# Patient Record
Sex: Male | Born: 1935
Health system: Southern US, Community
[De-identification: ages and names within clinical notes are randomized; demographics above are authoritative.]

## PROBLEM LIST (undated history)

## (undated) DIAGNOSIS — N183 Chronic kidney disease, stage 3 unspecified: Secondary | ICD-10-CM

## (undated) DIAGNOSIS — M1990 Unspecified osteoarthritis, unspecified site: Secondary | ICD-10-CM

## (undated) DIAGNOSIS — I10 Essential (primary) hypertension: Secondary | ICD-10-CM

## (undated) DIAGNOSIS — Z8719 Personal history of other diseases of the digestive system: Secondary | ICD-10-CM

## (undated) DIAGNOSIS — D509 Iron deficiency anemia, unspecified: Secondary | ICD-10-CM

## (undated) DIAGNOSIS — G4730 Sleep apnea, unspecified: Secondary | ICD-10-CM

## (undated) DIAGNOSIS — I701 Atherosclerosis of renal artery: Secondary | ICD-10-CM

## (undated) DIAGNOSIS — E7800 Pure hypercholesterolemia, unspecified: Secondary | ICD-10-CM

## (undated) DIAGNOSIS — J45909 Unspecified asthma, uncomplicated: Secondary | ICD-10-CM

## (undated) DIAGNOSIS — K219 Gastro-esophageal reflux disease without esophagitis: Secondary | ICD-10-CM

## (undated) DIAGNOSIS — I350 Nonrheumatic aortic (valve) stenosis: Secondary | ICD-10-CM

## (undated) DIAGNOSIS — I480 Paroxysmal atrial fibrillation: Secondary | ICD-10-CM

## (undated) DIAGNOSIS — I2510 Atherosclerotic heart disease of native coronary artery without angina pectoris: Secondary | ICD-10-CM

## (undated) HISTORY — DX: Chronic kidney disease, stage 3 (moderate): N18.3

## (undated) HISTORY — PX: INGUINAL HERNIA REPAIR: SUR1180

## (undated) HISTORY — DX: Chronic kidney disease, stage 3 unspecified: N18.30

## (undated) HISTORY — DX: Atherosclerosis of renal artery: I70.1

## (undated) HISTORY — PX: CATARACT EXTRACTION W/ INTRAOCULAR LENS IMPLANT: SHX1309

## (undated) HISTORY — DX: Unspecified asthma, uncomplicated: J45.909

## (undated) HISTORY — PX: CORONARY ANGIOPLASTY: SHX604

## (undated) HISTORY — DX: Paroxysmal atrial fibrillation: I48.0

## (undated) HISTORY — PX: TONSILLECTOMY AND ADENOIDECTOMY: SUR1326

---

## 1992-05-11 HISTORY — PX: CORONARY ARTERY BYPASS GRAFT: SHX141

## 1997-11-21 ENCOUNTER — Ambulatory Visit (HOSPITAL_COMMUNITY): Admission: RE | Admit: 1997-11-21 | Discharge: 1997-11-21 | Payer: Self-pay | Admitting: Gastroenterology

## 2001-11-04 ENCOUNTER — Ambulatory Visit (HOSPITAL_COMMUNITY): Admission: RE | Admit: 2001-11-04 | Discharge: 2001-11-04 | Payer: Self-pay | Admitting: Gastroenterology

## 2004-02-27 HISTORY — PX: CARDIAC CATHETERIZATION: SHX172

## 2004-03-04 ENCOUNTER — Ambulatory Visit (HOSPITAL_COMMUNITY): Admission: RE | Admit: 2004-03-04 | Discharge: 2004-03-05 | Payer: Self-pay | Admitting: Cardiovascular Disease

## 2004-03-04 HISTORY — PX: CORONARY ANGIOPLASTY WITH STENT PLACEMENT: SHX49

## 2004-09-11 ENCOUNTER — Emergency Department (HOSPITAL_COMMUNITY): Admission: EM | Admit: 2004-09-11 | Discharge: 2004-09-12 | Payer: Self-pay | Admitting: Emergency Medicine

## 2004-09-11 IMAGING — CR DG ANKLE COMPLETE 3+V*L*
3 series · 3 of 3 positions shown · non-contrast
Comparison: none

CLINICAL DATA: Twisted ankle. Pain.
 LEFT [J4] VIEW:
 There is some mild soft tissue swelling about the ankle.  No fracture, dislocation or joint effusion.  Atherosclerotic calcifications are noted. Well formed plantar calcaneal spur.

[view not recorded (1 of 3)]
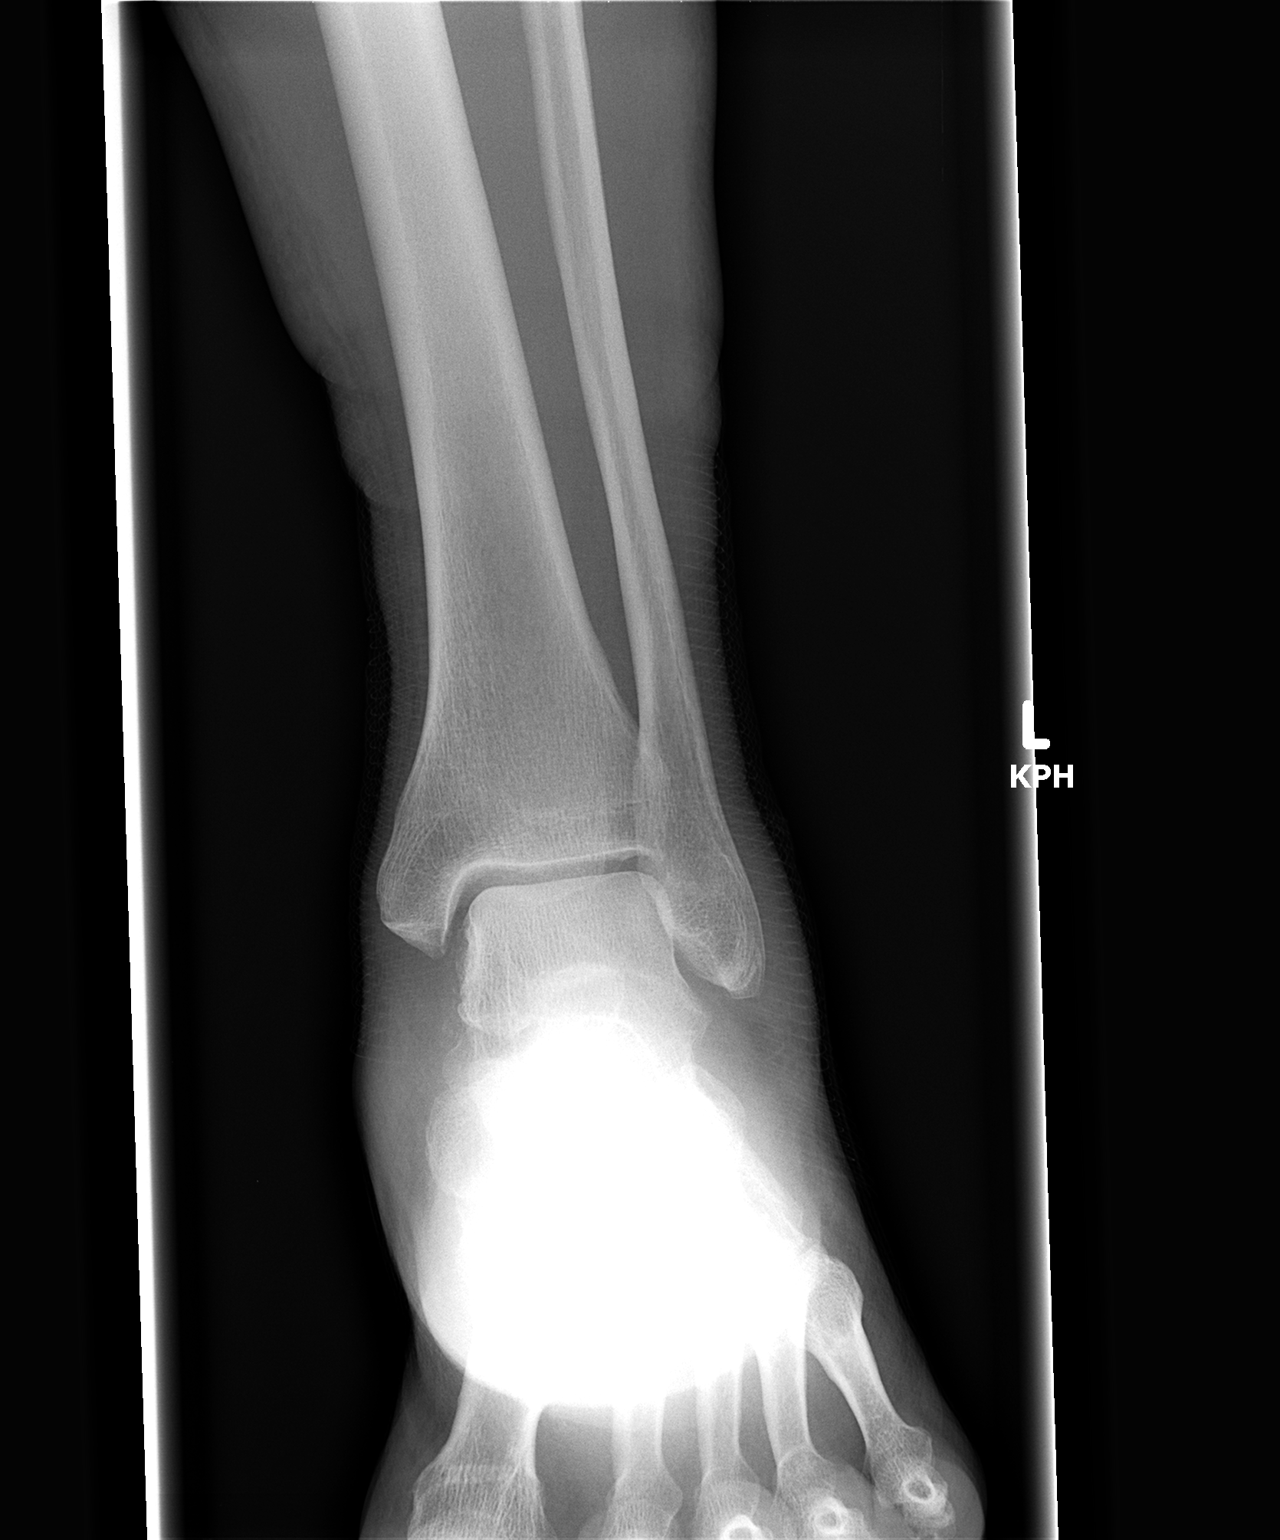

[view not recorded (2 of 3)]
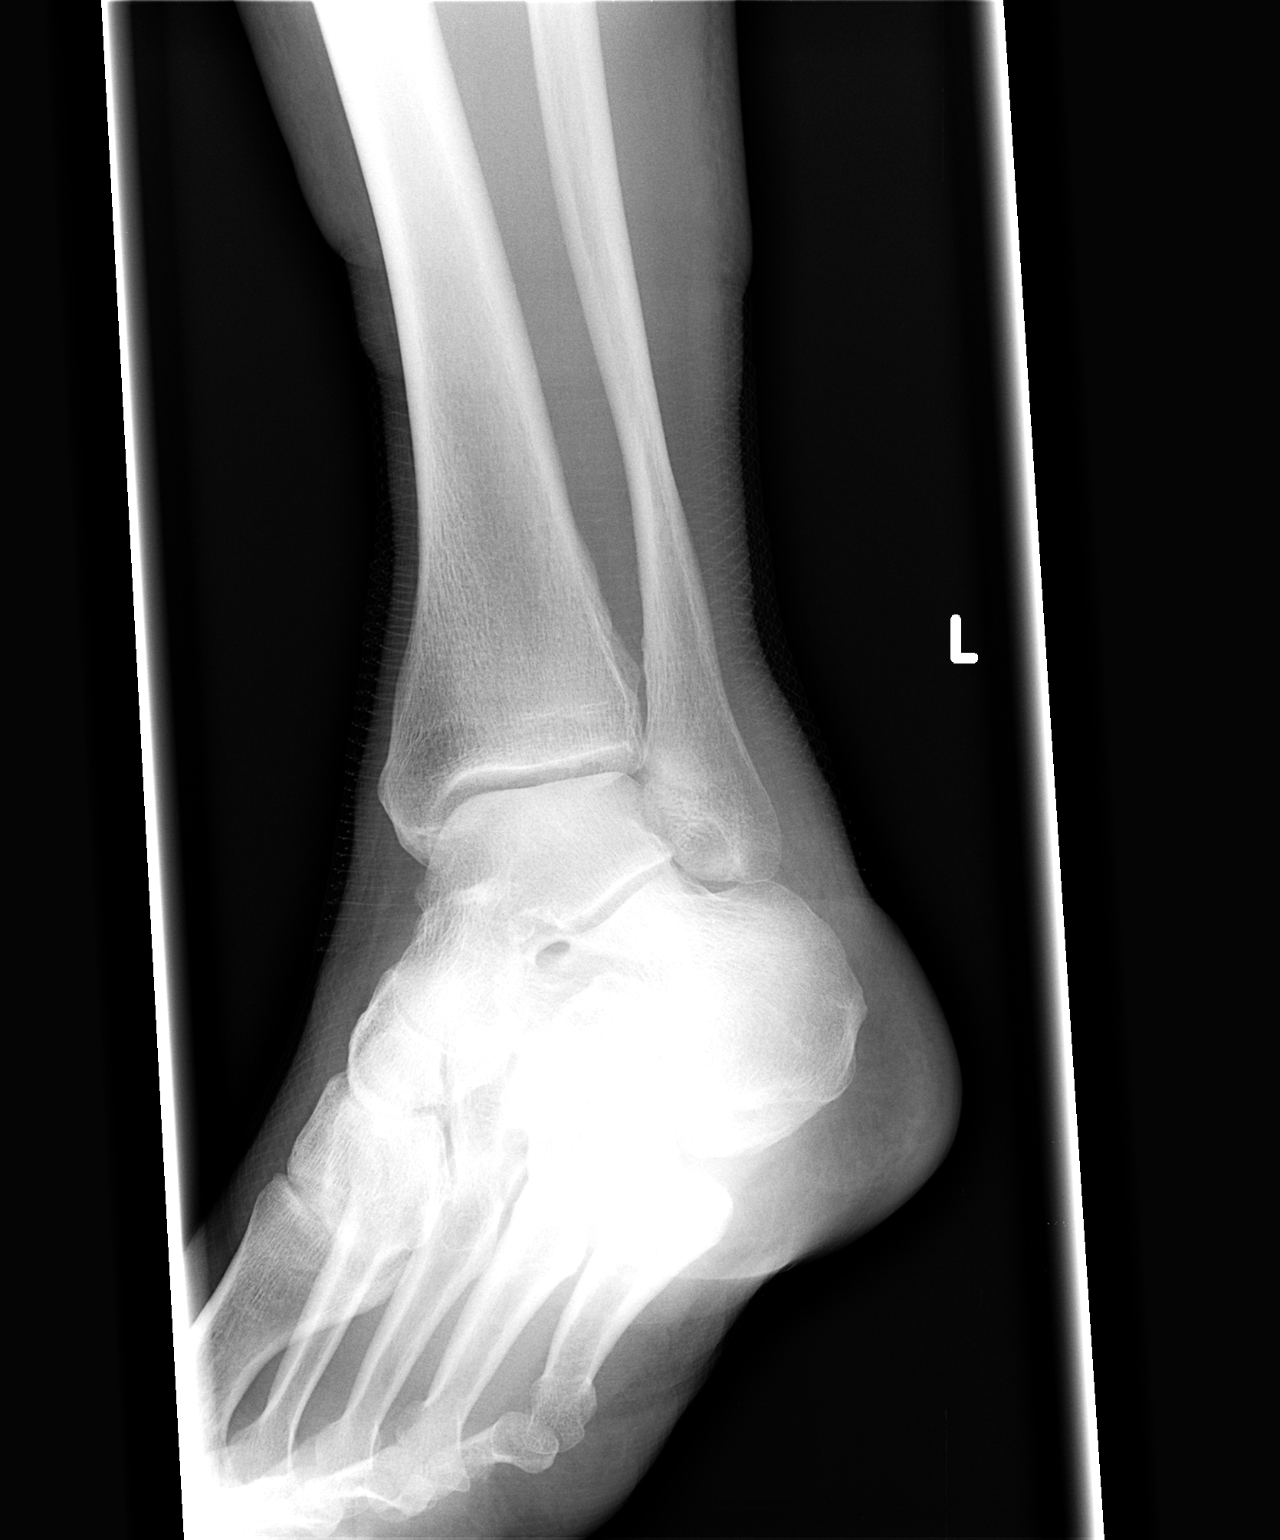

[view not recorded (3 of 3)]
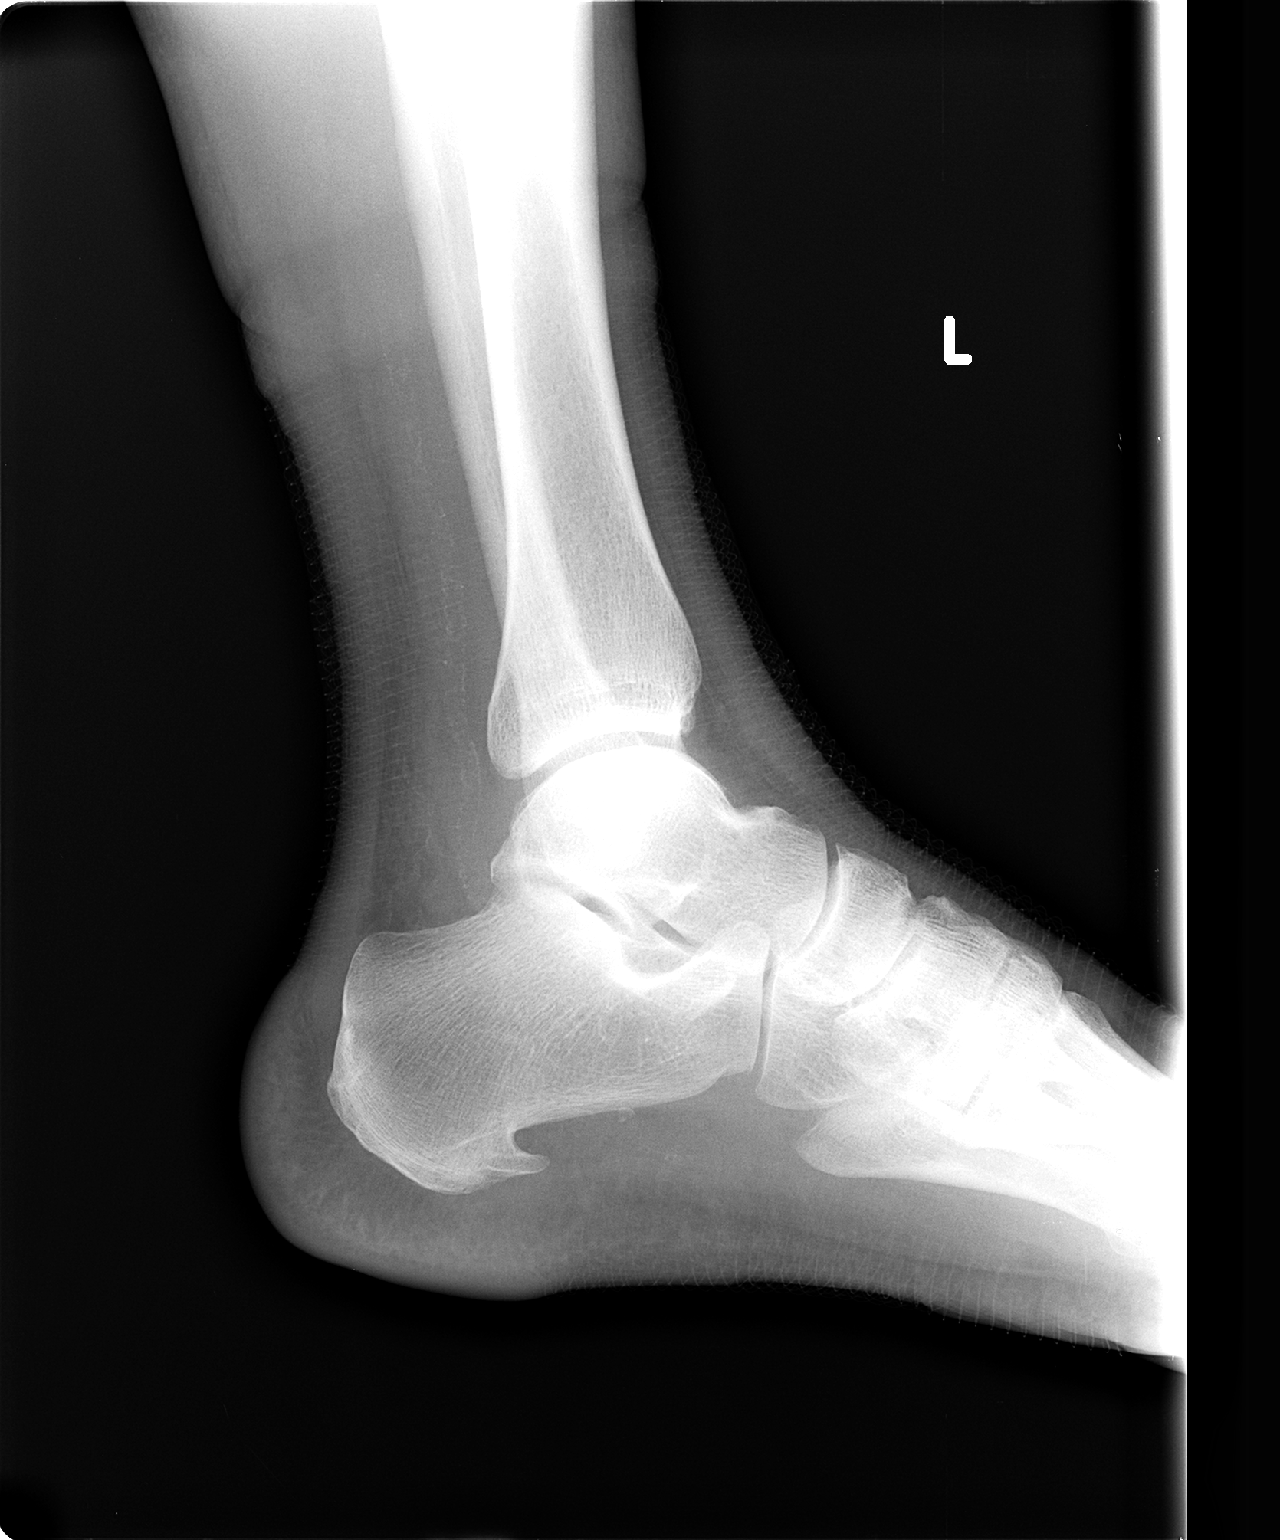

[3 of 3 positions shown; findings below may reference images not displayed]

IMPRESSION: Soft tissue swelling about the ankle without underlying fracture.

## 2005-02-07 IMAGING — CR DG CHEST 2V
2 series · 2 of 2 positions shown · non-contrast
Comparison: [DATE].

CLINICAL DATA: Preop respiratory exam for right inguinal hernia repair.  History of open heart surgery.  Hypertension.
 TWO VIEW CHEST:

[w chest pa]
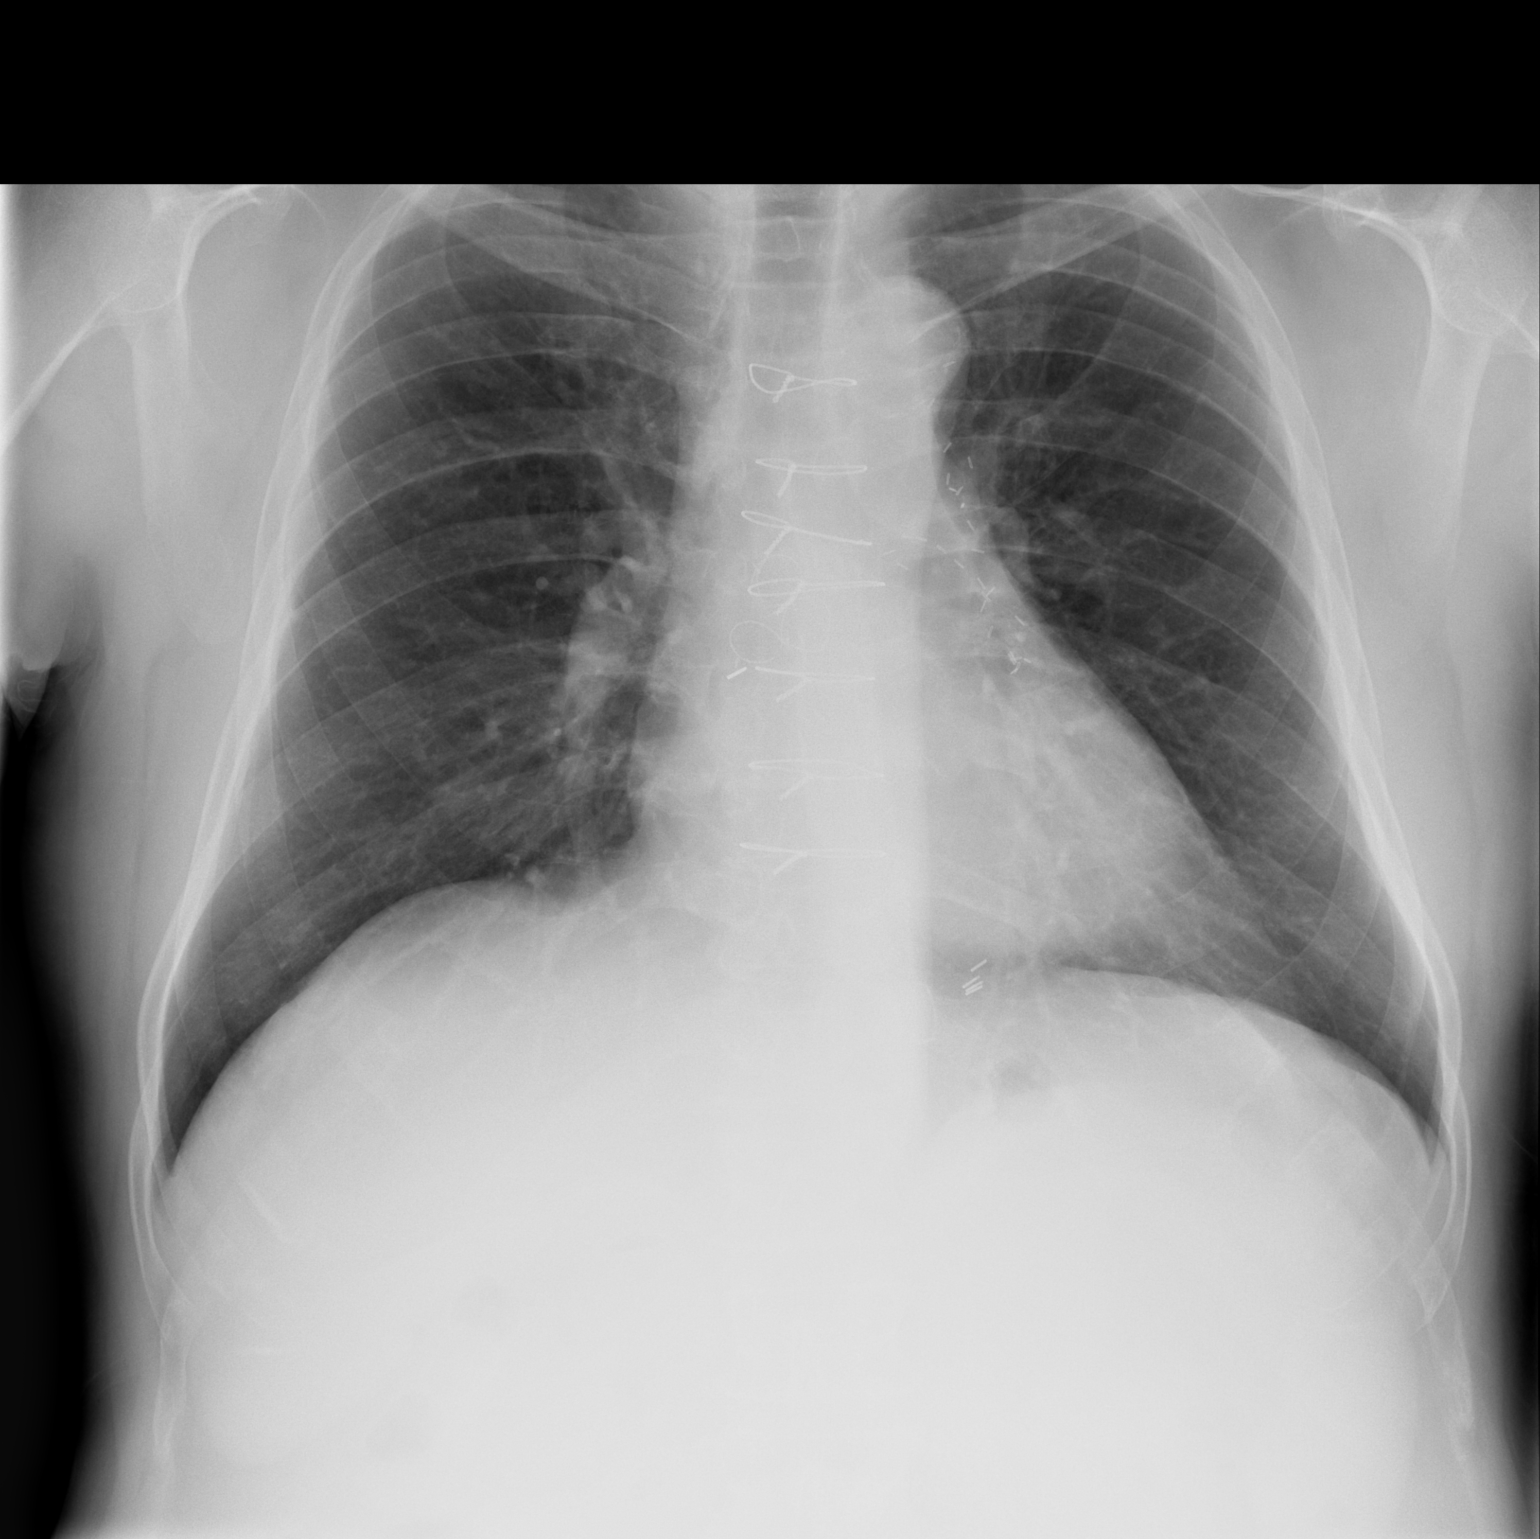

[w chest lat]
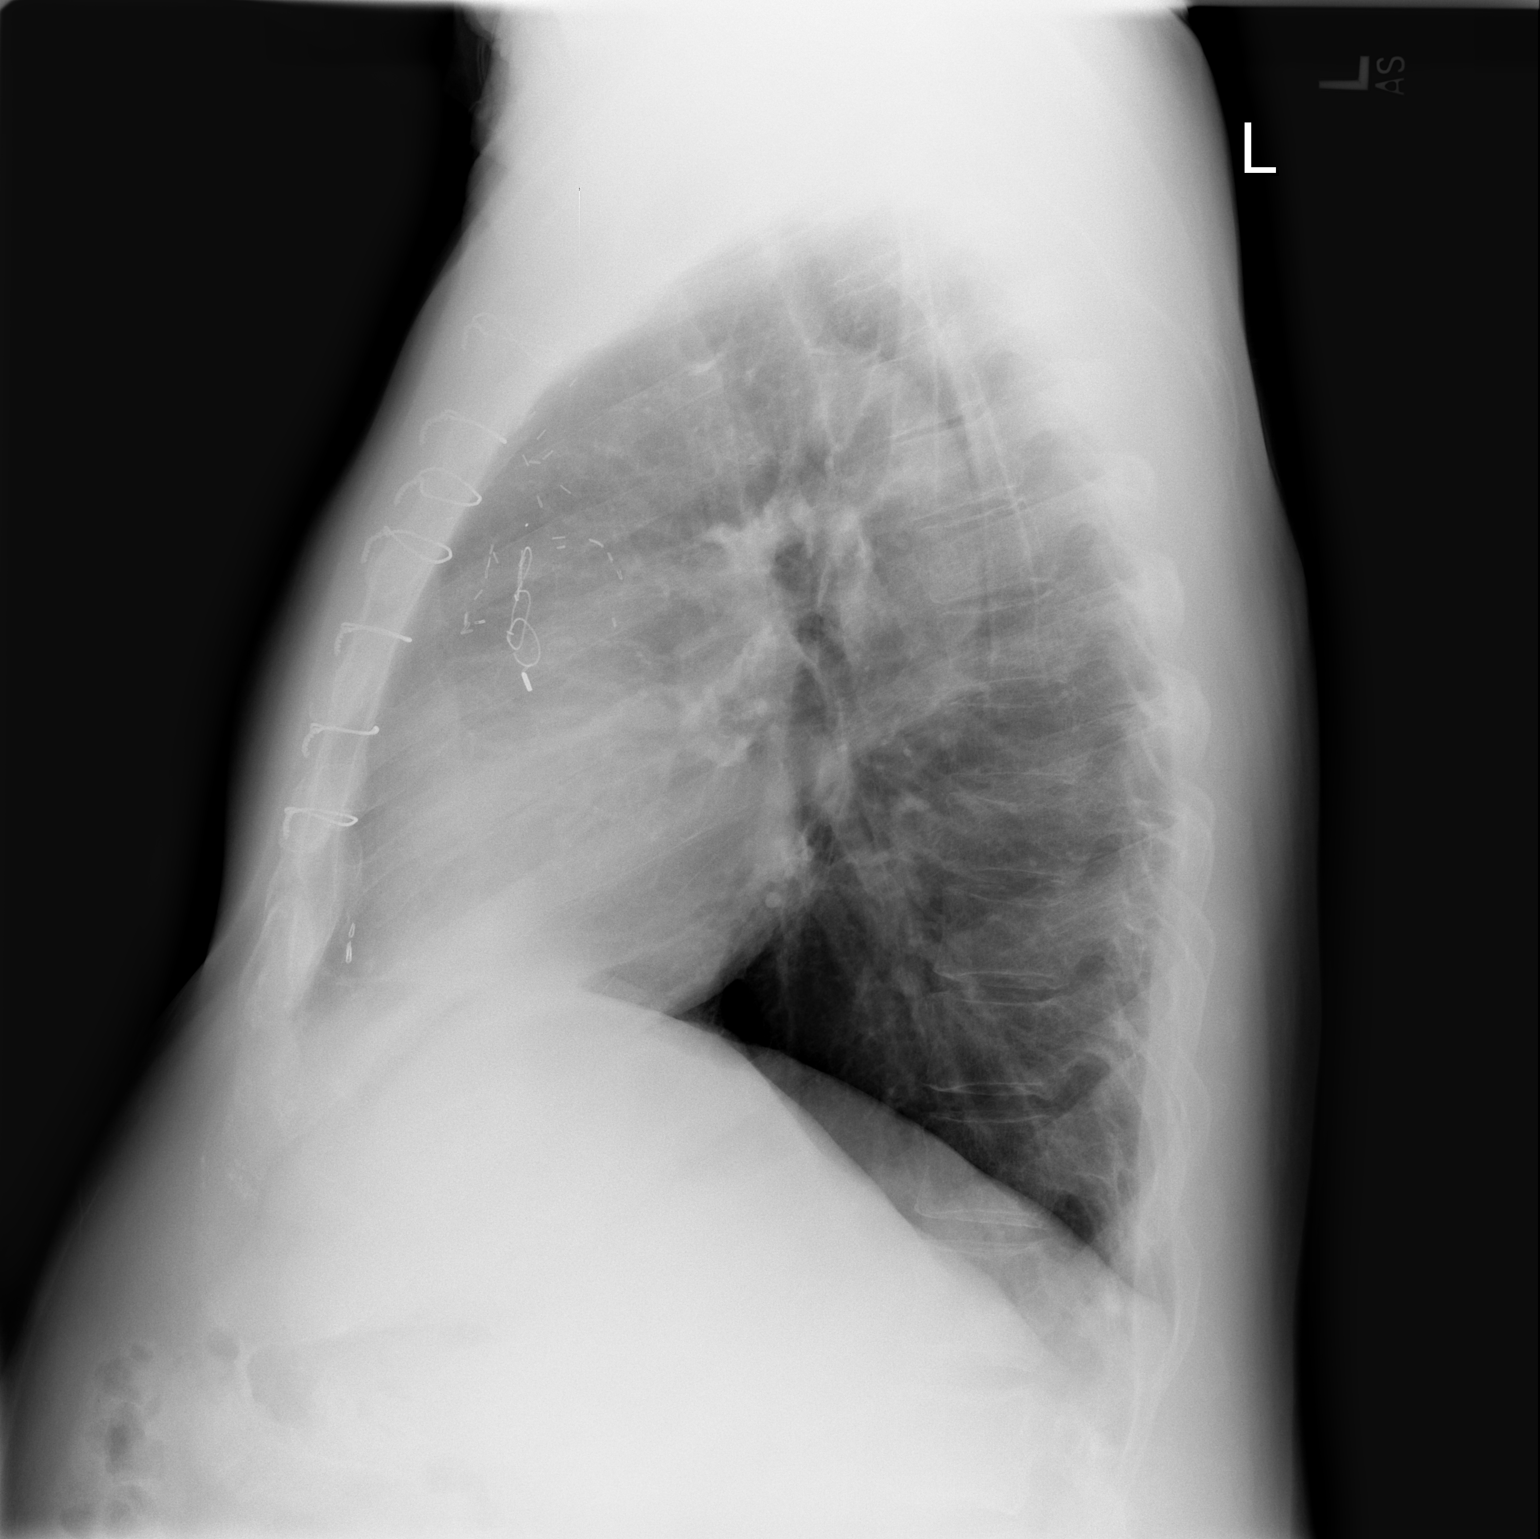

[2 of 2 positions shown; findings below may reference images not displayed]

FINDINGS: Midline trachea.  Heart upper limits of normal for size.  Status post median sternotomy and CABG.  Costophrenic angles are sharp.  The lungs are clear.
IMPRESSION: No acute cardiopulmonary disease.

## 2005-09-30 ENCOUNTER — Emergency Department (HOSPITAL_COMMUNITY): Admission: EM | Admit: 2005-09-30 | Discharge: 2005-10-01 | Payer: Self-pay | Admitting: *Deleted

## 2006-04-19 ENCOUNTER — Encounter: Admission: RE | Admit: 2006-04-19 | Discharge: 2006-04-19 | Payer: Self-pay | Admitting: General Surgery

## 2006-04-20 ENCOUNTER — Ambulatory Visit (HOSPITAL_BASED_OUTPATIENT_CLINIC_OR_DEPARTMENT_OTHER): Admission: RE | Admit: 2006-04-20 | Discharge: 2006-04-20 | Payer: Self-pay | Admitting: General Surgery

## 2006-05-24 ENCOUNTER — Encounter: Admission: RE | Admit: 2006-05-24 | Discharge: 2006-05-24 | Payer: Self-pay | Admitting: Internal Medicine

## 2006-05-24 IMAGING — CR DG PELVIS 1-2V
1 series · 1 of 1 positions shown · non-contrast
Comparison: none

CLINICAL DATA: Pain 
 LEFT HIP - 2 VIEW:

[t pelvis a.p.]
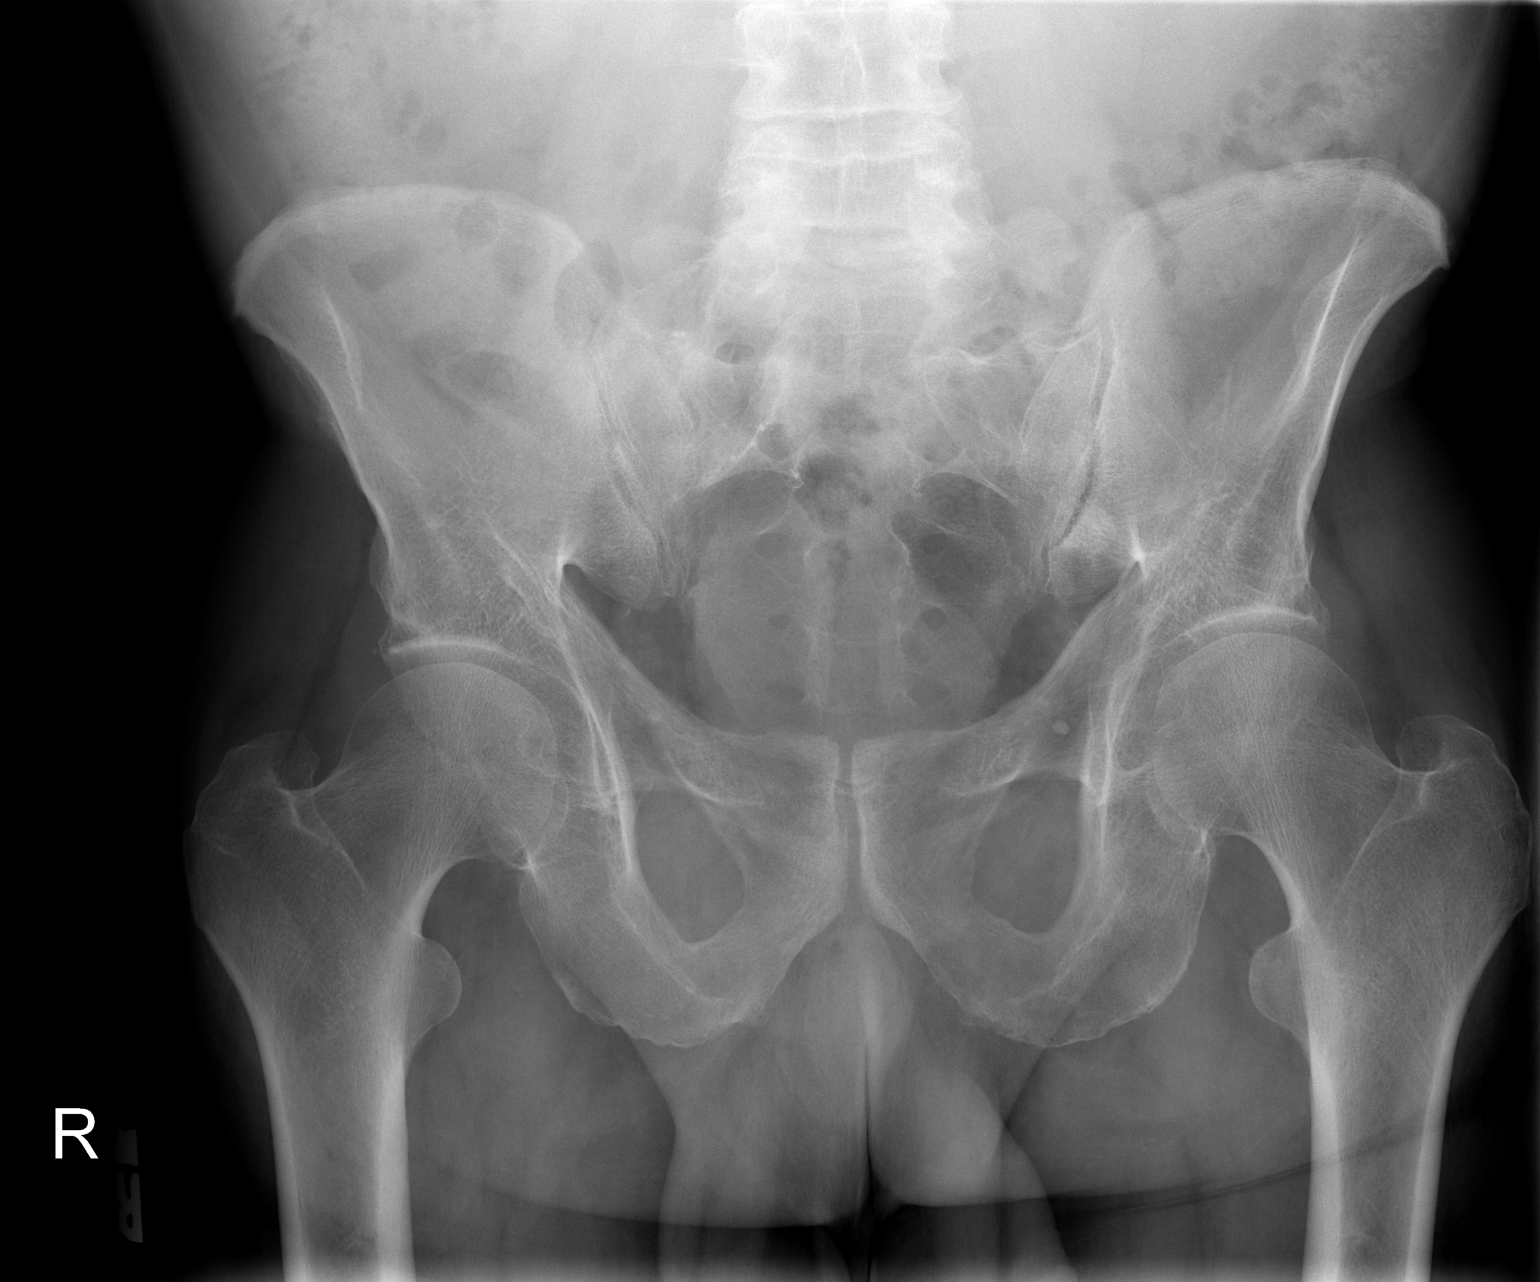

[1 of 1 positions shown; findings below may reference images not displayed]

FINDINGS: Two views of the left hip were obtained.  The left hip joint space is within normal limits for age and no acute bony abnormality is seen.  The bones are somewhat osteopenic.
IMPRESSION: No acute abnormality.

 PELVIS - 1 VIEW:
FINDINGS: A single view of the pelvis shows the bones to be osteopenic.  Both hip joint spaces are normal.  The pelvic rami are intact, and the SI joints appear normal.
IMPRESSION: No acute bony abnormality.  Degenerative change in the lower lumbar spine.

## 2006-05-24 IMAGING — CR DG HIP (WITH OR WITHOUT PELVIS) 2-3V*L*
2 series · 2 of 2 positions shown · non-contrast
Comparison: none

CLINICAL DATA: Pain 
 LEFT HIP - 2 VIEW:

[t hip ap left]
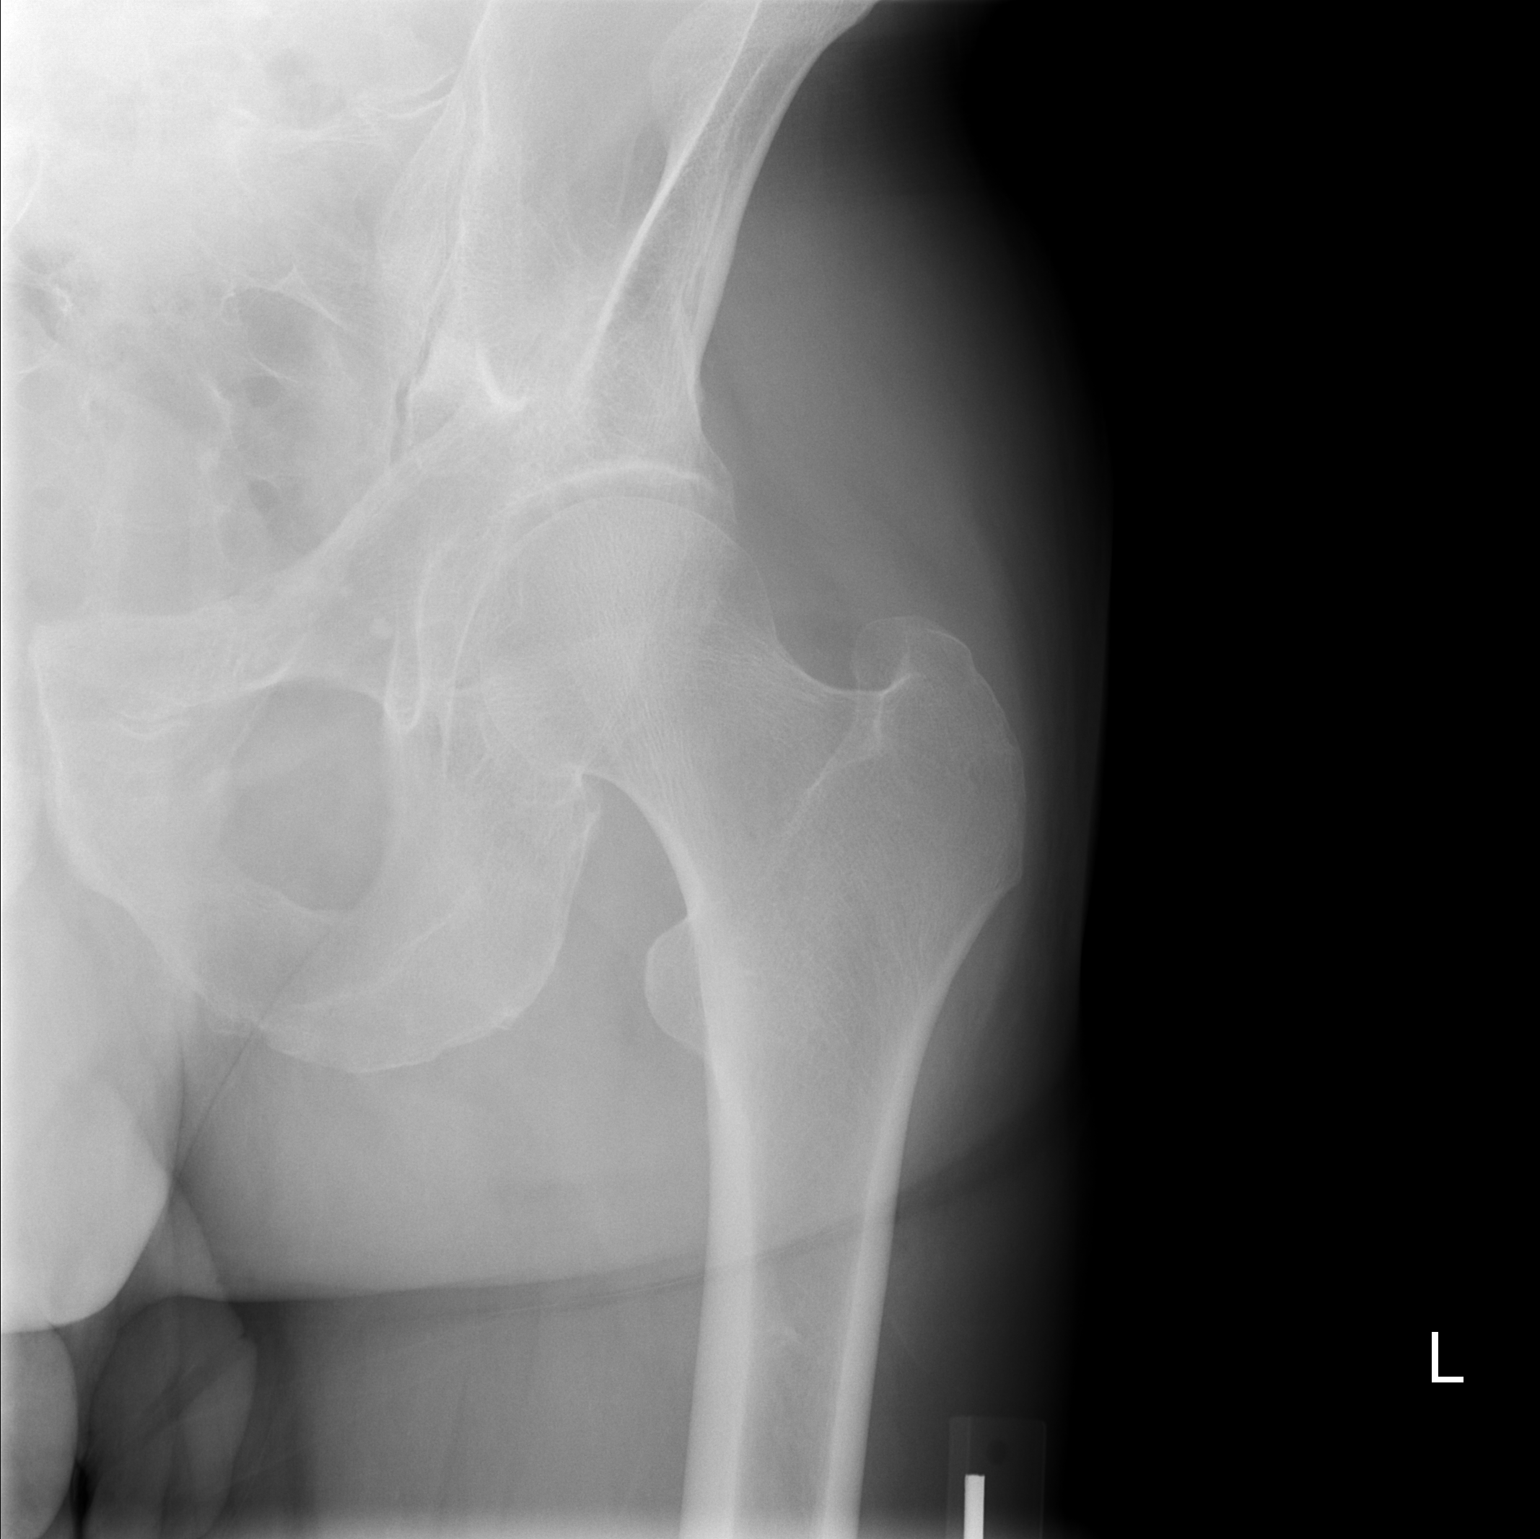

[t hip frog leg left]
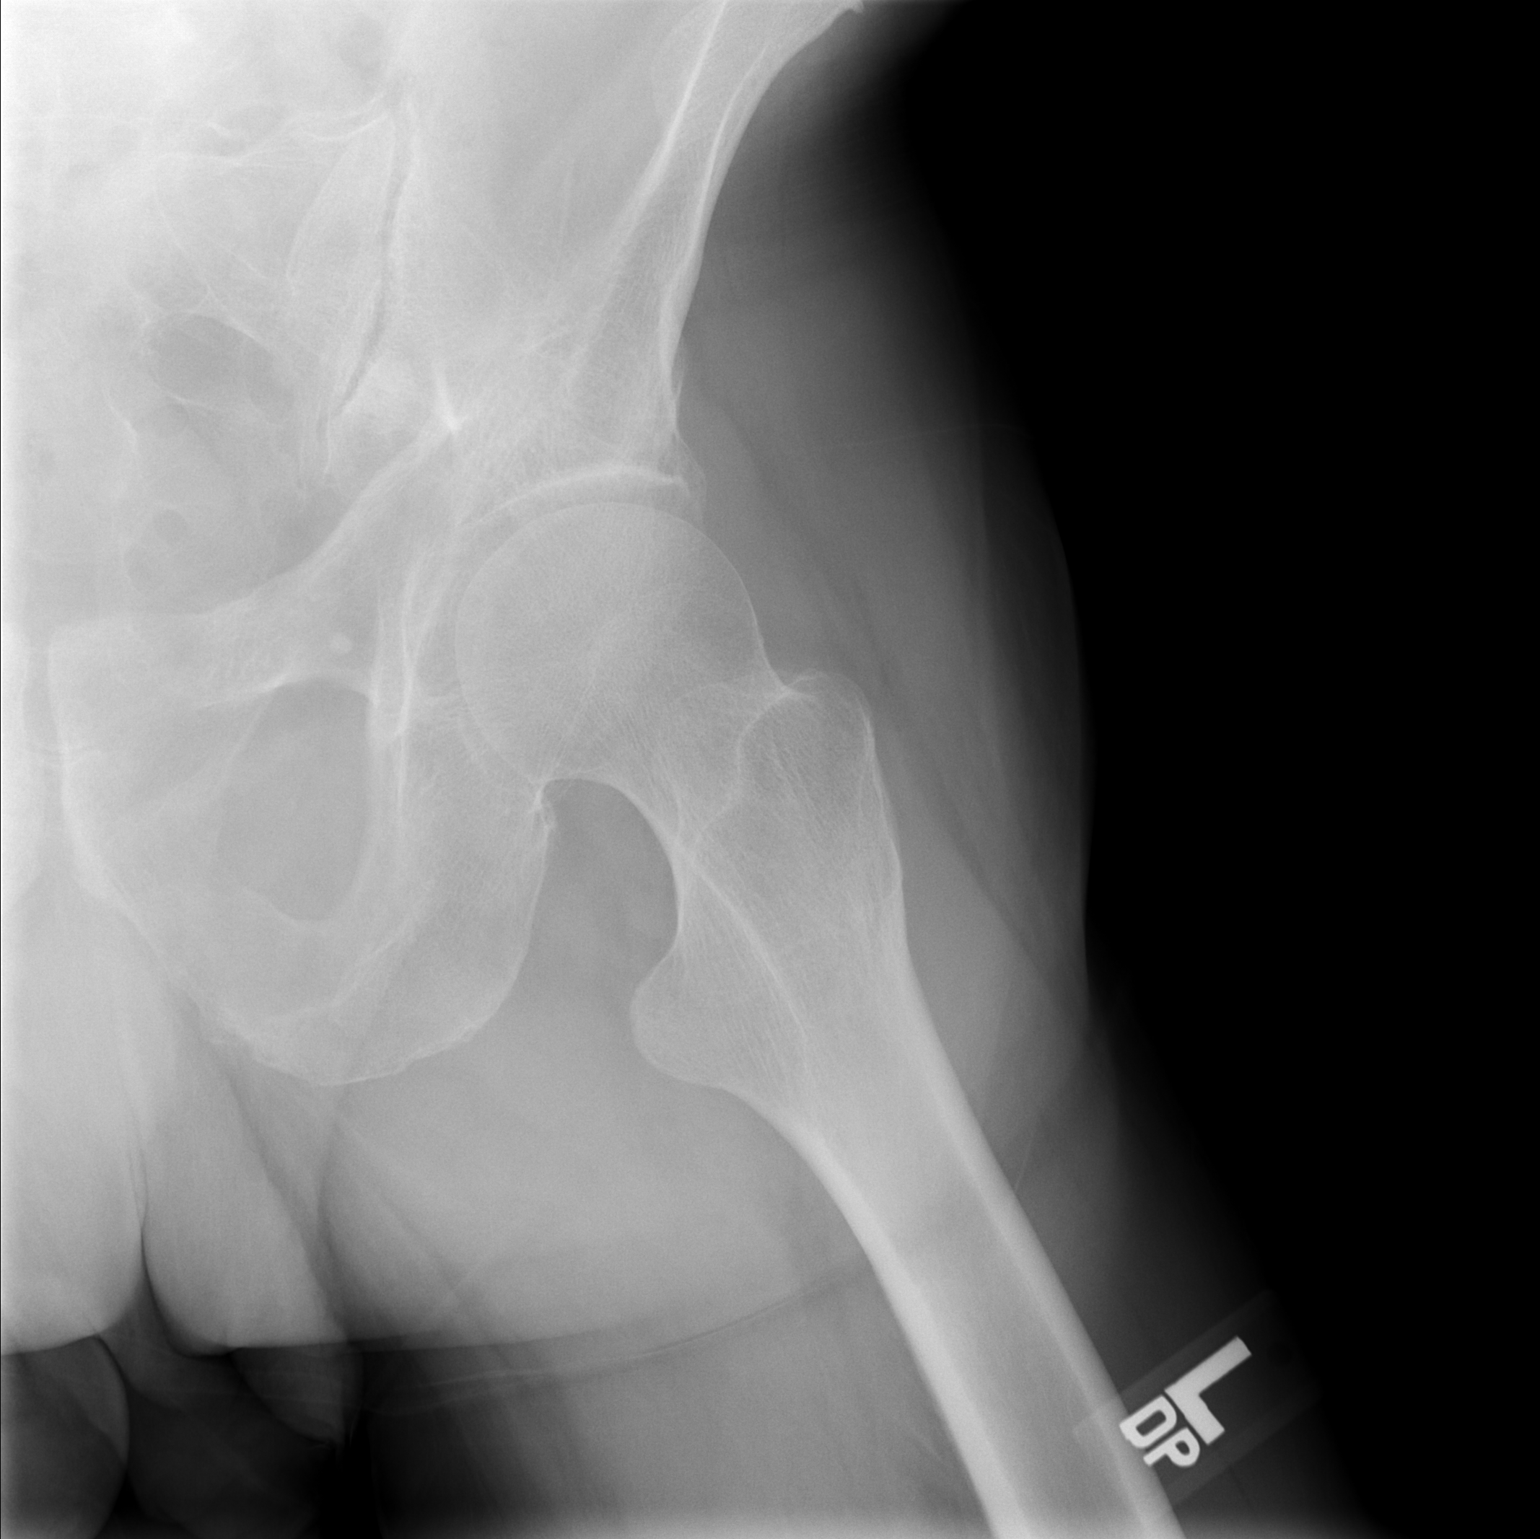

[2 of 2 positions shown; findings below may reference images not displayed]

FINDINGS: Two views of the left hip were obtained.  The left hip joint space is within normal limits for age and no acute bony abnormality is seen.  The bones are somewhat osteopenic.
IMPRESSION: No acute abnormality.

 PELVIS - 1 VIEW:
FINDINGS: A single view of the pelvis shows the bones to be osteopenic.  Both hip joint spaces are normal.  The pelvic rami are intact, and the SI joints appear normal.
IMPRESSION: No acute bony abnormality.  Degenerative change in the lower lumbar spine.

## 2009-05-23 ENCOUNTER — Observation Stay (HOSPITAL_COMMUNITY): Admission: EM | Admit: 2009-05-23 | Discharge: 2009-05-25 | Payer: Self-pay | Admitting: Emergency Medicine

## 2009-05-23 IMAGING — CT CT HEAD W/O CM
4 of 5 series · 16 of 47 positions shown, 18 images · non-contrast
Comparison: None.

CT HEAD

CLINICAL DATA: Fell and hit head.  Nausea.

CT HEAD WITHOUT CONTRAST
CT CERVICAL SPINE WITHOUT CONTRAST
TECHNIQUE: Multidetector CT imaging of the head and cervical spine
was performed following the standard protocol without intravenous
contrast.  Multiplanar CT image reconstructions of the cervical
spine were also generated.

[Series 3: head trauma 4.8 h37s · axial · 0.47mm/px · z∈[-114,-65]mm · 2 of 30 slices shown]
[im 10/30  brain]
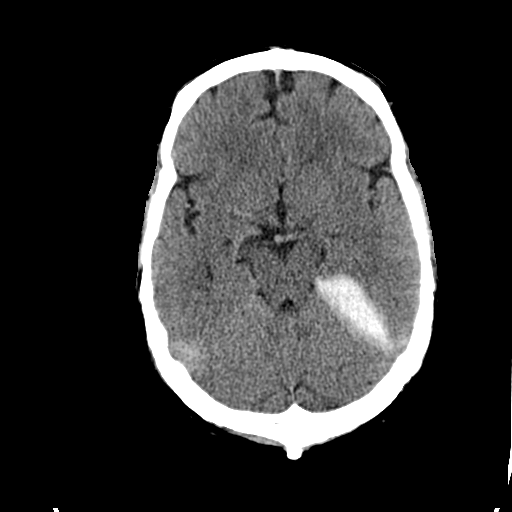
[im 20/30  brain]
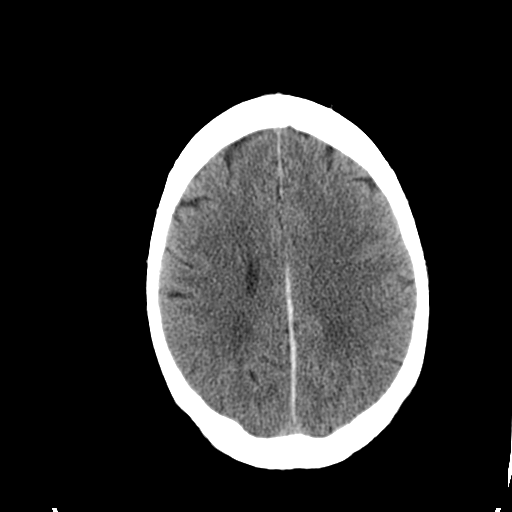

[Series 4: head trauma 2.4 h60s · axial · 0.47mm/px · z∈[-145,-32]mm · 8 of 60 slices shown, 10 images]
[im 7/60  brain]
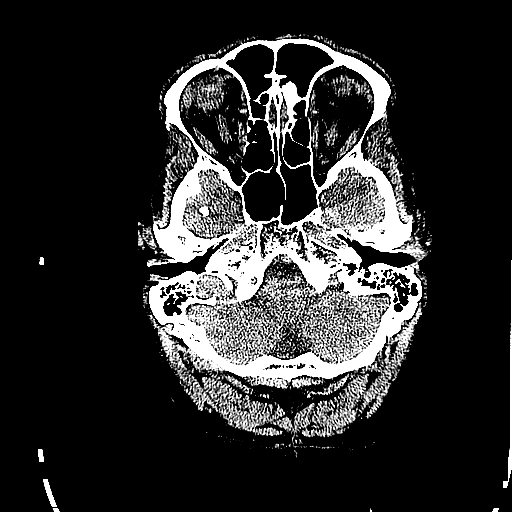
[im 7/60  bone]
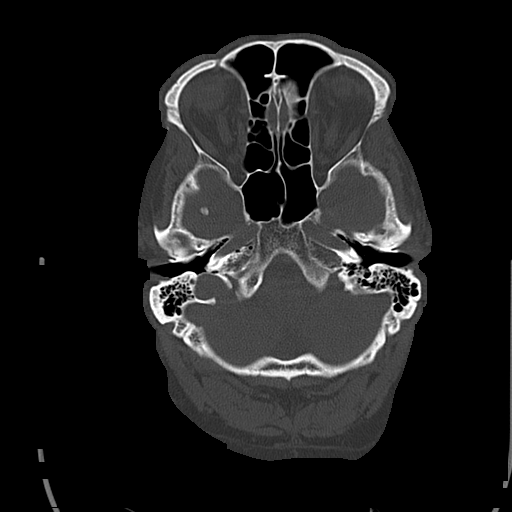
[im 14/60  brain]
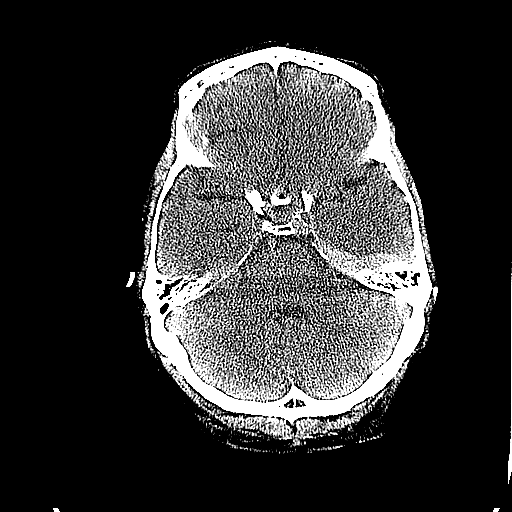
[im 20/60  brain]
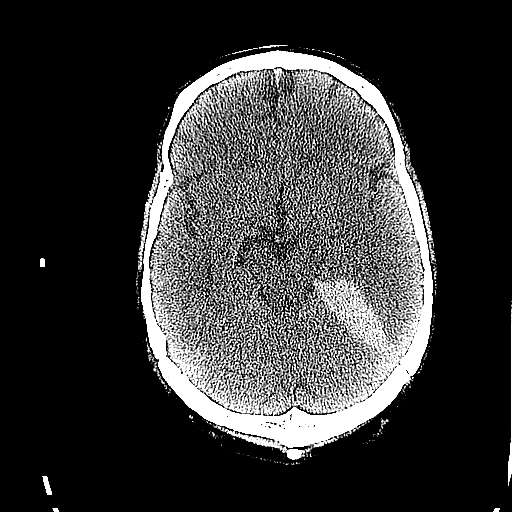
[im 27/60  brain]
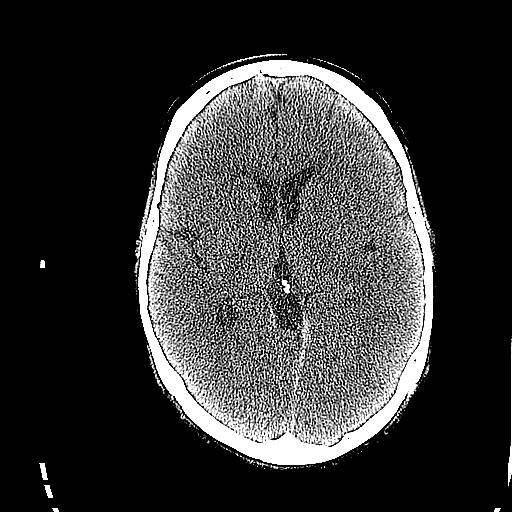
[im 33/60  brain]
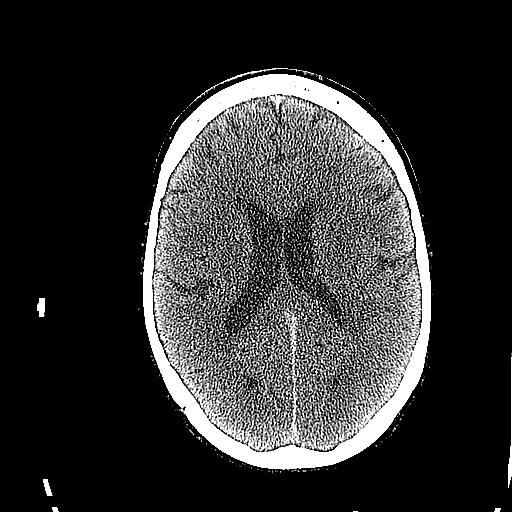
[im 33/60  bone]
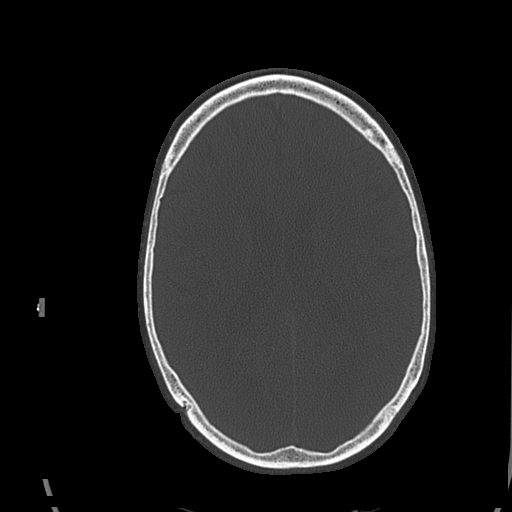
[im 40/60  brain]
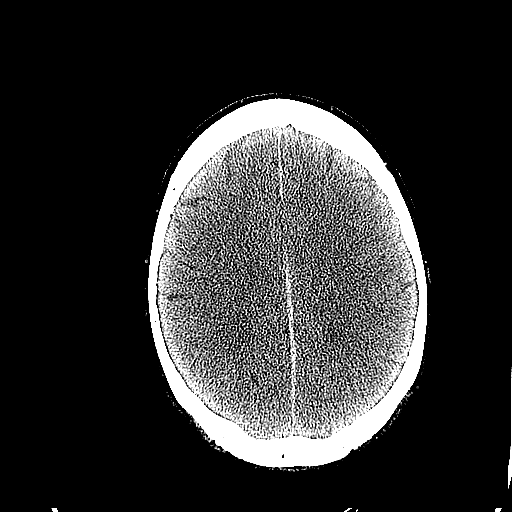
[im 46/60  brain]
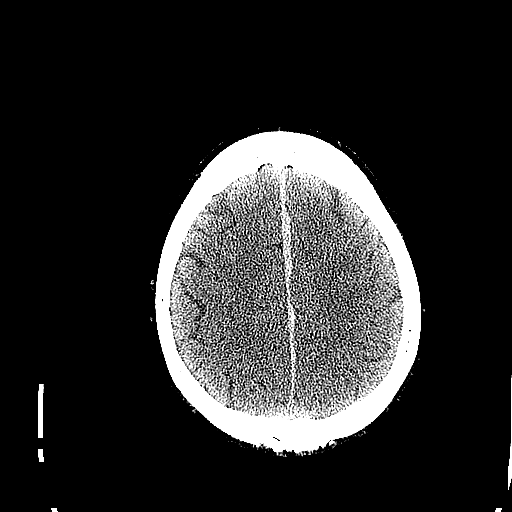
[im 53/60  brain]
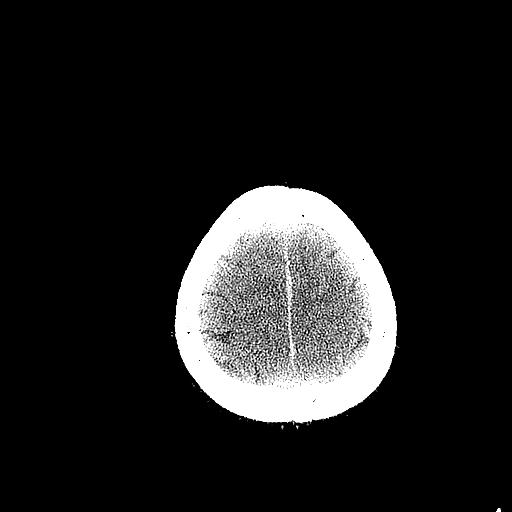

[Series 602: cor bone · coronal · 0.38mm/px · 3 of 55 slices shown]
[im 19/55  brain]
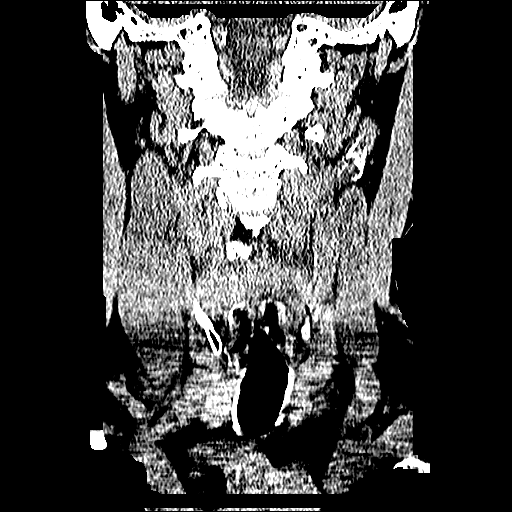
[im 25/55  brain]
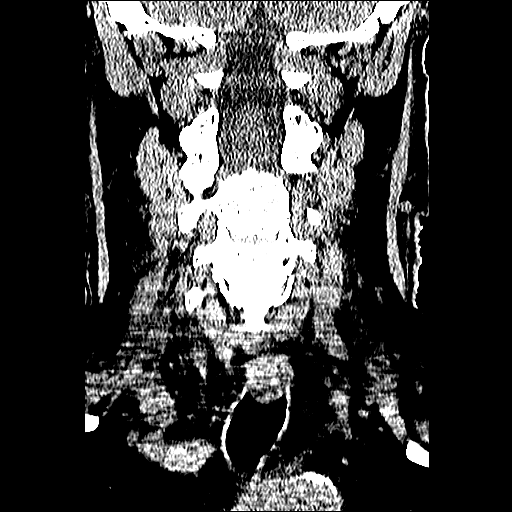
[im 31/55  brain]
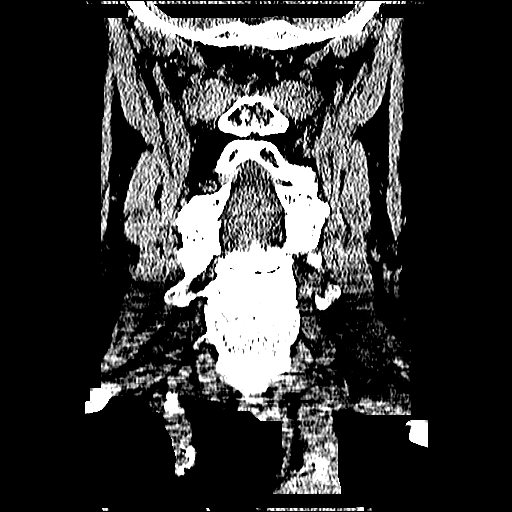

[Series 603: sag bone · sagittal · 0.38mm/px · 3 of 61 slices shown]
[im 21/61  brain]
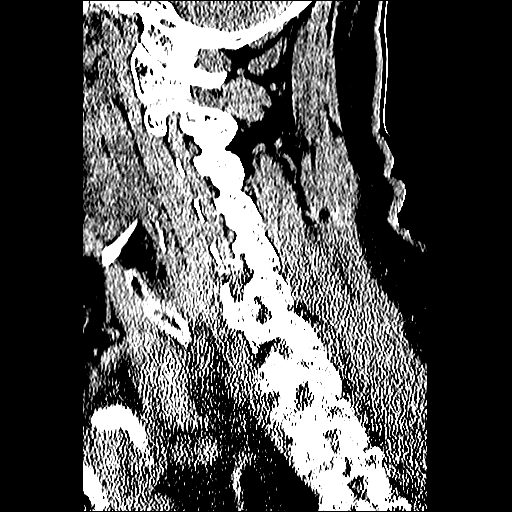
[im 31/61  brain]
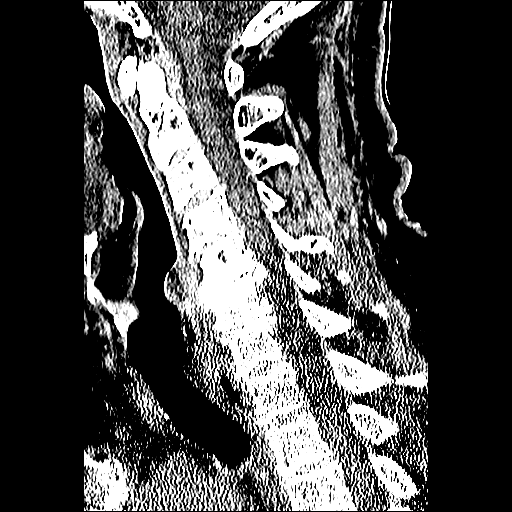
[im 41/61  brain]
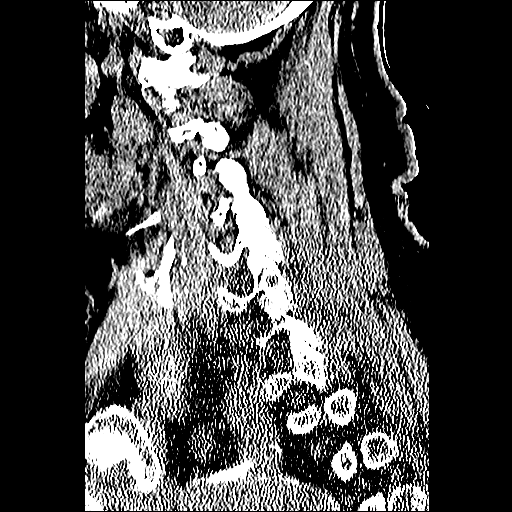

[16 of 47 positions shown; findings below may reference images not displayed]

FINDINGS: Left peritentorial extra-axial hematoma compatible with a
subdural hematoma.  Thin subdural hematoma in the interhemispheric
fissure superiorly.  No midline shift.  No parenchymal hemorrhage.
Chronic small vessel disease of the cerebral deep white matter.
Bony calvarium is intact.
IMPRESSION: Left peritentorial SDH.  SDH in the interhemispheric fissure.

CT CERVICAL SPINE
FINDINGS: No fracture or subluxation.  Advanced degenerative disc
disease at C5-6. Corticated bony density paracentrally on the left
at C5-6 related to spondylosis. Less severe DDD at C6-7.
IMPRESSION: No fracture/subluxation.  Spondylosis.

## 2009-05-24 IMAGING — CT CT HEAD W/O CM
1 of 2 series · 13 of 30 positions shown, 17 images · non-contrast
Comparison: [DATE]

CLINICAL DATA: Follow-up subdural hematoma

CT HEAD WITHOUT CONTRAST
TECHNIQUE: Contiguous axial images were obtained from the base of
the skull through the vertex without contrast.

[Series 2: brain · axial · 0.47mm/px · z∈[+142,+266]mm · 13 of 28 slices shown, 17 images]
[im 2/28  brain]
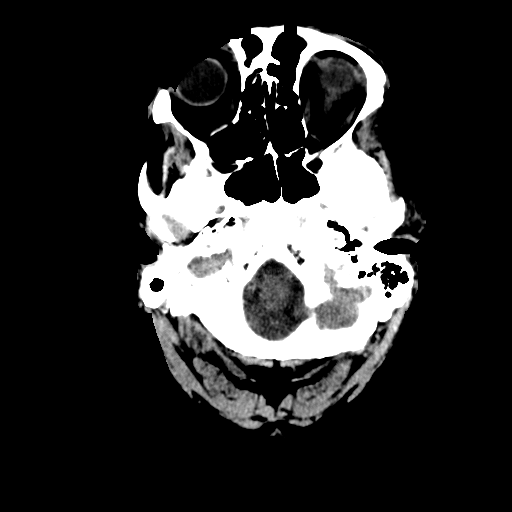
[im 2/28  bone]
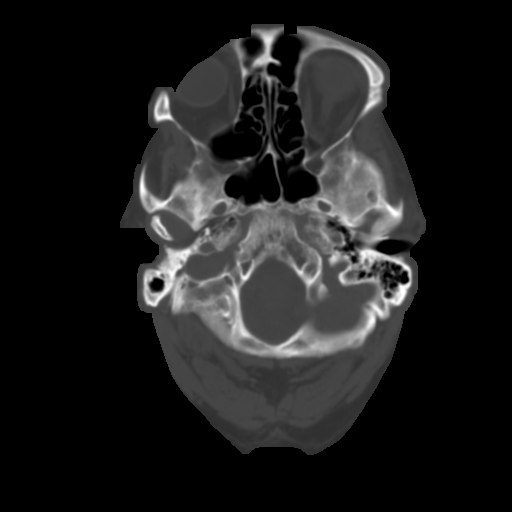
[im 4/28  brain]
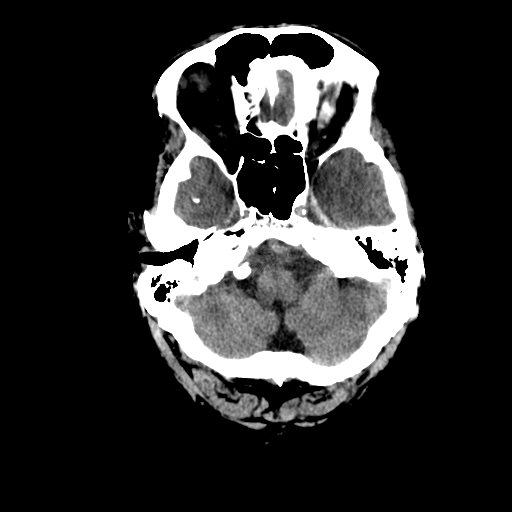
[im 6/28  brain]
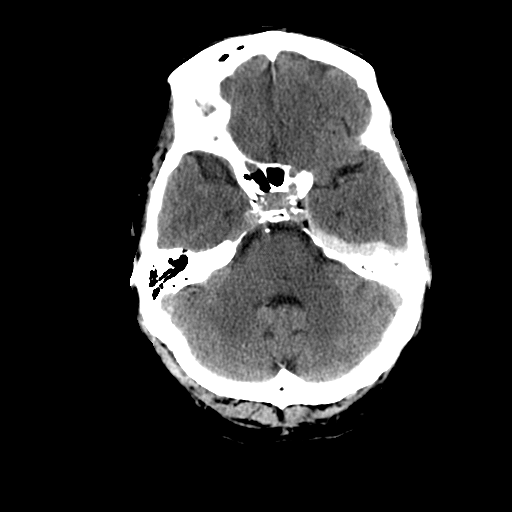
[im 8/28  brain]
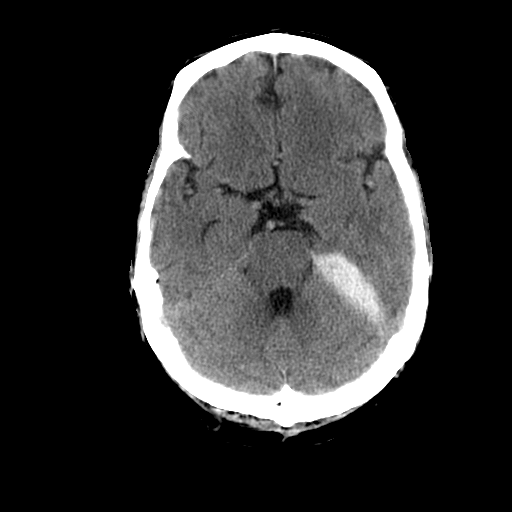
[im 10/28  brain]
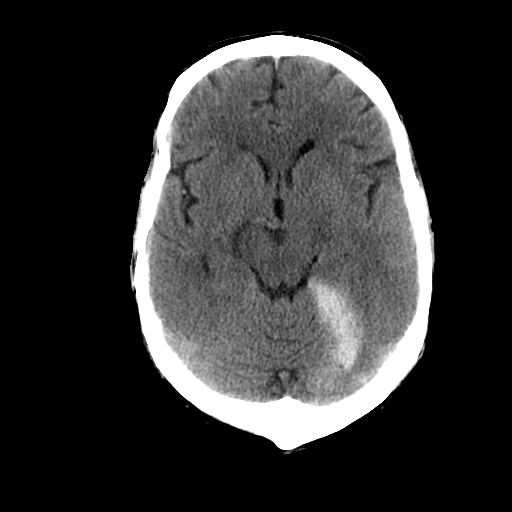
[im 10/28  bone]
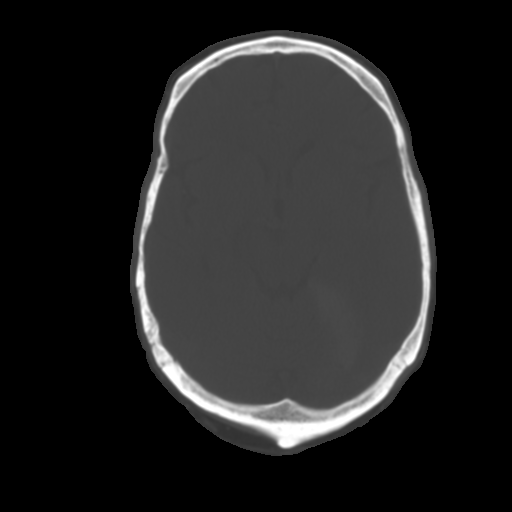
[im 12/28  brain]
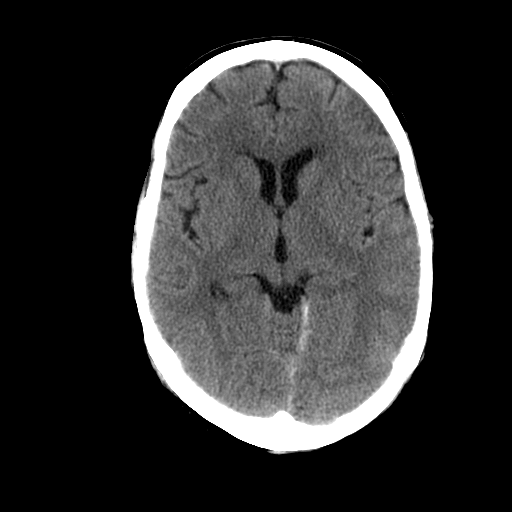
[im 14/28  brain]
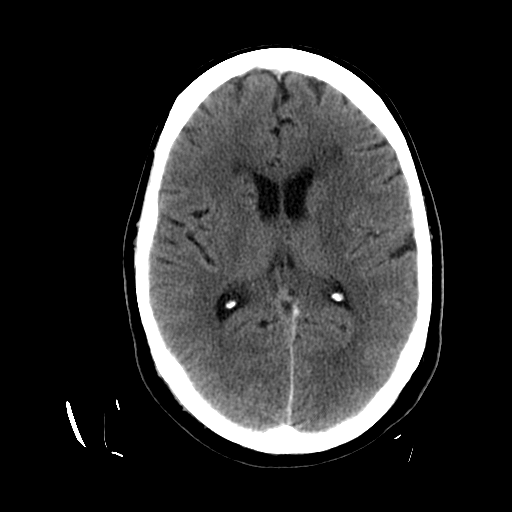
[im 16/28  brain]
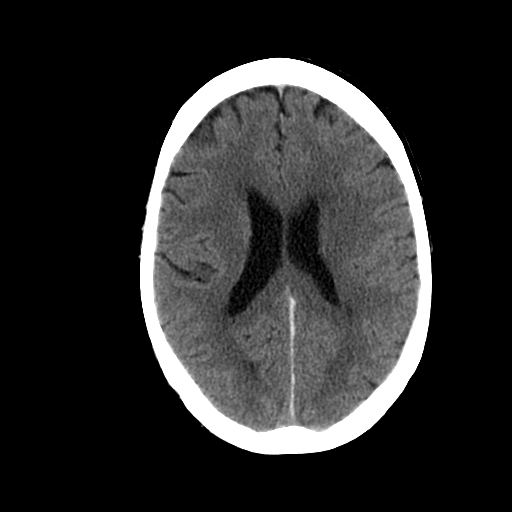
[im 18/28  brain]
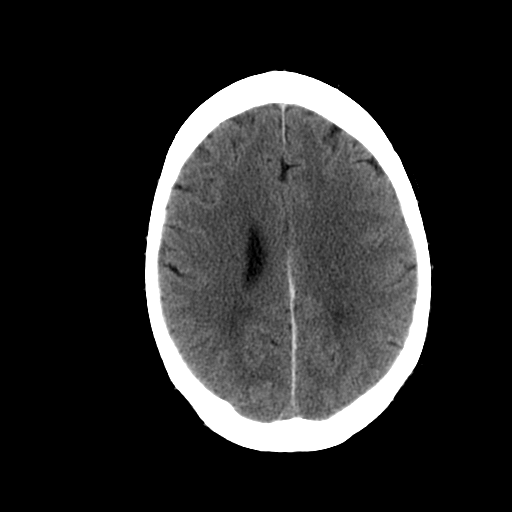
[im 18/28  bone]
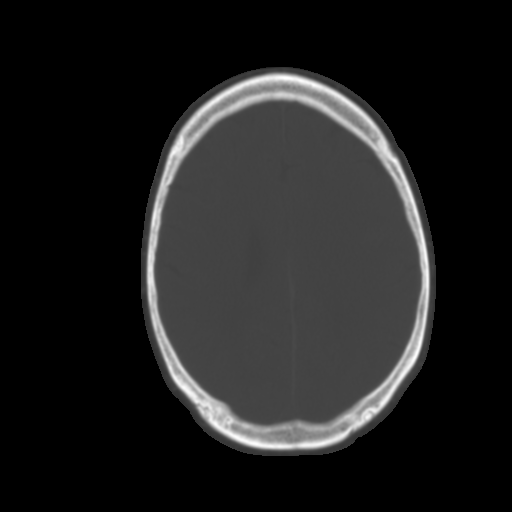
[im 20/28  brain]
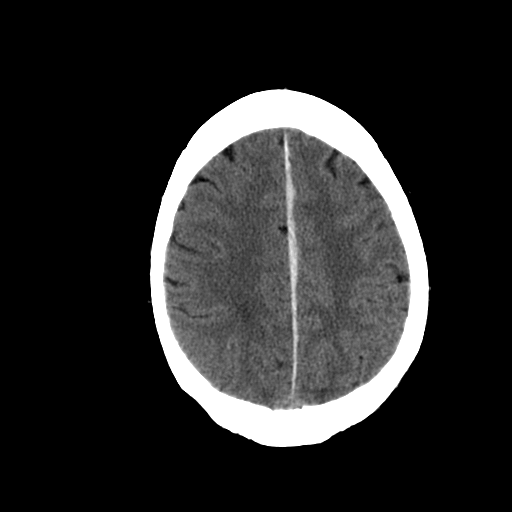
[im 22/28  brain]
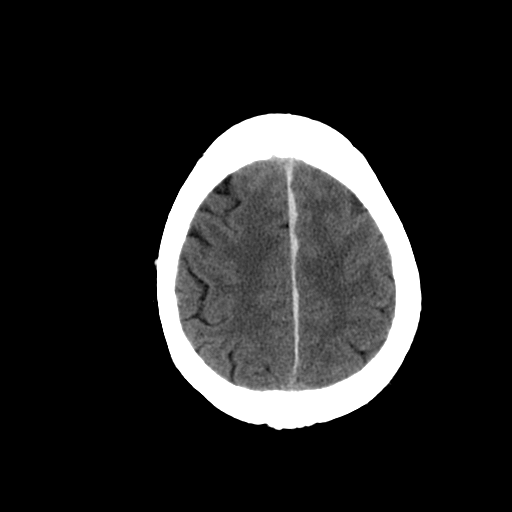
[im 24/28  brain]
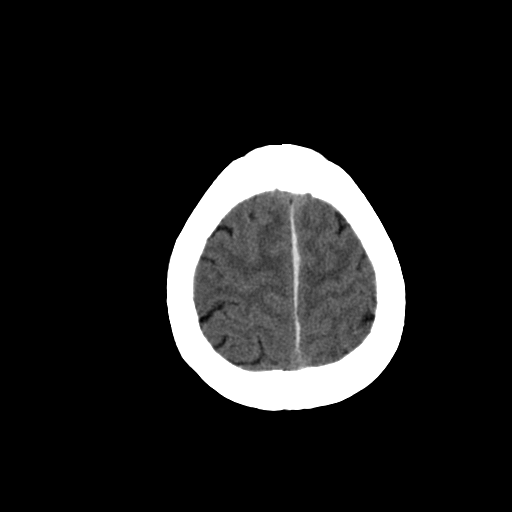
[im 26/28  brain]
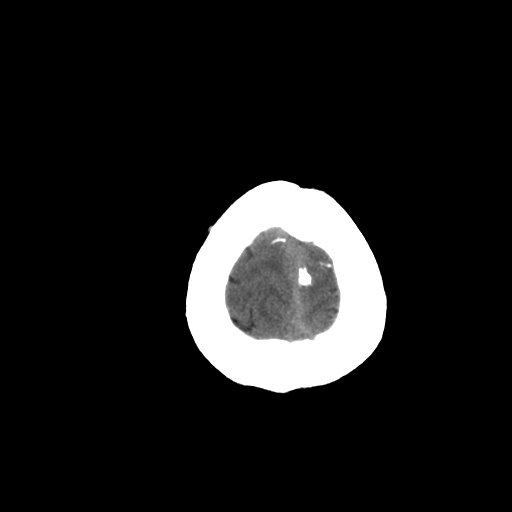
[im 26/28  bone]
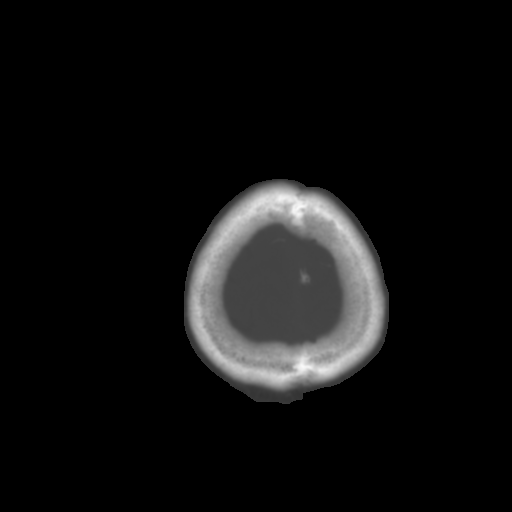

[13 of 30 positions shown; findings below may reference images not displayed]

FINDINGS: Left-sided supratentorial and parafalcine subdural
hematoma is the same.  No sign of additional bleeding.  Thickness
in the parafalcine region is only 3-4 mm.  Along the left side of
the tentorium, the thick this is probably a six or 7 mm.  No
subarachnoid or intraventricular blood is seen.  Chronic small
vessel changes within the hemispheric white matter.  The same.  No
skull fracture is seen.  No traumatic fluid in the sinuses, middle
ears or mastoids.
IMPRESSION: Stable appearance of the left sided subdural hematoma along the
superior surface of the tentorium and along the left side of the
falx.  No additional bleeding.  No significant mass effect.

## 2009-06-13 ENCOUNTER — Encounter: Admission: RE | Admit: 2009-06-13 | Discharge: 2009-06-13 | Payer: Self-pay | Admitting: Neurosurgery

## 2009-06-13 IMAGING — CT CT HEAD W/O CM
2 series · 15 of 30 positions shown, 19 images · non-contrast
Comparison: [DATE]

CLINICAL DATA: Follow-up subdural hematoma.  Confusion and memory
loss.

CT HEAD WITHOUT CONTRAST
TECHNIQUE: Contiguous axial images were obtained from the base of
the skull through the vertex without contrast.

[Series 3: head bone · axial · 0.45mm/px · z∈[+24,+44]mm · 2 of 28 slices shown]
[im 2/28  bone]
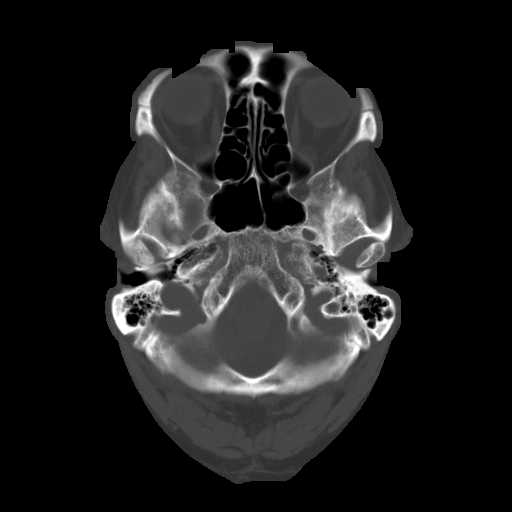
[im 6/28  bone]
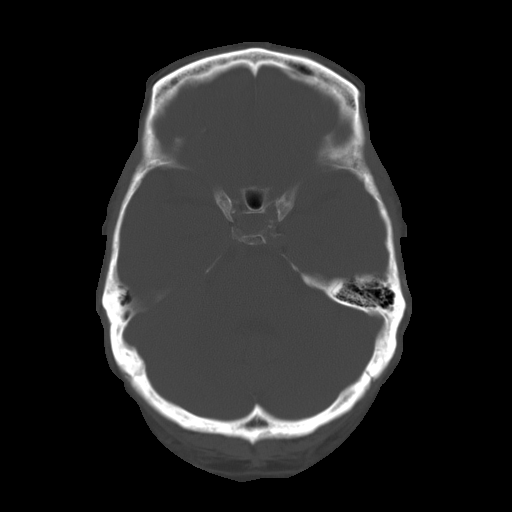

[Series 32: 3d filtered head w/o · axial · non-contrast · 0.45mm/px · z∈[+24,+145]mm · 13 of 28 slices shown, 17 images]
[im 2/28  brain]
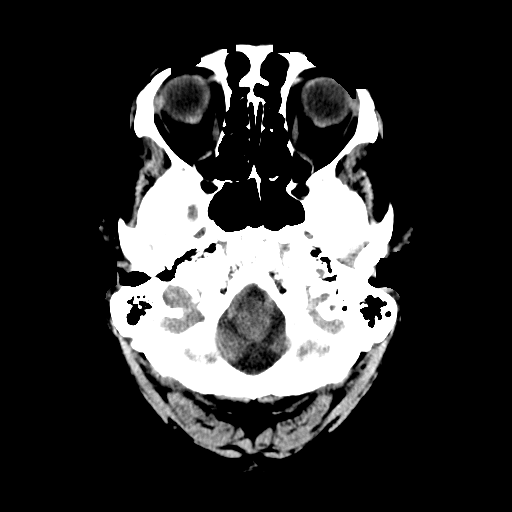
[im 2/28  bone]
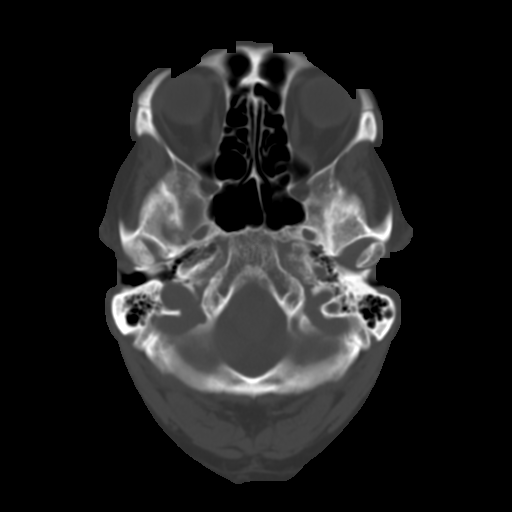
[im 4/28  brain]
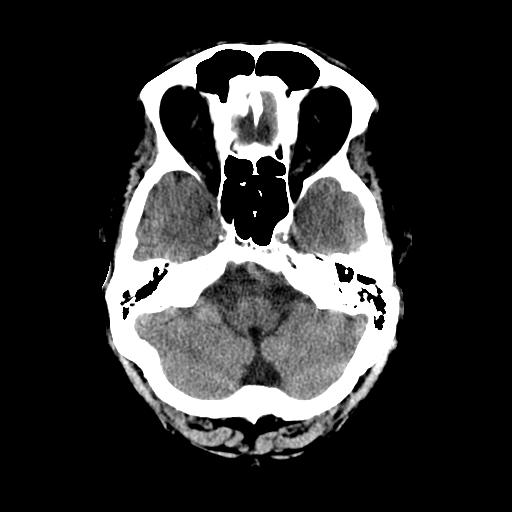
[im 6/28  brain]
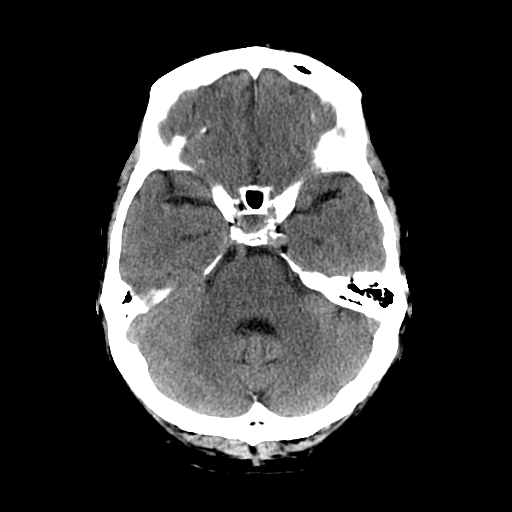
[im 8/28  brain]
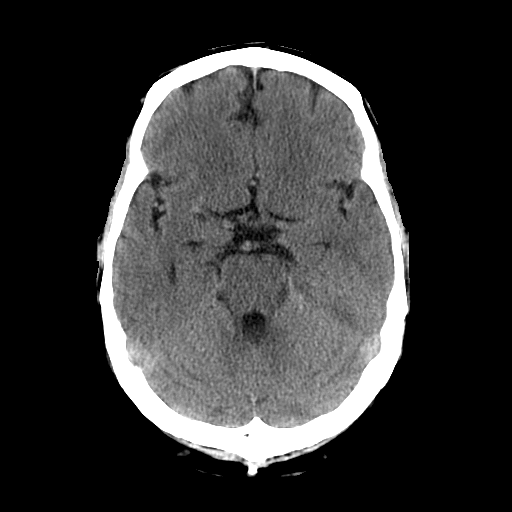
[im 10/28  brain]
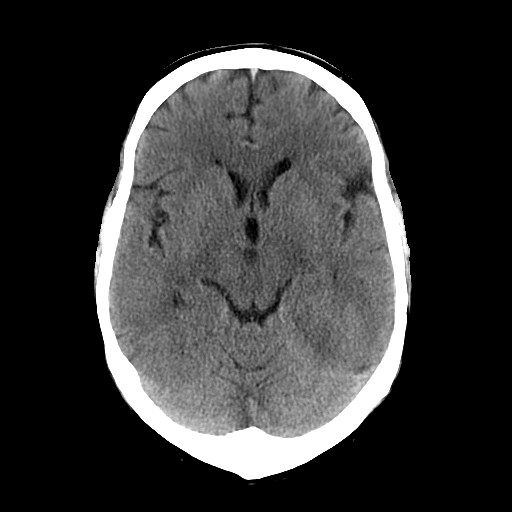
[im 10/28  bone]
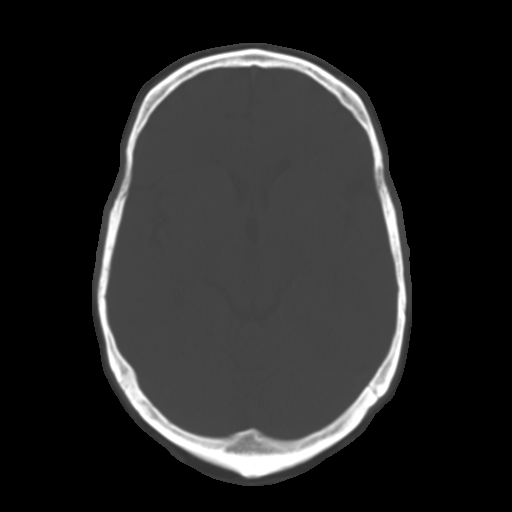
[im 12/28  brain]
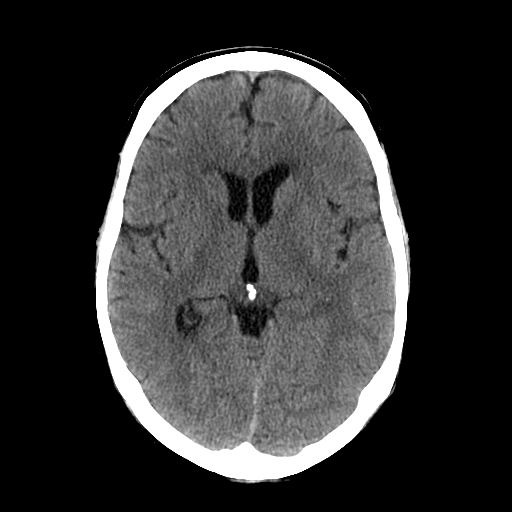
[im 14/28  brain]
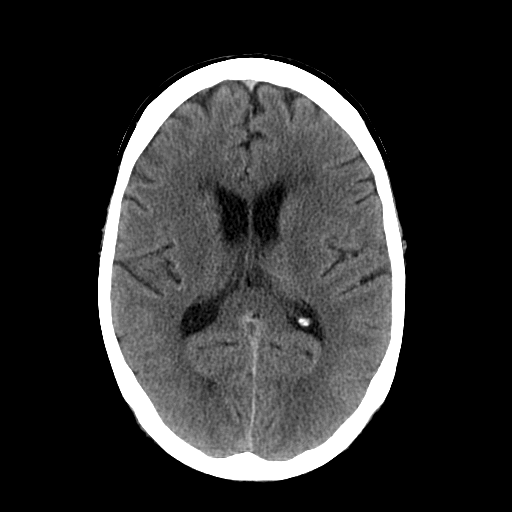
[im 16/28  brain]
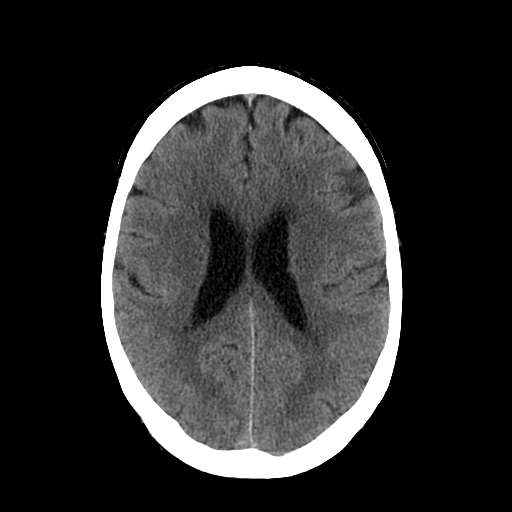
[im 18/28  brain]
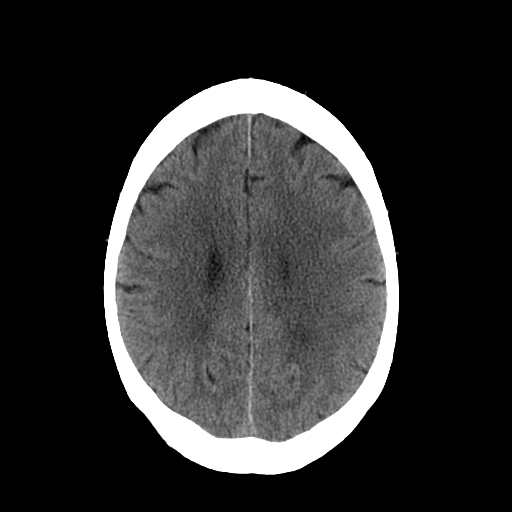
[im 18/28  bone]
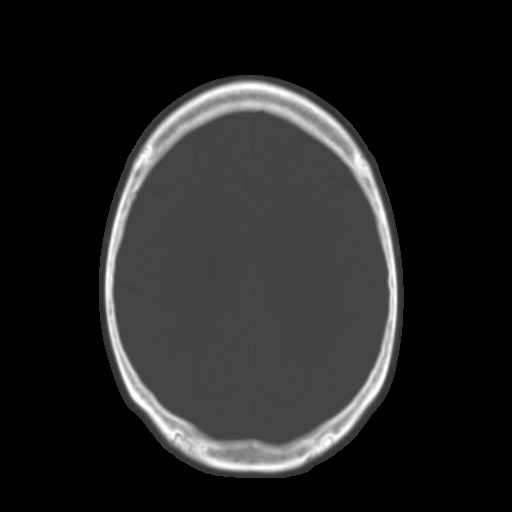
[im 20/28  brain]
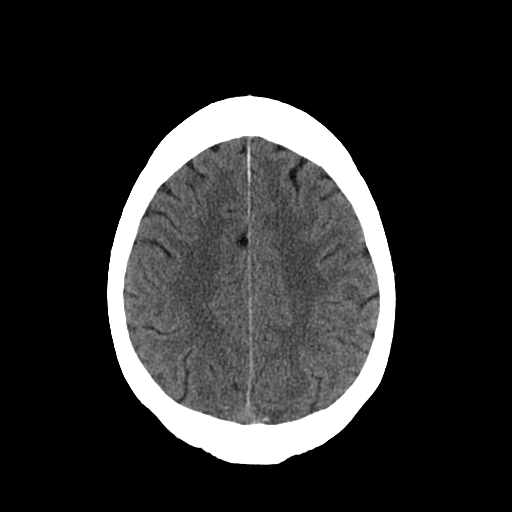
[im 22/28  brain]
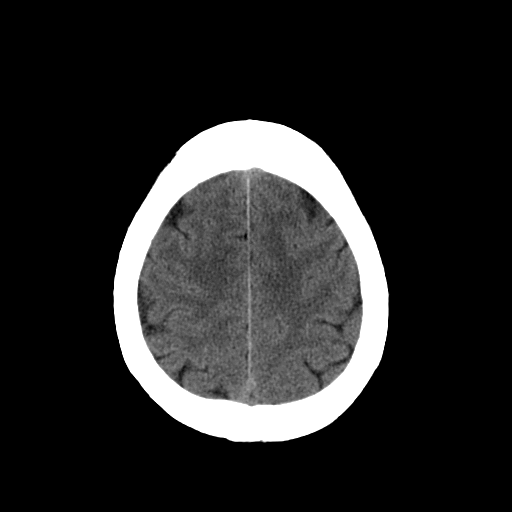
[im 24/28  brain]
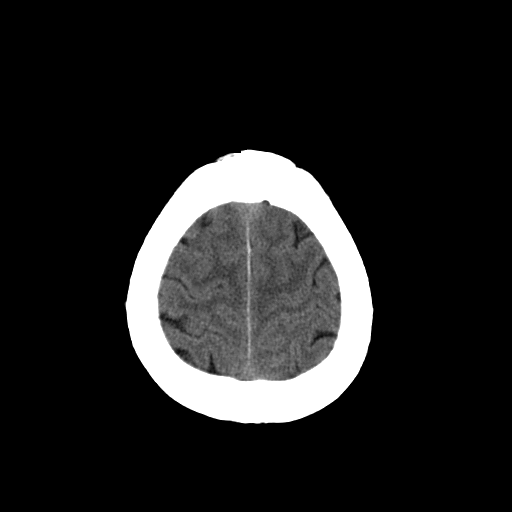
[im 26/28  brain]
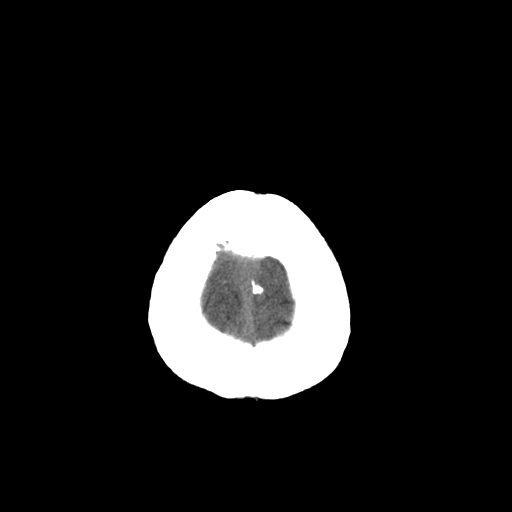
[im 26/28  bone]
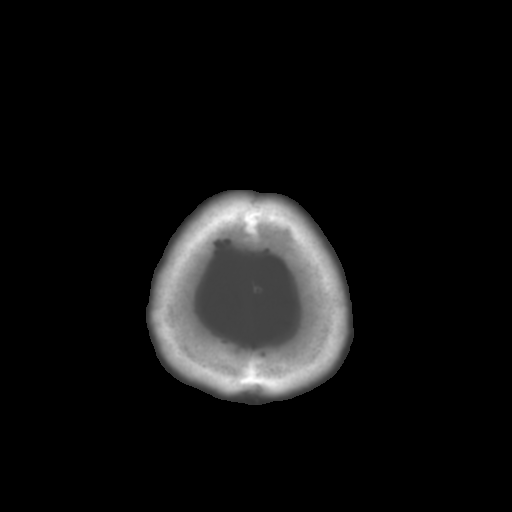

[15 of 30 positions shown; findings below may reference images not displayed]

FINDINGS: The brainstem and cerebellum are unremarkable.  Subdural
blood previously seen layering along the left side of the falx in
the left side of the tentorium has resolved.  No evidence of
residual hyperdense blood.  There may be a very small amount of low
density fluid remaining along the left side of the tentorium.  The
brain shows mild chronic small vessel disease within the deep white
matter.  There is atherosclerotic calcification of the major
vessels at the base of the brain.  The calvarium is unremarkable.
Sinuses, middle ears and mastoids are clear.
IMPRESSION: Near complete resolution of subdural blood on the left.  There may
be a small amount of residual low density fluid along the superior
surface of the tentorium on the left but this is quite minimal.
Certainly there is been no additional bleeding or increase and
subdural material.

Mild chronic small vessel change of the hemispheric white matter.

## 2010-09-26 NOTE — Discharge Summary (Signed)
Jorge Cherry, Jorge Cherry                  ACCOUNT NO.:  [id number]   MEDICAL RECORD NO.:  [id number]          PATIENT TYPE:  OIB   LOCATION:  6532                         FACILITY:  [facility]   PHYSICIAN:  Nicki Guadalajara, M.D.     DATE OF BIRTH:  May 07, 1936   DATE OF ADMISSION:  03/04/2004  DATE OF DISCHARGE:  03/05/2004                                 DISCHARGE SUMMARY   DISCHARGE DIAGNOSES:  1.  Unstable angina pectoris, status post percutaneous transluminal coronary      angioplasty and stenting of the saphenous vein graft to the diagonal      vessel with TAXUS stent.  The patient still has residual disease of the      left anterior descending distal territory beyond the graft for which he      needs medical therapy and a possible __________.  2.  Known coronary artery disease, status post coronary artery bypass graft      in 1994.  3.  Mitral regurgitation.  4.  Hypertension.  5.  Hyperlipidemia.   DISCHARGE MEDICATIONS:  1.  Aspirin 81 mg daily.  2.  Plavix 75 mg daily.  3.  Cardizem 300 mg daily.  4.  Lasix 40 mg daily.  5.  Kay Ciel 20 mEq daily.  6.  Lipitor 40 mg daily.  7.  Cardura 1 mg daily.  8.  Toprol-XL 50 mg daily.  9.  Diovan 80 mg daily.  10. Nexium 40 mg daily.  11. Imdur 60 mg daily.   DISCHARGE ACTIVITY:  No lifting greater than 5 pounds.  No strenuous  activities.  No driving for 3 days post cath.   DISCHARGE DIET:  Low-fat, low-cholesterol diet.   DISCHARGE FOLLOWUP:  Dr. Nicki Guadalajara will see the patient on March 18, 2004 at 9:30 a.m.   HOSPITAL COURSE:  This is a 75 year old Caucasian gentleman, a patient of  Dr. Tresa Endo, who was seen in our office on February 21, 2004 with complaints of  chest burning and tightness and patient underwent Cardiolite stress test  which showed new abnormalities -- lateral as well as apical ischemia and EF  of 65% -- and he had a diagnostic cath at [facility] on February 27, 2004 showing high-grade lesion of the  SVG to the diagonal vessel and  also 99% to 100% occlusion of the apical portion of the LAD.  The patient  was scheduled for elective PCI and brought to the hospital for the procedure  on March 04, 2004.   HOSPITAL PROCEDURE:  Cardiac catheterization performed by Dr. Tresa Endo.  It was  successful percutaneous transluminal coronary angioplasty and stenting with  TAXUS stent of the saphenous vein graft to the diagonal vessel.  That  attempt also was taken to intervene on a portion of the occluded left  anterior descending, but wire was never able to be advanced to that side and  it became apparent that due to the marked tortuosity of the left internal  mammary artery graft, that balloon dilatation would not be possible because  catheter shaft would not  be long enough to reach the apical left anterior  descending lesion and decision was made to proceed with medical therapy of  this distal left anterior descending lesion.  The patient tolerated the  procedure well.  He had a mild hematoma of the groin which was expressed and  pressure applied.  The next morning, patient did not have any complaints of  chest pain or shortness of breath; no complaints of groin discomfort and  problems with ambulation.  The groin still had ecchymosis, probably 10 cm in  diameter, which was slightly tender, and bilateral dorsalis pedis pulses  were intact, 1+.   The patient was assessed by Dr. Madaline Savage and deemed as stable for  discharge home.  He was discharged home on the above-mentioned medication  list and will be seen by Dr. Tresa Endo on March 18, 2004.  His labs on day of  discharge showed hemoglobin 13.4, hematocrit 38.2, platelet count 171,000,  white blood cell count 15.4; sodium 137, potassium 3.0, BUN 17, creatinine  1.3 and glucose 153, and potassium was repleted for him while at the  hospital.       MK/MEDQ  D:  03/05/2004  T:  03/05/2004  Job:  [id number]   cc:   [facility]

## 2010-09-26 NOTE — Procedures (Signed)
[facility]  Patient:    Jorge Cherry, Jorge Cherry Visit Number: [id number] MRN: [id number]          Service Type: END Location: ENDO Attending Physician:  Louie Bun Dictated by:   Everardo All Madilyn Fireman, M.D. Proc. Date: 11/04/01 Admit Date:  11/04/2001                             Procedure Report  PROCEDURE:  Colonoscopy.  INDICATION FOR PROCEDURE:  History of adenomatous colon polyps.  Previous colonoscopy four years ago.  DESCRIPTION OF PROCEDURE:  The patient was placed in the left lateral decubitus position and placed on the pulse monitor with continuous low-flow oxygen delivered by nasal cannula.  He was sedated with 60 mg of IV Demerol and 6 mg of IV Versed.  The Olympus video colonoscope was inserted into the rectum and advanced to the cecum, confirmed by transillumination at McBurneys point and visualization of the ileocecal valve and appendiceal orifice.  The prep was good.  The cecum, ascending and transverse colon appeared normal with no masses, polyps, diverticula, or other mucosal abnormalities.  Within the descending and sigmoid colon, there was extensive diverticulosis, and no other abnormalities noted.  The rectum appeared normal, and retroflexed view of the anus revealed no obvious internal hemorrhoids.  The colonoscope was then withdrawn, and the patient returned to the recovery room in stable condition. He tolerated the procedure well, and there were no immediate complications.  IMPRESSION:  Diverticulosis, otherwise normal colonoscopy.  PLAN:  Repeat colonoscopy in five years. Dictated by:   Everardo All Madilyn Fireman, M.D. Attending Physician:  Louie Bun DD:  11/04/01 TD:  11/05/01 Job: [id number]

## 2010-09-26 NOTE — Op Note (Signed)
NAMEREXTON, GREULICH                  ACCOUNT NO.:  [id number]   MEDICAL RECORD NO.:  [id number]          PATIENT TYPE:  AMB   LOCATION:  DSC                          FACILITY:  [facility]   PHYSICIAN:  Adolph Pollack, M.D.DATE OF BIRTH:  08-17-1935   DATE OF PROCEDURE:  04/19/2006  DATE OF DISCHARGE:                               OPERATIVE REPORT   PREOPERATIVE DIAGNOSIS:  Right inguinal hernia.   POSTOPERATIVE DIAGNOSIS:  Right inguinal hernia.   PROCEDURE:  Right inguinal hernia with mesh.   SURGEON:  Adolph Pollack, M.D.   ANESTHESIA:  General.   INDICATION:  Mr. Bee is a 75 year old male who developed some right  groin discomfort and was found to have a right inguinal hernia.  It is  reducible.  He now presents for repair.  We have discussed the  procedure, risks and aftercare preoperatively.   TECHNIQUE:  He was seen in the holding area and the right groin marked  with my initials.  He was then brought to the operating room, placed  supine on the operating table and given a general anesthetic by way of  an LMA.  The hair in the right groin area was clipped and the area  sterilely prepped and draped.  Local anesthetic consisting of a mixture  of lidocaine and Marcaine was infiltrated superficially and deep in the  right groin.  A right groin incision was then made dividing the skin,  subcutaneous tissue and Scarpa fascia down to level of the external  oblique aponeurosis.  Local anesthetic was infiltrated deep to the  external oblique aponeurosis.  An incision was made in the external  oblique aponeurosis through the external ring medially and up toward the  anterior superior iliac spine laterally.  Using blunt dissection I  identified the internal oblique muscle and aponeurosis superiorly and  the shelving edge of the inguinal ligament inferiorly.  The ilioinguinal  nerve was adherent to the spermatic cord and I left it there.   I noted a direct extraperitoneal fat  stuck to the cord from a direct  hernia.  I dissected this free from the cord and reduced it back into  the extraperitoneal space and I loosely approximated the redundant and  weakened transversalis fascia with some interrupted 2-0 Vicryl sutures.  There was a lot of lipomatous tissue around the cord, some of which I  dissected free from the cord.  I did not see any indirect hernia.   Following this a piece of 3 x 6 inch polypropylene mesh was brought into  the field and it was anchored 1 cm medial to the pubic tubercle with 2-0  Prolene suture.  The inferior aspect of the mesh was then anchored to  the shelving edge of the inguinal ligament with a running 2-0 Prolene  suture about 1-2 cm lateral to the internal ring and then tied.  A slit  was cut into the mesh and the tails wrapped around the spermatic cord.  The superior aspect of the mesh was anchored to the internal oblique  aponeurosis with interrupted 2-0 Vicryl sutures.  The two tails of the  mesh were crossed creating a new internal ring, and these were anchored  to the shelving edge of the inguinal ligament with a 2-0 Prolene suture.  I tightened the aperture a little bit with another 2-0 Prolene suture  but this still allowed for the introduction of the tip of the hemostat  and the cord was mobile.   The lateral aspect of the mesh was then tucked deep to the external  oblique aponeurosis.  More local anesthetic was infiltrated into the  internal oblique aponeurosis.  Hemostasis was adequate.  The external  oblique aponeurosis was closed over the mesh and cord with a running 3-0  Vicryl suture.  Scarpa fascia was closed with a running 2-0 Vicryl  suture.  The skin was closed with a 4-0 Monocryl subcuticular stitch  followed by Steri-Strips and sterile dressing.   He tolerated the procedure without any apparent complications and was  taken to recovery in satisfactory condition.  He will be given discharge  instructions and  Tylox for pain and follow up in the office in 2-3  weeks.      Adolph Pollack, M.D.  Electronically Signed     TJR/MEDQ  D:  04/20/2006  T:  04/20/2006  Job:  [id number]   cc:   Larina Earthly, M.D.  Nicki Guadalajara, M.D.

## 2010-09-26 NOTE — Cardiovascular Report (Signed)
Jorge Cherry                  ACCOUNT NO.:  [id number]   MEDICAL RECORD NO.:  [id number]          PATIENT TYPE:  OIB   LOCATION:  2899                         FACILITY:  [facility]   PHYSICIAN:  Nicki Guadalajara, M.D.     DATE OF BIRTH:  1935/09/11   DATE OF PROCEDURE:  03/04/2004  DATE OF DISCHARGE:                              CARDIAC CATHETERIZATION   INDICATIONS:  [name] is a 75 year old gentleman who is status post  remote PTCA of his circumflex and right coronary arteries.  In May 1994 he  underwent bypass surgery due to progressive disease with a LIMA to his LAD,  vein to his diagonal, vein to his marginal, and vein to his right coronary  artery.  Additional problems have included hypertension and hyperlipidemia.  He had recently developed increasing symptoms of chest pain and a Cardiolite  study suggested new abnormalities with lateral as well as apical ischemia  and an ejection fraction 55%.  He underwent cardiac catheterization at  [facility] on February 27, 2004 which showed preserved global  contractility with distal anterolateral apical hypokinesis.  He had severe  native coronary artery disease and was found to have a high grade 95% very  proximal stenosis in saphenous vein graft supplying the diagonal vessel, a  patent vein graft supplying the right coronary artery, a patent vein graft  supplying an obtuse marginal vessel, and a patent LIMA to the LAD with mild  kinking in the body of the graft, 60-70% smooth narrowing beyond the  anastomosis with 99-100% occlusion just proximal to the apical portion of  the LAD.  The patient was started on Plavix therapy.  He is now brought in  for intervention to the vein graft to the diagonal vessel and possible  relook at the distal LAD anastomotic lesion.   PROCEDURE:  After premedication with Valium 5 mg intravenously the patient  was prepped and draped in the usual fashion.  His right femoral artery was  punctured anteriorly and a 6-French sheath was inserted.  Double bolus  Integrilin and weight-adjusted heparinization was administered.  The patient  also has been on Plavix for one week prior to the procedure.  A 6-French  left bypass guide was used for the intervention.  Initially, a filter wire  was attempted to be advanced down the vein graft but due to poor catheter  backup, the filter wire was not able to be advanced far enough to allow for  optimal opening of the filter.  For this reason, this was removed and a Luge  wire was then advanced and fairly easily advanced throughout the graft to  the diagonal vessel.  Low level pre dilatation was done due to the subtotal  stenosis with a 2.0 x 15 mm Maverick balloon.  A 3.5 x 16 mm Taxus drug-  eluting stent was then successfully deployed in this proximal graft and  dilated to 16 atmospheres.  Post stent dilatation was done with a 4.0 x 15  Quantum balloon post dilated to 4.0 mm.  Scout angiography confirmed an  excellent angiographic result.  There was TIMI 3 flow.  There was no  evidence for dissection.  The patient did not experience any chest pain or  did not have any significant ST changes as a consequence of the procedure.   An attempt was then made to relook at the LIMA graft.  This, again, confirms  a mild kinking in the mid vessel which did not appear to be too significant.  However, the LIMA graft was very tortuous.  It anastomosed into the mid LAD  where the lesion appeared 50-60% and somewhat smooth.  Beyond this there was  a 95% stenosis followed by subtotal stenosis.  An ACT was documented to be  therapeutic.  It was an initial thought to perhaps consider attempting  opening up the 90% stenosis distally and then see if, perhaps, the 99%  stenosis could be open in light of the apical ischemia seen on the  Cardiolite study.  However, the Luge wire was never able to be advanced to  this site and it became apparent that due to  the marked tortuosity of the  LIMA graft that balloon dilatation would not be possible since the catheter  shaft would not be long enough to reach the apical LAD lesion.  Therefore,  there was not any attempt to dilate this segment.  The patient remained  stable.  Plans are for medical therapy of his distal LAD lesion.  He  tolerated the procedure well.   HEMODYNAMIC DATA:  Central aortic pressure was 150/87, mean 114.  During the  procedure the patient received several doses of 200 mcg intracoronary  nitroglycerin.   ANGIOGRAPHIC DATA:  The vein graft supplying the diagonal vessel was  moderate caliber graft.  There was at least 95-99% very proximal stenosis in  this graft.  There were some areas of ectasia throughout portions of the  graft and the graft supplied a diagonal vessel which ultimately bifurcated  into two branches.  Following successful PTCA and stenting, the 99% stenosis  was reduced to 0%.  There was TIMI 3 flow.  There was no evidence for  dissection.   A relook at the LIMA graft showed, again, this to be markedly tortuous with  an evidence for a 20-30% kink in the mid portion of the graft where there  was a sharp angle in this graft.  Beyond the anastomosis the LAD had smooth  50-60% narrowing.  Further down there was a 90% stenosis and then a 99%  diffuse apical stenosis with collateralization beyond this.   IMPRESSION:  1.  Successful PTCA/stenting of the saphenous vein graft supplying the      diagonal vessel utilizing a 3.5 x 16 mm Taxus stent post dilated to 4.0      mm done with double bolus Integrilin/weight-adjusted heparinization.  2.  Medical therapy for the distal left anterior descending disease beyond      left internal mammary artery graft.  The patient will also be considered      for possible ECP therapy in light of his additional concomitant coronary      artery disease.      TK/MEDQ  D:  03/04/2004  T:  03/04/2004  Job:  [id number]   cc:   Jorge Cherry, M.D.  [address]  [place]  [place] [address]  Fax: [phone]

## 2012-09-28 ENCOUNTER — Telehealth: Payer: Self-pay | Admitting: Cardiovascular Disease

## 2012-09-28 NOTE — Telephone Encounter (Signed)
Left Message for patient to call me  To schedule the appointment

## 2012-10-12 ENCOUNTER — Other Ambulatory Visit: Payer: Self-pay | Admitting: Cardiovascular Disease

## 2012-10-12 LAB — LIPID PANEL
Cholesterol: 172 mg/dL (ref 0–200)
HDL: 36 mg/dL — ABNORMAL LOW (ref >39)
LDL Cholesterol: 90 mg/dL (ref 0–99)
Total CHOL/HDL Ratio: 4.8 Ratio
Triglycerides: 231 mg/dL — ABNORMAL HIGH (ref <150)
VLDL: 46 mg/dL — ABNORMAL HIGH (ref 0–40)

## 2012-10-12 LAB — COMPREHENSIVE METABOLIC PANEL
ALT: 18 U/L (ref 0–53)
AST: 22 U/L (ref 0–37)
Albumin: 4.1 g/dL (ref 3.5–5.2)
Alkaline Phosphatase: 48 U/L (ref 39–117)
BUN: 21 mg/dL (ref 6–23)
CO2: 23 mEq/L (ref 19–32)
Calcium: 8.8 mg/dL (ref 8.4–10.5)
Chloride: 109 mEq/L (ref 96–112)
Creat: 1.71 mg/dL — ABNORMAL HIGH (ref 0.50–1.35)
Glucose, Bld: 86 mg/dL (ref 70–99)
Potassium: 4.5 mEq/L (ref 3.5–5.3)
Sodium: 142 mEq/L (ref 135–145)
Total Bilirubin: 0.4 mg/dL (ref 0.3–1.2)
Total Protein: 6.2 g/dL (ref 6.0–8.3)

## 2012-10-24 ENCOUNTER — Encounter: Payer: Self-pay | Admitting: Cardiovascular Disease

## 2012-10-25 ENCOUNTER — Encounter: Payer: Self-pay | Admitting: Cardiovascular Disease

## 2012-10-25 ENCOUNTER — Ambulatory Visit (INDEPENDENT_AMBULATORY_CARE_PROVIDER_SITE_OTHER): Payer: Medicare Other | Admitting: Cardiovascular Disease

## 2012-10-25 VITALS — BP 130/84 | HR 62 | Ht 70.0 in | Wt 203.7 lb

## 2012-10-25 DIAGNOSIS — E785 Hyperlipidemia, unspecified: Secondary | ICD-10-CM

## 2012-10-25 DIAGNOSIS — H9190 Unspecified hearing loss, unspecified ear: Secondary | ICD-10-CM

## 2012-10-25 DIAGNOSIS — I359 Nonrheumatic aortic valve disorder, unspecified: Secondary | ICD-10-CM

## 2012-10-25 DIAGNOSIS — I2510 Atherosclerotic heart disease of native coronary artery without angina pectoris: Secondary | ICD-10-CM

## 2012-10-25 DIAGNOSIS — H9193 Unspecified hearing loss, bilateral: Secondary | ICD-10-CM

## 2012-10-25 DIAGNOSIS — I350 Nonrheumatic aortic (valve) stenosis: Secondary | ICD-10-CM

## 2012-10-25 DIAGNOSIS — I119 Hypertensive heart disease without heart failure: Secondary | ICD-10-CM

## 2012-10-25 DIAGNOSIS — Z951 Presence of aortocoronary bypass graft: Secondary | ICD-10-CM

## 2012-10-25 DIAGNOSIS — R609 Edema, unspecified: Secondary | ICD-10-CM

## 2012-10-25 MED ORDER — OMEGA-3 FATTY ACIDS 1000 MG PO CAPS: 2.0000 g | ORAL_CAPSULE | Freq: Two times a day (BID) | ORAL | Status: DC

## 2012-10-25 NOTE — Patient Instructions (Signed)
Your physician recommends that you schedule a follow-up appointment and labwork in 3 months.  Your physician has recommended you make the following change in your medication: Increase your fish oil to 2 tablets twice daily. The new prescription has already been sent to [facility] pharmacy.

## 2012-10-27 ENCOUNTER — Encounter: Payer: Self-pay | Admitting: Cardiovascular Disease

## 2012-10-27 DIAGNOSIS — I119 Hypertensive heart disease without heart failure: Secondary | ICD-10-CM | POA: Insufficient documentation

## 2012-10-27 DIAGNOSIS — H9190 Unspecified hearing loss, unspecified ear: Secondary | ICD-10-CM | POA: Insufficient documentation

## 2012-10-27 DIAGNOSIS — Z951 Presence of aortocoronary bypass graft: Secondary | ICD-10-CM | POA: Insufficient documentation

## 2012-10-27 DIAGNOSIS — I2510 Atherosclerotic heart disease of native coronary artery without angina pectoris: Secondary | ICD-10-CM | POA: Insufficient documentation

## 2012-10-27 DIAGNOSIS — R609 Edema, unspecified: Secondary | ICD-10-CM | POA: Insufficient documentation

## 2012-10-27 DIAGNOSIS — E785 Hyperlipidemia, unspecified: Secondary | ICD-10-CM | POA: Insufficient documentation

## 2012-10-27 NOTE — Progress Notes (Signed)
Patient ID: Jorge Cherry, male   DOB: 11/29/1935, 77 y.o.   MRN: [id number]     HPI: Cutberto DAMACIO WEISGERBER, is a 77 y.o. male  who presents to the office today for three-month cardiology evaluation.  Mr. Moncus has established coronary artery disease dating back to 1994 at which time he underwent CABG revascularization surgery. In October 2003 he underwent stenting to the proximal portion of the vein graft supplying the diagonal vessel with a 3.5x16 mm Taxus DES stent post dilated to 4.0 mm. He has diffuse disease in the distal apical portion of the LAD beyond the LIMA insertion for which she's been treated medically. Also has documented mild aortic valve stenosis with grade 2 diastolic dysfunction with concentric left ventricular hypertrophy. He has documented renal cysts, history of hypertension, mixed hyperlipidemia. His last Myoview study was in June 2003 which showed a minimal apical defect. Post-stess ejection fraction was 56%.  Additional problems also include bilateral hearing loss. When I had seen him in March 2014 he was complaining that at times he felt like he was "zoning out."  At that time, I reduced his diltiazem from 300 mg to 40 mg. He's felt that this has significantly improved his symptoms and he denies any further sensation. Her last echo Doppler study, his peak instantaneous gradient across his aortic valve is 25 mm with a mean gradient of only 12 mm an aortic valve area 1.8 cm. He presents today for followup evaluation.  Past Medical History  Diagnosis Date  . Aortic valve disorder     2D ECHO, 10/19/2011 - EF >55%, LA mild-moderately dilated, mild-moderate tricuspid regurgitation, mild-moderate valvular aortic stenosis, moderate calcification of aortic valve leaflets  . DOE (dyspnea on exertion)     LEXISCAN, 11/05/2011 - Non-diagnositc for ischemia, post-stress EF 56%, small fixed apical inferior, apical lateral defect which may represent scar  . Atherosclerosis of renal artery     RENAL  DOPPLER, 12/10/2011 - Left renal artery demonstrated narrowing with elevated velocities consistent with a 1-59% diameter reduction    Past Surgical History  Procedure Laterality Date  . Cardiac catheterization  02/27/2004    Coronary intervention and medical management  . Cardiac catheterization  03/04/2004    SVG supplying the diagonal vessel stented with a 3.5x66mm Taxus stent post dilated to 4.0 mm    Allergies  Allergen Reactions  . Contrast Media (Iodinated Diagnostic Agents)     Current Outpatient Prescriptions  Medication Sig Dispense Refill  . aspirin 81 MG tablet Take 81 mg by mouth daily.      [name] atorvastatin (LIPITOR) 40 MG tablet Take 40 mg by mouth daily.      . Cholecalciferol (VITAMIN D-3 PO) Take 750 Units by mouth daily.      [name] diltiazem (CARDIZEM CD) 300 MG 24 hr capsule Take 240 mg by mouth daily.       . fenofibrate (TRICOR) 145 MG tablet Take 145 mg by mouth daily.      . fluticasone (FLONASE) 50 MCG/ACT nasal spray Place 1 spray into the nose 2 (two) times daily.      . metoprolol succinate (TOPROL-XL) 100 MG 24 hr tablet Take 100 mg by mouth daily. Take with or immediately following a meal.      . pantoprazole (PROTONIX) 40 MG tablet Take 40 mg by mouth 2 (two) times daily.      . prasugrel (EFFIENT) 10 MG TABS Take 10 mg by mouth daily.      [name] PROAIR  HFA 108 (90 BASE) MCG/ACT inhaler Inhale 1 puff into the lungs as needed.      [name] spironolactone (ALDACTONE) 25 MG tablet Take 12.5 mg by mouth daily.      [name] telmisartan (MICARDIS) 80 MG tablet Take 80 mg by mouth daily.      [name] torsemide (DEMADEX) 20 MG tablet Take 20 mg by mouth daily.      . vitamin C (ASCORBIC ACID) 500 MG tablet Take 500 mg by mouth daily.      . vitamin E 400 UNIT capsule Take 400 Units by mouth daily.      . fish oil-omega-3 fatty acids 1000 MG capsule Take 2 capsules (2 g total) by mouth 2 (two) times daily.  120 capsule  11   No current facility-administered medications for this visit.     Socially he is married and has 2 children and 4 grandchildren. There is no tobacco alcohol use. Recently he was has been very active.  ROS is negative for fever chills night sweats. He denies wheezing. He denies any further lightheadedness. He denies shortness of breath. He denies abdominal pain. He denies bleeding he does note some trace edema in his right lower extremity. He denies rash. He denies tremors. He is unaware of any palpitations. Other system review is negative.  PE BP 130/84  Pulse 62  Ht 5\' 10"  (1.778 m)  Wt 203 lb 11.2 oz (92.398 kg)  BMI 29.23 kg/m2  General: Alert, oriented, no distress.  Skin: normal turgor, no rashes HEENT: Normocephalic, atraumatic. Pupils round and reactive; sclera anicteric;no lid lag,  Nose without nasal septal hypertrophy Mouth/Parynx benign; Mallinpatti scale 2/3 Neck: No JVD, no carotid briuts Lungs: clear to ausculatation and percussion; no wheezing or rales Heart: RRR, s1 s2 normal 2/6 sem Chest wall has a mild pectus excavatum deformity the Abdomen: soft, nontender; no hepatosplenomehaly, BS+; abdominal aorta nontender and not dilated by palpation. He does have mild diastases recti Pulses 2+ Extremities: Trace edema in the right lower extremity; no clubbing cyanosis, Homan's sign negative  Neurologic: grossly nonfocal  ECG: Normal sinus rhythm. Mild RV conduction delay. LVH  LABS:  BMET    Component Value Date/Time   NA 142 10/12/2012 0950   K 4.5 10/12/2012 0950   CL 109 10/12/2012 0950   CO2 23 10/12/2012 0950   GLUCOSE 86 10/12/2012 0950   BUN 21 10/12/2012 0950   CREATININE 1.71* 10/12/2012 0950   CALCIUM 8.8 10/12/2012 0950     Hepatic Function Panel     Component Value Date/Time   PROT 6.2 10/12/2012 0950   ALBUMIN 4.1 10/12/2012 0950   AST 22 10/12/2012 0950   ALT 18 10/12/2012 0950   ALKPHOS 48 10/12/2012 0950   BILITOT 0.4 10/12/2012 0950     CBC No results found for this basename: wbc, rbc, hgb, hct, plt, mcv, mch, mchc, rdw,  neutrabs, lymphsabs, monoabs, eosabs, basosabs     BNP No results found for this basename: probnp    Lipid Panel     Component Value Date/Time   CHOL 172 10/12/2012 0950   TRIG 231* 10/12/2012 0950   HDL 36* 10/12/2012 0950   CHOLHDL 4.8 10/12/2012 0950   VLDL 46* 10/12/2012 0950   LDLCALC 90 10/12/2012 0950     RADIOLOGY: No results found.    ASSESSMENT AND PLAN: The cardiac standpoint, Mr. Rockwell feels improved with reference to his pre-zone out" sensation. His blood pressure is still relatively controlled on reduced dose of diltiazem.  He's not having ectopy. He only has very mild aortic stenosis and I do not feel this has been contributory to his previous symptoms. I did review his recent laboratory. I'm recommending he increase fish oil intake to 2 capsules twice a day. His triglycerides remain elevated and HDL low and he does have an increase VLDL. Triglycerides continued to stay elevated a trial of Niaspan or fenofibrate may be helpful. He is not having any anginal symptoms with reference to his coronary disease. His last nuclear perfusion study was one year ago. I will see him in 3 months for reevaluation.     Lennette Bihari, MD, [facility]  10/27/2012 2:34 PM

## 2012-11-08 ENCOUNTER — Other Ambulatory Visit: Payer: Self-pay | Admitting: *Deleted

## 2012-11-16 ENCOUNTER — Telehealth: Payer: Self-pay | Admitting: *Deleted

## 2012-11-16 NOTE — Telephone Encounter (Signed)
Left message to return a call to me so that i can find out what prescription that he needs to be refilled. The name was not left on the yellow walk-in sheet when it was filled out on 11/08/12.

## 2012-11-17 ENCOUNTER — Telehealth: Payer: Self-pay | Admitting: Cardiovascular Disease

## 2012-11-17 NOTE — Telephone Encounter (Signed)
Pt returning call to [facility]

## 2012-11-18 ENCOUNTER — Telehealth: Payer: Self-pay | Admitting: *Deleted

## 2012-11-18 NOTE — Telephone Encounter (Signed)
Left message call returned.  Please call back with prescription information.

## 2012-11-24 ENCOUNTER — Telehealth: Payer: Self-pay | Admitting: *Deleted

## 2012-11-24 ENCOUNTER — Other Ambulatory Visit: Payer: Self-pay | Admitting: *Deleted

## 2012-11-24 DIAGNOSIS — E785 Hyperlipidemia, unspecified: Secondary | ICD-10-CM

## 2012-11-24 DIAGNOSIS — Z79899 Other long term (current) drug therapy: Secondary | ICD-10-CM

## 2012-11-24 NOTE — Telephone Encounter (Signed)
Called patient for a 3rd time.Jorge KitchenMarland KitchenI spoke with the patient about his prescription request for the fenofibrate. He informed me that the prescription that was e-scribed by me to his pharmacy in April he never got. The pharmacy says that they never received it. Patient has been in and out of town with his wife in [place]. Decided to try to change his eating habits to see whether this will make a difference in his triglyceride level. He requested to continue with his diet for 1 more month and then have blood redrawn and go from there. I will mail lab order for patient to have his bloodwork done. In one month.

## 2012-12-06 ENCOUNTER — Other Ambulatory Visit: Payer: Self-pay | Admitting: *Deleted

## 2012-12-06 MED ORDER — METOPROLOL SUCCINATE ER 100 MG PO TB24: 100.0000 mg | ORAL_TABLET | Freq: Every day | ORAL | Status: DC

## 2012-12-06 NOTE — Telephone Encounter (Signed)
Rx was sent to pharmacy electronically. 

## 2012-12-12 ENCOUNTER — Telehealth: Payer: Self-pay | Admitting: Cardiovascular Disease

## 2012-12-12 NOTE — Telephone Encounter (Signed)
infomred patient that I did not call hi on Friday. Patient states it must have been an old call that had not been erased from his answering machine.

## 2012-12-12 NOTE — Telephone Encounter (Signed)
Returning your call from Friday. °

## 2012-12-15 ENCOUNTER — Other Ambulatory Visit: Payer: Self-pay

## 2012-12-15 MED ORDER — ATORVASTATIN CALCIUM 40 MG PO TABS: 40.0000 mg | ORAL_TABLET | Freq: Every day | ORAL | Status: DC

## 2012-12-15 NOTE — Telephone Encounter (Signed)
Rx was sent to pharmacy electronically. 

## 2012-12-16 ENCOUNTER — Other Ambulatory Visit: Payer: Self-pay | Admitting: *Deleted

## 2012-12-16 MED ORDER — SPIRONOLACTONE 25 MG PO TABS: 12.5000 mg | ORAL_TABLET | Freq: Every day | ORAL | Status: DC

## 2012-12-16 NOTE — Telephone Encounter (Signed)
Rx was sent to pharmacy electronically. 

## 2012-12-30 LAB — COMPREHENSIVE METABOLIC PANEL
ALT: 27 U/L (ref 0–53)
AST: 26 U/L (ref 0–37)
Albumin: 3.9 g/dL (ref 3.5–5.2)
Alkaline Phosphatase: 41 U/L (ref 39–117)
BUN: 26 mg/dL — ABNORMAL HIGH (ref 6–23)
CO2: 23 mEq/L (ref 19–32)
Calcium: 9.2 mg/dL (ref 8.4–10.5)
Chloride: 110 mEq/L (ref 96–112)
Creat: 1.67 mg/dL — ABNORMAL HIGH (ref 0.50–1.35)
Glucose, Bld: 87 mg/dL (ref 70–99)
Potassium: 4.2 mEq/L (ref 3.5–5.3)
Sodium: 141 mEq/L (ref 135–145)
Total Bilirubin: 0.5 mg/dL (ref 0.3–1.2)
Total Protein: 6.3 g/dL (ref 6.0–8.3)

## 2012-12-30 LAB — LIPID PANEL
Cholesterol: 150 mg/dL (ref 0–200)
Total CHOL/HDL Ratio: 4.4 Ratio
Triglycerides: 151 mg/dL — ABNORMAL HIGH (ref <150)

## 2013-01-05 ENCOUNTER — Ambulatory Visit (INDEPENDENT_AMBULATORY_CARE_PROVIDER_SITE_OTHER): Payer: Medicare Other | Admitting: Cardiovascular Disease

## 2013-01-05 ENCOUNTER — Encounter: Payer: Self-pay | Admitting: Cardiovascular Disease

## 2013-01-05 VITALS — BP 160/90 | Ht 70.0 in | Wt 196.0 lb

## 2013-01-05 DIAGNOSIS — R609 Edema, unspecified: Secondary | ICD-10-CM

## 2013-01-05 DIAGNOSIS — I2510 Atherosclerotic heart disease of native coronary artery without angina pectoris: Secondary | ICD-10-CM

## 2013-01-05 DIAGNOSIS — R5381 Other malaise: Secondary | ICD-10-CM

## 2013-01-05 DIAGNOSIS — I359 Nonrheumatic aortic valve disorder, unspecified: Secondary | ICD-10-CM

## 2013-01-05 DIAGNOSIS — Z951 Presence of aortocoronary bypass graft: Secondary | ICD-10-CM

## 2013-01-05 DIAGNOSIS — I350 Nonrheumatic aortic (valve) stenosis: Secondary | ICD-10-CM

## 2013-01-05 DIAGNOSIS — E785 Hyperlipidemia, unspecified: Secondary | ICD-10-CM

## 2013-01-05 NOTE — Patient Instructions (Signed)
Your physician has requested that you have an echocardiogram. Echocardiography is a painless test that uses sound waves to create images of your heart. It provides your doctor with information about the size and shape of your heart and how well your heart's chambers and valves are working. This procedure takes approximately one hour. There are no restrictions for this procedure.this will be done in February 2015.  Your physician recommends that you return for lab work in: February 2015.  Your physician recommends that you schedule a follow-up appointment in: February 2015.

## 2013-01-07 ENCOUNTER — Encounter: Payer: Self-pay | Admitting: Cardiovascular Disease

## 2013-01-07 NOTE — Progress Notes (Signed)
Patient ID: Jorge Cherry, male   DOB: 02/12/1936, 77 y.o.   MRN: [id number]    HPI: Jorge Cherry, is a 77 y.o. male  who presents to the office today for a followup cardiology evaluation. I last saw him in June 2014 per  Jorge Cherry has established coronary artery disease dating back to 1994 at which time he underwent CABG revascularization surgery. In October 2003 he underwent stenting to the proximal portion of the vein graft supplying the diagonal vessel with a 3.5x16 mm Taxus DES stent post dilated to 4.0 mm. He has diffuse disease in the distal apical portion of the LAD beyond the LIMA insertion for which she's been treated medically. Also has documented mild aortic valve stenosis with grade 2 diastolic dysfunction with concentric left ventricular hypertrophy. He has documented renal cysts, history of hypertension, mixed hyperlipidemia. His last Myoview study was in June 2003 which showed a minimal apical defect. Post-stess ejection fraction was 56%.  When I saw him in March 2014 he was complaining that at times he felt like he was "zoning out."  At that time, I reduced his diltiazem from 300 mg to 240 mg. He felt that this has significantly improved his symptoms with this change and he denies any further sensation. Her last echo Doppler study, his peak instantaneous gradient across his aortic valve is 25 mm with a mean gradient of only 12 mm an aortic valve area 1.8 cm. He presents today for followup evaluation.  When I last saw him in June, his triglycerides were 231 and I further titrated his fish oil to 2 capsules twice a day. He did have repeat blood work done last week which showed a BUN of 26 cranial 1.67 which actually is improving granular 1.71 in June. His lipid panel was improved with a total cholesterol from 172-150. Triglycerides improved from 231-151. HDL remains low at 34. LDL is 86.  He does have hearing loss. He denies chest pain. He denies PND or orthopnea. He does note some  occasional leg swelling.  Past Medical History  Diagnosis Date  . Aortic valve disorder     2D ECHO, 10/19/2011 - EF >55%, LA mild-moderately dilated, mild-moderate tricuspid regurgitation, mild-moderate valvular aortic stenosis, moderate calcification of aortic valve leaflets  . DOE (dyspnea on exertion)     LEXISCAN, 11/05/2011 - Non-diagnositc for ischemia, post-stress EF 56%, small fixed apical inferior, apical lateral defect which may represent scar  . Atherosclerosis of renal artery     RENAL DOPPLER, 12/10/2011 - Left renal artery demonstrated narrowing with elevated velocities consistent with a 1-59% diameter reduction    Past Surgical History  Procedure Laterality Date  . Cardiac catheterization  02/27/2004    Coronary intervention and medical management  . Cardiac catheterization  03/04/2004    SVG supplying the diagonal vessel stented with a 3.5x51mm Taxus stent post dilated to 4.0 mm    Allergies  Allergen Reactions  . Contrast Media [Iodinated Diagnostic Agents]     Current Outpatient Prescriptions  Medication Sig Dispense Refill  . aspirin 81 MG tablet Take 81 mg by mouth daily.      [name] atorvastatin (LIPITOR) 40 MG tablet Take 1 tablet (40 mg total) by mouth daily.  30 tablet  10  . Cholecalciferol (VITAMIN D-3 PO) Take 750 Units by mouth daily.      [name] diltiazem (DILACOR XR) 240 MG 24 hr capsule Take 240 mg by mouth daily.      . fenofibrate (TRICOR) 145  MG tablet Take 145 mg by mouth daily.      . fish oil-omega-3 fatty acids 1000 MG capsule Take 2 capsules (2 g total) by mouth 2 (two) times daily.  120 capsule  11  . fluticasone (FLONASE) 50 MCG/ACT nasal spray Place 1 spray into the nose 2 (two) times daily.      . metoprolol succinate (TOPROL-XL) 100 MG 24 hr tablet Take 1 tablet (100 mg total) by mouth daily. Take with or immediately following a meal.  30 tablet  11  . pantoprazole (PROTONIX) 40 MG tablet Take 40 mg by mouth 2 (two) times daily.      . prasugrel  (EFFIENT) 10 MG TABS Take 10 mg by mouth daily.      [name] PROAIR HFA 108 (90 BASE) MCG/ACT inhaler Inhale 1 puff into the lungs as needed.      [name] spironolactone (ALDACTONE) 25 MG tablet Take 0.5 tablets (12.5 mg total) by mouth daily.  15 tablet  11  . telmisartan (MICARDIS) 80 MG tablet Take 80 mg by mouth daily.      [name] torsemide (DEMADEX) 20 MG tablet Take 20 mg by mouth daily.      . vitamin C (ASCORBIC ACID) 500 MG tablet Take 500 mg by mouth daily.      . vitamin E 400 UNIT capsule Take 400 Units by mouth daily.       No current facility-administered medications for this visit.    Socially he is married and has 2 children and 4 grandchildren. There is no tobacco alcohol use. Recently he was has been very active.  ROS is negative for fever chills night sweats. He denies wheezing. He denies any further lightheadedness. He denies shortness of breath. There are no palpitations There is no chest pain He denies abdominal pain. He denies bleeding he does note some trace edema in his right lower extremity. He denies rash. He denies tremors. Other system review is negative.  PE BP 160/90  Ht 5\' 10"  (1.778 m)  Wt 196 lb (88.905 kg)  BMI 28.12 kg/m2  General: Alert, oriented, no distress.  Skin: normal turgor, no rashes HEENT: Normocephalic, atraumatic. Pupils round and reactive; sclera anicteric;no lid lag,  Nose without nasal septal hypertrophy Mouth/Parynx benign; Mallinpatti scale 2/3 Neck: No JVD, no carotid briuts Lungs: clear to ausculatation and percussion; no wheezing or rales Heart: RRR, s1 s2 normal 2/6 sem Chest wall has a mild pectus excavatum deformity the Abdomen: soft, nontender; no hepatosplenomehaly, BS+; abdominal aorta nontender and not dilated by palpation. He does have mild diastases recti Pulses 2+ Extremities: Trace edema in the  lower extremities; no clubbing cyanosis, Homan's sign negative  Neurologic: grossly nonfocal  ECG: Normal sinus rhythm at 62. Mild RV  conduction delay. PR interval 204 ms, QTc interval 452 ms.  LABS:  BMET    Component Value Date/Time   NA 141 12/30/2012 0946   K 4.2 12/30/2012 0946   CL 110 12/30/2012 0946   CO2 23 12/30/2012 0946   GLUCOSE 87 12/30/2012 0946   BUN 26* 12/30/2012 0946   CREATININE 1.67* 12/30/2012 0946   CALCIUM 9.2 12/30/2012 0946     Hepatic Function Panel     Component Value Date/Time   PROT 6.3 12/30/2012 0946   ALBUMIN 3.9 12/30/2012 0946   AST 26 12/30/2012 0946   ALT 27 12/30/2012 0946   ALKPHOS 41 12/30/2012 0946   BILITOT 0.5 12/30/2012 0946     CBC No results found for  this basename: wbc,  rbc,  hgb,  hct,  plt,  mcv,  mch,  mchc,  rdw,  neutrabs,  lymphsabs,  monoabs,  eosabs,  basosabs     BNP No results found for this basename: probnp    Lipid Panel     Component Value Date/Time   CHOL 150 12/30/2012 0944   TRIG 151* 12/30/2012 0944   HDL 34* 12/30/2012 0944   CHOLHDL 4.4 12/30/2012 0944   VLDL 30 12/30/2012 0944   LDLCALC 86 12/30/2012 0944     RADIOLOGY: No results found.    ASSESSMENT AND PLAN: The cardiac standpoint, Jorge Cherry feels improved. He denies any episodes of presyncope or syncope. His heart rate is well-controlled on the reduced dose of Cardizem. His blood pressure is stable. His lipid studies have improved with this combination therapy which includes atorvastatin 40 mg, fenofibrate 145 mg, Lovaza 2 capsules twice a day. He's not having any anginal symptoms. His last nuclear perfusion study was one year ago. As long as he remains stable I will see him in 6 months for followup neurologic evaluation to    Lennette Bihari, MD, [facility]  01/07/2013 12:06 PM

## 2013-01-11 ENCOUNTER — Encounter: Payer: Self-pay | Admitting: *Deleted

## 2013-01-11 NOTE — Progress Notes (Signed)
Quick Note:  Note sent to patient ______ 

## 2013-01-19 ENCOUNTER — Other Ambulatory Visit: Payer: Self-pay | Admitting: *Deleted

## 2013-01-19 MED ORDER — PRASUGREL HCL 10 MG PO TABS: 10.0000 mg | ORAL_TABLET | Freq: Every day | ORAL | Status: DC

## 2013-01-19 NOTE — Telephone Encounter (Signed)
effient refilled electronically #30 w/5 refills.

## 2013-02-08 ENCOUNTER — Other Ambulatory Visit: Payer: Self-pay | Admitting: *Deleted

## 2013-02-10 ENCOUNTER — Telehealth: Payer: Self-pay | Admitting: Cardiovascular Disease

## 2013-02-10 NOTE — Telephone Encounter (Signed)
Meed refill on Metoprololl 100 mg #30

## 2013-02-10 NOTE — Telephone Encounter (Signed)
Returned call to pharmacy. Refill confirmed with pharmacy.

## 2013-02-24 ENCOUNTER — Encounter (HOSPITAL_COMMUNITY): Payer: Self-pay | Admitting: *Deleted

## 2013-02-24 ENCOUNTER — Other Ambulatory Visit (HOSPITAL_COMMUNITY): Payer: Self-pay | Admitting: Cardiovascular Disease

## 2013-02-24 ENCOUNTER — Telehealth (HOSPITAL_COMMUNITY): Payer: Self-pay | Admitting: *Deleted

## 2013-02-24 DIAGNOSIS — I701 Atherosclerosis of renal artery: Secondary | ICD-10-CM

## 2013-03-10 ENCOUNTER — Ambulatory Visit (HOSPITAL_COMMUNITY)
Admission: RE | Admit: 2013-03-10 | Discharge: 2013-03-10 | Disposition: A | Discharge status: Home / Self Care | Payer: Medicare Other | Source: Ambulatory Visit | Attending: Cardiovascular Disease | Admitting: Cardiovascular Disease

## 2013-03-10 DIAGNOSIS — I701 Atherosclerosis of renal artery: Secondary | ICD-10-CM | POA: Insufficient documentation

## 2013-03-10 DIAGNOSIS — I10 Essential (primary) hypertension: Secondary | ICD-10-CM

## 2013-03-10 NOTE — Progress Notes (Signed)
Renal Duplex Completed. Ling Flesch, BS, RDMS, RVT  

## 2013-04-03 ENCOUNTER — Other Ambulatory Visit (HOSPITAL_COMMUNITY): Payer: Self-pay | Admitting: Cardiovascular Disease

## 2013-04-03 ENCOUNTER — Other Ambulatory Visit: Payer: Self-pay | Admitting: *Deleted

## 2013-04-03 MED ORDER — TORSEMIDE 20 MG PO TABS: 20.0000 mg | ORAL_TABLET | Freq: Two times a day (BID) | ORAL | Status: DC

## 2013-04-03 NOTE — Telephone Encounter (Signed)
Rx was sent to pharmacy electronically. 

## 2013-05-02 ENCOUNTER — Encounter: Payer: Self-pay | Admitting: *Deleted

## 2013-06-22 ENCOUNTER — Encounter: Payer: Self-pay | Admitting: *Deleted

## 2013-06-22 ENCOUNTER — Other Ambulatory Visit: Payer: Self-pay | Admitting: *Deleted

## 2013-06-22 DIAGNOSIS — I350 Nonrheumatic aortic (valve) stenosis: Secondary | ICD-10-CM

## 2013-06-22 DIAGNOSIS — I2510 Atherosclerotic heart disease of native coronary artery without angina pectoris: Secondary | ICD-10-CM

## 2013-06-22 DIAGNOSIS — R5383 Other fatigue: Secondary | ICD-10-CM

## 2013-06-22 DIAGNOSIS — R5381 Other malaise: Secondary | ICD-10-CM

## 2013-07-12 LAB — COMPREHENSIVE METABOLIC PANEL
ALK PHOS: 50 U/L (ref 39–117)
ALT: 15 U/L (ref 0–53)
AST: 17 U/L (ref 0–37)
Albumin: 4.1 g/dL (ref 3.5–5.2)
BUN: 25 mg/dL — AB (ref 6–23)
CO2: 25 mEq/L (ref 19–32)
CREATININE: 1.49 mg/dL — AB (ref 0.50–1.35)
Calcium: 9.2 mg/dL (ref 8.4–10.5)
Chloride: 107 mEq/L (ref 96–112)
Glucose, Bld: 83 mg/dL (ref 70–99)
POTASSIUM: 4.1 meq/L (ref 3.5–5.3)
Sodium: 141 mEq/L (ref 135–145)
Total Bilirubin: 0.6 mg/dL (ref 0.2–1.2)
Total Protein: 6.4 g/dL (ref 6.0–8.3)

## 2013-07-12 LAB — CBC
HEMATOCRIT: 39.2 % (ref 39.0–52.0)
Hemoglobin: 13.7 g/dL (ref 13.0–17.0)
MCH: 30.2 pg (ref 26.0–34.0)
MCHC: 34.9 g/dL (ref 30.0–36.0)
MCV: 86.3 fL (ref 78.0–100.0)
PLATELETS: 196 10*3/uL (ref 150–400)
RBC: 4.54 MIL/uL (ref 4.22–5.81)
RDW: 13.9 % (ref 11.5–15.5)
WBC: 7.1 10*3/uL (ref 4.0–10.5)

## 2013-07-12 LAB — TSH: TSH: 2.279 u[IU]/mL (ref 0.350–4.500)

## 2013-07-14 LAB — NMR LIPOPROFILE WITH LIPIDS
Cholesterol, Total: 149 mg/dL (ref <200)
HDL PARTICLE NUMBER: 26.3 umol/L — AB (ref >=30.5)
HDL Size: 8 nm — ABNORMAL LOW (ref >=9.2)
HDL-C: 35 mg/dL — AB (ref >=40)
LDL (calc): 80 mg/dL (ref <100)
LDL Particle Number: 1629 nmol/L — ABNORMAL HIGH (ref <1000)
LDL Size: 19.8 nm — ABNORMAL LOW (ref >20.5)
LP-IR Score: 64 — ABNORMAL HIGH (ref <=45)
Large VLDL-P: 2.3 nmol/L (ref <=2.7)
Small LDL Particle Number: 1290 nmol/L — ABNORMAL HIGH (ref <=527)
TRIGLYCERIDES: 169 mg/dL — AB (ref <150)
VLDL Size: 43.5 nm (ref <=46.6)

## 2013-07-18 ENCOUNTER — Encounter: Payer: Self-pay | Admitting: Cardiovascular Disease

## 2013-07-18 ENCOUNTER — Ambulatory Visit (INDEPENDENT_AMBULATORY_CARE_PROVIDER_SITE_OTHER): Payer: Medicare Other | Admitting: Cardiovascular Disease

## 2013-07-18 VITALS — BP 140/92 | HR 59 | Ht 70.0 in | Wt 204.9 lb

## 2013-07-18 DIAGNOSIS — H9190 Unspecified hearing loss, unspecified ear: Secondary | ICD-10-CM

## 2013-07-18 DIAGNOSIS — I2510 Atherosclerotic heart disease of native coronary artery without angina pectoris: Secondary | ICD-10-CM

## 2013-07-18 DIAGNOSIS — R609 Edema, unspecified: Secondary | ICD-10-CM

## 2013-07-18 DIAGNOSIS — E785 Hyperlipidemia, unspecified: Secondary | ICD-10-CM

## 2013-07-18 DIAGNOSIS — E782 Mixed hyperlipidemia: Secondary | ICD-10-CM

## 2013-07-18 DIAGNOSIS — Z79899 Other long term (current) drug therapy: Secondary | ICD-10-CM

## 2013-07-18 DIAGNOSIS — I350 Nonrheumatic aortic (valve) stenosis: Secondary | ICD-10-CM

## 2013-07-18 DIAGNOSIS — Z951 Presence of aortocoronary bypass graft: Secondary | ICD-10-CM

## 2013-07-18 DIAGNOSIS — I119 Hypertensive heart disease without heart failure: Secondary | ICD-10-CM

## 2013-07-18 DIAGNOSIS — I359 Nonrheumatic aortic valve disorder, unspecified: Secondary | ICD-10-CM

## 2013-07-18 NOTE — Patient Instructions (Signed)
Your physician recommends that you schedule a follow-up appointment in: 4 Months  Your physician recommends that you return for lab work in: NMR Lipids, CMP

## 2013-07-19 ENCOUNTER — Other Ambulatory Visit: Payer: Self-pay

## 2013-07-19 MED ORDER — PRASUGREL HCL 10 MG PO TABS: 10.0000 mg | ORAL_TABLET | Freq: Every day | ORAL | Status: DC

## 2013-07-19 NOTE — Telephone Encounter (Signed)
Rx was sent to pharmacy electronically. 

## 2013-07-22 ENCOUNTER — Encounter: Payer: Self-pay | Admitting: Cardiovascular Disease

## 2013-07-22 DIAGNOSIS — I350 Nonrheumatic aortic (valve) stenosis: Secondary | ICD-10-CM | POA: Insufficient documentation

## 2013-07-22 NOTE — Progress Notes (Signed)
Patient ID: Jorge Cherry, male   DOB: 06/14/1935, 78 y.o.   MRN: [id number]     HPI: Jorge Cherry is a 78 y.o. male  who presents to the office today for a 6 month followup cardiology evaluation.   Jorge Cherry has established coronary artery disease dating back to 1994 at which time he underwent CABG revascularization surgery. In October 2003 he underwent stenting to the proximal portion of the vein graft supplying the diagonal vessel with a 3.5x16 mm Taxus DES stent post dilated to 4.0 mm. He has diffuse disease in the distal apical portion of the LAD beyond the LIMA insertion  treated medically. He has documented mild aortic valve stenosis with grade 2 diastolic dysfunction with concentric left ventricular hypertrophy. He has documented renal cysts, history of hypertension, mixed hyperlipidemia. His last Myoview study was in June 2013 which showed a minimal apical defect. Post-stess ejection fraction was 56%.  When I saw him in March 2014 he was complaining that at times he felt like he was "zoning out."  At that time, I reduced his diltiazem from 300 mg to 240 mg. He felt that this has significantly improved his symptoms with this change and he denies any further sensation. Her last echo Doppler study, his peak instantaneous gradient across his aortic valve is 25 mm with a mean gradient of only 12 mm an aortic valve area 1.8 cm. He presents today for followup evaluation.  He has hyperlipidemia and in June 2014 his triglycerides were 231 and I further titrated his fish oil to 2 capsules twice a day. He did have repeat blood work in August 2014 week which showed a BUN of 26 Cr1.67 which improved from  1.71 in June. His lipid panel was improved with a total cholesterol from 172-150. Triglycerides improved from 231-151. HDL remained low at 34. LDL is 86.  He does have hearing loss. He denies chest pain. He denies PND or orthopnea. He does note some occasional leg swelling.  Over the past 6 months, he has  continued to do well. He has been taking Lipitor only 20 mg daily. With reference for blood pressure control he has been taking Aldactone 12.5 mg Micardis 80 mg torsemide 20 mg twice a day, Toprol-XL 100 mg. He also has been taking fenofibrate 145 mg, 2 capsules twice a day in addition to his atorvastatin. He is unaware of recurrent anginal symptoms. He denies significant shortness of breath. He denies palpitations.  Past Medical History  Diagnosis Date  . Aortic valve disorder     2D ECHO, 10/19/2011 - EF >55%, LA mild-moderately dilated, mild-moderate tricuspid regurgitation, mild-moderate valvular aortic stenosis, moderate calcification of aortic valve leaflets  . DOE (dyspnea on exertion)     LEXISCAN, 11/05/2011 - Non-diagnositc for ischemia, post-stress EF 56%, small fixed apical inferior, apical lateral defect which may represent scar  . Atherosclerosis of renal artery     RENAL DOPPLER, 12/10/2011 - Left renal artery demonstrated narrowing with elevated velocities consistent with a 1-59% diameter reduction    Past Surgical History  Procedure Laterality Date  . Cardiac catheterization  02/27/2004    Coronary intervention and medical management  . Cardiac catheterization  03/04/2004    SVG supplying the diagonal vessel stented with a 3.5x2916mm Taxus stent post dilated to 4.0 mm    Allergies  Allergen Reactions  . Contrast Media [Iodinated Diagnostic Agents]     Current Outpatient Prescriptions  Medication Sig Dispense Refill  . aspirin 81 MG tablet  Take 81 mg by mouth daily.      [name] atorvastatin (LIPITOR) 40 MG tablet Take 40 mg by mouth daily.       . Cholecalciferol (VITAMIN D-3 PO) Take 750 Units by mouth daily.      [name] diltiazem (DILACOR XR) 240 MG 24 hr capsule Take 240 mg by mouth daily.      . fenofibrate (TRICOR) 145 MG tablet TAKE 1 TABLET DAILY.  30 tablet  5  . fish oil-omega-3 fatty acids 1000 MG capsule Take 2 capsules (2 g total) by mouth 2 (two) times daily.  120 capsule   11  . fluticasone (FLONASE) 50 MCG/ACT nasal spray Place 1 spray into the nose 2 (two) times daily.      . metoprolol succinate (TOPROL-XL) 100 MG 24 hr tablet Take 1 tablet (100 mg total) by mouth daily. Take with or immediately following a meal.  30 tablet  11  . pantoprazole (PROTONIX) 40 MG tablet Take 40 mg by mouth 2 (two) times daily.      [name] PROAIR HFA 108 (90 BASE) MCG/ACT inhaler Inhale 1 puff into the lungs as needed.      [name] spironolactone (ALDACTONE) 25 MG tablet Take 0.5 tablets (12.5 mg total) by mouth daily.  15 tablet  11  . telmisartan (MICARDIS) 80 MG tablet Take 80 mg by mouth daily.      [name] torsemide (DEMADEX) 20 MG tablet Take 1 tablet (20 mg total) by mouth 2 (two) times daily.  60 tablet  9  . vitamin C (ASCORBIC ACID) 500 MG tablet Take 500 mg by mouth daily.      . vitamin E 400 UNIT capsule Take 400 Units by mouth daily.      . prasugrel (EFFIENT) 10 MG TABS tablet Take 1 tablet (10 mg total) by mouth daily.  30 tablet  11   No current facility-administered medications for this visit.    Socially he is married and has 2 children and 4 grandchildren. There is no tobacco alcohol use. Recently he was has been very active.  ROS is negative for fever chills night sweats. He is hard of hearing. He denies change in weight. He denies change in vision He denies wheezing. He denies any further lightheadedness. He denies shortness of breath. There are no palpitations There is no chest pain He denies abdominal pain. There is no nausea vomiting or diarrhea. He denies blood in stool or urine. He denies bleeding.  he does note some trace edema in his right lower extremity. He denies rash. He denies tremors. There is no history of diabetes. He denies cold or heat intolerance. Other comprehensive 14 point system review is negative.  PE BP 140/92  Pulse 59  Ht 5\' 10"  (1.778 m)  Wt 204 lb 14.4 oz (92.942 kg)  BMI 29.40 kg/m2  General: Alert, oriented, no distress.  Skin: normal  turgor, no rashes HEENT: Normocephalic, atraumatic. Pupils round and reactive; sclera anicteric;no lid lag,  Nose without nasal septal hypertrophy Mouth/Parynx benign; Mallinpatti scale 2/3 Neck: No JVD, no carotid bruits Lungs: clear to ausculatation and percussion; no wheezing or rales Heart: RRR, s1 s2 normal 2/6 sem in the aortic region. No S3 gallop. No diastolic murmur. No rubs thrills or heaves. Chest wall has a mild pectus excavatum deformity the Abdomen: soft, nontender; no hepatosplenomehaly, BS+; abdominal aorta nontender and not dilated by palpation. He does have mild diastases recti : No CVA Pulses 2+ Extremities: Trace edema in the  lower extremities; no clubbing cyanosis, Homan's sign negative  Neurologic: grossly nonfocal  ECG (independently read by me): Sinus rhythm at 59 beats per minute. Mild LVH by voltage criteria in aVL. Normal intervals.  Prior 01/05/2013 ECG: Normal sinus rhythm at 62. Mild RV conduction delay. PR interval 204 ms, QTc interval 452 ms.  LABS:  BMET    Component Value Date/Time   NA 141 07/12/2013 1038   K 4.1 07/12/2013 1038   CL 107 07/12/2013 1038   CO2 25 07/12/2013 1038   GLUCOSE 83 07/12/2013 1038   BUN 25* 07/12/2013 1038   CREATININE 1.49* 07/12/2013 1038   CALCIUM 9.2 07/12/2013 1038     Hepatic Function Panel     Component Value Date/Time   PROT 6.4 07/12/2013 1038   ALBUMIN 4.1 07/12/2013 1038   AST 17 07/12/2013 1038   ALT 15 07/12/2013 1038   ALKPHOS 50 07/12/2013 1038   BILITOT 0.6 07/12/2013 1038     CBC    Component Value Date/Time   WBC 7.1 07/12/2013 1040     BNP No results found for this basename: probnp    Lipid Panel     Component Value Date/Time   CHOL 150 12/30/2012 0944   TRIG 169* 07/12/2013 1035   TRIG 151* 12/30/2012 0944   HDL 34* 12/30/2012 0944   CHOLHDL 4.4 12/30/2012 0944   VLDL 30 12/30/2012 0944   LDLCALC 80 07/12/2013 1035   LDLCALC 86 12/30/2012 0944     RADIOLOGY: No results found.    ASSESSMENT AND  PLAN: Jorge Cherry continues to be fairly stable from a cardiovascular perspective. He is on combination therapy for hypertension including beta blocker, ARB, and diuretic. His blood pressure today is stable and on repeat by me was 140/80. I did review his recent NMR LipoProfile which showed still an increased LDL particle number of 1629, increased triglycerides at 169, LDL cholesterol 80, HDL cholesterol 35, and HDL particle number low at 26.3. His insulin resistance score is elevated at 64. I am further titrating his atorvastatin 40 mg daily. We discussed increased exercise. He does have a aortic stenosis murmur. In 4 months he will undergo a one-year followup evaluation of his cardiac murmur and aortic stenosis. I'll see him back in the office for followup evaluation. Repeat laboratory will be obtained in 3 months.  Jorge Bihari, MD, [facility]  07/22/2013 2:43 PM

## 2013-07-26 ENCOUNTER — Ambulatory Visit (HOSPITAL_COMMUNITY)
Admission: RE | Admit: 2013-07-26 | Discharge: 2013-07-26 | Disposition: A | Discharge status: Home / Self Care | Payer: Medicare Other | Source: Ambulatory Visit | Attending: Cardiovascular Disease | Admitting: Cardiovascular Disease

## 2013-07-26 DIAGNOSIS — I359 Nonrheumatic aortic valve disorder, unspecified: Secondary | ICD-10-CM | POA: Insufficient documentation

## 2013-07-26 DIAGNOSIS — I350 Nonrheumatic aortic (valve) stenosis: Secondary | ICD-10-CM

## 2013-07-26 NOTE — Progress Notes (Signed)
2D Echo Performed 07/26/2013    Tija Biss, RCS  

## 2013-08-10 ENCOUNTER — Encounter: Payer: Self-pay | Admitting: *Deleted

## 2013-10-09 ENCOUNTER — Other Ambulatory Visit: Payer: Self-pay | Admitting: *Deleted

## 2013-10-09 ENCOUNTER — Telehealth: Payer: Self-pay | Admitting: *Deleted

## 2013-10-09 MED ORDER — DILTIAZEM HCL ER 240 MG PO CP24: 240.0000 mg | ORAL_CAPSULE | Freq: Every day | ORAL | Status: DC

## 2013-10-09 NOTE — Telephone Encounter (Signed)
Rx was sent to pharmacy electronically. 

## 2013-10-09 NOTE — Telephone Encounter (Signed)
Notified patient. Medication was e-sent to pharmacy about 1 hour ago.

## 2013-12-25 ENCOUNTER — Other Ambulatory Visit: Payer: Self-pay | Admitting: *Deleted

## 2013-12-25 MED ORDER — METOPROLOL SUCCINATE ER 100 MG PO TB24: 100.0000 mg | ORAL_TABLET | Freq: Every day | ORAL | Status: DC

## 2014-01-06 ENCOUNTER — Other Ambulatory Visit: Payer: Self-pay | Admitting: Cardiovascular Disease

## 2014-01-08 NOTE — Telephone Encounter (Signed)
Rx was sent to pharmacy electronically. 

## 2014-02-01 ENCOUNTER — Other Ambulatory Visit: Payer: Self-pay | Admitting: Cardiovascular Disease

## 2014-02-02 NOTE — Telephone Encounter (Signed)
Rx was sent to pharmacy electronically. 

## 2014-03-08 ENCOUNTER — Other Ambulatory Visit: Payer: Self-pay

## 2014-03-08 MED ORDER — FENOFIBRATE 145 MG PO TABS: 145.0000 mg | ORAL_TABLET | Freq: Every day | ORAL | Status: DC

## 2014-03-08 NOTE — Telephone Encounter (Signed)
Rx sent to pharmacy   

## 2014-03-20 ENCOUNTER — Telehealth: Payer: Self-pay | Admitting: Cardiovascular Disease

## 2014-03-20 NOTE — Telephone Encounter (Signed)
Please advise 

## 2014-03-20 NOTE — Telephone Encounter (Signed)
Pt's wife called in stating that he usually has labs done before coming to see Dr. Tresa EndoKelly. Please put orders in for pt and notify him of when they are in the system  Thanks

## 2014-03-21 NOTE — Telephone Encounter (Signed)
Lipid, CMET, TSH, PSA done by PCP in June - [facility]  Labs ordered at last OV in March (per AVS) - NMR with lipids and CMET - only NMR order was placed - but patient did not get labs.   Please advise on what/if any labs are necessary Appointment is 04/12/2014

## 2014-03-23 ENCOUNTER — Other Ambulatory Visit: Payer: Self-pay | Admitting: *Deleted

## 2014-03-23 DIAGNOSIS — I2510 Atherosclerotic heart disease of native coronary artery without angina pectoris: Secondary | ICD-10-CM

## 2014-03-23 DIAGNOSIS — E782 Mixed hyperlipidemia: Secondary | ICD-10-CM

## 2014-03-23 DIAGNOSIS — I350 Nonrheumatic aortic (valve) stenosis: Secondary | ICD-10-CM

## 2014-03-23 DIAGNOSIS — I10 Essential (primary) hypertension: Secondary | ICD-10-CM

## 2014-03-23 NOTE — Telephone Encounter (Signed)
Cmet, NMR cbc, tsh

## 2014-03-23 NOTE — Telephone Encounter (Signed)
Labs ordered and slips mailed to patient. 

## 2014-04-10 LAB — CBC
HEMATOCRIT: 39.7 % (ref 39.0–52.0)
HEMOGLOBIN: 13.4 g/dL (ref 13.0–17.0)
MCH: 30.5 pg (ref 26.0–34.0)
MCHC: 33.8 g/dL (ref 30.0–36.0)
MCV: 90.2 fL (ref 78.0–100.0)
Platelets: 223 10*3/uL (ref 150–400)
RBC: 4.4 MIL/uL (ref 4.22–5.81)
RDW: 13.5 % (ref 11.5–15.5)
WBC: 6.5 10*3/uL (ref 4.0–10.5)

## 2014-04-10 LAB — COMPREHENSIVE METABOLIC PANEL
ALT: 15 U/L (ref 0–53)
AST: 18 U/L (ref 0–37)
Albumin: 4 g/dL (ref 3.5–5.2)
Alkaline Phosphatase: 46 U/L (ref 39–117)
BUN: 28 mg/dL — ABNORMAL HIGH (ref 6–23)
CALCIUM: 9.1 mg/dL (ref 8.4–10.5)
CO2: 25 mEq/L (ref 19–32)
CREATININE: 1.67 mg/dL — AB (ref 0.50–1.35)
Chloride: 106 mEq/L (ref 96–112)
Glucose, Bld: 85 mg/dL (ref 70–99)
Potassium: 4.2 mEq/L (ref 3.5–5.3)
Sodium: 140 mEq/L (ref 135–145)
TOTAL PROTEIN: 6.5 g/dL (ref 6.0–8.3)
Total Bilirubin: 0.4 mg/dL (ref 0.2–1.2)

## 2014-04-10 LAB — TSH: TSH: 3.007 u[IU]/mL (ref 0.350–4.500)

## 2014-04-11 LAB — NMR LIPOPROFILE WITH LIPIDS
Cholesterol, Total: 137 mg/dL (ref 100–199)
HDL Particle Number: 32.3 umol/L (ref >=30.5)
HDL SIZE: 8.4 nm — AB (ref >=9.2)
HDL-C: 32 mg/dL — ABNORMAL LOW (ref >39)
LARGE VLDL-P: 4.4 nmol/L — AB (ref <=2.7)
LDL (calc): 69 mg/dL (ref 0–99)
LDL Particle Number: 1388 nmol/L — ABNORMAL HIGH (ref <1000)
LDL Size: 19.8 nm (ref >=20.8)
LP-IR Score: 77 — ABNORMAL HIGH (ref <=45)
SMALL LDL PARTICLE NUMBER: 1038 nmol/L — AB (ref <=527)
TRIGLYCERIDES: 182 mg/dL — AB (ref 0–149)
VLDL SIZE: 49.5 nm — AB (ref <=46.6)

## 2014-04-12 ENCOUNTER — Encounter: Payer: Self-pay | Admitting: Cardiovascular Disease

## 2014-04-12 ENCOUNTER — Ambulatory Visit (INDEPENDENT_AMBULATORY_CARE_PROVIDER_SITE_OTHER): Payer: Medicare Other | Admitting: Cardiovascular Disease

## 2014-04-12 VITALS — BP 142/74 | HR 57 | Ht 70.0 in | Wt 207.2 lb

## 2014-04-12 DIAGNOSIS — I119 Hypertensive heart disease without heart failure: Secondary | ICD-10-CM

## 2014-04-12 DIAGNOSIS — I2510 Atherosclerotic heart disease of native coronary artery without angina pectoris: Secondary | ICD-10-CM

## 2014-04-12 DIAGNOSIS — E785 Hyperlipidemia, unspecified: Secondary | ICD-10-CM

## 2014-04-12 DIAGNOSIS — Z951 Presence of aortocoronary bypass graft: Secondary | ICD-10-CM

## 2014-04-12 DIAGNOSIS — Z79899 Other long term (current) drug therapy: Secondary | ICD-10-CM

## 2014-04-12 DIAGNOSIS — I350 Nonrheumatic aortic (valve) stenosis: Secondary | ICD-10-CM

## 2014-04-12 MED ORDER — TORSEMIDE 20 MG PO TABS: 20.0000 mg | ORAL_TABLET | Freq: Two times a day (BID) | ORAL | Status: DC

## 2014-04-12 MED ORDER — DILTIAZEM HCL ER 240 MG PO CP24: 240.0000 mg | ORAL_CAPSULE | Freq: Every day | ORAL | Status: DC

## 2014-04-12 MED ORDER — EZETIMIBE 10 MG PO TABS: 10.0000 mg | ORAL_TABLET | Freq: Every day | ORAL | Status: DC

## 2014-04-12 MED ORDER — METOPROLOL SUCCINATE ER 100 MG PO TB24: 100.0000 mg | ORAL_TABLET | Freq: Every day | ORAL | Status: DC

## 2014-04-12 NOTE — Patient Instructions (Signed)
Dr.Kelly wants you to follow-up in: SIX months You will receive a reminder letter in the mail two months in advance. If you don't receive a letter, please call our office to schedule the follow-up appointment.  CHANGE your Diltizem dose to before bedtime  START Zetia 10mg  Daily  Your physician recommends that you return for lab work before your next appointment

## 2014-04-12 NOTE — Progress Notes (Signed)
Patient ID: [name], male   DOB: [birth year], 78 y.o.   MRN: [id number]     HPI: Farhan TRUNG WENZL is a 78 y.o. male  who presents to the office today for a 9 month followup cardiology evaluation.   Mr. Cruise has established coronary artery disease dating back to 1994 at which time he underwent CABG revascularization surgery. In October 2003 he underwent stenting to the proximal portion of the vein graft supplying the diagonal vessel with a 3.5x16 mm Taxus DES stent post dilated to 4.0 mm. He has diffuse disease in the distal apical portion of the LAD beyond the LIMA insertion  treated medically. He has documented mild aortic valve stenosis with grade 2 diastolic dysfunction with concentric left ventricular hypertrophy. He has documented renal cysts, history of hypertension, mixed hyperlipidemia. His last Myoview study was in June 2013 which showed a minimal apical defect. Post-stess ejection fraction was 56%.  When I saw him in March 2014 he was complaining that at times he felt like he was "zoning out."  At that time, I reduced his diltiazem from 300 mg to 240 mg. He felt that this has significantly improved his symptoms with this change and he denies any further sensation. Her last echo Doppler study, his peak instantaneous gradient across his aortic valve is 25 mm with a mean gradient of only 12 mm an aortic valve area 1.8 cm. He presents today for followup evaluation.  He has hyperlipidemia and in June 2014 his triglycerides were 231 and I further titrated his fish oil to 2 capsules twice a day. He did have repeat blood work in August 2014 week which showed a BUN of 26 Cr1.67 which improved from  1.71 in June. His lipid panel was improved with a total cholesterol from 172-150. Triglycerides improved from 231-151. HDL remained low at 34. LDL is 86.  He does have hearing loss. He denies chest pain. He denies PND or orthopnea. He does note some occasional leg swelling.  Since I last saw him in March  2015, he underwent a follow-up echo Doppler study on 07/26/2013.  This showed an ejection fraction of 55-60%.  He had normal diastolic function.  There was evidence for mild aortic valve stenosis with a mean gradient of 11 and a peak gradient of 21 with an estimated aortic valve area of 1.54 cm.  He had mild left atrial dilatation.  Over the past 9 months, he has continued to do well. With reference for blood pressure control he has been taking Aldactone 12.5 mg, Micardis 80 mg torsemide 20 mg twice a day, Toprol-XL 100 mg. with reference to his hyperlipidemia, he has been taking atorvastatin 20 mg in addition to fenofibrate 145 mg, 2 capsules twice a day. He is unaware of recurrent anginal symptoms. He denies significant shortness of breath. He denies palpitations.  He underwent recent blood work 3 days ago.  Hemoglobin and hematocrit were stable.  He has renal insufficiency and his creatinine was 1.67, which was unchanged from one year ago, but slightly increased from 9 months ago.  Liver function studies were normal.  An NMR profile continued to show increased LDL particle #1388 despite a calculated LDL of 69.  Triglycerides were still elevated at 182 and HDL cholesterol was low at 32.  Insulin resistance score was increased at 77.  TSH was normal.  Past Medical History  Diagnosis Date  . Aortic valve disorder     2D ECHO, 10/19/2011 - EF >55%, LA mild-moderately  dilated, mild-moderate tricuspid regurgitation, mild-moderate valvular aortic stenosis, moderate calcification of aortic valve leaflets  . DOE (dyspnea on exertion)     LEXISCAN, 11/05/2011 - Non-diagnositc for ischemia, post-stress EF 56%, small fixed apical inferior, apical lateral defect which may represent scar  . Atherosclerosis of renal artery     RENAL DOPPLER, 12/10/2011 - Left renal artery demonstrated narrowing with elevated velocities consistent with a 1-59% diameter reduction    Past Surgical History  Procedure Laterality Date    . Cardiac catheterization  02/27/2004    Coronary intervention and medical management  . Cardiac catheterization  03/04/2004    SVG supplying the diagonal vessel stented with a 3.5x3516mm Taxus stent post dilated to 4.0 mm    Allergies  Allergen Reactions  . Contrast Media [Iodinated Diagnostic Agents]     Current Outpatient Prescriptions  Medication Sig Dispense Refill  . aspirin 81 MG tablet Take 81 mg by mouth daily.    [name]. atorvastatin (LIPITOR) 40 MG tablet Take 20 mg by mouth daily.    . Cholecalciferol (VITAMIN D-3 PO) Take 750 Units by mouth daily.    [name]. diltiazem (DILACOR XR) 240 MG 24 hr capsule Take 1 capsule (240 mg total) by mouth daily. 30 capsule 8  . DYMISTA 137-50 MCG/ACT SUSP Place 1 spray into both nostrils daily.    . fenofibrate (TRICOR) 145 MG tablet Take 145 mg by mouth. 1/2 tablet daily    . fish oil-omega-3 fatty acids 1000 MG capsule Take 2 capsules (2 g total) by mouth 2 (two) times daily. 120 capsule 11  . FLUZONE HIGH-DOSE 0.5 ML SUSY     . loratadine (CLARITIN) 10 MG tablet Take 10 mg by mouth daily.    . metoprolol succinate (TOPROL-XL) 100 MG 24 hr tablet Take 1 tablet (100 mg total) by mouth daily. Take with or immediately following a meal. 30 tablet 3  . pantoprazole (PROTONIX) 40 MG tablet Take 40 mg by mouth 2 (two) times daily.    . prasugrel (EFFIENT) 10 MG TABS tablet Take 1 tablet (10 mg total) by mouth daily. 30 tablet 11  . PROAIR HFA 108 (90 BASE) MCG/ACT inhaler Inhale 1 puff into the lungs as needed.    [name]. QVAR 80 MCG/ACT inhaler Inhale 2 puffs into the lungs daily.  1  . spironolactone (ALDACTONE) 25 MG tablet TAKE 0.5 TABLETS (12.5 MG TOTAL) BY MOUTH DAILY. 15 tablet 7  . telmisartan (MICARDIS) 80 MG tablet Take 80 mg by mouth daily.    [name]. torsemide (DEMADEX) 20 MG tablet Take 1 tablet (20 mg total) by mouth 2 (two) times daily. 60 tablet 9  . vitamin C (ASCORBIC ACID) 500 MG tablet Take 500 mg by mouth daily.    . vitamin E 400 UNIT capsule  Take 400 Units by mouth daily.    . Zinc 50 MG CAPS Take 1 capsule by mouth 2 (two) times a week.     No current facility-administered medications for this visit.    Socially he is married and has 2 children and 4 grandchildren. There is no tobacco alcohol use. Recently he was has been very active.  ROS is negative for fever chills night sweats. He is hard of hearing. He denies change in weight. He denies change in vision He denies wheezing. He denies any further lightheadedness. He denies shortness of breath. There are no palpitations There is no chest pain He denies abdominal pain. There is no nausea vomiting or diarrhea. He denies blood in  stool or urine. He denies bleeding.  he does note some trace edema in his right lower extremity. He denies rash. He denies tremors. There is no history of diabetes. He denies cold or heat intolerance. Other comprehensive 14 point system review is negative.  PE BP 142/74 mmHg  Pulse 57  Ht 5\' 10"  (1.778 m)  Wt 207 lb 3.2 oz (93.985 kg)  BMI 29.73 kg/m2  General: Alert, oriented, no distress.  Skin: normal turgor, no rashes HEENT: Normocephalic, atraumatic. Pupils round and reactive; sclera anicteric;no lid lag,  Nose without nasal septal hypertrophy Mouth/Parynx benign; Mallinpatti scale 2/3 Neck: No JVD, no carotid bruits Lungs: clear to ausculatation and percussion; no wheezing or rales Heart: RRR, s1 s2 normal 2/6 sem in the aortic region. No S3 gallop. No diastolic murmur. No rubs thrills or heaves. Chest wall has a mild pectus excavatum deformity  Abdomen: soft, nontender; no hepatosplenomehaly, BS+; abdominal aorta nontender and not dilated by palpation. He does have mild diastases recti Back: No CVA tenderness Pulses 2+ Extremities: Trace edema in the  lower extremities; no clubbing cyanosis, Homan's sign negative  Neurologic: grossly nonfocal; cranial nerves normal Psychological: Normal affect and mood  ECG (independently read by me).  For  these: Sinus bradycardia 57 bpm.  First-degree AV block with a PR interval at 224 ms.  No significant ST-T changes.   March 2015 ECG (independently read by me): Sinus rhythm at 59 beats per minute. Mild LVH by voltage criteria in aVL. Normal intervals.  Prior 01/05/2013 ECG: Normal sinus rhythm at 62. Mild RV conduction delay. PR interval 204 ms, QTc interval 452 ms.  LABS:  BMET    Component Value Date/Time   NA 140 04/09/2014 1115   K 4.2 04/09/2014 1115   CL 106 04/09/2014 1115   CO2 25 04/09/2014 1115   GLUCOSE 85 04/09/2014 1115   BUN 28* 04/09/2014 1115   CREATININE 1.67* 04/09/2014 1115   CALCIUM 9.1 04/09/2014 1115     Hepatic Function Panel     Component Value Date/Time   PROT 6.5 04/09/2014 1115   ALBUMIN 4.0 04/09/2014 1115   AST 18 04/09/2014 1115   ALT 15 04/09/2014 1115   ALKPHOS 46 04/09/2014 1115   BILITOT 0.4 04/09/2014 1115     CBC    Component Value Date/Time   WBC 6.5 04/09/2014 1115     BNP No results found for: PROBNP  Lipid Panel     Component Value Date/Time   CHOL 150 12/30/2012 0944   TRIG 182* 04/09/2014 1115   TRIG 151* 12/30/2012 0944   HDL 34* 12/30/2012 0944   CHOLHDL 4.4 12/30/2012 0944   VLDL 30 12/30/2012 0944   LDLCALC 69 04/09/2014 1115   LDLCALC 86 12/30/2012 0944     RADIOLOGY: No results found.    ASSESSMENT AND PLAN: Mr. Peckham is a 78 year old white male who is now 21 years status post CABG revascularization surgery.  He is not having any recurrent anginal symptoms and is status post DES stenting to the graft supplying the diagonal vessel 12 years ago.  I reviewed his echo Doppler study with him in detail, which confirms mild aortic valve stenosis with normal systolic function.  He is on combination therapy for hypertension including beta blocker, ARB, and diuretic. His blood pressure today is stable and on repeat by me was 140/74. I did review his recent NMR LipoProfile which showed an increased LDL particle  number despite his current therapy.  I'm electing to add  Zetia 10 mg to his current dose of atorvastatin as well as fenofibrate and fish oil.  This should provide an additional 20% lowering of his LDL particles.  He continues to be on Effient for antiplatelet therapy and is tolerating this well.  I discussed with him the natural history of aortic valve stenosis and specifically potential symptom development.  His rhythm is stable.  He's not having any presyncope or syncope.  He is well compensated without CHF.  I will see him in 6 months for cardiology reevaluation.  Lennette Biharihomas A. Kelly, MD, [facility]  04/12/2014 4:39 PM

## 2014-07-03 ENCOUNTER — Telehealth: Payer: Self-pay | Admitting: Cardiovascular Disease

## 2014-07-03 NOTE — Telephone Encounter (Signed)
Spoke with pt wife, aware labs need to be completed prior to 6 months appointment.

## 2014-07-03 NOTE — Telephone Encounter (Signed)
Calling about the lab form that was sent to them a, wants to be sure that its right and when he needs to get the labs done . Please call  Thanks

## 2014-07-29 ENCOUNTER — Other Ambulatory Visit: Payer: Self-pay | Admitting: Cardiovascular Disease

## 2014-07-30 MED ORDER — PRASUGREL HCL 10 MG PO TABS: 10.0000 mg | ORAL_TABLET | Freq: Every day | ORAL | Status: DC

## 2014-07-30 NOTE — Telephone Encounter (Signed)
Spoke with ann, aware scripts sent to pharmacy electronically.

## 2014-07-30 NOTE — Telephone Encounter (Signed)
°  1. Which medications need to be refilled? Effient and Diltiazem  2. Which pharmacy is medication to be [place] [facility] [facility] [place]  3. Do they need a 30 day or 90 day supply? 30  4. Would they like a call back once the medication has been sent to the pharmacy? Yes,

## 2014-09-24 ENCOUNTER — Encounter (HOSPITAL_COMMUNITY): Payer: Self-pay | Admitting: Nurse Practitioner

## 2014-09-24 ENCOUNTER — Inpatient Hospital Stay (HOSPITAL_COMMUNITY)
Admission: EM | Admit: 2014-09-24 | Discharge: 2014-09-26 | DRG: 378 | Disposition: A | Discharge status: Home / Self Care | Payer: Medicare Other | Attending: Internal Medicine | Admitting: Internal Medicine

## 2014-09-24 DIAGNOSIS — I1310 Hypertensive heart and chronic kidney disease without heart failure, with stage 1 through stage 4 chronic kidney disease, or unspecified chronic kidney disease: Secondary | ICD-10-CM | POA: Diagnosis present

## 2014-09-24 DIAGNOSIS — D649 Anemia, unspecified: Secondary | ICD-10-CM | POA: Diagnosis not present

## 2014-09-24 DIAGNOSIS — E785 Hyperlipidemia, unspecified: Secondary | ICD-10-CM | POA: Diagnosis present

## 2014-09-24 DIAGNOSIS — N183 Chronic kidney disease, stage 3 (moderate): Secondary | ICD-10-CM | POA: Diagnosis present

## 2014-09-24 DIAGNOSIS — I350 Nonrheumatic aortic (valve) stenosis: Secondary | ICD-10-CM

## 2014-09-24 DIAGNOSIS — Z91041 Radiographic dye allergy status: Secondary | ICD-10-CM

## 2014-09-24 DIAGNOSIS — I119 Hypertensive heart disease without heart failure: Secondary | ICD-10-CM | POA: Diagnosis present

## 2014-09-24 DIAGNOSIS — Z951 Presence of aortocoronary bypass graft: Secondary | ICD-10-CM

## 2014-09-24 DIAGNOSIS — K922 Gastrointestinal hemorrhage, unspecified: Secondary | ICD-10-CM | POA: Diagnosis not present

## 2014-09-24 DIAGNOSIS — N179 Acute kidney failure, unspecified: Secondary | ICD-10-CM | POA: Diagnosis present

## 2014-09-24 DIAGNOSIS — Z7982 Long term (current) use of aspirin: Secondary | ICD-10-CM | POA: Diagnosis not present

## 2014-09-24 DIAGNOSIS — K625 Hemorrhage of anus and rectum: Secondary | ICD-10-CM

## 2014-09-24 DIAGNOSIS — I2510 Atherosclerotic heart disease of native coronary artery without angina pectoris: Secondary | ICD-10-CM | POA: Diagnosis present

## 2014-09-24 DIAGNOSIS — K5731 Diverticulosis of large intestine without perforation or abscess with bleeding: Principal | ICD-10-CM | POA: Diagnosis present

## 2014-09-24 DIAGNOSIS — I4510 Unspecified right bundle-branch block: Secondary | ICD-10-CM | POA: Diagnosis present

## 2014-09-24 LAB — CBC
HCT: 35.8 % — ABNORMAL LOW (ref 39.0–52.0)
HCT: 34.8 % — ABNORMAL LOW (ref 39.0–52.0)
HEMOGLOBIN: 11.2 g/dL — AB (ref 13.0–17.0)
Hemoglobin: 11.7 g/dL — ABNORMAL LOW (ref 13.0–17.0)
MCH: 30.3 pg (ref 26.0–34.0)
MCH: 29.9 pg (ref 26.0–34.0)
MCHC: 32.7 g/dL (ref 30.0–36.0)
MCHC: 32.2 g/dL (ref 30.0–36.0)
MCV: 92.7 fL (ref 78.0–100.0)
MCV: 93 fL (ref 78.0–100.0)
PLATELETS: 201 10*3/uL (ref 150–400)
Platelets: 171 10*3/uL (ref 150–400)
RBC: 3.86 MIL/uL — ABNORMAL LOW (ref 4.22–5.81)
RBC: 3.74 MIL/uL — ABNORMAL LOW (ref 4.22–5.81)
RDW: 13 % (ref 11.5–15.5)
RDW: 13 % (ref 11.5–15.5)
WBC: 6.7 10*3/uL (ref 4.0–10.5)
WBC: 8.1 10*3/uL (ref 4.0–10.5)

## 2014-09-24 LAB — COMPREHENSIVE METABOLIC PANEL
ALK PHOS: 43 U/L (ref 38–126)
ALT: 21 U/L (ref 17–63)
AST: 20 U/L (ref 15–41)
Albumin: 3.3 g/dL — ABNORMAL LOW (ref 3.5–5.0)
Anion gap: 7 (ref 5–15)
BILIRUBIN TOTAL: 0.5 mg/dL (ref 0.3–1.2)
BUN: 37 mg/dL — AB (ref 6–20)
CHLORIDE: 112 mmol/L — AB (ref 101–111)
CO2: 22 mmol/L (ref 22–32)
CREATININE: 2.04 mg/dL — AB (ref 0.61–1.24)
Calcium: 9.1 mg/dL (ref 8.9–10.3)
GFR calc Af Amer: 35 mL/min — ABNORMAL LOW (ref >60)
GFR, EST NON AFRICAN AMERICAN: 30 mL/min — AB (ref >60)
GLUCOSE: 112 mg/dL — AB (ref 65–99)
POTASSIUM: 4.4 mmol/L (ref 3.5–5.1)
Sodium: 141 mmol/L (ref 135–145)
Total Protein: 5.8 g/dL — ABNORMAL LOW (ref 6.5–8.1)

## 2014-09-24 LAB — PROTIME-INR
INR: 1.04 (ref 0.00–1.49)
Prothrombin Time: 13.7 seconds (ref 11.6–15.2)

## 2014-09-24 LAB — TYPE AND SCREEN
ABO/RH(D): A POS
Antibody Screen: NEGATIVE

## 2014-09-24 LAB — I-STAT TROPONIN, ED: Troponin i, poc: 0.01 ng/mL (ref 0.00–0.08)

## 2014-09-24 LAB — ABO/RH: ABO/RH(D): A POS

## 2014-09-24 LAB — MRSA PCR SCREENING: MRSA BY PCR: NEGATIVE

## 2014-09-24 MED ORDER — FLUTICASONE PROPIONATE 50 MCG/ACT NA SUSP
1.0000 | Freq: Every day | NASAL | Status: DC
  Administered 2014-09-25 – 2014-09-26 (×2): 1 via NASAL
  Filled 2014-09-24: qty 16

## 2014-09-24 MED ORDER — FOLIC ACID 5 MG/ML IJ SOLN
1.0000 mg | Freq: Every day | INTRAMUSCULAR | Status: DC
  Administered 2014-09-24 – 2014-09-25 (×2): 1 mg via INTRAVENOUS
  Filled 2014-09-24 (×3): qty 0.2

## 2014-09-24 MED ORDER — SODIUM CHLORIDE 0.9 % IV BOLUS (SEPSIS)
500.0000 mL | Freq: Once | INTRAVENOUS | Status: AC
Start: 2014-09-24 — End: 2014-09-24
  Administered 2014-09-24: 500 mL via INTRAVENOUS

## 2014-09-24 MED ORDER — SODIUM CHLORIDE 0.9 % IV SOLN
INTRAVENOUS | Status: DC
  Administered 2014-09-24 – 2014-09-26 (×3): via INTRAVENOUS

## 2014-09-24 MED ORDER — MORPHINE SULFATE 2 MG/ML IJ SOLN: 1.0000 mg | INTRAMUSCULAR | Status: DC | PRN

## 2014-09-24 MED ORDER — SODIUM CHLORIDE 0.9 % IV SOLN
Freq: Once | INTRAVENOUS | Status: AC
  Administered 2014-09-24: 17:00:00 via INTRAVENOUS

## 2014-09-24 MED ORDER — ONDANSETRON HCL 4 MG/2ML IJ SOLN: 4.0000 mg | Freq: Four times a day (QID) | INTRAMUSCULAR | Status: DC | PRN

## 2014-09-24 MED ORDER — IRBESARTAN 150 MG PO TABS
150.0000 mg | ORAL_TABLET | Freq: Every day | ORAL | Status: DC
  Administered 2014-09-25 – 2014-09-26 (×2): 150 mg via ORAL
  Filled 2014-09-24 (×2): qty 1

## 2014-09-24 MED ORDER — DILTIAZEM HCL 60 MG PO TABS
60.0000 mg | ORAL_TABLET | Freq: Four times a day (QID) | ORAL | Status: DC
  Administered 2014-09-25 (×3): 60 mg via ORAL
  Filled 2014-09-24 (×9): qty 1

## 2014-09-24 MED ORDER — METOPROLOL TARTRATE 50 MG PO TABS
50.0000 mg | ORAL_TABLET | Freq: Two times a day (BID) | ORAL | Status: DC
  Administered 2014-09-25 – 2014-09-26 (×2): 50 mg via ORAL
  Filled 2014-09-24 (×4): qty 1

## 2014-09-24 MED ORDER — SODIUM CHLORIDE 0.9 % IJ SOLN
3.0000 mL | Freq: Two times a day (BID) | INTRAMUSCULAR | Status: DC
  Administered 2014-09-25: 3 mL via INTRAVENOUS

## 2014-09-24 MED ORDER — LORATADINE 10 MG PO TABS
10.0000 mg | ORAL_TABLET | Freq: Every day | ORAL | Status: DC
  Administered 2014-09-25 – 2014-09-26 (×2): 10 mg via ORAL
  Filled 2014-09-24 (×2): qty 1

## 2014-09-24 MED ORDER — ALBUTEROL SULFATE (2.5 MG/3ML) 0.083% IN NEBU: 2.5000 mg | INHALATION_SOLUTION | RESPIRATORY_TRACT | Status: DC | PRN

## 2014-09-24 MED ORDER — AZELASTINE HCL 0.1 % NA SOLN
1.0000 | Freq: Every day | NASAL | Status: DC
  Administered 2014-09-25 – 2014-09-26 (×2): 1 via NASAL
  Filled 2014-09-24: qty 30

## 2014-09-24 MED ORDER — AZELASTINE-FLUTICASONE 137-50 MCG/ACT NA SUSP: 1.0000 | Freq: Every day | NASAL | Status: DC

## 2014-09-24 MED ORDER — ONDANSETRON HCL 4 MG PO TABS: 4.0000 mg | ORAL_TABLET | Freq: Four times a day (QID) | ORAL | Status: DC | PRN

## 2014-09-24 MED ORDER — PANTOPRAZOLE SODIUM 40 MG PO TBEC
40.0000 mg | DELAYED_RELEASE_TABLET | Freq: Every day | ORAL | Status: DC
  Administered 2014-09-24 – 2014-09-26 (×3): 40 mg via ORAL
  Filled 2014-09-24 (×3): qty 1

## 2014-09-24 MED ORDER — FLUTICASONE PROPIONATE HFA 44 MCG/ACT IN AERO
1.0000 | INHALATION_SPRAY | Freq: Two times a day (BID) | RESPIRATORY_TRACT | Status: DC
  Administered 2014-09-25 – 2014-09-26 (×3): 1 via RESPIRATORY_TRACT
  Filled 2014-09-24 (×2): qty 10.6

## 2014-09-24 NOTE — ED Provider Notes (Signed)
  Face-to-face evaluation   History: Onset rectal bleeding yesterday. No syncope, chest pain or ongoing weakness.   Physical exam: Alert, elderly male, no apparent distress. Abdomen normal bowel sounds, soft, nontender. Palpation. Mucous membranes are moist. There is no pallor.  Medical screening examination/treatment/procedure(s) were conducted as a shared visit with non-physician practitioner(s) and myself.  I personally evaluated the patient during the encounter  Mancel BaleElliott Averie Meiner, MD 09/26/14 [phone]

## 2014-09-24 NOTE — H&P (Signed)
[facility] History and Physical                                                                                    Jorge Cherry, is a 79 y.o. male  MRN: [id number]   DOB - 1936/03/09  Admit Date - 09/24/2014  Outpatient Primary MD for the patient is Hoyle Sauer, MD  Referring MD: Effie Shy / ER  Consulting MD: Madilyn Fireman / GI  With History of -  Past Medical History  Diagnosis Date  . Aortic valve disorder     2D ECHO, 10/19/2011 - EF >55%, LA mild-moderately dilated, mild-moderate tricuspid regurgitation, mild-moderate valvular aortic stenosis, moderate calcification of aortic valve leaflets  . DOE (dyspnea on exertion)     LEXISCAN, 11/05/2011 - Non-diagnositc for ischemia, post-stress EF 56%, small fixed apical inferior, apical lateral defect which may represent scar  . Atherosclerosis of renal artery     RENAL DOPPLER, 12/10/2011 - Left renal artery demonstrated narrowing with elevated velocities consistent with a 1-59% diameter reduction      Past Surgical History  Procedure Laterality Date  . Cardiac catheterization  02/27/2004    Coronary intervention and medical management  . Cardiac catheterization  03/04/2004    SVG supplying the diagonal vessel stented with a 3.5x35mm Taxus stent post dilated to 4.0 mm    in for   Chief Complaint  Patient presents with  . GI Bleeding     HPI This is a 79 year old male patient with past medical history of known coronary artery disease status post CABG revascularization 21 years ago and drug-eluting stent to graft supplying diagonal 12 years ago with preserved LV function and mild aortic valve stenosis. He is followed by Dr. Tresa Endo. He also has a history of dyslipidemia and allergy symptoms. He also has a remote history of diverticular bleed previously evaluated by Dr. Madilyn Fireman. Patient was sent over from [facility] today after presenting to the office with acute onset of bloody diarrhea yesterday evening on 5/15.  Patient reports that while at home he had 4 bloody stools (since last night) and his last bloody stool was around 12:30 PM while at the physician's office. He also endorsed a sensation of feeling weak and dizzy but was without any reports of chest pain. While in the ER waiting room he developed "cold sweats" but did not have any nausea or vomiting. He denied abdominal pain. He reports because of the bleeding he did not take his Effient or aspirin last night.  In the ER he was afebrile, his blood pressure was somewhat soft at 110/68 sitting upright with a pulse of 55, respirations 14 and room air saturations 100 percent. Patient was orthostatic with supine blood pressure 147/64 with a standing blood pressure of 128/76. Please note that patient is on chronic beta blockers as well as calcium channel blockers and pulse rate was unable to compensate for the orthostasis: supine pulse rate 53 and standing pulse rate 60. Patient's laboratory data was unremarkable except for mild acute renal failure with a BUN of 37 and creatinine of 2.04 noting baseline BUN/creatinine 28 and 1.62. Hemoglobin had also decreased slightly to 11.7 from a  baseline of 13.4. Stool for blood has not been checked but patient did have witnessed hematochezia at primary care physician's office. Troponin was 0.01. Coags are normal. Type and screen has been obtained. EKG revealed sinus bradycardia with a right bundle branch block and normal QTC and no ischemic changes.   Review of Systems   In addition to the HPI above,  No Fever-chills, myalgias or other constitutional symptoms No Headache, changes with Vision or hearing, new weakness, tingling, numbness in any extremity, No problems swallowing food or Liquids, indigestion/reflux No Chest pain, Cough or Shortness of Breath, palpitations, orthopnea or DOE No Abdominal pain, N/V No dysuria, hematuria or flank pain No new skin rashes, lesions, masses or bruises, No new joints pains-aches No  recent weight gain or loss No polyuria, polydypsia or polyphagia,  *A full 10 point Review of Systems was done, except as stated above, all other Review of Systems were negative.  Social History History  Substance Use Topics  . Smoking status: Never Smoker   . Smokeless tobacco: Not on file  . Alcohol Use: No    Resides at: Private residence  Lives with: Wife  Ambulatory status: Without assistive devices prior to admission   Family History Family History  Problem Relation Age of Onset  . Cancer Mother   . Heart disease Father   . Stroke Maternal Grandmother   . Cancer Maternal Grandfather     Prior to Admission medications   Medication Sig Start Date End Date Taking? Authorizing Provider  aspirin 81 MG tablet Take 81 mg by mouth daily.   Yes Historical Provider, MD  atorvastatin (LIPITOR) 40 MG tablet Take 20 mg by mouth daily.   Yes Historical Provider, MD  Cholecalciferol (VITAMIN D-3 PO) Take 750 Units by mouth daily.   Yes Historical Provider, MD  diltiazem (CARDIZEM CD) 240 MG 24 hr capsule TAKE 1 CAPSULE (240 MG TOTAL) BY MOUTH DAILY. 07/30/14  Yes Lennette Biharihomas A Kelly, MD  DYMISTA 137-50 MCG/ACT SUSP Place 1 spray into both nostrils daily. 03/20/14  Yes Historical Provider, MD  ezetimibe (ZETIA) 10 MG tablet Take 1 tablet (10 mg total) by mouth daily. 04/12/14  Yes Lennette Biharihomas A Kelly, MD  fenofibrate (TRICOR) 145 MG tablet Take 72.5 mg by mouth daily.    Yes Historical Provider, MD  fish oil-omega-3 fatty acids 1000 MG capsule Take 2 capsules (2 g total) by mouth 2 (two) times daily. 10/25/12  Yes Lennette Biharihomas A Kelly, MD  Garlic OIL Take 1 capsule by mouth daily.   Yes Historical Provider, MD  loratadine (CLARITIN) 10 MG tablet Take 10 mg by mouth daily.   Yes Historical Provider, MD  metoprolol succinate (TOPROL-XL) 100 MG 24 hr tablet Take 1 tablet (100 mg total) by mouth daily. Take with or immediately following a meal. 04/12/14  Yes Lennette Biharihomas A Kelly, MD  pantoprazole (PROTONIX) 40 MG  tablet Take 40 mg by mouth daily.    Yes Historical Provider, MD  prasugrel (EFFIENT) 10 MG TABS tablet Take 1 tablet (10 mg total) by mouth daily. 07/30/14  Yes Lennette Biharihomas A Kelly, MD  PROAIR HFA 108 [id number](90 BASE) MCG/ACT inhaler Inhale 1 puff into the lungs as needed for wheezing or shortness of breath.  09/04/12  Yes Historical Provider, MD  QVAR 80 MCG/ACT inhaler Inhale 2 puffs into the lungs daily. 03/05/14  Yes Historical Provider, MD  spironolactone (ALDACTONE) 25 MG tablet TAKE 0.5 TABLETS (12.5 MG TOTAL) BY MOUTH DAILY. 01/08/14  Yes Lennette Biharihomas A Kelly, MD  telmisartan (MICARDIS) 80 MG tablet Take 80 mg by mouth daily.   Yes Historical Provider, MD  torsemide (DEMADEX) 20 MG tablet Take 1 tablet (20 mg total) by mouth 2 (two) times daily. 04/12/14  Yes Lennette Biharihomas A Kelly, MD  vitamin C (ASCORBIC ACID) 500 MG tablet Take 500 mg by mouth daily.   Yes Historical Provider, MD  vitamin E 400 UNIT capsule Take 400 Units by mouth daily.   Yes Historical Provider, MD  Zinc 50 MG CAPS Take 1 capsule by mouth 2 (two) times a week.   Yes Historical Provider, MD  diltiazem (DILACOR XR) 240 MG 24 hr capsule Take 1 capsule (240 mg total) by mouth at bedtime. Patient not taking: Reported on 09/24/2014 04/12/14   Lennette Biharihomas A Kelly, MD    Allergies  Allergen Reactions  . Contrast Media [Iodinated Diagnostic Agents]     Physical Exam  Vitals  Blood pressure 132/71, pulse 53, temperature 97.6 F (36.4 C), temperature source Oral, resp. rate 19, SpO2 97 %.   General:  In no acute distress, appears healthy and well nourished  Psych:  Normal affect, Denies Suicidal or Homicidal ideations, Awake Alert, Oriented X 3. Speech and thought patterns are clear and appropriate, no apparent short term memory deficits  Neuro:   No focal neurological deficits, CN II through XII intact, Strength 5/5 all 4 extremities, Sensation intact all 4 extremities.  ENT:  Ears and Eyes appear Normal, Conjunctivae clear, PER. Moist oral mucosa  without erythema or exudates.  Neck:  Supple, No lymphadenopathy appreciated  Respiratory:  Symmetrical chest wall movement, Good air movement bilaterally, CTAB. Room Air  Cardiac:  RRR, pansystolic murmur left sternal border 3/6 second intercostal space, no LE edema noted, no JVD, No carotid bruits, peripheral pulses palpable at 2+  Abdomen:  Positive bowel sounds, Soft, Non tender, Non distended,  No masses appreciated, no obvious hepatosplenomegaly  Skin:  No Cyanosis, Normal Skin Turgor, No Skin Rash or Bruise.  Extremities: Symmetrical without obvious trauma or injury,  no effusions.  Data Review  CBC  Recent Labs Lab 09/24/14 1414  WBC 6.7  HGB 11.7*  HCT 35.8*  PLT 201  MCV 92.7  MCH 30.3  MCHC 32.7  RDW 13.0    Chemistries   Recent Labs Lab 09/24/14 1414  NA 141  K 4.4  CL 112*  CO2 22  GLUCOSE 112*  BUN 37*  CREATININE 2.04*  CALCIUM 9.1  AST 20  ALT 21  ALKPHOS 43  BILITOT 0.5    CrCl cannot be calculated (Unknown ideal weight.).  No results for input(s): TSH, T4TOTAL, T3FREE, THYROIDAB in the last 72 hours.  Invalid input(s): FREET3  Coagulation profile  Recent Labs Lab 09/24/14 1414  INR 1.04    No results for input(s): DDIMER in the last 72 hours.  Cardiac Enzymes No results for input(s): CKMB, TROPONINI, MYOGLOBIN in the last 168 hours.  Invalid input(s): CK  Invalid input(s): POCBNP  Urinalysis No results found for: COLORURINE, APPEARANCEUR, LABSPEC, PHURINE, GLUCOSEU, HGBUR, BILIRUBINUR, KETONESUR, PROTEINUR, UROBILINOGEN, NITRITE, LEUKOCYTESUR  Imaging results:   No results found.   EKG: (Independently reviewed) sinus rhythm, right bundle branch block, no T-segment/T-wave changes concerning for ischemia   Assessment & Plan  Principal Problem:   Lower GI bleed/Diverticulosis of colon with hemorrhage -Admit to stepdown -Appreciate gastroenterology assistance -Suspect underlying diverticular bleed which hopefully  will be self-limiting -Allow clear liquid diet but nothing by mouth after midnight -Dr. Madilyn FiremanHayes to see patient in a.m.-please  call sooner if develops more brisk or more symptomatic bleeding -Holding antiplatelet agents (see below)  Active Problems:   Coronary atherosclerosis of native coronary artery -CABG revascularization 21 years ago and drug-eluting stent to graft 12 years ago -Last outpatient cardiac evaluation 04/12/14 and has been stable on Effient -Recommend call cardiology in a.m. to inform them that at least for the short-term Effient and aspirin on hold -Currently no chest pain or shortness of breath in setting of symptomatic anemia and ongoing GI bleed    Symptomatic anemia -Hemoglobin has drifted downward and patient did develop some orthostasis -We will hold diuretics and decreased calcium channel blocker and beta blocker by 50% and utilize shorter acting formulations with next dose is due tomorrow a.m. at 8 -Check CBC now since last known leading episode at 12:30 PM; this was just 2 hours prior to ER CBC-we'll cycle CBC every 12 hours -Recommend only transfuse if hemoglobin less than 8 or if patient develops further symptoms such as hypertension, chest pain or shortness of breath -Mobilize with assistance since reported dizziness with standing    Acute renal failure -Mild and likely related to relative hypotension for this patient and recent use of diuretics -See above    Hyperlipidemia with target LDL less than 70 -Hold home medications while on clear liquids    Benign hypertensive heart disease without heart failure -Continue usual home medications except with adjustments as noted above    Aortic valve stenosis -Patient became dizzy with standing but only has mild aortic valve stenosis so doubt this is contributing to patient's dizziness -Follow closely pressure if continues to bleed or if hemoglobin continues to drop    DVT Prophylaxis: SCDs  Family Communication:    Wife at bedside  Code Status:  Full code  Condition:  Stable  Discharge disposition: Anticipate will discharge back to home with wife pending GI evaluation and resolution of lower GI bleeding symptoms  Time spent in minutes : 60      ELLIS,ALLISON L. ANP on 09/24/2014 at 5:06 PM  Between 7am to 7pm - Pager - [phone]  After 7pm go to [email or web address] - password [email or web address]  And look for the night coverage person covering me after hours  [facility]

## 2014-09-24 NOTE — ED Notes (Signed)
Pt sent from [facility] for further evaluation of GI bleeding. He reports 4-5 episodes bloody bowel movements since last night. [facility] and his hemoccult was positive. Denies n/v. States he has felt some dizziness.  He is a&Ox4, resp e/.

## 2014-09-24 NOTE — ED Provider Notes (Signed)
CSN: [id number]     Arrival date & time 09/24/14  1324 History   First MD Initiated Contact with Patient 09/24/14 1510     Chief Complaint  Patient presents with  . GI Bleeding     (Consider location/radiation/quality/duration/timing/severity/associated sxs/prior Treatment) HPI Jorge Cherry is a 79 y.o. male with history of coronary disease, presents to emergency department complaining of rectal bleeding. Patient states since last night he has had 4 bowel movements that he states were next with bright red blood. He states the first one was solid with blood mixed in it, the last 3 were loose, with some clots and some darker blood. Patient denies any nausea or vomiting. He denies any abdominal pain. He states this afternoon he also got lightheaded, diaphoretic, dizzy, states he thought he was going to pass out. His wife took him to his primary care doctor Dr. Felipa EthAvva, who sent him here. In the office his hemoglobin was 11. Patient denies any other complaints. He denies chest pain or shortness of breath. No fever or chills. States he had gastrointestinal bleeding multiple years ago and had a colonoscopy which ended up being normal. Patient states he thinks he might have some diverticulosis.   Past Medical History  Diagnosis Date  . Aortic valve disorder     2D ECHO, 10/19/2011 - EF >55%, LA mild-moderately dilated, mild-moderate tricuspid regurgitation, mild-moderate valvular aortic stenosis, moderate calcification of aortic valve leaflets  . DOE (dyspnea on exertion)     LEXISCAN, 11/05/2011 - Non-diagnositc for ischemia, post-stress EF 56%, small fixed apical inferior, apical lateral defect which may represent scar  . Atherosclerosis of renal artery     RENAL DOPPLER, 12/10/2011 - Left renal artery demonstrated narrowing with elevated velocities consistent with a 1-59% diameter reduction   Past Surgical History  Procedure Laterality Date  . Cardiac catheterization  02/27/2004    Coronary  intervention and medical management  . Cardiac catheterization  03/04/2004    SVG supplying the diagonal vessel stented with a [id number] Taxus stent post dilated to 4.0 mm   Family History  Problem Relation Age of Onset  . Cancer Mother   . Heart disease Father   . Stroke Maternal Grandmother   . Cancer Maternal Grandfather    History  Substance Use Topics  . Smoking status: Never Smoker   . Smokeless tobacco: Not on file  . Alcohol Use: No    Review of Systems  Constitutional: Negative for fever and chills.  Respiratory: Negative for cough, chest tightness and shortness of breath.   Cardiovascular: Negative for chest pain, palpitations and leg swelling.  Gastrointestinal: Positive for blood in stool. Negative for nausea, vomiting, abdominal pain, diarrhea and abdominal distention.  Musculoskeletal: Negative for myalgias, arthralgias, neck pain and neck stiffness.  Skin: Negative for rash.  Allergic/Immunologic: Negative for immunocompromised state.  Neurological: Negative for dizziness, weakness, light-headedness, numbness and headaches.  All other systems reviewed and are negative.     Allergies  Contrast media  Home Medications   Prior to Admission medications   Medication Sig Start Date End Date Taking? Authorizing Provider  aspirin 81 MG tablet Take 81 mg by mouth daily.    Historical Provider, MD  atorvastatin (LIPITOR) 40 MG tablet Take 20 mg by mouth daily.    Historical Provider, MD  Cholecalciferol (VITAMIN D-3 PO) Take 750 Units by mouth daily.    Historical Provider, MD  diltiazem (CARDIZEM CD) 240 MG 24 hr capsule TAKE 1 CAPSULE (240 MG TOTAL) BY  MOUTH DAILY. 07/30/14   Lennette Biharihomas A Kelly, MD  diltiazem (DILACOR XR) 240 MG 24 hr capsule Take 1 capsule (240 mg total) by mouth at bedtime. 04/12/14   Lennette Biharihomas A Kelly, MD  DYMISTA 137-50 MCG/ACT SUSP Place 1 spray into both nostrils daily. 03/20/14   Historical Provider, MD  ezetimibe (ZETIA) 10 MG tablet Take 1 tablet  (10 mg total) by mouth daily. 04/12/14   Lennette Biharihomas A Kelly, MD  fenofibrate (TRICOR) 145 MG tablet Take 145 mg by mouth. 1/2 tablet daily    Historical Provider, MD  fish oil-omega-3 fatty acids 1000 MG capsule Take 2 capsules (2 g total) by mouth 2 (two) times daily. 10/25/12   Lennette Biharihomas A Kelly, MD  FLUZONE HIGH-DOSE 0.5 ML SUSY  04/11/14   Historical Provider, MD  loratadine (CLARITIN) 10 MG tablet Take 10 mg by mouth daily.    Historical Provider, MD  metoprolol succinate (TOPROL-XL) 100 MG 24 hr tablet Take 1 tablet (100 mg total) by mouth daily. Take with or immediately following a meal. 04/12/14   Lennette Biharihomas A Kelly, MD  pantoprazole (PROTONIX) 40 MG tablet Take 40 mg by mouth 2 (two) times daily.    Historical Provider, MD  prasugrel (EFFIENT) 10 MG TABS tablet Take 1 tablet (10 mg total) by mouth daily. 07/30/14   Lennette Biharihomas A Kelly, MD  PROAIR HFA 108 (90 BASE) MCG/ACT inhaler Inhale 1 puff into the lungs as needed. 09/04/12   Historical Provider, MD  QVAR 80 MCG/ACT inhaler Inhale 2 puffs into the lungs daily. 03/05/14   Historical Provider, MD  spironolactone (ALDACTONE) 25 MG tablet TAKE 0.5 TABLETS (12.5 MG TOTAL) BY MOUTH DAILY. 01/08/14   Lennette Biharihomas A Kelly, MD  telmisartan (MICARDIS) 80 MG tablet Take 80 mg by mouth daily.    Historical Provider, MD  torsemide (DEMADEX) 20 MG tablet Take 1 tablet (20 mg total) by mouth 2 (two) times daily. 04/12/14   Lennette Biharihomas A Kelly, MD  vitamin C (ASCORBIC ACID) 500 MG tablet Take 500 mg by mouth daily.    Historical Provider, MD  vitamin E 400 UNIT capsule Take 400 Units by mouth daily.    Historical Provider, MD  Zinc 50 MG CAPS Take 1 capsule by mouth 2 (two) times a week.    Historical Provider, MD   BP 107/64 mmHg  Pulse 55  Temp(Src) 97.6 F (36.4 C) (Oral)  Resp 13  SpO2 96% Physical Exam  Constitutional: He is oriented to person, place, and time. He appears well-developed and well-nourished. No distress.  HENT:  Head: Normocephalic and atraumatic.  Eyes:  Conjunctivae are normal.  Neck: Neck supple.  Cardiovascular: Normal rate, regular rhythm and normal heart sounds.   Pulmonary/Chest: Effort normal. No respiratory distress. He has no wheezes. He has no rales.  Abdominal: Soft. Bowel sounds are normal. He exhibits no distension. There is tenderness. There is no rebound and no guarding.  Mild LLQ tenderness.   Musculoskeletal: He exhibits no edema.  Neurological: He is alert and oriented to person, place, and time.  Skin: Skin is warm and dry.  Nursing note and vitals reviewed.   ED Course  Procedures (including critical care time) Labs Review Labs Reviewed  CBC - Abnormal; Notable for the following:    RBC 3.86 (*)    Hemoglobin 11.7 (*)    HCT 35.8 (*)    All other components within normal limits  PROTIME-INR  COMPREHENSIVE METABOLIC PANEL  POC OCCULT BLOOD, ED  TYPE AND SCREEN  Imaging Review No results found.   EKG Interpretation   Date/Time:  Monday Sep 24 2014 14:57:13 EDT Ventricular Rate:  47 PR Interval:  212 QRS Duration: 128 QT Interval:  487 QTC Calculation: 431 R Axis:   -20 Text Interpretation:  Sinus bradycardia Right bundle branch block Since  last tracing Right bundle branch block is new Confirmed by Effie Shy  MD,  ELLIOTT ([id number]) on 09/24/2014 3:13:35 PM      MDM   Final diagnoses:  None    Patient with bright red blood per rectum. Abdomen is benign, no pain. Only mild tenderness. Discussed with Dr. Effie Shy who has seen patient as well, agrees most likely diverticular bleed. Patient is currently hemodynamically stable. His hemoglobin is 11.7. No imaging advised.   4:13 PM Discussed with Dr. Madilyn Fireman with gastroenterology, he will consult in the morning. Asked to be called if any changes in his condition. Will call hospitalist for admission.  Filed Vitals:   09/24/14 1831 09/24/14 1832 09/24/14 1925 09/25/14 0035  BP:  169/67 136/72 142/76  Pulse:  53 55 55  Temp:  97.5 F (36.4 C) 97.6 F (36.4  C) 97.9 F (36.6 C)  TempSrc:  Oral Oral Oral  Resp:  Height:  (1.778 m)     Weight: 201 lb 15.1 oz (91.6 kg)     SpO2:  97% 97% 97%         Jaynie Crumble, PA-C 09/25/14 0140  Mancel Bale, MD 09/26/14 [phone]

## 2014-09-24 NOTE — ED Notes (Signed)
Report attempted 3S

## 2014-09-25 DIAGNOSIS — I2510 Atherosclerotic heart disease of native coronary artery without angina pectoris: Secondary | ICD-10-CM

## 2014-09-25 DIAGNOSIS — I350 Nonrheumatic aortic (valve) stenosis: Secondary | ICD-10-CM

## 2014-09-25 DIAGNOSIS — N179 Acute kidney failure, unspecified: Secondary | ICD-10-CM

## 2014-09-25 DIAGNOSIS — E785 Hyperlipidemia, unspecified: Secondary | ICD-10-CM

## 2014-09-25 LAB — BASIC METABOLIC PANEL
Anion gap: 7 (ref 5–15)
BUN: 32 mg/dL — AB (ref 6–20)
CO2: 21 mmol/L — AB (ref 22–32)
Calcium: 8.4 mg/dL — ABNORMAL LOW (ref 8.9–10.3)
Chloride: 112 mmol/L — ABNORMAL HIGH (ref 101–111)
Creatinine, Ser: 1.59 mg/dL — ABNORMAL HIGH (ref 0.61–1.24)
GFR calc Af Amer: 47 mL/min — ABNORMAL LOW (ref >60)
GFR calc non Af Amer: 41 mL/min — ABNORMAL LOW (ref >60)
Glucose, Bld: 92 mg/dL (ref 65–99)
POTASSIUM: 4.3 mmol/L (ref 3.5–5.1)
Sodium: 140 mmol/L (ref 135–145)

## 2014-09-25 LAB — CBC
HCT: 31.1 % — ABNORMAL LOW (ref 39.0–52.0)
HCT: 32.2 % — ABNORMAL LOW (ref 39.0–52.0)
Hemoglobin: 10.1 g/dL — ABNORMAL LOW (ref 13.0–17.0)
Hemoglobin: 10.4 g/dL — ABNORMAL LOW (ref 13.0–17.0)
MCH: 30.2 pg (ref 26.0–34.0)
MCH: 30 pg (ref 26.0–34.0)
MCHC: 32.5 g/dL (ref 30.0–36.0)
MCHC: 32.3 g/dL (ref 30.0–36.0)
MCV: 93.1 fL (ref 78.0–100.0)
MCV: 92.8 fL (ref 78.0–100.0)
PLATELETS: 157 10*3/uL (ref 150–400)
Platelets: 152 10*3/uL (ref 150–400)
RBC: 3.34 MIL/uL — ABNORMAL LOW (ref 4.22–5.81)
RBC: 3.47 MIL/uL — AB (ref 4.22–5.81)
RDW: 13.1 % (ref 11.5–15.5)
RDW: 13.1 % (ref 11.5–15.5)
WBC: 6.2 10*3/uL (ref 4.0–10.5)
WBC: 5.3 10*3/uL (ref 4.0–10.5)

## 2014-09-25 NOTE — Progress Notes (Signed)
Nutrition Brief Note  Received call from patient's RN that patient had some questions about the diet he should follow for his diverticular bleed. Visited with patient and provided low and high fiber handouts from the [employer]. Encouraged patient to limit fiber intake while having signs and symptoms of GI distress, bleeding, etc. Reviewed how to gradually increase fiber intake in ~1-2 weeks. Patient appreciative of visit and handouts. No further nutrition intervention indicated at this time.  Joaquin CourtsKimberly Harris, RD, LDN, CNSC Pager [phone] After Hours Pager [phone]

## 2014-09-25 NOTE — Progress Notes (Signed)
[facility] TEAM 1 - Stepdown/ICU TEAM Progress Note  Jorge BodeGene S Pablo Cherry:[id number] DOB: 01/29/1936 DOA: 09/24/2014 PCP: Hoyle SauerAVVA,RAVISANKAR R, MD  Admit HPI / Brief Narrative: 79 year old WM PMHx CAD S/P CABG revascularization 21 years ago and drug-eluting stent to graft supplying diagonal 12 years ago with preserved LV function and mild aortic valve stenosis. Followed by (Dr. Tresa EndoKelly), Dyslipidemia, Diverticular bleed previously evaluated by Dr. Madilyn FiremanHayes.  [facility] after presenting to the office with acute onset of bloody diarrhea yesterday evening on 5/15. Patient reports that while at home he had 4 bloody stools (since last night) and his last bloody stool was around 12:30 PM while at the physician's office. He also endorsed a sensation of feeling weak and dizzy but was without any reports of chest pain. While in the ER waiting room he developed "cold sweats" but did not have any nausea or vomiting. He denied abdominal pain. He reports because of the bleeding he did not take his Effient or aspirin last night.  In the ER he was afebrile, his blood pressure was somewhat soft at 110/68 sitting upright with a pulse of 55, respirations 14 and room air saturations 100 percent. Patient was orthostatic. Hemoglobin had also decreased slightly to 11.7 from a baseline of 13.4. Stool for blood has not been checked but patient did have witnessed hematochezia at primary care physician's office.    HPI/Subjective: 5/17 A/O 4, NAD, negative CP, negative SOB, negative N/V, negative abdominal pain. States had one melanic stool today.  Assessment/Plan: Lower GI bleed/Diverticulosis of colon with hemorrhage -GI Suspects underlying diverticular bleed which which has stopped. No colonoscopy indicated at this time. -Diet advanced and if patient tolerates overnight without further bleeding DC in a.m.  -Follow-up with Dr. Madilyn FiremanHayes 3 weeks  Coronary atherosclerosis of native  coronary artery -CABG revascularization 21 years ago and drug-eluting stent to graft 12 years ago -Last outpatient cardiac evaluation 04/12/14 and has been stable on Effient -Would hold all anticoagulants/antiplatelets for minimum of 2 weeks. Should be no problem as all cardiac procedures performed decades ago. -Will have patient follow-up with cardiologist at discharge, to discuss optimal time for restarting medication  Symptomatic anemia -Stable, will recheck H&H in a.m. -Orthostatic vitals in the a.m. -If all studies normal discharge in a.m.  Acute renal failure -Improving, evaluate in a.m.  -See above  Hyperlipidemia with target LDL less than 70 -Hold home medications while on clear liquids -Restart home medications in the a.m. if negative acute bleed overnight.  Benign hypertensive heart disease without heart failure -Continue usual home medications except with adjustments as noted above  Aortic valve stenosis -Per patient and wife cardiologist will aware of condition of aortic valve. Patient states has been informed valve will need to be replaced in the near future.     Code Status: FULL Family Communication: Wife present at time of exam Disposition Plan: DC in a.m.    Consultants: Dr.Vincent Bosie ClosSchooler GI  Procedure/Significant Events: NA   Culture NA  Antibiotics: NA  DVT prophylaxis: SCD   Devices    LINES / TUBES:      Continuous Infusions: . sodium chloride 100 mL/hr at 09/25/14 1817    Objective: VITAL SIGNS: Temp: 98.2 F (36.8 C) (05/17 2300) Temp Source: Oral (05/17 2300) BP: 149/65 mmHg (05/17 2304) Pulse Rate: 46 (05/17 2304) SPO2; FIO2:   Intake/Output Summary (Last 24 hours) at 09/25/14 2344 Last data filed at 09/25/14 2305  Gross per 24 hour  Intake   2020 ml  Output   1150 ml  Net    870 ml     Exam: General: A/O 4, NAD, No acute respiratory distress Lungs: Clear to auscultation bilaterally without wheezes or  crackles Cardiovascular: Regular rate and rhythm, plus holosystolic murmur (chronic), negative gallop or rub normal S1 and S2 Abdomen: Nontender, nondistended, soft, bowel sounds positive, no rebound, no ascites, no appreciable mass Extremities: No significant cyanosis, clubbing, or edema bilateral lower extremities  Data Reviewed: Basic Metabolic Panel:  Recent Labs Lab 09/24/14 1414 09/25/14 0515  NA 141 140  K 4.4 4.3  CL 112* 112*  CO2 22 21*  GLUCOSE 112* 92  BUN 37* 32*  CREATININE 2.04* 1.59*  CALCIUM 9.1 8.4*   Liver Function Tests:  Recent Labs Lab 09/24/14 1414  AST 20  ALT 21  ALKPHOS 43  BILITOT 0.5  PROT 5.8*  ALBUMIN 3.3*   No results for input(s): LIPASE, AMYLASE in the last 168 hours. No results for input(s): AMMONIA in the last 168 hours. CBC:  Recent Labs Lab 09/24/14 1414 09/24/14 1913 09/25/14 0515 09/25/14 1712  WBC 6.7 8.1 6.2 5.3  HGB 11.7* 11.2* 10.1* 10.4*  HCT 35.8* 34.8* 31.1* 32.2*  MCV 92.7 93.0 93.1 92.8  PLT 201 171 152 157   Cardiac Enzymes: No results for input(s): CKTOTAL, CKMB, CKMBINDEX, TROPONINI in the last 168 hours. BNP (last 3 results) No results for input(s): BNP in the last 8760 hours.  ProBNP (last 3 results) No results for input(s): PROBNP in the last 8760 hours.  CBG: No results for input(s): GLUCAP in the last 168 hours.  Recent Results (from the past 240 hour(s))  MRSA PCR Screening     Status: None   Collection Time: 09/24/14  6:30 PM  Result Value Ref Range Status   MRSA by PCR NEGATIVE NEGATIVE Final    Comment:        The GeneXpert MRSA Assay (FDA approved for NASAL specimens only), is one component of a comprehensive MRSA colonization surveillance program. It is not intended to diagnose MRSA infection nor to guide or monitor treatment for MRSA infections.      Studies:  Recent x-ray studies have been reviewed in detail by the Attending Physician  Scheduled Meds:  Scheduled Meds: .  azelastine  1 spray Each Nare Daily  . diltiazem  60 mg Oral 4 times per day  . fluticasone  1 spray Each Nare Daily  . fluticasone  1 puff Inhalation BID  . folic acid  1 mg Intravenous Daily  . irbesartan  150 mg Oral Daily  . loratadine  10 mg Oral Daily  . metoprolol tartrate  50 mg Oral BID  . pantoprazole  40 mg Oral Daily  . sodium chloride  3 mL Intravenous Q12H    Time spent on care of this patient: 40 mins   Jorge Cherry, Roselind MessierURTIS J , MD  [facility] Office  [phone] Pager [phone]  On-Call/Text Page:      [email or web address]      password [email or web address]  If 7PM-7AM, please contact night-coverage [email or web address] Password [email or web address] 09/25/2014, 11:44 PM   LOS: 1 day   Care during the described time interval was provided by me .  I have reviewed this patient's available data, including medical history, events of note, physical examination, radiology studies and test results as part of my evaluation  Carolyne Littlesurtis Megumi Treaster, MD [phone] Pager

## 2014-09-25 NOTE — Consult Note (Signed)
Referring Provider: Dr. Mancel BaleElliott Wentz Primary Care Physician:  Hoyle SauerAVVA,RAVISANKAR R, MD Primary Gastroenterologist:  Dr. Madilyn FiremanHayes  Reason for Consultation:  GI bleed  HPI: Jorge Cherry is a 79 y.o. male with a history of diverticular bleeding who last had a colonoscopy in 12/2011 and had a large adenomatous polyp removed in 07/2011 and also found to have diffuse left-sided diverticulosis was admitted for the acute onset of rectal bleeding that started this past Sunday night. He had several episodes of red blood per rectum since Sunday night and one episode of dark red and black colored stool yesterday. His last bloody episode was yesterday afternoon at his doctor's office. He felt clammy and dizzy yesterday as well. Reports about 5 total episodes of bloody stools and denies any rectal bleeding today or last night. Denies associated abdominal pain. Denies N/V/fevers/chills/shortness of breath/chest pain. Reports briefly feeling like he was going to pass out in the ER. He was on Effient and Aspirin at admit on 09/24/14 (did not take it the night before admit). Hgb 11.7 yesterday and it was 13.4 in 03/2014. Jorge Cherry at bedside.     Past Medical History  Diagnosis Date  . Aortic valve disorder     2D ECHO, 10/19/2011 - EF >55%, LA mild-moderately dilated, mild-moderate tricuspid regurgitation, mild-moderate valvular aortic stenosis, moderate calcification of aortic valve leaflets  . DOE (dyspnea on exertion)     LEXISCAN, 11/05/2011 - Non-diagnositc for ischemia, post-stress EF 56%, small fixed apical inferior, apical lateral defect which may represent scar  . Atherosclerosis of renal artery     RENAL DOPPLER, 12/10/2011 - Left renal artery demonstrated narrowing with elevated velocities consistent with a 1-59% diameter reduction    Past Surgical History  Procedure Laterality Date  . Cardiac catheterization  02/27/2004    Coronary intervention and medical management  . Cardiac catheterization  03/04/2004   SVG supplying the diagonal vessel stented with a [id number] Taxus stent post dilated to 4.0 mm    Prior to Admission medications   Medication Sig Start Date End Date Taking? Authorizing Provider  aspirin 81 MG tablet Take 81 mg by mouth daily.   Yes Historical Provider, MD  atorvastatin (LIPITOR) 40 MG tablet Take 20 mg by mouth daily.   Yes Historical Provider, MD  Cholecalciferol (VITAMIN D-3 PO) Take 750 Units by mouth daily.   Yes Historical Provider, MD  diltiazem (CARDIZEM CD) 240 MG 24 hr capsule TAKE 1 CAPSULE (240 MG TOTAL) BY MOUTH DAILY. 07/30/14  Yes Lennette Biharihomas A Kelly, MD  DYMISTA 137-50 MCG/ACT SUSP Place 1 spray into both nostrils daily. 03/20/14  Yes Historical Provider, MD  ezetimibe (ZETIA) 10 MG tablet Take 1 tablet (10 mg total) by mouth daily. 04/12/14  Yes Lennette Biharihomas A Kelly, MD  fenofibrate (TRICOR) 145 MG tablet Take 72.5 mg by mouth daily.    Yes Historical Provider, MD  fish oil-omega-3 fatty acids 1000 MG capsule Take 2 capsules (2 g total) by mouth 2 (two) times daily. 10/25/12  Yes Lennette Biharihomas A Kelly, MD  Garlic OIL Take 1 capsule by mouth daily.   Yes Historical Provider, MD  loratadine (CLARITIN) 10 MG tablet Take 10 mg by mouth daily.   Yes Historical Provider, MD  metoprolol succinate (TOPROL-XL) 100 MG 24 hr tablet Take 1 tablet (100 mg total) by mouth daily. Take with or immediately following a meal. 04/12/14  Yes Lennette Biharihomas A Kelly, MD  pantoprazole (PROTONIX) 40 MG tablet Take 40 mg by mouth daily.    Yes Historical  Provider, MD  prasugrel (EFFIENT) 10 MG TABS tablet Take 1 tablet (10 mg total) by mouth daily. 07/30/14  Yes Lennette Biharihomas A Kelly, MD  PROAIR HFA 108 [phone](90 BASE) MCG/ACT inhaler Inhale 1 puff into the lungs as needed for wheezing or shortness of breath.  09/04/12  Yes Historical Provider, MD  QVAR 80 MCG/ACT inhaler Inhale 2 puffs into the lungs daily. 03/05/14  Yes Historical Provider, MD  spironolactone (ALDACTONE) 25 MG tablet TAKE 0.5 TABLETS (12.5 MG TOTAL) BY MOUTH DAILY.  01/08/14  Yes Lennette Biharihomas A Kelly, MD  telmisartan (MICARDIS) 80 MG tablet Take 80 mg by mouth daily.   Yes Historical Provider, MD  torsemide (DEMADEX) 20 MG tablet Take 1 tablet (20 mg total) by mouth 2 (two) times daily. 04/12/14  Yes Lennette Biharihomas A Kelly, MD  vitamin C (ASCORBIC ACID) 500 MG tablet Take 500 mg by mouth daily.   Yes Historical Provider, MD  vitamin E 400 UNIT capsule Take 400 Units by mouth daily.   Yes Historical Provider, MD  Zinc 50 MG CAPS Take 1 capsule by mouth 2 (two) times a week.   Yes Historical Provider, MD  diltiazem (DILACOR XR) 240 MG 24 hr capsule Take 1 capsule (240 mg total) by mouth at bedtime. Patient not taking: Reported on 09/24/2014 04/12/14   Lennette Biharihomas A Kelly, MD    Scheduled Meds: . azelastine  1 spray Each Nare Daily  . diltiazem  60 mg Oral 4 times per day  . fluticasone  1 spray Each Nare Daily  . fluticasone  1 puff Inhalation BID  . folic acid  1 mg Intravenous Daily  . irbesartan  150 mg Oral Daily  . loratadine  10 mg Oral Daily  . metoprolol tartrate  50 mg Oral BID  . pantoprazole  40 mg Oral Daily  . sodium chloride  3 mL Intravenous Q12H   Continuous Infusions: . sodium chloride 100 mL/hr at 09/24/14 1900   PRN Meds:.albuterol, morphine injection, ondansetron **OR** ondansetron (ZOFRAN) IV  Allergies as of 09/24/2014 - Review Complete 09/24/2014  Allergen Reaction Noted  . Contrast media [iodinated diagnostic agents]  10/24/2012    Family History  Problem Relation Age of Onset  . Cancer Mother   . Heart disease Father   . Stroke Maternal Grandmother   . Cancer Maternal Grandfather     History   Social History  . Marital Status: Married    Spouse Name: N/A  . Number of Children: N/A  . Years of Education: N/A   Occupational History  . Not on file.   Social History Main Topics  . Smoking status: Never Smoker   . Smokeless tobacco: Not on file  . Alcohol Use: No  . Drug Use: No  . Sexual Activity: Not on file   Other Topics  Concern  . Not on file   Social History Narrative    Review of Systems: 11 systems reviewed and negative except as stated in HPI; Admit H/P ROS also reviewed and agree  Physical Exam: Vital signs in last 24 hours: Temp:  [97.3 F (36.3 C)-98.1 F (36.7 C)] 98.1 F (36.7 C) (05/17 0744) Pulse Rate:  [50-62] 62 (05/17 0743) Resp:  [13-22] 15 (05/17 0743) BP: (107-169)/(56-82) 123/67 mmHg (05/17 0743) SpO2:  [95 %-100 %] 95 % (05/17 0743) Weight:  [91.6 kg (201 lb 15.1 oz)] 91.6 kg (201 lb 15.1 oz) (05/16 1831) Last BM Date: 09/24/14 General:   Alert,  Well-developed, well-nourished, pleasant and cooperative in NAD,  elderly Head:  Normocephalic and atraumatic. Eyes:  Sclera clear, no icterus.   Conjunctiva pink. Ears:  Normal auditory acuity. Nose:  No deformity Mouth:  No deformity or lesions.  Oropharynx pink & moist. Neck:  Supple; no masses or thyromegaly. Lungs:  Clear throughout to auscultation.   No wheezes, crackles, or rhonchi. No acute distress. Heart:  Regular rate and rhythm; no murmurs, clicks, rubs,  or gallops. Abdomen:  Soft, nontender and nondistended. No masses, hepatosplenomegaly or hernias noted. Normal bowel sounds, without guarding, and without rebound.   Rectal:  Deferred Msk:  Symmetrical without gross deformities. Normal posture. Pulses:  Normal pulses noted. Extremities:  Without clubbing or edema. Neurologic:  Alert and  oriented x4; Skin:  Intact without significant lesions or rashes. Psych:  Alert and cooperative. Normal mood and affect.   Lab Results:  Recent Labs  09/24/14 1414 09/24/14 1913 09/25/14 0515  WBC 6.7 8.1 6.2  HGB 11.7* 11.2* 10.1*  HCT 35.8* 34.8* 31.1*  PLT 201 171 152   BMET  Recent Labs  09/24/14 1414 09/25/14 0515  NA 141 140  K 4.4 4.3  CL 112* 112*  CO2 22 21*  GLUCOSE 112* 92  BUN 37* 32*  CREATININE 2.04* 1.59*  CALCIUM 9.1 8.4*   LFT Lab Results  Component Value Date   ALT 21 09/24/2014   AST 20  09/24/2014   ALKPHOS 43 09/24/2014   BILITOT 0.5 09/24/2014    Recent Labs  09/24/14 1414  PROT 5.8*  ALBUMIN 3.3*  AST 20  ALT 21  ALKPHOS 43  BILITOT 0.5   PT/INR  Recent Labs  09/24/14 1414  LABPROT 13.7  INR 1.04     Studies/Results: No results found.  Impression/Plan: GI bleed likely diverticular in origin and has resolved. Hemodynamically stable. Would not recommend a colonoscopy at this time and would recommend he f/u with Dr. Madilyn Fireman as an outpt and arrange a surveillance colonoscopy for this year as previously planned. Start full liquid diet and advance. If bleeding does not return and tolerates diet, then ok to go home tomorrow from a GI standpoint with f/u with Dr. Madilyn Fireman in 2-3 weeks.    LOS: 1 day   Vrinda Heckstall C.  09/25/2014, 9:33 AM  Pager [phone]  If no answer or after 5 PM call [phone]

## 2014-09-26 LAB — BASIC METABOLIC PANEL
Anion gap: 5 (ref 5–15)
BUN: 22 mg/dL — ABNORMAL HIGH (ref 6–20)
CO2: 23 mmol/L (ref 22–32)
CREATININE: 1.48 mg/dL — AB (ref 0.61–1.24)
Calcium: 9 mg/dL (ref 8.9–10.3)
Chloride: 112 mmol/L — ABNORMAL HIGH (ref 101–111)
GFR calc Af Amer: 51 mL/min — ABNORMAL LOW (ref >60)
GFR, EST NON AFRICAN AMERICAN: 44 mL/min — AB (ref >60)
GLUCOSE: 97 mg/dL (ref 65–99)
POTASSIUM: 4 mmol/L (ref 3.5–5.1)
Sodium: 140 mmol/L (ref 135–145)

## 2014-09-26 LAB — MAGNESIUM: MAGNESIUM: 2.1 mg/dL (ref 1.7–2.4)

## 2014-09-26 LAB — CBC
HCT: 30.4 % — ABNORMAL LOW (ref 39.0–52.0)
HEMOGLOBIN: 10.1 g/dL — AB (ref 13.0–17.0)
MCH: 30.8 pg (ref 26.0–34.0)
MCHC: 33.2 g/dL (ref 30.0–36.0)
MCV: 92.7 fL (ref 78.0–100.0)
Platelets: 154 10*3/uL (ref 150–400)
RBC: 3.28 MIL/uL — AB (ref 4.22–5.81)
RDW: 12.9 % (ref 11.5–15.5)
WBC: 5.3 10*3/uL (ref 4.0–10.5)

## 2014-09-26 MED ORDER — PRASUGREL HCL 10 MG PO TABS: 10.0000 mg | ORAL_TABLET | Freq: Every day | ORAL | Status: DC

## 2014-09-26 MED ORDER — ASPIRIN 81 MG PO TABS: 81.0000 mg | ORAL_TABLET | Freq: Every day | ORAL | Status: DC

## 2014-09-26 MED ORDER — FOLIC ACID 1 MG PO TABS
1.0000 mg | ORAL_TABLET | Freq: Every day | ORAL | Status: DC
  Administered 2014-09-26: 1 mg via ORAL
  Filled 2014-09-26: qty 1

## 2014-09-26 NOTE — Discharge Instructions (Signed)
Gastrointestinal Bleeding °Gastrointestinal bleeding is bleeding somewhere along the path that food travels through the body (digestive tract). This path is anywhere between the mouth and the opening of the butt (anus). You may have blood in your throw up (vomit) or in your poop (stools). If there is a lot of bleeding, you may need to stay in the hospital. °HOME CARE °· Only take medicine as told by your doctor. °· Eat foods with fiber such as whole grains, fruits, and vegetables. You can also try eating 1 to 3 prunes a day. °· Drink enough fluids to keep your pee (urine) clear or pale yellow. °GET HELP RIGHT AWAY IF:  °· Your bleeding gets worse. °· You feel dizzy, weak, or you pass out (faint). °· You have bad cramps in your back or belly (abdomen). °· You have large blood clumps (clots) in your poop. °· Your problems are getting worse. °MAKE SURE YOU:  °· Understand these instructions. °· Will watch your condition. °· Will get help right away if you are not doing well or get worse. °Document Released: 02/04/2008 Document Revised: 04/13/2012 Document Reviewed: 04/06/2011 °[employer]® Patient Information ©2015 [employer]. This information is not intended to replace advice given to you by your health care provider. Make sure you discuss any questions you have with your health care provider. ° ° °

## 2014-09-26 NOTE — Progress Notes (Signed)
Utilization review completed. Marielouise Amey, RN, BSN. 

## 2014-09-26 NOTE — Discharge Summary (Addendum)
DISCHARGE SUMMARY  Jorge Cherry  MR#: [id number]  DOB:02/18/1936  Date of Admission: 09/24/2014 Date of Discharge: 09/26/2014  Attending Physician:MCCLUNG,JEFFREY T  Patient's NWG:NFAO,ZHYQMVHQIO R, MD  Consults: [facility]   Disposition: D/C home   Follow-up Appts:     Follow-up Information    Schedule an appointment as soon as possible for a visit with Hoyle Sauer, MD.   Specialty:  Internal Medicine   Why:  You need to have a CBC checked in 3-5 days to assure your Hgb (blood count) is stable    Contact information:   [address] [place] [address] [phone]       Follow up with Nicki Guadalajara A, MD. Schedule an appointment as soon as possible for a visit in 2 weeks.   Specialty:  Cardiology   Why:  Make an appointment within 2 weeks to discuss ongoing use of Effient and aspirin    Contact information:   [address] [place] [address] [phone]       Follow up with HAYES,JOHN C, MD In 3 weeks.   Specialty:  Gastroenterology   Contact information:   [address] [place] [address] [phone]      Tests Needing Follow-up: -recheck of CBC is suggested in 3-5 days to assure Hgb is stable    Discharge Diagnoses: Lower GI bleed/Diverticulosis of colon with hemorrhage Coronary atherosclerosis of native coronary artery Symptomatic anemia Acute renal failure Hyperlipidemia with target LDL less than 70 Benign hypertensive heart disease without heart failure Aortic valve stenosis  Initial presentation: 79 year old M Hx CAD S/P CABG 21 years ago and drug-eluting stent to graft supplying diagonal 12 years ago with preserved LV function and mild aortic valve stenosis, Dyslipidemia, and a Diverticular bleed previously evaluated by Dr. Madilyn Fireman.  Patient was sent over from [facility] after presenting to the office with acute onset of bloody diarrhea on 5/15. Patient reported he had 4 bloody stools. He  also endorsed a sensation of feeling weak and dizzy but was without chest pain. While in the ER waiting room he developed "cold sweats" but did not have any nausea or vomiting. He denied abdominal pain.   In the ER he was afebrile, his blood pressure was soft at 110/68 sitting upright with a pulse of 55, respirations 14 and room air saturations 100 percent. Hemoglobin had also decreased slightly to 11.7 from a baseline of 13.4.   Hospital Course:  Lower GI bleed/Diverticulosis of colon with hemorrhage -GI Suspects underlying diverticular bleed which which has stopped. No colonoscopy indicated at this time. -Diet advanced and tolerated  -Follow-up with Dr. Madilyn Fireman 3 weeks  Coronary atherosclerosis of native coronary artery -CABG revascularization 21 years ago and drug-eluting stent to graft 12 years ago -Last outpatient cardiac evaluation 04/12/14 and has been stable on Effient -Would hold all anticoagulants/antiplatelets for 2 weeks. Should be no problem as all cardiac procedures performed decades ago. -Will have patient follow-up with cardiologist at discharge  Symptomatic anemia -Hgb holding steady at ~10 -did not require transfusion   Acute on chronic renal failure -crt continues to improve, w/ crt at d/c 1.48 - this appears to be his baseline   Hyperlipidemia with target LDL less than 70 -Restart home medications  Benign hypertensive heart disease without heart failure -Continue usual home medications   Aortic valve stenosis -Per patient and wife cardiologist aware of condition of aortic valve. Patient states has been informed valve will need to be replaced in  the near future.      Medication List    STOP taking these medications        diltiazem 240 MG 24 hr capsule  Commonly known as:  DILACOR XR      TAKE these medications        aspirin 81 MG tablet  Take 1 tablet (81 mg total) by mouth daily.  Start taking on:  10/08/2014     atorvastatin 40 MG tablet    Commonly known as:  LIPITOR  Take 20 mg by mouth daily.     diltiazem 240 MG 24 hr capsule  Commonly known as:  CARDIZEM CD  TAKE 1 CAPSULE (240 MG TOTAL) BY MOUTH DAILY.     DYMISTA 137-50 MCG/ACT Susp  Generic drug:  Azelastine-Fluticasone  Place 1 spray into both nostrils daily.     ezetimibe 10 MG tablet  Commonly known as:  ZETIA  Take 1 tablet (10 mg total) by mouth daily.     fenofibrate 145 MG tablet  Commonly known as:  TRICOR  Take 72.5 mg by mouth daily.     fish oil-omega-3 fatty acids 1000 MG capsule  Take 2 capsules (2 g total) by mouth 2 (two) times daily.     Garlic Oil  Take 1 capsule by mouth daily.     loratadine 10 MG tablet  Commonly known as:  CLARITIN  Take 10 mg by mouth daily.     metoprolol succinate 100 MG 24 hr tablet  Commonly known as:  TOPROL-XL  Take 1 tablet (100 mg total) by mouth daily. Take with or immediately following a meal.     pantoprazole 40 MG tablet  Commonly known as:  PROTONIX  Take 40 mg by mouth daily.     prasugrel 10 MG Tabs tablet  Commonly known as:  EFFIENT  Take 1 tablet (10 mg total) by mouth daily.  Start taking on:  10/08/2014     PROAIR HFA 108 (90 BASE) MCG/ACT inhaler  Generic drug:  albuterol  Inhale 1 puff into the lungs as needed for wheezing or shortness of breath.     QVAR 80 MCG/ACT inhaler  Generic drug:  beclomethasone  Inhale 2 puffs into the lungs daily.     spironolactone 25 MG tablet  Commonly known as:  ALDACTONE  TAKE 0.5 TABLETS (12.5 MG TOTAL) BY MOUTH DAILY.     telmisartan 80 MG tablet  Commonly known as:  MICARDIS  Take 80 mg by mouth daily.     torsemide 20 MG tablet  Commonly known as:  DEMADEX  Take 1 tablet (20 mg total) by mouth 2 (two) times daily.     vitamin C 500 MG tablet  Commonly known as:  ASCORBIC ACID  Take 500 mg by mouth daily.     VITAMIN D-3 PO  Take 750 Units by mouth daily.     vitamin E 400 UNIT capsule  Take 400 Units by mouth daily.     Zinc  50 MG Caps  Take 1 capsule by mouth 2 (two) times a week.        Day of Discharge BP 162/82 mmHg  Pulse 62  Temp(Src) 97.9 F (36.6 C) (Oral)  Resp 15  Ht 5\' 10"  (1.778 m)  Wt 91.6 kg (201 lb 15.1 oz)  BMI 28.98 kg/m2  SpO2 96%  Physical Exam: General: No acute respiratory distress Lungs: Clear to auscultation bilaterally without wheezes or crackles Cardiovascular: Regular rate and rhythm with 3/6  holosystolic M  Abdomen: Nontender, nondistended, soft, bowel sounds positive, no rebound, no ascites, no appreciable mass Extremities: No significant cyanosis, clubbing, or edema bilateral lower extremities  Basic Metabolic Panel:  Recent Labs Lab 09/24/14 1414 09/25/14 0515 09/26/14 0457  NA 141 140 140  K 4.4 4.3 4.0  CL 112* 112* 112*  CO2 22 21* 23  GLUCOSE 112* 92 97  BUN 37* 32* 22*  CREATININE 2.04* 1.59* 1.48*  CALCIUM 9.1 8.4* 9.0  MG  --   --  2.1    Liver Function Tests:  Recent Labs Lab 09/24/14 1414  AST 20  ALT 21  ALKPHOS 43  BILITOT 0.5  PROT 5.8*  ALBUMIN 3.3*   Coags:  Recent Labs Lab 09/24/14 1414  INR 1.04   CBC:  Recent Labs Lab 09/24/14 1414 09/24/14 1913 09/25/14 0515 09/25/14 1712 09/26/14 0457  WBC 6.7 8.1 6.2 5.3 5.3  HGB 11.7* 11.2* 10.1* 10.4* 10.1*  HCT 35.8* 34.8* 31.1* 32.2* 30.4*  MCV 92.7 93.0 93.1 92.8 92.7  PLT 201 171 152 157 154    Recent Results (from the past 240 hour(s))  MRSA PCR Screening     Status: None   Collection Time: 09/24/14  6:30 PM  Result Value Ref Range Status   MRSA by PCR NEGATIVE NEGATIVE Final    Comment:        The GeneXpert MRSA Assay (FDA approved for NASAL specimens only), is one component of a comprehensive MRSA colonization surveillance program. It is not intended to diagnose MRSA infection nor to guide or monitor treatment for MRSA infections.       Time spent in discharge (includes decision making & examination of pt): >30 minutes  09/26/2014, 10:09 AM    Lonia BloodJeffrey T. McClung, MD [facility] Office  [phone] Pager [phone]  On-Call/Text Page:      [email or web address]      password [email or web address]

## 2014-09-26 NOTE — Progress Notes (Signed)
Pt given discharge instructions with wife and they verbalized understanding. IV removed and Pt discharge home with wife.

## 2014-10-02 ENCOUNTER — Encounter: Payer: Self-pay | Admitting: Cardiovascular Disease

## 2014-10-02 ENCOUNTER — Ambulatory Visit (INDEPENDENT_AMBULATORY_CARE_PROVIDER_SITE_OTHER): Payer: Medicare Other | Admitting: Cardiovascular Disease

## 2014-10-02 VITALS — BP 140/78 | HR 49 | Ht 70.0 in | Wt 208.4 lb

## 2014-10-02 DIAGNOSIS — K5731 Diverticulosis of large intestine without perforation or abscess with bleeding: Secondary | ICD-10-CM

## 2014-10-02 DIAGNOSIS — Z951 Presence of aortocoronary bypass graft: Secondary | ICD-10-CM | POA: Diagnosis not present

## 2014-10-02 DIAGNOSIS — I2510 Atherosclerotic heart disease of native coronary artery without angina pectoris: Secondary | ICD-10-CM | POA: Diagnosis not present

## 2014-10-02 DIAGNOSIS — I350 Nonrheumatic aortic (valve) stenosis: Secondary | ICD-10-CM | POA: Diagnosis not present

## 2014-10-02 DIAGNOSIS — E785 Hyperlipidemia, unspecified: Secondary | ICD-10-CM

## 2014-10-02 DIAGNOSIS — D649 Anemia, unspecified: Secondary | ICD-10-CM

## 2014-10-02 MED ORDER — CLOPIDOGREL BISULFATE 75 MG PO TABS: 75.0000 mg | ORAL_TABLET | Freq: Every day | ORAL | Status: DC

## 2014-10-02 NOTE — Patient Instructions (Addendum)
Your physician has recommended you make the following change in your medication: DO NOT RESTART EFFIENT. This has been replaced with generic plavix. To be started on 5/30. Then 1 week later restart the asiprin every other day for 1 week then go to daily.if no problems noted.  Your physician recommends that you schedule a follow-up appointment  in 4 months.  Your physician has requested that you have an echocardiogram. Echocardiography is a painless test that uses sound waves to create images of your heart. It provides your doctor with information about the size and shape of your heart and how well your heart's chambers and valves are working. This procedure takes approximately one hour. There are no restrictions for this procedure. This will be done in 3 months.

## 2014-10-03 ENCOUNTER — Other Ambulatory Visit: Payer: Self-pay | Admitting: Cardiovascular Disease

## 2014-10-03 MED ORDER — NITROGLYCERIN 0.4 MG/SPRAY TL SOLN: 1.0000 | Status: DC | PRN

## 2014-10-03 NOTE — Telephone Encounter (Signed)
°  1. Which medications need to be refilled?Nitroglycerin Spray-Please call this in today-had chest pains last week  2. Which pharmacy is medication to be sent to?[phone] 3. Do they need a 30 day or 90 day supply?  4. Would they like a call back once the medication has been sent to the pharmacy? yes

## 2014-10-04 ENCOUNTER — Encounter: Payer: Self-pay | Admitting: Cardiovascular Disease

## 2014-10-04 NOTE — Progress Notes (Signed)
Patient ID: [name], male   DOB: 05-31-1935, 79 y.o.   MRN: [id number]     HPI: Leopoldo FARMER MCCAHILL is a 79 y.o. male  who presents to the office today for a 5 month followup cardiology evaluation.   Mr. Unangst has established CAD dating back to 1994 at which time he underwent CABG revascularization surgery. In October 2003 he underwent stenting to the proximal portion of the vein graft supplying the diagonal vessel with a 3.5x16 mm Taxus DES stent post dilated to 4.0 mm. He has diffuse disease in the distal apical portion of the LAD beyond the LIMA insertion  treated medically. He has documented mild aortic valve stenosis with grade 2 diastolic dysfunction with concentric left ventricular hypertrophy. He has documented renal cysts, history of hypertension, mixed hyperlipidemia. His last Myoview study was in June 2013 which showed a minimal apical defect. Post-stess ejection fraction was 56%.  When I saw him in March 2014 he was complaining that at times he felt like he was "zoning out."  At that time, I reduced his diltiazem from 300 mg to 240 mg. He felt that this has significantly improved his symptoms with this change and he denies any further sensation. On echo Doppler study, his peak instantaneous gradient across his aortic valve is 25 mm with a mean gradient of only 12 mm an aortic valve area 1.8 cm. He presents today for followup evaluation.  He has hyperlipidemia and in June 2014 his triglycerides were 231 and I further titrated his fish oil to 2 capsules twice a day. He did have repeat blood work in August 2014 week which showed a BUN of 26 Cr1.67 which improved from  1.71 in June. His lipid panel was improved with a total cholesterol from 172-150. Triglycerides improved from 231-151. HDL remained low at 34. LDL is 86.  He does have hearing loss. He denies chest pain. He denies PND or orthopnea. He does note some occasional leg swelling.  He underwent a follow-up echo Doppler study on  07/26/2013.  This showed an ejection fraction of 55-60%.  He had normal diastolic function.  There was evidence for mild aortic valve stenosis with a mean gradient of 11 and a peak gradient of 21 with an estimated aortic valve area of 1.54 cm.  He had mild left atrial dilatation.   An NMR profile  showed increased LDL particle #[id number] despite a calculated LDL of 69.  Triglycerides were still elevated at 182 and HDL cholesterol was low at 32.  Insulin resistance score was increased at 77.  TSH was normal.  Since I last saw him, he was hospitalized from May 16 through 09/26/2014 with a lower GI bleed due to diverticulosis of the colon with hemorrhage.  He did not undergo colonoscopy.  At that time, he was told to hold his eloquence and aspirin and to resume this on May 30.  He is presents for cardiology evaluation.  Past Medical History  Diagnosis Date  . Aortic valve disorder     2D ECHO, 10/19/2011 - EF >55%, LA mild-moderately dilated, mild-moderate tricuspid regurgitation, mild-moderate valvular aortic stenosis, moderate calcification of aortic valve leaflets  . DOE (dyspnea on exertion)     LEXISCAN, 11/05/2011 - Non-diagnositc for ischemia, post-stress EF 56%, small fixed apical inferior, apical lateral defect which may represent scar  . Atherosclerosis of renal artery     RENAL DOPPLER, 12/10/2011 - Left renal artery demonstrated narrowing with elevated velocities consistent with a 1-59% diameter reduction  Past Surgical History  Procedure Laterality Date  . Cardiac catheterization  02/27/2004    Coronary intervention and medical management  . Cardiac catheterization  03/04/2004    SVG supplying the diagonal vessel stented with a 3.5x60m Taxus stent post dilated to 4.0 mm    Allergies  Allergen Reactions  . Contrast Media [Iodinated Diagnostic Agents]     Current Outpatient Prescriptions  Medication Sig Dispense Refill  . [START ON 10/08/2014] aspirin 81 MG tablet Take 1 tablet (81  mg total) by mouth daily. 30 tablet   . atorvastatin (LIPITOR) 40 MG tablet Take 20 mg by mouth daily.    . Cholecalciferol (VITAMIN D-3 PO) Take 750 Units by mouth daily.    . clopidogrel (PLAVIX) 75 MG tablet Take 1 tablet (75 mg total) by mouth daily. 30 tablet 6  . diltiazem (CARDIZEM CD) 240 MG 24 hr capsule TAKE 1 CAPSULE (240 MG TOTAL) BY MOUTH DAILY. 30 capsule 6  . DYMISTA 137-50 MCG/ACT SUSP Place 1 spray into both nostrils daily.    .Marland Kitchenezetimibe (ZETIA) 10 MG tablet Take 1 tablet (10 mg total) by mouth daily. 90 tablet 3  . fenofibrate (TRICOR) 145 MG tablet Take 72.5 mg by mouth daily.     . fish oil-omega-3 fatty acids 1000 MG capsule Take 2 capsules (2 g total) by mouth 2 (two) times daily. 120 capsule 11  . Garlic OIL Take 1 capsule by mouth daily.    .Marland Kitchenloratadine (CLARITIN) 10 MG tablet Take 10 mg by mouth daily.    . metoprolol succinate (TOPROL-XL) 100 MG 24 hr tablet Take 1 tablet (100 mg total) by mouth daily. Take with or immediately following a meal. 90 tablet 3  . nitroGLYCERIN (NITROLINGUAL) 0.4 MG/SPRAY spray Place 1 spray under the tongue every 5 (five) minutes x 3 doses as needed for chest pain. 12 g 2  . pantoprazole (PROTONIX) 40 MG tablet Take 40 mg by mouth daily.     .Marland KitchenPROAIR HFA 108 (90 BASE) MCG/ACT inhaler Inhale 1 puff into the lungs as needed for wheezing or shortness of breath.     .Marland KitchenQVAR 80 MCG/ACT inhaler Inhale 2 puffs into the lungs daily.  1  . spironolactone (ALDACTONE) 25 MG tablet TAKE 0.5 TABLETS (12.5 MG TOTAL) BY MOUTH DAILY. 15 tablet 7  . telmisartan (MICARDIS) 80 MG tablet Take 80 mg by mouth daily.    .Marland Kitchentorsemide (DEMADEX) 20 MG tablet Take 1 tablet (20 mg total) by mouth 2 (two) times daily. 180 tablet 3  . vitamin C (ASCORBIC ACID) 500 MG tablet Take 500 mg by mouth daily.    . vitamin E 400 UNIT capsule Take 400 Units by mouth daily.    . Zinc 50 MG CAPS Take 1 capsule by mouth 2 (two) times a week.     No current facility-administered  medications for this visit.    Socially he is married and has 2 children and 4 grandchildren. There is no tobacco alcohol use. Recently he was has been very active.  ROS General: Negative; No fevers, chills, or night sweats;  HEENT: Negative; No changes in vision or hearing, sinus congestion, difficulty swallowing Pulmonary: Negative; No cough, wheezing, shortness of breath, hemoptysis Cardiovascular: Positive for aortic stenosis  No chest pain, presyncope, syncope, palpitations GI: Positive for recent GI bleed secondary to diverticular disease GU: Negative; No dysuria, hematuria, or difficulty voiding Musculoskeletal: Negative; no myalgias, joint pain, or weakness Hematologic/Oncology: Negative; no easy bruising, bleeding Endocrine: Negative;  no heat/cold intolerance; no diabetes Neuro: Negative; no changes in balance, headaches Skin: Negative; No rashes or skin lesions Psychiatric: Negative; No behavioral problems, depression Sleep: Negative; No snoring, daytime sleepiness, hypersomnolence, bruxism, restless legs, hypnogognic hallucinations, no cataplexy Other comprehensive 14 point system review is negative.  PE BP 140/78 mmHg  Pulse 49  Ht _0  (1.778 m)  Wt 208 lb 6.4 oz (94.53 kg)  BMI 29.90 kg/m2  General: Alert, oriented, no distress.  Skin: normal turgor, no rashes HEENT: Normocephalic, atraumatic. Pupils round and reactive; sclera anicteric;no lid lag,  Nose without nasal septal hypertrophy Mouth/Parynx benign; Mallinpatti scale 2/3 Neck: No JVD, no carotid bruits Lungs: clear to ausculatation and percussion; no wheezing or rales Heart: RRR, s1 s2 normal 2/6 sem in the aortic region. No S3 gallop. No diastolic murmur. No rubs thrills or heaves. Chest wall has a mild pectus excavatum deformity  Abdomen: soft, nontender; no hepatosplenomehaly, BS+; abdominal aorta nontender and not dilated by palpation. He does have mild diastases recti Back: No CVA tenderness Pulses  2+ Extremities: Trace edema in the  lower extremities; no clubbing cyanosis, Homan's sign negative  Neurologic: grossly nonfocal; cranial nerves normal Psychological: Normal affect and mood  ECG (independently read by me): Sinus bradycardia at 49 bpm with first-degree AV block with a PR interval 218 ms.  Right reticular conduction delay.  December 2015 ECG (independently read by me).  For these: Sinus bradycardia 57 bpm.  First-degree AV block with a PR interval at 224 ms.  No significant ST-T changes.  March 2015 ECG (independently read by me): Sinus rhythm at 59 beats per minute. Mild LVH by voltage criteria in aVL. Normal intervals.  Prior 01/05/2013 ECG: Normal sinus rhythm at 62. Mild RV conduction delay. PR interval 204 ms, QTc interval 452 ms.  LABS: BMP Latest Ref Rng 09/26/2014 09/25/2014 09/24/2014  Glucose 65 - 99 mg/dL 97 92 112(H)  BUN 6 - 20 mg/dL 22(H) 32(H) 37(H)  Creatinine 0.61 - 1.24 mg/dL 1.48(H) 1.59(H) 2.04(H)  Sodium 135 - 145 mmol/L 140 140 141  Potassium 3.5 - 5.1 mmol/L 4.0 4.3 4.4  Chloride 101 - 111 mmol/L 112(H) 112(H) 112(H)  CO2 22 - 32 mmol/L 23 21(L) 22  Calcium 8.9 - 10.3 mg/dL 9.0 8.4(L) 9.1   Hepatic Function Latest Ref Rng 09/24/2014 04/09/2014 07/12/2013  Total Protein 6.5 - 8.1 g/dL 5.8(L) 6.5 6.4  Albumin 3.5 - 5.0 g/dL 3.3(L) 4.0 4.1  AST 15 - 41 U/L _1 ALT 17 - 63 U/L _2 Alk Phosphatase 38 - 126 U/L 43 46 50  Total Bilirubin 0.3 - 1.2 mg/dL 0.5 0.4 0.6   CBC Latest Ref Rng 09/26/2014 09/25/2014 09/25/2014  WBC 4.0 - 10.5 K/uL 5.3 5.3 6.2  Hemoglobin 13.0 - 17.0 g/dL 10.1(L) 10.4(L) 10.1(L)  Hematocrit 39.0 - 52.0 % 30.4(L) 32.2(L) 31.1(L)  Platelets 150 - 400 K/uL 154 157 152   Lab Results  Component Value Date   MCV 92.7 09/26/2014   MCV 92.8 09/25/2014   MCV 93.1 09/25/2014   Lab Results  Component Value Date   TSH 3.007 04/09/2014  No results found for: HGBA1C  Lipid Panel     Component Value Date/Time   CHOL 137  04/09/2014 1115   CHOL 150 12/30/2012 0944   TRIG 182* 04/09/2014 1115   TRIG 151* 12/30/2012 0944   HDL 32* 04/09/2014 1115   HDL 34* 12/30/2012 0944   CHOLHDL 4.4 12/30/2012 0944   VLDL  30 12/30/2012 0944   LDLCALC 69 04/09/2014 1115   LDLCALC 86 12/30/2012 0944     RADIOLOGY: No results found.    ASSESSMENT AND PLAN: Mr. Mikles is a 79 year old white male who is now 22 years status post CABG revascularization surgery.  He is not having any recurrent anginal symptoms and is status post DES stenting to the graft supplying the diagonal vessel 12 years ago.  His last echo Doppler study demonstrated mild aortic valve stenosis with normal systolic function.  He is on combination therapy for hypertension including beta blocker, ARB, and diuretic. His blood pressure today is stable and on repeat by me was 142/70. A prior  NMR LipoProfile  showed an increased LDL particle number despite his current therapy for this reason, Zetia was added to his medical regimen at his last office visit to take in combination with  atorvastatin as well as fenofibrate and fish oil.  I reviewed his recent hospitalization where he developed symptomatic anemia due to diverticular bleed.  He has been off aspirin and Effient.  He is supposed to restart this on May 30.  I have recommended that he not restart Effient but that he start a less potent anti-platelet drug such as Plavix 75 mg daily.  I told him not to start the aspirin at that time but one week later, he can add baby aspirin 81 mg every other day and if tolerated after 1-2 weeks.  Can increase this to daily.  In 3 months, I am scheduling him for an 18 month follow-up echo Doppler study.  I will see him in 4 months for reevaluation.  Time spent: 25 minutes  Troy Sine, MD, [facility]  10/04/2014 9:17 PM

## 2014-10-11 NOTE — Addendum Note (Signed)
Addended byGaynelle Cage: Jorge Cherry M. on: 10/11/2014 08:16 AM   Modules accepted: Orders

## 2014-10-12 ENCOUNTER — Encounter: Payer: Self-pay | Admitting: *Deleted

## 2014-10-12 ENCOUNTER — Other Ambulatory Visit: Payer: Self-pay | Admitting: Cardiovascular Disease

## 2014-10-12 NOTE — Telephone Encounter (Signed)
Rx has been sent to the pharmacy electronically. ° °

## 2014-10-25 ENCOUNTER — Telehealth: Payer: Self-pay | Admitting: Cardiovascular Disease

## 2014-10-25 NOTE — Telephone Encounter (Signed)
Returned call to [place] at [facility] office.Dr.Kelly advised ok to hold plavix 5 days prior to colonoscopy.Note faxed to fax # [phone].

## 2014-10-25 NOTE — Telephone Encounter (Signed)
Ok to hold for 5 days

## 2014-10-25 NOTE — Telephone Encounter (Signed)
She sent over fax on 6-13 to clear pt for surgery,have not received it back.Pt is scheduled for surgery on 11-05-14. Would you please fax this back to [phone] [id number].

## 2014-10-25 NOTE — Telephone Encounter (Signed)
Message sent to [facility] for advice if ok for patient to hold plavix 5 to 7 days prior to colonoscopy.

## 2014-10-25 NOTE — Telephone Encounter (Signed)
Unable to locate clearance letter - called back to request caller to resend. Spoke to office assistant, left message w/ my phone number and extension.

## 2014-10-25 NOTE — Telephone Encounter (Signed)
Colonoscopy clearance from [facility]requesting to hold coumadin 5 to 7 days prior to colonoscopy faxed to [facility] at [facility] office.

## 2014-10-25 NOTE — Telephone Encounter (Signed)
Pt is not on Coumadin.  They are requesting clearance to hold Plavix.  This will need to be addressed by Dr. Tresa Endo.

## 2014-11-05 ENCOUNTER — Other Ambulatory Visit: Payer: Self-pay | Admitting: Cardiovascular Disease

## 2014-11-06 NOTE — Telephone Encounter (Signed)
Rx(s) sent to pharmacy electronically.  

## 2014-11-13 ENCOUNTER — Other Ambulatory Visit: Payer: Self-pay | Admitting: Cardiovascular Disease

## 2014-12-17 ENCOUNTER — Other Ambulatory Visit: Payer: Self-pay | Admitting: Cardiovascular Disease

## 2014-12-17 NOTE — Telephone Encounter (Signed)
Rx(s) sent to pharmacy electronically.  

## 2014-12-24 ENCOUNTER — Telehealth: Payer: Self-pay | Admitting: Cardiovascular Disease

## 2014-12-24 NOTE — Telephone Encounter (Signed)
Genevie Cheshire able to put pt on Dr. Landry Dyke calendar for Wednesday. Pt instructed to call if problems prior to then. Will close encounter.

## 2014-12-24 NOTE — Telephone Encounter (Signed)
Mrs. Vangieson states Ichiro has been dizzy and his blood pressure has been running low---106/102.   Please call and advise.

## 2014-12-24 NOTE — Telephone Encounter (Signed)
Called and spoke to patient.  He states "dizziness" for about 4 days, low BP (104/59 at last check), HR running "a little low" (45 when checked today).  Describes lightheadedness on standing, primarily. He is not really having dizziness akin to "room spinning" when asked.  Pt notes he has tried several meds to lower BP w/ longstanding HTN. Currently has been taking garlic pills, 4 pills per day. This has been effective at getting BP down. States lowest it used to run was 140/80. Now worried about having problem of BP being too low.  Pt also states "low energy". He is compliant w/ other meds including metoprolol.  Advised Dr. Tresa Endo may be able to recommend changes, or pt may need appt w/ extender for med adjustment. Will defer to Dr. Tresa Endo for decision. Pt agreeable to plan.

## 2014-12-25 NOTE — Telephone Encounter (Signed)
ok 

## 2014-12-26 ENCOUNTER — Ambulatory Visit (INDEPENDENT_AMBULATORY_CARE_PROVIDER_SITE_OTHER): Payer: Medicare Other | Admitting: Cardiovascular Disease

## 2014-12-26 ENCOUNTER — Encounter: Payer: Self-pay | Admitting: Cardiovascular Disease

## 2014-12-26 VITALS — BP 142/84 | HR 57 | Ht 70.0 in | Wt 205.2 lb

## 2014-12-26 DIAGNOSIS — N189 Chronic kidney disease, unspecified: Secondary | ICD-10-CM | POA: Diagnosis not present

## 2014-12-26 DIAGNOSIS — I25810 Atherosclerosis of coronary artery bypass graft(s) without angina pectoris: Secondary | ICD-10-CM

## 2014-12-26 DIAGNOSIS — Z79899 Other long term (current) drug therapy: Secondary | ICD-10-CM

## 2014-12-26 DIAGNOSIS — E785 Hyperlipidemia, unspecified: Secondary | ICD-10-CM

## 2014-12-26 DIAGNOSIS — N183 Chronic kidney disease, stage 3 unspecified: Secondary | ICD-10-CM

## 2014-12-26 DIAGNOSIS — I2510 Atherosclerotic heart disease of native coronary artery without angina pectoris: Secondary | ICD-10-CM | POA: Diagnosis not present

## 2014-12-26 DIAGNOSIS — Z951 Presence of aortocoronary bypass graft: Secondary | ICD-10-CM

## 2014-12-26 DIAGNOSIS — I350 Nonrheumatic aortic (valve) stenosis: Secondary | ICD-10-CM

## 2014-12-26 DIAGNOSIS — I119 Hypertensive heart disease without heart failure: Secondary | ICD-10-CM

## 2014-12-26 LAB — BASIC METABOLIC PANEL
BUN: 27 mg/dL — AB (ref 7–25)
CHLORIDE: 108 mmol/L (ref 98–110)
CO2: 23 mmol/L (ref 20–31)
Calcium: 9.4 mg/dL (ref 8.6–10.3)
Creat: 1.67 mg/dL — ABNORMAL HIGH (ref 0.70–1.18)
Glucose, Bld: 91 mg/dL (ref 65–99)
POTASSIUM: 4.8 mmol/L (ref 3.5–5.3)
Sodium: 142 mmol/L (ref 135–146)

## 2014-12-26 LAB — CBC
HEMATOCRIT: 39.9 % (ref 39.0–52.0)
Hemoglobin: 13 g/dL (ref 13.0–17.0)
MCH: 29.3 pg (ref 26.0–34.0)
MCHC: 32.6 g/dL (ref 30.0–36.0)
MCV: 90.1 fL (ref 78.0–100.0)
MPV: 11.2 fL (ref 8.6–12.4)
Platelets: 232 10*3/uL (ref 150–400)
RBC: 4.43 MIL/uL (ref 4.22–5.81)
RDW: 13.3 % (ref 11.5–15.5)
WBC: 7.7 10*3/uL (ref 4.0–10.5)

## 2014-12-26 NOTE — Patient Instructions (Signed)
Your physician recommends that you return for lab work today.  Your physician recommends that you schedule a follow-up appointment  KEEP APPOINTMENTS ALREADY SCHEDULED.

## 2014-12-28 ENCOUNTER — Encounter: Payer: Self-pay | Admitting: Cardiovascular Disease

## 2014-12-28 DIAGNOSIS — N183 Chronic kidney disease, stage 3 unspecified: Secondary | ICD-10-CM | POA: Insufficient documentation

## 2014-12-28 NOTE — Progress Notes (Signed)
Patient ID: Jorge Cherry, male   DOB: 05/02/1936, 79 y.o.   MRN: [id number]     HPI: Jorge Cherry is a 79 y.o. male  who presents to the office today for a 3 month followup cardiology evaluation.   Jorge Cherry has established CAD dating back to 1994 at which time he underwent CABG revascularization surgery. In October 2003 he underwent stenting to the proximal portion of the vein graft supplying the diagonal vessel with a 3.5x16 mm Taxus DES stent post dilated to 4.0 mm. He has diffuse disease in the distal apical portion of the LAD beyond the LIMA insertion  treated medically. He has documented mild aortic valve stenosis with grade 2 diastolic dysfunction with concentric left ventricular hypertrophy. He has documented renal cysts, history of hypertension, mixed hyperlipidemia. His last Myoview study was in June 2013 which showed a minimal apical defect. Post-stess ejection fraction was 56%.  In March 2014 he was complaining that at times he felt like he was "zoning out."  At that time, I reduced his diltiazem from 300 mg to 240 mg. He felt that this has significantly improved his symptoms with this change and he denies any further sensation. On echo Doppler study, his peak instantaneous gradient across his aortic valve is 25 mm with a mean gradient of only 12 mm an aortic valve area 1.8 cm.   He has hyperlipidemia and in June 2014 his triglycerides were 231 and I further titrated his fish oil to 2 capsules twice a day. Repeat blood work in August 2014 week  showed a BUN of 26 Cr1.67 which improved from  1.71 in June. His lipid panel was improved with a total cholesterol from 172-150. Triglycerides improved from 231-151. HDL remained low at 34. LDL was 86.  A follow-up echo Doppler study on 07/26/2013 showed an ejection fraction of 55-60%.  He had normal diastolic function.  There was evidence for mild aortic valve stenosis with a mean gradient of 11 and a peak gradient of 21 with an estimated aortic valve  area of 1.54 cm.  He had mild left atrial dilatation.   An NMR profile  showed increased LDL particle #[id number] despite a calculated LDL of 69.  Triglycerides were still elevated at 182 and HDL cholesterol was low at 32.  Insulin resistance score was increased at 77.  TSH was normal.  He was hospitalized from May 16 through 09/26/2014 with a lower GI bleed due to diverticulosis of the colon with hemorrhage.  He did not undergo colonoscopy.  At that time, he was told to hold his eliquis and aspirin and to resume this on May 30.    I saw him in follow-up of his hospitalization.  He was not having any chest pain or shortness of breath.  I recommended that he not restart Effient but instead start Plavix initially and if he tolerated this from a GI standpoint to then resume 81 mg aspirin.  He states he has had some blood pressure lability recently with at times recorded blood pressures close to 200 and as low as 100.  When his blood pressures have been significantly elevated.  He is taking garlic tablets and he states this has resulted in a 20 mm drop.  At present he is on my Cardis 80 mg, torsemide 20 mg twice a day, Spiriva lactone 12.5 mg daily, Toprol-XL 100 mg daily in addition to Cardizem CD 240 mg.  He is unaware of any recurrent arrhythmia.  He continues to   be on atorvastatin 2.  Zetia 10 mg and fenofibrate.  He presents for evaluation  Past Medical History  Diagnosis Date  . Aortic valve disorder     2D ECHO, 10/19/2011 - EF >55%, LA mild-moderately dilated, mild-moderate tricuspid regurgitation, mild-moderate valvular aortic stenosis, moderate calcification of aortic valve leaflets  . DOE (dyspnea on exertion)     LEXISCAN, 11/05/2011 - Non-diagnositc for ischemia, post-stress EF 56%, small fixed apical inferior, apical lateral defect which may represent scar  . Atherosclerosis of renal artery     RENAL DOPPLER, 12/10/2011 - Left renal artery demonstrated narrowing with elevated velocities consistent  with a 1-59% diameter reduction    Past Surgical History  Procedure Laterality Date  . Cardiac catheterization  02/27/2004    Coronary intervention and medical management  . Cardiac catheterization  03/04/2004    SVG supplying the diagonal vessel stented with a 3.5x37m Taxus stent post dilated to 4.0 mm    Allergies  Allergen Reactions  . Contrast Media [Iodinated Diagnostic Agents]     Current Outpatient Prescriptions  Medication Sig Dispense Refill  . aspirin 81 MG tablet Take 1 tablet (81 mg total) by mouth daily. 30 tablet   . atorvastatin (LIPITOR) 40 MG tablet Take 20 mg by mouth daily.    . Cholecalciferol (VITAMIN D-3 PO) Take 750 Units by mouth daily.    . clopidogrel (PLAVIX) 75 MG tablet Take 1 tablet (75 mg total) by mouth daily. 30 tablet 6  . diltiazem (CARDIZEM CD) 240 MG 24 hr capsule TAKE 1 CAPSULE (240 MG TOTAL) BY MOUTH DAILY. 30 capsule 6  . DYMISTA 137-50 MCG/ACT SUSP Place 1 spray into both nostrils daily.    .Marland Kitchenezetimibe (ZETIA) 10 MG tablet Take 1 tablet (10 mg total) by mouth daily. 90 tablet 3  . fenofibrate (TRICOR) 145 MG tablet Take 72.5 mg by mouth daily.     . fish oil-omega-3 fatty acids 1000 MG capsule Take 2 capsules (2 g total) by mouth 2 (two) times daily. 120 capsule 11  . Garlic OIL Take 1 capsule by mouth daily.    .Marland Kitchenloratadine (CLARITIN) 10 MG tablet Take 10 mg by mouth daily.    . metoprolol succinate (TOPROL-XL) 100 MG 24 hr tablet Take 1 tablet (100 mg total) by mouth daily. Take with or immediately following a meal. 90 tablet 3  . nitroGLYCERIN (NITROLINGUAL) 0.4 MG/SPRAY spray Place 1 spray under the tongue every 5 (five) minutes x 3 doses as needed for chest pain. 12 g 2  . pantoprazole (PROTONIX) 40 MG tablet Take 40 mg by mouth daily.     .Marland KitchenPROAIR HFA 108 (90 BASE) MCG/ACT inhaler Inhale 1 puff into the lungs as needed for wheezing or shortness of breath.     .Marland KitchenQVAR 80 MCG/ACT inhaler Inhale 2 puffs into the lungs daily.  1  .  spironolactone (ALDACTONE) 25 MG tablet TAKE 0.5 TABLETS (12.5 MG TOTAL) BY MOUTH DAILY. 15 tablet 7  . telmisartan (MICARDIS) 80 MG tablet Take 80 mg by mouth daily.    .Marland Kitchentorsemide (DEMADEX) 20 MG tablet Take 1 tablet (20 mg total) by mouth 2 (two) times daily. 180 tablet 3  . vitamin C (ASCORBIC ACID) 500 MG tablet Take 500 mg by mouth daily.    . vitamin E 400 UNIT capsule Take 400 Units by mouth daily.    . Zinc 50 MG CAPS Take 1 capsule by mouth 2 (two) times a week.     No  current facility-administered medications for this visit.    Socially he is married and has 2 children and 4 grandchildren. There is no tobacco alcohol use. Recently he was has been very active.  ROS General: Negative; No fevers, chills, or night sweats;  HEENT: He is hard of hearing;no visual changes, sinus congestion, difficulty swallowing Pulmonary: Negative; No cough, wheezing, shortness of breath, hemoptysis Cardiovascular: Positive for aortic stenosis  No chest pain, presyncope, syncope, palpitations GI: Positive for recent GI bleed secondary to diverticular disease GU: Negative; No dysuria, hematuria, or difficulty voiding Musculoskeletal: Negative; no myalgias, joint pain, or weakness Hematologic/Oncology: Negative; no easy bruising, bleeding Endocrine: Negative; no heat/cold intolerance; no diabetes Neuro: Negative; no changes in balance, headaches Skin: Negative; No rashes or skin lesions Psychiatric: Negative; No behavioral problems, depression Sleep: Negative; No snoring, daytime sleepiness, hypersomnolence, bruxism, restless legs, hypnogognic hallucinations, no cataplexy Other comprehensive 14 point system review is negative.  PE BP 142/84 mmHg  Pulse 57  Ht 5' 10" (1.778 m)  Wt 205 lb 4 oz (93.101 kg)  BMI 29.45 kg/m2   Wt Readings from Last 3 Encounters:  12/26/14 205 lb 4 oz (93.101 kg)  10/02/14 208 lb 6.4 oz (94.53 kg)  09/24/14 201 lb 15.1 oz (91.6 kg)   General: Alert, oriented, no  distress.  Skin: normal turgor, no rashes HEENT: Normocephalic, atraumatic. Pupils round and reactive; sclera anicteric;no lid lag,  Nose without nasal septal hypertrophy Mouth/Parynx benign; Mallinpatti scale 2/3 Neck: No JVD, no carotid bruits Lungs: clear to ausculatation and percussion; no wheezing or rales Heart: RRR, s1 s2 normal 2/6 sem in the aortic region. No S3 gallop. No diastolic murmur. No rubs thrills or heaves. Chest wall has a mild pectus excavatum deformity  Abdomen: soft, nontender; no hepatosplenomehaly, BS+; abdominal aorta nontender and not dilated by palpation. He does have mild diastases recti Back: No CVA tenderness Pulses 2+ Extremities: Trace edema in the  lower extremities; no clubbing cyanosis, Homan's sign negative  Neurologic: grossly nonfocal; cranial nerves normal Psychological: Normal affect and mood  ECG (independently read by me): Sinus bradycardia 57 bpm.  Mild RV conduction delay.  May 2016 ECG (independently read by me): Sinus bradycardia at 49 bpm with first-degree AV block with a PR interval 218 ms.  Right reticular conduction delay.  December 2015 ECG (independently read by me).  For these: Sinus bradycardia 57 bpm.  First-degree AV block with a PR interval at 224 ms.  No significant ST-T changes.  March 2015 ECG (independently read by me): Sinus rhythm at 59 beats per minute. Mild LVH by voltage criteria in aVL. Normal intervals.  Prior 01/05/2013 ECG: Normal sinus rhythm at 62. Mild RV conduction delay. PR interval 204 ms, QTc interval 452 ms.  LABS: BMP Latest Ref Rng 12/26/2014 09/26/2014 09/25/2014  Glucose 65 - 99 mg/dL 91 97 92  BUN 7 - 25 mg/dL 27(H) 22(H) 32(H)  Creatinine 0.70 - 1.18 mg/dL 1.67(H) 1.48(H) 1.59(H)  Sodium 135 - 146 mmol/L 142 140 140  Potassium 3.5 - 5.3 mmol/L 4.8 4.0 4.3  Chloride 98 - 110 mmol/L 108 112(H) 112(H)  CO2 20 - 31 mmol/L 23 23 21(L)  Calcium 8.6 - 10.3 mg/dL 9.4 9.0 8.4(L)   Hepatic Function Latest Ref  Rng 09/24/2014 04/09/2014 07/12/2013  Total Protein 6.5 - 8.1 g/dL 5.8(L) 6.5 6.4  Albumin 3.5 - 5.0 g/dL 3.3(L) 4.0 4.1  AST 15 - 41 U/L 20 18 17  ALT 17 - 63 U/L 21 15 15    Alk Phosphatase 38 - 126 U/L 43 46 50  Total Bilirubin 0.3 - 1.2 mg/dL 0.5 0.4 0.6   CBC Latest Ref Rng 12/26/2014 09/26/2014 09/25/2014  WBC 4.0 - 10.5 K/uL 7.7 5.3 5.3  Hemoglobin 13.0 - 17.0 g/dL 13.0 10.1(L) 10.4(L)  Hematocrit 39.0 - 52.0 % 39.9 30.4(L) 32.2(L)  Platelets 150 - 400 K/uL 232 154 157   Lab Results  Component Value Date   MCV 90.1 12/26/2014   MCV 92.7 09/26/2014   MCV 92.8 09/25/2014   Lab Results  Component Value Date   TSH 3.007 04/09/2014  No results found for: HGBA1C  Lipid Panel     Component Value Date/Time   CHOL 137 04/09/2014 1115   CHOL 150 12/30/2012 0944   TRIG 182* 04/09/2014 1115   TRIG 151* 12/30/2012 0944   HDL 32* 04/09/2014 1115   HDL 34* 12/30/2012 0944   CHOLHDL 4.4 12/30/2012 0944   VLDL 30 12/30/2012 0944   LDLCALC 69 04/09/2014 1115   LDLCALC 86 12/30/2012 0944     RADIOLOGY: No results found.    ASSESSMENT AND PLAN: Jorge Cherry is a 78-year-old white male who is now 22 years status post CABG revascularization surgery.  He is not having any recurrent anginal symptoms and is status post DES stenting to the graft supplying the diagonal vessel 12 years ago.  His last echo Doppler study demonstrated mild aortic valve stenosis with normal systolic function.  His blood pressure today is stable on his current regimen consisting of diltiazem, metoprolol, micardis, torsemide, and low-dose spironolactone.  He does have mild renal insufficiency and on 10/12/2014.  His creatinine was 1.8 when checked by his primary physician, Dr. Avva, at [facility].  I will recheck his laboratory today.  If there is further increase in his level, I will discontinue his spironolactone.  (Lab came back after his office visit, and creatinine has slightly improved to 1.67)  He is  tolerating resumption of antiplatelets therapy with aspirin and Plavix and denies any further bleeding issues.  He is maintaining sinus rhythm without any further lower GI bleed due to his previous diverticular disease.  He is not having any recurrent anginal symptoms and is CAD appears to be fairly stable.  Recent lipid studies done on June 8 at [facility] showed excellent lipid panel with a total cholesterol of 104, triglycerides 113, LDL 49, although HDL was low at 32.  He will continue with his lipid lowering therapy.  I will see him in several months for follow-up evaluation.  Time spent: 25 minutes  Thomas A. Kelly, MD, FACC  12/28/2014 8:01 AM    

## 2015-01-01 ENCOUNTER — Encounter: Payer: Self-pay | Admitting: *Deleted

## 2015-01-16 ENCOUNTER — Encounter: Payer: Self-pay | Admitting: Cardiovascular Disease

## 2015-01-21 ENCOUNTER — Encounter (HOSPITAL_COMMUNITY): Payer: Self-pay | Admitting: Emergency Medicine

## 2015-01-21 DIAGNOSIS — Z7982 Long term (current) use of aspirin: Secondary | ICD-10-CM | POA: Diagnosis not present

## 2015-01-21 DIAGNOSIS — R0789 Other chest pain: Principal | ICD-10-CM | POA: Insufficient documentation

## 2015-01-21 DIAGNOSIS — Z79899 Other long term (current) drug therapy: Secondary | ICD-10-CM | POA: Insufficient documentation

## 2015-01-21 DIAGNOSIS — R079 Chest pain, unspecified: Secondary | ICD-10-CM | POA: Insufficient documentation

## 2015-01-21 DIAGNOSIS — E782 Mixed hyperlipidemia: Secondary | ICD-10-CM | POA: Diagnosis not present

## 2015-01-21 DIAGNOSIS — N183 Chronic kidney disease, stage 3 (moderate): Secondary | ICD-10-CM | POA: Diagnosis not present

## 2015-01-21 DIAGNOSIS — I129 Hypertensive chronic kidney disease with stage 1 through stage 4 chronic kidney disease, or unspecified chronic kidney disease: Secondary | ICD-10-CM | POA: Diagnosis not present

## 2015-01-21 DIAGNOSIS — I2510 Atherosclerotic heart disease of native coronary artery without angina pectoris: Secondary | ICD-10-CM | POA: Insufficient documentation

## 2015-01-21 DIAGNOSIS — Z951 Presence of aortocoronary bypass graft: Secondary | ICD-10-CM | POA: Diagnosis not present

## 2015-01-21 NOTE — ED Notes (Addendum)
Pt. reports central chest pain with mild SOB and diaphoresis onset this evening , no nausea , pt. took 3 NTG spray prior to arrival with no relief , rates pain 3/10 / non radiating . Hypertensive at arrival . His cardiologist is Dr. Tresa Endo , history of CAD /CABG and coronary stent.

## 2015-01-22 ENCOUNTER — Observation Stay (HOSPITAL_COMMUNITY): Payer: Medicare Other

## 2015-01-22 ENCOUNTER — Emergency Department (HOSPITAL_COMMUNITY): Payer: Medicare Other

## 2015-01-22 ENCOUNTER — Other Ambulatory Visit (HOSPITAL_COMMUNITY): Payer: Medicare Other

## 2015-01-22 ENCOUNTER — Telehealth: Payer: Self-pay | Admitting: Cardiovascular Disease

## 2015-01-22 ENCOUNTER — Observation Stay (HOSPITAL_COMMUNITY)
Admission: EM | Admit: 2015-01-22 | Discharge: 2015-01-22 | Disposition: A | Discharge status: Home / Self Care | Payer: Medicare Other | Attending: Internal Medicine | Admitting: Internal Medicine

## 2015-01-22 ENCOUNTER — Encounter (HOSPITAL_COMMUNITY): Payer: Self-pay | Admitting: Cardiology

## 2015-01-22 DIAGNOSIS — N189 Chronic kidney disease, unspecified: Secondary | ICD-10-CM | POA: Diagnosis not present

## 2015-01-22 DIAGNOSIS — I2510 Atherosclerotic heart disease of native coronary artery without angina pectoris: Secondary | ICD-10-CM | POA: Diagnosis not present

## 2015-01-22 DIAGNOSIS — R072 Precordial pain: Secondary | ICD-10-CM | POA: Diagnosis not present

## 2015-01-22 DIAGNOSIS — R079 Chest pain, unspecified: Secondary | ICD-10-CM | POA: Diagnosis not present

## 2015-01-22 DIAGNOSIS — Z951 Presence of aortocoronary bypass graft: Secondary | ICD-10-CM

## 2015-01-22 DIAGNOSIS — N183 Chronic kidney disease, stage 3 unspecified: Secondary | ICD-10-CM | POA: Diagnosis present

## 2015-01-22 DIAGNOSIS — E785 Hyperlipidemia, unspecified: Secondary | ICD-10-CM | POA: Diagnosis present

## 2015-01-22 HISTORY — DX: Atherosclerotic heart disease of native coronary artery without angina pectoris: I25.10

## 2015-01-22 HISTORY — DX: Essential (primary) hypertension: I10

## 2015-01-22 LAB — CBC
HEMATOCRIT: 41.5 % (ref 39.0–52.0)
Hemoglobin: 13.8 g/dL (ref 13.0–17.0)
MCH: 30.3 pg (ref 26.0–34.0)
MCHC: 33.3 g/dL (ref 30.0–36.0)
MCV: 91 fL (ref 78.0–100.0)
Platelets: 194 10*3/uL (ref 150–400)
RBC: 4.56 MIL/uL (ref 4.22–5.81)
RDW: 13.4 % (ref 11.5–15.5)
WBC: 6.8 10*3/uL (ref 4.0–10.5)

## 2015-01-22 LAB — BASIC METABOLIC PANEL
Anion gap: 6 (ref 5–15)
BUN: 20 mg/dL (ref 6–20)
CHLORIDE: 108 mmol/L (ref 101–111)
CO2: 23 mmol/L (ref 22–32)
Calcium: 9.3 mg/dL (ref 8.9–10.3)
Creatinine, Ser: 1.62 mg/dL — ABNORMAL HIGH (ref 0.61–1.24)
GFR calc Af Amer: 46 mL/min — ABNORMAL LOW (ref >60)
GFR calc non Af Amer: 40 mL/min — ABNORMAL LOW (ref >60)
Glucose, Bld: 112 mg/dL — ABNORMAL HIGH (ref 65–99)
POTASSIUM: 4.2 mmol/L (ref 3.5–5.1)
Sodium: 137 mmol/L (ref 135–145)

## 2015-01-22 LAB — I-STAT TROPONIN, ED: Troponin i, poc: 0.01 ng/mL (ref 0.00–0.08)

## 2015-01-22 LAB — NM MYOCAR MULTI W/SPECT W/WALL MOTION / EF
CHL CUP NUCLEAR SSS: 12
LV dias vol: 113 mL
LVSYSVOL: 51 mL
NUC STRESS TID: 1.33
RATE: 0.12
SDS: 5
SRS: 9

## 2015-01-22 LAB — PROTIME-INR
INR: 0.99 (ref 0.00–1.49)
Prothrombin Time: 13.3 seconds (ref 11.6–15.2)

## 2015-01-22 LAB — TROPONIN I: TROPONIN I: 0.52 ng/mL — AB (ref <0.031)

## 2015-01-22 IMAGING — DX DG CHEST 2V
2 series · 2 of 2 positions shown · non-contrast
Comparison: Radiograph dated [DATE]

CLINICAL DATA: 78-year-old male with chest pain and shortness of
breath

EXAM:
CHEST  2 VIEW

[chest pa]
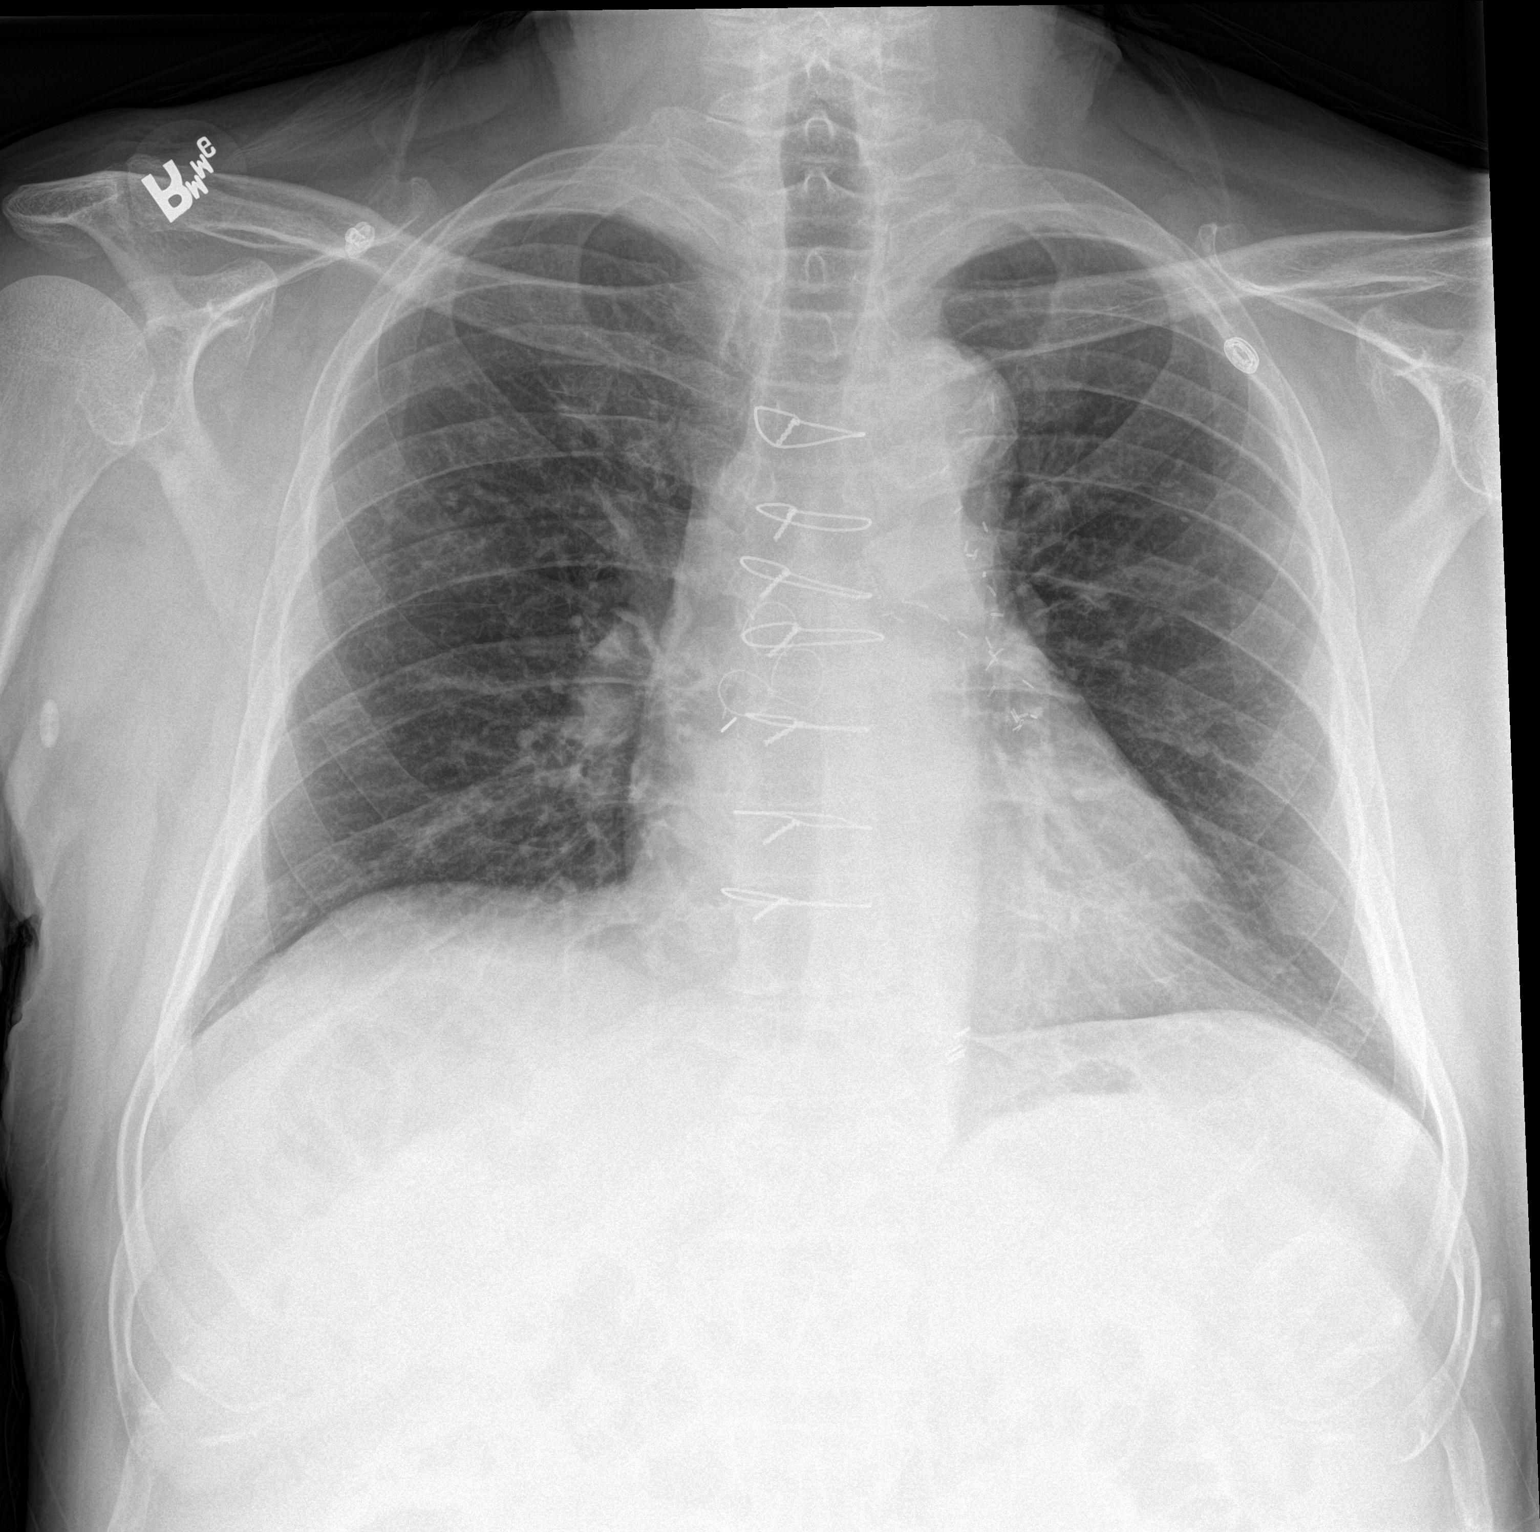

[chest lat]
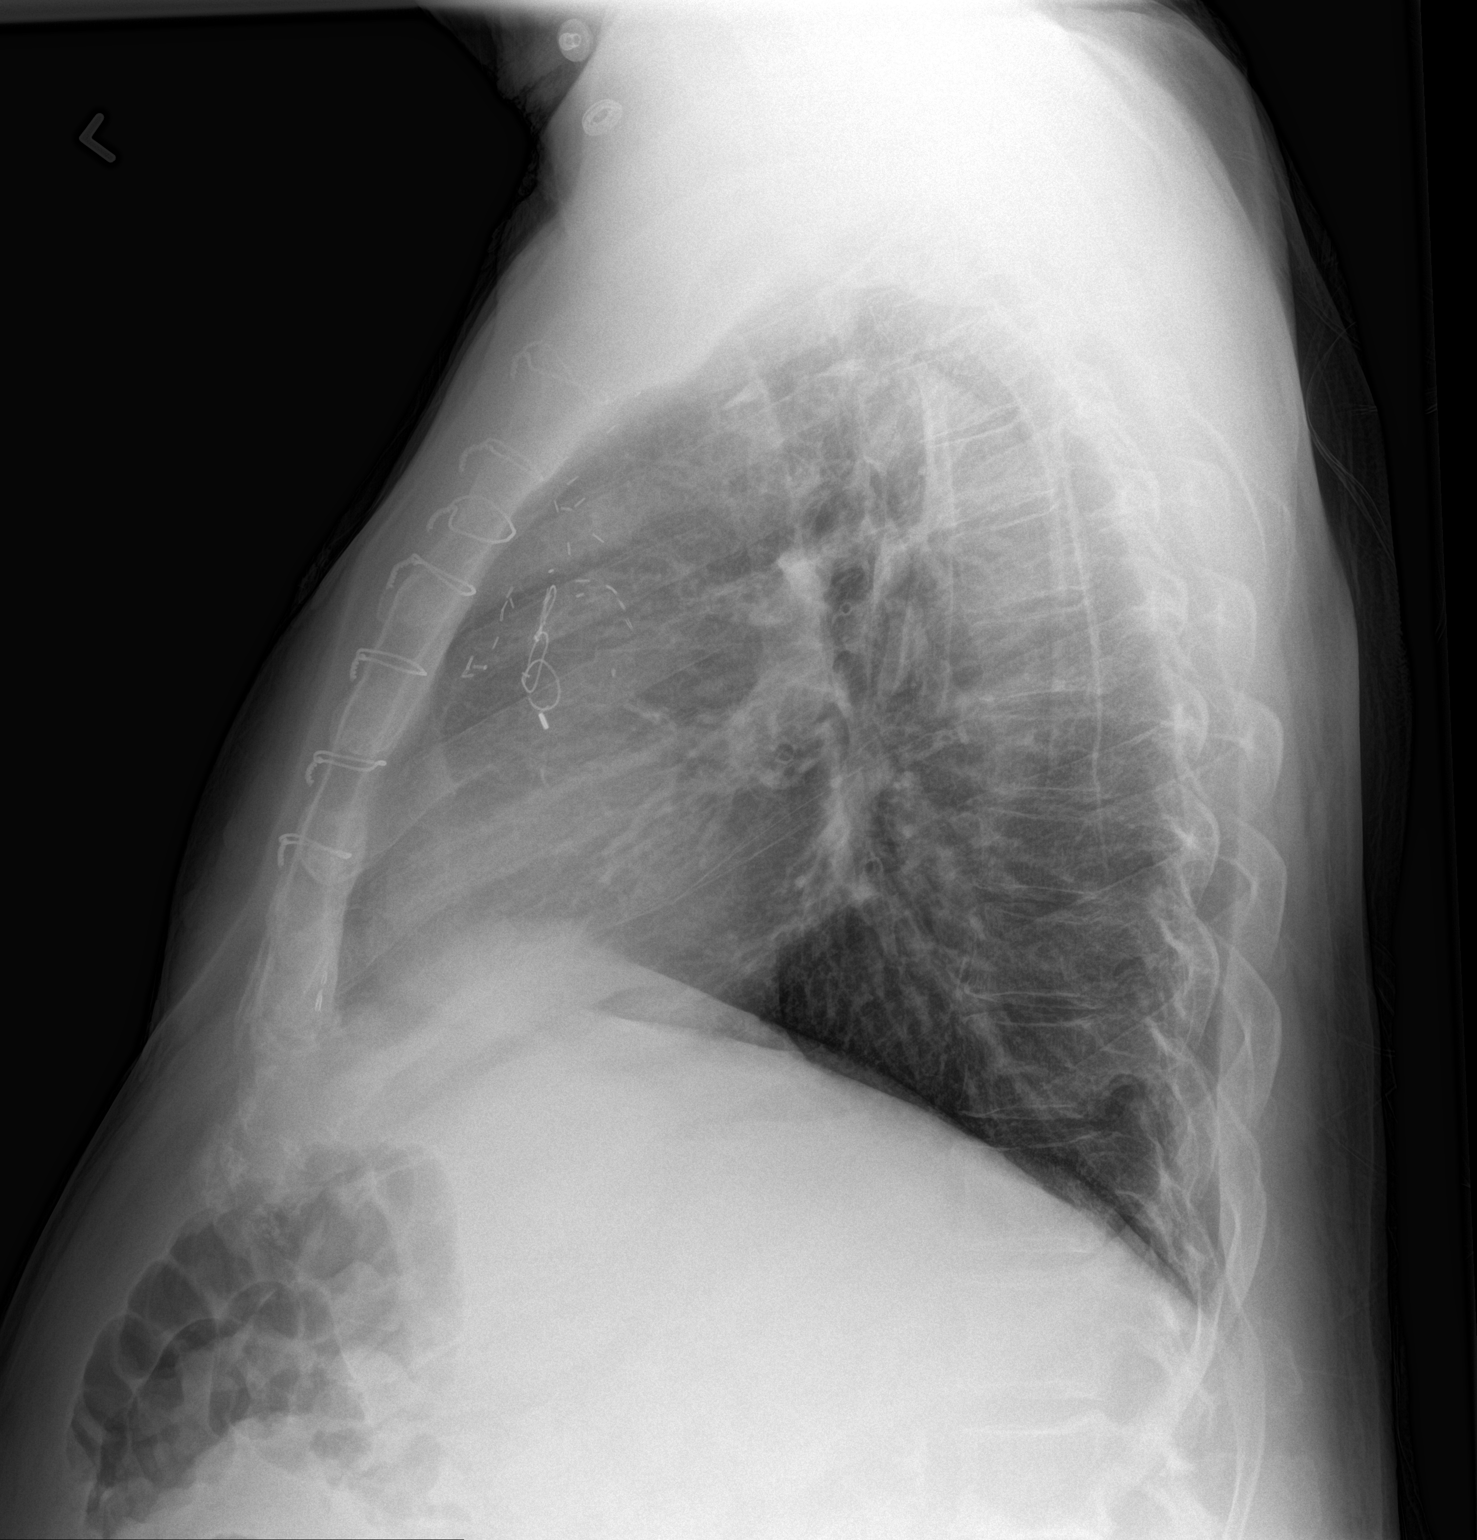

[2 of 2 positions shown; findings below may reference images not displayed]

FINDINGS: Median sternotomy wires and CABG clips noted. The heart size and
mediastinal contours are within normal limits. Both lungs are clear.
The visualized skeletal structures are unremarkable.
IMPRESSION: No active cardiopulmonary disease.

## 2015-01-22 MED ORDER — ASPIRIN 81 MG PO TABS: 81.0000 mg | ORAL_TABLET | Freq: Every day | ORAL | Status: DC

## 2015-01-22 MED ORDER — FENOFIBRATE 160 MG PO TABS
160.0000 mg | ORAL_TABLET | Freq: Every day | ORAL | Status: DC
  Administered 2015-01-22: 160 mg via ORAL
  Filled 2015-01-22: qty 1

## 2015-01-22 MED ORDER — AZELASTINE-FLUTICASONE 137-50 MCG/ACT NA SUSP: 1.0000 | Freq: Every day | NASAL | Status: DC

## 2015-01-22 MED ORDER — ACETAMINOPHEN 325 MG PO TABS: 650.0000 mg | ORAL_TABLET | ORAL | Status: DC | PRN

## 2015-01-22 MED ORDER — REGADENOSON 0.4 MG/5ML IV SOLN
INTRAVENOUS | Status: AC
  Administered 2015-01-22: 0.4 mg via INTRAVENOUS
  Filled 2015-01-22: qty 5

## 2015-01-22 MED ORDER — CLOPIDOGREL BISULFATE 75 MG PO TABS
75.0000 mg | ORAL_TABLET | Freq: Every day | ORAL | Status: DC
  Administered 2015-01-22: 75 mg via ORAL
  Filled 2015-01-22: qty 1

## 2015-01-22 MED ORDER — DILTIAZEM HCL ER COATED BEADS 240 MG PO CP24
240.0000 mg | ORAL_CAPSULE | Freq: Every day | ORAL | Status: DC
Start: 2015-01-22 — End: 2015-01-22
  Administered 2015-01-22: 240 mg via ORAL
  Filled 2015-01-22: qty 1

## 2015-01-22 MED ORDER — ASPIRIN 81 MG PO CHEW
324.0000 mg | CHEWABLE_TABLET | Freq: Once | ORAL | Status: AC
  Administered 2015-01-22: 324 mg via ORAL
  Filled 2015-01-22: qty 4

## 2015-01-22 MED ORDER — METOPROLOL SUCCINATE ER 100 MG PO TB24: 100.0000 mg | ORAL_TABLET | Freq: Every day | ORAL | Status: DC

## 2015-01-22 MED ORDER — ASPIRIN 81 MG PO CHEW
81.0000 mg | CHEWABLE_TABLET | Freq: Every day | ORAL | Status: DC
  Administered 2015-01-22: 81 mg via ORAL
  Filled 2015-01-22: qty 1

## 2015-01-22 MED ORDER — IRBESARTAN 300 MG PO TABS
300.0000 mg | ORAL_TABLET | Freq: Every day | ORAL | Status: DC
  Administered 2015-01-22: 300 mg via ORAL
  Filled 2015-01-22: qty 1

## 2015-01-22 MED ORDER — TECHNETIUM TC 99M SESTAMIBI GENERIC - CARDIOLITE
10.0000 | Freq: Once | INTRAVENOUS | Status: AC | PRN
  Administered 2015-01-22: 10 via INTRAVENOUS

## 2015-01-22 MED ORDER — SPIRONOLACTONE 25 MG PO TABS
12.5000 mg | ORAL_TABLET | Freq: Every day | ORAL | Status: DC
  Administered 2015-01-22: 12.5 mg via ORAL
  Filled 2015-01-22: qty 1

## 2015-01-22 MED ORDER — EZETIMIBE 10 MG PO TABS
10.0000 mg | ORAL_TABLET | Freq: Every day | ORAL | Status: DC
  Administered 2015-01-22: 10 mg via ORAL
  Filled 2015-01-22: qty 1

## 2015-01-22 MED ORDER — PANTOPRAZOLE SODIUM 40 MG PO TBEC
40.0000 mg | DELAYED_RELEASE_TABLET | Freq: Two times a day (BID) | ORAL | Status: DC
  Administered 2015-01-22: 40 mg via ORAL
  Filled 2015-01-22: qty 1

## 2015-01-22 MED ORDER — TORSEMIDE 20 MG PO TABS: 20.0000 mg | ORAL_TABLET | Freq: Two times a day (BID) | ORAL | Status: DC

## 2015-01-22 MED ORDER — TECHNETIUM TC 99M SESTAMIBI GENERIC - CARDIOLITE
30.0000 | Freq: Once | INTRAVENOUS | Status: AC | PRN
  Administered 2015-01-22: 30 via INTRAVENOUS

## 2015-01-22 MED ORDER — REGADENOSON 0.4 MG/5ML IV SOLN
0.4000 mg | Freq: Once | INTRAVENOUS | Status: AC
  Administered 2015-01-22: 0.4 mg via INTRAVENOUS
  Filled 2015-01-22: qty 5

## 2015-01-22 MED ORDER — ONDANSETRON HCL 4 MG/2ML IJ SOLN: 4.0000 mg | Freq: Four times a day (QID) | INTRAMUSCULAR | Status: DC | PRN

## 2015-01-22 MED ORDER — FLUTICASONE PROPIONATE 50 MCG/ACT NA SUSP
1.0000 | Freq: Every day | NASAL | Status: DC
  Filled 2015-01-22: qty 16

## 2015-01-22 MED ORDER — ATORVASTATIN CALCIUM 20 MG PO TABS: 20.0000 mg | ORAL_TABLET | Freq: Every day | ORAL | Status: DC

## 2015-01-22 NOTE — Progress Notes (Signed)
Lexiscan MV performed, 1 day study, [facility] to read.  Jorge Cherry 01/22/2015 12:54 PM Beeper [phone]

## 2015-01-22 NOTE — ED Provider Notes (Signed)
This chart was scribed for  Jorge Maw Hung Rhinesmith, DO by Bethel Born, ED Scribe. This patient was seen in room D31C/D31C and the patient's care was started at 3:33 AM.  TIME SEEN:3:33 AM   CHIEF COMPLAINT: Chest pain  HPI: Jorge Cherry is a 79 y.o. male with PMHx of CAD s/p CABG on Plavix, aortic stenosis, and HTN who presents to the Emergency Department complaining of constant and non-radiating central chest pain with onset around 8 PM yesterday while watching TV. He describes the pain as pressure. NTG spray provided no relief at home. The episode lasted until 30 minutes after arrival in the ED before resolving spontaneously. This pain was different than the burning pain that he has had with previous cardiac events. Associated symptoms include feeling clammy, short of breath. Pt denies fever, productive cough, SOB and nausea. He did not use aspirin at home. No history of DVT/PE. PCP: Dr. Virgel Manifold at [facility]. Last stress test was 2013 and showed a fixed defects.  ROS: See HPI Constitutional: no fever  Eyes: no drainage  ENT: no runny nose   Cardiovascular: chest pain  Resp: no SOB  GI: no vomiting GU: no dysuria Integumentary: no rash  Allergy: no hives  Musculoskeletal: no leg swelling  Neurological: no slurred speech ROS otherwise negative  PAST MEDICAL HISTORY/PAST SURGICAL HISTORY:  Past Medical History  Diagnosis Date  . Aortic valve disorder     2D ECHO, 10/19/2011 - EF >55%, LA mild-moderately dilated, mild-moderate tricuspid regurgitation, mild-moderate valvular aortic stenosis, moderate calcification of aortic valve leaflets  . DOE (dyspnea on exertion)     LEXISCAN, 11/05/2011 - Non-diagnositc for ischemia, post-stress EF 56%, small fixed apical inferior, apical lateral defect which may represent scar  . Atherosclerosis of renal artery     RENAL DOPPLER, 12/10/2011 - Left renal artery demonstrated narrowing with elevated velocities consistent with a 1-59% diameter reduction  . Coronary  artery disease   . Hypertension     MEDICATIONS:  Prior to Admission medications   Medication Sig Start Date End Date Taking? Authorizing Provider  aspirin 81 MG tablet Take 1 tablet (81 mg total) by mouth daily. 10/08/14  Yes Lonia Blood, MD  atorvastatin (LIPITOR) 40 MG tablet Take 20 mg by mouth daily.   Yes Historical Provider, MD  Cholecalciferol (VITAMIN D-3 PO) Take 750 Units by mouth daily.   Yes Historical Provider, MD  clopidogrel (PLAVIX) 75 MG tablet Take 1 tablet (75 mg total) by mouth daily. 10/02/14  Yes Lennette Bihari, MD  diltiazem (CARDIZEM CD) 240 MG 24 hr capsule TAKE 1 CAPSULE (240 MG TOTAL) BY MOUTH DAILY. 07/30/14  Yes Lennette Bihari, MD  DYMISTA 137-50 MCG/ACT SUSP Place 1 spray into both nostrils daily. 03/20/14  Yes Historical Provider, MD  ezetimibe (ZETIA) 10 MG tablet Take 1 tablet (10 mg total) by mouth daily. 04/12/14  Yes Lennette Bihari, MD  fenofibrate (TRICOR) 145 MG tablet Take 72.5 mg by mouth daily.    Yes Historical Provider, MD  fish oil-omega-3 fatty acids 1000 MG capsule Take 2 capsules (2 g total) by mouth 2 (two) times daily. 10/25/12  Yes Lennette Bihari, MD  loratadine (CLARITIN) 10 MG tablet Take 10 mg by mouth daily.   Yes Historical Provider, MD  metoprolol succinate (TOPROL-XL) 100 MG 24 hr tablet Take 1 tablet (100 mg total) by mouth daily. Take with or immediately following a meal. 04/12/14  Yes Lennette Bihari, MD  nitroGLYCERIN (NITROLINGUAL) 0.4 MG/SPRAY  spray Place 1 spray under the tongue every 5 (five) minutes x 3 doses as needed for chest pain. 10/03/14  Yes Lennette Bihari, MD  pantoprazole (PROTONIX) 40 MG tablet Take 40 mg by mouth 2 (two) times daily.    Yes Historical Provider, MD  PROAIR HFA 108 (90 BASE) MCG/ACT inhaler Inhale 1 puff into the lungs as needed for wheezing or shortness of breath.  09/04/12  Yes Historical Provider, MD  QVAR 80 MCG/ACT inhaler Inhale 2 puffs into the lungs daily. 03/05/14  Yes Historical Provider, MD   spironolactone (ALDACTONE) 25 MG tablet TAKE 0.5 TABLETS (12.5 MG TOTAL) BY MOUTH DAILY. 10/12/14  Yes Lennette Bihari, MD  telmisartan (MICARDIS) 80 MG tablet Take 80 mg by mouth daily.   Yes Historical Provider, MD  torsemide (DEMADEX) 20 MG tablet Take 1 tablet (20 mg total) by mouth 2 (two) times daily. 04/12/14  Yes Lennette Bihari, MD  vitamin C (ASCORBIC ACID) 500 MG tablet Take 500 mg by mouth daily.   Yes Historical Provider, MD  vitamin E 400 UNIT capsule Take 400 Units by mouth daily.   Yes Historical Provider, MD  Zinc 50 MG CAPS Take 1 capsule by mouth 2 (two) times a week.   Yes Historical Provider, MD    ALLERGIES:  Allergies  Allergen Reactions  . Contrast Media [Iodinated Diagnostic Agents]     SOCIAL HISTORY:  Social History  Substance Use Topics  . Smoking status: Never Smoker   . Smokeless tobacco: Not on file  . Alcohol Use: No    FAMILY HISTORY: Family History  Problem Relation Age of Onset  . Cancer Mother   . Heart disease Father   . Stroke Maternal Grandmother   . Cancer Maternal Grandfather     EXAM: BP 192/87 mmHg  Pulse 62  Temp(Src) 97.8 F (36.6 C) (Oral)  Resp 16  SpO2 96% CONSTITUTIONAL: Alert and oriented and responds appropriately to questions. Well-appearing; well-nourished HEAD: Normocephalic EYES: Conjunctivae clear, PERRL ENT: normal nose; no rhinorrhea; moist mucous membranes; pharynx without lesions noted NECK: Supple, no meningismus, no LAD  CARD: RRR; S1 and S2 appreciated; systolic murmur, no clicks, no rubs, no gallops RESP: Normal chest excursion without splinting or tachypnea; breath sounds clear and equal bilaterally; no wheezes, no rhonchi, no rales, no hypoxia or respiratory distress, speaking full sentences ABD/GI: Normal bowel sounds; non-distended; soft, non-tender, no rebound, no guarding, no peritoneal signs BACK:  The back appears normal and is non-tender to palpation, there is no CVA tenderness EXT: Normal ROM in all  joints; non-tender to palpation; no edema; normal capillary refill; no cyanosis, no calf tenderness or swelling    SKIN: Normal color for age and race; warm NEURO: Moves all extremities equally, sensation to light touch intact diffusely, cranial nerves II through XII intact PSYCH: The patient's mood and manner are appropriate. Grooming and personal hygiene are appropriate.  MEDICAL DECISION MAKING: Patient here with multiple risk factors for ACS and a prior history of cardiac disease who had an episode of chest pain, shortness of breath and diaphoresis that has now resolved. Currently hemodynamically stable. EKG shows possible new ST depression in lead 1 and aVL compared to prior. First troponin is negative. A chest x-ray clear. We'll give aspirin and admit for chest pain rule out. Doubt pulmonary unless our dissection given pain is now gone.  ED PROGRESS: 4:50 AM  D/w Dr. Maryfrances Bunnell with hospitalist service for admission to telemetry, observation. Patient still chest pain-free.  EKG Interpretation  Date/Time:  Monday January 21 2015 23:46:51 EDT Ventricular Rate:  62 PR Interval:  178 QRS Duration: 122 QT Interval:  470 QTC Calculation: 477 R Axis:   -30 Text Interpretation:  Normal sinus rhythm Left axis deviation RSR' or QR pattern in V1 suggests right ventricular conduction delay Left ventricular hypertrophy with QRS widening and repolarization abnormality Abnormal ECG ST depression in lateral leads that is new compared to previous Confirmed by Toryn Mcclinton,  DO, Omaree Fuqua ([id number]) on 01/22/2015 3:21:10 AM         I personally performed the services described in this documentation, which was scribed in my presence. The recorded information has been reviewed and is accurate.      Jorge Maw Narissa Beaufort, DO 01/22/15 [phone]

## 2015-01-22 NOTE — Telephone Encounter (Signed)
Please give Dr Tresa Endo this message.Pt's wife called and said the doctor at the hospital told her to notify Dr Collie Siad have been admitted to [facility].

## 2015-01-22 NOTE — Consult Note (Signed)
CARDIOLOGY CONSULT NOTE  Patient ID: Jorge Cherry MRN: [id number] DOB/AGE: 1935/12/05 79 y.o.  Admit date: 01/22/2015 Primary Physician Hoyle Sauer, MD  Primary Cardiologist Dr. Daphene Jaeger Chief Complaint  Chest pain  HPI:  The patient has a history CAD dating back to 1994 at which time he underwent CABG revascularization surgery. In October 2003 he underwent stenting to the proximal portion of the vein graft supplying the diagonal vessel with a 3.5x16 mm Taxus DES stent post dilated to 4.0 mm. He has diffuse disease in the distal apical portion of the LAD beyond the LIMA insertion treated medically. He has documented mild aortic valve stenosis with grade 2 diastolic dysfunction with concentric left ventricular hypertrophy. He has documented renal cysts, history of hypertension, mixed hyperlipidemia. His last Myoview study was in June 2013 which showed a minimal apical defect. Post-stess ejection fraction was 56%.  The patient had chest pain last night.  This was 5/10 and sharp and felt like previous reflux. He felt a burning that radiated from his midepigastric area up to his throat. He did take 3 nitroglycerin sprays without improvement. However, by the time he presented to the emergency room his discomfort was gone. He's had none since then. He had no radiation to his jaw or to his arms. He had some mild diaphoresis but no nausea or vomiting. This was not like the discomfort that he had prior to his bypass or stents. He is otherwise somewhat active in his yard on limited by back pain. He's had no recent symptoms. Denies any shortness of breath, PND or orthopnea. He's had no palpitations, presyncope or syncope.  In the emergency room enzymes 1 negative and his EKG demonstrates no acute abnormalities.   Past Medical History  Diagnosis Date  . Aortic valve disorder     2D ECHO, 10/19/2011 - EF >55%, LA mild-moderately dilated, mild-moderate tricuspid regurgitation, mild-moderate valvular  aortic stenosis, moderate calcification of aortic valve leaflets  . DOE (dyspnea on exertion)     LEXISCAN, 11/05/2011 - Non-diagnositc for ischemia, post-stress EF 56%, small fixed apical inferior, apical lateral defect which may represent scar  . Atherosclerosis of renal artery     RENAL DOPPLER, 12/10/2011 - Left renal artery demonstrated narrowing with elevated velocities consistent with a 1-59% diameter reduction  . Coronary artery disease   . Hypertension     Past Surgical History  Procedure Laterality Date  . Cardiac catheterization  02/27/2004    Coronary intervention and medical management  . Cardiac catheterization  03/04/2004    SVG supplying the diagonal vessel stented with a 3.5x38mm Taxus stent post dilated to 4.0 mm  . Coronary artery bypass graft    . Coronary stent placement      Allergies  Allergen Reactions  . Contrast Media [Iodinated Diagnostic Agents]    Prescriptions prior to admission  Medication Sig Dispense Refill Last Dose  . aspirin 81 MG tablet Take 1 tablet (81 mg total) by mouth daily. 30 tablet  01/20/2015 at Unknown time  . atorvastatin (LIPITOR) 40 MG tablet Take 20 mg by mouth daily.   01/20/2015 at Unknown time  . Cholecalciferol (VITAMIN D-3 PO) Take 750 Units by mouth daily.   01/20/2015 at Unknown time  . clopidogrel (PLAVIX) 75 MG tablet Take 1 tablet (75 mg total) by mouth daily. 30 tablet 6 01/20/2015 at Unknown time  . diltiazem (CARDIZEM CD) 240 MG 24 hr capsule TAKE 1 CAPSULE (240 MG TOTAL) BY MOUTH DAILY. 30 capsule 6 01/21/2015  at Unknown time  . DYMISTA 137-50 MCG/ACT SUSP Place 1 spray into both nostrils daily.   01/21/2015 at Unknown time  . ezetimibe (ZETIA) 10 MG tablet Take 1 tablet (10 mg total) by mouth daily. 90 tablet 3 01/21/2015 at Unknown time  . fenofibrate (TRICOR) 145 MG tablet Take 72.5 mg by mouth daily.    01/21/2015 at Unknown time  . fish oil-omega-3 fatty acids 1000 MG capsule Take 2 capsules (2 g total) by mouth 2 (two) times  daily. 120 capsule 11 01/20/2015 at Unknown time  . loratadine (CLARITIN) 10 MG tablet Take 10 mg by mouth daily.   01/21/2015 at Unknown time  . metoprolol succinate (TOPROL-XL) 100 MG 24 hr tablet Take 1 tablet (100 mg total) by mouth daily. Take with or immediately following a meal. 90 tablet 3 01/21/2015 at 0800  . nitroGLYCERIN (NITROLINGUAL) 0.4 MG/SPRAY spray Place 1 spray under the tongue every 5 (five) minutes x 3 doses as needed for chest pain. 12 g 2 01/21/2015 at Unknown time  . pantoprazole (PROTONIX) 40 MG tablet Take 40 mg by mouth 2 (two) times daily.    01/20/2015 at Unknown time  . PROAIR HFA 108 (90 BASE) MCG/ACT inhaler Inhale 1 puff into the lungs as needed for wheezing or shortness of breath.    01/20/2015 at Unknown time  . QVAR 80 MCG/ACT inhaler Inhale 2 puffs into the lungs daily.  1 01/20/2015 at Unknown time  . spironolactone (ALDACTONE) 25 MG tablet TAKE 0.5 TABLETS (12.5 MG TOTAL) BY MOUTH DAILY. 15 tablet 7 01/20/2015 at Unknown time  . telmisartan (MICARDIS) 80 MG tablet Take 80 mg by mouth daily.   01/21/2015 at Unknown time  . torsemide (DEMADEX) 20 MG tablet Take 1 tablet (20 mg total) by mouth 2 (two) times daily. 180 tablet 3 01/21/2015 at Unknown time  . vitamin C (ASCORBIC ACID) 500 MG tablet Take 500 mg by mouth daily.   01/20/2015 at Unknown time  . vitamin E 400 UNIT capsule Take 400 Units by mouth daily.   01/20/2015 at Unknown time  . Zinc 50 MG CAPS Take 1 capsule by mouth 2 (two) times a week.   01/20/2015 at Unknown time   Family History  Problem Relation Age of Onset  . Cancer Mother   . Heart disease Father   . Stroke Maternal Grandmother   . Cancer Maternal Grandfather     Social History   Social History  . Marital Status: Married    Spouse Name: N/A  . Number of Children: N/A  . Years of Education: N/A   Occupational History  . Not on file.   Social History Main Topics  . Smoking status: Never Smoker   . Smokeless tobacco: Not on file  .  Alcohol Use: No  . Drug Use: No  . Sexual Activity: Not on file   Other Topics Concern  . Not on file   Social History Narrative     ROS:  Back pain.  Otherwise, as stated in the HPI and negative for all other systems.  Physical Exam: Blood pressure 140/74, pulse 50, temperature 98 F (36.7 C), temperature source Oral, resp. rate 18, height 5\' 10"  (1.778 m), weight 203 lb 11.2 oz (92.398 kg), SpO2 94 %.  GENERAL:  Well appearing HEENT:  Pupils equal round and reactive, fundi not visualized, oral mucosa unremarkable NECK:  No jugular venous distention, waveform within normal limits, carotid upstroke brisk and symmetric, no bruits, no thyromegaly LYMPHATICS:  No cervical, inguinal adenopathy LUNGS:  Clear to auscultation bilaterally BACK:  No CVA tenderness CHEST:  Unremarkable HEART:  PMI not displaced or sustained,S1 and S2 within normal limits, no S3, no S4, no clicks, no rubs, 3 out of 6 apical early peaking systolic murmur radiating out the aortic outflow tract and into the right carotid, no change with Valsalva, no diastolic murmurs ABD:  Flat, positive bowel sounds normal in frequency in pitch, no bruits, no rebound, no guarding, no midline pulsatile mass, no hepatomegaly, no splenomegaly EXT:  2 plus pulses throughout, no edema, no cyanosis no clubbing SKIN:  No rashes no nodules NEURO:  Cranial nerves II through XII grossly intact, motor grossly intact throughout PSYCH:  Cognitively intact, oriented to person place and time   Labs: Lab Results  Component Value Date   BUN 20 01/22/2015   Lab Results  Component Value Date   CREATININE 1.62* 01/22/2015   Lab Results  Component Value Date   NA 137 01/22/2015   K 4.2 01/22/2015   CL 108 01/22/2015   CO2 23 01/22/2015   No results found for: TROPONINI Lab Results  Component Value Date   WBC 6.8 01/22/2015   HGB 13.8 01/22/2015   HCT 41.5 01/22/2015   MCV 91.0 01/22/2015   PLT 194 01/22/2015    Lab Results    Component Value Date   ALT 21 09/24/2014   AST 20 09/24/2014   ALKPHOS 43 09/24/2014   BILITOT 0.5 09/24/2014      Radiology:   CXR: FINDINGS: Median sternotomy wires and CABG clips noted. The heart size and mediastinal contours are within normal limits. Both lungs are clear. The visualized skeletal structures are unremarkable.  EKG:NSR, rate 58, leftward axis, QTc prolonged.  No acute ST T wave changes.  01/22/2015   ASSESSMENT AND PLAN:   CHEST PAIN:  The pain is somewhat atypical. However, he has known vascular disease. He needs stress testing. He would not be a walk on a treadmill. Therefore, he will have a  [employer].  HTN:  His blood pressure is elevated.  However, he has had extensive work up and management of this.  His BP is usually controlled in the AM and higher in the evening.  For now he can continue on the meds as listed and follow with Dr. Felipa Eth and Dr. Tresa Endo.  CKD:  This is his baseline.    AS:  This was mild. Echo was scheduled the office today. This was canceled. This can be rescheduled at a later date.  SignedRollene Rotunda 01/22/2015, 10:00 AM

## 2015-01-22 NOTE — H&P (Signed)
History and Physical  Jorge Cherry:[id number] DOB: [birth year] DOA: 01/22/2015  Referring physician: Rochele Raring, MD PCP: Jorge Sauer, MD   Chief Complaint: "I started to have chest pain tonight, now I think it was gas."  HPI: Jorge Cherry is a 79 y.o. male with a past medical history significant for coronary artery disease s/p CABG in 1994 and PCI in 2003, hyperlipidemia, hypertension, and CKD stage III who presents with new chest pain.  The patient was in his usual state of health until last evening at 8 pm when he noted a chest pressure while sitting.  The pain was a pressure, that radiated up from the center chest up the throat.  It was not worsened with exertion, nor relieved with nitroglycerin at home.  He noted some "cold sweats" at one point, and then asked his wife to drive him to the ER.  In the ED, he had normal troponin, EKG that showed down-sloping slight ST depression in aVL, V5 and V6.  His discomfort resolved spontaneously in the ED.   Review of Systems:  Patient seen 0400. Pt complains of now resolved chest discomfort, cold sweats.  Also endorses heartburn and bloating.  Pt denies any emesis, palpitations, leg swelling, radiation to jaw or arms.  Otherwise twelve systems were reviewed and were negative except as noted above in the history of present illness.  Past Medical History  Diagnosis Date  . Aortic valve disorder     2D ECHO, 10/19/2011 - EF >55%, LA mild-moderately dilated, mild-moderate tricuspid regurgitation, mild-moderate valvular aortic stenosis, moderate calcification of aortic valve leaflets  . DOE (dyspnea on exertion)     LEXISCAN, 11/05/2011 - Non-diagnositc for ischemia, post-stress EF 56%, small fixed apical inferior, apical lateral defect which may represent scar  . Atherosclerosis of renal artery     RENAL DOPPLER, 12/10/2011 - Left renal artery demonstrated narrowing with elevated velocities consistent with a 1-59% diameter reduction  .  Coronary artery disease   . Hypertension    Past Surgical History  Procedure Laterality Date  . Cardiac catheterization  02/27/2004    Coronary intervention and medical management  . Cardiac catheterization  03/04/2004    SVG supplying the diagonal vessel stented with a 3.5x59mm Taxus stent post dilated to 4.0 mm  . Coronary artery bypass graft    . Coronary stent placement     Social History:  reports that he has never smoked. He does not have any smokeless tobacco history on file. He reports that he does not drink alcohol or use illicit drugs. Patient lives at home with wife in [place].  He is retired from [employer], dispensing work.  He is independent with all IADLs and ambulates independently.  He is a non-smoker.  Allergies  Allergen Reactions  . Contrast Media [Iodinated Diagnostic Agents]     Family History  Problem Relation Age of Onset  . Cancer Mother   . Heart disease Father   . Stroke Maternal Grandmother   . Cancer Maternal Grandfather      Prior to Admission medications   Medication Sig Start Date End Date Taking? Authorizing Provider  aspirin 81 MG tablet Take 1 tablet (81 mg total) by mouth daily. 10/08/14  Yes Lonia Blood, MD  atorvastatin (LIPITOR) 40 MG tablet Take 20 mg by mouth daily.   Yes Historical Provider, MD  Cholecalciferol (VITAMIN D-3 PO) Take 750 Units by mouth daily.   Yes Historical Provider, MD  clopidogrel (PLAVIX) 75 MG tablet Take 1  tablet (75 mg total) by mouth daily. 10/02/14  Yes Lennette Bihari, MD  diltiazem (CARDIZEM CD) 240 MG 24 hr capsule TAKE 1 CAPSULE (240 MG TOTAL) BY MOUTH DAILY. 07/30/14  Yes Lennette Bihari, MD  DYMISTA 137-50 MCG/ACT SUSP Place 1 spray into both nostrils daily. 03/20/14  Yes Historical Provider, MD  ezetimibe (ZETIA) 10 MG tablet Take 1 tablet (10 mg total) by mouth daily. 04/12/14  Yes Lennette Bihari, MD  fenofibrate (TRICOR) 145 MG tablet Take 72.5 mg by mouth daily.    Yes Historical Provider, MD  fish oil-omega-3  fatty acids 1000 MG capsule Take 2 capsules (2 g total) by mouth 2 (two) times daily. 10/25/12  Yes Lennette Bihari, MD  loratadine (CLARITIN) 10 MG tablet Take 10 mg by mouth daily.   Yes Historical Provider, MD  metoprolol succinate (TOPROL-XL) 100 MG 24 hr tablet Take 1 tablet (100 mg total) by mouth daily. Take with or immediately following a meal. 04/12/14  Yes Lennette Bihari, MD  nitroGLYCERIN (NITROLINGUAL) 0.4 MG/SPRAY spray Place 1 spray under the tongue every 5 (five) minutes x 3 doses as needed for chest pain. 10/03/14  Yes Lennette Bihari, MD  pantoprazole (PROTONIX) 40 MG tablet Take 40 mg by mouth 2 (two) times daily.    Yes Historical Provider, MD  PROAIR HFA 108 (90 BASE) MCG/ACT inhaler Inhale 1 puff into the lungs as needed for wheezing or shortness of breath.  09/04/12  Yes Historical Provider, MD  QVAR 80 MCG/ACT inhaler Inhale 2 puffs into the lungs daily. 03/05/14  Yes Historical Provider, MD  spironolactone (ALDACTONE) 25 MG tablet TAKE 0.5 TABLETS (12.5 MG TOTAL) BY MOUTH DAILY. 10/12/14  Yes Lennette Bihari, MD  telmisartan (MICARDIS) 80 MG tablet Take 80 mg by mouth daily.   Yes Historical Provider, MD  torsemide (DEMADEX) 20 MG tablet Take 1 tablet (20 mg total) by mouth 2 (two) times daily. 04/12/14  Yes Lennette Bihari, MD  vitamin C (ASCORBIC ACID) 500 MG tablet Take 500 mg by mouth daily.   Yes Historical Provider, MD  vitamin E 400 UNIT capsule Take 400 Units by mouth daily.   Yes Historical Provider, MD  Zinc 50 MG CAPS Take 1 capsule by mouth 2 (two) times a week.   Yes Historical Provider, MD    Physical Exam: BP 166/85 mmHg  Pulse 55  Temp(Src) 97.8 F (36.6 C) (Oral)  Resp 17  SpO2 97% General: Elderly male, alert and in no distress.  Responds appropriately to questions.  Eye contact, dress and hygiene appropriate. HEENT: Corneas clear, conjunctivae and sclerae normal without injection or icterus, lids and lashes normal.  Visual tracking smooth.  OP moist without  erythema, exudates, cobblestoning, or ulcers.  No airway deformities.  Neck supple.   Lymph: No cervical or axillary lymphadenopathy. Chest: No pain with precordial pressure or chest wall tenderness. Cardiac: RRR, nl S1-S2, loud systolic crescendo-decrescendo murmur, best appreciated at LUSB.  Capillary refill is less than 2 seconds.   Respiratory: Normal respiratory rate and rhythm.  CTAB without rales or wheezes. Abdomen: BS present.  Round abdomen, soft, without TTP or rebound or guarding.  Extremities: No deformities/injuries.  5/5 grip strength and upper extremity flexion/extension, symmetrically.  Extremities are warm and well-perfused. Neuro: Sensorium intact.  Cranial nerves intact.  Speech is fluent.  Naming is grossly intact, and the patient's recall, recent and remote, as well as general fund of knowledge seem within normal limits.  Muscle  tone normal, without fasciculations.  Moves all extremities equally and with normal coordination.  Attention span and concentration are within normal limits.           Labs on Admission:  Basic Metabolic Panel:  Recent Labs Lab 01/22/15 0005  NA 137  K 4.2  CL 108  CO2 23  GLUCOSE 112*  BUN 20  CREATININE 1.62*  CALCIUM 9.3   Liver Function Tests: No results for input(s): AST, ALT, ALKPHOS, BILITOT, PROT, ALBUMIN in the last 168 hours. No results for input(s): LIPASE, AMYLASE in the last 168 hours. No results for input(s): AMMONIA in the last 168 hours. CBC:  Recent Labs Lab 01/22/15 0005  WBC 6.8  HGB 13.8  HCT 41.5  MCV 91.0  PLT 194   Cardiac Enzymes: No results for input(s): CKTOTAL, CKMB, CKMBINDEX, TROPONINI in the last 168 hours.  BNP (last 3 results) No results for input(s): BNP in the last 8760 hours.  ProBNP (last 3 results) No results for input(s): PROBNP in the last 8760 hours.  CBG: No results for input(s): GLUCAP in the last 168 hours.  Radiological Exams on Admission: Dg Chest 2 View  01/22/2015    CLINICAL DATA:  79 year old male with chest pain and shortness of breath  EXAM: CHEST  2 VIEW  COMPARISON:  Radiograph dated 04/19/2006  FINDINGS: Median sternotomy wires and CABG clips noted. The heart size and mediastinal contours are within normal limits. Both lungs are clear. The visualized skeletal structures are unremarkable.  IMPRESSION: No active cardiopulmonary disease.   Electronically Signed   By: Elgie Collard M.D.   On: 01/22/2015 01:02    EKG: Independently reviewed. Minimal down-sloping ST depression in I, aVL, V5 and maybe V6.  New from May 2016  Assessment/Plan Present on Admission:  . Chest pain . Hyperlipidemia with target LDL less than 70 . Coronary atherosclerosis of native coronary artery . Chronic renal insufficiency, stage III (moderate)   1. Chest pain: Atypical chest pain in patient with high risk for progression of ASCVD.   Serial troponin times 3 NPO at 4am  NM SPECT with chemical stress in AM  2. History of coronary disease: Rule out ACS as above  3. Chronic medical issues: Continue home Dymista and pantoprazole. Continue home atorvastatin, aspirin 81 mg, clopidogrel, ezetimibe, fenofibrate, irbesartan, metoprolol, and diltiazem.   Continue home spironolactone and torsemide.   Code Status: Full  Family Communication: Wife present at bedside   Disposition Plan:  The patient will be admitted for observation, serial troponins and expedited cardiac stress testing.  If stress test normal, anticipate discharge tonight.   Alberteen Sam [facility] Pager [phone]

## 2015-01-24 NOTE — Telephone Encounter (Signed)
Dr. Tresa Endo please review. FYI

## 2015-01-27 NOTE — Discharge Summary (Addendum)
Physician Discharge Summary  Jorge Cherry:[id number] DOB: 28-Aug-1935 DOA: 01/22/2015  PCP: Hoyle Sauer, MD  Admit date: 01/22/2015 Discharge date: 01/22/2015  Time spent: 45 minutes  Recommendations for Outpatient Follow-up:  1. PCP in 1 week 2. Dr.Kelly in 2 weeks, needs ECHO rescheduled  Discharge Diagnoses:  Principal Problem:   Chest pain Active Problems:   Hyperlipidemia with target LDL less than 70   Hx of CABG   Coronary atherosclerosis of native coronary artery   Chronic renal insufficiency, stage III (moderate)   Discharge Condition: stable  Diet recommendation: heart healthy  Filed Weights   01/22/15 0601  Weight: 92.398 kg (203 lb 11.2 oz)    History of present illness:  The patient has a history CAD dating back to 1994 at which time he underwent CABG revascularization surgery. In October 2003 he underwent stenting to the proximal portion of the vein graft supplying the diagonal vessel with a 3.5x16 mm Taxus DES stent. He has diffuse disease in the distal apical portion of the LAD beyond the LIMA insertion treated medically. He has documented mild aortic valve stenosis with grade 2 diastolic dysfunction with concentric left ventricular hypertrophy. He has documented renal cysts, history of hypertension, mixed hyperlipidemia. His last Myoview study was in June 2013 which showed a minimal apical defect. Post-stess ejection fraction was 56%. The patient had chest pain last night. This was 5/10 and sharp and felt like previous reflux. He felt a burning that radiated from his midepigastric area up to his throat. He did take 3 nitroglycerin sprays without improvement. However, by the time he presented to the emergency room his discomfort was gone.  Hospital Course:  CHEST PAIN: -Atypical but with known CAD, troponins were negative, seen by Cardiology, underwent a Lexiscan Myoview. This was read as low risk and no further workup was recommended per Cardiology at  this time. -He is advised to continue his ASA/Toprol/statin and FU with Dr.Kelly in 2 weeks  HTN:  - continue on the meds as listed and follow with Dr. Felipa Eth and Dr. Tresa Endo.  CKD:  -stable   AS: - Mild, Echo was scheduled the office today, got canceled. Per Cardiology this can be rescheduled at a later date.   Procedures:  Myoview 9/13: Low risk  Consultations:  Cardiology  Discharge Exam: Filed Vitals:   01/22/15 1410  BP: 169/90  Pulse: 58  Temp: 98 F (36.7 C)  Resp: 20    General: AAOx3 Cardiovascular: S1S2/RRR Respiratory: CTAB  Discharge Instructions   Discharge Instructions    Diet - low sodium heart healthy    Complete by:  As directed      Increase activity slowly    Complete by:  As directed           Discharge Medication List as of 01/22/2015  5:20 PM    CONTINUE these medications which have NOT CHANGED   Details  aspirin 81 MG tablet Take 1 tablet (81 mg total) by mouth daily., Starting 10/08/2014, Until Discontinued, No Print    atorvastatin (LIPITOR) 40 MG tablet Take 20 mg by mouth daily., Until Discontinued, Historical Med    Cholecalciferol (VITAMIN D-3 PO) Take 750 Units by mouth daily., Until Discontinued, Historical Med    clopidogrel (PLAVIX) 75 MG tablet Take 1 tablet (75 mg total) by mouth daily., Starting 10/02/2014, Until Discontinued, Normal    diltiazem (CARDIZEM CD) 240 MG 24 hr capsule TAKE 1 CAPSULE (240 MG TOTAL) BY MOUTH DAILY., Normal    DYMISTA  137-50 MCG/ACT SUSP Place 1 spray into both nostrils daily., Starting 03/20/2014, Until Discontinued, Historical Med    ezetimibe (ZETIA) 10 MG tablet Take 1 tablet (10 mg total) by mouth daily., Starting 04/12/2014, Until Discontinued, Normal    fenofibrate (TRICOR) 145 MG tablet Take 72.5 mg by mouth daily. , Until Discontinued, Historical Med    fish oil-omega-3 fatty acids 1000 MG capsule Take 2 capsules (2 g total) by mouth 2 (two) times daily., Starting 10/25/2012, Until  Discontinued, Print    loratadine (CLARITIN) 10 MG tablet Take 10 mg by mouth daily., Until Discontinued, Historical Med    metoprolol succinate (TOPROL-XL) 100 MG 24 hr tablet Take 1 tablet (100 mg total) by mouth daily. Take with or immediately following a meal., Starting 04/12/2014, Until Discontinued, Normal    nitroGLYCERIN (NITROLINGUAL) 0.4 MG/SPRAY spray Place 1 spray under the tongue every 5 (five) minutes x 3 doses as needed for chest pain., Starting 10/03/2014, Until Discontinued, Normal    pantoprazole (PROTONIX) 40 MG tablet Take 40 mg by mouth 2 (two) times daily. , Until Discontinued, Historical Med    PROAIR HFA 108 (90 BASE) MCG/ACT inhaler Inhale 1 puff into the lungs as needed for wheezing or shortness of breath. , Starting 09/04/2012, Until Discontinued, Historical Med    QVAR 80 MCG/ACT inhaler Inhale 2 puffs into the lungs daily., Starting 03/05/2014, Until Discontinued, Historical Med    spironolactone (ALDACTONE) 25 MG tablet TAKE 0.5 TABLETS (12.5 MG TOTAL) BY MOUTH DAILY., Normal    telmisartan (MICARDIS) 80 MG tablet Take 80 mg by mouth daily., Until Discontinued, Historical Med    torsemide (DEMADEX) 20 MG tablet Take 1 tablet (20 mg total) by mouth 2 (two) times daily., Starting 04/12/2014, Until Discontinued, Normal    vitamin C (ASCORBIC ACID) 500 MG tablet Take 500 mg by mouth daily., Until Discontinued, Historical Med    vitamin E 400 UNIT capsule Take 400 Units by mouth daily., Until Discontinued, Historical Med    Zinc 50 MG CAPS Take 1 capsule by mouth 2 (two) times a week., Until Discontinued, Historical Med       Allergies  Allergen Reactions  . Contrast Media [Iodinated Diagnostic Agents]    Follow-up Information    Follow up with Hoyle Sauer, MD. Schedule an appointment as soon as possible for a visit in 1 week.   Specialty:  Internal Medicine   Contact information:   [address] [place] [address] [phone]       Follow up  with Lennette Bihari, MD. Schedule an appointment as soon as possible for a visit in 2 weeks.   Specialty:  Cardiology   Contact information:   [address] [place] [address] [phone]        The results of significant diagnostics from this hospitalization (including imaging, microbiology, ancillary and laboratory) are listed below for reference.    Significant Diagnostic Studies: Dg Chest 2 View  01/22/2015   CLINICAL DATA:  79 year old male with chest pain and shortness of breath  EXAM: CHEST  2 VIEW  COMPARISON:  Radiograph dated 04/19/2006  FINDINGS: Median sternotomy wires and CABG clips noted. The heart size and mediastinal contours are within normal limits. Both lungs are clear. The visualized skeletal structures are unremarkable.  IMPRESSION: No active cardiopulmonary disease.   Electronically Signed   By: Elgie Collard M.D.   On: 01/22/2015 01:02   Nm Myocar Multi W/spect W/wall Motion / Ef  01/22/2015    There was  no ST segment deviation noted during stress.  This is a low risk study.  Findings consistent with ischemia.  The left ventricular ejection fraction is mildly decreased (45-54%).  Nuclear stress EF: 54%.   Low risk stress nuclear study with a small, medium intensity, reversible  defect in the distal inferior lateral wall and apex; findings consistent  with mild ischemia; EF 54; mild LVE.    Microbiology: No results found for this or any previous visit (from the past 240 hour(s)).   Labs: Basic Metabolic Panel:  Recent Labs Lab 01/22/15 0005  NA 137  K 4.2  CL 108  CO2 23  GLUCOSE 112*  BUN 20  CREATININE 1.62*  CALCIUM 9.3   Liver Function Tests: No results for input(s): AST, ALT, ALKPHOS, BILITOT, PROT, ALBUMIN in the last 168 hours. No results for input(s): LIPASE, AMYLASE in the last 168 hours. No results for input(s): AMMONIA in the last 168 hours. CBC:  Recent Labs Lab 01/22/15 0005  WBC 6.8  HGB 13.8  HCT 41.5   MCV 91.0  PLT 194   Cardiac Enzymes:  Recent Labs Lab 01/22/15 1415  TROPONINI 0.52*   BNP: BNP (last 3 results) No results for input(s): BNP in the last 8760 hours.  ProBNP (last 3 results) No results for input(s): PROBNP in the last 8760 hours.  CBG: No results for input(s): GLUCAP in the last 168 hours.     SignedZannie Cove  [facility] 01/27/2015, 10:37 PM

## 2015-01-31 ENCOUNTER — Other Ambulatory Visit: Payer: Self-pay

## 2015-01-31 ENCOUNTER — Ambulatory Visit (HOSPITAL_COMMUNITY): Payer: Medicare Other | Attending: Cardiology

## 2015-01-31 DIAGNOSIS — I358 Other nonrheumatic aortic valve disorders: Secondary | ICD-10-CM | POA: Insufficient documentation

## 2015-01-31 DIAGNOSIS — Z8249 Family history of ischemic heart disease and other diseases of the circulatory system: Secondary | ICD-10-CM | POA: Diagnosis not present

## 2015-01-31 DIAGNOSIS — Z951 Presence of aortocoronary bypass graft: Secondary | ICD-10-CM | POA: Insufficient documentation

## 2015-01-31 DIAGNOSIS — I77810 Thoracic aortic ectasia: Secondary | ICD-10-CM | POA: Insufficient documentation

## 2015-01-31 DIAGNOSIS — I5189 Other ill-defined heart diseases: Secondary | ICD-10-CM | POA: Diagnosis not present

## 2015-01-31 DIAGNOSIS — E785 Hyperlipidemia, unspecified: Secondary | ICD-10-CM | POA: Insufficient documentation

## 2015-01-31 DIAGNOSIS — I059 Rheumatic mitral valve disease, unspecified: Secondary | ICD-10-CM | POA: Insufficient documentation

## 2015-01-31 DIAGNOSIS — I10 Essential (primary) hypertension: Secondary | ICD-10-CM | POA: Diagnosis not present

## 2015-01-31 DIAGNOSIS — I517 Cardiomegaly: Secondary | ICD-10-CM | POA: Insufficient documentation

## 2015-01-31 DIAGNOSIS — I350 Nonrheumatic aortic (valve) stenosis: Secondary | ICD-10-CM | POA: Diagnosis not present

## 2015-02-05 ENCOUNTER — Ambulatory Visit (INDEPENDENT_AMBULATORY_CARE_PROVIDER_SITE_OTHER): Payer: Medicare Other | Admitting: Cardiovascular Disease

## 2015-02-05 VITALS — BP 142/92 | HR 51 | Ht 69.0 in | Wt 209.7 lb

## 2015-02-05 DIAGNOSIS — Z79899 Other long term (current) drug therapy: Secondary | ICD-10-CM

## 2015-02-05 DIAGNOSIS — I2510 Atherosclerotic heart disease of native coronary artery without angina pectoris: Secondary | ICD-10-CM

## 2015-02-05 DIAGNOSIS — I25810 Atherosclerosis of coronary artery bypass graft(s) without angina pectoris: Secondary | ICD-10-CM | POA: Diagnosis not present

## 2015-02-05 DIAGNOSIS — I350 Nonrheumatic aortic (valve) stenosis: Secondary | ICD-10-CM

## 2015-02-05 DIAGNOSIS — Z951 Presence of aortocoronary bypass graft: Secondary | ICD-10-CM

## 2015-02-05 DIAGNOSIS — I10 Essential (primary) hypertension: Secondary | ICD-10-CM

## 2015-02-05 MED ORDER — HYDRALAZINE HCL 25 MG PO TABS: 25.0000 mg | ORAL_TABLET | Freq: Three times a day (TID) | ORAL | Status: DC

## 2015-02-05 MED ORDER — HYDRALAZINE HCL 25 MG PO TABS: 25.0000 mg | ORAL_TABLET | Freq: Two times a day (BID) | ORAL | Status: DC

## 2015-02-05 MED ORDER — TORSEMIDE 20 MG PO TABS: 20.0000 mg | ORAL_TABLET | Freq: Every day | ORAL | Status: DC

## 2015-02-05 NOTE — Patient Instructions (Signed)
Your physician has recommended you make the following change in your medication: start new prescription for hydralazine 25 mg. This has already been sent to your  Pharmacy.  Your physician recommends that you return for lab work in: 6 weeks.  Your physician recommends that you schedule a follow-up appointment in: 3 months with Dr. Tresa Endo.

## 2015-02-07 ENCOUNTER — Encounter: Payer: Self-pay | Admitting: Cardiovascular Disease

## 2015-02-07 DIAGNOSIS — I10 Essential (primary) hypertension: Secondary | ICD-10-CM | POA: Insufficient documentation

## 2015-02-07 NOTE — Progress Notes (Signed)
Patient ID: Jorge Cherry, male   DOB: 05/20/1935, 79 y.o.   MRN: [id number]     HPI: Jorge Cherry is a 79 y.o. male  who presents to the office today for a followup cardiology evaluation  Following his recent hospitalization.  Jorge Cherry has established CAD dating back to 1994 at which time he underwent CABG revascularization surgery. In October 2003 he underwent stenting to the proximal portion of the vein graft supplying the diagonal vessel with a 3.5x16 mm Taxus DES stent post dilated to 4.0 mm. He has diffuse disease in the distal apical portion of the LAD beyond the LIMA insertion  treated medically. He has documented mild aortic valve stenosis with grade 2 diastolic dysfunction with concentric left ventricular hypertrophy. He has documented renal cysts, history of hypertension, mixed hyperlipidemia. His last Myoview study was in June 2013 which showed a minimal apical defect. Post-stess ejection fraction was 56%.  In March 2014 he was complaining that at times he felt like he was "zoning out."  At that time, I reduced his diltiazem from 300 mg to 240 mg. He felt that this has significantly improved his symptoms with this change and he denies any further sensation. On echo Doppler study, his peak instantaneous gradient across his aortic valve is 25 mm with a mean gradient of only 12 mm an aortic valve area 1.8 cm.   He has hyperlipidemia and in June 2014 his triglycerides were 231 and I further titrated his fish oil to 2 capsules twice a day. Repeat blood work in August 2014 week  showed a BUN of 26 Cr1.67 which improved from  1.71 in June. His lipid panel was improved with a total cholesterol from 172-150. Triglycerides improved from 231-151. HDL remained low at 34. LDL was 86.  A follow-up echo Doppler study on 07/26/2013 showed an ejection fraction of 55-60%.  He had normal diastolic function.  There was evidence for mild aortic valve stenosis with a mean gradient of 11 and a peak gradient of 21  with an estimated aortic valve area of 1.54 cm.  He had mild left atrial dilatation.   An NMR profile  showed increased LDL particle #[id number] despite a calculated LDL of 69.  Triglycerides were still elevated at 182 and HDL cholesterol was low at 32.  Insulin resistance score was increased at 77.  TSH was normal.  He was hospitalized from May 16 through 09/26/2014 with a lower GI bleed due to diverticulosis of the colon with hemorrhage.  He did not undergo colonoscopy.  At that time, he was told to hold his eliquis and aspirin and to resume this on May 30.    When I saw him in follow-up of that hospitalization he was not having any chest pain or shortness of breath.  I recommended that he not restart Effient but instead start Plavix initially and if he tolerated this from a GI standpoint to then resume 81 mg aspirin.   I last saw him a month ago, he complained of blood pressure lability recently with at times recorded blood pressures close to 200 and as low as 100.  When his blood pressures have been significantly elevated.  He is taking garlic tablets and he states this has resulted in a 20 mm drop.  He was on my Cardis 80 mg, torsemide 20 mg twice a day, Spironolactone 12.5 mg daily, Toprol-XL 100 mg daily in addition to Cardizem CD 240 mg.  He is unaware of any recurrent arrhythmia.  Since I saw him, he was evaluated in the hospital on 01/22/2015.  He had somewhat atypical chest pain that was different from his ischemic chest pain and felt like his previous reflux.  His pain was not responsive to nitroglycerin.  He was evaluated in the hospital.  Troponins were negative.  He underwent a Lexiscan Myoview study which was low risk and there was no change in the previously noted small, medium intensity defect in the distal inferolateral wall and apex.  Ejection fraction was 54%.  He subsequently underwent an echo Doppler study on 01/31/2015 which showed an EF of 55-60%.  There was mild LVH.  There was aortic  stenosis which visually appeared moderate but was mild by mean gradient at 15 mm with a peak gradient of 27 mm.  PA pressure was 29 mm.    Still admits that his blood pressures have been elevated up to 160-170 at night. He presents for evaluation.  Past Medical History  Diagnosis Date  . Aortic valve disorder     2D ECHO, 10/19/2011 - EF >55%, LA mild-moderately dilated, mild-moderate tricuspid regurgitation, mild-moderate valvular aortic stenosis, moderate calcification of aortic valve leaflets  . Atherosclerosis of renal artery     RENAL DOPPLER, 12/10/2011 - Left renal artery demonstrated narrowing with elevated velocities consistent with a 1-59% diameter reduction  . Coronary artery disease   . Hypertension     Past Surgical History  Procedure Laterality Date  . Cardiac catheterization  02/27/2004    Coronary intervention and medical management  . Cardiac catheterization  03/04/2004    SVG supplying the diagonal vessel stented with a 3.5x49m Taxus stent post dilated to 4.0 mm  . Coronary artery bypass graft    . Coronary stent placement    . Inguinal hernia repair Right   . Tonsillectomy and adenoidectomy    . Cataract extraction      Allergies  Allergen Reactions  . Contrast Media [Iodinated Diagnostic Agents]     Current Outpatient Prescriptions  Medication Sig Dispense Refill  . aspirin 81 MG tablet Take 1 tablet (81 mg total) by mouth daily. 30 tablet   . atorvastatin (LIPITOR) 40 MG tablet Take 20 mg by mouth daily.    . Cholecalciferol (VITAMIN D-3 PO) Take 750 Units by mouth daily.    . clopidogrel (PLAVIX) 75 MG tablet Take 1 tablet (75 mg total) by mouth daily. 30 tablet 6  . diltiazem (CARDIZEM CD) 240 MG 24 hr capsule TAKE 1 CAPSULE (240 MG TOTAL) BY MOUTH DAILY. 30 capsule 6  . DYMISTA 137-50 MCG/ACT SUSP Place 1 spray into both nostrils daily.    .Marland Kitchenezetimibe (ZETIA) 10 MG tablet Take 1 tablet (10 mg total) by mouth daily. 90 tablet 3  . fenofibrate (TRICOR) 145  MG tablet Take 72.5 mg by mouth daily.     . fish oil-omega-3 fatty acids 1000 MG capsule Take 2 capsules (2 g total) by mouth 2 (two) times daily. 120 capsule 11  . loratadine (CLARITIN) 10 MG tablet Take 10 mg by mouth daily.    . metoprolol succinate (TOPROL-XL) 100 MG 24 hr tablet Take 1 tablet (100 mg total) by mouth daily. Take with or immediately following a meal. 90 tablet 3  . nitroGLYCERIN (NITROLINGUAL) 0.4 MG/SPRAY spray Place 1 spray under the tongue every 5 (five) minutes x 3 doses as needed for chest pain. 12 g 2  . pantoprazole (PROTONIX) 40 MG tablet Take 40 mg by mouth 2 (two) times daily.     .[name]  PROAIR HFA 108 (90 BASE) MCG/ACT inhaler Inhale 1 puff into the lungs as needed for wheezing or shortness of breath.     [name] QVAR 80 MCG/ACT inhaler Inhale 2 puffs into the lungs daily.  1  . spironolactone (ALDACTONE) 25 MG tablet TAKE 0.5 TABLETS (12.5 MG TOTAL) BY MOUTH DAILY. 15 tablet 7  . telmisartan (MICARDIS) 80 MG tablet Take 80 mg by mouth daily.    [name] torsemide (DEMADEX) 20 MG tablet Take 1 tablet (20 mg total) by mouth daily. 90 tablet 3  . vitamin C (ASCORBIC ACID) 500 MG tablet Take 500 mg by mouth daily.    . vitamin E 400 UNIT capsule Take 400 Units by mouth daily.    . Zinc 50 MG CAPS Take 1 capsule by mouth 2 (two) times a week.    . hydrALAZINE (APRESOLINE) 25 MG tablet Take 1 tablet (25 mg total) by mouth 2 (two) times daily. 60 tablet 6   No current facility-administered medications for this visit.    Socially he is married and has 2 children and 4 grandchildren. There is no tobacco alcohol use. Recently he was has been very active.  ROS General: Negative; No fevers, chills, or night sweats;  HEENT: He is hard of hearing; no visual changes, sinus congestion, difficulty swallowing Pulmonary: Negative; No cough, wheezing, shortness of breath, hemoptysis Cardiovascular: Positive for aortic stenosis  No chest pain, presyncope, syncope, palpitations GI: Positive for  recent GI bleed secondary to diverticular disease GU: Negative; No dysuria, hematuria, or difficulty voiding Musculoskeletal: Negative; no myalgias, joint pain, or weakness Hematologic/Oncology: Negative; no easy bruising, bleeding Endocrine: Negative; no heat/cold intolerance; no diabetes Neuro: Negative; no changes in balance, headaches Skin: Negative; No rashes or skin lesions Psychiatric: Negative; No behavioral problems, depression Sleep: Negative; No snoring, daytime sleepiness, hypersomnolence, bruxism, restless legs, hypnogognic hallucinations, no cataplexy Other comprehensive 14 point system review is negative.  PE BP 142/92 mmHg  Pulse 51  Ht _0  (1.753 m)  Wt 209 lb 11.2 oz (95.119 kg)  BMI 30.95 kg/m2   Wt Readings from Last 3 Encounters:  02/05/15 209 lb 11.2 oz (95.119 kg)  01/22/15 203 lb 11.2 oz (92.398 kg)  12/26/14 205 lb 4 oz (93.101 kg)   General: Alert, oriented, no distress.  Skin: normal turgor, no rashes HEENT: Normocephalic, atraumatic. Pupils round and reactive; sclera anicteric;no lid lag,  Nose without nasal septal hypertrophy Mouth/Parynx benign; Mallinpatti scale 2/3 Neck: No JVD, no carotid bruits Lungs: clear to ausculatation and percussion; no wheezing or rales Heart: RRR, s1 s2 normal 2/6 sem in the aortic region. No S3 gallop. No diastolic murmur. No rubs thrills or heaves. Chest wall has a mild pectus excavatum deformity  Abdomen: soft, nontender; no hepatosplenomehaly, BS+; abdominal aorta nontender and not dilated by palpation. He does have mild diastases recti Back: No CVA tenderness Pulses 2+ Extremities: Trace edema in the lower extremities; no clubbing cyanosis, Homan's sign negative  Neurologic: grossly nonfocal; cranial nerves normal Psychological: Normal affect and mood  ECG (independently read by me):  Sinus bradycardia with first-degree AV block. RV conduction delay. No significant ST-T changes.   August 2016ECG (independently  read by me): Sinus bradycardia 57 bpm.  Mild RV conduction delay.  May 2016 ECG (independently read by me): Sinus bradycardia at 49 bpm with first-degree AV block with a PR interval 218 ms.  Right reticular conduction delay.  December 2015 ECG (independently read by me).  For these: Sinus bradycardia 57 bpm.  First-degree AV block with a PR interval at 224 ms.  No significant ST-T changes.  March 2015 ECG (independently read by me): Sinus rhythm at 59 beats per minute. Mild LVH by voltage criteria in aVL. Normal intervals.  Prior 01/05/2013 ECG: Normal sinus rhythm at 62. Mild RV conduction delay. PR interval 204 ms, QTc interval 452 ms.  LABS: BMP Latest Ref Rng 01/22/2015 12/26/2014 09/26/2014  Glucose 65 - 99 mg/dL 112(H) 91 97  BUN 6 - 20 mg/dL 20 27(H) 22(H)  Creatinine 0.61 - 1.24 mg/dL 1.62(H) 1.67(H) 1.48(H)  Sodium 135 - 145 mmol/L 137 142 140  Potassium 3.5 - 5.1 mmol/L 4.2 4.8 4.0  Chloride 101 - 111 mmol/L 108 108 112(H)  CO2 22 - 32 mmol/L _0 Calcium 8.9 - 10.3 mg/dL 9.3 9.4 9.0   Hepatic Function Latest Ref Rng 09/24/2014 04/09/2014 07/12/2013  Total Protein 6.5 - 8.1 g/dL 5.8(L) 6.5 6.4  Albumin 3.5 - 5.0 g/dL 3.3(L) 4.0 4.1  AST 15 - 41 U/L _1 ALT 17 - 63 U/L _2 Alk Phosphatase 38 - 126 U/L 43 46 50  Total Bilirubin 0.3 - 1.2 mg/dL 0.5 0.4 0.6   CBC Latest Ref Rng 01/22/2015 12/26/2014 09/26/2014  WBC 4.0 - 10.5 K/uL 6.8 7.7 5.3  Hemoglobin 13.0 - 17.0 g/dL 13.8 13.0 10.1(L)  Hematocrit 39.0 - 52.0 % 41.5 39.9 30.4(L)  Platelets 150 - 400 K/uL 194 232 154   Lab Results  Component Value Date   MCV 91.0 01/22/2015   MCV 90.1 12/26/2014   MCV 92.7 09/26/2014   Lab Results  Component Value Date   TSH 3.007 04/09/2014  No results found for: HGBA1C  Lipid Panel     Component Value Date/Time   CHOL 137 04/09/2014 1115   CHOL 150 12/30/2012 0944   TRIG 182* 04/09/2014 1115   TRIG 151* 12/30/2012 0944   HDL 32* 04/09/2014 1115   HDL 34*  12/30/2012 0944   CHOLHDL 4.4 12/30/2012 0944   VLDL 30 12/30/2012 0944   LDLCALC 69 04/09/2014 1115   LDLCALC 86 12/30/2012 0944     RADIOLOGY: No results found.    ASSESSMENT AND PLAN: Jorge Cherry is a 79 year old white male who is now 22 years status post CABG revascularization surgery.  He is status post DES stenting to the graft supplying the diagonal vessel 12 years ago.  His last echo Doppler study demonstrated mild aortic valve stenosis with normal systolic function.   Reviewed his recent hospital records from his 01/22/1999 16 presentation. He continues to have blood pressure lability despite taking torsemide 20 mg daily, Micardis 80 mg, spironolactone 12.5 mg daily, Toprol-XL 100 mg and Cardizem CD 240 mg. He states his blood pressures at night typically are 160-170.  I'm electing to add hydralazine at 25 mg twice a day to his medical regimen. I will obtain a be met in 6 weeks.  He is on Zetia, fenofibrate, and atorvastatin for hyperlipidemia with target LDL less than 70. He  Has trivial pedal edema.  I reviewed his nuclear study.  An echo Doppler studies this month with him in detail. He will continue to monitor his blood pressure at home.  I will see him in 3 months for reevaluation.  Time spent: 25 minutes  Troy Sine, MD, [facility]  02/07/2015 7:31 PM

## 2015-02-22 ENCOUNTER — Telehealth: Payer: Self-pay | Admitting: Cardiovascular Disease

## 2015-02-22 NOTE — Telephone Encounter (Signed)
[facility]15 with Dr Tresa EndoKelly.  [facility] Dr Landry DykeKelly's schedule on 05/03/15.

## 2015-03-05 ENCOUNTER — Other Ambulatory Visit: Payer: Self-pay | Admitting: Cardiovascular Disease

## 2015-03-19 ENCOUNTER — Ambulatory Visit (INDEPENDENT_AMBULATORY_CARE_PROVIDER_SITE_OTHER): Payer: Medicare Other | Admitting: Allergy and Immunology

## 2015-03-19 VITALS — BP 152/82 | HR 54 | Temp 97.8°F | Resp 16

## 2015-03-19 DIAGNOSIS — K219 Gastro-esophageal reflux disease without esophagitis: Secondary | ICD-10-CM

## 2015-03-19 DIAGNOSIS — J4530 Mild persistent asthma, uncomplicated: Secondary | ICD-10-CM | POA: Diagnosis not present

## 2015-03-19 DIAGNOSIS — J3089 Other allergic rhinitis: Secondary | ICD-10-CM | POA: Diagnosis not present

## 2015-03-19 DIAGNOSIS — I10 Essential (primary) hypertension: Secondary | ICD-10-CM | POA: Diagnosis not present

## 2015-03-19 MED ORDER — BECLOMETHASONE DIPROPIONATE 80 MCG/ACT IN AERS: INHALATION_SPRAY | RESPIRATORY_TRACT | Status: DC

## 2015-03-19 MED ORDER — ALBUTEROL SULFATE HFA 108 (90 BASE) MCG/ACT IN AERS: INHALATION_SPRAY | RESPIRATORY_TRACT | Status: DC

## 2015-03-19 NOTE — Assessment & Plan Note (Signed)
Well-controlled.  We will stepdown therapy at this time.  Decrease Qvar 80 g to one inhalation via spacer device daily.  During upper respiratory tract infections or asthma flares, increase to one inhalation via spacer device twice a day.  Continue albuterol HFA, 1-2 inhalations every 4-6 hours as needed.  Subjective and objective measures of pulmonary function will be followed and the treatment plan will be adjusted accordingly.

## 2015-03-19 NOTE — Progress Notes (Signed)
History of present illness: HPI Comments: Jorge Cherry is a 80 y.o. male with asthma, allergic rhinoconjunctivitis, and gastroesophageal reflux disease who presents today for follow up.  He reports that his asthma and rhinitis symptoms have been well-controlled in the interval since his previous visit.  On average, he requires albuterol rescue one time per month and denies nocturnal awakenings due to lower respiratory symptoms or limitations in daily activities due to asthma symptoms.  He currently takes Qvar 80 g, 2 inhalations via spacer device daily.  He has no nasal symptom complaints other than occasional nasal congestion due to rapid weather changes.  He went to the emergency department on September 13 for chest pain and was hospitalized overnight for observation.  Cardiac etiology was ruled out and he was discharged with the diagnosis of gastroesophageal reflux disease.  He states that his reflux is currently well controlled with pantoprazole 40 mg daily.     Assessment and plan: Mild persistent asthma Well-controlled.  We will stepdown therapy at this time.  Decrease Qvar 80 g to one inhalation via spacer device daily.  During upper respiratory tract infections or asthma flares, increase to one inhalation via spacer device twice a day.  Continue albuterol HFA, 1-2 inhalations every 4-6 hours as needed.  Subjective and objective measures of pulmonary function will be followed and the treatment plan will be adjusted accordingly.  Allergic rhinitis  Continue time Dymista nasal spray and nasal saline irrigation as needed.  Gastroesophageal reflux disease  Continue pantoprazole as prescribed.  Essential hypertension  Continue current medications and obtain appropriate follow up with primary care physician and cardiologist.    Medications ordered this encounter: Meds ordered this encounter  Medications  . beclomethasone (QVAR) 80 MCG/ACT inhaler    Sig: INHALE ONE PUFF ONCE DAILY  TO PREVENT COUGH OR WHEEZE. RINSE, GARGLE, AND SPIT AFTER USE.    Dispense:  1 Inhaler    Refill:  5  . albuterol (PROAIR HFA) 108 (90 BASE) MCG/ACT inhaler    Sig: INHALE TWO PUFFS EVERY 4-6 HOURS AS NEEDED FOR COUGH OR WHEEZE    Dispense:  1 Inhaler    Refill:  1    Diagnositics: Spirometry: FVC is 3.19 L and FEV1 is 2.91 L (102% predicted).  Please see scanned spirometry results for details.     Physical examination: Blood pressure 152/82, pulse 54, temperature 97.8 F (36.6 C), resp. rate 16.  General: Alert, interactive, in no acute distress. HEENT: TMs pearly gray, turbinates minimally edematous without discharge, post-pharynx moderately erythematous. Neck: Supple without lymphadenopathy. Lungs: Clear to auscultation without wheezing, rhonchi or rales. CV: RRR with normal s1 s2 and 2/6 systolic ejection murmur. No S3 gallop, diastolic murmur, rubs, thrills, or heaves. Skin: Warm and dry, without lesions or rashes.  The following portions of the patient's history were reviewed and updated as appropriate: allergies, current medications, past family history, past medical history, past social history, past surgical history and problem list.  Outpatient medications:   Medication List       This list is accurate as of: 03/19/15 12:56 PM.  Always use your most recent med list.               albuterol 108 (90 BASE) MCG/ACT inhaler  Commonly known as:  PROAIR HFA  INHALE TWO PUFFS EVERY 4-6 HOURS AS NEEDED FOR COUGH OR WHEEZE     aspirin 81 MG tablet  Take 1 tablet (81 mg total) by mouth daily.     atorvastatin  40 MG tablet  Commonly known as:  LIPITOR  Take 20 mg by mouth daily.     beclomethasone 80 MCG/ACT inhaler  Commonly known as:  QVAR  INHALE ONE PUFF ONCE DAILY TO PREVENT COUGH OR WHEEZE. RINSE, GARGLE, AND SPIT AFTER USE.     clopidogrel 75 MG tablet  Commonly known as:  PLAVIX  Take 1 tablet (75 mg total) by mouth daily.     diltiazem 240 MG 24 hr  capsule  Commonly known as:  CARDIZEM CD  TAKE 1 CAPSULE BY MOUTH DAILY.     DYMISTA 137-50 MCG/ACT Susp  Generic drug:  Azelastine-Fluticasone  Place 1 spray into both nostrils daily.     ezetimibe 10 MG tablet  Commonly known as:  ZETIA  Take 1 tablet (10 mg total) by mouth daily.     fenofibrate 145 MG tablet  Commonly known as:  TRICOR  Take 72.5 mg by mouth daily.     fish oil-omega-3 fatty acids 1000 MG capsule  Take 2 capsules (2 g total) by mouth 2 (two) times daily.     hydrALAZINE 25 MG tablet  Commonly known as:  APRESOLINE  Take 1 tablet (25 mg total) by mouth 2 (two) times daily.     loratadine 10 MG tablet  Commonly known as:  CLARITIN  Take 10 mg by mouth daily.     metoprolol succinate 100 MG 24 hr tablet  Commonly known as:  TOPROL-XL  Take 1 tablet (100 mg total) by mouth daily. Take with or immediately following a meal.     nitroGLYCERIN 0.4 MG/SPRAY spray  Commonly known as:  NITROLINGUAL  Place 1 spray under the tongue every 5 (five) minutes x 3 doses as needed for chest pain.     pantoprazole 40 MG tablet  Commonly known as:  PROTONIX  Take 40 mg by mouth 2 (two) times daily.     spironolactone 25 MG tablet  Commonly known as:  ALDACTONE  TAKE 0.5 TABLETS (12.5 MG TOTAL) BY MOUTH DAILY.     telmisartan 80 MG tablet  Commonly known as:  MICARDIS  Take 80 mg by mouth daily.     torsemide 20 MG tablet  Commonly known as:  DEMADEX  Take 1 tablet (20 mg total) by mouth daily.     vitamin C 500 MG tablet  Commonly known as:  ASCORBIC ACID  Take 500 mg by mouth daily.     VITAMIN D-3 PO  Take 750 Units by mouth daily.     vitamin E 400 UNIT capsule  Take 400 Units by mouth daily.     Zinc 50 MG Caps  Take 1 capsule by mouth 2 (two) times a week.        Known medication allergies: Allergies  Allergen Reactions  . Altace [Ramipril] Other (See Comments)    Mouth swelling  . Contrast Media [Iodinated Diagnostic Agents]   . Mucinex  [Guaifenesin Er] Hives    I appreciate the opportunity to take part in this Jorge Cherry's care. Please do not hesitate to contact me with questions.  Sincerely,   R. Jorene Guestarter [name], MD

## 2015-03-19 NOTE — Assessment & Plan Note (Signed)
   Continue time Dymista nasal spray and nasal saline irrigation as needed.

## 2015-03-19 NOTE — Assessment & Plan Note (Signed)
   Continue pantoprazole as prescribed. 

## 2015-03-19 NOTE — Assessment & Plan Note (Signed)
   Continue current medications and obtain appropriate follow up with primary care physician and cardiologist.

## 2015-03-19 NOTE — Patient Instructions (Signed)
Mild persistent asthma Well-controlled.  We will stepdown therapy at this time.  Decrease Qvar 80 g to one inhalation via spacer device daily.  During upper respiratory tract infections or asthma flares, increase to one inhalation via spacer device twice a day.  Continue albuterol HFA, 1-2 inhalations every 4-6 hours as needed.  Subjective and objective measures of pulmonary function will be followed and the treatment plan will be adjusted accordingly.  Allergic rhinitis  Continue time Dymista nasal spray and nasal saline irrigation as needed.  Gastroesophageal reflux disease  Continue pantoprazole as prescribed.   Return in about 6 months (around 09/16/2015), or if symptoms worsen or fail to improve.

## 2015-03-20 LAB — BASIC METABOLIC PANEL
BUN: 32 mg/dL — ABNORMAL HIGH (ref 7–25)
CHLORIDE: 107 mmol/L (ref 98–110)
CO2: 22 mmol/L (ref 20–31)
CREATININE: 1.74 mg/dL — AB (ref 0.70–1.18)
Calcium: 9.3 mg/dL (ref 8.6–10.3)
Glucose, Bld: 99 mg/dL (ref 65–99)
Potassium: 4.6 mmol/L (ref 3.5–5.3)
Sodium: 140 mmol/L (ref 135–146)

## 2015-03-21 ENCOUNTER — Other Ambulatory Visit: Payer: Self-pay

## 2015-03-21 MED ORDER — PANTOPRAZOLE SODIUM 40 MG PO TBEC: 40.0000 mg | DELAYED_RELEASE_TABLET | Freq: Two times a day (BID) | ORAL | Status: DC

## 2015-03-29 ENCOUNTER — Other Ambulatory Visit: Payer: Self-pay | Admitting: *Deleted

## 2015-03-29 ENCOUNTER — Telehealth: Payer: Self-pay | Admitting: *Deleted

## 2015-03-29 DIAGNOSIS — R899 Unspecified abnormal finding in specimens from other organs, systems and tissues: Secondary | ICD-10-CM

## 2015-03-29 DIAGNOSIS — Z79899 Other long term (current) drug therapy: Secondary | ICD-10-CM

## 2015-03-29 NOTE — Telephone Encounter (Signed)
Patient notified of lab results and recommendations. 

## 2015-03-29 NOTE — Telephone Encounter (Signed)
-----   Message from Lennette Biharihomas A Kelly, MD sent at 03/25/2015  7:45 AM EST ----- Decrease torsemide by half; check bmet in 1 week

## 2015-04-09 LAB — BASIC METABOLIC PANEL
BUN: 29 mg/dL — AB (ref 7–25)
CHLORIDE: 109 mmol/L (ref 98–110)
CO2: 21 mmol/L (ref 20–31)
Calcium: 9.4 mg/dL (ref 8.6–10.3)
Creat: 1.59 mg/dL — ABNORMAL HIGH (ref 0.70–1.18)
Glucose, Bld: 82 mg/dL (ref 65–99)
POTASSIUM: 4.3 mmol/L (ref 3.5–5.3)
Sodium: 140 mmol/L (ref 135–146)

## 2015-04-14 ENCOUNTER — Other Ambulatory Visit: Payer: Self-pay | Admitting: Cardiovascular Disease

## 2015-04-15 NOTE — Telephone Encounter (Signed)
REFILL 

## 2015-04-16 ENCOUNTER — Encounter: Payer: Self-pay | Admitting: *Deleted

## 2015-04-26 ENCOUNTER — Other Ambulatory Visit: Payer: Self-pay | Admitting: Cardiovascular Disease

## 2015-05-03 ENCOUNTER — Ambulatory Visit (INDEPENDENT_AMBULATORY_CARE_PROVIDER_SITE_OTHER): Payer: Medicare Other | Admitting: Cardiovascular Disease

## 2015-05-03 ENCOUNTER — Encounter: Payer: Self-pay | Admitting: Cardiovascular Disease

## 2015-05-03 ENCOUNTER — Other Ambulatory Visit: Payer: Self-pay | Admitting: Allergy and Immunology

## 2015-05-03 VITALS — BP 150/90 | HR 51 | Ht 69.0 in | Wt 214.7 lb

## 2015-05-03 DIAGNOSIS — I2510 Atherosclerotic heart disease of native coronary artery without angina pectoris: Secondary | ICD-10-CM

## 2015-05-03 DIAGNOSIS — E785 Hyperlipidemia, unspecified: Secondary | ICD-10-CM

## 2015-05-03 DIAGNOSIS — I350 Nonrheumatic aortic (valve) stenosis: Secondary | ICD-10-CM

## 2015-05-03 DIAGNOSIS — Z951 Presence of aortocoronary bypass graft: Secondary | ICD-10-CM

## 2015-05-03 DIAGNOSIS — N183 Chronic kidney disease, stage 3 unspecified: Secondary | ICD-10-CM

## 2015-05-03 DIAGNOSIS — N189 Chronic kidney disease, unspecified: Secondary | ICD-10-CM | POA: Diagnosis not present

## 2015-05-03 DIAGNOSIS — I10 Essential (primary) hypertension: Secondary | ICD-10-CM | POA: Diagnosis not present

## 2015-05-03 MED ORDER — HYDRALAZINE HCL 25 MG PO TABS: 25.0000 mg | ORAL_TABLET | Freq: Three times a day (TID) | ORAL | Status: DC

## 2015-05-03 NOTE — Patient Instructions (Addendum)
Your physician wants you to follow-up in: 6 months or sooner if needed. You will receive a reminder letter in the mail two months in advance. If you don't receive a letter, please call our office to schedule the follow-up appointment.  If you need a refill on your cardiac medications before your next appointment, please call your pharmacy.  Your physician has recommended you make the following change in your medication: the hydralazine has been increased to 1 three times a day.

## 2015-05-03 NOTE — Progress Notes (Signed)
Patient ID: Jorge Cherry, male   DOB: 09/02/1935, 79 y.o.   MRN: [id number]    Primary MD: Dr. Dagmar Hait  HPI: Jorge Cherry is a 79 y.o. male  who presents to the office today for a 3 month followup cardiology evaluation.  [name] has established CAD dating back to 1994 at which time he underwent CABG revascularization surgery. In October 2003 he underwent stenting to the proximal portion of the vein graft supplying the diagonal vessel with a 3.5x16 mm Taxus DES stent post dilated to 4.0 mm. He has diffuse disease in the distal apical portion of the LAD beyond the LIMA insertion  treated medically. He has documented mild aortic valve stenosis with grade 2 diastolic dysfunction with concentric left ventricular hypertrophy. He has documented renal cysts, history of hypertension, mixed hyperlipidemia. His last Myoview study was in June 2013 which showed a minimal apical defect. Post-stess ejection fraction was 56%.  In March 2014 he was complaining that at times he felt like he was "zoning out."  At that time, I reduced his diltiazem from 300 mg to 240 mg. He felt that this has significantly improved his symptoms with this change and he denies any further sensation. On echo Doppler study, his peak instantaneous gradient across his aortic valve is 25 mm with a mean gradient of only 12 mm an aortic valve area 1.8 cm.   He has hyperlipidemia and in June 2014 his triglycerides were 231 and I further titrated his fish oil to 2 capsules twice a day. Repeat blood work in August 2014 week  showed a BUN of 26 Cr1.67 which improved from  1.71 in June. His lipid panel was improved with a total cholesterol from 172-150. Triglycerides improved from 231-151. HDL remained low at 34. LDL was 86.  A follow-up echo Doppler study on 07/26/2013 showed an ejection fraction of 55-60%.  He had normal diastolic function.  There was evidence for mild aortic valve stenosis with a mean gradient of 11 and a peak gradient of 21 with an  estimated aortic valve area of 1.54 cm.  He had mild left atrial dilatation.   An NMR profile  showed increased LDL particle #[id number] despite a calculated LDL of 69.  Triglycerides were still elevated at 182 and HDL cholesterol was low at 32.  Insulin resistance score was increased at 77.  TSH was normal.  He was hospitalized from May 16 through 09/26/2014 with a lower GI bleed due to diverticulosis of the colon with hemorrhage.  He did not undergo colonoscopy.  At that time, he was told to hold his eliquis and aspirin and to resume this on May 30.    When I saw him in follow-up of that hospitalization he was not having any chest pain or shortness of breath.  I recommended that he not restart Effient but instead start Plavix initially and if he tolerated this from a GI standpoint to then resume 81 mg aspirin.   I last saw him a month ago, he complained of blood pressure lability recently with at times recorded blood pressures close to 200 and as low as 100.  When his blood pressures have been significantly elevated.  He is taking garlic tablets and he states this has resulted in a 20 mm drop.  He was on my Cardis 80 mg, torsemide 20 mg twice a day, Spironolactone 12.5 mg daily, Toprol-XL 100 mg daily in addition to Cardizem CD 240 mg.  He is unaware of any recurrent  arrhythmia.  He was evaluated in the hospital on 01/22/2015.  He had somewhat atypical chest pain that was different from his ischemic chest pain and felt like his previous reflux.  His pain was not responsive to nitroglycerin.  He was evaluated in the hospital.  Troponins were negative.  He underwent a Lexiscan Myoview study which was low risk and there was no change in the previously noted small, medium intensity defect in the distal inferolateral wall and apex.  Ejection fraction was 54%.  He subsequently underwent an echo Doppler study on 01/31/2015 which showed an EF of 55-60%.  There was mild LVH.  There was aortic stenosis which visually  appeared moderate but was mild by mean gradient at 15 mm with a peak gradient of 27 mm.  PA pressure was 29 mm.  Still admits that his blood pressures have been elevated up to 160-170 at night.   When  I saw him in follow-up of his hospitalization, I added Habitrol is seen 20 50 g twice a day for improved blood pressure control. He now has been taking diltiazem 240 mg daily, hydralazine 25 g twice a day, Toprol-XL 100 mg daily, my Cardis 80 mg daily and torsemide 20 mg. He states his blood pressure is better but at times his blood pressure is still elevated at night. He also is on Zetia 10 mg and atorvastatin 40 mg for hyperlipidemia. He continues to be on dual antiplatelet therapy.  He presents for evaluation.  Past Medical History  Diagnosis Date  . Aortic valve disorder     2D ECHO, 10/19/2011 - EF >55%, LA mild-moderately dilated, mild-moderate tricuspid regurgitation, mild-moderate valvular aortic stenosis, moderate calcification of aortic valve leaflets  . Atherosclerosis of renal artery ([place])     RENAL DOPPLER, 12/10/2011 - Left renal artery demonstrated narrowing with elevated velocities consistent with a 1-59% diameter reduction  . Coronary artery disease   . Hypertension     Past Surgical History  Procedure Laterality Date  . Cardiac catheterization  02/27/2004    Coronary intervention and medical management  . Cardiac catheterization  03/04/2004    SVG supplying the diagonal vessel stented with a 3.5x52m Taxus stent post dilated to 4.0 mm  . Coronary artery bypass graft    . Coronary stent placement    . Inguinal hernia repair Right   . Tonsillectomy and adenoidectomy    . Cataract extraction      Allergies  Allergen Reactions  . Altace [Ramipril] Other (See Comments)    Mouth swelling  . Contrast Media [Iodinated Diagnostic Agents]   . Mucinex [Guaifenesin Er] Hives    Current Outpatient Prescriptions  Medication Sig Dispense Refill  . albuterol (PROAIR HFA) 108 (90  BASE) MCG/ACT inhaler INHALE TWO PUFFS EVERY 4-6 HOURS AS NEEDED FOR COUGH OR WHEEZE 1 Inhaler 1  . aspirin 81 MG tablet Take 1 tablet (81 mg total) by mouth daily. 30 tablet   . atorvastatin (LIPITOR) 40 MG tablet Take 20 mg by mouth daily.    . beclomethasone (QVAR) 80 MCG/ACT inhaler INHALE ONE PUFF ONCE DAILY TO PREVENT COUGH OR WHEEZE. RINSE, GARGLE, AND SPIT AFTER USE. 1 Inhaler 5  . Cholecalciferol (VITAMIN D-3 PO) Take 750 Units by mouth daily.    . clopidogrel (PLAVIX) 75 MG tablet Take 1 tablet (75 mg total) by mouth daily. 30 tablet 6  . diltiazem (CARDIZEM CD) 240 MG 24 hr capsule TAKE 1 CAPSULE BY MOUTH DAILY. 30 capsule 3  . DYMISTA 137-50  MCG/ACT SUSP Place 1 spray into both nostrils daily.    . fenofibrate (TRICOR) 145 MG tablet Take 72.5 mg by mouth daily.     . fish oil-omega-3 fatty acids 1000 MG capsule Take 2 capsules (2 g total) by mouth 2 (two) times daily. 120 capsule 11  . hydrALAZINE (APRESOLINE) 25 MG tablet Take 1 tablet (25 mg total) by mouth 3 (three) times daily. 90 tablet 6  . loratadine (CLARITIN) 10 MG tablet Take 10 mg by mouth daily.    . metoprolol succinate (TOPROL-XL) 100 MG 24 hr tablet TAKE 1 TABLET (100 MG TOTAL) BY MOUTH DAILY. TAKE WITH OR IMMEDIATELY FOLLOWING A MEAL. 30 tablet 0  . nitroGLYCERIN (NITROLINGUAL) 0.4 MG/SPRAY spray Place 1 spray under the tongue every 5 (five) minutes x 3 doses as needed for chest pain. 12 g 2  . pantoprazole (PROTONIX) 40 MG tablet Take 1 tablet (40 mg total) by mouth 2 (two) times daily. 60 tablet 5  . spironolactone (ALDACTONE) 25 MG tablet TAKE 0.5 TABLETS (12.5 MG TOTAL) BY MOUTH DAILY. 15 tablet 7  . telmisartan (MICARDIS) 80 MG tablet Take 80 mg by mouth daily.    [name] torsemide (DEMADEX) 20 MG tablet Take 1 tablet (20 mg total) by mouth daily. 90 tablet 3  . vitamin C (ASCORBIC ACID) 500 MG tablet Take 500 mg by mouth daily.    . vitamin E 400 UNIT capsule Take 400 Units by mouth daily.    [name] ZETIA 10 MG tablet TAKE  1 TABLET (10 MG TOTAL) BY MOUTH DAILY. 90 tablet 0  . Zinc 50 MG CAPS Take 1 capsule by mouth 2 (two) times a week.     No current facility-administered medications for this visit.    Socially he is married and has 2 children and 4 grandchildren. There is no tobacco alcohol use. Recently he was has been very active.  ROS General: Negative; No fevers, chills, or night sweats;  HEENT: He is hard of hearing; no visual changes, sinus congestion, difficulty swallowing Pulmonary: Negative; No cough, wheezing, shortness of breath, hemoptysis Cardiovascular: Positive for aortic stenosis  No chest pain, presyncope, syncope, palpitations GI: Positive for recent GI bleed secondary to diverticular disease GU: Negative; No dysuria, hematuria, or difficulty voiding Musculoskeletal: Negative; no myalgias, joint pain, or weakness Hematologic/Oncology: Negative; no easy bruising, bleeding Endocrine: Negative; no heat/cold intolerance; no diabetes Neuro: Negative; no changes in balance, headaches Skin: Negative; No rashes or skin lesions Psychiatric: Negative; No behavioral problems, depression Sleep: Negative; No snoring, daytime sleepiness, hypersomnolence, bruxism, restless legs, hypnogognic hallucinations, no cataplexy Other comprehensive 14 point system review is negative.  PE BP 150/90 mmHg  Pulse 51  Ht 5' 9"  (1.753 m)  Wt 214 lb 11.2 oz (97.387 kg)  BMI 31.69 kg/m2   Repeat blood pressure by me was 148/86  Wt Readings from Last 3 Encounters:  05/03/15 214 lb 11.2 oz (97.387 kg)  02/05/15 209 lb 11.2 oz (95.119 kg)  01/22/15 203 lb 11.2 oz (92.398 kg)   General: Alert, oriented, no distress.  Skin: normal turgor, no rashes HEENT: Normocephalic, atraumatic. Pupils round and reactive; sclera anicteric;no lid lag,  Nose without nasal septal hypertrophy Mouth/Parynx benign; Mallinpatti scale 2/3 Neck: No JVD, no carotid bruits Lungs: clear to ausculatation and percussion; no wheezing or  rales Heart: RRR, s1 s2 normal 2/6 sem in the aortic region. No S3 gallop. No diastolic murmur. No rubs thrills or heaves. Chest wall has a mild pectus excavatum deformity  Abdomen: soft, nontender; no hepatosplenomehaly, BS+; abdominal aorta nontender and not dilated by palpation. He does have mild diastases recti Back: No CVA tenderness Pulses 2+ Extremities: Trace edema in the lower extremities; no clubbing cyanosis, Homan's sign negative  Neurologic: grossly nonfocal; cranial nerves normal Psychological: Normal affect and mood  ECG (independently read by me):  Sinus bradycardia 51 bpm.  No ectopy. QTc interval 429 ms.  LVH by voltage criteria in aVL.  September 2016 ECG (independently read by me):  Sinus bradycardia with first-degree AV block. RV conduction delay. No significant ST-T changes.   August 2016ECG (independently read by me): Sinus bradycardia 57 bpm.  Mild RV conduction delay.  May 2016 ECG (independently read by me): Sinus bradycardia at 49 bpm with first-degree AV block with a PR interval 218 ms.  Right reticular conduction delay.  December 2015 ECG (independently read by me).  For these: Sinus bradycardia 57 bpm.  First-degree AV block with a PR interval at 224 ms.  No significant ST-T changes.  March 2015 ECG (independently read by me): Sinus rhythm at 59 beats per minute. Mild LVH by voltage criteria in aVL. Normal intervals.  Prior 01/05/2013 ECG: Normal sinus rhythm at 62. Mild RV conduction delay. PR interval 204 ms, QTc interval 452 ms.  LABS: BMP Latest Ref Rng 04/08/2015 03/19/2015 01/22/2015  Glucose 65 - 99 mg/dL 82 99 112(H)  BUN 7 - 25 mg/dL 29(H) 32(H) 20  Creatinine 0.70 - 1.18 mg/dL 1.59(H) 1.74(H) 1.62(H)  Sodium 135 - 146 mmol/L 140 140 137  Potassium 3.5 - 5.3 mmol/L 4.3 4.6 4.2  Chloride 98 - 110 mmol/L 109 107 108  CO2 20 - 31 mmol/L 21 22 23   Calcium 8.6 - 10.3 mg/dL 9.4 9.3 9.3   Hepatic Function Latest Ref Rng 09/24/2014 04/09/2014 07/12/2013    Total Protein 6.5 - 8.1 g/dL 5.8(L) 6.5 6.4  Albumin 3.5 - 5.0 g/dL 3.3(L) 4.0 4.1  AST 15 - 41 U/L 20 18 17   ALT 17 - 63 U/L 21 15 15   Alk Phosphatase 38 - 126 U/L 43 46 50  Total Bilirubin 0.3 - 1.2 mg/dL 0.5 0.4 0.6   CBC Latest Ref Rng 01/22/2015 12/26/2014 09/26/2014  WBC 4.0 - 10.5 K/uL 6.8 7.7 5.3  Hemoglobin 13.0 - 17.0 g/dL 13.8 13.0 10.1(L)  Hematocrit 39.0 - 52.0 % 41.5 39.9 30.4(L)  Platelets 150 - 400 K/uL 194 232 154   Lab Results  Component Value Date   MCV 91.0 01/22/2015   MCV 90.1 12/26/2014   MCV 92.7 09/26/2014   Lab Results  Component Value Date   TSH 3.007 04/09/2014  No results found for: HGBA1C   Lipid Panel     Component Value Date/Time   CHOL 137 04/09/2014 1115   CHOL 150 12/30/2012 0944   TRIG 182* 04/09/2014 1115   TRIG 151* 12/30/2012 0944   HDL 32* 04/09/2014 1115   HDL 34* 12/30/2012 0944   CHOLHDL 4.4 12/30/2012 0944   VLDL 30 12/30/2012 0944   LDLCALC 69 04/09/2014 1115   LDLCALC 86 12/30/2012 0944     RADIOLOGY: No results found.    ASSESSMENT AND PLAN: Mr. Couser is a 79 year old white male who is  23 years status post CABG revascularization surgery.  He is status post DES stenting to the graft supplying the diagonal vessel 13 years ago.  His last echo Doppler study demonstrated mild aortic valve stenosis with normal systolic function.    He had recent development of significant blood  pressure lability requiring medication addition. His blood pressure today is improved.  His torsemide dose had been reduced to 10 mg due to renal insufficiency , stage III, and most recent serum creatinine is improved at 1.59. Presently, I have recommended further titration of his hydralazine to 25 mg 3 times a day or 37.5 mg twice a day.  He would prefer to take this in a 3 dose regimen. He will continue his present dose of diltiazem at 240 mg, spironolactone at 12.5 mg, my Cardis and 80 mg and reduced dose of torsemide. He sees Dr. Daleen Bo, for primary care  and will be seeing him next month.  Repeat blood work will be obtained.  I did review blood work from Dr. Rikki Spearing in June 2016 which showed his total cholesterol 104, triglycerides 113, HDL 32, and LDL 49.  On his current treatment. I will see him in 6 months for cardiology reevaluation.   Time spent: 25 minutes  Troy Sine, MD, [facility]  05/03/2015 3:54 PM

## 2015-05-12 ENCOUNTER — Other Ambulatory Visit: Payer: Self-pay | Admitting: Cardiovascular Disease

## 2015-05-14 NOTE — Telephone Encounter (Signed)
Rx request sent to pharmacy.  

## 2015-05-27 ENCOUNTER — Other Ambulatory Visit: Payer: Self-pay | Admitting: Cardiovascular Disease

## 2015-05-27 NOTE — Telephone Encounter (Signed)
Rx(s) sent to pharmacy electronically.  

## 2015-06-26 ENCOUNTER — Other Ambulatory Visit: Payer: Self-pay | Admitting: Cardiovascular Disease

## 2015-06-27 NOTE — Telephone Encounter (Signed)
Rx request sent to pharmacy.  

## 2015-07-07 ENCOUNTER — Other Ambulatory Visit: Payer: Self-pay | Admitting: Cardiovascular Disease

## 2015-07-08 NOTE — Telephone Encounter (Signed)
Rx(s) sent to pharmacy electronically.4

## 2015-07-18 ENCOUNTER — Other Ambulatory Visit: Payer: Self-pay | Admitting: Cardiovascular Disease

## 2015-07-18 NOTE — Telephone Encounter (Signed)
Rx refill sent to pharmacy. 

## 2015-08-14 ENCOUNTER — Other Ambulatory Visit: Payer: Self-pay | Admitting: Cardiovascular Disease

## 2015-08-15 ENCOUNTER — Other Ambulatory Visit: Payer: Self-pay | Admitting: *Deleted

## 2015-08-15 MED ORDER — FENOFIBRATE 145 MG PO TABS: 72.5000 mg | ORAL_TABLET | Freq: Every day | ORAL | Status: DC

## 2015-09-10 ENCOUNTER — Telehealth: Payer: Self-pay | Admitting: Cardiovascular Disease

## 2015-09-10 NOTE — Telephone Encounter (Signed)
New message     Pt is scheduled to see Dr Tresa EndoKelly in sept.  Pt thinks he is supposed to see him in may.  Please check with Dr Tresa EndoKelly and let him know when he is to see him again

## 2015-09-10 NOTE — Telephone Encounter (Signed)
SPOKE TO WIFE PATIENT LAST OFFICE VISIT INDICATE AN APPOINTMENT IN 6 MONTHS,WHICH WOULD BE June 2017.  THE SCHEDULE IS COMPLETEly FULL FOR June,July and August 2017 Informed wife we can place on a waitlist for June 2017. Offered if any issus please call back - schedule with an extender if needed, wife states patient will prefer to see Dr Tresa EndoKELLY  SENT MESSAGE TO SCHEDULER FOR WAITLIST

## 2015-09-17 ENCOUNTER — Encounter: Payer: Self-pay | Admitting: Allergy and Immunology

## 2015-09-17 ENCOUNTER — Ambulatory Visit (INDEPENDENT_AMBULATORY_CARE_PROVIDER_SITE_OTHER): Payer: Medicare Other | Admitting: Allergy and Immunology

## 2015-09-17 VITALS — BP 128/70 | HR 56 | Resp 20

## 2015-09-17 DIAGNOSIS — J3089 Other allergic rhinitis: Secondary | ICD-10-CM

## 2015-09-17 DIAGNOSIS — J387 Other diseases of larynx: Secondary | ICD-10-CM

## 2015-09-17 DIAGNOSIS — K219 Gastro-esophageal reflux disease without esophagitis: Secondary | ICD-10-CM

## 2015-09-17 DIAGNOSIS — J4530 Mild persistent asthma, uncomplicated: Secondary | ICD-10-CM | POA: Diagnosis not present

## 2015-09-17 MED ORDER — BECLOMETHASONE DIPROPIONATE 80 MCG/ACT IN AERS: INHALATION_SPRAY | RESPIRATORY_TRACT | Status: DC

## 2015-09-17 MED ORDER — ALBUTEROL SULFATE HFA 108 (90 BASE) MCG/ACT IN AERS: INHALATION_SPRAY | RESPIRATORY_TRACT | Status: DC

## 2015-09-17 MED ORDER — AZELASTINE-FLUTICASONE 137-50 MCG/ACT NA SUSP: NASAL | Status: DC

## 2015-09-17 NOTE — Patient Instructions (Addendum)
  1. Continue action plan for asthma flare including Qvar 80 3 inhalations 3 times a day  2. Continue Dymista one spray each nostril one-2 times per day  3. Continue pantoprazole 40 mg daily  4. Continue ProAir HFA and antihistamine if needed  5. Return to clinic in 6 months or earlier if problem

## 2015-09-17 NOTE — Progress Notes (Signed)
Follow-up Note  Referring Provider: Chilton Greathouse, MD Primary Provider: Hoyle Sauer, MD Date of Office Visit: 09/17/2015  Subjective:   Jorge Cherry (DOB: 07/08/1935) is a 80 y.o. male who [facility] in re-evaluation of the following:  HPI: Jorge Cherry presents to this clinic in reevaluation of his asthma and allergic rhinoconjunctivitis and LPR. I have not seen him in this clinic since May 2016.  He is done quite well regarding his asthma during the interval. He has not had a flareup of his asthma requiring a systemic steroid and he rarely uses any short acting bronchodilator and can exert himself without much difficulty. He has no nocturnal bronchospastic symptoms. He is slowly taper himself off all Qvar use and now relies on the use of Qvar as an action plan when he does develop some congestion in his chest. It does sound as though he's had only one flareup requiring him to activate this plan in February that lasted about 2-3 days and was associated with some rhinitis.  His nose is been doing quite well as long as he continues to use Dymista. He will definitely get congested if he stops this medication.  His reflux is been under excellent control while using his proton pump inhibitor.    Medication List           albuterol 108 (90 Base) MCG/ACT inhaler  Commonly known as:  PROAIR HFA  INHALE TWO PUFFS EVERY 4-6 HOURS AS NEEDED FOR COUGH OR WHEEZE     aspirin 81 MG tablet  Take 1 tablet (81 mg total) by mouth daily.     atorvastatin 40 MG tablet  Commonly known as:  LIPITOR  Take 20 mg by mouth daily.     beclomethasone 80 MCG/ACT inhaler  Commonly known as:  QVAR  INHALE ONE PUFF ONCE DAILY TO PREVENT COUGH OR WHEEZE. RINSE, GARGLE, AND SPIT AFTER USE.     clopidogrel 75 MG tablet  Commonly known as:  PLAVIX  TAKE 1 TABLET (75 MG TOTAL) BY MOUTH DAILY.     diltiazem 240 MG 24 hr capsule  Commonly known as:  CARDIZEM CD   TAKE 1 CAPSULE BY MOUTH DAILY.     DYMISTA 137-50 MCG/ACT Susp  Generic drug:  Azelastine-Fluticasone  USE ONE SPRAY IN EACH NOSTRIL TWICE DAILY FOR STUFFY NOSE OR DRAINAGE.     fenofibrate 145 MG tablet  Commonly known as:  TRICOR  Take 0.5 tablets (72.5 mg total) by mouth daily.     fish oil-omega-3 fatty acids 1000 MG capsule  Take 2 capsules (2 g total) by mouth 2 (two) times daily.     hydrALAZINE 25 MG tablet  Commonly known as:  APRESOLINE  Take 1 tablet (25 mg total) by mouth 3 (three) times daily.     loratadine 10 MG tablet  Commonly known as:  CLARITIN  Take 10 mg by mouth daily.     metoprolol succinate 100 MG 24 hr tablet  Commonly known as:  TOPROL-XL  Take 1 tablet (100 mg total) by mouth daily. Take with or immediately following a meal.     nitroGLYCERIN 0.4 MG/SPRAY spray  Commonly known as:  NITROLINGUAL  Place 1 spray under the tongue every 5 (five) minutes x 3 doses as needed for chest pain.     pantoprazole 40 MG tablet  Commonly known as:  PROTONIX  Take 1 tablet (40 mg total) by mouth 2 (two) times daily.  spironolactone 25 MG tablet  Commonly known as:  ALDACTONE  TAKE 1/2 TABLET (12.5 MG TOTAL) BY MOUTH DAILY.     telmisartan 80 MG tablet  Commonly known as:  MICARDIS  Take 80 mg by mouth daily.     torsemide 20 MG tablet  Commonly known as:  DEMADEX  Take 1 tablet (20 mg total) by mouth daily.     vitamin C 500 MG tablet  Commonly known as:  ASCORBIC ACID  Take 500 mg by mouth daily.     VITAMIN D-3 PO  Take 750 Units by mouth daily.     vitamin E 400 UNIT capsule  Take 400 Units by mouth daily.     ZETIA 10 MG tablet  Generic drug:  ezetimibe  TAKE 1 TABLET (10 MG TOTAL) BY MOUTH DAILY.     Zinc 50 MG Caps  Take 1 capsule by mouth 2 (two) times a week.        Past Medical History  Diagnosis Date  . Aortic valve disorder     2D ECHO, 10/19/2011 - EF >55%, LA mild-moderately dilated, mild-moderate tricuspid  regurgitation, mild-moderate valvular aortic stenosis, moderate calcification of aortic valve leaflets  . Atherosclerosis of renal artery (HCC)     RENAL DOPPLER, 12/10/2011 - Left renal artery demonstrated narrowing with elevated velocities consistent with a 1-59% diameter reduction  . Coronary artery disease   . Hypertension     Past Surgical History  Procedure Laterality Date  . Cardiac catheterization  02/27/2004    Coronary intervention and medical management  . Cardiac catheterization  03/04/2004    SVG supplying the diagonal vessel stented with a [id number] Taxus stent post dilated to 4.0 mm  . Coronary artery bypass graft    . Coronary stent placement    . Inguinal hernia repair Right   . Tonsillectomy and adenoidectomy    . Cataract extraction      Allergies  Allergen Reactions  . Altace [Ramipril] Other (See Comments)    Mouth swelling  . Contrast Media [Iodinated Diagnostic Agents]   . Mucinex [Guaifenesin Er] Hives    Review of systems negative except as noted in HPI / PMHx or noted below:  Review of Systems  Constitutional: Negative.   HENT: Negative.   Eyes: Negative.   Respiratory: Negative.   Cardiovascular: Negative.   Gastrointestinal: Negative.   Genitourinary: Negative.   Musculoskeletal: Negative.   Skin: Negative.   Neurological: Negative.   Endo/Heme/Allergies: Negative.   Psychiatric/Behavioral: Negative.      Objective:   Filed Vitals:   09/17/15 1112  BP: 128/70  Pulse: 56  Resp: 20          Physical Exam  Constitutional: He is well-developed, well-nourished, and in no distress.  HENT:  Head: Normocephalic.  Right Ear: Tympanic membrane, external ear and ear canal normal.  Left Ear: Tympanic membrane, external ear and ear canal normal.  Nose: Nose normal. No mucosal edema or rhinorrhea.  Mouth/Throat: Uvula is midline, oropharynx is clear and moist and mucous membranes are normal. No oropharyngeal exudate.  Right hearing aid    Eyes: Conjunctivae are normal.  Neck: Trachea normal. No tracheal tenderness present. No tracheal deviation present. No thyromegaly present.  Cardiovascular: Normal rate, regular rhythm, S1 normal and S2 normal.   Murmur (Systolic murmur) heard. Pulmonary/Chest: Breath sounds normal. No stridor. No respiratory distress. He has no wheezes. He has no rales.  Musculoskeletal: He exhibits no edema.  Lymphadenopathy:  Head (right side): No tonsillar adenopathy present.       Head (left side): No tonsillar adenopathy present.    He has no cervical adenopathy.  Neurological: He is alert. Gait normal.  Skin: No rash noted. He is not diaphoretic. No erythema. Nails show no clubbing.  Psychiatric: Mood and affect normal.    Diagnostics:    Spirometry was performed and demonstrated an FEV1 of 2.46 at 86 % of predicted.  The patient had an Asthma Control Test with the following results: ACT Total Score: 20.    Assessment and Plan:   1. Mild persistent asthma, uncomplicated   2. Allergic rhinitis   3. LPRD (laryngopharyngeal reflux disease)     1. Continue action plan for asthma flare including Qvar 80 3 inhalations 3 times a day  2. Continue Dymista one spray each nostril one-2 times per day  3. Continue pantoprazole 40 mg daily  4. Continue ProAir HFA and antihistamine if needed  5. Return to clinic in 6 months or earlier if problem  Darral has done quite well on his current medical therapy and I see no need for changing his treatment at this point. It does not appear as though he will require Qvar on a regular basis and he will rely on the use of an action plan including high-dose Qvar if he develops exacerbations of his asthma in the future. He will continue with his nasal steroid and proton pump inhibitor to address his upper airway inflammation and reflux with LPR respectively. I'll see him back in this clinic in 6 months or earlier if there is a problem.  Laurette Schimke, MD [facility]

## 2015-09-18 ENCOUNTER — Other Ambulatory Visit: Payer: Self-pay | Admitting: *Deleted

## 2015-09-18 MED ORDER — FLUTICASONE PROPIONATE HFA 110 MCG/ACT IN AERO: INHALATION_SPRAY | RESPIRATORY_TRACT | Status: DC

## 2015-09-18 NOTE — Telephone Encounter (Signed)
QVAR NO LONGER ON FORMULARY CHANGED RX TO FLOVENT PER DR Lucie LeatherKOZLOW

## 2015-10-14 ENCOUNTER — Other Ambulatory Visit: Payer: Self-pay | Admitting: Cardiovascular Disease

## 2015-10-14 NOTE — Telephone Encounter (Signed)
Rx(s) sent to pharmacy electronically.  

## 2015-10-22 ENCOUNTER — Other Ambulatory Visit: Payer: Self-pay | Admitting: Cardiovascular Disease

## 2015-11-08 ENCOUNTER — Telehealth: Payer: Self-pay | Admitting: Cardiovascular Disease

## 2015-11-08 NOTE — Telephone Encounter (Signed)
11/08/2015 [facility] with Dr. Tresa EndoKelly on 01/10/2016.  [facility]

## 2015-11-20 ENCOUNTER — Observation Stay (HOSPITAL_COMMUNITY)
Admission: EM | Admit: 2015-11-20 | Discharge: 2015-11-23 | Disposition: A | Discharge status: Home / Self Care | Payer: Medicare Other | Attending: Interventional Cardiology | Admitting: Interventional Cardiology

## 2015-11-20 ENCOUNTER — Telehealth: Payer: Self-pay | Admitting: Cardiovascular Disease

## 2015-11-20 ENCOUNTER — Encounter (HOSPITAL_COMMUNITY): Payer: Self-pay

## 2015-11-20 ENCOUNTER — Emergency Department (HOSPITAL_COMMUNITY): Payer: Medicare Other

## 2015-11-20 ENCOUNTER — Other Ambulatory Visit (HOSPITAL_COMMUNITY): Payer: Medicare Other

## 2015-11-20 DIAGNOSIS — I119 Hypertensive heart disease without heart failure: Secondary | ICD-10-CM | POA: Diagnosis present

## 2015-11-20 DIAGNOSIS — N184 Chronic kidney disease, stage 4 (severe): Secondary | ICD-10-CM | POA: Diagnosis not present

## 2015-11-20 DIAGNOSIS — I2510 Atherosclerotic heart disease of native coronary artery without angina pectoris: Secondary | ICD-10-CM | POA: Diagnosis not present

## 2015-11-20 DIAGNOSIS — Z79899 Other long term (current) drug therapy: Secondary | ICD-10-CM | POA: Diagnosis not present

## 2015-11-20 DIAGNOSIS — Z6831 Body mass index (BMI) 31.0-31.9, adult: Secondary | ICD-10-CM | POA: Diagnosis not present

## 2015-11-20 DIAGNOSIS — E668 Other obesity: Secondary | ICD-10-CM | POA: Diagnosis not present

## 2015-11-20 DIAGNOSIS — E782 Mixed hyperlipidemia: Secondary | ICD-10-CM | POA: Diagnosis not present

## 2015-11-20 DIAGNOSIS — I350 Nonrheumatic aortic (valve) stenosis: Secondary | ICD-10-CM | POA: Diagnosis not present

## 2015-11-20 DIAGNOSIS — R0789 Other chest pain: Secondary | ICD-10-CM | POA: Diagnosis not present

## 2015-11-20 DIAGNOSIS — Z7951 Long term (current) use of inhaled steroids: Secondary | ICD-10-CM | POA: Diagnosis not present

## 2015-11-20 DIAGNOSIS — I5033 Acute on chronic diastolic (congestive) heart failure: Secondary | ICD-10-CM | POA: Insufficient documentation

## 2015-11-20 DIAGNOSIS — Z7902 Long term (current) use of antithrombotics/antiplatelets: Secondary | ICD-10-CM | POA: Insufficient documentation

## 2015-11-20 DIAGNOSIS — I130 Hypertensive heart and chronic kidney disease with heart failure and stage 1 through stage 4 chronic kidney disease, or unspecified chronic kidney disease: Secondary | ICD-10-CM | POA: Insufficient documentation

## 2015-11-20 DIAGNOSIS — E785 Hyperlipidemia, unspecified: Secondary | ICD-10-CM | POA: Diagnosis present

## 2015-11-20 DIAGNOSIS — Z951 Presence of aortocoronary bypass graft: Secondary | ICD-10-CM | POA: Diagnosis not present

## 2015-11-20 DIAGNOSIS — I480 Paroxysmal atrial fibrillation: Secondary | ICD-10-CM | POA: Diagnosis not present

## 2015-11-20 DIAGNOSIS — Z955 Presence of coronary angioplasty implant and graft: Secondary | ICD-10-CM | POA: Diagnosis not present

## 2015-11-20 DIAGNOSIS — R079 Chest pain, unspecified: Secondary | ICD-10-CM | POA: Diagnosis present

## 2015-11-20 DIAGNOSIS — Z7982 Long term (current) use of aspirin: Secondary | ICD-10-CM | POA: Insufficient documentation

## 2015-11-20 DIAGNOSIS — J4530 Mild persistent asthma, uncomplicated: Secondary | ICD-10-CM | POA: Insufficient documentation

## 2015-11-20 DIAGNOSIS — I959 Hypotension, unspecified: Secondary | ICD-10-CM | POA: Insufficient documentation

## 2015-11-20 DIAGNOSIS — R001 Bradycardia, unspecified: Principal | ICD-10-CM

## 2015-11-20 DIAGNOSIS — N183 Chronic kidney disease, stage 3 unspecified: Secondary | ICD-10-CM | POA: Diagnosis present

## 2015-11-20 DIAGNOSIS — N189 Chronic kidney disease, unspecified: Secondary | ICD-10-CM

## 2015-11-20 LAB — TSH: TSH: 2.649 u[IU]/mL (ref 0.350–4.500)

## 2015-11-20 LAB — BASIC METABOLIC PANEL
Anion gap: 7 (ref 5–15)
BUN: 29 mg/dL — AB (ref 6–20)
CALCIUM: 9.1 mg/dL (ref 8.9–10.3)
CHLORIDE: 114 mmol/L — AB (ref 101–111)
CO2: 20 mmol/L — ABNORMAL LOW (ref 22–32)
CREATININE: 2.23 mg/dL — AB (ref 0.61–1.24)
GFR, EST AFRICAN AMERICAN: 31 mL/min — AB (ref >60)
GFR, EST NON AFRICAN AMERICAN: 27 mL/min — AB (ref >60)
Glucose, Bld: 139 mg/dL — ABNORMAL HIGH (ref 65–99)
Potassium: 4.5 mmol/L (ref 3.5–5.1)
SODIUM: 141 mmol/L (ref 135–145)

## 2015-11-20 LAB — COMPREHENSIVE METABOLIC PANEL
ALBUMIN: 3.4 g/dL — AB (ref 3.5–5.0)
ALK PHOS: 39 U/L (ref 38–126)
ALT: 19 U/L (ref 17–63)
AST: 20 U/L (ref 15–41)
Anion gap: 6 (ref 5–15)
BUN: 28 mg/dL — ABNORMAL HIGH (ref 6–20)
CHLORIDE: 113 mmol/L — AB (ref 101–111)
CO2: 20 mmol/L — AB (ref 22–32)
Calcium: 9 mg/dL (ref 8.9–10.3)
Creatinine, Ser: 2.35 mg/dL — ABNORMAL HIGH (ref 0.61–1.24)
GFR calc non Af Amer: 25 mL/min — ABNORMAL LOW (ref >60)
GFR, EST AFRICAN AMERICAN: 29 mL/min — AB (ref >60)
GLUCOSE: 133 mg/dL — AB (ref 65–99)
POTASSIUM: 4.5 mmol/L (ref 3.5–5.1)
SODIUM: 139 mmol/L (ref 135–145)
Total Bilirubin: 0.6 mg/dL (ref 0.3–1.2)
Total Protein: 5.9 g/dL — ABNORMAL LOW (ref 6.5–8.1)

## 2015-11-20 LAB — TROPONIN I

## 2015-11-20 LAB — CBC WITH DIFFERENTIAL/PLATELET
BASOS PCT: 0 %
Basophils Absolute: 0 10*3/uL (ref 0.0–0.1)
EOS ABS: 0.3 10*3/uL (ref 0.0–0.7)
EOS PCT: 3 %
HCT: 40.6 % (ref 39.0–52.0)
HEMOGLOBIN: 13 g/dL (ref 13.0–17.0)
Lymphocytes Relative: 20 %
Lymphs Abs: 2 10*3/uL (ref 0.7–4.0)
MCH: 30.2 pg (ref 26.0–34.0)
MCHC: 32 g/dL (ref 30.0–36.0)
MCV: 94.4 fL (ref 78.0–100.0)
MONOS PCT: 15 %
Monocytes Absolute: 1.4 10*3/uL — ABNORMAL HIGH (ref 0.1–1.0)
NEUTROS PCT: 62 %
Neutro Abs: 6.2 10*3/uL (ref 1.7–7.7)
PLATELETS: 204 10*3/uL (ref 150–400)
RBC: 4.3 MIL/uL (ref 4.22–5.81)
RDW: 13.2 % (ref 11.5–15.5)
WBC: 9.9 10*3/uL (ref 4.0–10.5)

## 2015-11-20 LAB — I-STAT CHEM 8, ED
BUN: 31 mg/dL — ABNORMAL HIGH (ref 6–20)
CALCIUM ION: 1.21 mmol/L (ref 1.12–1.23)
CREATININE: 2.2 mg/dL — AB (ref 0.61–1.24)
Chloride: 110 mmol/L (ref 101–111)
GLUCOSE: 134 mg/dL — AB (ref 65–99)
HCT: 39 % (ref 39.0–52.0)
HEMOGLOBIN: 13.3 g/dL (ref 13.0–17.0)
POTASSIUM: 4.5 mmol/L (ref 3.5–5.1)
Sodium: 142 mmol/L (ref 135–145)
TCO2: 20 mmol/L (ref 0–100)

## 2015-11-20 LAB — I-STAT TROPONIN, ED: TROPONIN I, POC: 0.01 ng/mL (ref 0.00–0.08)

## 2015-11-20 LAB — MRSA PCR SCREENING: MRSA BY PCR: NEGATIVE

## 2015-11-20 LAB — BRAIN NATRIURETIC PEPTIDE: B NATRIURETIC PEPTIDE 5: 605.3 pg/mL — AB (ref 0.0–100.0)

## 2015-11-20 IMAGING — CR DG CHEST 1V PORT
1 series · 1 of 1 positions shown · non-contrast
Comparison: [DATE]

CLINICAL DATA: Chest pain and dizziness

EXAM:
PORTABLE CHEST 1 VIEW

[AP]
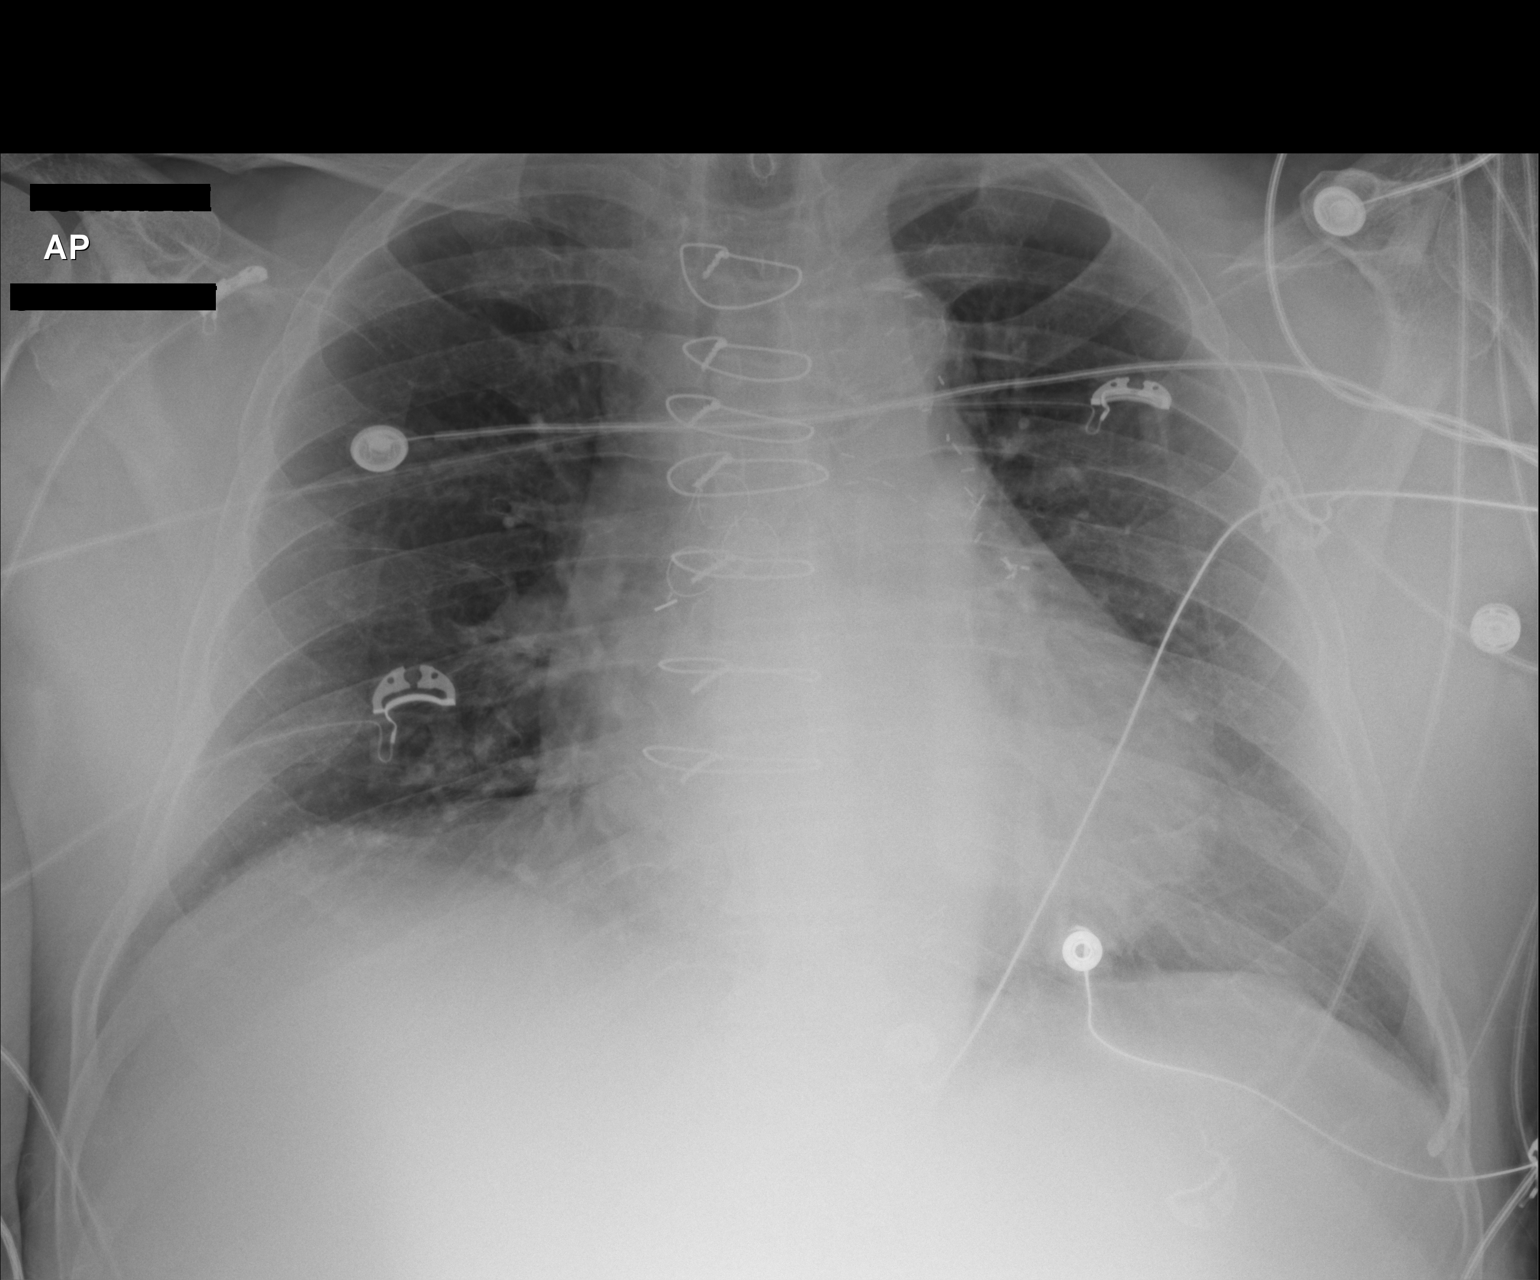

[1 of 1 positions shown; findings below may reference images not displayed]

FINDINGS: There is no edema or consolidation. Heart is borderline enlarged
with pulmonary vascularity within normal limits. There is
atherosclerotic calcification in the aortic arch. Patient is status
post coronary artery bypass grafting. No adenopathy. No bone
lesions.
IMPRESSION: No edema or consolidation. Aortic atherosclerosis. Patient is status
post coronary artery bypass grafting. Heart borderline enlarged.

## 2015-11-20 MED ORDER — DOPAMINE-DEXTROSE 1.6-5 MG/ML-% IV SOLN: 2.0000 ug/kg/min | Freq: Once | INTRAVENOUS | Status: DC

## 2015-11-20 MED ORDER — ONDANSETRON HCL 4 MG/2ML IJ SOLN
4.0000 mg | Freq: Four times a day (QID) | INTRAMUSCULAR | Status: DC | PRN
  Administered 2015-11-20: 4 mg via INTRAVENOUS
  Filled 2015-11-20: qty 2

## 2015-11-20 MED ORDER — LORATADINE 10 MG PO TABS
10.0000 mg | ORAL_TABLET | Freq: Every day | ORAL | Status: DC
  Administered 2015-11-21 – 2015-11-23 (×3): 10 mg via ORAL
  Filled 2015-11-20 (×3): qty 1

## 2015-11-20 MED ORDER — VITAMIN E 180 MG (400 UNIT) PO CAPS
400.0000 [IU] | ORAL_CAPSULE | Freq: Every day | ORAL | Status: DC
  Administered 2015-11-21 – 2015-11-23 (×3): 400 [IU] via ORAL
  Filled 2015-11-20 (×3): qty 1

## 2015-11-20 MED ORDER — FENOFIBRATE 54 MG PO TABS: 54.0000 mg | ORAL_TABLET | Freq: Every day | ORAL | Status: DC

## 2015-11-20 MED ORDER — CLOPIDOGREL BISULFATE 75 MG PO TABS
75.0000 mg | ORAL_TABLET | Freq: Every day | ORAL | Status: DC
  Administered 2015-11-20: 75 mg via ORAL
  Filled 2015-11-20: qty 1

## 2015-11-20 MED ORDER — EZETIMIBE 10 MG PO TABS
10.0000 mg | ORAL_TABLET | Freq: Every day | ORAL | Status: DC
  Administered 2015-11-21 – 2015-11-23 (×3): 10 mg via ORAL
  Filled 2015-11-20 (×3): qty 1

## 2015-11-20 MED ORDER — VITAMIN C 500 MG PO TABS
500.0000 mg | ORAL_TABLET | Freq: Every day | ORAL | Status: DC
  Administered 2015-11-21 – 2015-11-23 (×3): 500 mg via ORAL
  Filled 2015-11-20 (×3): qty 1

## 2015-11-20 MED ORDER — ATORVASTATIN CALCIUM 20 MG PO TABS
20.0000 mg | ORAL_TABLET | Freq: Every day | ORAL | Status: DC
  Administered 2015-11-20 – 2015-11-23 (×4): 20 mg via ORAL
  Filled 2015-11-20: qty 2
  Filled 2015-11-20 (×2): qty 1
  Filled 2015-11-20 (×2): qty 2

## 2015-11-20 MED ORDER — ASPIRIN EC 81 MG PO TBEC: 81.0000 mg | DELAYED_RELEASE_TABLET | Freq: Every day | ORAL | Status: DC

## 2015-11-20 MED ORDER — HYDRALAZINE HCL 25 MG PO TABS: 25.0000 mg | ORAL_TABLET | Freq: Three times a day (TID) | ORAL | Status: DC

## 2015-11-20 MED ORDER — HEPARIN (PORCINE) IN NACL 100-0.45 UNIT/ML-% IJ SOLN
1350.0000 [IU]/h | INTRAMUSCULAR | Status: DC
  Administered 2015-11-20 – 2015-11-21 (×2): 1350 [IU]/h via INTRAVENOUS
  Filled 2015-11-20 (×3): qty 250

## 2015-11-20 MED ORDER — HEPARIN BOLUS VIA INFUSION
5000.0000 [IU] | Freq: Once | INTRAVENOUS | Status: AC
  Administered 2015-11-20: 5000 [IU] via INTRAVENOUS
  Filled 2015-11-20: qty 5000

## 2015-11-20 MED ORDER — NITROGLYCERIN 0.4 MG/SPRAY TL SOLN
1.0000 | Status: DC | PRN
  Filled 2015-11-20: qty 4.9

## 2015-11-20 MED ORDER — OFF THE BEAT BOOK
Freq: Once | Status: AC
  Administered 2015-11-21: 08:00:00
  Filled 2015-11-20 (×2): qty 1

## 2015-11-20 MED ORDER — DOPAMINE-DEXTROSE 3.2-5 MG/ML-% IV SOLN
INTRAVENOUS | Status: AC
  Filled 2015-11-20: qty 250

## 2015-11-20 MED ORDER — DOPAMINE-DEXTROSE 3.2-5 MG/ML-% IV SOLN
0.0000 ug/kg/min | Freq: Once | INTRAVENOUS | Status: AC
  Administered 2015-11-20: 3 ug/kg/min via INTRAVENOUS

## 2015-11-20 MED ORDER — FENTANYL CITRATE (PF) 100 MCG/2ML IJ SOLN
INTRAMUSCULAR | Status: AC
  Filled 2015-11-20: qty 2

## 2015-11-20 MED ORDER — ATROPINE SULFATE 1 MG/ML IJ SOLN
0.5000 mg | Freq: Once | INTRAMUSCULAR | Status: AC
  Administered 2015-11-20: 0.5 mg via INTRAVENOUS

## 2015-11-20 MED ORDER — SODIUM CHLORIDE 0.9 % IV BOLUS (SEPSIS)
1000.0000 mL | Freq: Once | INTRAVENOUS | Status: AC
  Administered 2015-11-20: 1000 mL via INTRAVENOUS

## 2015-11-20 MED ORDER — ACETAMINOPHEN 325 MG PO TABS
650.0000 mg | ORAL_TABLET | ORAL | Status: DC | PRN
Start: 2015-11-20 — End: 2015-11-23
  Filled 2015-11-20: qty 2

## 2015-11-20 MED ORDER — PANTOPRAZOLE SODIUM 40 MG PO TBEC
40.0000 mg | DELAYED_RELEASE_TABLET | Freq: Two times a day (BID) | ORAL | Status: DC
Start: 2015-11-20 — End: 2015-11-23
  Administered 2015-11-20 – 2015-11-23 (×6): 40 mg via ORAL
  Filled 2015-11-20 (×6): qty 1

## 2015-11-20 MED ORDER — CETYLPYRIDINIUM CHLORIDE 0.05 % MT LIQD
7.0000 mL | Freq: Two times a day (BID) | OROMUCOSAL | Status: DC
  Administered 2015-11-21 (×2): 7 mL via OROMUCOSAL

## 2015-11-20 MED FILL — Medication: Qty: 1 | Status: AC

## 2015-11-20 NOTE — Progress Notes (Signed)
ANTICOAGULATION CONSULT NOTE - Initial Consult  Pharmacy Consult for heparin Indication: atrial fibrillation  Allergies  Allergen Reactions  . Altace [Ramipril] Other (See Comments)    Mouth swelling  . Contrast Media [Iodinated Diagnostic Agents]   . Mucinex [Guaifenesin Er] Hives    Patient Measurements: Height: 5\' 9"  (175.3 cm) Weight: 215 lb (97.523 kg) IBW/kg (Calculated) : 70.7 Heparin Dosing Weight: 91 kg  Vital Signs: Temp: 97.5 F (36.4 C) (07/12 1202) Temp Source: Oral (07/12 1202) BP: 95/51 mmHg (07/12 1315) Pulse Rate: 32 (07/12 1315)  Labs:  Recent Labs  11/20/15 1220 11/20/15 1222  HGB 13.0 13.3  HCT 40.6 39.0  PLT 204  --   CREATININE 2.23* 2.20*    Estimated Creatinine Clearance: 32.4 mL/min (by C-G formula based on Cr of 2.2).  Assessment: 80 yo m presenting with junctional bradycardia on BB CCB at home  PMH: CABG 1994, CAD   AC: none pta on iv hep now for new onset afib  Renal: SCr 2.20  Heme: H&H 13.3/39, Plt 204  Goal of Therapy:  Heparin level 0.3-0.7 units/ml Monitor platelets by anticoagulation protocol: Yes   Plan:  Heparin bolus 5000 units Heparin gtt 1350 units/hr Initial level 2200 Daily HL, CBC  Isaac BlissMichael Maegan Buller, PharmD, BCPS, [facility] Clinical Pharmacist Pager [phone] 11/20/2015 2:30 PM

## 2015-11-20 NOTE — Progress Notes (Signed)
eLink Physician-Brief Progress Note Patient Name: Doylene BodeGene S Ord DOB: 11/19/1935 MRN: [id number]   Date of Service  11/20/2015  HPI/Events of Note  New patient evaluation.  eICU Interventions  Nothing to add.     Intervention Category Major Interventions: Other:  YACOUB,WESAM 11/20/2015, 4:26 PM

## 2015-11-20 NOTE — H&P (Addendum)
Jorge Cherry is a 80 y.o. male  Admit Date: 11/20/2015 Referring Physician: Emergency department Primary Cardiologist:: Nile Dearhomas A. Kelley, M.D. Chief complaint / reason for admission: Profound junctional bradycardia  HPI: 80 year old gentleman followed by Dr. Daphene Jaegerom Kelly with a history of CAD dating back to 1994 at which time he underwent CABG revascularization surgery. In October 2003 he underwent stenting to the proximal portion of the vein graft supplying the diagonal vessel with a 3.5x16 mm Taxus DES stent post dilated to 4.0 mm. He has diffuse disease in the distal apical portion of the LAD beyond the LIMA insertion treated medically. He has documented mild aortic valve stenosis with grade 2 diastolic dysfunction with concentric left ventricular hypertrophy. He has documented renal cysts, history of hypertension, mixed hyperlipidemia. His last Myoview study was in June 2013 which showed a minimal apical defect. Post-stess ejection fraction was 56%.  The patient gives a several month history of exertional chest discomfort in the midsternal area. He has felt \\it  daily when he goes on his usual walk with his wife. This morning after awakening he felt fatigue, dizzy, and weak. He measured his blood pressure on several occasions and the systolic pressures were less than 90 mmHg. Because of the weakness and the low blood pressures he was brought to the emergency room by EMS. Lying flat in the emergency room his heart rate is 30 with a narrow complex escape rhythm and possible underlying atrial fibrillation. He denies chest discomfort, dyspnea, and other complaints.  He has recently noted lower extremity swelling and mild dyspnea on exertion. He states the lower extremity swelling began several months ago when his diuretic regimen was decreased.  1990 for coronary artery bypass grafting with LIMA to his LAD, SVG to his diagonal, saphenous vein to his marginal, and SVG to his right coronary  artery.  As noted above the saphenous vein graft to the diagonal was stented with a Taxus DES in 2005.  PMH:    Past Medical History  Diagnosis Date  . Aortic valve disorder     2D ECHO, 10/19/2011 - EF >55%, LA mild-moderately dilated, mild-moderate tricuspid regurgitation, mild-moderate valvular aortic stenosis, moderate calcification of aortic valve leaflets  . Atherosclerosis of renal artery (HCC)     RENAL DOPPLER, 12/10/2011 - Left renal artery demonstrated narrowing with elevated velocities consistent with a 1-59% diameter reduction  . Coronary artery disease   . Hypertension     PSH:    Past Surgical History  Procedure Laterality Date  . Cardiac catheterization  02/27/2004    Coronary intervention and medical management  . Cardiac catheterization  03/04/2004    SVG supplying the diagonal vessel stented with a [id number] Taxus stent post dilated to 4.0 mm  . Coronary artery bypass graft    . Coronary stent placement    . Inguinal hernia repair Right   . Tonsillectomy and adenoidectomy    . Cataract extraction     ALLERGIES:   Altace; Contrast media; and Mucinex Prior to Admit Meds:   (Not in a hospital admission) Family HX:    Family History  Problem Relation Age of Onset  . Cancer Mother   . Heart disease Father     died age 78  . Stroke Maternal Grandmother   . Cancer Maternal Grandfather    Social HX:    Social History   Social History  . Marital Status: Married    Spouse Name: N/A  . Number of Children: 2  .  Years of Education: N/A   Occupational History  . Not on file.   Social History Main Topics  . Smoking status: Never Smoker   . Smokeless tobacco: Not on file  . Alcohol Use: No  . Drug Use: No  . Sexual Activity: Not on file   Other Topics Concern  . Not on file   Social History Narrative   Lives at home with wife.  Retired from [employer].       ROS Lower extremity swelling and dyspnea on exertion but without orthopnea. He  denies syncope. No palpitations. All other systems are negative.  Physical Exam: Blood pressure 90/60, pulse 34, temperature 97.5 F (36.4 C), temperature source Oral, resp. rate 14, height  (1.753 m), weight 215 lb (97.523 kg), SpO2 100 %.    Moderately obese relatively well appearing gentleman with normal skin color, warm and dry. HEENT exam is unremarkable. No jaundice is noted. Neck exam reveals no JVD or carotid bruits. Patient is lying relatively flat. Chest reveals clear lung fields posteriorly. Cardiac exam reveals a very slow slightly irregular rhythm. Systolic murmur is heard at the right upper sternal border. No diastolic murmurs heard. Abdomen is soft. Bowel sounds are normal. Extremities reveal 2+ bilateral edema from ankles to the knees. Left radial pulses intact right radial pulse is diminished in intensity. I'm unable to feel pedal pulses. Neurological exam reveals patient is alert, oriented, and without focal neurological abnormality.  Labs: Lab Results  Component Value Date   WBC 9.9 11/20/2015   HGB 13.3 11/20/2015   HCT 39.0 11/20/2015   MCV 94.4 11/20/2015   PLT 204 11/20/2015    Recent Labs Lab 11/20/15 1220 11/20/15 1222  NA 141 142  K 4.5 4.5  CL 114* 110  CO2 20*  --   BUN 29* 31*  CREATININE 2.23* 2.20*  CALCIUM 9.1  --   GLUCOSE 139* 134*   Lab Results  Component Value Date   TROPONINI 0.52* 01/22/2015     Radiology:  Dg Chest Port 1 View  11/20/2015  CLINICAL DATA:  Chest pain and dizziness EXAM: PORTABLE CHEST 1 VIEW COMPARISON:  January 22, 2015 FINDINGS: There is no edema or consolidation. Heart is borderline enlarged with pulmonary vascularity within normal limits. There is atherosclerotic calcification in the aortic arch. Patient is status post coronary artery bypass grafting. No adenopathy. No bone lesions. IMPRESSION: No edema or consolidation. Aortic atherosclerosis. Patient is status post coronary artery bypass grafting. Heart  borderline enlarged. Electronically Signed   By: Bretta Bang III M.D.   On: 11/20/2015 12:51    EKG:  Atrial fibrillation with marked slowing of ventricular response with average heart rate of 30 bpm. No acute ST-T wave changes noted.  ASSESSMENT:  1. Atrial fibrillation with profoundly slow ventricular response at rate of 30 bpm. Bradycardia is aggravated by concomitant combined diltiazem and beta blocker therapy in the setting of CKD. 2. Coronary artery disease with prior coronary bypass grafting and additional graft stenting in 1995 and 2003 respectively. The patient has been having daily exertional angina for the past several months. 3. Stage IV chronic kidney disease with creatinine of 2.5 4. Acute on chronic diastolic heart failure with volume overload 5. Mild to moderate aortic stenosis 6. Hypotension 7.  New onset atrial fibrillation, suspect acute. CHADS VASC is 5.  Plan:  1. IV dopamine was started to improve blood pressure and hopefully increase heart rate. 2. Hold beta blocker therapy and diltiazem therapy. 3.  Consider temporary transvenous pacemaker. For the time being transcutaneous external pacer leads have been placed. Hospitalized in CCU. 4. Serial cardiac markers to exclude acute myocardial infarction 5. May require permanent pacemaker heart rate does not improve off beta blocker therapy 6. Will likely require cardiac catheterization once bradycardia has been resolved. 7. EP consultation if persistent bradycardia 8. IV heparin  Lyn Records III 11/20/2015 1:16 PM

## 2015-11-20 NOTE — ED Notes (Signed)
Dr. Smith at bedside.

## 2015-11-20 NOTE — ED Notes (Signed)
Report given 2 Heart RN

## 2015-11-20 NOTE — Consult Note (Signed)
ELECTROPHYSIOLOGY CONSULT NOTE    Patient ID: Jorge BodeGene S Cherry MRN: [id number], DOB/AGE: 78/10/1935 78 y.o.  Admit date: 11/20/2015 Date of Consult: 11/20/2015  Primary Physician: Hoyle SauerAVVA,RAVISANKAR R, MD Primary Cardiologist: Dr. Tresa EndoKelly Requesting MD: Dr. Katrinka BlazingSmith  Reason for Consultation: bradycardia  HPI: Jorge Saundra ShellingS Cherry is a 80 y.o. male with PMHx of CAD (CABG 1994, PCI to SVG 2003), HTN, HLD, obesity, VHF with mild-mod AS, CRI stage III was brought to the ER by his wife with unusual dizzy spells/ weakness.  He reports a long history of what sounds like orthostatic dizziness but today was different since he was laying down, became very weak, lightheaded and very dizzy.  He did not have particular CP today though states in the last couple months he has been getting an aching/burning in the centr of his chest only with exertion that reminds him of his symptom prior to his CABG.  He denies syncope, no palpitations, no overt SOB.  He was observed to be in AFib with HR 30's and started on dopamine with hypotension, noting BP charted as low as 65/54.   Since the dopamine was started the patient states he is feeling better without active symptoms at this time at all.  BP improved to 109/62, HR remains 30-35bpm.  He was last seen by Dr. Tresa EndoKelly Dec 2016, at that time reports of labile HTN and adjustments in his BP meds/diuretics due to his kidney function as well were made. He had a hospital stay with CP in September 2016 with low risk stress test with an inferolateral wall defect Dr. Tresa EndoKelly reports is unchanged from previous.  LABS: K+ 4.5 BUN/Creat 29/2.23 >> 28/2.35  (baseline creat appears to be 1.5-1.7 range) poc Trop 0.01 Trop I: <0.03 H/H 13/40 WBC 9.9 TSH 2.649  HOME MEDS include : Toprol XL 100mg  daily and diltiazem CD 240mg  daily -- last dose of both about 23:45 on 11/19/15 ("right before midnight")   Past Medical History  Diagnosis Date  . Aortic valve disorder     2D ECHO, 10/19/2011 - EF  >55%, LA mild-moderately dilated, mild-moderate tricuspid regurgitation, mild-moderate valvular aortic stenosis, moderate calcification of aortic valve leaflets  . Atherosclerosis of renal artery (HCC)     RENAL DOPPLER, 12/10/2011 - Left renal artery demonstrated narrowing with elevated velocities consistent with a 1-59% diameter reduction  . Coronary artery disease   . Hypertension      Surgical History:  Past Surgical History  Procedure Laterality Date  . Cardiac catheterization  02/27/2004    Coronary intervention and medical management  . Cardiac catheterization  03/04/2004    SVG supplying the diagonal vessel stented with a [id number] Taxus stent post dilated to 4.0 mm  . Coronary artery bypass graft    . Coronary stent placement    . Inguinal hernia repair Right   . Tonsillectomy and adenoidectomy    . Cataract extraction        (Not in a hospital admission)  Inpatient Medications:  . aspirin  81 mg Oral Daily  . atorvastatin  20 mg Oral Daily  . clopidogrel  75 mg Oral Daily  . ezetimibe  10 mg Oral Daily  . fenofibrate  54 mg Oral Daily  . heparin  5,000 Units Intravenous Once  . hydrALAZINE  25 mg Oral TID  . loratadine  10 mg Oral Daily  . pantoprazole  40 mg Oral BID  . vitamin C  500 mg Oral Daily  . vitamin E  400 Units  Oral Daily    Allergies:  Allergies  Allergen Reactions  . Altace [Ramipril] Other (See Comments)    Mouth swelling  . Contrast Media [Iodinated Diagnostic Agents]   . Mucinex [Guaifenesin Er] Hives    Social History   Social History  . Marital Status: Married    Spouse Name: N/A  . Number of Children: 2  . Years of Education: N/A   Occupational History  . Not on file.   Social History Main Topics  . Smoking status: Never Smoker   . Smokeless tobacco: Not on file  . Alcohol Use: No  . Drug Use: No  . Sexual Activity: Not on file   Other Topics Concern  . Not on file   Social History Narrative   Lives at home with wife.   Retired from [employer].       Family History  Problem Relation Age of Onset  . Cancer Mother   . Heart disease Father     died age 31  . Stroke Maternal Grandmother   . Cancer Maternal Grandfather      Review of Systems: All other systems reviewed and are otherwise negative except as noted above.  Physical Exam: Filed Vitals:   11/20/15 1400 11/20/15 1415 11/20/15 1430 11/20/15 1445  BP: 109/62 105/77 117/96 102/55  Pulse: 34 31 32 32  Temp:      TempSrc:      Resp: Height:      Weight:      SpO2: 90% 89% 91% 93%    GEN- The patient appears in NAD, alert and oriented x 3 today.   HEENT: normocephalic, atraumatic; sclera clear, conjunctiva pink; hearing intact; oropharynx clear; neck supple Lymph- no cervical lymphadenopathy Lungs- Clear to ausculation bilaterally, normal work of breathing.  No wheezes, rales, rhonchi Heart- Irregular rate and rhythm, bradycardic, 2/6 SM, no rubs or gallops, PMI not laterally displaced GI- soft, non-tender, non-distended Extremities- no clubbing, cyanosis, 1++ edema L>R (pt reports chronically RLE swells more then the left) MS- no significant deformity or atrophy Skin- warm and dry, no rash or lesion Psych- euthymic mood, full affect Neuro- no gross deficits observed  Labs:   Lab Results  Component Value Date   WBC 9.9 11/20/2015   HGB 13.3 11/20/2015   HCT 39.0 11/20/2015   MCV 94.4 11/20/2015   PLT 204 11/20/2015    Recent Labs Lab 11/20/15 1420  NA 139  K 4.5  CL 113*  CO2 20*  BUN 28*  CREATININE 2.35*  CALCIUM 9.0  PROT 5.9*  BILITOT 0.6  ALKPHOS 39  ALT 19  AST 20  GLUCOSE 133*      Radiology/Studies:  Dg Chest Port 1 View 11/20/2015  CLINICAL DATA:  Chest pain and dizziness EXAM: PORTABLE CHEST 1 VIEW COMPARISON:  January 22, 2015 FINDINGS: There is no edema or consolidation. Heart is borderline enlarged with pulmonary vascularity within normal limits. There is atherosclerotic  calcification in the aortic arch. Patient is status post coronary artery bypass grafting. No adenopathy. No bone lesions. IMPRESSION: No edema or consolidation. Aortic atherosclerosis. Patient is status post coronary artery bypass grafting. Heart borderline enlarged. Electronically Signed   By: Bretta Bang III M.D.   On: 11/20/2015 12:51    EKG: AFib, 33bpm, ICRBBB TELEMETRY: Afib 30's  01/31/15: Echocardiogram Study Conclusions - Left ventricle: The cavity size was normal. Wall thickness was  increased in a pattern of mild LVH. Systolic function was normal.  The estimated ejection fraction was in the range of 55% to 60%.  Wall motion was normal; there were no regional wall motion  abnormalities. Features are consistent with a pseudonormal left  ventricular filling pattern, with concomitant abnormal relaxation  and increased filling pressure (grade 2 diastolic dysfunction). - Aortic valve: Trileaflet; severely calcified leaflets. Partial  fusion of non- and right coronary cusps. Visually, aortic  stenosis looked moderate but mild by mean gradient. Mean gradient  (S): 15 mm Hg. Peak gradient (S): 27 mm Hg. - Aorta: Mildly dilated aortic root. Aortic root dimension: 39 mm  (ED). - Mitral valve: Mildly calcified annulus. Mildly calcified leaflets  . There was no significant regurgitation. - Left atrium: The atrium was moderately dilated. 44mm - Right ventricle: The cavity size was mildly dilated. Systolic  function was normal. - Pulmonary arteries: PA peak pressure: 29 mm Hg (S). - Inferior vena cava: The vessel was normal in size. The  respirophasic diameter changes were in the normal range (= 50%),  consistent with normal central venous pressure. Impressions: - Normal LV size with mild LV hypertrophy. EF 55-60%. Moderate  diastolic dysfunction. Calcified aortic valve with mild to  moderate stenosis (mild by mean gradient, moderate visually).  Mildly dilated  RV with normal systolic function. Moderate LAE.  01/22/15: Lexiscan s tress test  There was no ST segment deviation noted during stress.  This is a low risk study.  Findings consistent with ischemia.  The left ventricular ejection fraction is mildly decreased (45-54%).  Nuclear stress EF: 54%. Low risk stress nuclear study with a small, medium intensity, reversible defect in the distal inferior lateral wall and apex; findings consistent with mild ischemia; EF 54; mild LVE.   Assessment and Plan:   1. Symptomatic bradycardia      Last dose of his Metoprolol and diltiazem about 23:45 last evening      Will need to wash out      Currently on dopamine with resolution of symptoms and improved BP, HR remains in 30's      Pacer pads on the patient/external pacer at bedside  2. AFib, new for the patient     CHA2DS2Vasc is at least 4, started on a heparin gtt  3. CP (not active)     Cardiology on case, so far negative Trop     On ASA, plavix, statin  4. CRI     Worse today likely secondary to hypotension, bradycardia     Follow closely     BP better on dopamine  5. Edema, fluid OL     Pt reports some at baseline, more with last reduction in his diuretic out patient     C/w primary cardiology        Signed, Jorge Dowse, PA-C 11/20/2015 3:30 PM  EP Attending  Patient seen and examined. Agree with above. Exam reveals a well appearing man, with an IRIR bradycardia and clear lungsThe patient has high grade AV block and hypotension, likely both due to excess AV nodal blocking drugs. He has been placed on dopamine and improved. His atrial fib appears to be new. He will be observed on Tele with back up dopamine. If HR does not improve after washout of his AV blocking drugs, then he may need PPM. I suspect it improves with cessation of his AV nodal blocking drugs.  Lewayne Bunting, M.D.

## 2015-11-20 NOTE — ED Provider Notes (Signed)
CSN: [id number]     Arrival date & time 11/20/15  1155 History   First MD Initiated Contact with Patient 11/20/15 1206     Chief Complaint  Patient presents with  . Dizziness  . Chest Pain     The history is provided by the patient. No language interpreter was used.   Jorge Cherry is a 80 y.o. male who presents to the Emergency Department complaining of dizziness.  He reports intermittent dizziness over the last week, severe today. Yesterday he had some light chest burning, now resolved. He denies any fevers, shortness of breath, abdominal pain, vomiting, diarrhea, leg swelling. Sxs are severe, constant, worsening.  Past Medical History  Diagnosis Date  . Aortic valve disorder     2D ECHO, 10/19/2011 - EF >55%, LA mild-moderately dilated, mild-moderate tricuspid regurgitation, mild-moderate valvular aortic stenosis, moderate calcification of aortic valve leaflets  . Atherosclerosis of renal artery (HCC)     RENAL DOPPLER, 12/10/2011 - Left renal artery demonstrated narrowing with elevated velocities consistent with a 1-59% diameter reduction  . Coronary artery disease   . Hypertension    Past Surgical History  Procedure Laterality Date  . Cardiac catheterization  02/27/2004    Coronary intervention and medical management  . Cardiac catheterization  03/04/2004    SVG supplying the diagonal vessel stented with a 3.5x41mm Taxus stent post dilated to 4.0 mm  . Coronary artery bypass graft    . Coronary stent placement    . Inguinal hernia repair Right   . Tonsillectomy and adenoidectomy    . Cataract extraction     Family History  Problem Relation Age of Onset  . Cancer Mother   . Heart disease Father     died age 26  . Stroke Maternal Grandmother   . Cancer Maternal Grandfather    Social History  Substance Use Topics  . Smoking status: Never Smoker   . Smokeless tobacco: None  . Alcohol Use: No    Review of Systems  All other systems reviewed and are  negative.     Allergies  Altace; Mucinex; and Contrast media  Home Medications   Prior to Admission medications   Medication Sig Start Date End Date Taking? Authorizing Provider  albuterol (PROAIR HFA) 108 (90 Base) MCG/ACT inhaler INHALE TWO PUFFS EVERY 4-6 HOURS AS NEEDED FOR COUGH OR WHEEZE 09/17/15  Yes Jessica Priest, MD  aspirin 81 MG tablet Take 1 tablet (81 mg total) by mouth daily. 10/08/14  Yes Lonia Blood, MD  atorvastatin (LIPITOR) 40 MG tablet Take 20 mg by mouth daily.   Yes Historical Provider, MD  Azelastine-Fluticasone (DYMISTA) 137-50 MCG/ACT SUSP USE ONE SPRAY IN EACH NOSTRIL TWICE DAILY FOR STUFFY NOSE OR DRAINAGE. 09/17/15  Yes Jessica Priest, MD  beclomethasone (QVAR) 40 MCG/ACT inhaler Inhale 1 puff into the lungs daily.   Yes Historical Provider, MD  Cholecalciferol (VITAMIN D-3 PO) Take 1,000 Units by mouth daily.    Yes Historical Provider, MD  clopidogrel (PLAVIX) 75 MG tablet TAKE 1 TABLET (75 MG TOTAL) BY MOUTH DAILY. 05/14/15  Yes Lennette Bihari, MD  diltiazem (CARDIZEM CD) 240 MG 24 hr capsule TAKE 1 CAPSULE BY MOUTH DAILY. 07/08/15  Yes Lennette Bihari, MD  ezetimibe (ZETIA) 10 MG tablet TAKE 1 TABLET (10 MG TOTAL) BY MOUTH DAILY. 10/23/15  Yes Lennette Bihari, MD  fenofibrate (TRICOR) 145 MG tablet TAKE 1 TABLET (145 MG TOTAL) BY MOUTH DAILY. 10/14/15  Yes Maisie Fus A  Tresa Endo, MD  fish oil-omega-3 fatty acids 1000 MG capsule Take 2 capsules (2 g total) by mouth 2 (two) times daily. 10/25/12  Yes Lennette Bihari, MD  hydrALAZINE (APRESOLINE) 25 MG tablet Take 1 tablet (25 mg total) by mouth 3 (three) times daily. 05/03/15  Yes Lennette Bihari, MD  loratadine (CLARITIN) 10 MG tablet Take 10 mg by mouth daily.   Yes Historical Provider, MD  metoprolol succinate (TOPROL-XL) 100 MG 24 hr tablet Take 1 tablet (100 mg total) by mouth daily. Take with or immediately following a meal. 05/27/15  Yes Lennette Bihari, MD  nitroGLYCERIN (NITROLINGUAL) 0.4 MG/SPRAY spray Place 1 spray under  the tongue every 5 (five) minutes x 3 doses as needed for chest pain. 10/03/14  Yes Lennette Bihari, MD  pantoprazole (PROTONIX) 40 MG tablet Take 1 tablet (40 mg total) by mouth 2 (two) times daily. Patient taking differently: Take 80 mg by mouth every evening.  03/21/15  Yes Jessica Priest, MD  spironolactone (ALDACTONE) 25 MG tablet TAKE 1/2 TABLET (12.5 MG TOTAL) BY MOUTH DAILY. 06/27/15  Yes Lennette Bihari, MD  telmisartan (MICARDIS) 80 MG tablet Take 80 mg by mouth daily.   Yes Historical Provider, MD  torsemide (DEMADEX) 20 MG tablet Take 1 tablet (20 mg total) by mouth daily. Patient taking differently: Take 10 mg by mouth daily.  02/05/15  Yes Lennette Bihari, MD  vitamin C (ASCORBIC ACID) 500 MG tablet Take 500 mg by mouth daily.   Yes Historical Provider, MD  vitamin E 400 UNIT capsule Take 400 Units by mouth daily.   Yes Historical Provider, MD  zinc gluconate 50 MG tablet Take 50 mg by mouth 2 (two) times a week.   Yes Historical Provider, MD  fluticasone (FLOVENT HFA) 110 MCG/ACT inhaler Use 3 puffs 3 times daily as directed for asthma flareup 09/18/15   Jessica Priest, MD   BP 102/55 mmHg  Pulse 32  Temp(Src) 97.5 F (36.4 C) (Axillary)  Resp 11  Ht 5\' 9"  (1.753 m)  Wt 215 lb (97.523 kg)  BMI 31.74 kg/m2  SpO2 93% Physical Exam  Constitutional: He is oriented to person, place, and time. He appears well-developed and well-nourished.  HENT:  Head: Normocephalic and atraumatic.  Cardiovascular: Regular rhythm.   No murmur heard. Bradycardic  Pulmonary/Chest: Effort normal and breath sounds normal. No respiratory distress.  Abdominal: Soft. There is no tenderness. There is no rebound and no guarding.  Musculoskeletal: He exhibits no edema or tenderness.  Neurological: He is alert and oriented to person, place, and time.  Skin: Skin is warm and dry.  Psychiatric: He has a normal mood and affect. His behavior is normal.  Nursing note and vitals reviewed.   ED Course  Procedures  CRITICAL CARE Performed by: Tilden Fossa   Total critical care time: 35 minutes  Critical care time was exclusive of separately billable procedures and treating other patients.  Critical care was necessary to treat or prevent imminent or life-threatening deterioration.  Critical care was time spent personally by me on the following activities: development of treatment plan with patient and/or surrogate as well as nursing, discussions with consultants, evaluation of patient's response to treatment, examination of patient, obtaining history from patient or surrogate, ordering and performing treatments and interventions, ordering and review of laboratory studies, ordering and review of radiographic studies, pulse oximetry and re-evaluation of patient's condition.  Labs Review Labs Reviewed  CBC WITH DIFFERENTIAL/PLATELET - Abnormal; Notable for  the following:    Monocytes Absolute 1.4 (*)    All other components within normal limits  BASIC METABOLIC PANEL - Abnormal; Notable for the following:    Chloride 114 (*)    CO2 20 (*)    Glucose, Bld 139 (*)    BUN 29 (*)    Creatinine, Ser 2.23 (*)    GFR calc non Af Amer 27 (*)    GFR calc Af Amer 31 (*)    All other components within normal limits  COMPREHENSIVE METABOLIC PANEL - Abnormal; Notable for the following:    Chloride 113 (*)    CO2 20 (*)    Glucose, Bld 133 (*)    BUN 28 (*)    Creatinine, Ser 2.35 (*)    Total Protein 5.9 (*)    Albumin 3.4 (*)    GFR calc non Af Amer 25 (*)    GFR calc Af Amer 29 (*)    All other components within normal limits  BRAIN NATRIURETIC PEPTIDE - Abnormal; Notable for the following:    B Natriuretic Peptide 605.3 (*)    All other components within normal limits  I-STAT CHEM 8, ED - Abnormal; Notable for the following:    BUN 31 (*)    Creatinine, Ser 2.20 (*)    Glucose, Bld 134 (*)    All other components within normal limits  MRSA PCR SCREENING  TSH  TROPONIN I  TROPONIN I   TROPONIN I  HEPARIN LEVEL (UNFRACTIONATED)  I-STAT TROPOININ, ED    Imaging Review Dg Chest Port 1 View  11/20/2015  CLINICAL DATA:  Chest pain and dizziness EXAM: PORTABLE CHEST 1 VIEW COMPARISON:  January 22, 2015 FINDINGS: There is no edema or consolidation. Heart is borderline enlarged with pulmonary vascularity within normal limits. There is atherosclerotic calcification in the aortic arch. Patient is status post coronary artery bypass grafting. No adenopathy. No bone lesions. IMPRESSION: No edema or consolidation. Aortic atherosclerosis. Patient is status post coronary artery bypass grafting. Heart borderline enlarged. Electronically Signed   By: Bretta BangWilliam  Woodruff III M.D.   On: 11/20/2015 12:51   I have personally reviewed and evaluated these images and lab results as part of my medical decision-making.   EKG Interpretation   Date/Time:  Wednesday November 20 2015 12:10:09 EDT Ventricular Rate:  33 PR Interval:    QRS Duration: 144 QT Interval:  580 QTC Calculation: 430 R Axis:   -13 Text Interpretation:  Undetermined rhythm bradycardia Right bundle branch  block Confirmed by Lincoln Brighamees, Liz [phone]([id number]) on 11/20/2015 12:20:34 PM      MDM   Final diagnoses:  Junctional (nodal) bradycardia    Patient here for evaluation of dizziness. He is markedly bradycardic in the emergency department. He is perfused on examination but complaining of dizziness. EKG with junctional escape rhythm versus slow atrial fibrillation. He did not take his blood pressure medications this morning but did take Toprol-XL yesterday. Atropine was given with no response at all and his heart rate or symptoms. Cardiology paged for further management.  He was seen by cardiology in the emergency department with plan to admit for further treatment.      Tilden FossaElizabeth Brittny Spangle, MD 11/20/15 [phone]

## 2015-11-20 NOTE — Telephone Encounter (Signed)
Patient has gone to ED for evaluation.

## 2015-11-20 NOTE — Telephone Encounter (Signed)
Returned call to patient - he is dizzy right now - BP is low  Patient checked BP while on phone - systolic in 80s Patient was due for appointment in July Patient has appointment Sept 1 Patient was out walking yesterday 7/11 - had "burning" and pain in his chest. H/O CABG + stenting -- Patient did not use NTG -- Patient's wife states he had "burning" prior to needing his CABG Patient has low energy Patient states he feels like his valve is deteriorating  Patient has had chest pain on exertion in the past while exercising & walking  -- has occasionally taken NTG, "sometimes it helps, sometimes it doesn't"  Patient's AM BP meds - hydralazine - advised to hold AM dose  Patient states his normal BP is 140s systolic Patient does not feel comfortable taking BP medications if systolic is under 140  Offered sooner PA/NP appointment but patient/wife declined - would prefer to get Dr. Landry DykeKelly's opinion.

## 2015-11-20 NOTE — ED Notes (Signed)
Dr. Madilyn Hookees reports potassium is WNL, waiting for Cards to evaluate patient.

## 2015-11-20 NOTE — ED Notes (Signed)
Dr. Madilyn Hookees at bedside talking with pt and wife. Is a x 4. HR 28

## 2015-11-20 NOTE — ED Notes (Addendum)
Patient here with chest tightness that has occurred twice the past 24 hours. Had episode yesterday that occurred while walking and this am was dizzy when he awoke, arrived with chest tightness that started midmorning. Slightly pale on arrival. Alert and oriented. Bradycardia noted, HR 29-33 on arrival

## 2015-11-20 NOTE — Telephone Encounter (Signed)
Wife called back in. She wants Dr. Landry DykeKelly's recommendations. Explained that he is in clinic seeing patients and that I will speak with him as soon as I can. She [place]. She states that they don't want to go to the ED and just sit around. Explained that if Dr. Tresa EndoKelly recommends he go to the ED, someone will be notified of his arrival so that cardiology can come see him. Verified call back number w/wife. Reiterated I will speak with Dr. Tresa EndoKelly as soon as I am able

## 2015-11-20 NOTE — Telephone Encounter (Signed)
New message  Pt daughter called requesting to speak with RN about getting a sooner appt for pt. Pt daughter stated pt experienced burning and exertion last night 7/11. Please call back to discuss

## 2015-11-21 ENCOUNTER — Inpatient Hospital Stay (HOSPITAL_BASED_OUTPATIENT_CLINIC_OR_DEPARTMENT_OTHER): Payer: Medicare Other

## 2015-11-21 DIAGNOSIS — I480 Paroxysmal atrial fibrillation: Secondary | ICD-10-CM

## 2015-11-21 DIAGNOSIS — R001 Bradycardia, unspecified: Secondary | ICD-10-CM | POA: Diagnosis not present

## 2015-11-21 DIAGNOSIS — I5033 Acute on chronic diastolic (congestive) heart failure: Secondary | ICD-10-CM | POA: Diagnosis not present

## 2015-11-21 DIAGNOSIS — I2510 Atherosclerotic heart disease of native coronary artery without angina pectoris: Secondary | ICD-10-CM

## 2015-11-21 DIAGNOSIS — I350 Nonrheumatic aortic (valve) stenosis: Secondary | ICD-10-CM | POA: Diagnosis not present

## 2015-11-21 DIAGNOSIS — I130 Hypertensive heart and chronic kidney disease with heart failure and stage 1 through stage 4 chronic kidney disease, or unspecified chronic kidney disease: Secondary | ICD-10-CM | POA: Diagnosis not present

## 2015-11-21 DIAGNOSIS — I4891 Unspecified atrial fibrillation: Secondary | ICD-10-CM | POA: Diagnosis not present

## 2015-11-21 DIAGNOSIS — R0789 Other chest pain: Secondary | ICD-10-CM | POA: Diagnosis not present

## 2015-11-21 DIAGNOSIS — N184 Chronic kidney disease, stage 4 (severe): Secondary | ICD-10-CM | POA: Diagnosis not present

## 2015-11-21 LAB — ECHOCARDIOGRAM COMPLETE
Height: 69 in
WEIGHTICAEL: 3452.8 [oz_av]

## 2015-11-21 LAB — BASIC METABOLIC PANEL
Anion gap: 5 (ref 5–15)
BUN: 27 mg/dL — AB (ref 6–20)
CO2: 24 mmol/L (ref 22–32)
CREATININE: 1.78 mg/dL — AB (ref 0.61–1.24)
Calcium: 9.2 mg/dL (ref 8.9–10.3)
Chloride: 110 mmol/L (ref 101–111)
GFR calc non Af Amer: 35 mL/min — ABNORMAL LOW (ref >60)
GFR, EST AFRICAN AMERICAN: 41 mL/min — AB (ref >60)
GLUCOSE: 135 mg/dL — AB (ref 65–99)
Potassium: 4.1 mmol/L (ref 3.5–5.1)
Sodium: 139 mmol/L (ref 135–145)

## 2015-11-21 LAB — CBC
HEMATOCRIT: 36.8 % — AB (ref 39.0–52.0)
Hemoglobin: 11.9 g/dL — ABNORMAL LOW (ref 13.0–17.0)
MCH: 30.4 pg (ref 26.0–34.0)
MCHC: 32.3 g/dL (ref 30.0–36.0)
MCV: 93.9 fL (ref 78.0–100.0)
PLATELETS: 184 10*3/uL (ref 150–400)
RBC: 3.92 MIL/uL — ABNORMAL LOW (ref 4.22–5.81)
RDW: 13.1 % (ref 11.5–15.5)
WBC: 10.3 10*3/uL (ref 4.0–10.5)

## 2015-11-21 LAB — HEPARIN LEVEL (UNFRACTIONATED)
Heparin Unfractionated: 0.47 IU/mL (ref 0.30–0.70)
Heparin Unfractionated: 0.59 IU/mL (ref 0.30–0.70)

## 2015-11-21 LAB — TROPONIN I
Troponin I: <0.03 ng/mL (ref <0.03)
Troponin I: <0.03 ng/mL (ref <0.03)

## 2015-11-21 LAB — LIPID PANEL
CHOL/HDL RATIO: 3.3 ratio
CHOLESTEROL: 92 mg/dL (ref 0–200)
HDL: 28 mg/dL — AB (ref >40)
LDL Cholesterol: 26 mg/dL (ref 0–99)
TRIGLYCERIDES: 189 mg/dL — AB (ref <150)
VLDL: 38 mg/dL (ref 0–40)

## 2015-11-21 MED ORDER — APIXABAN 5 MG PO TABS
5.0000 mg | ORAL_TABLET | Freq: Two times a day (BID) | ORAL | Status: DC
  Administered 2015-11-21 – 2015-11-23 (×5): 5 mg via ORAL
  Filled 2015-11-21 (×5): qty 1

## 2015-11-21 NOTE — Care Management Important Message (Signed)
Important Message  Patient Details  Name: Jorge BodeGene S Cherry MRN: [id number] Date of Birth: 10/27/1935   Medicare Important Message Given:  Yes    Bernadette HoitShoffner, [name] 11/21/2015, 8:15 AM

## 2015-11-21 NOTE — Progress Notes (Addendum)
Gave pt 30day free eliquis card. Pt has [employer] medic[facility] ins for meds.Pt copay will be $33.60- prior Berkley Harveyauth is required [phone]

## 2015-11-21 NOTE — Progress Notes (Signed)
       Patient Name: Jorge Cherry Date of Encounter: 11/21/2015    SUBJECTIVE: Feels better. Denies dyspnea and weakness.  TELEMETRY:  Now in sinus rhythm Filed Vitals:   11/21/15 0600 11/21/15 0700 11/21/15 0730 11/21/15 0800  BP: 122/70 132/63 133/80 143/76  Pulse: 58 55 57 55  Temp:   98.3 F (36.8 C)   TempSrc:   Oral   Resp: 15 15 18 18   Height:      Weight:      SpO2: 95% 97% 95% 95%    Intake/Output Summary (Last 24 hours) at 11/21/15 0902 Last data filed at 11/21/15 0845  Gross per 24 hour  Intake 1093.62 ml  Output   1075 ml  Net  18.62 ml   LABS: Basic Metabolic Panel:  Recent Labs  78/29/5606/04/26 1420 11/21/15 0145  NA 139 139  K 4.5 4.1  CL 113* 110  CO2 20* 24  GLUCOSE 133* 135*  BUN 28* 27*  CREATININE 2.35* 1.78*  CALCIUM 9.0 9.2   CBC:  Recent Labs  11/20/15 1220 11/20/15 1222 11/21/15 0145  WBC 9.9  --  10.3  NEUTROABS 6.2  --   --   HGB 13.0 13.3 11.9*  HCT 40.6 39.0 36.8*  MCV 94.4  --  93.9  PLT 204  --  184   Cardiac Enzymes:  Recent Labs  11/20/15 1420 11/21/15 0145 11/21/15 0740  TROPONINI <0.03 <0.03 <0.03    Fasting Lipid Panel:  Recent Labs  11/21/15 0145  CHOL 92  HDL 28*  LDLCALC 26  TRIG 213189*  CHOLHDL 3.3    Radiology/Studies:  No new data  Physical Exam: Blood pressure 143/76, pulse 55, temperature 98.3 F (36.8 C), temperature source Oral, resp. rate 18, height 5\' 9"  (1.753 m), weight 222 lb 3.6 oz (100.8 kg), SpO2 95 %. Weight change:   Wt Readings from Last 3 Encounters:  11/21/15 222 lb 3.6 oz (100.8 kg)  05/03/15 214 lb 11.2 oz (97.387 kg)  02/05/15 209 lb 11.2 oz (95.119 kg)   Chest is clear. Cardiac reveals 2/6 systolic murmur. Abdomin is soft.   ASSESSMENT:  1. PAF, now resolved and in NSR.  2. CKD, improved. 3. CAD with no evidence of MI. 4. AS without symptoms 5. Hypotension, resolved. 6. AV conduction system disease  Plan:  1. Stop dopamine 2. No beta blocker or  Dilt./Verapamil 3. Start Eliquis 4. Stop Heparin 5. Ambulate 7. No requirement for permanent pacer at this time.  Signed, Lyn RecordsHenry W Maanav Kassabian III 11/21/2015, 9:02 AM

## 2015-11-21 NOTE — Progress Notes (Addendum)
ANTICOAGULATION CONSULT NOTE - Follow Up Consult  Pharmacy Consult for Heparin --> Apixaban Indication: atrial fibrillation  Allergies  Allergen Reactions  . Altace [Ramipril] Other (See Comments)    Mouth swelling  . Mucinex [Guaifenesin Er] Hives  . Contrast Media [Iodinated Diagnostic Agents] Other (See Comments)    Patient Measurements: Height: 5\' 9"  (175.3 cm) Weight: 222 lb 3.6 oz (100.8 kg) IBW/kg (Calculated) : 70.7 Heparin Dosing Weight: 91kg  Vital Signs: Temp: 98.3 F (36.8 C) (07/13 0730) Temp Source: Oral (07/13 0730) BP: 143/76 mmHg (07/13 0800) Pulse Rate: 55 (07/13 0800)  Labs:  Recent Labs  11/20/15 1220 11/20/15 1222 11/20/15 1420 11/20/15 2338 11/21/15 0145 11/21/15 0740  HGB 13.0 13.3  --   --  11.9*  --   HCT 40.6 39.0  --   --  36.8*  --   PLT 204  --   --   --  184  --   HEPARINUNFRC  --   --   --  0.47 0.59  --   CREATININE 2.23* 2.20* 2.35*  --  1.78*  --   TROPONINI  --   --  <0.03  --  <0.03 <0.03    Estimated Creatinine Clearance: 40.7 mL/min (by C-G formula based on Cr of 1.78).   Medications:  Heparin @ 1350 units/hr  Assessment: 79yom started on heparin yesterday for new onset afib. Heparin level is therapeutic at 0.59. CBC stable. No bleeding.  Goal of Therapy:  Heparin level 0.3-0.7 units/ml Monitor platelets by anticoagulation protocol: Yes   Plan:  1) Continue heparin at 1350 units/hr 2) Daily heparin level and CBC  Fredrik RiggerMarkle, Ryken Paschal Sue 11/21/2015,9:02 AM   Addendum: Pharmacy asked to transition patient to apixaban. He does not meet criteria for dose reduction so will start apixaban 5mg  bid. Case management consult for cost. Will educate.  Fredrik RiggerMarkle, Dionte Blaustein Sue 11/21/2015, 9:20 AM

## 2015-11-21 NOTE — Progress Notes (Signed)
ANTICOAGULATION CONSULT NOTE - Follow-up Consult  Pharmacy Consult for heparin Indication: atrial fibrillation  Allergies  Allergen Reactions  . Altace [Ramipril] Other (See Comments)    Mouth swelling  . Mucinex [Guaifenesin Er] Hives  . Contrast Media [Iodinated Diagnostic Agents] Other (See Comments)    Patient Measurements: Height: 5\' 9"  (175.3 cm) Weight: 215 lb (97.523 kg) IBW/kg (Calculated) : 70.7 Heparin Dosing Weight: 91 kg  Vital Signs: Temp: 98.1 F (36.7 C) (07/13 0000) Temp Source: Oral (07/13 0000) BP: 145/78 mmHg (07/13 0000) Pulse Rate: 54 (07/13 0000)  Labs:  Recent Labs  11/20/15 1220 11/20/15 1222 11/20/15 1420 11/20/15 2338  HGB 13.0 13.3  --   --   HCT 40.6 39.0  --   --   PLT 204  --   --   --   HEPARINUNFRC  --   --   --  0.47  CREATININE 2.23* 2.20* 2.35*  --   TROPONINI  --   --  <0.03  --     Estimated Creatinine Clearance: 30.3 mL/min (by C-G formula based on Cr of 2.35).  Assessment: 80 yo m on heparin for new onset afib. Heparin level therapeutic on 1350 units/hr. No bleeding noted.  Goal of Therapy:  Heparin level 0.3-0.7 units/ml Monitor platelets by anticoagulation protocol: Yes   Plan:  Continue heparin gtt at 1350 units/hr Will f/u confirmatory heparin level in a.m.  Christoper Fabianaron Seif Teichert, PharmD, BCPS Clinical pharmacist, pager [phone] 11/21/2015 1:18 AM

## 2015-11-21 NOTE — Progress Notes (Signed)
  Echocardiogram 2D Echocardiogram has been performed.  Jorge SavoyCasey N Dashawn Cherry 11/21/2015, 3:46 PM

## 2015-11-21 NOTE — Progress Notes (Signed)
Patient has regained SR, rates 60's-70's, off dopamine this morning.BP stable. Agree with general cardiology, no need for PPM implant, do not use rate limiting/nodal blocking agents for BP control going forward. And anticoagulation for PAFib.  EP Aidyn Kellis remain available, please recall if needed, further care as per primary cardiology service.  Francis Dowseenee Ursuy, PA-C   Loman BrooklynWill Keyarra Rendall, MD

## 2015-11-21 NOTE — Progress Notes (Signed)
Patient's blood pressure elevated 174/84 in right arm and 182/82 in left arm manually.  Patient has a "slight headache" but no other complaints of pain.  Ronie Spiesayna Dunn, PA made aware.  Will recheck blood pressure and continue to monitor.

## 2015-11-21 NOTE — Care Management Obs Status (Deleted)
MEDICARE OBSERVATION STATUS NOTIFICATION   Patient Details  Name: Jorge Cherry MRN: [id number] Date of Birth: 11/10/1935   Medicare Observation Status Notification Given:       [name], Ji Fairburn L, RN 11/21/2015, 4:14 PM

## 2015-11-21 NOTE — Care Management CC44 (Signed)
Condition Code 44 Documentation Completed  Patient Details  Name: Jorge Cherry MRN: [id number] Date of Birth: 07/29/1935   Condition Code 44 given:   Yes Patient signature on Condition Code 44 notice:   Yes Documentation of 2 MD's agreement:   yes Code 44 added to claim:   yes    [name], [name], RN 11/21/2015, 4:15 PM

## 2015-11-21 NOTE — Progress Notes (Signed)
Report given at this time to 3E

## 2015-11-21 NOTE — Discharge Instructions (Signed)

## 2015-11-21 NOTE — Care Management Obs Status (Signed)
MEDICARE OBSERVATION STATUS NOTIFICATION   Patient Details  Name: Jorge Cherry MRN: [id number] Date of Birth: 01/14/1936   Medicare Observation Status Notification Given:  Yes    [name], Azyria Osmon L, RN 11/21/2015, 4:14 PM

## 2015-11-22 DIAGNOSIS — R001 Bradycardia, unspecified: Secondary | ICD-10-CM | POA: Diagnosis not present

## 2015-11-22 DIAGNOSIS — I5033 Acute on chronic diastolic (congestive) heart failure: Secondary | ICD-10-CM | POA: Diagnosis not present

## 2015-11-22 DIAGNOSIS — N184 Chronic kidney disease, stage 4 (severe): Secondary | ICD-10-CM | POA: Diagnosis not present

## 2015-11-22 DIAGNOSIS — I119 Hypertensive heart disease without heart failure: Secondary | ICD-10-CM | POA: Diagnosis not present

## 2015-11-22 DIAGNOSIS — I130 Hypertensive heart and chronic kidney disease with heart failure and stage 1 through stage 4 chronic kidney disease, or unspecified chronic kidney disease: Secondary | ICD-10-CM | POA: Diagnosis not present

## 2015-11-22 DIAGNOSIS — I480 Paroxysmal atrial fibrillation: Secondary | ICD-10-CM | POA: Diagnosis not present

## 2015-11-22 DIAGNOSIS — I350 Nonrheumatic aortic (valve) stenosis: Secondary | ICD-10-CM | POA: Diagnosis not present

## 2015-11-22 LAB — BASIC METABOLIC PANEL
ANION GAP: 8 (ref 5–15)
BUN: 28 mg/dL — ABNORMAL HIGH (ref 6–20)
CO2: 23 mmol/L (ref 22–32)
Calcium: 9.1 mg/dL (ref 8.9–10.3)
Chloride: 107 mmol/L (ref 101–111)
Creatinine, Ser: 1.58 mg/dL — ABNORMAL HIGH (ref 0.61–1.24)
GFR calc Af Amer: 47 mL/min — ABNORMAL LOW (ref >60)
GFR calc non Af Amer: 41 mL/min — ABNORMAL LOW (ref >60)
GLUCOSE: 90 mg/dL (ref 65–99)
POTASSIUM: 4 mmol/L (ref 3.5–5.1)
Sodium: 138 mmol/L (ref 135–145)

## 2015-11-22 LAB — CBC
HEMATOCRIT: 34.9 % — AB (ref 39.0–52.0)
HEMOGLOBIN: 11.1 g/dL — AB (ref 13.0–17.0)
MCH: 30 pg (ref 26.0–34.0)
MCHC: 31.8 g/dL (ref 30.0–36.0)
MCV: 94.3 fL (ref 78.0–100.0)
Platelets: 168 10*3/uL (ref 150–400)
RBC: 3.7 MIL/uL — AB (ref 4.22–5.81)
RDW: 13 % (ref 11.5–15.5)
WBC: 7.2 10*3/uL (ref 4.0–10.5)

## 2015-11-22 MED ORDER — SPIRONOLACTONE 25 MG PO TABS
12.5000 mg | ORAL_TABLET | Freq: Every day | ORAL | Status: DC
  Administered 2015-11-22 – 2015-11-23 (×2): 12.5 mg via ORAL
  Filled 2015-11-22 (×2): qty 1

## 2015-11-22 MED ORDER — ISOSORBIDE MONONITRATE ER 60 MG PO TB24
60.0000 mg | ORAL_TABLET | Freq: Every day | ORAL | Status: DC
  Administered 2015-11-22 – 2015-11-23 (×2): 60 mg via ORAL
  Filled 2015-11-22 (×2): qty 1

## 2015-11-22 MED ORDER — HYDRALAZINE HCL 25 MG PO TABS
25.0000 mg | ORAL_TABLET | Freq: Three times a day (TID) | ORAL | Status: DC
  Administered 2015-11-22 – 2015-11-23 (×4): 25 mg via ORAL
  Filled 2015-11-22 (×4): qty 1

## 2015-11-22 MED ORDER — IRBESARTAN 300 MG PO TABS
300.0000 mg | ORAL_TABLET | Freq: Every day | ORAL | Status: DC
  Administered 2015-11-22 – 2015-11-23 (×2): 300 mg via ORAL
  Filled 2015-11-22 (×2): qty 1

## 2015-11-22 MED ORDER — METOPROLOL SUCCINATE ER 50 MG PO TB24
50.0000 mg | ORAL_TABLET | Freq: Every day | ORAL | Status: DC
  Administered 2015-11-22 – 2015-11-23 (×2): 50 mg via ORAL
  Filled 2015-11-22 (×2): qty 1

## 2015-11-22 MED ORDER — TORSEMIDE 20 MG PO TABS
10.0000 mg | ORAL_TABLET | Freq: Every day | ORAL | Status: DC
  Administered 2015-11-22 – 2015-11-23 (×2): 10 mg via ORAL
  Filled 2015-11-22 (×2): qty 1

## 2015-11-22 NOTE — Progress Notes (Signed)
RN called about pt having chest burning with ambulation.  He rested and resolved, similar to previous angina.  His BP is very elevated as well.  Discussed with Dr. Katrinka BlazingSmith and will keep tonight I added imdur first dose now and daily - will re-evaluate in AM.

## 2015-11-22 NOTE — Progress Notes (Addendum)
   We planned earlier to discharge the patient later today if he was able to ambulate without difficulty.  He had burning chest discomfort with activity that resolved with rest. The clinical interpretation is that of angina.  I have cancelled the patient's discharge. We added isosorbide mononitrate. We need better blood pressure control prior to discharge. May be able to further uptitrate beta blocker in a.m. if he tolerates the 50 mg dose that he received today. I would not resume diltiazem as I think this significantly contributed to the bradycardia he had on admission.  Kidney function should be reevaluated in a.m.  If angina continues to be a problem after blood pressure controlled, he will need to have ischemic evaluation with coronary angiography.

## 2015-11-22 NOTE — Progress Notes (Signed)
Patient BP manually 184/82 and HR 64.  Patient ambulated with wife in the hallway and had some "burning in his chest" that resolved after sitting for a few minutes.  Nada BoozerLaura Ingold, PA made aware.  Will continue to monitor.

## 2015-11-22 NOTE — Progress Notes (Signed)
Patient Name: Jorge Cherry Date of Encounter: 11/22/2015    SUBJECTIVE: Feels much better. Wife is at bedside. He is ambulated without dyspnea or angina. Heart rates have all been above 60 bpm. He is not receiving any of his antihypertensive therapy.  TELEMETRY:  Normal sinus rhythm without recurrent A. fib Filed Vitals:   11/21/15 2124 11/22/15 0119 11/22/15 0630 11/22/15 0941  BP: 149/95 155/60 154/63 174/82  Pulse:  60 61 62  Temp:  98.7 F (37.1 C) 98.5 F (36.9 C)   TempSrc:  Oral Oral   Resp:  19 18   Height:      Weight:  218 lb (98.884 kg)    SpO2:  97% 97%     Intake/Output Summary (Last 24 hours) at 11/22/15 1141 Last data filed at 11/22/15 0600  Gross per 24 hour  Intake    480 ml  Output    950 ml  Net   -470 ml   LABS: Basic Metabolic Panel:  Recent Labs  16/10/96 0145 11/22/15 0318  NA 139 138  K 4.1 4.0  CL 110 107  CO2 24 23  GLUCOSE 135* 90  BUN 27* 28*  CREATININE 1.78* 1.58*  CALCIUM 9.2 9.1   CBC:  Recent Labs  11/20/15 1220  11/21/15 0145 11/22/15 0318  WBC 9.9  --  10.3 7.2  NEUTROABS 6.2  --   --   --   HGB 13.0  < > 11.9* 11.1*  HCT 40.6  < > 36.8* 34.9*  MCV 94.4  --  93.9 94.3  PLT 204  --  184 168  < > = values in this interval not displayed. Cardiac Enzymes:  Recent Labs  11/20/15 1420 11/21/15 0145 11/21/15 0740  TROPONINI <0.03 <0.03 <0.03   BNP: Invalid input(s): POCBNP Hemoglobin A1C: No results for input(s): HGBA1C in the last 72 hours. Fasting Lipid Panel:  Recent Labs  11/21/15 0145  CHOL 92  HDL 28*  LDLCALC 26  TRIG 045*  CHOLHDL 3.3   Echocardiogram: No significant change in LV function or progression of aortic stenosis. Radiology/Studies:  No new data  Physical Exam: Blood pressure 174/82, pulse 62, temperature 98.5 F (36.9 C), temperature source Oral, resp. rate 18, height  (1.753 m), weight 218 lb (98.884 kg), SpO2 97 %. Weight change: 12.8 oz (0.363 kg)  Wt Readings  from Last 3 Encounters:  11/22/15 218 lb (98.884 kg)  05/03/15 214 lb 11.2 oz (97.387 kg)  02/05/15 209 lb 11.2 oz (95.119 kg)   Lower extremities reveal edema, trace to 1+ right greater than left. S4 gallop Clear lung fields Neurological exam and mentation are normal  ASSESSMENT:  1. New-onset atrial fibrillation, reverting to normal sinus rhythm spontaneously 2. Profound bradycardia while in atrial fibrillation and under the influence of the combined therapy of diltiazem (120 mg per day) and metoprolol (succinate 100 mg) 3. Poor blood pressure control off all medications. 4. No angina pectoris in this patient with chronic coronary disease and prior bypass grafting 5. Dramatic improvement in kidney function off ARB, and diuretic therapy.  Plan: 1. Resume antihypertensive regimen including hydralazine, spironolactone, Micardis and torsemide. All of these would begin this morning. 2. Resume metoprolol at 50% of prior dose, metoprolol succinate 50 mg daily. This will be given this morning. 3. Discontinue diltiazem permanently 4. Eliquis 5 mg twice a day. May need dose adjustment based on kidney function 5. Basic metabolic panel in 5 days and  clinical follow-up in 5-7 days with EKG 6. Ambulate and discharge prior to 5 PM if he remains stable. Needs to remain in hospital for at least 4 hours after all medications have been administered this morning.  Signed, Lyn RecordsHenry W Callen Zuba III 11/22/2015, 11:41 AM

## 2015-11-23 DIAGNOSIS — N184 Chronic kidney disease, stage 4 (severe): Secondary | ICD-10-CM | POA: Diagnosis not present

## 2015-11-23 DIAGNOSIS — I119 Hypertensive heart disease without heart failure: Secondary | ICD-10-CM | POA: Diagnosis not present

## 2015-11-23 DIAGNOSIS — I350 Nonrheumatic aortic (valve) stenosis: Secondary | ICD-10-CM | POA: Diagnosis not present

## 2015-11-23 DIAGNOSIS — I480 Paroxysmal atrial fibrillation: Secondary | ICD-10-CM | POA: Diagnosis not present

## 2015-11-23 DIAGNOSIS — I130 Hypertensive heart and chronic kidney disease with heart failure and stage 1 through stage 4 chronic kidney disease, or unspecified chronic kidney disease: Secondary | ICD-10-CM | POA: Diagnosis not present

## 2015-11-23 DIAGNOSIS — I5033 Acute on chronic diastolic (congestive) heart failure: Secondary | ICD-10-CM | POA: Diagnosis not present

## 2015-11-23 DIAGNOSIS — R001 Bradycardia, unspecified: Secondary | ICD-10-CM | POA: Diagnosis not present

## 2015-11-23 LAB — BASIC METABOLIC PANEL
Anion gap: 10 (ref 5–15)
BUN: 25 mg/dL — ABNORMAL HIGH (ref 6–20)
CALCIUM: 9.2 mg/dL (ref 8.9–10.3)
CHLORIDE: 109 mmol/L (ref 101–111)
CO2: 21 mmol/L — ABNORMAL LOW (ref 22–32)
CREATININE: 1.53 mg/dL — AB (ref 0.61–1.24)
GFR calc non Af Amer: 42 mL/min — ABNORMAL LOW (ref >60)
GFR, EST AFRICAN AMERICAN: 49 mL/min — AB (ref >60)
Glucose, Bld: 76 mg/dL (ref 65–99)
Potassium: 4.7 mmol/L (ref 3.5–5.1)
SODIUM: 140 mmol/L (ref 135–145)

## 2015-11-23 LAB — CBC
HCT: 36.6 % — ABNORMAL LOW (ref 39.0–52.0)
HEMOGLOBIN: 12 g/dL — AB (ref 13.0–17.0)
MCH: 30.6 pg (ref 26.0–34.0)
MCHC: 32.8 g/dL (ref 30.0–36.0)
MCV: 93.4 fL (ref 78.0–100.0)
Platelets: 169 10*3/uL (ref 150–400)
RBC: 3.92 MIL/uL — ABNORMAL LOW (ref 4.22–5.81)
RDW: 13 % (ref 11.5–15.5)
WBC: 8.1 10*3/uL (ref 4.0–10.5)

## 2015-11-23 LAB — MAGNESIUM: MAGNESIUM: 2 mg/dL (ref 1.7–2.4)

## 2015-11-23 MED ORDER — TORSEMIDE 10 MG PO TABS: 10.0000 mg | ORAL_TABLET | Freq: Every day | ORAL | Status: DC

## 2015-11-23 MED ORDER — APIXABAN 5 MG PO TABS: 5.0000 mg | ORAL_TABLET | Freq: Two times a day (BID) | ORAL | Status: DC

## 2015-11-23 MED ORDER — METOPROLOL SUCCINATE ER 50 MG PO TB24: 50.0000 mg | ORAL_TABLET | Freq: Every day | ORAL | Status: DC

## 2015-11-23 MED ORDER — ISOSORBIDE MONONITRATE ER 60 MG PO TB24: 60.0000 mg | ORAL_TABLET | Freq: Every day | ORAL | Status: DC

## 2015-11-23 NOTE — Discharge Summary (Signed)
Discharge Summary    Patient ID: Jorge Cherry,  MRN: [id number], DOB/AGE: 78/10/1935 78 y.o.  Admit date: 11/20/2015 Discharge date: 11/23/2015  Primary Care Provider: Chilton GreathouseAVVA,RAVISANKAR R Primary Cardiologist: Dr. Tresa EndoKelly EP: Dr. Ladona Ridgelaylor  Discharge Diagnoses    Principal Problem:   Junctional (nodal) bradycardia Active Problems:   Hx of CABG   Coronary atherosclerosis of native coronary artery   Benign hypertensive heart disease without heart failure   Aortic valve stenosis   Chronic renal insufficiency, stage III (moderate)   Chest pain   Hyperlipidemia LDL goal <70   Atrial fibrillation (HCC)   Allergies Allergies  Allergen Reactions  . Altace [Ramipril] Other (See Comments)    Mouth swelling  . Mucinex [Guaifenesin Er] Hives  . Contrast Media [Iodinated Diagnostic Agents] Other (See Comments)    Diagnostic Studies/Procedures     Echo 11/21/2015 LV EF: 55% - 60%  ------------------------------------------------------------------- Indications: Atrial fibrillation - 427.31.  ------------------------------------------------------------------- History: PMH: Coronary artery disease. Aortic valve disease. Risk factors: Hypertension. Dyslipidemia.  ------------------------------------------------------------------- Study Conclusions  - Left ventricle: The cavity size was normal. There was mild focal  basal hypertrophy of the septum. Systolic function was normal.  The estimated ejection fraction was in the range of 55% to 60%.  Wall motion was normal; there were no regional wall motion  abnormalities. Left ventricular diastolic function parameters  were normal. - Aortic valve: Trileaflet; moderately thickened, moderately  calcified leaflets. Valve mobility was restricted. There was mild  stenosis. Peak velocity (S): 287 cm/s. Valve area (VTI): 1.77  cm^2. Valve area (Vmax): 1.75 cm^2. Valve area (Vmean): 1.5 cm^2. - Mitral valve: There was  mild regurgitation. - Left atrium: The atrium was moderately dilated. - Pulmonary arteries: Systolic pressure was mildly increased. PA  peak pressure: 43 mm Hg (S).  Impressions:  - Compared to the prior study, there has been no significant  interval change.   History of Present Illness     80 year old gentleman  with a history of CAD dating back to 1994 at which time he underwent CABG revascularization surgery. In October 2003 he underwent stenting to the proximal portion of the vein graft supplying the diagonal vessel with a 3.5x16 mm Taxus DES stent post dilated to 4.0 mm. He has diffuse disease in the distal apical portion of the LAD beyond the LIMA insertion treated medically. He has documented mild aortic valve stenosis with grade 2 diastolic dysfunction with concentric left ventricular hypertrophy. He has documented renal cysts, history of hypertension, mixed hyperlipidemia. His last Myoview study was in June 2013 which showed a minimal apical defect. Post-stess ejection fraction was 56%.  The patient gives a several month history of exertional chest discomfort in the midsternal area. He has felt it daily when he goes on his usual walk with his wife. At morning of 11/20/15 after awakening he felt fatigue, dizzy, and weak. He measured his blood pressure on several occasions and the systolic pressures were less than 90 mmHg. Because of the weakness and the low blood pressures he was brought to the emergency room by EMS. Lying flat in the emergency room his heart rate is 30 with a narrow complex escape rhythm and possible underlying atrial fibrillation. He denies chest discomfort, dyspnea, and other complaints.  He has recently noted lower extremity swelling and mild dyspnea on exertion. He states the lower extremity swelling began several months ago when his diuretic regimen was decreased.  1990 for coronary artery bypass grafting with LIMA to his LAD,  SVG to his diagonal, saphenous vein  to his marginal, and SVG to his right coronary artery.  As noted above the saphenous vein graft to the diagonal was stented with a Taxus DES in 2005.  Hospital Course     Consultants: EP  He was observed to be in AFib with HR 30's and started on dopamine with hypotension, noting BP charted as low as 65/54. Held BB and CCB. Symptoms improved on dopamine however HR remained low. Seen by EP and plan made to observe the patient until washout of his AV blocking drugs. His HR sustained in 60-70s after taking off dopamine. He dose not required PPM or external pacer. He spontaneously converted to sinus rhythm and heparin switched to eliquis. He ambulated without dyspnea or angina. Echo showed LV EF of 55-60%, no WM abnormality, mild MR, moderate dilated LA, PA pressure of 43 mm Hg. BP and HR remained stable on antihypertensive regimen including hydralazine, spironolactone, Micardis,  Torsemide and Toprol XL  qd. Discontinued Cardizm. His kidney function improved off diuretics and ARB. Will need BMET during TCM. Not on ASA due to need of anticoagulation.   The patient has been seen by Dr. Delton See today and deemed ready for discharge home. All follow-up appointments have been scheduled. Discharge medications are listed below.    Discharge Vitals Blood pressure 137/87, pulse 60, temperature 98.3 F (36.8 C), temperature source Oral, resp. rate 20, height  (1.753 m), weight 212 lb 12.8 oz (96.525 kg), SpO2 94 %.  Filed Weights   11/21/15 1149 11/22/15 0119 11/23/15 0630  Weight: 215 lb 12.8 oz (97.886 kg) 218 lb (98.884 kg) 212 lb 12.8 oz (96.525 kg)    Labs & Radiologic Studies     CBC  Recent Labs  11/22/15 0318 11/23/15 0353  WBC 7.2 8.1  HGB 11.1* 12.0*  HCT 34.9* 36.6*  MCV 94.3 93.4  PLT 168 169   Basic Metabolic Panel  Recent Labs  11/22/15 0318 11/23/15 0353 11/23/15 0854  NA 138 140  --   K 4.0 4.7  --   CL 107 109  --   CO2 23 21*  --   GLUCOSE 90 76  --   BUN  28* 25*  --   CREATININE 1.58* 1.53*  --   CALCIUM 9.1 9.2  --   MG  --   --  2.0   Liver Function Tests No results for input(s): AST, ALT, ALKPHOS, BILITOT, PROT, ALBUMIN in the last 72 hours. No results for input(s): LIPASE, AMYLASE in the last 72 hours. Cardiac Enzymes  Recent Labs  11/21/15 0145 11/21/15 0740  TROPONINI <0.03 <0.03   BNP Invalid input(s): POCBNP D-Dimer No results for input(s): DDIMER in the last 72 hours. Hemoglobin A1C No results for input(s): HGBA1C in the last 72 hours. Fasting Lipid Panel  Recent Labs  11/21/15 0145  CHOL 92  HDL 28*  LDLCALC 26  TRIG 098*  CHOLHDL 3.3   Thyroid Function Tests No results for input(s): TSH, T4TOTAL, T3FREE, THYROIDAB in the last 72 hours.  Invalid input(s): FREET3  Dg Chest Port 1 View  11/20/2015  CLINICAL DATA:  Chest pain and dizziness EXAM: PORTABLE CHEST 1 VIEW COMPARISON:  January 22, 2015 FINDINGS: There is no edema or consolidation. Heart is borderline enlarged with pulmonary vascularity within normal limits. There is atherosclerotic calcification in the aortic arch. Patient is status post coronary artery bypass grafting. No adenopathy. No bone lesions. IMPRESSION: No edema or consolidation. Aortic atherosclerosis. Patient  is status post coronary artery bypass grafting. Heart borderline enlarged. Electronically Signed   By: Bretta Bang III M.D.   On: 11/20/2015 12:51    Disposition   Pt is being discharged home today in good condition.  Follow-up Plans & Appointments    Follow-up Information    Follow up with Nicki Guadalajara, MD. Schedule an appointment as soon as possible for a visit in 1 week.   Specialty:  Cardiology   Why:  office will call with time and date    Contact information:   [address] [place] [address] [phone]      Discharge Instructions    Diet - low sodium heart healthy    Complete by:  As directed      Increase activity slowly    Complete  by:  As directed            Discharge Medications   Current Discharge Medication List    START taking these medications   Details  apixaban (ELIQUIS) 5 MG TABS tablet Take 1 tablet (5 mg total) by mouth 2 (two) times daily. Qty: 60 tablet, Refills: 11    isosorbide mononitrate (IMDUR) 60 MG 24 hr tablet Take 1 tablet (60 mg total) by mouth daily. Qty: 30 tablet, Refills: 6      CONTINUE these medications which have CHANGED   Details  metoprolol succinate (TOPROL-XL) 50 MG 24 hr tablet Take 1 tablet (50 mg total) by mouth daily. Take with or immediately following a meal. Qty: 30 tablet, Refills: 6    torsemide (DEMADEX) 10 MG tablet Take 1 tablet (10 mg total) by mouth daily. Qty: 30 tablet, Refills: 6      CONTINUE these medications which have NOT CHANGED   Details  albuterol (PROAIR HFA) 108 (90 Base) MCG/ACT inhaler INHALE TWO PUFFS EVERY 4-6 HOURS AS NEEDED FOR COUGH OR WHEEZE Qty: 1 Inhaler, Refills: 1   Associated Diagnoses: Mild persistent asthma, uncomplicated    atorvastatin (LIPITOR) 40 MG tablet Take 20 mg by mouth daily.    Azelastine-Fluticasone (DYMISTA) 137-50 MCG/ACT SUSP USE ONE SPRAY IN EACH NOSTRIL TWICE DAILY FOR STUFFY NOSE OR DRAINAGE. Qty: 23 g, Refills: 4    beclomethasone (QVAR) 40 MCG/ACT inhaler Inhale 1 puff into the lungs daily.    Cholecalciferol (VITAMIN D-3 PO) Take 1,000 Units by mouth daily.     ezetimibe (ZETIA) 10 MG tablet TAKE 1 TABLET (10 MG TOTAL) BY MOUTH DAILY. Qty: 90 tablet, Refills: 1    hydrALAZINE (APRESOLINE) 25 MG tablet Take 1 tablet (25 mg total) by mouth 3 (three) times daily. Qty: 90 tablet, Refills: 6    loratadine (CLARITIN) 10 MG tablet Take 10 mg by mouth daily.    nitroGLYCERIN (NITROLINGUAL) 0.4 MG/SPRAY spray Place 1 spray under the tongue every 5 (five) minutes x 3 doses as needed for chest pain. Qty: 12 g, Refills: 2    pantoprazole (PROTONIX) 40 MG tablet Take 1 tablet (40 mg total) by mouth 2 (two)  times daily. Qty: 60 tablet, Refills: 5    spironolactone (ALDACTONE) 25 MG tablet TAKE 1/2 TABLET (12.5 MG TOTAL) BY MOUTH DAILY. Qty: 15 tablet, Refills: 3    telmisartan (MICARDIS) 80 MG tablet Take 80 mg by mouth daily.    vitamin C (ASCORBIC ACID) 500 MG tablet Take 500 mg by mouth daily.    vitamin E 400 UNIT capsule Take 400 Units by mouth daily.    zinc gluconate 50 MG tablet Take  50 mg by mouth 2 (two) times a week.    fluticasone (FLOVENT HFA) 110 MCG/ACT inhaler Use 3 puffs 3 times daily as directed for asthma flareup Qty: 12 g, Refills: 5      STOP taking these medications     aspirin 81 MG tablet      clopidogrel (PLAVIX) 75 MG tablet      diltiazem (CARDIZEM CD) 240 MG 24 hr capsule      fenofibrate (TRICOR) 145 MG tablet      fish oil-omega-3 fatty acids 1000 MG capsule           Outstanding Labs/Studies   BMET  Duration of Discharge Encounter   Greater than 30 minutes including physician time.  Signed, Edi Gorniak PA-C 11/23/2015, 2:45 PM

## 2015-11-23 NOTE — Progress Notes (Signed)
Pt ambulated in the hallway with wife [phone] x3 without any pain.

## 2015-11-23 NOTE — Progress Notes (Signed)
Patient Name: Jorge BodeGene S Toomey Date of Encounter: 11/23/2015  SUBJECTIVE: Feels much better. Wife is at bedside. No chest pain since yesterday, however has headaches.  [name]. antiseptic oral rinse  7 mL Mouth Rinse BID  . apixaban  5 mg Oral BID  . atorvastatin  20 mg Oral Daily  . ezetimibe  10 mg Oral Daily  . hydrALAZINE  25 mg Oral Q8H  . irbesartan  300 mg Oral Daily  . isosorbide mononitrate  60 mg Oral Daily  . loratadine  10 mg Oral Daily  . metoprolol succinate  50 mg Oral Daily  . pantoprazole  40 mg Oral BID  . spironolactone  12.5 mg Oral Daily  . torsemide  10 mg Oral Daily  . vitamin C  500 mg Oral Daily  . vitamin E  400 Units Oral Daily   TELEMETRY:  Episodes of a-fib with spontaneous CV into SR this am.   Filed Vitals:   11/22/15 2100 11/23/15 0630 11/23/15 1046 11/23/15 1201  BP: 146/90 132/86 133/99 137/87  Pulse: 69 70  60  Temp: 97.5 F (36.4 C) 98.4 F (36.9 C)  98.3 F (36.8 C)  TempSrc: Oral Oral  Oral  Resp: 18 18  20   Height:      Weight:  212 lb 12.8 oz (96.525 kg)    SpO2: 94% 94%  94%    Intake/Output Summary (Last 24 hours) at 11/23/15 1309 Last data filed at 11/23/15 0900  Gross per 24 hour  Intake    480 ml  Output   1150 ml  Net   -670 ml   LABS: Basic Metabolic Panel:  Recent Labs  82/95/6207/14/17 0318 11/23/15 0353 11/23/15 0854  NA 138 140  --   K 4.0 4.7  --   CL 107 109  --   CO2 23 21*  --   GLUCOSE 90 76  --   BUN 28* 25*  --   CREATININE 1.58* 1.53*  --   CALCIUM 9.1 9.2  --   MG  --   --  2.0   CBC:  Recent Labs  11/22/15 0318 11/23/15 0353  WBC 7.2 8.1  HGB 11.1* 12.0*  HCT 34.9* 36.6*  MCV 94.3 93.4  PLT 168 169   Cardiac Enzymes:  Recent Labs  11/20/15 1420 11/21/15 0145 11/21/15 0740  TROPONINI <0.03 <0.03 <0.03    Recent Labs  11/21/15 0145  CHOL 92  HDL 28*  LDLCALC 26  TRIG [id number]*  CHOLHDL 3.3   Echocardiogram: No significant change in LV function or progression of aortic  stenosis. Radiology/Studies:  No new data  Physical Exam: Blood pressure 137/87, pulse 60, temperature 98.3 F (36.8 C), temperature source Oral, resp. rate 20, height 5\' 9"  (1.753 m), weight 212 lb 12.8 oz (96.525 kg), SpO2 94 %. Weight change: -3 lb (-1.361 kg)  Wt Readings from Last 3 Encounters:  11/23/15 212 lb 12.8 oz (96.525 kg)  05/03/15 214 lb 11.2 oz (97.387 kg)  02/05/15 209 lb 11.2 oz (95.119 kg)   Lower extremities reveal edema, trace to 1+ right greater than left. S4 gallop Clear lung fields Neurological exam and mentation are normal   ASSESSMENT:  1. New-onset atrial fibrillation, reverting to normal sinus rhythm spontaneously 2. Profound bradycardia while in atrial fibrillation and under the influence of the combined therapy of diltiazem (120 mg per day) and metoprolol (succinate 100 mg) 3. Poor blood pressure control off all medications. 4. No angina pectoris in  this patient with chronic coronary disease and prior bypass grafting 5. Dramatic improvement in kidney function off ARB, and diuretic therapy.  Plan:  1. Resume antihypertensive regimen including hydralazine, spironolactone, Micardis and torsemide. All of these would begin this morning. E has headaches sec to imdur, we will give tylenol and reevaluate at the next outpatient visit/ Hopefully it resolves after few days. 2. Resumed metoprolol at 50% of prior dose, metoprolol succinate 50 mg daily. This will be given this morning. 3. Discontinued diltiazem permanently 4. Eliquis 5 mg twice a day. May need dose adjustment based on kidney function 5. Basic metabolic panel in 5 days and clinical follow-up in 5-7 days with EKG 6.nsVTs on telemetry, asymptomatic, up to 6 beats, if he has recurrent chest pain an ishemic work up needs to be done, possibly outpatient.  Discharge home today.   SignedTobias Alexander 11/23/2015, 1:09 PM

## 2015-11-25 ENCOUNTER — Other Ambulatory Visit: Payer: Self-pay

## 2015-11-25 MED ORDER — SPIRONOLACTONE 25 MG PO TABS: ORAL_TABLET | ORAL | Status: DC

## 2015-12-02 ENCOUNTER — Ambulatory Visit (INDEPENDENT_AMBULATORY_CARE_PROVIDER_SITE_OTHER): Payer: Medicare Other | Admitting: Nurse Practitioner

## 2015-12-02 ENCOUNTER — Encounter: Payer: Self-pay | Admitting: Nurse Practitioner

## 2015-12-02 VITALS — BP 150/100 | HR 60 | Ht 69.0 in | Wt 210.0 lb

## 2015-12-02 DIAGNOSIS — Z951 Presence of aortocoronary bypass graft: Secondary | ICD-10-CM

## 2015-12-02 DIAGNOSIS — I10 Essential (primary) hypertension: Secondary | ICD-10-CM | POA: Diagnosis not present

## 2015-12-02 DIAGNOSIS — I25810 Atherosclerosis of coronary artery bypass graft(s) without angina pectoris: Secondary | ICD-10-CM | POA: Diagnosis not present

## 2015-12-02 DIAGNOSIS — I480 Paroxysmal atrial fibrillation: Secondary | ICD-10-CM | POA: Diagnosis not present

## 2015-12-02 LAB — CBC
HCT: 37.9 % — ABNORMAL LOW (ref 38.5–50.0)
Hemoglobin: 12.8 g/dL — ABNORMAL LOW (ref 13.2–17.1)
MCH: 30.5 pg (ref 27.0–33.0)
MCHC: 33.8 g/dL (ref 32.0–36.0)
MCV: 90.5 fL (ref 80.0–100.0)
MPV: 10.6 fL (ref 7.5–12.5)
Platelets: 270 10*3/uL (ref 140–400)
RBC: 4.19 MIL/uL — ABNORMAL LOW (ref 4.20–5.80)
RDW: 13 % (ref 11.0–15.0)
WBC: 7.6 10*3/uL (ref 3.8–10.8)

## 2015-12-02 LAB — BASIC METABOLIC PANEL
BUN: 27 mg/dL — ABNORMAL HIGH (ref 7–25)
CO2: 22 mmol/L (ref 20–31)
Calcium: 9.3 mg/dL (ref 8.6–10.3)
Chloride: 110 mmol/L (ref 98–110)
Creat: 1.46 mg/dL — ABNORMAL HIGH (ref 0.70–1.18)
Glucose, Bld: 95 mg/dL (ref 65–99)
Potassium: 4.4 mmol/L (ref 3.5–5.3)
Sodium: 143 mmol/L (ref 135–146)

## 2015-12-02 NOTE — Progress Notes (Signed)
CARDIOLOGY OFFICE NOTE  Date:  12/02/2015    Jorge Cherry Date of Birth: 07/04/1935 Medical Record #[id number]  PCP:  Hoyle Sauer, MD  Cardiologist:  Tresa Endo  Chief Complaint  Patient presents with  . Coronary Artery Disease  . Atrial Fibrillation    Post hospital visit - seen for Dr. Tresa Endo    History of Present Illness: Jorge Cherry is a 80 y.o. male who presents today for a post hospital visit. Seen for Dr. Tresa Endo.   He has a history of CAD dating back to 1994 at which time he had CABG in 1990 with LIMA to his LAD, SVG to his diagonal, saphenous vein to his marginal, and SVG to his right coronary arteryIn October of 2003 he had PCI with stenting of the proximal portion of the SVG to the DX. Diffuse disease in the distal apicla portion of the LAD beyond the LIMA insertion which was to be treated medically. Other issues include AS, diastolic dysfunction, LVH, renal cysts, HTN and HLD. He had a hospital stay with CP in September 2016 with low risk stress test with an inferolateral wall defect Dr. Tresa Endo reports is unchanged from previous.  Last seen in the office back in December.   Presented earlier this month with a several month history of exertional chest pain, asssociated with weakness. Brought to the ER by EMS - HR in the 30's with narrow complex escapte rhythm and possible AF.   He was started on dpamine. BB and CCB therapy held due to marked hypotension. Seen by EP and plan was to observe until washout of his AV nodal agents. HR 60 to 70's, did not require PPM. Did convert spontaneously to NSR. Placed on Eliquis. Echo showed Ef of 55 to 60%. Looks like his beta blocker was restarted at half dose and his Diltiazem continued to be held.   Comes back today. Here with his wife. Little stress this morning. Their dog is down at the vet school in [place] - not doing well. They have just gotten back from driving and he has just taken his morning medicines. Says BP is better at  home - but not really able to tell me any numbers. No chest pain. Not really back to all of his usual activities - mostly due to the dog's illness. Not dizzy. No passing out. Not short of breath.  .  Past Medical History:  Diagnosis Date  . Aortic valve disorder    2D ECHO, 10/19/2011 - EF >55%, LA mild-moderately dilated, mild-moderate tricuspid regurgitation, mild-moderate valvular aortic stenosis, moderate calcification of aortic valve leaflets  . Atherosclerosis of renal artery (HCC)    RENAL DOPPLER, 12/10/2011 - Left renal artery demonstrated narrowing with elevated velocities consistent with a 1-59% diameter reduction  . Coronary artery disease   . Hypertension     Past Surgical History:  Procedure Laterality Date  . CARDIAC CATHETERIZATION  02/27/2004   Coronary intervention and medical management  . CARDIAC CATHETERIZATION  03/04/2004   SVG supplying the diagonal vessel stented with a 3.5x82mm Taxus stent post dilated to 4.0 mm  . CATARACT EXTRACTION    . CORONARY ARTERY BYPASS GRAFT    . CORONARY STENT PLACEMENT    . INGUINAL HERNIA REPAIR Right   . TONSILLECTOMY AND ADENOIDECTOMY       Medications: Current Outpatient Prescriptions  Medication Sig Dispense Refill  . albuterol (PROAIR HFA) 108 (90 Base) MCG/ACT inhaler INHALE TWO PUFFS EVERY 4-6 HOURS AS NEEDED FOR  COUGH OR WHEEZE 1 Inhaler 1  . apixaban (ELIQUIS) 5 MG TABS tablet Take 1 tablet (5 mg total) by mouth 2 (two) times daily. 60 tablet 11  . atorvastatin (LIPITOR) 40 MG tablet Take 20 mg by mouth daily.    . Azelastine-Fluticasone (DYMISTA) 137-50 MCG/ACT SUSP USE ONE SPRAY IN EACH NOSTRIL TWICE DAILY FOR STUFFY NOSE OR DRAINAGE. 23 g 4  . beclomethasone (QVAR) 40 MCG/ACT inhaler Inhale 1 puff into the lungs daily.    . Cholecalciferol (VITAMIN D-3 PO) Take 1,000 Units by mouth daily.     [name] ezetimibe (ZETIA) 10 MG tablet TAKE 1 TABLET (10 MG TOTAL) BY MOUTH DAILY. 90 tablet 1  . fluticasone (FLOVENT HFA) 110  MCG/ACT inhaler Use 3 puffs 3 times daily as directed for asthma flareup 12 g 5  . hydrALAZINE (APRESOLINE) 25 MG tablet Take 1 tablet (25 mg total) by mouth 3 (three) times daily. 90 tablet 6  . isosorbide mononitrate (IMDUR) 60 MG 24 hr tablet Take 1 tablet (60 mg total) by mouth daily. 30 tablet 6  . loratadine (CLARITIN) 10 MG tablet Take 10 mg by mouth daily.    . metoprolol succinate (TOPROL-XL) 50 MG 24 hr tablet Take 1 tablet (50 mg total) by mouth daily. Take with or immediately following a meal. 30 tablet 6  . nitroGLYCERIN (NITROLINGUAL) 0.4 MG/SPRAY spray Place 1 spray under the tongue every 5 (five) minutes x 3 doses as needed for chest pain. 12 g 2  . pantoprazole (PROTONIX) 40 MG tablet Take 40 mg by mouth daily.    [name] spironolactone (ALDACTONE) 25 MG tablet TAKE 1/2 TABLET (12.5 MG TOTAL) BY MOUTH DAILY. 15 tablet 2  . telmisartan (MICARDIS) 80 MG tablet Take 80 mg by mouth daily.    [name] torsemide (DEMADEX) 10 MG tablet Take 1 tablet (10 mg total) by mouth daily. 30 tablet 6  . vitamin C (ASCORBIC ACID) 500 MG tablet Take 500 mg by mouth daily.    . vitamin E 400 UNIT capsule Take 400 Units by mouth daily.    [name] zinc gluconate 50 MG tablet Take 50 mg by mouth 2 (two) times a week.     No current facility-administered medications for this visit.     Allergies: Allergies  Allergen Reactions  . Altace [Ramipril] Other (See Comments)    Mouth swelling  . Mucinex [Guaifenesin Er] Hives  . Contrast Media [Iodinated Diagnostic Agents] Other (See Comments)    Social History: The patient  reports that he has never smoked. He has never used smokeless tobacco. He reports that he does not drink alcohol or use drugs.   Family History: The patient's family history includes Cancer in his maternal grandfather and mother; Heart disease in his father; Stroke in his maternal grandmother.   Review of Systems: Please see the history of present illness.   Otherwise, the review of systems is  positive for none.   All other systems are reviewed and negative.   Physical Exam: VS:  BP (!) 150/100   Pulse 60   Ht 5\' 9"  (1.753 m)   Wt 210 lb (95.3 kg)   BMI 31.01 kg/m  .  BMI Body mass index is 31.01 kg/m.   BP was 180/90 by me.   Wt Readings from Last 3 Encounters:  12/02/15 210 lb (95.3 kg)  11/23/15 212 lb 12.8 oz (96.5 kg)  05/03/15 214 lb 11.2 oz (97.4 kg)    General: Pleasant. Well developed, well nourished and  in no acute distress.   HEENT: Normal.  Neck: Supple, no JVD, carotid bruits, or masses noted.  Cardiac: Regular rate and rhythm. Outflow murmur noted. No edema.  Respiratory:  Lungs are clear to auscultation bilaterally with normal work of breathing.  GI: Soft and nontender.  MS: No deformity or atrophy. Gait and ROM intact.  Skin: Warm and dry. Color is normal.  Neuro:  Strength and sensation are intact and no gross focal deficits noted.  Psych: Alert, appropriate and with normal affect.   LABORATORY DATA:  EKG:  EKG is ordered today. This demonstrates NSR - rate is 60.  Lab Results  Component Value Date   WBC 8.1 11/23/2015   HGB 12.0 (L) 11/23/2015   HCT 36.6 (L) 11/23/2015   PLT 169 11/23/2015   GLUCOSE 76 11/23/2015   CHOL 92 11/21/2015   TRIG 189 (H) 11/21/2015   HDL 28 (L) 11/21/2015   LDLCALC 26 11/21/2015   ALT 19 11/20/2015   AST 20 11/20/2015   NA 140 11/23/2015   K 4.7 11/23/2015   CL 109 11/23/2015   CREATININE 1.53 (H) 11/23/2015   BUN 25 (H) 11/23/2015   CO2 21 (L) 11/23/2015   TSH 2.649 11/20/2015   INR 0.99 01/22/2015    BNP (last 3 results)  Recent Labs  11/20/15 1420  BNP 605.3*    ProBNP (last 3 results) No results for input(s): PROBNP in the last 8760 hours.   Other Studies Reviewed Today: Echo 11/21/2015 LV EF: 55% - 60%  Study Conclusions  - Left ventricle: The cavity size was normal. There was mild focal  basal hypertrophy of the septum. Systolic function was normal.  The estimated ejection  fraction was in the range of 55% to 60%.  Wall motion was normal; there were no regional wall motion  abnormalities. Left ventricular diastolic function parameters  were normal. - Aortic valve: Trileaflet; moderately thickened, moderately  calcified leaflets. Valve mobility was restricted. There was mild  stenosis. Peak velocity (S): 287 cm/s. Valve area (VTI): 1.77  cm^2. Valve area (Vmax): 1.75 cm^2. Valve area (Vmean): 1.5 cm^2. - Mitral valve: There was mild regurgitation. - Left atrium: The atrium was moderately dilated. - Pulmonary arteries: Systolic pressure was mildly increased. PA  peak pressure: 43 mm Hg (S).  Impressions:  - Compared to the prior study, there has been no significant  interval change.   Assessment/Plan: 1. PAF - remains in NSR. He is on anticoagulation.  2. Profound bradycardia while in atrial fibrillation and under the influence of the combined therapy of diltiazem (120 mg per day) and metoprolol (succinate 100 mg) - back in NSR and only on 1/2 dose of beta blocker - would be very hesitant going forward to increasing this dose.   3. HTN - he is to monitor his BP for me over the next 2 weeks.   4. CAD with remote CABG and prior PCI - no active symptoms.   5. CKD - recheck BMET today.  6. Chronic anticoagulation - no problems noted. Recheck lab today  7. Situational stress with dog's illness  Current medicines are reviewed with the patient today.  The patient does not have concerns regarding medicines other than what has been noted above.  The following changes have been made:  See above.  Labs/ tests ordered today include:    Orders Placed This Encounter  Procedures  . Basic metabolic panel  . CBC  . EKG 12-Lead     Disposition:  FU with me in 2 weeks with BP diary. He is to call me later today if BP is high at home and not better than when I checked.    Patient is agreeable to this plan and will call if any problems develop  in the interim.   Signed: Rosalio Macadamia, RN, ANP-C 12/02/2015 11:47 AM  [facility] [address] [place], [place]  [address] Phone: [phone] Fax: [phone]

## 2015-12-02 NOTE — Patient Instructions (Addendum)
We will be checking the following labs today - BMET and CBC   Medication Instructions:    Continue with your current medicines.     Testing/Procedures To Be Arranged:  N/A  Follow-Up:   See me in 2 weeks     Other Special Instructions:   BP diary over the next 2 weeks  Bring your BP cuff with you on next visit    If you need a refill on your cardiac medications before your next appointment, please call your pharmacy.   Call the [facility] office at [phone] if you have any questions, problems or concerns.

## 2015-12-06 ENCOUNTER — Telehealth: Payer: Self-pay | Admitting: Nurse Practitioner

## 2015-12-06 NOTE — Telephone Encounter (Signed)
Pt is aware of lab results.  Will route to Dory Horn, Charity fundraiser

## 2015-12-06 NOTE — Telephone Encounter (Signed)
New Message:    Pt had lab work on 12-02-15,would like the results please.

## 2015-12-10 ENCOUNTER — Telehealth: Payer: Self-pay | Admitting: Nurse Practitioner

## 2015-12-10 ENCOUNTER — Other Ambulatory Visit: Payer: Self-pay | Admitting: *Deleted

## 2015-12-10 MED ORDER — AMLODIPINE BESYLATE 5 MG PO TABS: 5.0000 mg | ORAL_TABLET | Freq: Every day | ORAL | 3 refills | Status: DC

## 2015-12-10 NOTE — Telephone Encounter (Signed)
Walk In pt form-BP Readings dropped off-Gave to Danielle/Lori G.

## 2015-12-16 ENCOUNTER — Encounter (INDEPENDENT_AMBULATORY_CARE_PROVIDER_SITE_OTHER): Payer: Self-pay

## 2015-12-16 ENCOUNTER — Ambulatory Visit (INDEPENDENT_AMBULATORY_CARE_PROVIDER_SITE_OTHER): Payer: Medicare Other | Admitting: Nurse Practitioner

## 2015-12-16 ENCOUNTER — Encounter: Payer: Self-pay | Admitting: Nurse Practitioner

## 2015-12-16 VITALS — BP 150/90 | HR 61 | Ht 68.0 in | Wt 208.1 lb

## 2015-12-16 DIAGNOSIS — Z951 Presence of aortocoronary bypass graft: Secondary | ICD-10-CM

## 2015-12-16 DIAGNOSIS — I10 Essential (primary) hypertension: Secondary | ICD-10-CM

## 2015-12-16 DIAGNOSIS — I480 Paroxysmal atrial fibrillation: Secondary | ICD-10-CM

## 2015-12-16 DIAGNOSIS — I25810 Atherosclerosis of coronary artery bypass graft(s) without angina pectoris: Secondary | ICD-10-CM | POA: Diagnosis not present

## 2015-12-16 LAB — CBC
HCT: 38.5 % (ref 38.5–50.0)
Hemoglobin: 12.9 g/dL — ABNORMAL LOW (ref 13.2–17.1)
MCH: 30 pg (ref 27.0–33.0)
MCHC: 33.5 g/dL (ref 32.0–36.0)
MCV: 89.5 fL (ref 80.0–100.0)
MPV: 11.2 fL (ref 7.5–12.5)
Platelets: 223 10*3/uL (ref 140–400)
RBC: 4.3 MIL/uL (ref 4.20–5.80)
RDW: 13.2 % (ref 11.0–15.0)
WBC: 6.7 10*3/uL (ref 3.8–10.8)

## 2015-12-16 LAB — BASIC METABOLIC PANEL
BUN: 35 mg/dL — ABNORMAL HIGH (ref 7–25)
CO2: 21 mmol/L (ref 20–31)
Calcium: 9 mg/dL (ref 8.6–10.3)
Chloride: 111 mmol/L — ABNORMAL HIGH (ref 98–110)
Creat: 2 mg/dL — ABNORMAL HIGH (ref 0.70–1.18)
Glucose, Bld: 93 mg/dL (ref 65–99)
Potassium: 4.2 mmol/L (ref 3.5–5.3)
Sodium: 142 mmol/L (ref 135–146)

## 2015-12-16 NOTE — Patient Instructions (Addendum)
We will be checking the following labs today - BMET and CBC   Medication Instructions:    Continue with your current medicines.     Testing/Procedures To Be Arranged:  N/A  Follow-Up:   See Dr. Tresa EndoKelly back as planned next month.     Other Special Instructions:   Continue your BP diary  Try to monitor your pulse and BP if you have a dizzy spell    If you need a refill on your cardiac medications before your next appointment, please call your pharmacy.   Call the [facility] office at [phone] if you have any questions, problems or concerns.

## 2015-12-16 NOTE — Progress Notes (Signed)
CARDIOLOGY OFFICE NOTE  Date:  12/16/2015    Doylene BodeGene S Beber Date of Birth: 12/02/1935 Medical Record #[id number]  PCP:  Hoyle SauerAVVA,RAVISANKAR R, MD  Cardiologist:  Tresa EndoKelly    Chief Complaint  Patient presents with  . Atrial Fibrillation  . Hypertension  . Coronary Artery Disease    2 week check - seen for Dr. Tresa EndoKelly    History of Present Illness: Densel Saundra ShellingS Voorhees is a 80 y.o. male who presents today for a 2 week check. Seen for Dr. Tresa EndoKelly.   He has a history of CAD dating back to 1994 at which time he had CABG in 1990 with LIMA to his LAD, SVG to his diagonal, saphenous vein to his marginal, and SVG to his right coronary artery. In October of 2003 he had PCI with stenting of the proximal portion of the SVG to the DX. Diffuse disease in the distal apicla portion of the LAD beyond the LIMA insertion which was to be treated medically. Other issues include AS, diastolic dysfunction, LVH, renal cysts, HTN and HLD. He had a hospital stay with CP in September 2016 with low risk stress test with an inferolateral wall defect Dr. Tresa EndoKelly reported as unchanged from previous study.  Last seen in the office back in December.   Presented in July with a several month history of exertional chest pain, asssociated with weakness. Brought to the ER by EMS - HR in the 30's with narrow complex escape rhythm and possible AF.   He was started on dpamine. BB and CCB therapy held due to marked hypotension. Seen by EP and plan was to observe until washout of his AV nodal agents. HR 60 to 70's, did not require PPM. Did convert spontaneously to NSR. Placed on Eliquis. Echo showed Ef of 55 to 60%. Looks like his beta blocker was restarted at half dose and his Diltiazem continued to be held at discharge.   I saw him back about 2 weeks ago in [place] - little stress [facility] BP was high - told me that at home it was better but when he started actually tracking it, he called back with an  updated and we added 5 mg of Norvasc.    Comes back today. Here with his wife. He history is a little hard to follow. He says he has been dizzy and it is related to a low BP - his BP diary does not really show any low readings or even soft ones. He has four readings that are normal - otherwise the remainder are elevated. He then tells me he has only been dizzy twice since his original discharge. HR seems to be just in the low 60's. While he is not able to tell me his medicines - his system is a 3 shelf where he has the AM meds on top, his TID med is on the middle and his nighttime meds are on the last shelf. He does not miss any doses. BP seems highest in the am - he says that is typically unusual for him.  No chest pain. Not short of breath. No syncope noted.   Past Medical History:  Diagnosis Date  . Aortic valve disorder    2D ECHO, 10/19/2011 - EF >55%, LA mild-moderately dilated, mild-moderate tricuspid regurgitation, mild-moderate valvular aortic stenosis, moderate calcification of aortic valve leaflets  . Atherosclerosis of renal artery (HCC)    RENAL DOPPLER, 12/10/2011 - Left renal artery demonstrated narrowing with  elevated velocities consistent with a 1-59% diameter reduction  . Coronary artery disease   . Hypertension     Past Surgical History:  Procedure Laterality Date  . CARDIAC CATHETERIZATION  02/27/2004   Coronary intervention and medical management  . CARDIAC CATHETERIZATION  03/04/2004   SVG supplying the diagonal vessel stented with a 3.5x80mm Taxus stent post dilated to 4.0 mm  . CATARACT EXTRACTION    . CORONARY ARTERY BYPASS GRAFT    . CORONARY STENT PLACEMENT    . INGUINAL HERNIA REPAIR Right   . TONSILLECTOMY AND ADENOIDECTOMY       Medications: Current Outpatient Prescriptions  Medication Sig Dispense Refill  . albuterol (PROAIR HFA) 108 (90 Base) MCG/ACT inhaler INHALE TWO PUFFS EVERY 4-6 HOURS AS NEEDED FOR COUGH OR WHEEZE 1 Inhaler 1  . amLODipine (NORVASC)  5 MG tablet Take 1 tablet (5 mg total) by mouth daily. 30 tablet 3  . apixaban (ELIQUIS) 5 MG TABS tablet Take 1 tablet (5 mg total) by mouth 2 (two) times daily. 60 tablet 11  . atorvastatin (LIPITOR) 40 MG tablet Take 20 mg by mouth daily.    . Azelastine-Fluticasone (DYMISTA) 137-50 MCG/ACT SUSP USE ONE SPRAY IN EACH NOSTRIL TWICE DAILY FOR STUFFY NOSE OR DRAINAGE. 23 g 4  . beclomethasone (QVAR) 40 MCG/ACT inhaler Inhale 1 puff into the lungs daily.    . Cholecalciferol (VITAMIN D-3 PO) Take 1,000 Units by mouth daily.     [name] ezetimibe (ZETIA) 10 MG tablet TAKE 1 TABLET (10 MG TOTAL) BY MOUTH DAILY. 90 tablet 1  . fluticasone (FLOVENT HFA) 110 MCG/ACT inhaler Use 3 puffs 3 times daily as directed for asthma flareup 12 g 5  . hydrALAZINE (APRESOLINE) 25 MG tablet Take 1 tablet (25 mg total) by mouth 3 (three) times daily. 90 tablet 6  . isosorbide mononitrate (IMDUR) 60 MG 24 hr tablet Take 1 tablet (60 mg total) by mouth daily. 30 tablet 6  . loratadine (CLARITIN) 10 MG tablet Take 10 mg by mouth daily.    . metoprolol succinate (TOPROL-XL) 50 MG 24 hr tablet Take 1 tablet (50 mg total) by mouth daily. Take with or immediately following a meal. 30 tablet 6  . nitroGLYCERIN (NITROLINGUAL) 0.4 MG/SPRAY spray Place 1 spray under the tongue every 5 (five) minutes x 3 doses as needed for chest pain. 12 g 2  . pantoprazole (PROTONIX) 40 MG tablet Take 40 mg by mouth daily.    [name] spironolactone (ALDACTONE) 25 MG tablet TAKE 1/2 TABLET (12.5 MG TOTAL) BY MOUTH DAILY. 15 tablet 2  . telmisartan (MICARDIS) 80 MG tablet Take 80 mg by mouth daily.    [name] torsemide (DEMADEX) 10 MG tablet Take 1 tablet (10 mg total) by mouth daily. 30 tablet 6  . vitamin C (ASCORBIC ACID) 500 MG tablet Take 500 mg by mouth daily.    . vitamin E 400 UNIT capsule Take 400 Units by mouth daily.    [name] zinc gluconate 50 MG tablet Take 50 mg by mouth 2 (two) times a week.    . fenofibrate (TRICOR) 145 MG tablet      No current  facility-administered medications for this visit.     Allergies: Allergies  Allergen Reactions  . Altace [Ramipril] Other (See Comments)    Mouth swelling  . Mucinex [Guaifenesin Er] Hives  . Contrast Media [Iodinated Diagnostic Agents] Other (See Comments)    Social History: The patient  reports that he has never smoked. He has never  used smokeless tobacco. He reports that he does not drink alcohol or use drugs.   Family History: The patient's family history includes Cancer in his maternal grandfather and mother; Heart disease in his father; Stroke in his maternal grandmother.   Review of Systems: Please see the history of present illness.   Otherwise, the review of systems is positive for none.   All other systems are reviewed and negative.   Physical Exam: VS:  BP (!) 150/90   Pulse 61   Ht  (1.727 m)   Wt 208 lb 1.9 oz (94.4 kg)   SpO2 96% Comment: at rest  BMI 31.64 kg/m  .  BMI Body mass index is 31.64 kg/m.  Wt Readings from Last 3 Encounters:  12/16/15 208 lb 1.9 oz (94.4 kg)  12/02/15 210 lb (95.3 kg)  11/23/15 212 lb 12.8 oz (96.5 kg)   BP by me is 140/80. Should be noted that his cuff is about 10 points higher than ours.   General: Pleasant. Well developed, well nourished and in no acute distress.   HEENT: Normal.  Neck: Supple, no JVD, carotid bruits, or masses noted.  Cardiac: Regular rate and rhythm. Outflow murmur. No edema.  Respiratory:  Lungs are clear to auscultation bilaterally with normal work of breathing.  GI: Soft and nontender.  MS: No deformity or atrophy. Gait and ROM intact.  Skin: Warm and dry. Color is normal.  Neuro:  Strength and sensation are intact and no gross focal deficits noted.  Psych: Alert, appropriate and with normal affect.   LABORATORY DATA:  EKG:  EKG is not ordered today.  Lab Results  Component Value Date   WBC 7.6 12/02/2015   HGB 12.8 (L) 12/02/2015   HCT 37.9 (L) 12/02/2015   PLT 270 12/02/2015    GLUCOSE 95 12/02/2015   CHOL 92 11/21/2015   TRIG 189 (H) 11/21/2015   HDL 28 (L) 11/21/2015   LDLCALC 26 11/21/2015   ALT 19 11/20/2015   AST 20 11/20/2015   NA 143 12/02/2015   K 4.4 12/02/2015   CL 110 12/02/2015   CREATININE 1.46 (H) 12/02/2015   BUN 27 (H) 12/02/2015   CO2 22 12/02/2015   TSH 2.649 11/20/2015   INR 0.99 01/22/2015    BNP (last 3 results)  Recent Labs  11/20/15 1420  BNP 605.3*    ProBNP (last 3 results) No results for input(s): PROBNP in the last 8760 hours.   Other Studies Reviewed Today:  Echo 11/21/2015 LV EF: 55% - 60%  Study Conclusions  - Left ventricle: The cavity size was normal. There was mild focal  basal hypertrophy of the septum. Systolic function was normal.  The estimated ejection fraction was in the range of 55% to 60%.  Wall motion was normal; there were no regional wall motion  abnormalities. Left ventricular diastolic function parameters  were normal. - Aortic valve: Trileaflet; moderately thickened, moderately  calcified leaflets. Valve mobility was restricted. There was mild  stenosis. Peak velocity (S): 287 cm/s. Valve area (VTI): 1.77  cm^2. Valve area (Vmax): 1.75 cm^2. Valve area (Vmean): 1.5 cm^2. - Mitral valve: There was mild regurgitation. - Left atrium: The atrium was moderately dilated. - Pulmonary arteries: Systolic pressure was mildly increased. PA  peak pressure: 43 mm Hg (S).  Impressions:  - Compared to the prior study, there has been no significant  interval change.   Assessment/Plan: 1. PAF - remains in NSR by exam today. He is on anticoagulation. Recheck  lab today.   2. Profound bradycardia while in atrial fibrillation and under the influence of the combined therapy of diltiazem (120 mg per day) and metoprolol (succinate 100 mg) - back in NSR and only on 1/2 dose of beta blocker - would be very hesitant going forward to increasing this dose. His HR seems to be ok at this time.    3. HTN - now on low dose Norvasc - he has just started this in the past week - I think we should just leave him on his current regimen for now and let him monitor for the next few weeks. He has an upcoming appointment.   4. CAD with remote CABG and prior PCI - no active symptoms.   5. CKD - recheck BMET today.  6. Chronic anticoagulation - no problems noted.   7. Aortic stenosis - mild by recent echo  Current medicines are reviewed with the patient today.  The patient does not have concerns regarding medicines other than what has been noted above.  The following changes have been made:  See above.  Labs/ tests ordered today include:    Orders Placed This Encounter  Procedures  . Basic metabolic panel  . CBC     Disposition:   FU with Dr. Tresa Endo next month as planned.   Patient is agreeable to this plan and will call if any problems develop in the interim.   Signed: Rosalio Macadamia, RN, ANP-C 12/16/2015 11:05 AM  [facility] [address] [place], [place]  [address] Phone: [phone] Fax: [phone]

## 2015-12-28 ENCOUNTER — Other Ambulatory Visit: Payer: Self-pay | Admitting: Cardiovascular Disease

## 2016-01-10 ENCOUNTER — Ambulatory Visit (INDEPENDENT_AMBULATORY_CARE_PROVIDER_SITE_OTHER): Payer: Medicare Other | Admitting: Cardiovascular Disease

## 2016-01-10 ENCOUNTER — Encounter: Payer: Self-pay | Admitting: Cardiovascular Disease

## 2016-01-10 VITALS — BP 130/76 | HR 64 | Ht 68.0 in | Wt 205.0 lb

## 2016-01-10 DIAGNOSIS — I480 Paroxysmal atrial fibrillation: Secondary | ICD-10-CM | POA: Diagnosis not present

## 2016-01-10 DIAGNOSIS — I10 Essential (primary) hypertension: Secondary | ICD-10-CM

## 2016-01-10 LAB — BASIC METABOLIC PANEL
BUN: 26 mg/dL — AB (ref 7–25)
CALCIUM: 9 mg/dL (ref 8.6–10.3)
CHLORIDE: 113 mmol/L — AB (ref 98–110)
CO2: 24 mmol/L (ref 20–31)
CREATININE: 1.74 mg/dL — AB (ref 0.70–1.18)
GLUCOSE: 102 mg/dL — AB (ref 65–99)
POTASSIUM: 3.9 mmol/L (ref 3.5–5.3)
SODIUM: 143 mmol/L (ref 135–146)

## 2016-01-10 MED ORDER — AMLODIPINE BESYLATE 5 MG PO TABS: ORAL_TABLET | ORAL | 3 refills | Status: DC

## 2016-01-10 NOTE — Patient Instructions (Signed)
Your physician recommends that you return for lab work in today.  Your physician has recommended you make the following change in your medication: decrease the amlodipine to 1/2 tablet daily.  Monitor your blood pressure. If needed you can go back to 1 tablet.  Your physician recommends that you schedule a follow-up appointment in: 3 months with Dr Tresa EndoKelly.

## 2016-01-11 NOTE — Progress Notes (Signed)
Patient ID: Jorge Cherry, male   DOB: August 10, 1935, 80 y.o.   MRN: [id number]    Primary MD: Dr. Dagmar Hait  HPI: Jorge Cherry is a 80 y.o. male  who presents to the office today for a 9 month followup cardiology evaluation.  Jorge Cherry has established CAD dating back to 1994 at which time he underwent CABG revascularization surgery. In October 2003 he underwent stenting to the proximal portion of the vein graft supplying the diagonal vessel with a 3.5x16 mm Taxus DES stent post dilated to 4.0 mm. He has diffuse disease in the distal apical portion of the LAD beyond the LIMA insertion  treated medically. He has documented mild aortic valve stenosis with grade 2 diastolic dysfunction with concentric left ventricular hypertrophy. He has documented renal cysts, history of hypertension, mixed hyperlipidemia. His last Myoview study was in June 2013 which showed a minimal apical defect. Post-stess ejection fraction was 56%.  In March 2014 he was complaining that at times he felt like he was "zoning out."  At that time, I reduced his diltiazem from 300 mg to 240 mg. He felt that this has significantly improved his symptoms with this change and he denies any further sensation. On echo Doppler study, his peak instantaneous gradient across his aortic valve is 25 mm with a mean gradient of only 12 mm an aortic valve area 1.8 cm.   He has hyperlipidemia and in June 2014 his triglycerides were 231 and I further titrated his fish oil to 2 capsules twice a day. Repeat blood work in August 2014 week  showed a BUN of 26 Cr1.67 which improved from  1.71 in June. His lipid panel was improved with a total cholesterol from 172-150. Triglycerides improved from 231-151. HDL remained low at 34. LDL was 86.  A follow-up echo Doppler study on 07/26/2013 showed an ejection fraction of 55-60%.  He had normal diastolic function.  There was evidence for mild aortic valve stenosis with a mean gradient of 11 and a peak gradient of 21 with an  estimated aortic valve area of 1.54 cm.  He had mild left atrial dilatation.   An NMR profile  showed increased LDL particle #[id number] despite a calculated LDL of 69.  Triglycerides were still elevated at 182 and HDL cholesterol was low at 32.  Insulin resistance score was increased at 77.  TSH was normal.  He was hospitalized from May 16 through 09/26/2014 with a lower GI bleed due to diverticulosis of the colon with hemorrhage.  He did not undergo colonoscopy.  At that time, he was told to hold his eliquis and aspirin and to resume this on May 30.    When I saw him in follow-up of that hospitalization he was not having any chest pain or shortness of breath.  I recommended that he not restart Effient but instead start Plavix initially and if he tolerated this from a GI standpoint to then resume 81 mg aspirin.   I last saw him a month ago, he complained of blood pressure lability recently with at times recorded blood pressures close to 200 and as low as 100.  When his blood pressures have been significantly elevated.  He is taking garlic tablets and he states this has resulted in a 20 mm drop.  He was on my Cardis 80 mg, torsemide 20 mg twice a day, Spironolactone 12.5 mg daily, Toprol-XL 100 mg daily in addition to Cardizem CD 240 mg.  He is unaware of any recurrent  arrhythmia.  He was evaluated in the hospital on 01/22/2015.  He had somewhat atypical chest pain that was different from his ischemic chest pain and felt like his previous reflux.  His pain was not responsive to nitroglycerin.  He was evaluated in the hospital.  Troponins were negative.  He underwent a Lexiscan Myoview study which was low risk and there was no change in the previously noted small, medium intensity defect in the distal inferolateral wall and apex.  Ejection fraction was 54%.  He subsequently underwent an echo Doppler study on 01/31/2015 which showed an EF of 55-60%.  There was mild LVH.  There was aortic stenosis which visually  appeared moderate but was mild by mean gradient at 15 mm with a peak gradient of 27 mm.  PA pressure was 29 mm.    Since I last saw him in December 2016, he was hospitalized in July 2017 and was in atrial fibrillation with a slow ventricular rate for which she was started on dopamine and hypotension.  He spontaneously cardioverted to sinus rhythm and heparin therapy was switched to eloquence.  A follow-up echo Doppler study showed an EF of 55-60% without wall motion abnormality, mild MR, mild directly dilated LA, and PA pressure 43 mm.  His blood pressure and heart rate remained stable on antihypertensive regimen consisting of hydralazine, spironolactone,  micardis, torsemide and Toprol.  His Cardizem had been discontinued.  He was seen in the office for follow-up evaluation by Kathrynn Humble and was last seen on 12/16/2015.  Presently, he denies any awareness of recurrent atrial fibrillation.  He denies any chest pain.  His hospitalization.  He was taken off aspirin and Plavix when he was started on eloquence.  He does admit to some very mild right leg/ankle swelling since the addition of amlodipine to his medical regimen was added because of his blood pressure.  He continues to be on atorvastatin 40 mg, Zetia 10 mg, and fenofibrate for his hyperlipidemia.  He presents for reevaluation.  Past Medical History:  Diagnosis Date  . Aortic valve disorder    2D ECHO, 10/19/2011 - EF >55%, LA mild-moderately dilated, mild-moderate tricuspid regurgitation, mild-moderate valvular aortic stenosis, moderate calcification of aortic valve leaflets  . Atherosclerosis of renal artery ([place])    RENAL DOPPLER, 12/10/2011 - Left renal artery demonstrated narrowing with elevated velocities consistent with a 1-59% diameter reduction  . Coronary artery disease   . Hypertension     Past Surgical History:  Procedure Laterality Date  . CARDIAC CATHETERIZATION  02/27/2004   Coronary intervention and medical management  .  CARDIAC CATHETERIZATION  03/04/2004   SVG supplying the diagonal vessel stented with a 3.5x13m Taxus stent post dilated to 4.0 mm  . CATARACT EXTRACTION    . CORONARY ARTERY BYPASS GRAFT    . CORONARY STENT PLACEMENT    . INGUINAL HERNIA REPAIR Right   . TONSILLECTOMY AND ADENOIDECTOMY      Allergies  Allergen Reactions  . Altace [Ramipril] Other (See Comments)    Mouth swelling  . Mucinex [Guaifenesin Er] Hives  . Contrast Media [Iodinated Diagnostic Agents] Other (See Comments)    Current Outpatient Prescriptions  Medication Sig Dispense Refill  . albuterol (PROAIR HFA) 108 (90 Base) MCG/ACT inhaler INHALE TWO PUFFS EVERY 4-6 HOURS AS NEEDED FOR COUGH OR WHEEZE 1 Inhaler 1  . amLODipine (NORVASC) 5 MG tablet Take 1/2-1 tablet daily as directed 30 tablet 3  . apixaban (ELIQUIS) 5 MG TABS tablet Take 1 tablet (5  mg total) by mouth 2 (two) times daily. 60 tablet 11  . atorvastatin (LIPITOR) 20 MG tablet Take 1 tablet by mouth daily.    . Azelastine-Fluticasone (DYMISTA) 137-50 MCG/ACT SUSP USE ONE SPRAY IN EACH NOSTRIL TWICE DAILY FOR STUFFY NOSE OR DRAINAGE. 23 g 4  . beclomethasone (QVAR) 40 MCG/ACT inhaler Inhale 1 puff into the lungs daily.    . Cholecalciferol (VITAMIN D-3 PO) Take 1,000 Units by mouth daily.     [name] ezetimibe (ZETIA) 10 MG tablet TAKE 1 TABLET (10 MG TOTAL) BY MOUTH DAILY. 90 tablet 1  . fenofibrate (TRICOR) 145 MG tablet     . fluticasone (FLOVENT HFA) 110 MCG/ACT inhaler Use 3 puffs 3 times daily as directed for asthma flareup 12 g 5  . hydrALAZINE (APRESOLINE) 25 MG tablet Take 1 tablet (25 mg total) by mouth 3 (three) times daily. 90 tablet 6  . isosorbide mononitrate (IMDUR) 60 MG 24 hr tablet Take 1 tablet (60 mg total) by mouth daily. 30 tablet 6  . loratadine (CLARITIN) 10 MG tablet Take 10 mg by mouth daily.    . metoprolol succinate (TOPROL-XL) 50 MG 24 hr tablet Take 1 tablet (50 mg total) by mouth daily. Take with or immediately following a meal. 30  tablet 6  . nitroGLYCERIN (NITROLINGUAL) 0.4 MG/SPRAY spray Place 1 spray under the tongue every 5 (five) minutes x 3 doses as needed for chest pain. 12 g 2  . pantoprazole (PROTONIX) 40 MG tablet Take 40 mg by mouth daily.    [name] spironolactone (ALDACTONE) 25 MG tablet TAKE 1/2 TABLET (12.5 MG TOTAL) BY MOUTH DAILY. 15 tablet 2  . telmisartan (MICARDIS) 80 MG tablet Take 80 mg by mouth daily.    [name] torsemide (DEMADEX) 10 MG tablet Take 1 tablet (10 mg total) by mouth daily. 30 tablet 6  . vitamin C (ASCORBIC ACID) 500 MG tablet Take 500 mg by mouth daily.    . vitamin E 400 UNIT capsule Take 400 Units by mouth daily.    [name] zinc gluconate 50 MG tablet Take 50 mg by mouth 2 (two) times a week.     No current facility-administered medications for this visit.     Socially he is married and has 2 children and 4 grandchildren. There is no tobacco alcohol use. Recently he was has been very active.  ROS General: Negative; No fevers, chills, or night sweats;  HEENT: He is hard of hearing; no visual changes, sinus congestion, difficulty swallowing Pulmonary: Negative; No cough, wheezing, shortness of breath, hemoptysis Cardiovascular: Positive for aortic stenosis  No chest pain, presyncope, syncope, palpitations GI: Positive for recent GI bleed secondary to diverticular disease GU: Negative; No dysuria, hematuria, or difficulty voiding Musculoskeletal: Negative; no myalgias, joint pain, or weakness Hematologic/Oncology: Negative; no easy bruising, bleeding Endocrine: Negative; no heat/cold intolerance; no diabetes Neuro: Negative; no changes in balance, headaches Skin: Negative; No rashes or skin lesions Psychiatric: Negative; No behavioral problems, depression Sleep: Negative; No snoring, daytime sleepiness, hypersomnolence, bruxism, restless legs, hypnogognic hallucinations, no cataplexy Other comprehensive 14 point system review is negative.  PE BP 130/76 (BP Location: Left Arm, Patient  Position: Sitting, Cuff Size: Normal)   Pulse 64   Ht 5' 8"  (1.727 m)   Wt 205 lb (93 kg)   BMI 31.17 kg/m     Wt Readings from Last 3 Encounters:  01/10/16 205 lb (93 kg)  12/16/15 208 lb 1.9 oz (94.4 kg)  12/02/15 210 lb (95.3 kg)  General: Alert, oriented, no distress.  Skin: normal turgor, no rashes HEENT: Normocephalic, atraumatic. Pupils round and reactive; sclera anicteric;no lid lag,  Nose without nasal septal hypertrophy Mouth/Parynx benign; Mallinpatti scale 2/3 Neck: No JVD, no carotid bruits Lungs: clear to ausculatation and percussion; no wheezing or rales Heart: RRR, s1 s2 normal 2/6 sem in the aortic region. No S3 gallop. No diastolic murmur. No rubs thrills or heaves. Chest wall has a mild pectus excavatum deformity  Abdomen: soft, nontender; no hepatosplenomehaly, BS+; abdominal aorta nontender and not dilated by palpation. He does have mild diastases recti Back: No CVA tenderness Pulses 2+ Extremities: Trace edema in the R LE> LLE; no clubbing cyanosis, Homan's sign negative  Neurologic: grossly nonfocal; cranial nerves normal Psychological: Normal affect and mood  ECG (independently read by me): Normal sinus rhythm at 64 bpm.  LVH.  QTc interval 468 ms.  December 2016 ECG (independently read by me):  Sinus bradycardia 51 bpm.  No ectopy. QTc interval 429 ms.  LVH by voltage criteria in aVL.  September 2016 ECG (independently read by me):  Sinus bradycardia with first-degree AV block. RV conduction delay. No significant ST-T changes.   August 2016ECG (independently read by me): Sinus bradycardia 57 bpm.  Mild RV conduction delay.  May 2016 ECG (independently read by me): Sinus bradycardia at 49 bpm with first-degree AV block with a PR interval 218 ms.  Right reticular conduction delay.  December 2015 ECG (independently read by me).  For these: Sinus bradycardia 57 bpm.  First-degree AV block with a PR interval at 224 ms.  No significant ST-T changes.  March  2015 ECG (independently read by me): Sinus rhythm at 59 beats per minute. Mild LVH by voltage criteria in aVL. Normal intervals.  Prior 01/05/2013 ECG: Normal sinus rhythm at 62. Mild RV conduction delay. PR interval 204 ms, QTc interval 452 ms.  LABS: BMP Latest Ref Rng & Units 01/10/2016 12/16/2015 12/02/2015  Glucose 65 - 99 mg/dL 102(H) 93 95  BUN 7 - 25 mg/dL 26(H) 35(H) 27(H)  Creatinine 0.70 - 1.18 mg/dL 1.74(H) 2.00(H) 1.46(H)  Sodium 135 - 146 mmol/L 143 142 143  Potassium 3.5 - 5.3 mmol/L 3.9 4.2 4.4  Chloride 98 - 110 mmol/L 113(H) 111(H) 110  CO2 20 - 31 mmol/L 24 21 22   Calcium 8.6 - 10.3 mg/dL 9.0 9.0 9.3   Hepatic Function Latest Ref Rng & Units 11/20/2015 09/24/2014 04/09/2014  Total Protein 6.5 - 8.1 g/dL 5.9(L) 5.8(L) 6.5  Albumin 3.5 - 5.0 g/dL 3.4(L) 3.3(L) 4.0  AST 15 - 41 U/L 20 20 18   ALT 17 - 63 U/L 19 21 15   Alk Phosphatase 38 - 126 U/L 39 43 46  Total Bilirubin 0.3 - 1.2 mg/dL 0.6 0.5 0.4   CBC Latest Ref Rng & Units 12/16/2015 12/02/2015 11/23/2015  WBC 3.8 - 10.8 K/uL 6.7 7.6 8.1  Hemoglobin 13.2 - 17.1 g/dL 12.9(L) 12.8(L) 12.0(L)  Hematocrit 38.5 - 50.0 % 38.5 37.9(L) 36.6(L)  Platelets 140 - 400 K/uL 223 270 169   Lab Results  Component Value Date   MCV 89.5 12/16/2015   MCV 90.5 12/02/2015   MCV 93.4 11/23/2015   Lab Results  Component Value Date   TSH 2.649 11/20/2015  No results found for: HGBA1C   Lipid Panel     Component Value Date/Time   CHOL 92 11/21/2015 0145   CHOL 137 04/09/2014 1115   TRIG 189 (H) 11/21/2015 0145   TRIG 182 (H) 04/09/2014 1115  HDL 28 (L) 11/21/2015 0145   HDL 32 (L) 04/09/2014 1115   CHOLHDL 3.3 11/21/2015 0145   VLDL 38 11/21/2015 0145   LDLCALC 26 11/21/2015 0145   LDLCALC 69 04/09/2014 1115     RADIOLOGY: No results found.    ASSESSMENT AND PLAN: Jorge Cherry is a 80 year old white male who is  23 years status post CABG revascularization surgery.  He is status post DES stenting to the graft supplying the  diagonal vessel 13 years ago.    He had  development of significant blood pressure lability requiring medication addition. He was recently admitted to the hospital in July 2017 with atrial fibrillation in the setting of bradycardia and hypotension.  His medications have been adjusted.  Resume has been on amlodipine 5 mg, drowsing 25 mg 3 times a day, isosorbide 60 mg daily, Toprol-XL 50 mg, spironolactone 12.5 mg, my Cardis 80 mg, and has also been taking low-dose torsemide 10 mg.  His blood pressure today is stable.  He admits to right lower extremity swelling.  He has had issues with increasing creatinine.  I reviewed recent blood work from August 7 1 units seen Alan Mulder in the office.  At that time his creatinine was 2.0.  We will stop torsemide and I will try to reduce amlodipine to 2.5 mg daily.  Follow-up blood work will be obtained today.  If his renal function worsens is micardis may need to be discontinued.  He is maintaining sinus rhythm without recurrent atrial fibrillation.  I reviewed his recent hospitalization and his most recent echo Doppler study on 11/20/2005.  Ejection fraction was 55-60%.  There was very mild aortic stenosis with a valve area of 1.75 mm per there was mild pulmonary hypertension.  I will contact him regarding his blood work and further adjustments to his medications if needed.  I will see him in follow-up in 2-3 months for reevaluation.  Time spent: 25 minutes  Troy Sine, MD, [facility]  01/11/2016 1:12 PM

## 2016-01-17 ENCOUNTER — Other Ambulatory Visit: Payer: Self-pay | Admitting: Cardiovascular Disease

## 2016-01-17 ENCOUNTER — Other Ambulatory Visit: Payer: Self-pay

## 2016-01-17 MED ORDER — HYDRALAZINE HCL 25 MG PO TABS: 25.0000 mg | ORAL_TABLET | Freq: Three times a day (TID) | ORAL | 6 refills | Status: DC

## 2016-01-17 NOTE — Telephone Encounter (Signed)
error 

## 2016-02-18 ENCOUNTER — Other Ambulatory Visit: Payer: Self-pay | Admitting: Allergy and Immunology

## 2016-02-19 ENCOUNTER — Other Ambulatory Visit: Payer: Self-pay

## 2016-02-19 ENCOUNTER — Other Ambulatory Visit: Payer: Self-pay | Admitting: *Deleted

## 2016-02-19 MED ORDER — SPIRONOLACTONE 25 MG PO TABS: ORAL_TABLET | ORAL | 2 refills | Status: DC

## 2016-02-19 MED ORDER — HYDRALAZINE HCL 25 MG PO TABS: 25.0000 mg | ORAL_TABLET | Freq: Three times a day (TID) | ORAL | 6 refills | Status: DC

## 2016-02-20 ENCOUNTER — Other Ambulatory Visit: Payer: Self-pay

## 2016-03-19 ENCOUNTER — Other Ambulatory Visit: Payer: Self-pay | Admitting: Allergy and Immunology

## 2016-03-24 ENCOUNTER — Encounter (INDEPENDENT_AMBULATORY_CARE_PROVIDER_SITE_OTHER): Payer: Self-pay

## 2016-03-24 ENCOUNTER — Encounter: Payer: Self-pay | Admitting: Allergy and Immunology

## 2016-03-24 ENCOUNTER — Ambulatory Visit (INDEPENDENT_AMBULATORY_CARE_PROVIDER_SITE_OTHER): Payer: Medicare Other | Admitting: Allergy and Immunology

## 2016-03-24 VITALS — BP 144/94 | HR 60 | Resp 18

## 2016-03-24 DIAGNOSIS — I480 Paroxysmal atrial fibrillation: Secondary | ICD-10-CM | POA: Diagnosis not present

## 2016-03-24 DIAGNOSIS — J4520 Mild intermittent asthma, uncomplicated: Secondary | ICD-10-CM | POA: Diagnosis not present

## 2016-03-24 DIAGNOSIS — J4530 Mild persistent asthma, uncomplicated: Secondary | ICD-10-CM | POA: Diagnosis not present

## 2016-03-24 DIAGNOSIS — K219 Gastro-esophageal reflux disease without esophagitis: Secondary | ICD-10-CM | POA: Diagnosis not present

## 2016-03-24 DIAGNOSIS — J3089 Other allergic rhinitis: Secondary | ICD-10-CM | POA: Diagnosis not present

## 2016-03-24 MED ORDER — PANTOPRAZOLE SODIUM 40 MG PO TBEC: 40.0000 mg | DELAYED_RELEASE_TABLET | Freq: Two times a day (BID) | ORAL | 5 refills | Status: DC

## 2016-03-24 MED ORDER — AZELASTINE-FLUTICASONE 137-50 MCG/ACT NA SUSP: NASAL | 5 refills | Status: DC

## 2016-03-24 MED ORDER — ALBUTEROL SULFATE HFA 108 (90 BASE) MCG/ACT IN AERS: INHALATION_SPRAY | RESPIRATORY_TRACT | 1 refills | Status: DC

## 2016-03-24 MED ORDER — IPRATROPIUM BROMIDE 0.06 % NA SOLN: NASAL | 5 refills | Status: DC

## 2016-03-24 NOTE — Progress Notes (Signed)
Follow-up Note  Referring Provider: Chilton GreathouseAvva, Ravisankar, MD Primary Provider: Hoyle SauerAVVA,RAVISANKAR R, MD Date of Office Visit: 03/24/2016  Subjective:   Jorge Cherry (DOB: 11/28/1935) is a 80 y.o. male who [facility] in re-evaluation of the following:  HPI: Jorge Cherry returns to this clinic in reevaluation of his asthma and allergic rhinoconjunctivitis and LPR. I've not seen him in this clinic since May 2017.  His asthma has been under excellent control. He has not had activate an action plan for an asthma flare nor receive a systemic steroids for an exacerbation. He can exert himself without much difficulty and he does not use a short acting bronchodilator greater than 1 time per week.  His nose has been doing quite well. He has not required an antibiotic for an episode of sinusitis. He does get problems with runny nose around the time that he eats while using his Dymista.  [facility] onset [facility] in July. Apparently he spontaneously converted. Jorge Cherry drinks 1 or 2 large teas per day.  Jorge Cherry did recently receive the flu vaccine this year.    Medication List      albuterol 108 (90 Base) MCG/ACT inhaler Commonly known as:  PROAIR HFA INHALE TWO PUFFS EVERY 4-6 HOURS AS NEEDED FOR COUGH OR WHEEZE   amLODipine 5 MG tablet Commonly known as:  NORVASC Take 1/2-1 tablet daily as directed   apixaban 5 MG Tabs tablet Commonly known as:  ELIQUIS Take 1 tablet (5 mg total) by mouth 2 (two) times daily.   atorvastatin 20 MG tablet Commonly known as:  LIPITOR Take 1 tablet by mouth daily.   Azelastine-Fluticasone 137-50 MCG/ACT Susp Commonly known as:  DYMISTA USE ONE SPRAY IN EACH NOSTRIL TWICE DAILY FOR STUFFY NOSE OR DRAINAGE.   beclomethasone 40 MCG/ACT inhaler Commonly known as:  QVAR Inhale 1 puff into the lungs daily.   ezetimibe 10 MG tablet Commonly known as:  ZETIA TAKE 1 TABLET (10 MG TOTAL)  BY MOUTH DAILY.   fenofibrate 145 MG tablet Commonly known as:  TRICOR   fluticasone 110 MCG/ACT inhaler Commonly known as:  FLOVENT HFA Use 3 puffs 3 times daily as directed for asthma flareup   hydrALAZINE 25 MG tablet Commonly known as:  APRESOLINE Take 1 tablet (25 mg total) by mouth 3 (three) times daily.   isosorbide mononitrate 60 MG 24 hr tablet Commonly known as:  IMDUR Take 1 tablet (60 mg total) by mouth daily.   loratadine 10 MG tablet Commonly known as:  CLARITIN Take 10 mg by mouth daily.   metoprolol succinate 50 MG 24 hr tablet Commonly known as:  TOPROL-XL Take 1 tablet (50 mg total) by mouth daily. Take with or immediately following a meal.   nitroGLYCERIN 0.4 MG/SPRAY spray Commonly known as:  NITROLINGUAL Place 1 spray under the tongue every 5 (five) minutes x 3 doses as needed for chest pain.   pantoprazole 40 MG tablet Commonly known as:  PROTONIX Take 40 mg by mouth daily.   pantoprazole 40 MG tablet Commonly known as:  PROTONIX TAKE 1 TABLET (40 MG TOTAL) BY MOUTH 2 (TWO) TIMES DAILY.   spironolactone 25 MG tablet Commonly known as:  ALDACTONE TAKE 1/2 TABLET (12.5 MG TOTAL) BY MOUTH DAILY.   telmisartan 80 MG tablet Commonly known as:  MICARDIS Take 80 mg by mouth daily.   torsemide 10 MG tablet Commonly known as:  DEMADEX Take  1 tablet (10 mg total) by mouth daily.   vitamin C 500 MG tablet Commonly known as:  ASCORBIC ACID Take 500 mg by mouth daily.   VITAMIN D-3 PO Take 1,000 Units by mouth daily.   vitamin E 400 UNIT capsule Take 400 Units by mouth daily.   zinc gluconate 50 MG tablet Take 50 mg by mouth 2 (two) times a week.       Past Medical History:  Diagnosis Date  . Aortic valve disorder    2D ECHO, 10/19/2011 - EF >55%, LA mild-moderately dilated, mild-moderate tricuspid regurgitation, mild-moderate valvular aortic stenosis, moderate calcification of aortic valve leaflets  . Atherosclerosis of renal artery ([facility])     RENAL DOPPLER, 12/10/2011 - Left renal artery demonstrated narrowing with elevated velocities consistent with a 1-59% diameter reduction  . Atrial fibrillation (H[facility])   . Coronary artery disease   . Hypertension     Past Surgical History:  Procedure Laterality Date  . CARDIAC CATHETERIZATION  02/27/2004   Coronary intervention and medical management  . CARDIAC CATHETERIZATION  03/04/2004   SVG supplying the diagonal vessel stented with a 3.5x77mm Taxus stent post dilated to 4.0 mm  . CATARACT EXTRACTION    . CORONARY ARTERY BYPASS GRAFT    . CORONARY STENT PLACEMENT    . INGUINAL HERNIA REPAIR Right   . TONSILLECTOMY AND ADENOIDECTOMY      Allergies  Allergen Reactions  . Altace [Ramipril] Other (See Comments)    Mouth swelling  . Mucinex [Guaifenesin Er] Hives  . Contrast Media [Iodinated Diagnostic Agents] Other (See Comments)    Review of systems negative except as noted in HPI / PMHx or noted below:  Review of Systems  Constitutional: Negative.   HENT: Negative.   Eyes: Negative.   Respiratory: Negative.   Cardiovascular: Negative.   Gastrointestinal: Negative.   Genitourinary: Negative.   Musculoskeletal: Negative.   Skin: Negative.   Neurological: Negative.   Endo/Heme/Allergies: Negative.   Psychiatric/Behavioral: Negative.      Objective:   Vitals:   03/24/16 1126  BP: (!) 144/94  Pulse: 60  Resp: 18          Physical Exam  Constitutional: He is well-developed, well-nourished, and in no distress.  HENT:  Head: Normocephalic.  Right Ear: Tympanic membrane, external ear and ear canal normal.  Left Ear: Tympanic membrane, external ear and ear canal normal.  Nose: Nose normal. No mucosal edema or rhinorrhea.  Mouth/Throat: Uvula is midline, oropharynx is clear and moist and mucous membranes are normal. No oropharyngeal exudate.  Right hearing aid  Eyes: Conjunctivae are normal.  Neck: Trachea normal. No tracheal tenderness present. No tracheal  deviation present. No thyromegaly present.  Cardiovascular: Normal rate, regular rhythm, S1 normal and S2 normal.   Murmur (systolic) heard. Pulmonary/Chest: Breath sounds normal. No stridor. No respiratory distress. He has no wheezes. He has no rales.  Musculoskeletal: He exhibits no edema.  Lymphadenopathy:       Head (right side): No tonsillar adenopathy present.       Head (left side): No tonsillar adenopathy present.    He has no cervical adenopathy.  Neurological: He is alert. Gait normal.  Skin: No rash noted. He is not diaphoretic. No erythema. Nails show no clubbing.  Psychiatric: Mood and affect normal.    Diagnostics:    Spirometry was performed and demonstrated an FEV1 of 2.95 at 109 % of predicted.  Assessment and Plan:   1. Asthma, mild intermittent, well-controlled  2. Other allergic rhinitis   3. LPRD (laryngopharyngeal reflux disease)   4. Paroxysmal atrial fibrillation (HCC)   5. Mild persistent asthma, uncomplicated     1. Continue action plan for asthma flare including Qvar 80 3 inhalations 3 times a day  2. Continue Dymista one spray each nostril one-2 times per day  3. Continue pantoprazole 40 mg daily  4. Continue ProAir HFA and antihistamine if needed  5. Can add ipratropium 0.06% nasal spray 2 sprays each nostril every 6 hours if needed to dry up nose. May use prior to eating.  6. Eliminate all caffeine consumption  7. Return to clinic in 6 months or earlier if problem  Jorge Cherry has had excellent control of his asthma and for the most part his upper airway disease with his current medical therapy. He does appear to have some gustatory rhinitis and he can add in nasal ipratropium to address this issue. His reflux also appears to be under very good control. I did make a recommendation to Jorge Cherry that he consider discontinuing all caffeine consumption especially with his new onset atrial fibrillation. If he does well I will see him back in this clinic in 6  months or earlier if there is a problem.  Laurette SchimkeEric Aulani Shipton, MD [facility]

## 2016-03-24 NOTE — Patient Instructions (Signed)
  1. Continue action plan for asthma flare including Qvar 80 3 inhalations 3 times a day  2. Continue Dymista one spray each nostril one-2 times per day  3. Continue pantoprazole 40 mg daily  4. Continue ProAir HFA and antihistamine if needed  5. Can add ipratropium 0.06% nasal spray 2 sprays each nostril every 6 hours if needed to dry up nose. May use prior to eating.  6. Eliminate all caffeine consumption  7. Return to clinic in 6 months or earlier if problem

## 2016-03-25 ENCOUNTER — Other Ambulatory Visit: Payer: Self-pay | Admitting: Cardiovascular Disease

## 2016-04-14 ENCOUNTER — Encounter: Payer: Self-pay | Admitting: Cardiovascular Disease

## 2016-04-14 ENCOUNTER — Ambulatory Visit (INDEPENDENT_AMBULATORY_CARE_PROVIDER_SITE_OTHER): Payer: Medicare Other | Admitting: Cardiovascular Disease

## 2016-04-14 VITALS — BP 161/75 | HR 57 | Ht 68.0 in | Wt 206.8 lb

## 2016-04-14 DIAGNOSIS — E785 Hyperlipidemia, unspecified: Secondary | ICD-10-CM | POA: Diagnosis not present

## 2016-04-14 DIAGNOSIS — I2510 Atherosclerotic heart disease of native coronary artery without angina pectoris: Secondary | ICD-10-CM | POA: Diagnosis not present

## 2016-04-14 DIAGNOSIS — I480 Paroxysmal atrial fibrillation: Secondary | ICD-10-CM | POA: Diagnosis not present

## 2016-04-14 DIAGNOSIS — I10 Essential (primary) hypertension: Secondary | ICD-10-CM | POA: Diagnosis not present

## 2016-04-14 DIAGNOSIS — Z79899 Other long term (current) drug therapy: Secondary | ICD-10-CM

## 2016-04-14 MED ORDER — HYDRALAZINE HCL 25 MG PO TABS: 37.5000 mg | ORAL_TABLET | Freq: Three times a day (TID) | ORAL | 6 refills | Status: DC

## 2016-04-14 NOTE — Patient Instructions (Addendum)
Your physician has recommended you make the following change in your medication:   1.) the hydralazine has been changed to 37.5 mg ( 1.5 tablets)  Every 8 hours.   Your physician recommends that you return for lab work in: TODAY.   Your physician recommends that you schedule a follow-up appointment in: 2 months.

## 2016-04-15 LAB — COMPREHENSIVE METABOLIC PANEL
ALK PHOS: 38 U/L — AB (ref 40–115)
ALT: 16 U/L (ref 9–46)
AST: 19 U/L (ref 10–35)
Albumin: 3.7 g/dL (ref 3.6–5.1)
BILIRUBIN TOTAL: 0.3 mg/dL (ref 0.2–1.2)
BUN: 22 mg/dL (ref 7–25)
CALCIUM: 8.8 mg/dL (ref 8.6–10.3)
CO2: 23 mmol/L (ref 20–31)
Chloride: 110 mmol/L (ref 98–110)
Creat: 1.59 mg/dL — ABNORMAL HIGH (ref 0.70–1.18)
GLUCOSE: 87 mg/dL (ref 65–99)
POTASSIUM: 4.5 mmol/L (ref 3.5–5.3)
Sodium: 141 mmol/L (ref 135–146)
TOTAL PROTEIN: 5.9 g/dL — AB (ref 6.1–8.1)
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
## 2016-04-16 ENCOUNTER — Other Ambulatory Visit: Payer: Self-pay | Admitting: Physician Assistant

## 2016-04-16 ENCOUNTER — Other Ambulatory Visit: Payer: Self-pay | Admitting: Allergy and Immunology

## 2016-04-16 NOTE — Telephone Encounter (Signed)
Rx(s) sent to pharmacy electronically.  

## 2016-04-23 ENCOUNTER — Other Ambulatory Visit: Payer: Self-pay | Admitting: Cardiovascular Disease

## 2016-04-29 ENCOUNTER — Telehealth: Payer: Self-pay | Admitting: *Deleted

## 2016-04-29 NOTE — Telephone Encounter (Signed)
-----   Message from Lennette Biharihomas A Kelly, MD sent at 04/28/2016 12:16 PM EST ----- Renal function is better

## 2016-04-29 NOTE — Telephone Encounter (Signed)
Left lab results on patient's home answering machine. 

## 2016-05-25 ENCOUNTER — Telehealth: Payer: Self-pay | Admitting: Cardiovascular Disease

## 2016-05-25 NOTE — Telephone Encounter (Signed)
[facility] 07/01/16 with Dr Tresa EndoKelly.  Records put with Dr Landry DykeKelly's schedule for 07/01/16.  Lp

## 2016-05-25 NOTE — Telephone Encounter (Signed)
Call goes directly to VM. Left msg to call.

## 2016-05-25 NOTE — Telephone Encounter (Signed)
Pt of Dr. Tresa EndoKelly  Patient put through to me by operator. Was able to speak w him regarding symptoms.  He notes a recurrence of chest pain episodes recently, which he attributes to occurring in cold weather. Notes these are isolated, has not required nitrate or other intervention for them.   He notes today he woke up around 4am and was having chest discomfort. States he took 1x NTG and this relieved discomfort completely. States pain was sharp, felt in top middle of chest. Did not have SOB, radiating arm, jaw, or neck pain. Denies dizziness.  He became concerned because this was slightly different than the chest discomfort he'd had in cold weather. Also notes different in character to his prior MI symptoms (which he described as more of a burning sensation).  Pt calling to seek guidance/appt w provider. Agreeable to being seen tomorrow by APP. He is a former EMT and expressed comfort in knowing when he should go to the ED. He did voice agreement to seek emergency evaluation should he have onset of pain unrelieved w NTG.  FYI

## 2016-05-25 NOTE — Progress Notes (Signed)
Cardiology Office Note    Date:  05/26/2016   ID:  Jorge Cherry, DOB 08/06/1935, MRN [id number]  PCP:  Hoyle Sauer, MD  Cardiologist: Dr. Tresa Endo  Chief Complaint  Patient presents with  . Follow-up    Kelly eval for CP    History of Present Illness:    Jorge Cherry is a 81 y.o. male with past medical history of CAD (s/p CABG in 1994, s/p PCI to SVG-D1 in 2003, low-risk NST in 01/2015), chronic diastolic CHF, PAF (on Eliquis), HTN, HLD, and mild AS (by echo in 11/2015) who presents to the office today for evaluation of chest pain.   He was last seen by Dr. Tresa Endo in 04/2016 and reported doing well from a cardiac perspective. At the time of his appointment, BP was noted to be elevated to 161/75. Hydralazine was increased from 25mg  TID to 37.5mg  TID and he was continued on Toprol-XL 50mg  daily, Imdur 60mg  daily, Spironolactone 12.5mg  daily, Telmisartan 80mg  daily, and Torsemide 10mg  daily.   In talking with the patient today, he reports having episodes of chest discomfort with the colder temperatures over the past month. He reports when it is cold outside, he develops a stinging sensation along his sternum. This can occur at rest or with exertion. He walks his dog multiple times per day and denies any associated chest discomfort or dyspnea with exertion with this activity.  He did awaken 2 nights ago with chest discomfort, which was relieved with NTG spray. Denies any associated symptoms of dyspnea with exertion, nausea, vomiting, or radiating pain. Did report feeling flushed at that time. No repeat episodes since.  His wife who is present today reports he has been under increased stress as her daughter is currently living with them due to an abusive relationship.   He has been checking his BP regularly at home and says readings are usually in the 140's/ 80-90's. He did not take his BP medications this morning due to having not yet consumed breakfast.   Past Medical History:    Diagnosis Date  . Aortic valve disorder    2D ECHO, 10/19/2011 - EF >55%, LA mild-moderately dilated, mild-moderate tricuspid regurgitation, mild-moderate valvular aortic stenosis, moderate calcification of aortic valve leaflets  . Atherosclerosis of renal artery (HCC)    RENAL DOPPLER, 12/10/2011 - Left renal artery demonstrated narrowing with elevated velocities consistent with a 1-59% diameter reduction  . Atrial fibrillation (HCC)   . CKD (chronic kidney disease) stage 3, GFR 30-59 ml/min   . Coronary artery disease    a. s/p CABG in 1994 b. s/p PCI with DES to SVG-D1 in 2003 c. low-risk NST in 01/2015  . Hypertension   . PAF (paroxysmal atrial fibrillation) (HCC)    a. on Eliquis    Past Surgical History:  Procedure Laterality Date  . CARDIAC CATHETERIZATION  02/27/2004   Coronary intervention and medical management  . CARDIAC CATHETERIZATION  03/04/2004   SVG supplying the diagonal vessel stented with a 3.5x48mm Taxus stent post dilated to 4.0 mm  . CATARACT EXTRACTION    . CORONARY ARTERY BYPASS GRAFT    . CORONARY STENT PLACEMENT    . INGUINAL HERNIA REPAIR Right   . TONSILLECTOMY AND ADENOIDECTOMY      Current Medications: Outpatient Medications Prior to Visit  Medication Sig Dispense Refill  . albuterol (PROAIR HFA) 108 (90 Base) MCG/ACT inhaler INHALE TWO PUFFS EVERY 4-6 HOURS AS NEEDED FOR COUGH OR WHEEZE 1 Inhaler 1  . apixaban (  ELIQUIS) 5 MG TABS tablet Take 1 tablet (5 mg total) by mouth 2 (two) times daily. 60 tablet 11  . atorvastatin (LIPITOR) 20 MG tablet Take 1 tablet by mouth daily.    . Azelastine-Fluticasone (DYMISTA) 137-50 MCG/ACT SUSP Use one to two sprays in each nostril once a day. 23 g 5  . beclomethasone (QVAR) 40 MCG/ACT inhaler Inhale 1 puff into the lungs daily.    . Cholecalciferol (VITAMIN D-3 PO) Take 1,000 Units by mouth daily.     [name] ezetimibe (ZETIA) 10 MG tablet TAKE 1 TABLET (10 MG TOTAL) BY MOUTH DAILY. 90 tablet 3  . fenofibrate (TRICOR)  145 MG tablet Take 72.5 mg by mouth daily.     . fluticasone (FLOVENT HFA) 110 MCG/ACT inhaler Use 3 puffs 3 times daily as directed for asthma flareup 12 g 5  . hydrALAZINE (APRESOLINE) 25 MG tablet Take 1.5 tablets (37.5 mg total) by mouth every 8 (eight) hours. 270 tablet 6  . ipratropium (ATROVENT) 0.06 % nasal spray Can use two sprays in each nostril every six hours as needed to dry up nose. 15 mL 5  . loratadine (CLARITIN) 10 MG tablet Take 10 mg by mouth daily.    . metoprolol succinate (TOPROL-XL) 50 MG 24 hr tablet TAKE 1 TABLET (50 MG TOTAL) BY MOUTH DAILY. TAKE WITH OR IMMEDIATELY FOLLOWING A MEAL. 30 tablet 10  . nitroGLYCERIN (NITROLINGUAL) 0.4 MG/SPRAY spray Place 1 spray under the tongue every 5 (five) minutes x 3 doses as needed for chest pain. 12 g 0  . pantoprazole (PROTONIX) 40 MG tablet Take 1 tablet (40 mg total) by mouth 2 (two) times daily. 60 tablet 5  . pantoprazole (PROTONIX) 40 MG tablet TAKE 1 TABLET (40 MG TOTAL) BY MOUTH 2 (TWO) TIMES DAILY. 60 tablet 4  . spironolactone (ALDACTONE) 25 MG tablet TAKE 1/2 TABLET (12.5 MG TOTAL) BY MOUTH DAILY. 45 tablet 2  . telmisartan (MICARDIS) 80 MG tablet Take 80 mg by mouth daily.    [name] torsemide (DEMADEX) 10 MG tablet Take 1 tablet (10 mg total) by mouth daily. (Patient taking differently: Take 5 mg by mouth daily. ) 30 tablet 6  . vitamin C (ASCORBIC ACID) 500 MG tablet Take 500 mg by mouth daily.    . vitamin E 400 UNIT capsule Take 400 Units by mouth daily.    [name] zinc gluconate 50 MG tablet Take 50 mg by mouth 2 (two) times a week.    . isosorbide mononitrate (IMDUR) 60 MG 24 hr tablet TAKE 1 TABLET (60 MG TOTAL) BY MOUTH DAILY. 30 tablet 10   No facility-administered medications prior to visit.      Allergies:   Altace [ramipril]; Mucinex [guaifenesin er]; and Contrast media [iodinated diagnostic agents]   Social History   Social History  . Marital status: Married    Spouse name: N/A  . Number of children: 2  . Years  of education: N/A   Social History Main Topics  . Smoking status: Never Smoker  . Smokeless tobacco: Never Used  . Alcohol use No  . Drug use: No  . Sexual activity: Not Asked   Other Topics Concern  . None   Social History Narrative   Lives at home with wife.  Retired from [employer].       Family History:  The patient's family history includes Cancer in his maternal grandfather and mother; Heart disease in his father; Stroke in his maternal grandmother.   Review of  Systems:   Please see the history of present illness.     General:  No chills, fever, night sweats or weight changes.  Cardiovascular:  No dyspnea on exertion, edema, orthopnea, palpitations, paroxysmal nocturnal dyspnea. Positive for chest pain.  Dermatological: No rash, lesions/masses Respiratory: No cough, dyspnea Urologic: No hematuria, dysuria Abdominal:   No nausea, vomiting, diarrhea, bright red blood per rectum, melena, or hematemesis Neurologic:  No visual changes, wkns, changes in mental status. All other systems reviewed and are otherwise negative except as noted above.   Physical Exam:    VS:  BP (!) 176/88   Pulse 61   Ht 5\' 8"  (1.727 m)   Wt 210 lb (95.3 kg)   BMI 31.93 kg/m    General: Well developed, well nourished Caucasian male appearing in no acute distress. Head: Normocephalic, atraumatic, sclera non-icteric, no xanthomas, nares are without discharge.  Neck: No carotid bruits. JVD not elevated.  Lungs: Respirations regular and unlabored, without wheezes or rales.  Heart: Regular rate and rhythm. No S3 or S4.  No rubs, or gallops appreciated. 2/6 SEM best appreciated at RUSB. Abdomen: Soft, non-tender, non-distended with normoactive bowel sounds. No hepatomegaly. No rebound/guarding. No obvious abdominal masses. Msk:  Strength and tone appear normal for age. No joint deformities or effusions. Extremities: No clubbing or cyanosis. No edema.  Distal pedal pulses are 2+  bilaterally. Neuro: Alert and oriented X 3. Moves all extremities spontaneously. No focal deficits noted. Psych:  Responds to questions appropriately with a normal affect. Skin: No rashes or lesions noted  Wt Readings from Last 3 Encounters:  05/26/16 210 lb (95.3 kg)  04/14/16 206 lb 12.8 oz (93.8 kg)  01/10/16 205 lb (93 kg)     Studies/Labs Reviewed:   EKG:  EKG is ordered today.  The ekg ordered today demonstrates NSR, HR 61, with occasional PAC's. No acute ST or T-wave changes.   Recent Labs: 11/20/2015: B Natriuretic Peptide 605.3; TSH 2.649 11/23/2015: Magnesium 2.0 12/16/2015: Hemoglobin 12.9; Platelets 223 04/14/2016: ALT 16; BUN 22; Creat 1.59; Potassium 4.5; Sodium 141   Lipid Panel    Component Value Date/Time   CHOL 92 11/21/2015 0145   CHOL 137 04/09/2014 1115   TRIG 189 (H) 11/21/2015 0145   TRIG 182 (H) 04/09/2014 1115   HDL 28 (L) 11/21/2015 0145   HDL 32 (L) 04/09/2014 1115   CHOLHDL 3.3 11/21/2015 0145   VLDL 38 11/21/2015 0145   LDLCALC 26 11/21/2015 0145   LDLCALC 69 04/09/2014 1115    Additional studies/ records that were reviewed today include:   NST: 01/2015  There was no ST segment deviation noted during stress.  This is a low risk study.  Findings consistent with ischemia.  The left ventricular ejection fraction is mildly decreased (45-54%).  Nuclear stress EF: 54%.   Low risk stress nuclear study with a small, medium intensity, reversible defect in the distal inferior lateral wall and apex; findings consistent with mild ischemia; EF 54; mild LVE.   Echocardiogram: 11/2015 Study Conclusions  - Left ventricle: The cavity size was normal. There was mild focal   basal hypertrophy of the septum. Systolic function was normal.   The estimated ejection fraction was in the range of 55% to 60%.   Wall motion was normal; there were no regional wall motion   abnormalities. Left ventricular diastolic function parameters   were normal. - Aortic  valve: Trileaflet; moderately thickened, moderately   calcified leaflets. Valve mobility was restricted. There was mild  stenosis. Peak velocity (S): 287 cm/s. Valve area (VTI): 1.77   cm^2. Valve area (Vmax): 1.75 cm^2. Valve area (Vmean): 1.5 cm^2. - Mitral valve: There was mild regurgitation. - Left atrium: The atrium was moderately dilated. - Pulmonary arteries: Systolic pressure was mildly increased. PA   peak pressure: 43 mm Hg (S).  Impressions:  - Compared to the prior study, there has been no significant   interval change.  Assessment:    1. Atherosclerosis of native coronary artery of native heart with stable angina pectoris (HCC)   2. Essential hypertension   3. Aortic valve stenosis, etiology of cardiac valve disease unspecified   4. Chronic diastolic heart failure (HCC)   5. Paroxysmal atrial fibrillation (HCC)      Plan:   In order of problems listed above:  1. CAD with stable angina - s/p CABG in 1994 with PCI to SVG-D1 in 2003. - his last ischemic evaluation was a low-risk NST in 01/2015. Echocardiogram in 11/2015 showed a preserved EF of 55-60% with no WMA.  - he presents today reporting episodes of stinging chest discomfort along his sternal scar which presents with cold temperatures. No exertional component noted and no dyspnea with exertion. EKG today is without acute ischemic changes. - with his NST just 1.5 years ago and atypical symptoms, would not pursue further ischemic evaluation at this time. Will titrate medical therapy with increasing Imdur from 60mg  daily to 90mg  daily.  - continue statin and BB. No ASA with concurrent use of anticoagulation. I informed the patient to notify us if his symptoms continue as we could consider a repeat NST. Would hold off on a cardiac catheterization unless high-risk with his Stage 3 CKD.  2. Essential HTN - BP elevated to 176/88 on initial check, at 158/86 when rechecked. - he reports not taking any of his BP  medications this morning. Will continue current medication regimen with changes to Imdur as above. Was encouraged to check his BP regularly at home and if it remains elevated, to let us know, as we could further titrate his Hydralazine to 50mg  TID.   3. Aortic Stenosis - mild by echo in 11/2015. Peak velocity (S): 287 cm/s. Valve area (VTI): 1.77 cm^2.   4. Chronic Diastolic CHF - preserved EF of 55-60% by recent echo. - does not appear volume overloaded on physical exam.  5. PAF - in NSR on examination today. - This patients CHA2DS2-VASc Score and unadjusted Ischemic Stroke Rate (% per year) is equal to 4.8 % stroke rate/year from a score of 4 (HTN, Vascular, Age (2)). Denies any evidence of active bleeding. Continue Eliquis 5mg  BID.  - continue BB for rate-control.   Medication Adjustments/Labs and Tests Ordered: Current medicines are reviewed at length with the patient today.  Concerns regarding medicines are outlined above.  Medication changes, Labs and Tests ordered today are listed in the Patient Instructions below.  Patient Instructions  Randall An, [place] has recommended making the following medication changes: 1. INCREASE Isosorbide to 90 mg daily  Your physician recommends that you schedule a follow-up appointment in 6 months with Dr Tresa Endo. You will receive a reminder letter in the mail two months in advance. If you don't receive a letter, please call our office to schedule the follow-up appointment.  If you need a refill on your cardiac medications before your next appointment, please call your pharmacy.    Signed, Ellsworth Lennox, PA  05/26/2016 12:41 PM     [facility]  [address] [place], [place]  [address] Phone: [phone]; Fax: [phone]  [address] [place], [place] [address] Phone: [phone]

## 2016-05-25 NOTE — Telephone Encounter (Signed)
New message      Pt c/o of Chest Pain: STAT if CP now or developed within 24 hours  1. Are you having CP right now?  no  2. Are you experiencing any other symptoms (ex. SOB, nausea, vomiting, sweating)?  no 3. How long have you been experiencing CP?  Started at 4am this morning  4. Is your CP continuous or coming and going?  continuous 5. Have you taken Nitroglycerin?  yes?

## 2016-05-26 ENCOUNTER — Encounter: Payer: Self-pay | Admitting: Student

## 2016-05-26 ENCOUNTER — Ambulatory Visit (INDEPENDENT_AMBULATORY_CARE_PROVIDER_SITE_OTHER): Payer: Medicare Other | Admitting: Student

## 2016-05-26 VITALS — BP 176/88 | HR 61 | Ht 68.0 in | Wt 210.0 lb

## 2016-05-26 DIAGNOSIS — I10 Essential (primary) hypertension: Secondary | ICD-10-CM

## 2016-05-26 DIAGNOSIS — I350 Nonrheumatic aortic (valve) stenosis: Secondary | ICD-10-CM | POA: Diagnosis not present

## 2016-05-26 DIAGNOSIS — I5032 Chronic diastolic (congestive) heart failure: Secondary | ICD-10-CM | POA: Insufficient documentation

## 2016-05-26 DIAGNOSIS — I25118 Atherosclerotic heart disease of native coronary artery with other forms of angina pectoris: Secondary | ICD-10-CM | POA: Diagnosis not present

## 2016-05-26 DIAGNOSIS — I480 Paroxysmal atrial fibrillation: Secondary | ICD-10-CM | POA: Diagnosis not present

## 2016-05-26 MED ORDER — ISOSORBIDE MONONITRATE ER 60 MG PO TB24: 90.0000 mg | ORAL_TABLET | Freq: Every day | ORAL | 11 refills | Status: DC

## 2016-05-26 NOTE — Patient Instructions (Signed)
Randall AnBrittany Strader, [place] has recommended making the following medication changes: 1. INCREASE Isosorbide to 90 mg daily  Your physician recommends that you schedule a follow-up appointment in 6 months with Dr Tresa EndoKelly. You will receive a reminder letter in the mail two months in advance. If you don't receive a letter, please call our office to schedule the follow-up appointment.  If you need a refill on your cardiac medications before your next appointment, please call your pharmacy.

## 2016-07-01 ENCOUNTER — Encounter: Payer: Self-pay | Admitting: Cardiovascular Disease

## 2016-07-01 ENCOUNTER — Ambulatory Visit (INDEPENDENT_AMBULATORY_CARE_PROVIDER_SITE_OTHER): Payer: Medicare Other | Admitting: Cardiovascular Disease

## 2016-07-01 VITALS — BP 168/83 | HR 60 | Ht 68.0 in | Wt 206.8 lb

## 2016-07-01 DIAGNOSIS — E785 Hyperlipidemia, unspecified: Secondary | ICD-10-CM | POA: Diagnosis not present

## 2016-07-01 DIAGNOSIS — Z951 Presence of aortocoronary bypass graft: Secondary | ICD-10-CM

## 2016-07-01 DIAGNOSIS — I350 Nonrheumatic aortic (valve) stenosis: Secondary | ICD-10-CM

## 2016-07-01 DIAGNOSIS — Z7901 Long term (current) use of anticoagulants: Secondary | ICD-10-CM

## 2016-07-01 DIAGNOSIS — I480 Paroxysmal atrial fibrillation: Secondary | ICD-10-CM | POA: Diagnosis not present

## 2016-07-01 DIAGNOSIS — I10 Essential (primary) hypertension: Secondary | ICD-10-CM | POA: Diagnosis not present

## 2016-07-01 DIAGNOSIS — Z79899 Other long term (current) drug therapy: Secondary | ICD-10-CM

## 2016-07-01 DIAGNOSIS — N183 Chronic kidney disease, stage 3 unspecified: Secondary | ICD-10-CM

## 2016-07-01 DIAGNOSIS — I25118 Atherosclerotic heart disease of native coronary artery with other forms of angina pectoris: Secondary | ICD-10-CM

## 2016-07-01 MED ORDER — ISOSORBIDE MONONITRATE ER 60 MG PO TB24: ORAL_TABLET | ORAL | 11 refills | Status: DC

## 2016-07-01 NOTE — Patient Instructions (Signed)
Medication Instructions:  THE ISOSORBIDE HAS BEEN INCREASED. CONTINUE THE MORNING DOSE AS NORMAL TAKE ADDITIONAL 1/2 TABLET IN THE PM   Labwork:  Tomorrow fasting.  Testing/Procedures: Your physician has requested that you have an echocardiogram. Echocardiography is a painless test that uses sound waves to create images of your heart. It provides your doctor with information about the size and shape of your heart and how well your heart's chambers and valves are working. This procedure takes approximately one hour. There are no restrictions for this procedure.  This will be scheduled for nest week.   Follow-Up:  2-3 weeks with Dr Jorge Cherry  Any Other Special Instructions Will Be Listed Below (If Applicable).
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
## 2016-07-02 LAB — CBC
HCT: 40.4 % (ref 38.5–50.0)
Hemoglobin: 13.1 g/dL — ABNORMAL LOW (ref 13.2–17.1)
MCH: 29.6 pg (ref 27.0–33.0)
MCHC: 32.4 g/dL (ref 32.0–36.0)
MCV: 91.4 fL (ref 80.0–100.0)
MPV: 11.4 fL (ref 7.5–12.5)
PLATELETS: 192 10*3/uL (ref 140–400)
RBC: 4.42 MIL/uL (ref 4.20–5.80)
RDW: 13.8 % (ref 11.0–15.0)
WBC: 6.3 10*3/uL (ref 3.8–10.8)

## 2016-07-02 LAB — TSH: TSH: 2.32 m[IU]/L (ref 0.40–4.50)

## 2016-07-03 LAB — LIPID PANEL
CHOL/HDL RATIO: 3 ratio (ref <5.0)
Cholesterol: 106 mg/dL (ref <200)
HDL: 35 mg/dL — ABNORMAL LOW (ref >40)
LDL CALC: 47 mg/dL (ref <100)
Triglycerides: 120 mg/dL (ref <150)
VLDL: 24 mg/dL (ref <30)

## 2016-07-03 LAB — COMPREHENSIVE METABOLIC PANEL
ALT: 17 U/L (ref 9–46)
AST: 20 U/L (ref 10–35)
Albumin: 4 g/dL (ref 3.6–5.1)
Alkaline Phosphatase: 38 U/L — ABNORMAL LOW (ref 40–115)
BUN: 27 mg/dL — AB (ref 7–25)
CHLORIDE: 112 mmol/L — AB (ref 98–110)
CO2: 21 mmol/L (ref 20–31)
Calcium: 9.4 mg/dL (ref 8.6–10.3)
Creat: 1.65 mg/dL — ABNORMAL HIGH (ref 0.70–1.18)
GLUCOSE: 86 mg/dL (ref 65–99)
POTASSIUM: 4.4 mmol/L (ref 3.5–5.3)
SODIUM: 143 mmol/L (ref 135–146)
Total Bilirubin: 0.3 mg/dL (ref 0.2–1.2)
Total Protein: 6.4 g/dL (ref 6.1–8.1)

## 2016-07-17 ENCOUNTER — Other Ambulatory Visit: Payer: Self-pay

## 2016-07-17 ENCOUNTER — Ambulatory Visit (HOSPITAL_COMMUNITY): Payer: Medicare Other | Attending: Internal Medicine

## 2016-07-17 DIAGNOSIS — I350 Nonrheumatic aortic (valve) stenosis: Secondary | ICD-10-CM | POA: Diagnosis not present

## 2016-07-17 DIAGNOSIS — I10 Essential (primary) hypertension: Secondary | ICD-10-CM | POA: Diagnosis not present

## 2016-07-17 DIAGNOSIS — Z951 Presence of aortocoronary bypass graft: Secondary | ICD-10-CM | POA: Diagnosis not present

## 2016-07-17 DIAGNOSIS — I25118 Atherosclerotic heart disease of native coronary artery with other forms of angina pectoris: Secondary | ICD-10-CM | POA: Diagnosis present

## 2016-07-17 DIAGNOSIS — I080 Rheumatic disorders of both mitral and aortic valves: Secondary | ICD-10-CM | POA: Diagnosis not present

## 2016-07-17 DIAGNOSIS — I4891 Unspecified atrial fibrillation: Secondary | ICD-10-CM | POA: Insufficient documentation

## 2016-07-17 DIAGNOSIS — E785 Hyperlipidemia, unspecified: Secondary | ICD-10-CM | POA: Diagnosis not present

## 2016-07-21 ENCOUNTER — Ambulatory Visit (INDEPENDENT_AMBULATORY_CARE_PROVIDER_SITE_OTHER): Payer: Medicare Other | Admitting: Cardiovascular Disease

## 2016-07-21 ENCOUNTER — Encounter: Payer: Self-pay | Admitting: Cardiovascular Disease

## 2016-07-21 VITALS — BP 130/74 | HR 71 | Ht 69.0 in | Wt 205.2 lb

## 2016-07-21 DIAGNOSIS — N183 Chronic kidney disease, stage 3 unspecified: Secondary | ICD-10-CM

## 2016-07-21 DIAGNOSIS — I480 Paroxysmal atrial fibrillation: Secondary | ICD-10-CM | POA: Diagnosis not present

## 2016-07-21 DIAGNOSIS — I5032 Chronic diastolic (congestive) heart failure: Secondary | ICD-10-CM | POA: Diagnosis not present

## 2016-07-21 DIAGNOSIS — Z7901 Long term (current) use of anticoagulants: Secondary | ICD-10-CM

## 2016-07-21 DIAGNOSIS — I2510 Atherosclerotic heart disease of native coronary artery without angina pectoris: Secondary | ICD-10-CM | POA: Diagnosis not present

## 2016-07-21 DIAGNOSIS — I4891 Unspecified atrial fibrillation: Secondary | ICD-10-CM | POA: Diagnosis not present

## 2016-07-21 DIAGNOSIS — I350 Nonrheumatic aortic (valve) stenosis: Secondary | ICD-10-CM | POA: Diagnosis not present

## 2016-07-21 DIAGNOSIS — Z79899 Other long term (current) drug therapy: Secondary | ICD-10-CM | POA: Diagnosis not present

## 2016-07-21 DIAGNOSIS — Z951 Presence of aortocoronary bypass graft: Secondary | ICD-10-CM | POA: Diagnosis not present

## 2016-07-21 DIAGNOSIS — E785 Hyperlipidemia, unspecified: Secondary | ICD-10-CM

## 2016-07-21 MED ORDER — METOPROLOL SUCCINATE ER 25 MG PO TB24: ORAL_TABLET | ORAL | 3 refills | Status: DC

## 2016-07-21 NOTE — Patient Instructions (Signed)
Medication Instructions:   START METOPROLOL SUCC ER 25 MG ONE TABLET DAILY AT BEDTIME  Follow-Up:  Your physician recommends that you schedule a follow-up appointment in: ONE MONTH WITH DR Tresa EndoKELLY   IF BLOOD PRESSURE IS BELOW 100- DO NOT TAKE THE EVENING DOSE OF ISOSORBIDE
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
## 2016-07-22 ENCOUNTER — Telehealth: Payer: Self-pay | Admitting: Cardiovascular Disease

## 2016-07-22 NOTE — Telephone Encounter (Signed)
New Message     Tell Burna MortimerWanda know that 07/21/16 [facility] tested [place] for the Flu, he tested Positive , she just wanted to let you know that you all were exposed to the flu virus

## 2016-07-22 NOTE — Telephone Encounter (Signed)
Acknowledged-routed to staff to make aware.

## 2016-07-23 ENCOUNTER — Other Ambulatory Visit: Payer: Self-pay | Admitting: Allergy and Immunology

## 2016-07-23 ENCOUNTER — Telehealth: Payer: Self-pay | Admitting: Cardiovascular Disease

## 2016-07-23 MED ORDER — EZETIMIBE 10 MG PO TABS: ORAL_TABLET | ORAL | 3 refills | Status: DC

## 2016-07-23 NOTE — Telephone Encounter (Signed)
Spoke with pt wife, aware refill sent to the pharmacy 

## 2016-07-23 NOTE — Telephone Encounter (Signed)
Mrs. Jorge Cherry is calling to find out if [name] has ever been on Zeita and If so he will need a prescription sent to his pharmacy . Please  Call if you have any questions

## 2016-07-28 ENCOUNTER — Telehealth: Payer: Self-pay | Admitting: Allergy and Immunology

## 2016-07-28 NOTE — Telephone Encounter (Signed)
Pt wife called and said that the [facility] in [place] has been telling them that the Qvar in on back order and they need something for him now. [phone]

## 2016-07-28 NOTE — Telephone Encounter (Signed)
I spoke with wife and advised on change to Flovent.

## 2016-08-05 ENCOUNTER — Encounter (HOSPITAL_COMMUNITY): Payer: Self-pay | Admitting: Emergency Medicine

## 2016-08-05 ENCOUNTER — Other Ambulatory Visit: Payer: Self-pay

## 2016-08-05 DIAGNOSIS — Z7901 Long term (current) use of anticoagulants: Secondary | ICD-10-CM | POA: Insufficient documentation

## 2016-08-05 DIAGNOSIS — I481 Persistent atrial fibrillation: Secondary | ICD-10-CM | POA: Insufficient documentation

## 2016-08-05 DIAGNOSIS — I129 Hypertensive chronic kidney disease with stage 1 through stage 4 chronic kidney disease, or unspecified chronic kidney disease: Secondary | ICD-10-CM | POA: Diagnosis not present

## 2016-08-05 DIAGNOSIS — Z79899 Other long term (current) drug therapy: Secondary | ICD-10-CM | POA: Diagnosis not present

## 2016-08-05 DIAGNOSIS — I25810 Atherosclerosis of coronary artery bypass graft(s) without angina pectoris: Secondary | ICD-10-CM | POA: Insufficient documentation

## 2016-08-05 DIAGNOSIS — R42 Dizziness and giddiness: Secondary | ICD-10-CM | POA: Diagnosis present

## 2016-08-05 DIAGNOSIS — Z951 Presence of aortocoronary bypass graft: Secondary | ICD-10-CM | POA: Diagnosis not present

## 2016-08-05 DIAGNOSIS — N183 Chronic kidney disease, stage 3 (moderate): Secondary | ICD-10-CM | POA: Diagnosis not present

## 2016-08-05 LAB — BASIC METABOLIC PANEL
ANION GAP: 11 (ref 5–15)
BUN: 32 mg/dL — ABNORMAL HIGH (ref 6–20)
CALCIUM: 8.9 mg/dL (ref 8.9–10.3)
CO2: 19 mmol/L — ABNORMAL LOW (ref 22–32)
Chloride: 110 mmol/L (ref 101–111)
Creatinine, Ser: 1.76 mg/dL — ABNORMAL HIGH (ref 0.61–1.24)
GFR calc Af Amer: 41 mL/min — ABNORMAL LOW (ref >60)
GFR, EST NON AFRICAN AMERICAN: 35 mL/min — AB (ref >60)
GLUCOSE: 106 mg/dL — AB (ref 65–99)
POTASSIUM: 4 mmol/L (ref 3.5–5.1)
SODIUM: 140 mmol/L (ref 135–145)

## 2016-08-05 LAB — CBC WITH DIFFERENTIAL/PLATELET
BASOS ABS: 0 10*3/uL (ref 0.0–0.1)
Basophils Relative: 0 %
EOS ABS: 0 10*3/uL (ref 0.0–0.7)
Eosinophils Relative: 0 %
HCT: 37.1 % — ABNORMAL LOW (ref 39.0–52.0)
Hemoglobin: 12 g/dL — ABNORMAL LOW (ref 13.0–17.0)
Lymphocytes Relative: 6 %
Lymphs Abs: 0.9 10*3/uL (ref 0.7–4.0)
MCH: 29.6 pg (ref 26.0–34.0)
MCHC: 32.3 g/dL (ref 30.0–36.0)
MCV: 91.6 fL (ref 78.0–100.0)
MONO ABS: 1.2 10*3/uL — AB (ref 0.1–1.0)
Monocytes Relative: 8 %
NEUTROS PCT: 86 %
Neutro Abs: 13.3 10*3/uL — ABNORMAL HIGH (ref 1.7–7.7)
PLATELETS: 237 10*3/uL (ref 150–400)
RBC: 4.05 MIL/uL — AB (ref 4.22–5.81)
RDW: 13.4 % (ref 11.5–15.5)
WBC: 15.4 10*3/uL — AB (ref 4.0–10.5)

## 2016-08-05 LAB — APTT: APTT: 35 s (ref 24–36)

## 2016-08-05 NOTE — ED Triage Notes (Signed)
Patient with new dx of a-fib, 07/21/16. Became dizzy around noon today. States he is "just a little dizzy" and drove self to ED tonight

## 2016-08-06 ENCOUNTER — Emergency Department (HOSPITAL_COMMUNITY): Payer: Medicare Other

## 2016-08-06 ENCOUNTER — Emergency Department (HOSPITAL_COMMUNITY)
Admission: EM | Admit: 2016-08-06 | Discharge: 2016-08-06 | Disposition: A | Discharge status: Home / Self Care | Payer: Medicare Other | Attending: Emergency Medicine | Admitting: Emergency Medicine

## 2016-08-06 DIAGNOSIS — N289 Disorder of kidney and ureter, unspecified: Secondary | ICD-10-CM

## 2016-08-06 DIAGNOSIS — I4819 Other persistent atrial fibrillation: Secondary | ICD-10-CM

## 2016-08-06 DIAGNOSIS — R42 Dizziness and giddiness: Secondary | ICD-10-CM

## 2016-08-06 LAB — HEPATIC FUNCTION PANEL
ALBUMIN: 3.2 g/dL — AB (ref 3.5–5.0)
ALK PHOS: 29 U/L — AB (ref 38–126)
ALT: 18 U/L (ref 17–63)
AST: 20 U/L (ref 15–41)
BILIRUBIN TOTAL: 0.7 mg/dL (ref 0.3–1.2)
Bilirubin, Direct: 0.2 mg/dL (ref 0.1–0.5)
Indirect Bilirubin: 0.5 mg/dL (ref 0.3–0.9)
TOTAL PROTEIN: 6 g/dL — AB (ref 6.5–8.1)

## 2016-08-06 LAB — URINALYSIS, ROUTINE W REFLEX MICROSCOPIC
Bilirubin Urine: NEGATIVE
Glucose, UA: NEGATIVE mg/dL
Hgb urine dipstick: NEGATIVE
Ketones, ur: NEGATIVE mg/dL
Leukocytes, UA: NEGATIVE
NITRITE: NEGATIVE
PH: 5 (ref 5.0–8.0)
Protein, ur: NEGATIVE mg/dL
SPECIFIC GRAVITY, URINE: 1.023 (ref 1.005–1.030)

## 2016-08-06 LAB — LIPASE, BLOOD: Lipase: 21 U/L (ref 11–51)

## 2016-08-06 LAB — TROPONIN I: Troponin I: <0.03 ng/mL (ref <0.03)

## 2016-08-06 IMAGING — CR DG CHEST 2V
2 series · 2 of 2 positions shown · non-contrast
Comparison: Chest radiograph performed [DATE]

CLINICAL DATA: Acute onset of cough and congestion. Generalized
weakness. Initial encounter.

EXAM:
CHEST  2 VIEW

[chest pa]
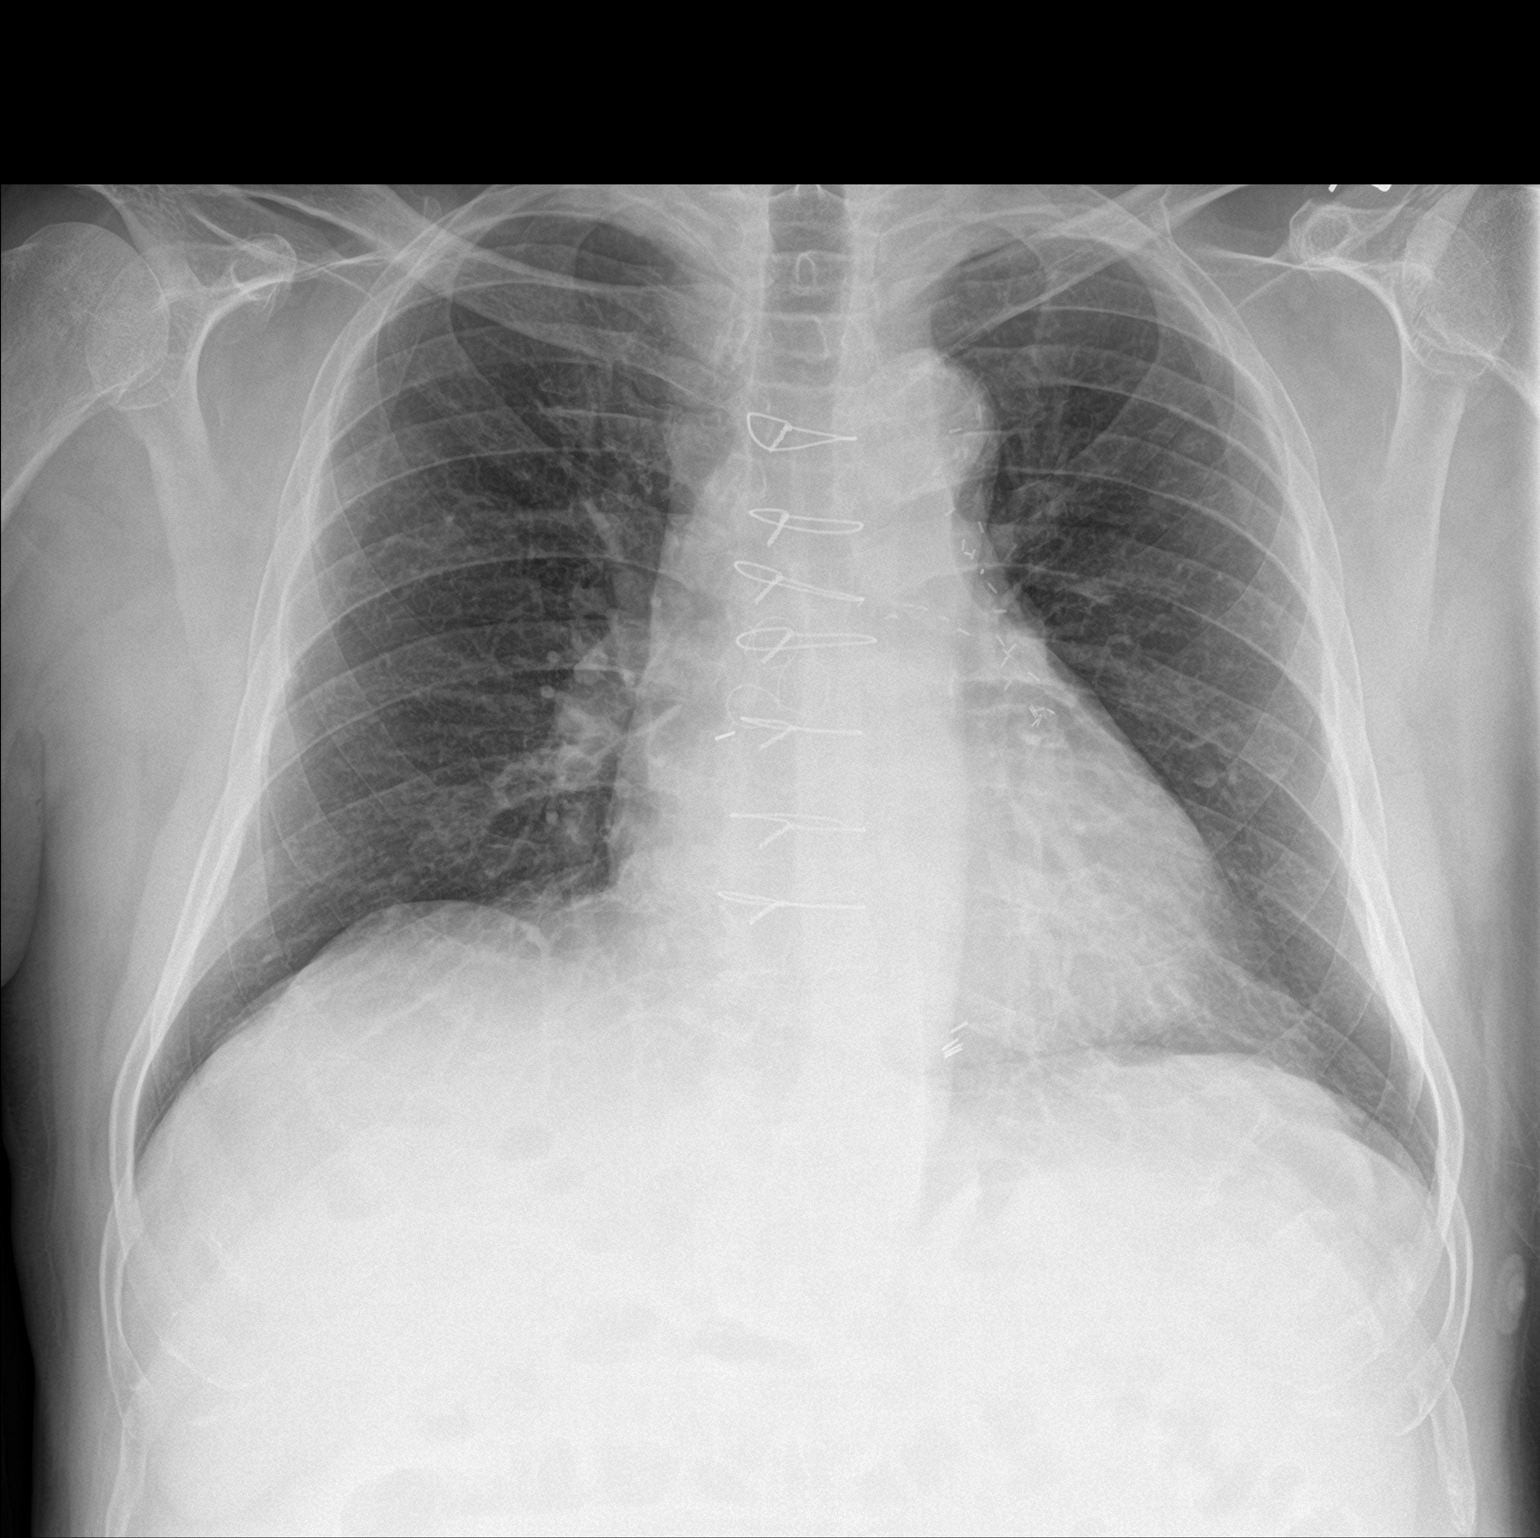

[chest lat]
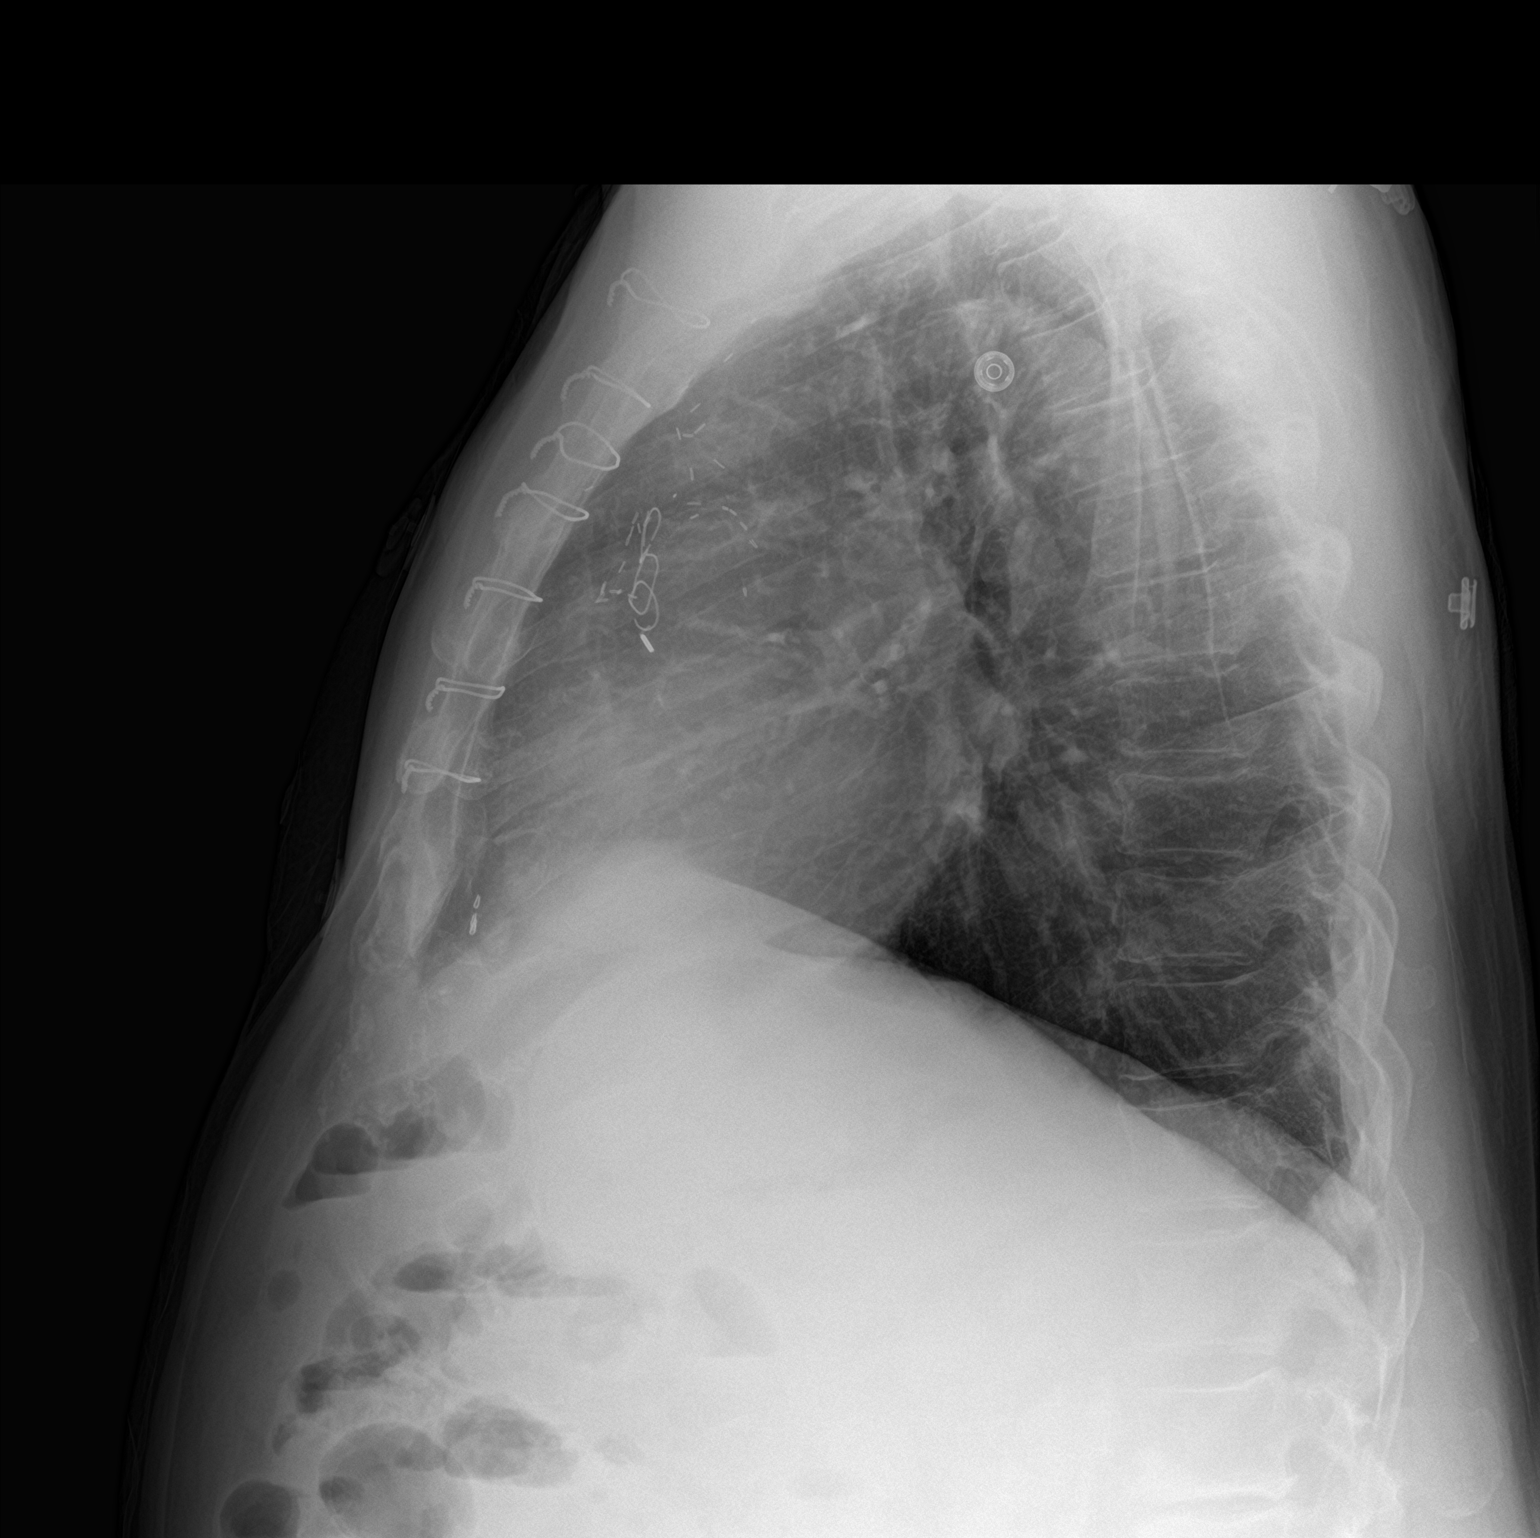

[2 of 2 positions shown; findings below may reference images not displayed]

FINDINGS: The lungs are well-aerated and clear. There is no evidence of focal
opacification, pleural effusion or pneumothorax.

The heart is borderline normal in size. The patient is status post
median sternotomy. No acute osseous abnormalities are seen.
IMPRESSION: No acute cardiopulmonary process seen.

## 2016-08-06 NOTE — ED Notes (Signed)
Patient transported to X-ray 

## 2016-08-06 NOTE — ED Provider Notes (Signed)
[facility]-EMERGENCY DEPT Provider Note   CSN: [id number] Arrival date & time: 08/05/16  2054  By signing my name below, I, Jorge Cherry, attest that this documentation has been prepared under the direction and in the presence of Jorge Rhine, MD. Electronically Signed: Elder Cherry, Scribe. 08/06/16. 1:53 AM.   History   Chief Complaint Chief Complaint  Patient presents with  . Dizziness    HPI Jorge Cherry is a 81 y.o. male with history of CAD with CABG on Eliquis, HTN, and recent diagnosis of A-fib on 3/13 who presents to the ED for evaluation of lightheadedness. This patient states that in the last 12-15 hours he has experienced lightheadedness which is primarily noticable when standing and exerting himself. At interview while at rest, he reports only "slight" symptoms. No chest pain. Mild dyspnea. No syncope or focal weaknesses. When asked, he is also reporting some subjective fevers today as well as abdominal tenderness; no pain currently. Of note, the patient is scheduled to followup with cardiology in 3 weeks, at which time he will be considered for cardioversion if he remains in atrial fibrillation according to records. He is taking Eliquis as prescribed.   The history is provided by the patient. No language interpreter was used.    Past Medical History:  Diagnosis Date  . Aortic valve disorder    2D ECHO, 10/19/2011 - EF >55%, LA mild-moderately dilated, mild-moderate tricuspid regurgitation, mild-moderate valvular aortic stenosis, moderate calcification of aortic valve leaflets  . Atherosclerosis of renal artery (HCC)    RENAL DOPPLER, 12/10/2011 - Left renal artery demonstrated narrowing with elevated velocities consistent with a 1-59% diameter reduction  . Atrial fibrillation (HCC)   . CKD (chronic kidney disease) stage 3, GFR 30-59 ml/min   . Coronary artery disease    a. s/p CABG in 1994 b. s/p PCI with DES to SVG-D1 in 2003 c. low-risk NST in 01/2015  . Hypertension    . PAF (paroxysmal atrial fibrillation) (HCC)    a. on Eliquis    Patient Active Problem List   Diagnosis Date Noted  . Chronic diastolic heart failure (HCC) 05/26/2016  . Junctional (nodal) bradycardia 11/20/2015  . Atrial fibrillation (HCC) 11/20/2015  . Hyperlipidemia LDL goal <70 05/03/2015  . Mild persistent asthma 03/19/2015  . Allergic rhinitis 03/19/2015  . Gastroesophageal reflux disease 03/19/2015  . Essential hypertension 02/07/2015  . Chest pain 01/22/2015  . Pain in the chest   . Chronic renal insufficiency, stage III (moderate) 12/28/2014  . Lower GI bleed 09/24/2014  . Diverticulosis of colon with hemorrhage 09/24/2014  . Symptomatic anemia 09/24/2014  . Aortic valve stenosis 07/22/2013  . Hyperlipidemia with target LDL less than 70 10/27/2012  . Edema 10/27/2012  . Hearing decreased 10/27/2012  . Hx of CABG 10/27/2012  . Coronary atherosclerosis of native coronary artery 10/27/2012  . Benign hypertensive heart disease without heart failure 10/27/2012    Past Surgical History:  Procedure Laterality Date  . CARDIAC CATHETERIZATION  02/27/2004   Coronary intervention and medical management  . CARDIAC CATHETERIZATION  03/04/2004   SVG supplying the diagonal vessel stented with a 3.5x71mm Taxus stent post dilated to 4.0 mm  . CATARACT EXTRACTION    . CORONARY ARTERY BYPASS GRAFT    . CORONARY STENT PLACEMENT    . INGUINAL HERNIA REPAIR Right   . TONSILLECTOMY AND ADENOIDECTOMY         Home Medications    Prior to Admission medications   Medication Sig Start Date End Date  Taking? Authorizing Provider  albuterol (PROAIR HFA) 108 (90 Base) MCG/ACT inhaler INHALE TWO PUFFS EVERY 4-6 HOURS AS NEEDED FOR COUGH OR WHEEZE 03/24/16   Jessica Priest, MD  apixaban (ELIQUIS) 5 MG TABS tablet Take 1 tablet (5 mg total) by mouth 2 (two) times daily. 11/23/15   Bhavinkumar Bhagat, PA  atorvastatin (LIPITOR) 20 MG tablet Take 1 tablet by mouth daily. 12/23/15   Historical  Provider, MD  Azelastine-Fluticasone (DYMISTA) 137-50 MCG/ACT SUSP Use one to two sprays in each nostril once a day. 03/24/16   Jessica Priest, MD  Cholecalciferol (VITAMIN D-3 PO) Take 1,000 Units by mouth daily.     Historical Provider, MD  ezetimibe (ZETIA) 10 MG tablet TAKE 1 TABLET (10 MG TOTAL) BY MOUTH DAILY. 07/23/16   Lennette Bihari, MD  fenofibrate (TRICOR) 145 MG tablet Take 72.5 mg by mouth daily.  12/10/15   Historical Provider, MD  fluticasone (FLOVENT HFA) 110 MCG/ACT inhaler Use 3 puffs 3 times daily as directed for asthma flareup 09/18/15   Jessica Priest, MD  hydrALAZINE (APRESOLINE) 25 MG tablet Take 1.5 tablets (37.5 mg total) by mouth every 8 (eight) hours. 04/14/16   Lennette Bihari, MD  ipratropium (ATROVENT) 0.06 % nasal spray Can use two sprays in each nostril every six hours as needed to dry up nose. 03/24/16   Jessica Priest, MD  isosorbide mononitrate (IMDUR) 60 MG 24 hr tablet Take 1.5 tablets in the AM and 1/2 tablet in the PM 07/01/16   Lennette Bihari, MD  loratadine (CLARITIN) 10 MG tablet Take 10 mg by mouth daily.    Historical Provider, MD  metoprolol succinate (TOPROL XL) 25 MG 24 hr tablet Take one tablet daily at bedtime 07/21/16   Lennette Bihari, MD  metoprolol succinate (TOPROL-XL) 50 MG 24 hr tablet TAKE 1 TABLET (50 MG TOTAL) BY MOUTH DAILY. TAKE WITH OR IMMEDIATELY FOLLOWING A MEAL. 04/16/16   Lennette Bihari, MD  nitroGLYCERIN (NITROLINGUAL) 0.4 MG/SPRAY spray Place 1 spray under the tongue every 5 (five) minutes x 3 doses as needed for chest pain. 03/25/16   Lennette Bihari, MD  pantoprazole (PROTONIX) 40 MG tablet TAKE 1 TABLET (40 MG TOTAL) BY MOUTH 2 (TWO) TIMES DAILY. 04/16/16   Jessica Priest, MD  spironolactone (ALDACTONE) 25 MG tablet TAKE 1/2 TABLET (12.5 MG TOTAL) BY MOUTH DAILY. 02/19/16   Lennette Bihari, MD  telmisartan (MICARDIS) 80 MG tablet Take 80 mg by mouth daily.    Historical Provider, MD  torsemide (DEMADEX) 10 MG tablet Take 5 mg by mouth daily.     Historical Provider, MD  vitamin C (ASCORBIC ACID) 500 MG tablet Take 500 mg by mouth daily.    Historical Provider, MD  vitamin E 400 UNIT capsule Take 400 Units by mouth daily.    Historical Provider, MD  zinc gluconate 50 MG tablet Take 50 mg by mouth 2 (two) times a week.    Historical Provider, MD    Family History Family History  Problem Relation Age of Onset  . Cancer Mother   . Heart disease Father     died age 71  . Stroke Maternal Grandmother   . Cancer Maternal Grandfather     Social History Social History  Substance Use Topics  . Smoking status: Never Smoker  . Smokeless tobacco: Never Used  . Alcohol use No     Allergies   Altace [ramipril]; Mucinex [guaifenesin er]; and Contrast media [iodinated  diagnostic agents]   Review of Systems Review of Systems  Constitutional: Positive for fever (subjective).  Respiratory: Positive for shortness of breath (mild).   Cardiovascular: Negative for chest pain.  Gastrointestinal: Positive for abdominal pain (when palpating only).  Neurological: Positive for light-headedness. Negative for syncope and weakness.  All other systems reviewed and are negative.    Physical Exam Updated Vital Signs BP 120/74   Pulse 78   Temp 99.7 F (37.6 C) (Oral)   Ht 5\' 9"  (1.753 m)   Wt 205 lb (93 kg)   SpO2 96%   BMI 30.27 kg/m   Physical Exam CONSTITUTIONAL: Well developed/well nourished HEAD: Normocephalic/atraumatic EYES: EOMI/PERRL, no nystagmus, no ptosis ENMT: Mucous membranes moist NECK: supple no meningeal signs CV: Irregular with murmur LUNGS: Lungs are clear to auscultation bilaterally, no apparent distress ABDOMEN: soft, nontender, no rebound or guarding GU:no cva tenderness NEURO:Awake/alert, face symmetric, no arm or leg drift is noted Equal 5/5 strength with shoulder abduction, elbow flex/extension, wrist flex/extension in upper extremities and equal hand grips bilaterally Equal 5/5 strength with hip  flexion,knee flex/extension, foot dorsi/plantar flexion Cranial nerves 3/4/5/6/11/16/08/11/12 tested and intact No past pointing Sensation to light touch intact in all extremities EXTREMITIES: pulses normal, full ROM SKIN: warm, color normal PSYCH: no abnormalities of mood noted  ED Treatments / Results  Labs (all labs ordered are listed, but only abnormal results are displayed) Labs Reviewed  CBC WITH DIFFERENTIAL/PLATELET - Abnormal; Notable for the following:       Result Value   WBC 15.4 (*)    RBC 4.05 (*)    Hemoglobin 12.0 (*)    HCT 37.1 (*)    Neutro Abs 13.3 (*)    Monocytes Absolute 1.2 (*)    All other components within normal limits  BASIC METABOLIC PANEL - Abnormal; Notable for the following:    CO2 19 (*)    Glucose, Bld 106 (*)    BUN 32 (*)    Creatinine, Ser 1.76 (*)    GFR calc non Af Amer 35 (*)    GFR calc Af Amer 41 (*)    All other components within normal limits  URINALYSIS, ROUTINE W REFLEX MICROSCOPIC - Abnormal; Notable for the following:    APPearance HAZY (*)    All other components within normal limits  HEPATIC FUNCTION PANEL - Abnormal; Notable for the following:    Total Protein 6.0 (*)    Albumin 3.2 (*)    Alkaline Phosphatase 29 (*)    All other components within normal limits  APTT  LIPASE, BLOOD  TROPONIN I    EKG ED ECG REPORT   Date: 08/05/2016 2126  Rate: 88  Rhythm: atrial fibrillation  QRS Axis: left  Intervals: normal  ST/T Wave abnormalities: nonspecific ST changes  Conduction Disutrbances:right bundle branch block  Narrative Interpretation:   Old EKG Reviewed: changes noted  I have personally reviewed the EKG tracing and agree with the computerized printout as noted.  Radiology Dg Chest 2 View  Result Date: 08/06/2016 CLINICAL DATA:  Acute onset of cough and congestion. Generalized weakness. Initial encounter. EXAM: CHEST  2 VIEW COMPARISON:  Chest radiograph performed 11/20/2015 FINDINGS: The lungs are  well-aerated and clear. There is no evidence of focal opacification, pleural effusion or pneumothorax. The heart is borderline normal in size. The patient is status post median sternotomy. No acute osseous abnormalities are seen. IMPRESSION: No acute cardiopulmonary process seen. Electronically Signed   By: Beryle BeamsJeffery  Chang M.D.  On: 08/06/2016 02:42    Procedures Procedures    Medications Ordered in ED Medications - No data to display   Initial Impression / Assessment and Plan / ED Course  I have reviewed the triage vital signs and the nursing notes.  Pertinent labs & imaging results that were available during my care of the patient were reviewed by me and considered in my medical decision making     Pt here for dizziness No syncope No focal weakness or slurred speech to suggest stroke No CP reported I doubt ACS at this time  Here in the ED, he is ambulatory without difficulty There was no focal weakness to suggest acute stroke He felt improved He was mildly dehydrated He mentioned recent tick bites, but no significant rash and he declines antibiotics  As for his afib - this has been present since earlier this month.   This was documented on visit from 3/13 with dr Tresa Endo At that time it was noted that they will wait for DC cardioversion until April Pt is continuing eliquis This has been present for weeks, so I am not convinced this is cause of his dizziness that started on 3/28 I don't feel emergent cardioversion required  Pt is comfortable with discharge home  Final Clinical Impressions(s) / ED Diagnoses   Final diagnoses:  Dizziness  Renal insufficiency  Persistent atrial fibrillation (HCC)    New Prescriptions New Prescriptions   No medications on file  I personally performed the services described in this documentation, which was scribed in my presence. The recorded information has been reviewed and is accurate.       Jorge Rhine, MD 08/06/16 [phone]

## 2016-08-06 NOTE — Discharge Instructions (Signed)

## 2016-08-06 NOTE — ED Notes (Signed)
Pt back in room. No distress observed. 

## 2016-08-06 NOTE — ED Notes (Signed)
Ambulated pt in the hall pt ambulated well pt complains of dizziness no other complaint noted at this time

## 2016-08-06 NOTE — ED Notes (Signed)
Ambulates without difficulty.

## 2016-08-17 ENCOUNTER — Ambulatory Visit (INDEPENDENT_AMBULATORY_CARE_PROVIDER_SITE_OTHER): Payer: Medicare Other | Admitting: Cardiovascular Disease

## 2016-08-17 ENCOUNTER — Encounter: Payer: Self-pay | Admitting: Cardiovascular Disease

## 2016-08-17 VITALS — BP 136/84 | HR 60 | Ht 69.0 in | Wt 208.0 lb

## 2016-08-17 DIAGNOSIS — Z7901 Long term (current) use of anticoagulants: Secondary | ICD-10-CM

## 2016-08-17 DIAGNOSIS — I2510 Atherosclerotic heart disease of native coronary artery without angina pectoris: Secondary | ICD-10-CM

## 2016-08-17 DIAGNOSIS — N183 Chronic kidney disease, stage 3 unspecified: Secondary | ICD-10-CM

## 2016-08-17 DIAGNOSIS — I350 Nonrheumatic aortic (valve) stenosis: Secondary | ICD-10-CM

## 2016-08-17 DIAGNOSIS — E785 Hyperlipidemia, unspecified: Secondary | ICD-10-CM

## 2016-08-17 DIAGNOSIS — I480 Paroxysmal atrial fibrillation: Secondary | ICD-10-CM | POA: Diagnosis not present

## 2016-08-17 DIAGNOSIS — I10 Essential (primary) hypertension: Secondary | ICD-10-CM | POA: Diagnosis not present
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
## 2016-08-17 NOTE — Patient Instructions (Signed)
No changes with current medication or treatmenT    Your physician wants you to follow-up in 4 months with Dr Tresa Endo. You will receive a reminder letter in the mail two months in advance. If you don't receive a letter, please call our office to schedule the follow-up appointment.  If you need a refill on your cardiac medications before your next appointment, please call your pharmacy.

## 2016-08-21 ENCOUNTER — Ambulatory Visit: Payer: Medicare Other | Admitting: Cardiovascular Disease

## 2016-08-21 ENCOUNTER — Other Ambulatory Visit: Payer: Self-pay | Admitting: *Deleted

## 2016-08-21 MED ORDER — HYDRALAZINE HCL 25 MG PO TABS: 37.5000 mg | ORAL_TABLET | Freq: Three times a day (TID) | ORAL | 3 refills | Status: DC

## 2016-09-22 ENCOUNTER — Ambulatory Visit (INDEPENDENT_AMBULATORY_CARE_PROVIDER_SITE_OTHER): Payer: Medicare Other | Admitting: Allergy and Immunology

## 2016-09-22 ENCOUNTER — Encounter: Payer: Self-pay | Admitting: Allergy and Immunology

## 2016-09-22 VITALS — BP 170/102 | HR 60 | Resp 14

## 2016-09-22 DIAGNOSIS — J4520 Mild intermittent asthma, uncomplicated: Secondary | ICD-10-CM

## 2016-09-22 DIAGNOSIS — J3089 Other allergic rhinitis: Secondary | ICD-10-CM | POA: Diagnosis not present

## 2016-09-22 DIAGNOSIS — K219 Gastro-esophageal reflux disease without esophagitis: Secondary | ICD-10-CM

## 2016-09-22 DIAGNOSIS — H9313 Tinnitus, bilateral: Secondary | ICD-10-CM

## 2016-09-22 NOTE — Patient Instructions (Addendum)
  1. Continue action plan for asthma flare including Flovent 110 3 inhalations 3 times a day  2. Continue Dymista one spray each nostril one-2 times per day  3. Continue pantoprazole 40 mg daily  4. Continue ProAir HFA and antihistamine if needed  5. Can add ipratropium 0.06% nasal spray 2 sprays each nostril every 6 hours if needed to dry up nose. May use prior to eating.  6. Eliminate all caffeine consumption  7. Return to clinic in 6 months or earlier if problem   8. Revisit with Dr. Annalee GentaShoemaker concerning ear issue

## 2016-09-22 NOTE — Progress Notes (Signed)
Follow-up Note  Referring Provider: Chilton Greathouse, MD Primary Provider: Chilton Greathouse, MD Date of Office Visit: 09/22/2016  Subjective:   Jorge Cherry (DOB: 1935-05-28) is a 81 y.o. male who [facility] in re-evaluation of the following:  HPI: Leighton returns to this clinic in reevaluation of his intermittent asthma and allergic rhinoconjunctivitis and LPR. I last saw him in this clinic November 2017.  His asthma has been relatively inactive. He has not had to activate his action plan for an asthma flare and he rarely uses a short acting bronchodilator. His exercise is very limited secondary to angina. He did get infected with influenza in March 2018 for which he was treated with Tamiflu successfully. It does not sound as though he has required a systemic steroid to treat any issue with asthma.  His nose is doing very well. He does use Dymista mostly one time per day and he intermittently uses ipratropium averaging out to about 1 time per week.  His reflux is doing very well as is any issue with his throat related to reflux. In fact, he thinks that his reflux is much better ever since he consolidated his daily Tea consumption. His tea consumption is now down to approximately once or twice a week.  He believes that he is having side effects from the use of Imdur. He has been having problems with significant ringing in his ears and maybe some decreased cognitive issue but it does help his angina. He has seen Dr. Annalee Genta in the past for ear issues and he wonders if there is anything that can be done to address his ringing and hearing loss.  Allergies as of 09/22/2016      Reactions   Altace [ramipril] Other (See Comments)   Mouth swelling   Mucinex [guaifenesin Er] Hives   Mouth swelling   Contrast Media [iodinated Diagnostic Agents] Other (See Comments)   Made eyes change each time      Medication List      albuterol 108 (90 Base)  MCG/ACT inhaler Commonly known as:  PROAIR HFA INHALE TWO PUFFS EVERY 4-6 HOURS AS NEEDED FOR COUGH OR WHEEZE   apixaban 5 MG Tabs tablet Commonly known as:  ELIQUIS Take 1 tablet (5 mg total) by mouth 2 (two) times daily.   aspirin EC 81 MG tablet Take 40.5 mg by mouth daily.   atorvastatin 20 MG tablet Commonly known as:  LIPITOR Take 1 tablet by mouth daily.   Azelastine-Fluticasone 137-50 MCG/ACT Susp Commonly known as:  DYMISTA Use one to two sprays in each nostril once a day.   ezetimibe 10 MG tablet Commonly known as:  ZETIA TAKE 1 TABLET (10 MG TOTAL) BY MOUTH DAILY.   fenofibrate 145 MG tablet Commonly known as:  TRICOR Take 72.5 mg by mouth daily.   Fish Oil 1000 MG Caps Take 1 capsule by mouth daily.   fluticasone 110 MCG/ACT inhaler Commonly known as:  FLOVENT HFA Use 3 puffs 3 times daily as directed for asthma flareup   hydrALAZINE 25 MG tablet Commonly known as:  APRESOLINE Take 1.5 tablets (37.5 mg total) by mouth every 8 (eight) hours.   ipratropium 0.06 % nasal spray Commonly known as:  ATROVENT Can use two sprays in each nostril every six hours as needed to dry up nose.   isosorbide mononitrate 60 MG 24 hr tablet Commonly known as:  IMDUR Take 1.5 tablets in the AM and 1/2 tablet  in the PM   loratadine 10 MG tablet Commonly known as:  CLARITIN Take 10 mg by mouth daily.   metoprolol succinate 50 MG 24 hr tablet Commonly known as:  TOPROL-XL TAKE 1 TABLET (50 MG TOTAL) BY MOUTH DAILY. TAKE WITH OR IMMEDIATELY FOLLOWING A MEAL.   metoprolol succinate 25 MG 24 hr tablet Commonly known as:  TOPROL XL Take one tablet daily at bedtime   nitroGLYCERIN 0.4 MG/SPRAY spray Commonly known as:  NITROLINGUAL Place 1 spray under the tongue every 5 (five) minutes x 3 doses as needed for chest pain.   pantoprazole 40 MG tablet Commonly known as:  PROTONIX TAKE 1 TABLET (40 MG TOTAL) BY MOUTH 2 (TWO) TIMES DAILY.   spironolactone 25 MG  tablet Commonly known as:  ALDACTONE TAKE 1/2 TABLET (12.5 MG TOTAL) BY MOUTH DAILY.   telmisartan 80 MG tablet Commonly known as:  MICARDIS Take 80 mg by mouth daily.   torsemide 10 MG tablet Commonly known as:  DEMADEX Take 10 mg by mouth daily.   vitamin C 500 MG tablet Commonly known as:  ASCORBIC ACID Take 500 mg by mouth daily.   VITAMIN D-3 PO Take 1,000 Units by mouth daily.   vitamin E 400 UNIT capsule Take 400 Units by mouth daily.   zinc gluconate 50 MG tablet Take 50 mg by mouth 2 (two) times a week.       Past Medical History:  Diagnosis Date  . Aortic valve disorder    2D ECHO, 10/19/2011 - EF >55%, LA mild-moderately dilated, mild-moderate tricuspid regurgitation, mild-moderate valvular aortic stenosis, moderate calcification of aortic valve leaflets  . Atherosclerosis of renal artery (HCC)    RENAL DOPPLER, 12/10/2011 - Left renal artery demonstrated narrowing with elevated velocities consistent with a 1-59% diameter reduction  . Atrial fibrillation (HCC)   . CKD (chronic kidney disease) stage 3, GFR 30-59 ml/min   . Coronary artery disease    a. s/p CABG in 1994 b. s/p PCI with DES to SVG-D1 in 2003 c. low-risk NST in 01/2015  . Hypertension   . PAF (paroxysmal atrial fibrillation) (HCC)    a. on Eliquis    Past Surgical History:  Procedure Laterality Date  . CARDIAC CATHETERIZATION  02/27/2004   Coronary intervention and medical management  . CARDIAC CATHETERIZATION  03/04/2004   SVG supplying the diagonal vessel stented with a [id number] Taxus stent post dilated to 4.0 mm  . CATARACT EXTRACTION    . CORONARY ARTERY BYPASS GRAFT    . CORONARY STENT PLACEMENT    . INGUINAL HERNIA REPAIR Right   . TONSILLECTOMY AND ADENOIDECTOMY      Review of systems negative except as noted in HPI / PMHx or noted below:  Review of Systems  Constitutional: Negative.   HENT: Negative.   Eyes: Negative.   Respiratory: Negative.   Cardiovascular: Negative.    Gastrointestinal: Negative.   Genitourinary: Negative.   Musculoskeletal: Negative.   Skin: Negative.   Neurological: Negative.   Endo/Heme/Allergies: Negative.   Psychiatric/Behavioral: Negative.      Objective:   Vitals:   09/22/16 1042  BP: (!) 170/102  Pulse: 60  Resp: 14          Physical Exam  Constitutional: He is well-developed, well-nourished, and in no distress.  HENT:  Head: Normocephalic.  Right Ear: Tympanic membrane, external ear and ear canal normal.  Left Ear: Tympanic membrane, external ear and ear canal normal.  Nose: Nose normal. No mucosal edema or  rhinorrhea.  Mouth/Throat: Uvula is midline, oropharynx is clear and moist and mucous membranes are normal. No oropharyngeal exudate.  Right hearing aid  Eyes: Conjunctivae are normal.  Neck: Trachea normal. No tracheal tenderness present. No tracheal deviation present. No thyromegaly present.  Cardiovascular: Normal rate, regular rhythm, S1 normal and S2 normal.   Murmur (systolic) heard. Pulmonary/Chest: Breath sounds normal. No stridor. No respiratory distress. He has no wheezes. He has no rales.  Musculoskeletal: He exhibits no edema.  Lymphadenopathy:       Head (right side): No tonsillar adenopathy present.       Head (left side): No tonsillar adenopathy present.    He has no cervical adenopathy.  Neurological: He is alert. Gait normal.  Skin: No rash noted. He is not diaphoretic. No erythema. Nails show no clubbing.  Psychiatric: Mood and affect normal.    Diagnostics:    Spirometry was performed and demonstrated an FEV1 of 2.64 at 97 % of predicted.  Assessment and Plan:   1. Asthma, mild intermittent, well-controlled   2. Other allergic rhinitis   3. LPRD (laryngopharyngeal reflux disease)   4. Tinnitus of both ears     1. Continue action plan for asthma flare including Flovent 110 3 inhalations 3 times a day  2. Continue Dymista one spray each nostril one-2 times per day  3.  Continue pantoprazole 40 mg daily  4. Continue ProAir HFA and antihistamine if needed  5. Can add ipratropium 0.06% nasal spray 2 sprays each nostril every 6 hours if needed to dry up nose. May use prior to eating.  6. Eliminate all caffeine consumption  7. Return to clinic in 6 months or earlier if problem   8. Revisit with Dr. Annalee Genta concerning ear issue  Justen appears to be doing quite well concerning his respiratory tract issue which is a combination of atopic disease and reflux. He will continue on the plan mentioned above and I will see him back in this clinic in 6 months or earlier if there is a problem. I encouraged him to continue to remain away from caffeine as much as possible as this is obviously helping his reflux. I also recommended that he go back to see Dr. Annalee Genta to see if anything can be done regarding his tinnitus and hearing loss.  Laurette Schimke, MD Allergy / Immunology [facility]

## 2016-10-20 ENCOUNTER — Other Ambulatory Visit: Payer: Self-pay | Admitting: Cardiovascular Disease

## 2016-11-06 ENCOUNTER — Other Ambulatory Visit: Payer: Self-pay | Admitting: Cardiovascular Disease

## 2016-11-06 NOTE — Telephone Encounter (Signed)
Rx(s) sent to pharmacy electronically.  

## 2016-11-15 ENCOUNTER — Telehealth: Payer: Self-pay | Admitting: Student

## 2016-11-15 NOTE — Telephone Encounter (Addendum)
   Received a message from the answering service that the patient's wife called reporting he was having worsening chest pain over the past few days. I attempted to call both numbers on file and could not get a response.   Signed, Ellsworth LennoxBrittany M Keondra Haydu, PA-C 11/15/2016, 2:27 PM Pager: [phone]  Called the patient back and was able to get a response. I talked with both the patient and his wife. He has been experiencing worsening anginal symptoms over the past month, now having chest pain and dyspnea on exertion. He denies any symptoms currently or over the past few days as he "has been taking it easy". Says the increased Imdur dosing has not provided much relief. He says symptoms are similar to when he required prior CABG and Dr. Tresa EndoKelly had talked with him recently about a repeat cardiac catheterization. With his progressive symptoms, this seems like the next logical step. [facility] so this can be addressed with Dr. Tresa EndoKelly early this week as he would need an appointment with Dr. Tresa EndoKelly or an APP since his last visit was in 08/2016. He is aware that if he develops recurrent pain that persists and is not relieved with SL NTG, then he should call EMS.   Signed, Ellsworth LennoxBrittany M Rashon Rezek, PA-C 11/15/2016, 4:30 PM Pager: [phone]

## 2016-11-17 NOTE — Progress Notes (Signed)
 Cardiology Office Note    Date:  11/18/2016   ID:  Jorge Cherry, DOB 12/31/1935, MRN [id number]  PCP:  Avva, Ravisankar, MD  Cardiologist: Dr. Kelly   Chief Complaint  Patient presents with  . other    Discuss cath c/o cp. Meds reviewed verbally with pt.    History of Present Illness:    Jorge Cherry is a 80 y.o. male with past medical history of CAD (s/p CABG in 1994, s/p PCI with DES to SVG-D1 in 2003, low-risk NST in 01/2015), chronic diastolic CHF, PAF (on Eliquis), HTN, HLD, Stage 3 CKD and mild AS (by echo in 11/2015) who presents to the office today for evaluation of worsening chest pain.   He was examined by Dr. Kelly in 06/2016 and reported worsening chest pain despite his Imdur being increased from 60mg daily to 90mg daily in 05/2016, therefore this was further titrated to 90mg in AM and 30mg in PM. Creatinine was checked at that time and stable at 1.65, but it was recommended he come back for reevaluation prior to undergoing a cardiac catheterization. In 07/2016, he reported resolution of his chest pain with the recent dose adjustment of Imdur. An echo was obtained in the interim which showed an EF of 60-65% with moderate AS (mean gradient of 21 mmHg).   [facility] was evaluated [facility] on 08/06/2016 for lightheadedness and found to be back in rate-control atrial fibrillation with a HR of 88. At the time of his office visit on 4/9, he was back in NSR. Was continued on his current medication regimen at that time.   He called the after-hours line on 7/8 and reported having worsening chest pain and dyspnea on exertion, therefore close follow-up was arranged with anticipation of a cardiac catheterization in the near future.   In talking with the patient today, he reports having episodes of chest discomfort occurring with exertion for the past month. He notes associated dyspnea with this. Denies any diaphoresis, nausea, or vomiting. This initially occurred with walking up hills but  he now notices this when walking for over 100 feet on level ground. He describes this as a tightness along his left pectoral region which resolves with rest. He just refilled his spray nitroglycerin but has not had to utilize this within the past few days.  He reports good compliance with Eliquis. Did experience darker stools when consuming blackberries and says they "irritated his diverticulosis" but these symptoms have since resolved. Hgb checked by PCP two weeks ago and at 10.6. Went for repeat bloodwork today.   Past Medical History:  Diagnosis Date  . Aortic valve disorder    2D ECHO, 10/19/2011 - EF >55%, LA mild-moderately dilated, mild-moderate tricuspid regurgitation, mild-moderate valvular aortic stenosis, moderate calcification of aortic valve leaflets  . Atherosclerosis of renal artery (HCC)    RENAL DOPPLER, 12/10/2011 - Left renal artery demonstrated narrowing with elevated velocities consistent with a 1-59% diameter reduction  . Atrial fibrillation (HCC)   . CKD (chronic kidney disease) stage 3, GFR 30-59 ml/min   . Coronary artery disease    a. s/p CABG in 1994 b. s/p PCI with DES to SVG-D1 in 2003 c. low-risk NST in 01/2015  . Hypertension   . PAF (paroxysmal atrial fibrillation) (HCC)    a. on Eliquis    Past Surgical History:  Procedure Laterality Date  . CARDIAC CATHETERIZATION  02/27/2004   Coronary intervention and medical management  . CARDIAC CATHETERIZATION  03/04/2004     SVG supplying the diagonal vessel stented with a 3.5x16mm Taxus stent post dilated to 4.0 mm  . CATARACT EXTRACTION    . CORONARY ARTERY BYPASS GRAFT    . CORONARY STENT PLACEMENT    . INGUINAL HERNIA REPAIR Right   . TONSILLECTOMY AND ADENOIDECTOMY      Current Medications: Outpatient Medications Prior to Visit  Medication Sig Dispense Refill  . albuterol (PROAIR HFA) 108 (90 Base) MCG/ACT inhaler INHALE TWO PUFFS EVERY 4-6 HOURS AS NEEDED FOR COUGH OR WHEEZE 1 Inhaler 1  . apixaban  (ELIQUIS) 5 MG TABS tablet Take 1 tablet (5 mg total) by mouth 2 (two) times daily. 60 tablet 11  . aspirin EC 81 MG tablet Take 40.5 mg by mouth daily.    . atorvastatin (LIPITOR) 20 MG tablet Take 1 tablet by mouth daily.    . Azelastine-Fluticasone (DYMISTA) 137-50 MCG/ACT SUSP Use one to two sprays in each nostril once a day. 23 g 5  . Cholecalciferol (VITAMIN D-3 PO) Take 1,000 Units by mouth daily.     . ezetimibe (ZETIA) 10 MG tablet TAKE 1 TABLET (10 MG TOTAL) BY MOUTH DAILY. 90 tablet 3  . fenofibrate (TRICOR) 145 MG tablet Take 72.5 mg by mouth daily.     . fluticasone (FLOVENT HFA) 110 MCG/ACT inhaler Use 3 puffs 3 times daily as directed for asthma flareup 12 g 5  . hydrALAZINE (APRESOLINE) 25 MG tablet Take 1.5 tablets (37.5 mg total) by mouth every 8 (eight) hours. 270 tablet 3  . ipratropium (ATROVENT) 0.06 % nasal spray Can use two sprays in each nostril every six hours as needed to dry up nose. 15 mL 5  . isosorbide mononitrate (IMDUR) 60 MG 24 hr tablet Take 1.5 tablets in the AM and 1/2 tablet in the PM 60 tablet 11  . loratadine (CLARITIN) 10 MG tablet Take 10 mg by mouth daily.    . metoprolol succinate (TOPROL-XL) 50 MG 24 hr tablet TAKE 1 TABLET (50 MG TOTAL) BY MOUTH DAILY. TAKE WITH OR IMMEDIATELY FOLLOWING A MEAL. 30 tablet 10  . nitroGLYCERIN (NITROLINGUAL) 0.4 MG/SPRAY spray PLACE 1 SPRAY UNDER THE TONGUE EVERY 5 (FIVE) MINUTES X 3 DOSES AS NEEDED FOR CHEST PAIN. 12 g 0  . Omega-3 Fatty Acids (FISH OIL) 1000 MG CAPS Take 1 capsule by mouth daily.    . pantoprazole (PROTONIX) 40 MG tablet TAKE 1 TABLET (40 MG TOTAL) BY MOUTH 2 (TWO) TIMES DAILY. 60 tablet 4  . spironolactone (ALDACTONE) 25 MG tablet Take 0.5 tablets (12.5 mg total) by mouth daily. 45 tablet 2  . telmisartan (MICARDIS) 80 MG tablet Take 80 mg by mouth daily.    . torsemide (DEMADEX) 10 MG tablet Take 10 mg by mouth daily.     . vitamin C (ASCORBIC ACID) 500 MG tablet Take 500 mg by mouth daily.    .  vitamin E 400 UNIT capsule Take 400 Units by mouth daily.    . zinc gluconate 50 MG tablet Take 50 mg by mouth 2 (two) times a week.    . fenofibrate (TRICOR) 145 MG tablet TAKE 1 TABLET (145 MG TOTAL) BY MOUTH DAILY. (Patient not taking: Reported on 11/18/2016) 30 tablet 10  . metoprolol succinate (TOPROL XL) 25 MG 24 hr tablet Take one tablet daily at bedtime (Patient not taking: Reported on 11/18/2016) 90 tablet 3   No facility-administered medications prior to visit.      Allergies:   Altace [ramipril]; Mucinex [guaifenesin er]; and Contrast   media [iodinated diagnostic agents]   Social History   Social History  . Marital status: Married    Spouse name: N/A  . Number of children: 2  . Years of education: N/A   Social History Main Topics  . Smoking status: Never Smoker  . Smokeless tobacco: Never Used  . Alcohol use No  . Drug use: No  . Sexual activity: Not Asked   Other Topics Concern  . None   Social History Narrative   Lives at home with wife.  Retired from [employer] department.       Family History:  The patient's family history includes Cancer in his maternal grandfather and mother; Heart disease in his father; Stroke in his maternal grandmother.   Review of Systems:   Please see the history of present illness.     General:  No chills, fever, night sweats or weight changes.  Cardiovascular:  No edema, orthopnea, palpitations, paroxysmal nocturnal dyspnea. Positive for chest pain and dyspnea on exertion.  Dermatological: No rash, lesions/masses Respiratory: No cough, dyspnea Urologic: No hematuria, dysuria Abdominal:   No nausea, vomiting, diarrhea, bright red blood per rectum, melena, or hematemesis Neurologic:  No visual changes, wkns, changes in mental status. All other systems reviewed and are otherwise negative except as noted above.   Physical Exam:    VS:  BP (!) 159/81 (BP Location: Left Arm, Patient Position: Sitting, Cuff Size: Large)   Pulse 61    Ht 5' 9" (1.753 m)   Wt 208 lb (94.3 kg)   BMI 30.72 kg/m    General: Well developed, well nourished Caucasian male appearing in no acute distress. Head: Normocephalic, atraumatic, sclera non-icteric, no xanthomas, nares are without discharge.  Neck: No carotid bruits. JVD not elevated.  Lungs: Respirations regular and unlabored, without wheezes or rales.  Heart: Regular rate and rhythm. No S3 or S4.  No rubs, or gallops appreciated. 2/6 SEM along RUSB.  Abdomen: Soft, non-tender, non-distended with normoactive bowel sounds. No hepatomegaly. No rebound/guarding. No obvious abdominal masses. Msk:  Strength and tone appear normal for age. No joint deformities or effusions. Extremities: No clubbing or cyanosis. No edema.  Distal pedal pulses are 2+ bilaterally. Neuro: Alert and oriented X 3. Moves all extremities spontaneously. No focal deficits noted. Psych:  Responds to questions appropriately with a normal affect. Skin: No rashes or lesions noted  Wt Readings from Last 3 Encounters:  11/18/16 208 lb (94.3 kg)  08/17/16 208 lb (94.3 kg)  08/05/16 205 lb (93 kg)     Studies/Labs Reviewed:   EKG:  EKG is ordered today.  The ekg ordered today demonstrates NSR, HR 61, with no acute ST or T-wave changes when compared to prior tracings.   Recent Labs: 11/20/2015: B Natriuretic Peptide 605.3 11/23/2015: Magnesium 2.0 07/01/2016: TSH 2.32 08/05/2016: Hemoglobin 12.0; Platelets 237 08/06/2016: ALT 18 11/18/2016: BUN 25; Creatinine, Ser 1.55; Potassium 4.3; Sodium 139   Lipid Panel    Component Value Date/Time   CHOL 106 07/01/2016 1345   CHOL 137 04/09/2014 1115   TRIG 120 07/01/2016 1345   TRIG 182 (H) 04/09/2014 1115   HDL 35 (L) 07/01/2016 1345   HDL 32 (L) 04/09/2014 1115   CHOLHDL 3.0 07/01/2016 1345   VLDL 24 07/01/2016 1345   LDLCALC 47 07/01/2016 1345   LDLCALC 69 04/09/2014 1115    Additional studies/ records that were reviewed today include:   Echocardiogram:  07/2016 Study Conclusions  - Left ventricle: The cavity size was normal.   There was moderate   focal basal hypertrophy of the septum. Systolic function was   normal. The estimated ejection fraction was in the range of 60%   to 65%. Wall motion was normal; there were no regional wall   motion abnormalities. Doppler parameters are consistent with   abnormal left ventricular relaxation (grade 1 diastolic   dysfunction). The E/e&' ratio is <8, suggesting normal LV filling   pressure. - Aortic valve: Trileaflet; mildly calcified leaflets. Moderate   stenosis. Mean gradient (S): 21 mm Hg. Peak gradient (S): 34 mm   Hg. Valve area (VTI): 1.33 cm^2. Valve area (Vmax): 1.44 cm^2.   Valve area (Vmean): 1.38 cm^2. - Mitral valve: Mildly thickened leaflets . There was mild   regurgitation. - Left atrium: The atrium was normal in size. - Right ventricle: The cavity size was normal. Wall thickness was   normal. Systolic function is mildly reduced. - Tricuspid valve: There was trivial regurgitation. - Pulmonary arteries: PA peak pressure: 32 mm Hg (S). - Inferior vena cava: The vessel was normal in size. The   respirophasic diameter changes were in the normal range (>= 50%),   consistent with normal central venous pressure.  Impressions:  - Compared to a prior study in 2017, there is now moderate aortic   stenosis with LVEF 60-65%. The mean gradient has increased from   15 to 21 mmHg - estimated AVA of 1.3-1.4 cm2.   Assessment:    1. Unstable angina (HCC)   2. Pre-procedural cardiovascular examination   3. Preoperative testing   4. Coronary artery disease involving native coronary artery of native heart with unstable angina pectoris (HCC)   5. Chronic diastolic heart failure (HCC)   6. Paroxysmal atrial fibrillation (HCC)   7. Aortic valve stenosis, etiology of cardiac valve disease unspecified   8. Chronic renal insufficiency, stage 3 (moderate)      Plan:   In order of problems  listed above:  1. Unstable Angina - He has continued to experience worsening chest pain and dyspnea on exertion occurring for the past 4 weeks. He has been managed medically for the past several months with titration of his Imdur dosing but reports his symtpoms have continued to progress. Now experiencing chest pain when walking from the car to inside his home. No resting symptoms. - his EKG today is without acute ischemic changes.  - discussed via the phone with Dr. Kelly. Will plan for definitive evaluation with a cardiac catheterization. The patient understands that risks include but are not limited to stroke (1 in 1000), death (1 in 1000), kidney failure [usually temporary] (1 in 500), bleeding (1 in 200), allergic reaction [possibly serious] (1 in 200). Will recheck BMET today. Instructed the patient to hold Torsemide and Spironolactone the day prior to his procedure. Hold Eliquis 36 hours prior to the procedure. He will arrive to short-stay earlier that morning for hydration. He does report having "vision issues" in the past with contrast but says they later thought it was not due to contrast. Will still treat for contrast allergy. The patient is aware if he develops chest pain at rest or symptoms that do not resolve with NTG, then he should proceed directly to the ED.   2. CAD - s/p CABG in 1994 with DES to SVG-D1 in 2003, low-risk NST in 01/2015. - plan for cardiac catheterization as above.  - continue Imdur, BB, and statin therapy. No ASA secondary to need for Eliquis.   3. Chronic diastolic CHF -   he does not appear volume overloaded by physical examination today. - continue current medication regimen. He will hold Torsemide and Spironolactone the day prior to his procedure. Will continue with Micardis as I am concerned his BP might become significantly elevated if we hold several of his medications.  4. PAF - he denies any recent palpitations. Continue Toprol-XL 50mg daily for  rate-control - he is on Eliquis for anticoagulation. Did experience darker stools when consuming blackberries last month and says they "irritated his diverticulosis" but these symptoms have since resolved. Hgb checked by PCP two weeks ago and at 10.6. Awaiting repeat CBC (checked by PCP earlier today) but he denies any recurrent melena.   5. Aortic Stenosis - echo in 07/2016 showed moderate AS. - continue to follow with routine echocardiogram.   6. Stage 3 CKD - baseline creatinine 1.6- 1.7. - recheck BMET today. Plan for hydration as above on the same day as cath unless creatinine has significantly changed.   7. HLD - Lipid Panel on 7/2 showed total cholesterol of 92, HDL 25,and LDL 40. - at goal with LDL < 70. Continue Atorvastatin 20mg daily.    Medication Adjustments/Labs and Tests Ordered: Current medicines are reviewed at length with the patient today.  Concerns regarding medicines are outlined above.  Medication changes, Labs and Tests ordered today are listed in the Patient Instructions below. Patient Instructions  Medication Instructions:  Your physician recommends that you continue on your current medications as directed. Please refer to the Current Medication list given to you today. - See below for instructions for medications in relation to your heart catheterization.   Labwork: Your physician recommends that you return for lab work in: TODAY (BMP, PT/INR) - Please go to the [facility]. You will check in at the front desk to the right as you walk into the atrium. Valet Parking is offered if needed.    Testing/Procedures:  You are scheduled for a Left Heart Catheterization on 11/24/16 with Dr. Kelly.  1. Please arrive at the [facility] (Main Entrance A) at [facility]: [address] [place], [place] [address] at 06:30 AM (this is a few hours before your procedure to ensure your preparation). Free valet parking service is available.   Special note:  Every effort is made to have your procedure done on time. Please understand that emergencies sometimes delay scheduled procedures.  2. Diet: Do not eat or drink anything after midnight prior to your procedure except sips of water to take medications.  3. Labs: TODAY (BMP, PT/INR)  4. Medication instructions in preparation for your procedure:   A) DO NOT TAKE your Eliquis for 36 hours prior to procedure, LAST dose will be Sunday morning.   B) DO NOT TAKE your Torsemide and spironolactone the day before or morning of the procedure.  On the morning of your procedure, take your remaining medications NOT listed above.  You may use sips of water.  5. Plan for one night stay--bring personal belongings.  6. Bring a current list of your medications and current insurance cards.  7. You MUST have a responsible person to drive you home.  8. Someone MUST be with you the first 24 hours after you arrive home or your discharge will be delayed.  9. Please wear clothes that are easy to get on and off and wear slip-on shoes.  Thank you for allowing us to care for you!   -- [facility]  Follow-Up: Will be determined   after you have had your procedure.   Signed, Kista Robb M Ricahrd Schwager, PA-C  11/18/2016 7:53 PM    [facility] [address], [address] [place], [place]  [address] Phone: [phone]; Fax: [phone]  [address], [address] [place], [place] [address] Phone: [phone]  

## 2016-11-18 ENCOUNTER — Encounter: Payer: Self-pay | Admitting: Student

## 2016-11-18 ENCOUNTER — Other Ambulatory Visit
Admission: RE | Admit: 2016-11-18 | Discharge: 2016-11-18 | Disposition: A | Discharge status: Home / Self Care | Payer: Medicare Other | Source: Ambulatory Visit | Attending: Student | Admitting: Student

## 2016-11-18 ENCOUNTER — Ambulatory Visit (INDEPENDENT_AMBULATORY_CARE_PROVIDER_SITE_OTHER): Payer: Medicare Other | Admitting: Student

## 2016-11-18 VITALS — BP 159/81 | HR 61 | Ht 69.0 in | Wt 208.0 lb

## 2016-11-18 DIAGNOSIS — Z01818 Encounter for other preprocedural examination: Secondary | ICD-10-CM

## 2016-11-18 DIAGNOSIS — I350 Nonrheumatic aortic (valve) stenosis: Secondary | ICD-10-CM | POA: Diagnosis not present

## 2016-11-18 DIAGNOSIS — I200 Unstable angina: Secondary | ICD-10-CM

## 2016-11-18 DIAGNOSIS — I25110 Atherosclerotic heart disease of native coronary artery with unstable angina pectoris: Secondary | ICD-10-CM | POA: Diagnosis not present

## 2016-11-18 DIAGNOSIS — Z01810 Encounter for preprocedural cardiovascular examination: Secondary | ICD-10-CM

## 2016-11-18 DIAGNOSIS — I480 Paroxysmal atrial fibrillation: Secondary | ICD-10-CM

## 2016-11-18 DIAGNOSIS — N183 Chronic kidney disease, stage 3 unspecified: Secondary | ICD-10-CM

## 2016-11-18 DIAGNOSIS — E785 Hyperlipidemia, unspecified: Secondary | ICD-10-CM

## 2016-11-18 DIAGNOSIS — I5032 Chronic diastolic (congestive) heart failure: Secondary | ICD-10-CM

## 2016-11-18 LAB — BASIC METABOLIC PANEL
Anion gap: 6 (ref 5–15)
BUN: 25 mg/dL — ABNORMAL HIGH (ref 6–20)
CHLORIDE: 109 mmol/L (ref 101–111)
CO2: 24 mmol/L (ref 22–32)
Calcium: 8.9 mg/dL (ref 8.9–10.3)
Creatinine, Ser: 1.55 mg/dL — ABNORMAL HIGH (ref 0.61–1.24)
GFR calc non Af Amer: 41 mL/min — ABNORMAL LOW (ref >60)
GFR, EST AFRICAN AMERICAN: 48 mL/min — AB (ref >60)
Glucose, Bld: 95 mg/dL (ref 65–99)
POTASSIUM: 4.3 mmol/L (ref 3.5–5.1)
SODIUM: 139 mmol/L (ref 135–145)

## 2016-11-18 LAB — PROTIME-INR
INR: 1.31
Prothrombin Time: 16.4 seconds — ABNORMAL HIGH (ref 11.4–15.2)

## 2016-11-18 NOTE — Patient Instructions (Signed)
Medication Instructions:  Your physician recommends that you continue on your current medications as directed. Please refer to the Current Medication list given to you today. - See below for instructions for medications in relation to your heart catheterization.   Labwork: Your physician recommends that you return for lab work in: TODAY (BMP, PT/INR) - Please go to the [facility]. You will check in at the front desk to the right as you walk into the atrium. Valet Parking is offered if needed.    Testing/Procedures:  You are scheduled for a Left Heart Catheterization on 11/24/16 with Dr. Tresa Endo.  1. Please arrive at the [facility] (Main Entrance A) at [facility]: [address] [place], [place] [address] at 06:30 AM (this is a few hours before your procedure to ensure your preparation). Free valet parking service is available.   Special note: Every effort is made to have your procedure done on time. Please understand that emergencies sometimes delay scheduled procedures.  2. Diet: Do not eat or drink anything after midnight prior to your procedure except sips of water to take medications.  3. Labs: TODAY (BMP, PT/INR)  4. Medication instructions in preparation for your procedure:   A) DO NOT TAKE your Eliquis for 36 hours prior to procedure, LAST dose will be Sunday morning.   B) DO NOT TAKE your Torsemide and spironolactone the day before or morning of the procedure.  On the morning of your procedure, take your remaining medications NOT listed above.  You may use sips of water.  5. Plan for one night stay--bring personal belongings.  6. Bring a current list of your medications and current insurance cards.  7. You MUST have a responsible person to drive you home.  8. Someone MUST be with you the first 24 hours after you arrive home or your discharge will be delayed.  9. Please wear clothes that are easy to get on and off and wear slip-on  shoes.  Thank you for allowing [place] to care for you!   --  Invasive Cardiovascular services    Follow-Up: Will be determined after you have had your procedure.   If you need a refill on your cardiac medications before your next appointment, please call your pharmacy.    Coronary Angiogram With Stent Coronary angiogram with stent placement is a procedure to widen or open a narrow blood vessel of the heart (coronary artery). Arteries may become blocked by cholesterol buildup (plaques) in the lining or wall. When a coronary artery becomes partially blocked, blood flow to that area decreases. This may lead to chest pain or a heart attack (myocardial infarction). A stent is a small piece of metal that looks like mesh or a spring. Stent placement may be done as treatment for a heart attack or right after a coronary angiogram in which a blocked artery is found. Let your health care provider know about:  Any allergies you have.  All medicines you are taking, including vitamins, herbs, eye drops, creams, and over-the-counter medicines.  Any problems you or family members have had with anesthetic medicines.  Any blood disorders you have.  Any surgeries you have had.  Any medical conditions you have.  Whether you are pregnant or may be pregnant. What are the risks? Generally, this is a safe procedure. However, problems may occur, including:  Damage to the heart or its blood vessels.  A return of blockage.  Bleeding, infection, or bruising at the insertion site.  A  collection of blood under the skin (hematoma) at the insertion site.  A blood clot in another part of the body.  Kidney injury.  Allergic reaction to the dye or contrast that is used.  Bleeding into the abdomen (retroperitoneal bleeding).  What happens before the procedure? Staying hydrated Follow instructions from your health care provider about hydration, which may include:  Up to 2 hours before the  procedure - you may continue to drink clear liquids, such as water, clear fruit juice, black coffee, and plain tea.  Eating and drinking restrictions Follow instructions from your health care provider about eating and drinking, which may include:  8 hours before the procedure - stop eating heavy meals or foods such as meat, fried foods, or fatty foods.  6 hours before the procedure - stop eating light meals or foods, such as toast or cereal.  2 hours before the procedure - stop drinking clear liquids.  Ask your health care provider about:  Changing or stopping your regular medicines. This is especially important if you are taking diabetes medicines or blood thinners.  Taking medicines such as ibuprofen. These medicines can thin your blood. Do not take these medicines before your procedure if your health care provider instructs you not to. Generally, aspirin is recommended before a procedure of passing a small, thin tube (catheter) through a blood vessel and into the heart (cardiac catheterization).  What happens during the procedure?  An IV tube will be inserted into one of your veins.  You will be given one or more of the following: ? A medicine to help you relax (sedative). ? A medicine to numb the area where the catheter will be inserted into an artery (local anesthetic).  To reduce your risk of infection: ? Your health care team will wash or sanitize their hands. ? Your skin will be washed with soap. ? Hair may be removed from the area where the catheter will be inserted.  Using a guide wire, the catheter will be inserted into an artery. The location may be in your groin, in your wrist, or in the fold of your arm (near your elbow).  A type of X-ray (fluoroscopy) will be used to help guide the catheter to the opening of the arteries in the heart.  A dye will be injected into the catheter, and X-rays will be taken. The dye will help to show where any narrowing or blockages are  located in the arteries.  A tiny wire will be guided to the blocked spot, and a balloon will be inflated to make the artery wider.  The stent will be expanded and will crush the plaques into the wall of the vessel. The stent will hold the area open and improve the blood flow. Most stents have a drug coating to reduce the risk of the stent narrowing over time.  The artery may be made wider using a drill, laser, or other tools to remove plaques.  When the blood flow is better, the catheter will be removed. The lining of the artery will grow over the stent, which stays where it was placed. This procedure may vary among health care providers and hospitals. What happens after the procedure?  If the procedure is done through the leg, you will be kept in bed lying flat for about 6 hours. You will be instructed to not bend and not cross your legs.  The insertion site will be checked frequently.  The pulse in your foot or wrist will be checked  frequently.  You may have additional blood tests, X-rays, and a test that records the electrical activity of your heart (electrocardiogram, or ECG). This information is not intended to replace advice given to you by your health care provider. Make sure you discuss any questions you have with your health care provider. Document Released: 11/01/2002 Document Revised: 12/26/2015 Document Reviewed: 12/01/2015 Elsevier Interactive Patient Education  2017 [employer].

## 2016-11-19 ENCOUNTER — Telehealth: Payer: Self-pay | Admitting: Student

## 2016-11-19 ENCOUNTER — Other Ambulatory Visit: Payer: Self-pay | Admitting: Student

## 2016-11-19 ENCOUNTER — Telehealth: Payer: Self-pay | Admitting: Cardiovascular Disease

## 2016-11-19 NOTE — Telephone Encounter (Signed)
[facility] 12/15/16 with Dr Tresa EndoKelly.  Records put with Dr Landry DykeKelly's schedule for 12/15/16. lp

## 2016-11-19 NOTE — Telephone Encounter (Signed)
Called [facility] and nurse will fax us the results of patient's lab work. Awaiting lab results at this time.

## 2016-11-19 NOTE — Telephone Encounter (Signed)
Called and spoke with both patient and wife.  Gave them results of labs per [place] PA-C:  "Notes recorded by Ellsworth LennoxStrader, Brittany M, PA-C on 11/19/2016 at 12:55 PM EDT Hgb has remained low for the past several months. He denied any evidence of active bleeding at the time of his office visit. Continue further evaluation of anemia per PCP. Continue with plans for cath. Thank you!"   Patient stated he did have some "medium red colored blood" surround the water of his bowel movement this morning.  It was that one time and has not had anymore since this morning. Denies abdominal pain. Says he had blackberries over the past few days and fried fish last night which he thinks caused it. Patient and wife are very adamant about having the heart cath and wants to proceed with that plan. Advised he should make his PCP aware of the bleeding as well.  PCP has put in a referral to Dr Madilyn FiremanHayes with GI and is waiting for an appt. Patient says he will eat soft foods leading up to the procedure. Advised to continue to monitor for bleeding and to make us and PCP aware if it continues or worsens. I will route to [place] Strader for further advice.

## 2016-11-19 NOTE — Telephone Encounter (Signed)
Patient spouse calling stating they were told by PCP to call us for the labs on patient were still low   They are wanting to make sure we are okay with that and if patient is able to still do cath on Tuesday   Please advise.

## 2016-11-19 NOTE — Telephone Encounter (Signed)
Notified patient and wife of recommendations and they verbalized understanding.

## 2016-11-19 NOTE — Telephone Encounter (Signed)
Thank you for the update. Will forward to Dr. Tresa EndoKelly as well so he is aware prior to the procedure as that could influnece his decision on PCI. With his progressive symptoms, I am in agreement that we proceed with cath. Reported a history of diverticulosis which he thought was aggravated by the seeds. Will need to follow closely post-procedure as well for he is on Eliquis. Thanks.

## 2016-11-23 ENCOUNTER — Telehealth: Payer: Self-pay

## 2016-11-23 ENCOUNTER — Other Ambulatory Visit: Payer: Self-pay | Admitting: Student

## 2016-11-23 NOTE — Telephone Encounter (Signed)
Patient [facility] scheduled for: 11/24/2016 @ 1200 Verified arrival time and place:  Pt to arrive at [facility] at 6:30 for pre procedure hydration and treatment for dye allergy Confirmed AM meds to be taken pre-cath with sip of water:  Notified Pt to take a full baby ASA prior to arrival.  Pt indicates understanding. Confirmed patient has responsible person to drive home post procedure and observe patient for 24 hours:  wife Addl concerns:  Pt with dye allergy.  Pre meds ordered to be given in short stay.  Pt with CKD, Cr 1.55.

## 2016-11-24 ENCOUNTER — Encounter (HOSPITAL_COMMUNITY): Payer: Self-pay | Admitting: General Practice

## 2016-11-24 ENCOUNTER — Ambulatory Visit (HOSPITAL_COMMUNITY)
Admission: RE | Admit: 2016-11-24 | Discharge: 2016-11-25 | Disposition: A | Discharge status: Home / Self Care | Payer: Medicare Other | Source: Ambulatory Visit | Attending: Cardiovascular Disease | Admitting: Cardiovascular Disease

## 2016-11-24 ENCOUNTER — Ambulatory Visit (HOSPITAL_COMMUNITY)
Admission: RE | Disposition: A | Discharge status: Home / Self Care | Payer: Self-pay | Source: Ambulatory Visit | Attending: Cardiovascular Disease

## 2016-11-24 DIAGNOSIS — Z91041 Radiographic dye allergy status: Secondary | ICD-10-CM | POA: Diagnosis not present

## 2016-11-24 DIAGNOSIS — Z7982 Long term (current) use of aspirin: Secondary | ICD-10-CM | POA: Diagnosis not present

## 2016-11-24 DIAGNOSIS — I2089 Other forms of angina pectoris: Secondary | ICD-10-CM

## 2016-11-24 DIAGNOSIS — N183 Chronic kidney disease, stage 3 (moderate): Secondary | ICD-10-CM | POA: Insufficient documentation

## 2016-11-24 DIAGNOSIS — I5032 Chronic diastolic (congestive) heart failure: Secondary | ICD-10-CM | POA: Diagnosis not present

## 2016-11-24 DIAGNOSIS — I2510 Atherosclerotic heart disease of native coronary artery without angina pectoris: Secondary | ICD-10-CM | POA: Diagnosis present

## 2016-11-24 DIAGNOSIS — I701 Atherosclerosis of renal artery: Secondary | ICD-10-CM | POA: Insufficient documentation

## 2016-11-24 DIAGNOSIS — Z955 Presence of coronary angioplasty implant and graft: Secondary | ICD-10-CM | POA: Insufficient documentation

## 2016-11-24 DIAGNOSIS — I25118 Atherosclerotic heart disease of native coronary artery with other forms of angina pectoris: Secondary | ICD-10-CM

## 2016-11-24 DIAGNOSIS — I25710 Atherosclerosis of autologous vein coronary artery bypass graft(s) with unstable angina pectoris: Secondary | ICD-10-CM | POA: Diagnosis not present

## 2016-11-24 DIAGNOSIS — I350 Nonrheumatic aortic (valve) stenosis: Secondary | ICD-10-CM | POA: Insufficient documentation

## 2016-11-24 DIAGNOSIS — I130 Hypertensive heart and chronic kidney disease with heart failure and stage 1 through stage 4 chronic kidney disease, or unspecified chronic kidney disease: Secondary | ICD-10-CM | POA: Diagnosis not present

## 2016-11-24 DIAGNOSIS — I2582 Chronic total occlusion of coronary artery: Secondary | ICD-10-CM | POA: Diagnosis not present

## 2016-11-24 DIAGNOSIS — I10 Essential (primary) hypertension: Secondary | ICD-10-CM | POA: Diagnosis present

## 2016-11-24 DIAGNOSIS — I480 Paroxysmal atrial fibrillation: Secondary | ICD-10-CM | POA: Diagnosis not present

## 2016-11-24 DIAGNOSIS — E785 Hyperlipidemia, unspecified: Secondary | ICD-10-CM | POA: Insufficient documentation

## 2016-11-24 DIAGNOSIS — D649 Anemia, unspecified: Secondary | ICD-10-CM

## 2016-11-24 DIAGNOSIS — Z7901 Long term (current) use of anticoagulants: Secondary | ICD-10-CM | POA: Diagnosis not present

## 2016-11-24 DIAGNOSIS — I208 Other forms of angina pectoris: Secondary | ICD-10-CM

## 2016-11-24 HISTORY — DX: Gastro-esophageal reflux disease without esophagitis: K21.9

## 2016-11-24 HISTORY — DX: Pure hypercholesterolemia, unspecified: E78.00

## 2016-11-24 HISTORY — DX: Personal history of other diseases of the digestive system: Z87.19

## 2016-11-24 HISTORY — PX: RIGHT HEART CATH AND CORONARY/GRAFT ANGIOGRAPHY: CATH118265

## 2016-11-24 HISTORY — DX: Unspecified osteoarthritis, unspecified site: M19.90

## 2016-11-24 HISTORY — PX: CARDIAC CATHETERIZATION: SHX172

## 2016-11-24 LAB — POCT I-STAT 3, VENOUS BLOOD GAS (G3P V)
Acid-base deficit: 4 mmol/L — ABNORMAL HIGH (ref 0.0–2.0)
Acid-base deficit: 4 mmol/L — ABNORMAL HIGH (ref 0.0–2.0)
BICARBONATE: 20.3 mmol/L (ref 20.0–28.0)
Bicarbonate: 21.9 mmol/L (ref 20.0–28.0)
O2 SAT: 63 %
O2 SAT: 98 %
PH VEN: 7.383 (ref 7.250–7.430)
PO2 VEN: 35 mmHg (ref 32.0–45.0)
TCO2: 21 mmol/L (ref 0–100)
TCO2: 23 mmol/L (ref 0–100)
pCO2, Ven: 34.1 mmHg — ABNORMAL LOW (ref 44.0–60.0)
pCO2, Ven: 41.8 mmHg — ABNORMAL LOW (ref 44.0–60.0)
pH, Ven: 7.327 (ref 7.250–7.430)
pO2, Ven: 112 mmHg — ABNORMAL HIGH (ref 32.0–45.0)

## 2016-11-24 LAB — CBC
HEMATOCRIT: 25.7 % — AB (ref 39.0–52.0)
HEMOGLOBIN: 8 g/dL — AB (ref 13.0–17.0)
MCH: 27.9 pg (ref 26.0–34.0)
MCHC: 31.1 g/dL (ref 30.0–36.0)
MCV: 89.5 fL (ref 78.0–100.0)
Platelets: 223 10*3/uL (ref 150–400)
RBC: 2.87 MIL/uL — ABNORMAL LOW (ref 4.22–5.81)
RDW: 13.8 % (ref 11.5–15.5)
WBC: 6.4 10*3/uL (ref 4.0–10.5)

## 2016-11-24 SURGERY — RIGHT HEART CATH AND CORONARY/GRAFT ANGIOGRAPHY
Anesthesia: LOCAL

## 2016-11-24 MED ORDER — MIDAZOLAM HCL 2 MG/2ML IJ SOLN
INTRAMUSCULAR | Status: DC | PRN
  Administered 2016-11-24: 2 mg via INTRAVENOUS

## 2016-11-24 MED ORDER — ISOSORBIDE MONONITRATE ER 30 MG PO TB24
30.0000 mg | ORAL_TABLET | Freq: Every day | ORAL | Status: DC
  Administered 2016-11-24: 21:00:00 30 mg via ORAL
  Filled 2016-11-24: qty 1

## 2016-11-24 MED ORDER — SODIUM CHLORIDE 0.9 % IV SOLN: 250.0000 mL | INTRAVENOUS | Status: DC | PRN

## 2016-11-24 MED ORDER — IOPAMIDOL (ISOVUE-370) INJECTION 76%
INTRAVENOUS | Status: AC
  Filled 2016-11-24: qty 100

## 2016-11-24 MED ORDER — IOPAMIDOL (ISOVUE-370) INJECTION 76%
INTRAVENOUS | Status: AC
  Filled 2016-11-24: qty 50

## 2016-11-24 MED ORDER — SODIUM CHLORIDE 0.9% FLUSH
3.0000 mL | Freq: Two times a day (BID) | INTRAVENOUS | Status: DC
  Administered 2016-11-24: 3 mL via INTRAVENOUS

## 2016-11-24 MED ORDER — TORSEMIDE 20 MG PO TABS
10.0000 mg | ORAL_TABLET | Freq: Every day | ORAL | Status: DC
  Filled 2016-11-24: qty 1

## 2016-11-24 MED ORDER — ASPIRIN EC 81 MG PO TBEC: 81.0000 mg | DELAYED_RELEASE_TABLET | Freq: Every evening | ORAL | Status: DC

## 2016-11-24 MED ORDER — AZELASTINE-FLUTICASONE 137-50 MCG/ACT NA SUSP: 1.0000 | Freq: Every day | NASAL | Status: DC | PRN

## 2016-11-24 MED ORDER — APIXABAN 5 MG PO TABS
5.0000 mg | ORAL_TABLET | Freq: Two times a day (BID) | ORAL | Status: DC
  Filled 2016-11-24: qty 1

## 2016-11-24 MED ORDER — SODIUM CHLORIDE 0.9% FLUSH: 3.0000 mL | INTRAVENOUS | Status: DC | PRN

## 2016-11-24 MED ORDER — ONDANSETRON HCL 4 MG/2ML IJ SOLN: 4.0000 mg | Freq: Four times a day (QID) | INTRAMUSCULAR | Status: DC | PRN

## 2016-11-24 MED ORDER — PANTOPRAZOLE SODIUM 40 MG PO TBEC
40.0000 mg | DELAYED_RELEASE_TABLET | Freq: Two times a day (BID) | ORAL | Status: DC
  Administered 2016-11-24 – 2016-11-25 (×2): 40 mg via ORAL
  Filled 2016-11-24 (×2): qty 1

## 2016-11-24 MED ORDER — MIDAZOLAM HCL 2 MG/2ML IJ SOLN
INTRAMUSCULAR | Status: AC
  Filled 2016-11-24: qty 2

## 2016-11-24 MED ORDER — LIDOCAINE HCL (PF) 1 % IJ SOLN
INTRAMUSCULAR | Status: DC | PRN
  Administered 2016-11-24: 20 mL

## 2016-11-24 MED ORDER — FAMOTIDINE 20 MG PO TABS
ORAL_TABLET | ORAL | Status: AC
  Administered 2016-11-24: 20 mg via ORAL
  Filled 2016-11-24: qty 1

## 2016-11-24 MED ORDER — VERAPAMIL HCL 2.5 MG/ML IV SOLN
INTRAVENOUS | Status: AC
  Filled 2016-11-24: qty 2

## 2016-11-24 MED ORDER — HEPARIN (PORCINE) IN NACL 2-0.9 UNIT/ML-% IJ SOLN
INTRAMUSCULAR | Status: AC
  Filled 2016-11-24: qty 1000

## 2016-11-24 MED ORDER — LABETALOL HCL 5 MG/ML IV SOLN
INTRAVENOUS | Status: AC
  Filled 2016-11-24: qty 4

## 2016-11-24 MED ORDER — ACETAMINOPHEN 325 MG PO TABS: 650.0000 mg | ORAL_TABLET | ORAL | Status: DC | PRN

## 2016-11-24 MED ORDER — SODIUM CHLORIDE 0.9 % IV SOLN
INTRAVENOUS | Status: DC
  Administered 2016-11-25: 01:00:00 via INTRAVENOUS

## 2016-11-24 MED ORDER — ASPIRIN EC 81 MG PO TBEC
81.0000 mg | DELAYED_RELEASE_TABLET | Freq: Every day | ORAL | Status: DC
  Filled 2016-11-24: qty 1

## 2016-11-24 MED ORDER — DIPHENHYDRAMINE HCL 50 MG/ML IJ SOLN
INTRAMUSCULAR | Status: AC
  Administered 2016-11-24: 25 mg via INTRAVENOUS
  Filled 2016-11-24: qty 1

## 2016-11-24 MED ORDER — METOPROLOL SUCCINATE ER 25 MG PO TB24
25.0000 mg | ORAL_TABLET | Freq: Every day | ORAL | Status: DC
  Administered 2016-11-24: 25 mg via ORAL
  Filled 2016-11-24: qty 1

## 2016-11-24 MED ORDER — IOPAMIDOL (ISOVUE-370) INJECTION 76%
INTRAVENOUS | Status: AC
Start: 2016-11-24 — End: 2016-11-24
  Filled 2016-11-24: qty 100

## 2016-11-24 MED ORDER — HYDRALAZINE HCL 20 MG/ML IJ SOLN
10.0000 mg | Freq: Four times a day (QID) | INTRAMUSCULAR | Status: DC | PRN
  Administered 2016-11-24: 20:00:00 10 mg via INTRAVENOUS
  Filled 2016-11-24: qty 1

## 2016-11-24 MED ORDER — SODIUM CHLORIDE 0.9% FLUSH: 3.0000 mL | Freq: Two times a day (BID) | INTRAVENOUS | Status: DC

## 2016-11-24 MED ORDER — IOPAMIDOL (ISOVUE-370) INJECTION 76%
INTRAVENOUS | Status: DC | PRN
  Administered 2016-11-24: 185 mL via INTRA_ARTERIAL

## 2016-11-24 MED ORDER — FAMOTIDINE 20 MG PO TABS
20.0000 mg | ORAL_TABLET | ORAL | Status: AC
  Administered 2016-11-24: 20 mg via ORAL

## 2016-11-24 MED ORDER — METOPROLOL SUCCINATE ER 25 MG PO TB24
50.0000 mg | ORAL_TABLET | Freq: Every day | ORAL | Status: DC
  Administered 2016-11-25: 50 mg via ORAL
  Filled 2016-11-24: qty 2

## 2016-11-24 MED ORDER — HEPARIN (PORCINE) IN NACL 2-0.9 UNIT/ML-% IJ SOLN
INTRAMUSCULAR | Status: AC | PRN
  Administered 2016-11-24: 1500 mL

## 2016-11-24 MED ORDER — SODIUM CHLORIDE 0.9 % WEIGHT BASED INFUSION
1.0000 mL/kg/h | INTRAVENOUS | Status: DC
  Administered 2016-11-24: 1 mL/kg/h via INTRAVENOUS

## 2016-11-24 MED ORDER — HYDRALAZINE HCL 25 MG PO TABS
37.5000 mg | ORAL_TABLET | Freq: Three times a day (TID) | ORAL | Status: DC
  Administered 2016-11-24 – 2016-11-25 (×2): 37.5 mg via ORAL
  Filled 2016-11-24 (×2): qty 2

## 2016-11-24 MED ORDER — FENTANYL CITRATE (PF) 100 MCG/2ML IJ SOLN
INTRAMUSCULAR | Status: DC | PRN
  Administered 2016-11-24: 25 ug via INTRAVENOUS

## 2016-11-24 MED ORDER — SODIUM CHLORIDE 0.9 % WEIGHT BASED INFUSION
3.0000 mL/kg/h | INTRAVENOUS | Status: DC
  Administered 2016-11-24: 3 mL/kg/h via INTRAVENOUS

## 2016-11-24 MED ORDER — LIDOCAINE HCL (PF) 1 % IJ SOLN
INTRAMUSCULAR | Status: AC
  Filled 2016-11-24: qty 30

## 2016-11-24 MED ORDER — HYDRALAZINE HCL 20 MG/ML IJ SOLN
INTRAMUSCULAR | Status: AC
  Filled 2016-11-24: qty 1

## 2016-11-24 MED ORDER — DIPHENHYDRAMINE HCL 50 MG/ML IJ SOLN
25.0000 mg | INTRAMUSCULAR | Status: AC
  Administered 2016-11-24: 25 mg via INTRAVENOUS

## 2016-11-24 MED ORDER — METHYLPREDNISOLONE SODIUM SUCC 125 MG IJ SOLR
INTRAMUSCULAR | Status: AC
  Administered 2016-11-24: 125 mg via INTRAVENOUS
  Filled 2016-11-24: qty 2

## 2016-11-24 MED ORDER — DIAZEPAM 5 MG PO TABS: 5.0000 mg | ORAL_TABLET | Freq: Four times a day (QID) | ORAL | Status: DC | PRN

## 2016-11-24 MED ORDER — HYDRALAZINE HCL 20 MG/ML IJ SOLN
5.0000 mg | Freq: Once | INTRAMUSCULAR | Status: AC
  Administered 2016-11-24 (×2): 5 mg via INTRAVENOUS

## 2016-11-24 MED ORDER — SPIRONOLACTONE 25 MG PO TABS
12.5000 mg | ORAL_TABLET | Freq: Every day | ORAL | Status: DC
  Filled 2016-11-24: qty 1

## 2016-11-24 MED ORDER — SODIUM CHLORIDE 0.9 % IV SOLN
INTRAVENOUS | Status: AC
  Filled 2016-11-24: qty 500

## 2016-11-24 MED ORDER — LABETALOL HCL 5 MG/ML IV SOLN
10.0000 mg | Freq: Once | INTRAVENOUS | Status: AC
  Administered 2016-11-24: 10 mg via INTRAVENOUS

## 2016-11-24 MED ORDER — METHYLPREDNISOLONE SODIUM SUCC 125 MG IJ SOLR
125.0000 mg | INTRAMUSCULAR | Status: AC
  Administered 2016-11-24: 125 mg via INTRAVENOUS

## 2016-11-24 MED ORDER — FENTANYL CITRATE (PF) 100 MCG/2ML IJ SOLN
INTRAMUSCULAR | Status: AC
  Filled 2016-11-24: qty 2

## 2016-11-24 MED ORDER — ISOSORBIDE MONONITRATE ER 30 MG PO TB24
90.0000 mg | ORAL_TABLET | Freq: Every day | ORAL | Status: DC
  Administered 2016-11-25: 10:00:00 90 mg via ORAL
  Filled 2016-11-24: qty 3

## 2016-11-24 MED ORDER — ASPIRIN 81 MG PO CHEW: 81.0000 mg | CHEWABLE_TABLET | ORAL | Status: DC

## 2016-11-24 SURGICAL SUPPLY — 19 items
CATH INFINITI 5 FR IM (CATHETERS) ×2 IMPLANT
CATH INFINITI 5 FR RCB (CATHETERS) ×2 IMPLANT
CATH INFINITI 5FR MULTPACK ANG (CATHETERS) ×2 IMPLANT
CATH SWAN GANZ 7F STRAIGHT (CATHETERS) ×2 IMPLANT
GLIDESHEATH SLEND SS 6F .021 (SHEATH) IMPLANT
GUIDEWIRE 3MM J TIP .035 145 (WIRE) ×2 IMPLANT
GUIDEWIRE INQWIRE 1.5J.035X260 (WIRE) IMPLANT
INQWIRE 1.5J .035X260CM (WIRE)
KIT ENCORE 26 ADVANTAGE (KITS) IMPLANT
KIT HEART LEFT (KITS) ×2 IMPLANT
KIT HEART RIGHT NAMIC (KITS) ×2 IMPLANT
PACK CARDIAC CATHETERIZATION (CUSTOM PROCEDURE TRAY) ×2 IMPLANT
SHEATH PINNACLE 5F 10CM (SHEATH) ×4 IMPLANT
SHEATH PINNACLE 6F 10CM (SHEATH) IMPLANT
SHEATH PINNACLE 7F 10CM (SHEATH) ×2 IMPLANT
SYR MEDRAD MARK V 150ML (SYRINGE) ×2 IMPLANT
TRANSDUCER W/STOPCOCK (MISCELLANEOUS) ×4 IMPLANT
TUBING CIL FLEX 10 FLL-RA (TUBING) ×2 IMPLANT
WIRE EMERALD ST .035X260CM (WIRE) ×2 IMPLANT

## 2016-11-24 NOTE — Progress Notes (Signed)
Site area: Rt fem art and rt fem venous Site Prior to Removal:  Level o Pressure Applied For: Manual:   yes Patient Status During Pull:  A/O Post Pull Site:  Level 0 Post Pull Instructions Given:  Yes and pt understands post instructions Post Pull Pulses Present: 2+rt dp/Pt Dressing Applied:  tegaderm and a 4x4 Bedrest begins @ 18:00:00 Comments: Pt leaves cath lab holding area in stable condition and rt groin is unremarkable. Rt grin dressing is CDI.

## 2016-11-24 NOTE — Interval H&P Note (Signed)
Cath Lab Visit (complete for each Cath Lab visit)  Clinical Evaluation Leading to the Procedure:   ACS: No.  Non-ACS:    Anginal Classification: CCS III  Anti-ischemic medical therapy: Maximal Therapy (2 or more classes of medications)  Non-Invasive Test Results: No non-invasive testing performed  Prior CABG: Previous CABG      History and Physical Interval Note:  11/24/2016 2:31 PM  Corderius S [place]  has presented today for surgery, with the diagnosis of unsable angina  The various methods of treatment have been discussed with the patient and family. After consideration of risks, benefits and other options for treatment, the patient has consented to  Procedure(s): Left Heart Cath and Cors/Grafts Angiography (N/A) as a surgical intervention .  The patient's history has been reviewed, patient examined, no change in status, stable for surgery.  I have reviewed the patient's chart and labs.  Questions were answered to the patient's satisfaction.     Nicki Guadalajarahomas Kelly

## 2016-11-24 NOTE — H&P (View-Only) (Signed)
Cardiology Office Note    Date:  11/18/2016   ID:  Jorge Cherry, DOB [birth year], MRN [id number]  PCP:  Jorge Greathouse, MD  Cardiologist: Dr. Tresa Endo   Chief Complaint  Patient presents with  . other    Discuss cath c/o cp. Meds reviewed verbally with pt.    History of Present Illness:    Jorge Cherry is a 81 y.o. male with past medical history of CAD (s/p CABG in 1994, s/p PCI with DES to SVG-D1 in 2003, low-risk NST in 01/2015), chronic diastolic CHF, PAF (on Eliquis), HTN, HLD, Stage 3 CKD and mild AS (by echo in 11/2015) who presents to the office today for evaluation of worsening chest pain.   He was examined by Dr. Tresa Endo in 06/2016 and reported worsening chest pain despite his Imdur being increased from 60mg  daily to 90mg  daily in 05/2016, therefore this was further titrated to 90mg  in AM and 30mg  in PM. Creatinine was checked at that time and stable at 1.65, but it was recommended he come back for reevaluation prior to undergoing a cardiac catheterization. In 07/2016, he reported resolution of his chest pain with the recent dose adjustment of Imdur. An echo was obtained in the interim which showed an EF of 60-65% with moderate AS (mean gradient of 21 mmHg).   He was evaluated at [facility] ED on 08/06/2016 for lightheadedness and found to be back in rate-control atrial fibrillation with a HR of 88. At the time of his office visit on 4/9, he was back in NSR. Was continued on his current medication regimen at that time.   He called the after-hours line on 7/8 and reported having worsening chest pain and dyspnea on exertion, therefore close follow-up was arranged with anticipation of a cardiac catheterization in the near future.   In talking with the patient today, he reports having episodes of chest discomfort occurring with exertion for the past month. He notes associated dyspnea with this. Denies any diaphoresis, nausea, or vomiting. This initially occurred with walking up hills but  he now notices this when walking for over 100 feet on level ground. He describes this as a tightness along his left pectoral region which resolves with rest. He just refilled his spray nitroglycerin but has not had to utilize this within the past few days.  He reports good compliance with Eliquis. Did experience darker stools when consuming blackberries and says they "irritated his diverticulosis" but these symptoms have since resolved. Hgb checked by PCP two weeks ago and at 10.6. Went for repeat bloodwork today.   Past Medical History:  Diagnosis Date  . Aortic valve disorder    2D ECHO, 10/19/2011 - EF >55%, LA mild-moderately dilated, mild-moderate tricuspid regurgitation, mild-moderate valvular aortic stenosis, moderate calcification of aortic valve leaflets  . Atherosclerosis of renal artery (HCC)    RENAL DOPPLER, 12/10/2011 - Left renal artery demonstrated narrowing with elevated velocities consistent with a 1-59% diameter reduction  . Atrial fibrillation (HCC)   . CKD (chronic kidney disease) stage 3, GFR 30-59 ml/min   . Coronary artery disease    a. s/p CABG in 1994 b. s/p PCI with DES to SVG-D1 in 2003 c. low-risk NST in 01/2015  . Hypertension   . PAF (paroxysmal atrial fibrillation) (HCC)    a. on Eliquis    Past Surgical History:  Procedure Laterality Date  . CARDIAC CATHETERIZATION  02/27/2004   Coronary intervention and medical management  . CARDIAC CATHETERIZATION  03/04/2004  SVG supplying the diagonal vessel stented with a 3.5x34mm Taxus stent post dilated to 4.0 mm  . CATARACT EXTRACTION    . CORONARY ARTERY BYPASS GRAFT    . CORONARY STENT PLACEMENT    . INGUINAL HERNIA REPAIR Right   . TONSILLECTOMY AND ADENOIDECTOMY      Current Medications: Outpatient Medications Prior to Visit  Medication Sig Dispense Refill  . albuterol (PROAIR HFA) 108 (90 Base) MCG/ACT inhaler INHALE TWO PUFFS EVERY 4-6 HOURS AS NEEDED FOR COUGH OR WHEEZE 1 Inhaler 1  . apixaban  (ELIQUIS) 5 MG TABS tablet Take 1 tablet (5 mg total) by mouth 2 (two) times daily. 60 tablet 11  . aspirin EC 81 MG tablet Take 40.5 mg by mouth daily.    [name] atorvastatin (LIPITOR) 20 MG tablet Take 1 tablet by mouth daily.    . Azelastine-Fluticasone (DYMISTA) 137-50 MCG/ACT SUSP Use one to two sprays in each nostril once a day. 23 g 5  . Cholecalciferol (VITAMIN D-3 PO) Take 1,000 Units by mouth daily.     [name] ezetimibe (ZETIA) 10 MG tablet TAKE 1 TABLET (10 MG TOTAL) BY MOUTH DAILY. 90 tablet 3  . fenofibrate (TRICOR) 145 MG tablet Take 72.5 mg by mouth daily.     . fluticasone (FLOVENT HFA) 110 MCG/ACT inhaler Use 3 puffs 3 times daily as directed for asthma flareup 12 g 5  . hydrALAZINE (APRESOLINE) 25 MG tablet Take 1.5 tablets (37.5 mg total) by mouth every 8 (eight) hours. 270 tablet 3  . ipratropium (ATROVENT) 0.06 % nasal spray Can use two sprays in each nostril every six hours as needed to dry up nose. 15 mL 5  . isosorbide mononitrate (IMDUR) 60 MG 24 hr tablet Take 1.5 tablets in the AM and 1/2 tablet in the PM 60 tablet 11  . loratadine (CLARITIN) 10 MG tablet Take 10 mg by mouth daily.    . metoprolol succinate (TOPROL-XL) 50 MG 24 hr tablet TAKE 1 TABLET (50 MG TOTAL) BY MOUTH DAILY. TAKE WITH OR IMMEDIATELY FOLLOWING A MEAL. 30 tablet 10  . nitroGLYCERIN (NITROLINGUAL) 0.4 MG/SPRAY spray PLACE 1 SPRAY UNDER THE TONGUE EVERY 5 (FIVE) MINUTES X 3 DOSES AS NEEDED FOR CHEST PAIN. 12 g 0  . Omega-3 Fatty Acids (FISH OIL) 1000 MG CAPS Take 1 capsule by mouth daily.    . pantoprazole (PROTONIX) 40 MG tablet TAKE 1 TABLET (40 MG TOTAL) BY MOUTH 2 (TWO) TIMES DAILY. 60 tablet 4  . spironolactone (ALDACTONE) 25 MG tablet Take 0.5 tablets (12.5 mg total) by mouth daily. 45 tablet 2  . telmisartan (MICARDIS) 80 MG tablet Take 80 mg by mouth daily.    [name] torsemide (DEMADEX) 10 MG tablet Take 10 mg by mouth daily.     . vitamin C (ASCORBIC ACID) 500 MG tablet Take 500 mg by mouth daily.    .  vitamin E 400 UNIT capsule Take 400 Units by mouth daily.    [name] zinc gluconate 50 MG tablet Take 50 mg by mouth 2 (two) times a week.    . fenofibrate (TRICOR) 145 MG tablet TAKE 1 TABLET (145 MG TOTAL) BY MOUTH DAILY. (Patient not taking: Reported on 11/18/2016) 30 tablet 10  . metoprolol succinate (TOPROL XL) 25 MG 24 hr tablet Take one tablet daily at bedtime (Patient not taking: Reported on 11/18/2016) 90 tablet 3   No facility-administered medications prior to visit.      Allergies:   Altace [ramipril]; Mucinex [guaifenesin er]; and Contrast  media [iodinated diagnostic agents]   Social History   Social History  . Marital status: Married    Spouse name: N/A  . Number of children: 2  . Years of education: N/A   Social History Main Topics  . Smoking status: Never Smoker  . Smokeless tobacco: Never Used  . Alcohol use No  . Drug use: No  . Sexual activity: Not Asked   Other Topics Concern  . None   Social History Narrative   Lives at home with wife.  Retired from [employer].       Family History:  The patient's family history includes Cancer in his maternal grandfather and mother; Heart disease in his father; Stroke in his maternal grandmother.   Review of Systems:   Please see the history of present illness.     General:  No chills, fever, night sweats or weight changes.  Cardiovascular:  No edema, orthopnea, palpitations, paroxysmal nocturnal dyspnea. Positive for chest pain and dyspnea on exertion.  Dermatological: No rash, lesions/masses Respiratory: No cough, dyspnea Urologic: No hematuria, dysuria Abdominal:   No nausea, vomiting, diarrhea, bright red blood per rectum, melena, or hematemesis Neurologic:  No visual changes, wkns, changes in mental status. All other systems reviewed and are otherwise negative except as noted above.   Physical Exam:    VS:  BP (!) 159/81 (BP Location: Left Arm, Patient Position: Sitting, Cuff Size: Large)   Pulse 61    Ht 5\' 9"  (1.753 m)   Wt 208 lb (94.3 kg)   BMI 30.72 kg/m    General: Well developed, well nourished Caucasian male appearing in no acute distress. Head: Normocephalic, atraumatic, sclera non-icteric, no xanthomas, nares are without discharge.  Neck: No carotid bruits. JVD not elevated.  Lungs: Respirations regular and unlabored, without wheezes or rales.  Heart: Regular rate and rhythm. No S3 or S4.  No rubs, or gallops appreciated. 2/6 SEM along RUSB.  Abdomen: Soft, non-tender, non-distended with normoactive bowel sounds. No hepatomegaly. No rebound/guarding. No obvious abdominal masses. Msk:  Strength and tone appear normal for age. No joint deformities or effusions. Extremities: No clubbing or cyanosis. No edema.  Distal pedal pulses are 2+ bilaterally. Neuro: Alert and oriented X 3. Moves all extremities spontaneously. No focal deficits noted. Psych:  Responds to questions appropriately with a normal affect. Skin: No rashes or lesions noted  Wt Readings from Last 3 Encounters:  11/18/16 208 lb (94.3 kg)  08/17/16 208 lb (94.3 kg)  08/05/16 205 lb (93 kg)     Studies/Labs Reviewed:   EKG:  EKG is ordered today.  The ekg ordered today demonstrates NSR, HR 61, with no acute ST or T-wave changes when compared to prior tracings.   Recent Labs: 11/20/2015: B Natriuretic Peptide 605.3 11/23/2015: Magnesium 2.0 07/01/2016: TSH 2.32 08/05/2016: Hemoglobin 12.0; Platelets 237 08/06/2016: ALT 18 11/18/2016: BUN 25; Creatinine, Ser 1.55; Potassium 4.3; Sodium 139   Lipid Panel    Component Value Date/Time   CHOL 106 07/01/2016 1345   CHOL 137 04/09/2014 1115   TRIG 120 07/01/2016 1345   TRIG 182 (H) 04/09/2014 1115   HDL 35 (L) 07/01/2016 1345   HDL 32 (L) 04/09/2014 1115   CHOLHDL 3.0 07/01/2016 1345   VLDL 24 07/01/2016 1345   LDLCALC 47 07/01/2016 1345   LDLCALC 69 04/09/2014 1115    Additional studies/ records that were reviewed today include:   Echocardiogram:  07/2016 Study Conclusions  - Left ventricle: The cavity size was normal.  There was moderate   focal basal hypertrophy of the septum. Systolic function was   normal. The estimated ejection fraction was in the range of 60%   to 65%. Wall motion was normal; there were no regional wall   motion abnormalities. Doppler parameters are consistent with   abnormal left ventricular relaxation (grade 1 diastolic   dysfunction). The E/e&' ratio is <8, suggesting normal LV filling   pressure. - Aortic valve: Trileaflet; mildly calcified leaflets. Moderate   stenosis. Mean gradient (S): 21 mm Hg. Peak gradient (S): 34 mm   Hg. Valve area (VTI): 1.33 cm^2. Valve area (Vmax): 1.44 cm^2.   Valve area (Vmean): 1.38 cm^2. - Mitral valve: Mildly thickened leaflets . There was mild   regurgitation. - Left atrium: The atrium was normal in size. - Right ventricle: The cavity size was normal. Wall thickness was   normal. Systolic function is mildly reduced. - Tricuspid valve: There was trivial regurgitation. - Pulmonary arteries: PA peak pressure: 32 mm Hg (S). - Inferior vena cava: The vessel was normal in size. The   respirophasic diameter changes were in the normal range (>= 50%),   consistent with normal central venous pressure.  Impressions:  - Compared to a prior study in 2017, there is now moderate aortic   stenosis with LVEF 60-65%. The mean gradient has increased from   15 to 21 mmHg - estimated AVA of 1.3-1.4 cm2.   Assessment:    1. Unstable angina (HCC)   2. Pre-procedural cardiovascular examination   3. Preoperative testing   4. Coronary artery disease involving native coronary artery of native heart with unstable angina pectoris (HCC)   5. Chronic diastolic heart failure (HCC)   6. Paroxysmal atrial fibrillation (HCC)   7. Aortic valve stenosis, etiology of cardiac valve disease unspecified   8. Chronic renal insufficiency, stage 3 (moderate)      Plan:   In order of problems  listed above:  1. Unstable Angina - He has continued to experience worsening chest pain and dyspnea on exertion occurring for the past 4 weeks. He has been managed medically for the past several months with titration of his Imdur dosing but reports his symtpoms have continued to progress. Now experiencing chest pain when walking from the car to inside his home. No resting symptoms. - his EKG today is without acute ischemic changes.  - discussed via the phone with Dr. Tresa Endo. Will plan for definitive evaluation with a cardiac catheterization. The patient understands that risks include but are not limited to stroke (1 in 1000), death (1 in 1000), kidney failure [usually temporary] (1 in 500), bleeding (1 in 200), allergic reaction [possibly serious] (1 in 200). Will recheck BMET today. Instructed the patient to hold Torsemide and Spironolactone the day prior to his procedure. Hold Eliquis 36 hours prior to the procedure. He will arrive to short-stay earlier that morning for hydration. He does report having "vision issues" in the past with contrast but says they later thought it was not due to contrast. Will still treat for contrast allergy. The patient is aware if he develops chest pain at rest or symptoms that do not resolve with NTG, then he should proceed directly to the ED.   2. CAD - s/p CABG in 1994 with DES to SVG-D1 in 2003, low-risk NST in 01/2015. - plan for cardiac catheterization as above.  - continue Imdur, BB, and statin therapy. No ASA secondary to need for Eliquis.   3. Chronic diastolic CHF -  he does not appear volume overloaded by physical examination today. - continue current medication regimen. He will hold Torsemide and Spironolactone the day prior to his procedure. Will continue with Micardis as I am concerned his BP might become significantly elevated if we hold several of his medications.  4. PAF - he denies any recent palpitations. Continue Toprol-XL 50mg  daily for  rate-control - he is on Eliquis for anticoagulation. Did experience darker stools when consuming blackberries last month and says they "irritated his diverticulosis" but these symptoms have since resolved. Hgb checked by PCP two weeks ago and at 10.6. Awaiting repeat CBC (checked by PCP earlier today) but he denies any recurrent melena.   5. Aortic Stenosis - echo in 07/2016 showed moderate AS. - continue to follow with routine echocardiogram.   6. Stage 3 CKD - baseline creatinine 1.6- 1.7. - recheck BMET today. Plan for hydration as above on the same day as cath unless creatinine has significantly changed.   7. HLD - Lipid Panel on 7/2 showed total cholesterol of 92, HDL 25,and LDL 40. - at goal with LDL < 70. Continue Atorvastatin 20mg  daily.    Medication Adjustments/Labs and Tests Ordered: Current medicines are reviewed at length with the patient today.  Concerns regarding medicines are outlined above.  Medication changes, Labs and Tests ordered today are listed in the Patient Instructions below. Patient Instructions  Medication Instructions:  Your physician recommends that you continue on your current medications as directed. Please refer to the Current Medication list given to you today. - See below for instructions for medications in relation to your heart catheterization.   Labwork: Your physician recommends that you return for lab work in: TODAY (BMP, PT/INR) - Please go to the [facility]. You will check in at the front desk to the right as you walk into the atrium. Valet Parking is offered if needed.    Testing/Procedures:  You are scheduled for a Left Heart Catheterization on 11/24/16 with Dr. Tresa Endo.  1. Please arrive at the [facility] (Main Entrance A) at [facility]: [address] [place], [place] [address] at 06:30 AM (this is a few hours before your procedure to ensure your preparation). Free valet parking service is available.   Special note:  Every effort is made to have your procedure done on time. Please understand that emergencies sometimes delay scheduled procedures.  2. Diet: Do not eat or drink anything after midnight prior to your procedure except sips of water to take medications.  3. Labs: TODAY (BMP, PT/INR)  4. Medication instructions in preparation for your procedure:   A) DO NOT TAKE your Eliquis for 36 hours prior to procedure, LAST dose will be Sunday morning.   B) DO NOT TAKE your Torsemide and spironolactone the day before or morning of the procedure.  On the morning of your procedure, take your remaining medications NOT listed above.  You may use sips of water.  5. Plan for one night stay--bring personal belongings.  6. Bring a current list of your medications and current insurance cards.  7. You MUST have a responsible person to drive you home.  8. Someone MUST be with you the first 24 hours after you arrive home or your discharge will be delayed.  9. Please wear clothes that are easy to get on and off and wear slip-on shoes.  Thank you for allowing [place] to care for you!   -- [facility]  Follow-Up: Will be determined  after you have had your procedure.   Signed, Ellsworth Lennox, PA-C  11/18/2016 7:53 PM    [facility] [address] [place], [address] [place], [place]  [address] Phone: [phone]; Fax: [phone]  [address], [address] [place], [place] [address] Phone: [phone]

## 2016-11-25 ENCOUNTER — Encounter (HOSPITAL_COMMUNITY): Payer: Self-pay | Admitting: Cardiovascular Disease

## 2016-11-25 DIAGNOSIS — I25710 Atherosclerosis of autologous vein coronary artery bypass graft(s) with unstable angina pectoris: Secondary | ICD-10-CM | POA: Diagnosis not present

## 2016-11-25 DIAGNOSIS — I208 Other forms of angina pectoris: Secondary | ICD-10-CM

## 2016-11-25 DIAGNOSIS — I480 Paroxysmal atrial fibrillation: Secondary | ICD-10-CM | POA: Diagnosis not present

## 2016-11-25 DIAGNOSIS — I2582 Chronic total occlusion of coronary artery: Secondary | ICD-10-CM | POA: Diagnosis not present

## 2016-11-25 DIAGNOSIS — D500 Iron deficiency anemia secondary to blood loss (chronic): Secondary | ICD-10-CM

## 2016-11-25 DIAGNOSIS — D649 Anemia, unspecified: Secondary | ICD-10-CM

## 2016-11-25 DIAGNOSIS — I25118 Atherosclerotic heart disease of native coronary artery with other forms of angina pectoris: Secondary | ICD-10-CM | POA: Diagnosis not present

## 2016-11-25 DIAGNOSIS — I350 Nonrheumatic aortic (valve) stenosis: Secondary | ICD-10-CM | POA: Diagnosis not present

## 2016-11-25 LAB — BASIC METABOLIC PANEL
ANION GAP: 6 (ref 5–15)
BUN: 24 mg/dL — ABNORMAL HIGH (ref 6–20)
CALCIUM: 8.8 mg/dL — AB (ref 8.9–10.3)
CO2: 21 mmol/L — ABNORMAL LOW (ref 22–32)
Chloride: 113 mmol/L — ABNORMAL HIGH (ref 101–111)
Creatinine, Ser: 1.6 mg/dL — ABNORMAL HIGH (ref 0.61–1.24)
GFR calc Af Amer: 46 mL/min — ABNORMAL LOW (ref >60)
GFR, EST NON AFRICAN AMERICAN: 40 mL/min — AB (ref >60)
Glucose, Bld: 140 mg/dL — ABNORMAL HIGH (ref 65–99)
Potassium: 4 mmol/L (ref 3.5–5.1)
Sodium: 140 mmol/L (ref 135–145)

## 2016-11-25 LAB — FERRITIN: Ferritin: 12 ng/mL — ABNORMAL LOW (ref 24–336)

## 2016-11-25 LAB — CBC
HEMATOCRIT: 25.2 % — AB (ref 39.0–52.0)
Hemoglobin: 8 g/dL — ABNORMAL LOW (ref 13.0–17.0)
MCH: 28.3 pg (ref 26.0–34.0)
MCHC: 31.7 g/dL (ref 30.0–36.0)
MCV: 89 fL (ref 78.0–100.0)
PLATELETS: 199 10*3/uL (ref 150–400)
RBC: 2.83 MIL/uL — ABNORMAL LOW (ref 4.22–5.81)
RDW: 14.2 % (ref 11.5–15.5)
WBC: 9.3 10*3/uL (ref 4.0–10.5)

## 2016-11-25 MED ORDER — FERROUS SULFATE 325 (65 FE) MG PO TABS: 325.0000 mg | ORAL_TABLET | Freq: Every day | ORAL | 3 refills | Status: DC

## 2016-11-25 MED ORDER — ASPIRIN EC 81 MG PO TBEC: 81.0000 mg | DELAYED_RELEASE_TABLET | Freq: Every evening | ORAL | 0 refills | Status: DC

## 2016-11-25 MED ORDER — FERROUS SULFATE 325 (65 FE) MG PO TABS: 325.0000 mg | ORAL_TABLET | Freq: Every day | ORAL | Status: DC

## 2016-11-25 MED FILL — Verapamil HCl IV Soln 2.5 MG/ML: INTRAVENOUS | Qty: 2 | Status: AC

## 2016-11-25 MED FILL — Sodium Chloride IV Soln 0.9%: INTRAVENOUS | Qty: 250 | Status: AC

## 2016-11-25 NOTE — Discharge Summary (Signed)
Discharge Summary    Patient ID: Jorge Cherry,  MRN: [id number], DOB/AGE: 09/29/1935 81 y.o.  Admit date: 11/24/2016 Discharge date: 11/25/2016  Primary Care Provider: Chilton Greathouse Primary Cardiologist: Tresa Endo   Discharge Diagnoses    Active Problems:   Exertional angina [facility])   Essential hypertension   PAF (paroxysmal atrial fibrillation) (HCC)   Coronary artery disease with exertional angina (HCC)   Anemia   Allergies Allergies  Allergen Reactions  . Altace [Ramipril] Other (See Comments)    Mouth swelling  . Mucinex [Guaifenesin Er] Hives and Other (See Comments)    Mouth swelling  . Contrast Media [Iodinated Diagnostic Agents] Other (See Comments)    Made eyes change each time    Diagnostic Studies/Procedures    LHC: 11/24/16  Conclusion     Prox RCA to Mid RCA lesion, 100 %stenosed.  SVG.  Origin to Insertion lesion, 100 %stenosed.  Prox LAD lesion, 95 %stenosed.  Mid LAD lesion, 100 %stenosed.  SVG.  Origin to Prox Graft lesion, 5 %stenosed.  Mid Graft lesion, 40 %stenosed.  Ost 2nd Diag lesion, 100 %stenosed.  LIMA.  Ost Cx to Prox Cx lesion, 100 %stenosed.  SVG.  Prox Graft lesion, 20 %stenosed.  Hemodynamic findings consistent with mild pulmonary hypertension.  Prox Cx to Mid Cx lesion, 70 %stenosed.  2nd Diag lesion, 30 %stenosed.  Origin lesion, 20 %stenosed.   Preserved global LV function with focal mild mid anterolateral hypocontractility and an ejection fraction of 55%.  Supravalvular aortography demonstrating upper normal aortic root with mild aortic valve calcification with reduced moderately reduced excursion.  No angiographic aortic insufficiency.  Severe native CAD with 95% proximal LAD stenosis just prior to the first diagonal vessel with total occlusion of the LAD after the third septal perforating artery and before the takeoff of the second diagonal branch; total occlusion of the circumflex at the ostium; and  total proximal RCA occlusion.  Patent LIMA graft which supplies the mid LAD, but due to the total occlusion of the LAD after the septal perforating artery, the proximal LAD and diagonal vessel is not supplied by the graft.  Patent vein graft supplying the distal marginal vessel with filling retrograde of the circumflex up to the ostium with 70% mid AV groove stenosis.  There is collateral filling to the distal RCA.  Patent vein graft supplying the second diagonal vessel with 20% ostial narrowing, a patent proximal stent, and diffuse 40% mid graft stenosis.  There is 30% narrowing after the graft anastomosis.  Occluded SVG which had supplied the distal RCA.  RECOMMENDATION: [name] has developed increasing symptoms of exertional angina leading to his cardiac catheterization.  The present study only demonstrates mild AS.  Of note, admission laboratory today revealed a significant hemoglobin drop compared to 3 months ago and it is possible that his anemia with hemoglobin of 8 was contributing to his increasing anginal symptomatology.  He has a region of potential proximal LAD ischemia in the territory of the diagonal-1  Vessel due to the 95% stenosis proximally and this segment of the LAD is not supplied by the LIMA graft since the mid LAD os occluded before the graft anastomosis.  His eliquis has been on hold for several days.  Plan to increase medical therapy.  Hemoglobin/hematocrit and iron studies will be rechecked in a.m. and if further drop Dr. Madilyn Fireman will need to be contacted for follow-up GI evaluation.  (He had seen him last week).  At present, will defer  intervention to the proximal LAD territory.  At present he is not a candidate for triple drug therapy.  He is maintaining sinus rhythm and eliquis may need to be deferred until  GI evaluation is complete.   _____________   History of Present Illness     Jorge Cherry is a 81 y.o. male with past medical history of CAD (s/p CABG in 1994,  s/p PCI with DES to SVG-D1 in 2003, low-risk NST in 01/2015), chronic diastolic CHF, PAF (on Eliquis), HTN, HLD, Stage 3 CKD and mild AS (by echo in 11/2015) who presents to the office today for evaluation of worsening chest pain.   He was examined by Dr. Tresa Endo in 06/2016 and reported worsening chest pain despite his Imdur being increased from 60mg  daily to 90mg  daily in 05/2016, therefore this was further titrated to 90mg  in AM and 30mg  in PM. Creatinine was checked at that time and stable at 1.65, but it was recommended he come back for reevaluation prior to undergoing a cardiac catheterization. In 07/2016, he reported resolution of his chest pain with the recent dose adjustment of Imdur. An echo was obtained in the interim which showed an EF of 60-65% with moderate AS (mean gradient of 21 mmHg).   He was evaluated at [facility] on 08/06/2016 for lightheadedness and found to be back in rate-control atrial fibrillation with a HR of 88. At the time of his office visit on 4/9, he was back in NSR. Was continued on his current medication regimen at that time.   He called the after-hours line on 7/8 and reported having worsening chest pain and dyspnea on exertion, therefore close follow-up was arranged with anticipation of a cardiac catheterization in the near future.   He was seen in the office on 7/11/18Iand reported having episodes of chest discomfort occurring with exertion for the past month. He noted associated dyspnea with this. Denied any diaphoresis, nausea, or vomiting. This initially occurred with walking up hills but he now noticed this when walking for over 100 feet on level ground. He described this as a tightness along his left pectoral region which resolves with rest. He just refilled his spray nitroglycerin but has not had to utilize this within the past few days.  He reported good compliance with Eliquis. Did experience darker stools when consuming blackberries and says they "irritated  his diverticulosis" but these symptoms have since resolved. Hgb checked by PCP two weeks ago and at 10.6. His symptoms were discussed with Dr. Tresa Endo and decision was made to proceed forward with cardiac cath.   Hospital Course     He underwent R/LHC with Dr. Tresa Endo noted above with occluded native RCA receiving left to left collaterals, patent vein to the circumflex obtuse marginal branch as well as a patent LIMA to the LAD with a high-grade proximal LAD stenosis jeopardizing a first diagonal which is unprotected. The patient's hemoglobin was 8 which may be contributing to symptoms. Dr. Tresa Endo did not intervene secondary to concerns about the type of therapy in the setting of potential GI blood loss. He was treated medically. No further chest pain post cath. His Hgb was stable at 8 post cath, and Cr 1.60 at baseline. He was started on Iron supplement prior to discharge. Hx of PAF but maintained SR throughout admission. Given he has maintained SR, Dr. Allyson Sabal decided to hold his Eliquis in the setting of anemia. He plans to follow up with his GI MD Dr. Madilyn Fireman after discharge.  He was seen by Dr. Allyson Sabal and determined stable for discharge home. Follow up in the office has been arranged, and he was encouraged to follow up with GI within the next week. Medications are listed below.  _____________  Discharge Vitals Blood pressure (!) 148/77, pulse 73, temperature 98.1 F (36.7 C), temperature source Oral, resp. rate 18, height 5\' 8"  (1.727 m), weight 207 lb 3.7 oz (94 kg), SpO2 96 %.  Filed Weights   11/24/16 0650 11/25/16 0631  Weight: 208 lb (94.3 kg) 207 lb 3.7 oz (94 kg)    Labs & Radiologic Studies    CBC  Recent Labs  11/24/16 0736 11/25/16 0327  WBC 6.4 9.3  HGB 8.0* 8.0*  HCT 25.7* 25.2*  MCV 89.5 89.0  PLT 223 199   Basic Metabolic Panel  Recent Labs  11/25/16 0327  NA 140  K 4.0  CL 113*  CO2 21*  GLUCOSE 140*  BUN 24*  CREATININE 1.60*  CALCIUM 8.8*   Liver Function  Tests No results for input(s): AST, ALT, ALKPHOS, BILITOT, PROT, ALBUMIN in the last 72 hours. No results for input(s): LIPASE, AMYLASE in the last 72 hours. Cardiac Enzymes No results for input(s): CKTOTAL, CKMB, CKMBINDEX, TROPONINI in the last 72 hours. BNP Invalid input(s): POCBNP D-Dimer No results for input(s): DDIMER in the last 72 hours. Hemoglobin A1C No results for input(s): HGBA1C in the last 72 hours. Fasting Lipid Panel No results for input(s): CHOL, HDL, LDLCALC, TRIG, CHOLHDL, LDLDIRECT in the last 72 hours. Thyroid Function Tests No results for input(s): TSH, T4TOTAL, T3FREE, THYROIDAB in the last 72 hours.  Invalid input(s): FREET3 _____________  No results found. Disposition   Pt is being discharged home today in good condition.  Follow-up Plans & Appointments    Follow-up Information    Dorena Cookey, MD Follow up.   Specialty:  Gastroenterology Why:  Please arrange follow up within the week to discuss your low blood counts.  Contact information: [address] [place] [address] [phone]        Lennette Bihari, MD Follow up on 12/15/2016.   Specialty:  Cardiology Why:  at 9:40am for your follow up appt.  Contact information: [address] [place] [address] [phone]          Discharge Instructions    Call MD for:  redness, tenderness, or signs of infection (pain, swelling, redness, odor or green/yellow discharge around incision site)    Complete by:  As directed    Diet - low sodium heart healthy    Complete by:  As directed    Discharge instructions    Complete by:  As directed    Groin Site Care Refer to this sheet in the next few weeks. These instructions provide you with information on caring for yourself after your procedure. Your caregiver may also give you more specific instructions. Your treatment has been planned according to current medical practices, but problems sometimes occur. Call your  caregiver if you have any problems or questions after your procedure. HOME CARE INSTRUCTIONS You may shower 24 hours after the procedure. Remove the bandage (dressing) and gently wash the site with plain soap and water. Gently pat the site dry.  Do not apply powder or lotion to the site.  Do not sit in a bathtub, swimming pool, or whirlpool for 5 to 7 days.  No bending, squatting, or lifting anything over 10 pounds (4.5 kg) as directed by your caregiver.  Inspect the site at least twice daily.  Do not drive home if you are discharged the same day of the procedure. Have someone else drive you.  You may drive 24 hours after the procedure unless otherwise instructed by your caregiver.  What to expect: Any bruising will usually fade within 1 to 2 weeks.  Blood that collects in the tissue (hematoma) may be painful to the touch. It should usually decrease in size and tenderness within 1 to 2 weeks.  SEEK IMMEDIATE MEDICAL CARE IF: You have unusual pain at the groin site or down the affected leg.  You have redness, warmth, swelling, or pain at the groin site.  You have drainage (other than a small amount of blood on the dressing).  You have chills.  You have a fever or persistent symptoms for more than 72 hours.  You have a fever and your symptoms suddenly get worse.  Your leg becomes pale, cool, tingly, or numb.  You have heavy bleeding from the site. Hold pressure on the site. .  We have stopped your Eliquis this admission due to low blood counts. You need to see your GI doctor within the week to follow up with this and determine when it is safe to restart this medication. Please call the office with any questions.   Increase activity slowly    Complete by:  As directed       Discharge Medications   Current Discharge Medication List    START taking these medications   Details  ferrous sulfate 325 (65 FE) MG tablet Take 1 tablet (325 mg total) by mouth daily with breakfast. Qty: 30  tablet, Refills: 3      CONTINUE these medications which have CHANGED   Details  aspirin EC 81 MG tablet Take 1 tablet (81 mg total) by mouth every evening. Qty: 30 tablet, Refills: 0      CONTINUE these medications which have NOT CHANGED   Details  albuterol (PROAIR HFA) 108 (90 Base) MCG/ACT inhaler INHALE TWO PUFFS EVERY 4-6 HOURS AS NEEDED FOR COUGH OR WHEEZE Qty: 1 Inhaler, Refills: 1   Associated Diagnoses: Mild persistent asthma, uncomplicated    atorvastatin (LIPITOR) 20 MG tablet Take 20 mg by mouth every evening.     Azelastine-Fluticasone (DYMISTA) 137-50 MCG/ACT SUSP Use one to two sprays in each nostril once a day. Qty: 23 g, Refills: 5    Cholecalciferol (VITAMIN D-3 PO) Take 1,000 Units by mouth every evening.     ezetimibe (ZETIA) 10 MG tablet TAKE 1 TABLET (10 MG TOTAL) BY MOUTH DAILY. Qty: 90 tablet, Refills: 3    fenofibrate (TRICOR) 145 MG tablet Take 72.5 mg by mouth daily.     fluticasone (FLOVENT HFA) 110 MCG/ACT inhaler Use 3 puffs 3 times daily as directed for asthma flareup Qty: 12 g, Refills: 5    hydrALAZINE (APRESOLINE) 25 MG tablet Take 1.5 tablets (37.5 mg total) by mouth every 8 (eight) hours. Qty: 270 tablet, Refills: 3    ipratropium (ATROVENT) 0.06 % nasal spray Can use two sprays in each nostril every six hours as needed to dry up nose. Qty: 15 mL, Refills: 5    isosorbide mononitrate (IMDUR) 60 MG 24 hr tablet Take 1.5 tablets in the AM and 1/2 tablet in the PM Qty: 60 tablet, Refills: 11    loratadine (CLARITIN) 10 MG tablet Take 10 mg by mouth every evening.     metoprolol succinate (TOPROL-XL) 50 MG 24 hr tablet TAKE 1 TABLET (  50 MG TOTAL) BY MOUTH DAILY. TAKE WITH OR IMMEDIATELY FOLLOWING A MEAL. Qty: 30 tablet, Refills: 10    nitroGLYCERIN (NITROLINGUAL) 0.4 MG/SPRAY spray PLACE 1 SPRAY UNDER THE TONGUE EVERY 5 (FIVE) MINUTES X 3 DOSES AS NEEDED FOR CHEST PAIN. Qty: 12 g, Refills: 0    Omega-3 Fatty Acids (FISH OIL) 1000 MG CAPS  Take 1 capsule by mouth 2 (two) times daily.     pantoprazole (PROTONIX) 40 MG tablet TAKE 1 TABLET (40 MG TOTAL) BY MOUTH 2 (TWO) TIMES DAILY. Qty: 60 tablet, Refills: 4    spironolactone (ALDACTONE) 25 MG tablet Take 0.5 tablets (12.5 mg total) by mouth daily. Qty: 45 tablet, Refills: 2    telmisartan (MICARDIS) 80 MG tablet Take 80 mg by mouth daily.    torsemide (DEMADEX) 10 MG tablet Take 10 mg by mouth daily.     vitamin C (ASCORBIC ACID) 500 MG tablet Take 500 mg by mouth daily.    vitamin E 400 UNIT capsule Take 400 Units by mouth daily.    zinc gluconate 50 MG tablet Take 50 mg by mouth 2 (two) times a week.      STOP taking these medications     apixaban (ELIQUIS) 5 MG TABS tablet          Outstanding Labs/Studies   Follow up GI.   Duration of Discharge Encounter   Greater than 30 minutes including physician time.  Signed, Laverda Page NP-C 11/25/2016, 12:32 PM

## 2016-11-25 NOTE — Care Management Note (Signed)
Case Management Note  Patient Details  Name: Jorge Cherry MRN: [id number] Date of Birth: 09/26/1935  Subjective/Objective:    From home with wife, s/p heart cath, has already been on eliquis.                  Action/Plan: NCM will follow for dc needs.   Expected Discharge Date:                  Expected Discharge Plan:  Home/Self Care  In-House Referral:     Discharge planning Services  CM Consult  Post Acute Care Choice:    Choice offered to:     DME Arranged:    DME Agency:     HH Arranged:    HH Agency:     Status of Service:  Completed, signed off  If discussed at [employer] Length of Stay Meetings, dates discussed:    Additional Comments:  Leone Havenaylor, Suzi Hernan Clinton, RN 11/25/2016, 9:01 AM

## 2016-11-25 NOTE — Progress Notes (Signed)
Progress Note  Patient Name: Jorge Cherry Date of Encounter: 11/25/2016  Primary Cardiologist: Tresa EndoKelly  Subjective   Postoperatively one left and right heart cath by Dr. Tresa EndoKelly for accelerated angina in the setting of known CAD status post coronary artery bypass grafting remotely with moderate aortic stenosis by recent 2-D echo. His groin is stable. He's had no symptoms during his hospitalization. His labs are stable as well.  Inpatient Medications    Scheduled Meds: . apixaban  5 mg Oral BID  . aspirin EC  81 mg Oral Daily  . hydrALAZINE  37.5 mg Oral Q8H  . isosorbide mononitrate  30 mg Oral QHS  . isosorbide mononitrate  90 mg Oral Daily  . metoprolol succinate  25 mg Oral QHS  . metoprolol succinate  50 mg Oral Daily  . pantoprazole  40 mg Oral BID  . sodium chloride flush  3 mL Intravenous Q12H  . spironolactone  12.5 mg Oral Daily  . torsemide  10 mg Oral Daily   Continuous Infusions: . sodium chloride 75 mL/hr at 11/25/16 0043  . sodium chloride     PRN Meds: sodium chloride, acetaminophen, diazepam, hydrALAZINE, ondansetron (ZOFRAN) IV, sodium chloride flush   Vital Signs    Vitals:   11/25/16 0030 11/25/16 0631 11/25/16 0632 11/25/16 0800  BP: (!) 126/50 134/67 134/67 (!) 148/77  Pulse: 66 70  73  Resp: 20 19  18   Temp:  98.7 F (37.1 C)  98.1 F (36.7 C)  TempSrc:  Oral  Oral  SpO2: 94% 97%  96%  Weight:  207 lb 3.7 oz (94 kg)    Height:        Intake/Output Summary (Last 24 hours) at 11/25/16 1007 Last data filed at 11/25/16 0700  Gross per 24 hour  Intake              600 ml  Output             2100 ml  Net            -1500 ml   Filed Weights   11/24/16 0650 11/25/16 0631  Weight: 208 lb (94.3 kg) 207 lb 3.7 oz (94 kg)    Telemetry    Normal sinus rhythm in the 60s - Personally Reviewed  ECG    Normal sinus rhythm at 67 with nonspecific ST and T-wave changes. - Personally Reviewed  Physical Exam   GEN: No acute distress.   Neck: No  JVD Cardiac: RRR, 2/6 outflow tract murmur consistent with aortic stenosis, rubs, or gallops.  Respiratory: Clear to auscultation bilaterally. GI: Soft, nontender, non-distended  MS: No edema; No deformity. Neuro:  Nonfocal  Psych: Normal affect  Extremities: Right femoral puncture site well healed with mild surrounding ecchymosis   Labs    Chemistry Recent Labs Lab 11/18/16 1651 11/25/16 0327  NA 139 140  K 4.3 4.0  CL 109 113*  CO2 24 21*  GLUCOSE 95 140*  BUN 25* 24*  CREATININE 1.55* 1.60*  CALCIUM 8.9 8.8*  GFRNONAA 41* 40*  GFRAA 48* 46*  ANIONGAP 6 6     Hematology Recent Labs Lab 11/24/16 0736 11/25/16 0327  WBC 6.4 9.3  RBC 2.87* 2.83*  HGB 8.0* 8.0*  HCT 25.7* 25.2*  MCV 89.5 89.0  MCH 27.9 28.3  MCHC 31.1 31.7  RDW 13.8 14.2  PLT 223 199    Cardiac EnzymesNo results for input(s): TROPONINI in the last 168 hours. No results for input(s):  TROPIPOC in the last 168 hours.   BNPNo results for input(s): BNP, PROBNP in the last 168 hours.   DDimer No results for input(s): DDIMER in the last 168 hours.   Radiology    No results found.  Cardiac Studies   Cardiac catheterization: (11/24/16)   Conclusion     Prox RCA to Mid RCA lesion, 100 %stenosed.  SVG.  Origin to Insertion lesion, 100 %stenosed.  Prox LAD lesion, 95 %stenosed.  Mid LAD lesion, 100 %stenosed.  SVG.  Origin to Prox Graft lesion, 5 %stenosed.  Mid Graft lesion, 40 %stenosed.  Ost 2nd Diag lesion, 100 %stenosed.  LIMA.  Ost Cx to Prox Cx lesion, 100 %stenosed.  SVG.  Prox Graft lesion, 20 %stenosed.  Hemodynamic findings consistent with mild pulmonary hypertension.  Prox Cx to Mid Cx lesion, 70 %stenosed.  2nd Diag lesion, 30 %stenosed.  Origin lesion, 20 %stenosed.   Preserved global LV function with focal mild mid anterolateral hypocontractility and an ejection fraction of 55%.  Supravalvular aortography demonstrating upper normal aortic root with  mild aortic valve calcification with reduced moderately reduced excursion.  No angiographic aortic insufficiency.  Severe native CAD with 95% proximal LAD stenosis just prior to the first diagonal vessel with total occlusion of the LAD after the third septal perforating artery and before the takeoff of the second diagonal branch; total occlusion of the circumflex at the ostium; and total proximal RCA occlusion.  Patent LIMA graft which supplies the mid LAD, but due to the total occlusion of the LAD after the septal perforating artery, the proximal LAD and diagonal vessel is not supplied by the graft.  Patent vein graft supplying the distal marginal vessel with filling retrograde of the circumflex up to the ostium with 70% mid AV groove stenosis.  There is collateral filling to the distal RCA.  Patent vein graft supplying the second diagonal vessel with 20% ostial narrowing, a patent proximal stent, and diffuse 40% mid graft stenosis.  There is 30% narrowing after the graft anastomosis.  Occluded SVG which had supplied the distal RCA.  RECOMMENDATION: Jorge Cherry has developed increasing symptoms of exertional angina leading to his cardiac catheterization.  The present study only demonstrates mild AS.  Of note, admission laboratory today revealed a significant hemoglobin drop compared to 3 months ago and it is possible that his anemia with hemoglobin of 8 was contributing to his increasing anginal symptomatology.  He has a region of potential proximal LAD ischemia in the territory of the diagonal-1  Vessel due to the 95% stenosis proximally and this segment of the LAD is not supplied by the LIMA graft since the mid LAD os occluded before the graft anastomosis.  His eliquis has been on hold for several days.  Plan to increase medical therapy.  Hemoglobin/hematocrit and iron studies will be rechecked in a.m. and if further drop Dr. Madilyn Fireman will need to be contacted for follow-up GI evaluation.  (He had  seen him last week).  At present, will defer intervention to the proximal LAD territory.  At present he is not a candidate for triple drug therapy.  He is maintaining sinus rhythm and eliquis may need to be deferred until  GI evaluation is complete.     Patient Profile     81 y.o. male with history of CAD status post remote coronary artery bypass grafting with mild aortic stenosis by right heart cath in the setting of accelerated angina/shortness of breath. His hemoglobin is 84 points from several  months ago. He was on Eliquis  for PAF. His RCA graft is occluded with left-to-right collaterals.Patent although he did have unprotected first diagonal branch secondary to a high-grade calcified proximal LAD stenosis. This was not intervened on because Dr. Tresa Endo did not want the patient to be placed on dual antiplatelet therapy given his low hemoglobin of unknown etiology. He is stable for discharge today with early outpatient GI workup and follow-up.  Assessment & Plan    1: Coronary artery disease-postop day one right and left heart cath by Dr. Tresa Endo revealing an occluded native RCA receiving left to left collaterals, patent vein to the circumflex obtuse marginal branch as well as a patent LIMA to the LAD with a high-grade proximal LAD stenosis jeopardizing a first diagonal which is unprotected. The patient's hemoglobin is 8 which may be contributing to symptoms. Dr. Tresa Endo did not intervene secondary to concerns about the type of therapy in the setting of potential GI blood loss. The patient is stable for discharge home today.  2: Aortic stenosis-the patient's 2-D echo did suggest moderate aortic stenosis however his right heart cath only supports the diagnosis of mild aortic stenosis.  3: Paroxysmal atrial fibrillation: Maintaining sinus rhythm. In light of his GI blood loss we will hold his Eliquis until the etiology is determined.  4: Anemia-hemoglobin was 8 today. It was in the 12 range several months  ago. He does have a history of diverticulosis and is followed by Dr. Madilyn Fireman, gastroenterologist, as an outpatient. We will hold off on restarting his Eliquis  until his GI status is further evaluated. He is maintaining sinus rhythm. I am going to put him on iron repletion therapy.  Patient okay for discharge on iron repletion therapy. We will hold his Eliqui suntil he is evaluated by gastroenterology. Return office visit with Dr. Tresa Endo.  Alphonsus Sias, MD  11/25/2016, 10:07 AM

## 2016-11-27 ENCOUNTER — Telehealth: Payer: Self-pay | Admitting: Cardiovascular Disease

## 2016-11-27 NOTE — Telephone Encounter (Signed)
I discussed with patient at time of his cardiac catheterization

## 2016-11-27 NOTE — Telephone Encounter (Signed)
°  New Prob  Pt c/o BP issue: STAT if pt c/o blurred vision, one-sided weakness or slurred speech  1. What are your last 5 BP readings? 105/61- Few minutes ago 115/71- Yesterday  2. Are you having any other symptoms (ex. Dizziness, headache, blurred vision, passed out)? Dizziness  3. What is your BP issue?  States his blood pressure has been lower than normal

## 2016-11-27 NOTE — Telephone Encounter (Signed)
Returned call to patient no answer.LMTC. 

## 2016-12-01 NOTE — Telephone Encounter (Signed)
Lm2cb 

## 2016-12-01 NOTE — Telephone Encounter (Signed)
LmvmNot able to get a hold of pt and has not called back will close message and await return call

## 2016-12-02 ENCOUNTER — Telehealth: Payer: Self-pay | Admitting: Cardiovascular Disease

## 2016-12-02 DIAGNOSIS — Z79899 Other long term (current) drug therapy: Secondary | ICD-10-CM

## 2016-12-02 NOTE — Telephone Encounter (Signed)
Agree with increase Fe; will need lab prior to ov

## 2016-12-02 NOTE — Telephone Encounter (Signed)
Patient's wife walked in. She had a question for Dr. Tresa EndoKelly regarding patient's most recent labs (from Dr. Felipa EthAvva) and patient's Fe dose. Patient had cath w/Dr. Tresa EndoKelly and needs a stent, but she states MD didn't do stent b/c Hgb was 8.0 in hospital. Patient was started on Fe supplement QD and his Hgb is now 8.9 as of 7/23 per labs (wife provided us a copy). Patient/wife were verbally told that his stool test did not show blood either. Patient saw Dr. Madilyn FiremanHayes w/[facility] today and he recommended Fe BID after review of most recent labs. Advised if Dr. Madilyn FiremanHayes recommended an increase dose of Fe based on labs, then this change can be made since he is a doctor as well - advised I wouldn't think Dr. Tresa EndoKelly would disagree with this recommendation. She states her husband is anxious to have his stent as he has chest pain with exertion. She also reports his BP has been OK 130/88 and 136/80. Advised would send message to MD and Burna MortimerWanda to follow up on.   Note added to appt for 8/7 to ensure that labs & notes from Dr. Felipa EthAvva & Dr. Madilyn FiremanHayes are available (from chart prep) for this appointment

## 2016-12-03 NOTE — Telephone Encounter (Signed)
Called patient/wife. Informed them of MD recommendations to increase Fe to BID and have labs (CBC ordered). Patient/wife would like to be added to cancellation list for Dr. Tresa EndoKelly - staff message sent to scheduler pool   Patient expressed continued concern regarding exercise limitations d/t angina from blockage

## 2016-12-10 ENCOUNTER — Telehealth: Payer: Self-pay | Admitting: Cardiovascular Disease

## 2016-12-10 NOTE — Telephone Encounter (Signed)
F/U call:  Patient wife calling back in regards to having labwork completed. She states that she was told patient needed to have labs done but has not received any info.

## 2016-12-10 NOTE — Telephone Encounter (Signed)
Harrold Donathathan see 7/26 phone message with Eileen StanfordJenna. Looks like he needed CBC with was apparently ordered..[name]

## 2016-12-11 NOTE — Telephone Encounter (Signed)
Called and Dewayne Hatchnn voiced labwork was drawn today. Results are pending, she is aware we will call once results posted.

## 2016-12-12 LAB — CBC
Hematocrit: 36.3 % — ABNORMAL LOW (ref 37.5–51.0)
Hemoglobin: 11.1 g/dL — ABNORMAL LOW (ref 13.0–17.7)
MCH: 28.6 pg (ref 26.6–33.0)
MCHC: 30.6 g/dL — AB (ref 31.5–35.7)
MCV: 94 fL (ref 79–97)
PLATELETS: 236 10*3/uL (ref 150–379)
RBC: 3.88 x10E6/uL — ABNORMAL LOW (ref 4.14–5.80)
RDW: 16.8 % — AB (ref 12.3–15.4)
WBC: 5.8 10*3/uL (ref 3.4–10.8)

## 2016-12-15 ENCOUNTER — Encounter: Payer: Self-pay | Admitting: Cardiovascular Disease

## 2016-12-15 ENCOUNTER — Ambulatory Visit (INDEPENDENT_AMBULATORY_CARE_PROVIDER_SITE_OTHER): Payer: Medicare Other | Admitting: Cardiovascular Disease

## 2016-12-15 VITALS — BP 130/68 | HR 59 | Ht 68.0 in | Wt 207.0 lb

## 2016-12-15 DIAGNOSIS — I350 Nonrheumatic aortic (valve) stenosis: Secondary | ICD-10-CM | POA: Diagnosis not present

## 2016-12-15 DIAGNOSIS — I25110 Atherosclerotic heart disease of native coronary artery with unstable angina pectoris: Secondary | ICD-10-CM | POA: Diagnosis not present

## 2016-12-15 DIAGNOSIS — I480 Paroxysmal atrial fibrillation: Secondary | ICD-10-CM

## 2016-12-15 DIAGNOSIS — N183 Chronic kidney disease, stage 3 unspecified: Secondary | ICD-10-CM

## 2016-12-15 DIAGNOSIS — Z79899 Other long term (current) drug therapy: Secondary | ICD-10-CM

## 2016-12-15 DIAGNOSIS — I5032 Chronic diastolic (congestive) heart failure: Secondary | ICD-10-CM | POA: Diagnosis not present

## 2016-12-15 DIAGNOSIS — D508 Other iron deficiency anemias: Secondary | ICD-10-CM | POA: Diagnosis not present

## 2016-12-15 LAB — BASIC METABOLIC PANEL
BUN/Creatinine Ratio: 14 (ref 10–24)
BUN: 23 mg/dL (ref 8–27)
CALCIUM: 9.2 mg/dL (ref 8.6–10.2)
CHLORIDE: 108 mmol/L — AB (ref 96–106)
CO2: 19 mmol/L — AB (ref 20–29)
Creatinine, Ser: 1.59 mg/dL — ABNORMAL HIGH (ref 0.76–1.27)
GFR calc Af Amer: 47 mL/min/{1.73_m2} — ABNORMAL LOW (ref >59)
GFR calc non Af Amer: 41 mL/min/{1.73_m2} — ABNORMAL LOW (ref >59)
GLUCOSE: 106 mg/dL — AB (ref 65–99)
POTASSIUM: 4.7 mmol/L (ref 3.5–5.2)
SODIUM: 140 mmol/L (ref 134–144)

## 2016-12-15 MED ORDER — AMLODIPINE BESYLATE 5 MG PO TABS: 5.0000 mg | ORAL_TABLET | Freq: Every day | ORAL | 3 refills | Status: DC

## 2016-12-15 MED ORDER — FERROUS SULFATE 325 (65 FE) MG PO TABS: 325.0000 mg | ORAL_TABLET | Freq: Two times a day (BID) | ORAL | 3 refills | Status: DC

## 2016-12-15 MED ORDER — CLOPIDOGREL BISULFATE 75 MG PO TABS: 75.0000 mg | ORAL_TABLET | Freq: Every day | ORAL | 3 refills | Status: DC
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
## 2016-12-15 NOTE — Patient Instructions (Signed)
Medication Instructions:  START Plavix 75mg  (1 tablet) daily  START amlodipine 5 mg (1 tablet) daily  Labwork: Please have lab work TODAY (BMET)  RETURN in 4 weeks for repeat lab work (CMET, CBC).    Testing/Procedures: NONE  Follow-Up: Your physician recommends that you schedule a follow-up appointment in: 4 WEEKS with Dr. Tresa EndoKelly.    Any Other Special Instructions Will Be Listed Below (If Applicable).     If you need a refill on your cardiac medications before your next appointment, please call your pharmacy.

## 2016-12-16 ENCOUNTER — Telehealth: Payer: Self-pay | Admitting: *Deleted

## 2016-12-16 NOTE — Telephone Encounter (Signed)
-----   Message from Lennette Biharihomas A Kelly, MD sent at 12/16/2016  1:18 PM EDT ----- Renal function stable

## 2016-12-16 NOTE — Telephone Encounter (Signed)
Left lab results on patient's voice mail. ( ok per DPR)

## 2016-12-19 ENCOUNTER — Other Ambulatory Visit: Payer: Self-pay | Admitting: Allergy and Immunology

## 2017-01-01 IMAGING — US US RENAL
1 series · 14 of 25 positions shown · non-contrast
Comparison: None.

CLINICAL DATA: Acute kidney injury.  Chronic kidney disease.

EXAM:
RENAL / URINARY TRACT ULTRASOUND COMPLETE

[Series 1: us renal · 0.26mm/px · 14 of 42 slices shown]
[im 1/42]
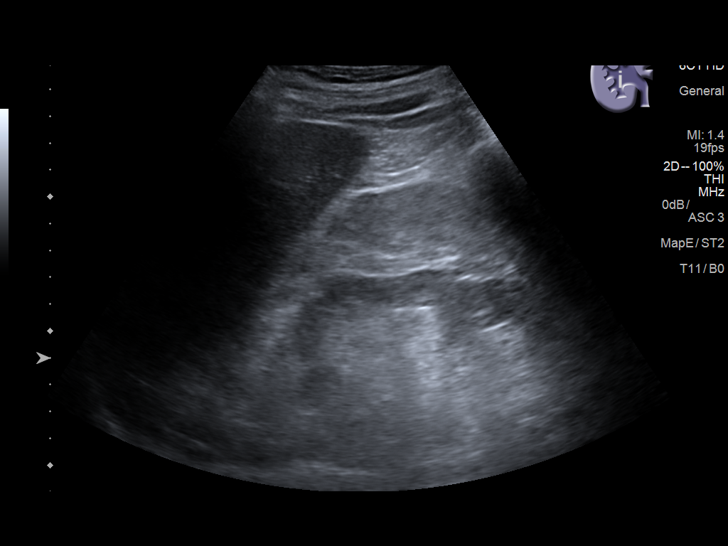
[im 4/42]
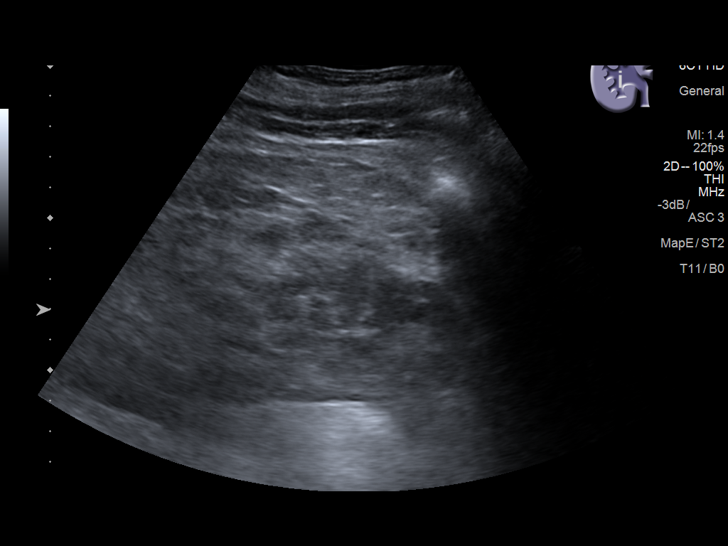
[im 7/42]
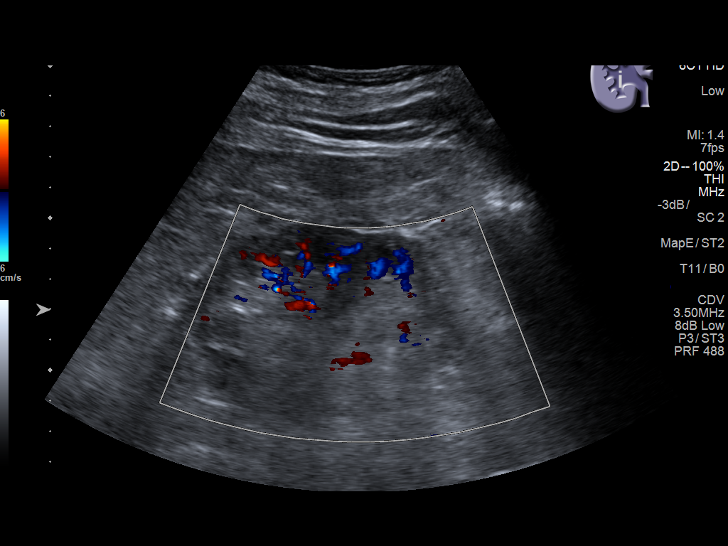
[im 11/42]
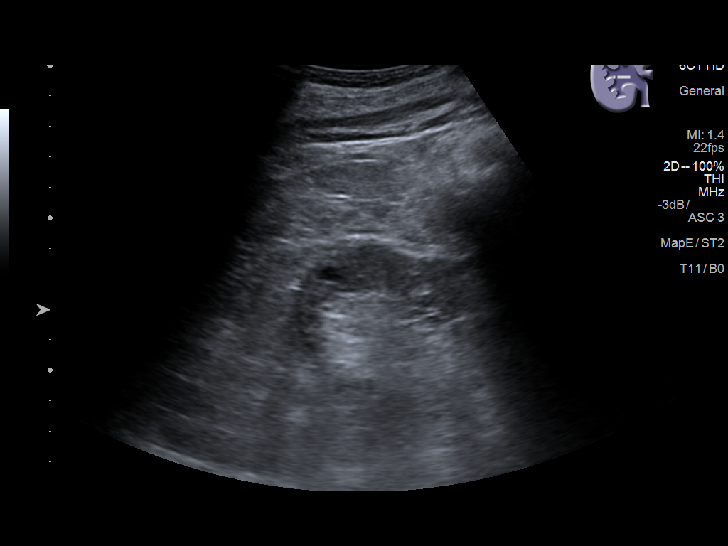
[im 14/42]
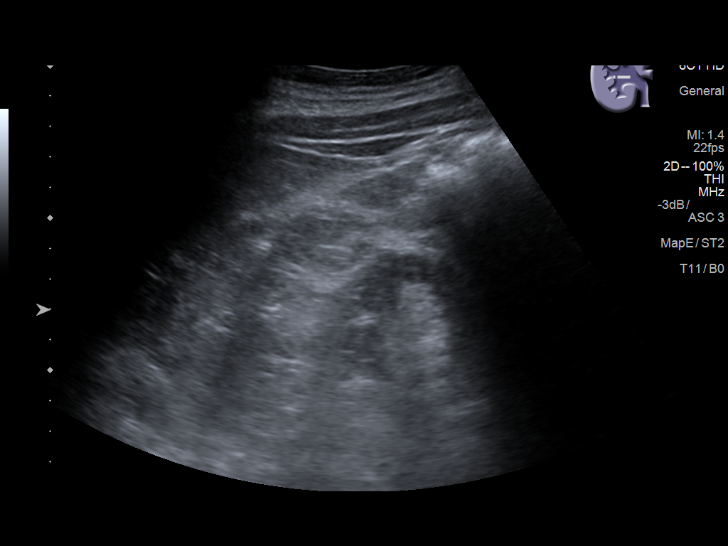
[im 16/42]
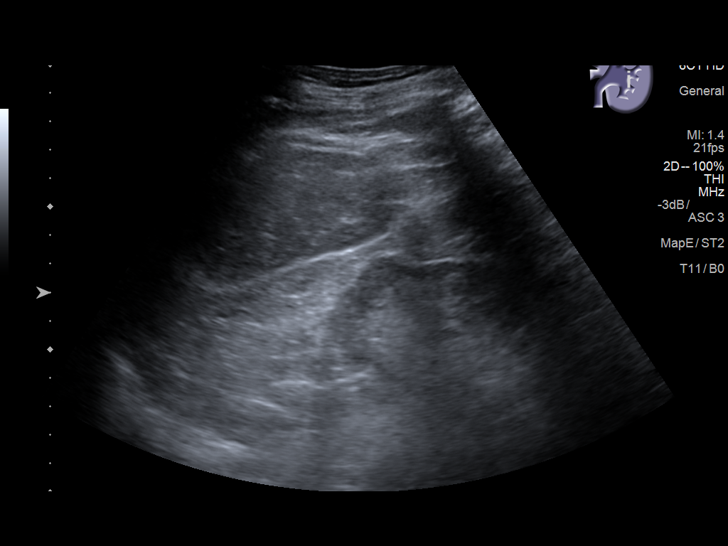
[im 19/42]
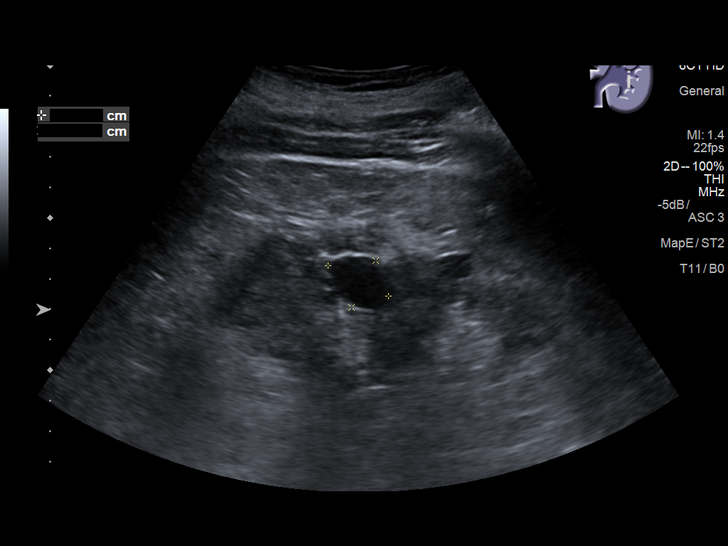
[im 23/42]
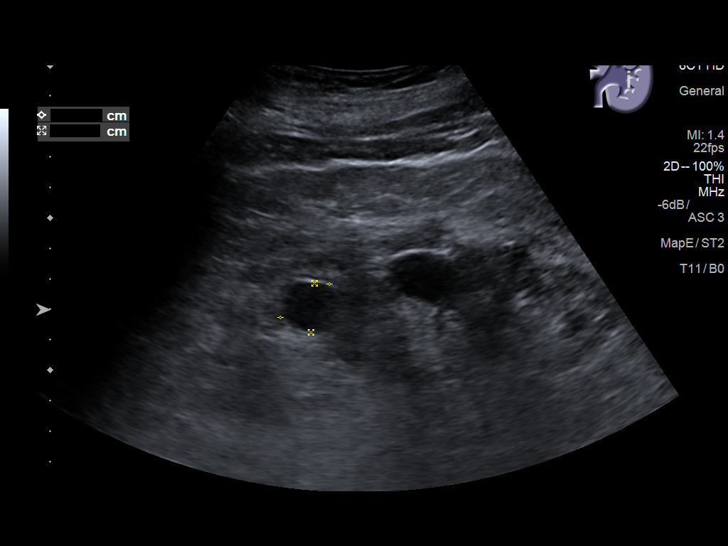
[im 26/42]
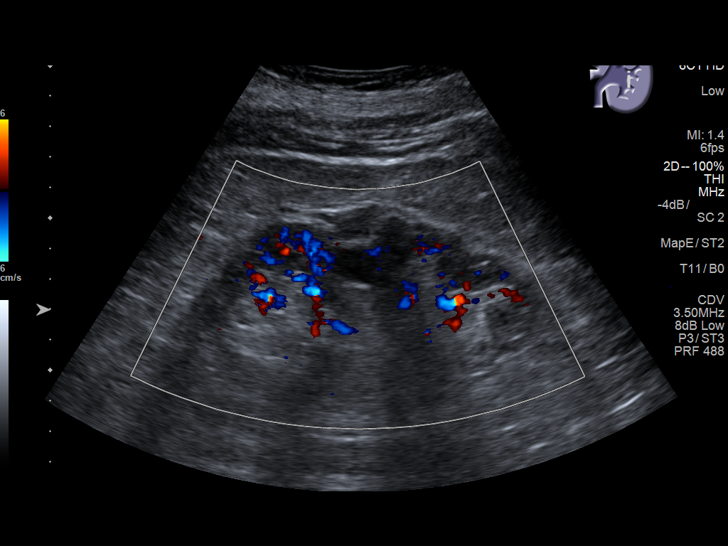
[im 28/42]
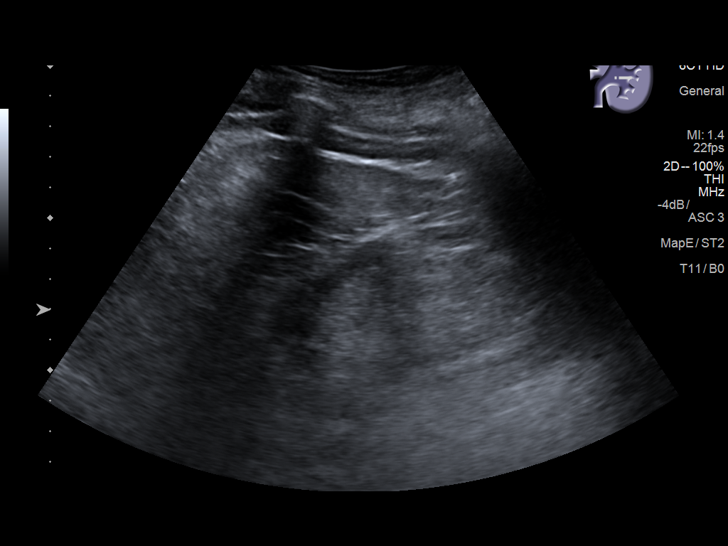
[im 31/42]
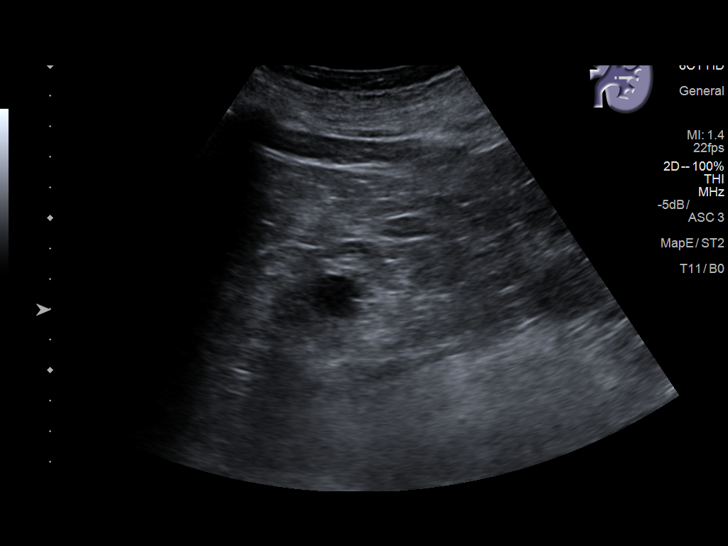
[im 35/42]
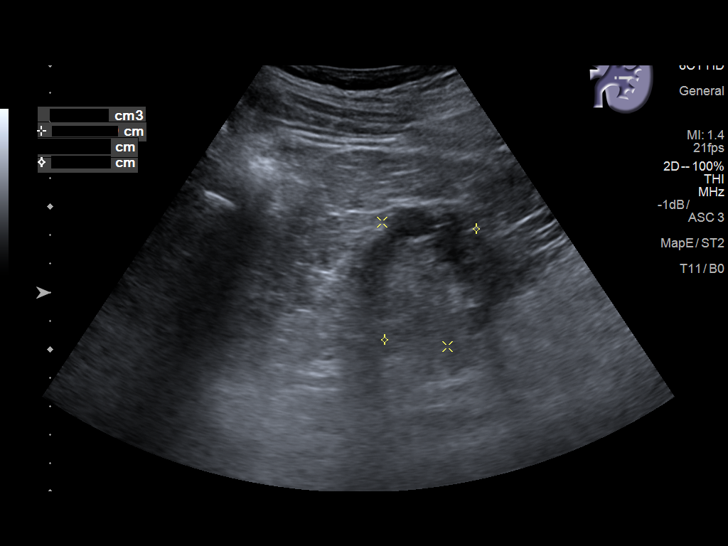
[im 38/42]
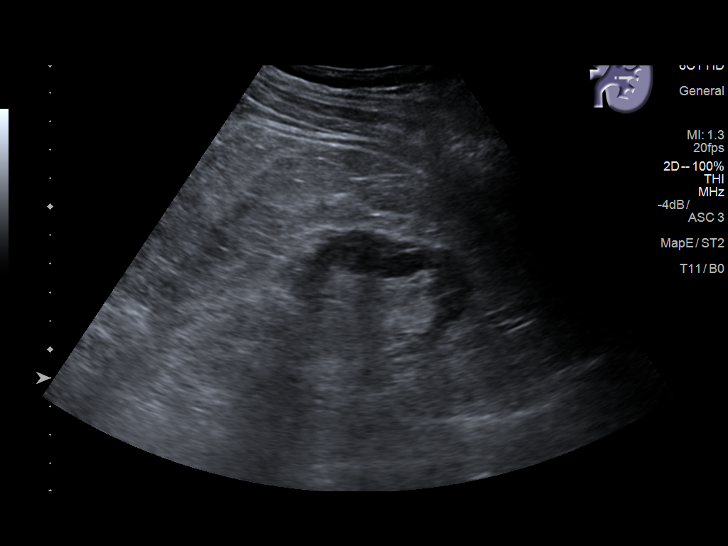
[im 42/42]
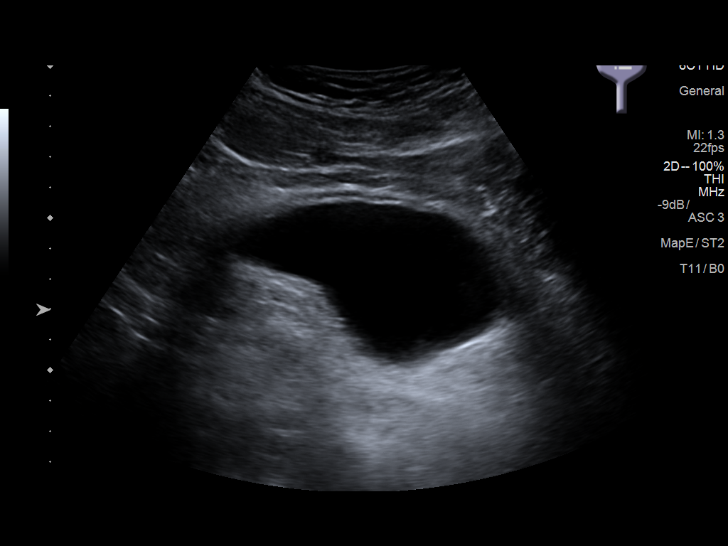

[14 of 25 positions shown; findings below may reference images not displayed]

FINDINGS: Right Kidney:

Renal measurements: 10.6 x 5 x 6.5 cm = volume: 177 mL. No renal
mass. No hydronephrosis. Renal cortical thinning. Increased renal
cortical echogenicity.

Left Kidney:

Renal measurements: 11.7 x 5 x 4.9 cm = volume: 152 mL. Two anechoic
left renal masses measuring 2.2 x 1.7 x 2.3 cm and 2 x 1.6 x 1.6 cm
respectively with increased through transmission most consistent
with cysts. No hydronephrosis. Renal cortical thinning. Increased
renal cortical echogenicity.

Bladder:

Appears normal for degree of bladder distention.
IMPRESSION: 1. Increased renal cortical echogenicity and renal cortical thinning
bilaterally as can be seen with chronic medical renal disease.
2. No obstructive uropathy.

## 2017-01-05 ENCOUNTER — Other Ambulatory Visit: Payer: Self-pay | Admitting: Cardiovascular Disease

## 2017-01-05 MED ORDER — TORSEMIDE 20 MG PO TABS: 10.0000 mg | ORAL_TABLET | Freq: Every day | ORAL | 1 refills | Status: DC

## 2017-01-05 NOTE — Telephone Encounter (Signed)
Called patient's wife to clarify torsemide dose. Patient has 20mg  tabs and take 1/2 tab PO QD.Rx(s) sent to pharmacy electronically.  Patient/wife would like to be seen sooner than 9/18. Staff message sent to scheduling pool (cc: H.Sharpe, RN) to add to cancellation list.

## 2017-01-05 NOTE — Telephone Encounter (Signed)
°*  STAT* If patient is at the pharmacy, call can be transferred to refill team.   1. Which medications need to be refilled? (please list name of each medication and dose if known) Torsemide 20 mg   2. Which pharmacy/location (including street and city if local pharmacy) is medication to be sent to? [facility] [address] [place], [place]  3. Do they need a 30 day or 90 day supply? 90

## 2017-01-26 ENCOUNTER — Ambulatory Visit: Payer: Medicare Other | Admitting: Cardiovascular Disease

## 2017-01-29 ENCOUNTER — Ambulatory Visit (INDEPENDENT_AMBULATORY_CARE_PROVIDER_SITE_OTHER): Payer: Medicare Other | Admitting: Cardiovascular Disease

## 2017-01-29 ENCOUNTER — Encounter: Payer: Self-pay | Admitting: Cardiovascular Disease

## 2017-01-29 VITALS — BP 128/80 | HR 56 | Ht 68.0 in | Wt 205.0 lb

## 2017-01-29 DIAGNOSIS — I25110 Atherosclerotic heart disease of native coronary artery with unstable angina pectoris: Secondary | ICD-10-CM

## 2017-01-29 DIAGNOSIS — I480 Paroxysmal atrial fibrillation: Secondary | ICD-10-CM

## 2017-01-29 DIAGNOSIS — Z5181 Encounter for therapeutic drug level monitoring: Secondary | ICD-10-CM | POA: Diagnosis not present

## 2017-01-29 DIAGNOSIS — Z7901 Long term (current) use of anticoagulants: Secondary | ICD-10-CM

## 2017-01-29 DIAGNOSIS — D649 Anemia, unspecified: Secondary | ICD-10-CM

## 2017-01-29 DIAGNOSIS — I350 Nonrheumatic aortic (valve) stenosis: Secondary | ICD-10-CM | POA: Diagnosis not present

## 2017-01-29 MED ORDER — RANOLAZINE ER 500 MG PO TB12: 500.0000 mg | ORAL_TABLET | Freq: Two times a day (BID) | ORAL | 3 refills | Status: DC

## 2017-01-29 MED ORDER — APIXABAN 5 MG PO TABS: 5.0000 mg | ORAL_TABLET | Freq: Two times a day (BID) | ORAL | 3 refills | Status: DC
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
## 2017-01-29 NOTE — Patient Instructions (Signed)
Medication Instructions:  STOP Plavix  START Eliquis 5 mg two times daily-samples provided  START Ranexa  two times daily  Continue Aspirin  daily  Labwork: Return for labs in 2 WEEKS (CBC, CMET).  You do not have to be fasting for these labs.  Our lab hours are M-F 8:30-4:30, closed for lunch 12:30-2, no appointment needed.  Follow-Up: Your physician recommends that you schedule a follow-up appointment in: 4 weeks with Dr. Tresa Endo.    Any Other Special Instructions Will Be Listed Below (If Applicable).     If you need a refill on your cardiac medications before your next appointment, please call your pharmacy.

## 2017-02-17 ENCOUNTER — Ambulatory Visit (INDEPENDENT_AMBULATORY_CARE_PROVIDER_SITE_OTHER): Payer: Medicare Other | Admitting: Cardiovascular Disease

## 2017-02-17 ENCOUNTER — Encounter: Payer: Self-pay | Admitting: Cardiovascular Disease

## 2017-02-17 VITALS — BP 162/76 | HR 72 | Ht 69.0 in | Wt 204.6 lb

## 2017-02-17 DIAGNOSIS — I4819 Other persistent atrial fibrillation: Secondary | ICD-10-CM

## 2017-02-17 DIAGNOSIS — I481 Persistent atrial fibrillation: Secondary | ICD-10-CM

## 2017-02-17 DIAGNOSIS — I350 Nonrheumatic aortic (valve) stenosis: Secondary | ICD-10-CM | POA: Diagnosis not present

## 2017-02-17 DIAGNOSIS — N183 Chronic kidney disease, stage 3 unspecified: Secondary | ICD-10-CM

## 2017-02-17 DIAGNOSIS — Z7901 Long term (current) use of anticoagulants: Secondary | ICD-10-CM

## 2017-02-17 DIAGNOSIS — E785 Hyperlipidemia, unspecified: Secondary | ICD-10-CM | POA: Diagnosis not present

## 2017-02-17 DIAGNOSIS — I25110 Atherosclerotic heart disease of native coronary artery with unstable angina pectoris: Secondary | ICD-10-CM

## 2017-02-17 MED ORDER — METOPROLOL SUCCINATE ER 50 MG PO TB24: 50.0000 mg | ORAL_TABLET | Freq: Two times a day (BID) | ORAL | 3 refills | Status: DC

## 2017-02-17 MED ORDER — TORSEMIDE 20 MG PO TABS: 20.0000 mg | ORAL_TABLET | Freq: Every day | ORAL | 1 refills | Status: DC

## 2017-02-17 MED ORDER — RANOLAZINE ER 1000 MG PO TB12: 1000.0000 mg | ORAL_TABLET | Freq: Two times a day (BID) | ORAL | 3 refills | Status: DC

## 2017-02-17 NOTE — Patient Instructions (Signed)
Medication Instructions:  INCREASE metoprolol (Toprol XL) to 50 mg two times daily  INCREASE Ranexa to 1000 mg two times daily  INCREASE torsemide (Demadex) to 20 mg daily  Labwork: Please return for labs (CMET, CBC)  Our in office lab hours are Monday-Friday 8:00-4:30, closed for lunch 1-2 pm.  No appointment needed.   Follow-Up: Your physician recommends that you schedule a follow-up appointment in: 4 WEEKS with Dr. Tresa Endo.   Any Other Special Instructions Will Be Listed Below (If Applicable).     If you need a refill on your cardiac medications before your next appointment, please call your pharmacy.
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
## 2017-02-18 LAB — COMPREHENSIVE METABOLIC PANEL
A/G RATIO: 1.9 (ref 1.2–2.2)
ALK PHOS: 36 IU/L — AB (ref 39–117)
ALT: 16 IU/L (ref 0–44)
AST: 23 IU/L (ref 0–40)
Albumin: 4.2 g/dL (ref 3.5–4.8)
BUN/Creatinine Ratio: 15 (ref 10–24)
BUN: 28 mg/dL — ABNORMAL HIGH (ref 8–27)
Bilirubin Total: 0.2 mg/dL (ref 0.0–1.2)
CALCIUM: 9.6 mg/dL (ref 8.6–10.2)
CO2: 18 mmol/L — AB (ref 20–29)
CREATININE: 1.86 mg/dL — AB (ref 0.76–1.27)
Chloride: 108 mmol/L — ABNORMAL HIGH (ref 96–106)
GFR, EST AFRICAN AMERICAN: 39 mL/min/{1.73_m2} — AB (ref >59)
GFR, EST NON AFRICAN AMERICAN: 34 mL/min/{1.73_m2} — AB (ref >59)
Globulin, Total: 2.2 g/dL (ref 1.5–4.5)
Glucose: 119 mg/dL — ABNORMAL HIGH (ref 65–99)
POTASSIUM: 4.5 mmol/L (ref 3.5–5.2)
SODIUM: 142 mmol/L (ref 134–144)
Total Protein: 6.4 g/dL (ref 6.0–8.5)

## 2017-02-18 LAB — CBC
Hematocrit: 37.2 % — ABNORMAL LOW (ref 37.5–51.0)
Hemoglobin: 11.9 g/dL — ABNORMAL LOW (ref 13.0–17.7)
MCH: 28.3 pg (ref 26.6–33.0)
MCHC: 32 g/dL (ref 31.5–35.7)
MCV: 89 fL (ref 79–97)
PLATELETS: 220 10*3/uL (ref 150–379)
RBC: 4.2 x10E6/uL (ref 4.14–5.80)
RDW: 15.5 % — ABNORMAL HIGH (ref 12.3–15.4)
WBC: 5.4 10*3/uL (ref 3.4–10.8)

## 2017-02-26 ENCOUNTER — Telehealth: Payer: Self-pay | Admitting: *Deleted

## 2017-02-26 ENCOUNTER — Ambulatory Visit: Payer: Medicare Other | Admitting: Cardiovascular Disease

## 2017-02-26 DIAGNOSIS — Z79899 Other long term (current) drug therapy: Secondary | ICD-10-CM

## 2017-02-26 NOTE — Telephone Encounter (Signed)
Patients torsemide increased to 20 mg daily at last OV- will verify with Dr. Tresa EndoKelly.

## 2017-02-26 NOTE — Telephone Encounter (Signed)
-----   Message from Lennette Biharihomas A Kelly, MD sent at 02/21/2017  5:40 PM EDT ----- Cr inc to 1.86; not on diuretic; stay hydrated

## 2017-02-26 NOTE — Telephone Encounter (Signed)
Spoke to Dr. Tresa EndoKelly, no change in medication at this time.  Repeat BMET in 1-2 weeks.    Spoke to patient and wife-aware and verbalized understanding.

## 2017-03-05 ENCOUNTER — Telehealth: Payer: Self-pay | Admitting: Cardiovascular Disease

## 2017-03-05 DIAGNOSIS — Z79899 Other long term (current) drug therapy: Secondary | ICD-10-CM

## 2017-03-05 LAB — BASIC METABOLIC PANEL
BUN/Creatinine Ratio: 18 (ref 10–24)
BUN: 38 mg/dL — ABNORMAL HIGH (ref 8–27)
CO2: 19 mmol/L — AB (ref 20–29)
CREATININE: 2.08 mg/dL — AB (ref 0.76–1.27)
Calcium: 9.3 mg/dL (ref 8.6–10.2)
Chloride: 106 mmol/L (ref 96–106)
GFR calc Af Amer: 34 mL/min/{1.73_m2} — ABNORMAL LOW (ref >59)
GFR, EST NON AFRICAN AMERICAN: 30 mL/min/{1.73_m2} — AB (ref >59)
Glucose: 112 mg/dL — ABNORMAL HIGH (ref 65–99)
Potassium: 4.4 mmol/L (ref 3.5–5.2)
SODIUM: 143 mmol/L (ref 134–144)

## 2017-03-05 NOTE — Telephone Encounter (Signed)
Spoke with patient and wife-aware of results and recommendations- BMET order placed.  Advised to monitor for swelling, SOB, or weight gain.  Patient and wife verbalized understanding.

## 2017-03-05 NOTE — Telephone Encounter (Signed)
F/u Message ° °Pt wife returning RN call .please call back to discuss  °

## 2017-03-20 LAB — BASIC METABOLIC PANEL
BUN/Creatinine Ratio: 17 (ref 10–24)
BUN: 33 mg/dL — AB (ref 8–27)
CALCIUM: 9 mg/dL (ref 8.6–10.2)
CHLORIDE: 107 mmol/L — AB (ref 96–106)
CO2: 21 mmol/L (ref 20–29)
Creatinine, Ser: 1.92 mg/dL — ABNORMAL HIGH (ref 0.76–1.27)
GFR calc non Af Amer: 32 mL/min/{1.73_m2} — ABNORMAL LOW (ref >59)
GFR, EST AFRICAN AMERICAN: 37 mL/min/{1.73_m2} — AB (ref >59)
Glucose: 124 mg/dL — ABNORMAL HIGH (ref 65–99)
Potassium: 4.4 mmol/L (ref 3.5–5.2)
Sodium: 140 mmol/L (ref 134–144)

## 2017-03-23 ENCOUNTER — Encounter: Payer: Self-pay | Admitting: Cardiovascular Disease

## 2017-03-23 ENCOUNTER — Ambulatory Visit: Payer: Medicare Other | Admitting: Cardiovascular Disease

## 2017-03-23 VITALS — BP 112/68 | HR 64 | Ht 68.5 in | Wt 207.4 lb

## 2017-03-23 DIAGNOSIS — I481 Persistent atrial fibrillation: Secondary | ICD-10-CM

## 2017-03-23 DIAGNOSIS — E785 Hyperlipidemia, unspecified: Secondary | ICD-10-CM | POA: Diagnosis not present

## 2017-03-23 DIAGNOSIS — I350 Nonrheumatic aortic (valve) stenosis: Secondary | ICD-10-CM | POA: Diagnosis not present

## 2017-03-23 DIAGNOSIS — N183 Chronic kidney disease, stage 3 unspecified: Secondary | ICD-10-CM

## 2017-03-23 DIAGNOSIS — Z79899 Other long term (current) drug therapy: Secondary | ICD-10-CM

## 2017-03-23 DIAGNOSIS — I2510 Atherosclerotic heart disease of native coronary artery without angina pectoris: Secondary | ICD-10-CM | POA: Diagnosis not present

## 2017-03-23 DIAGNOSIS — Z7901 Long term (current) use of anticoagulants: Secondary | ICD-10-CM | POA: Diagnosis not present

## 2017-03-23 DIAGNOSIS — I4819 Other persistent atrial fibrillation: Secondary | ICD-10-CM

## 2017-03-23 NOTE — Progress Notes (Signed)
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 will see him in 4 weeks for reevaluation. Time spent: 25 minutes  Troy Sine, MD, [facility]  03/24/2017 3:06 PM

## 2017-03-23 NOTE — Patient Instructions (Addendum)
Medication Instructions:  STOP spironolactone  DECREASE telmisartan (Micardis) to 40 mg (1/2 tablet) daily  Resume Ranexa 1000 mg two times daily-as tolerated  Labwork: Please return for labs in 3 weeks (BMET). You do NOT have to fast.  Follow-Up: Your physician recommends that you schedule a follow-up appointment in: 4-6 weeks with Dr. Tresa EndoKelly.   Any Other Special Instructions Will Be Listed Below (If Applicable).     If you need a refill on your cardiac medications before your next appointment, please call your pharmacy.

## 2017-03-24 ENCOUNTER — Encounter: Payer: Self-pay | Admitting: Cardiovascular Disease

## 2017-03-24 ENCOUNTER — Other Ambulatory Visit: Payer: Self-pay | Admitting: Cardiovascular Disease

## 2017-04-14 LAB — BASIC METABOLIC PANEL
BUN / CREAT RATIO: 16 (ref 10–24)
BUN: 26 mg/dL (ref 8–27)
CO2: 20 mmol/L (ref 20–29)
CREATININE: 1.65 mg/dL — AB (ref 0.76–1.27)
Calcium: 8.8 mg/dL (ref 8.6–10.2)
Chloride: 108 mmol/L — ABNORMAL HIGH (ref 96–106)
GFR calc Af Amer: 45 mL/min/{1.73_m2} — ABNORMAL LOW (ref >59)
GFR, EST NON AFRICAN AMERICAN: 39 mL/min/{1.73_m2} — AB (ref >59)
Glucose: 107 mg/dL — ABNORMAL HIGH (ref 65–99)
Potassium: 4.4 mmol/L (ref 3.5–5.2)
SODIUM: 140 mmol/L (ref 134–144)

## 2017-04-22 ENCOUNTER — Other Ambulatory Visit: Payer: Self-pay | Admitting: *Deleted

## 2017-04-22 DIAGNOSIS — D649 Anemia, unspecified: Secondary | ICD-10-CM

## 2017-04-22 NOTE — Progress Notes (Signed)
Patient called and given results of BMET. He requested to have a CBC done before his appointment on Tuesday 12/18 with Dr. Tresa EndoKelly so that he may check his hemoglobin level. The patient is currently on iron supplements and would like for them to be discontinued. Orders have been placed and patient made aware.

## 2017-04-23 ENCOUNTER — Telehealth: Payer: Self-pay | Admitting: Cardiovascular Disease

## 2017-04-23 DIAGNOSIS — D649 Anemia, unspecified: Secondary | ICD-10-CM

## 2017-04-23 LAB — CBC
Hematocrit: 31.8 % — ABNORMAL LOW (ref 37.5–51.0)
Hemoglobin: 10.3 g/dL — ABNORMAL LOW (ref 13.0–17.7)
MCH: 31.2 pg (ref 26.6–33.0)
MCHC: 32.4 g/dL (ref 31.5–35.7)
MCV: 96 fL (ref 79–97)
PLATELETS: 210 10*3/uL (ref 150–379)
RBC: 3.3 x10E6/uL — ABNORMAL LOW (ref 4.14–5.80)
RDW: 14.7 % (ref 12.3–15.4)
WBC: 5.1 10*3/uL (ref 3.4–10.8)

## 2017-04-23 NOTE — Telephone Encounter (Signed)
Patient aware of results. Will plan for blood work and stool specimen collection Monday 12/17 - has MD OV 12/18 and CBC can be ordered then. He agrees w/plan  Spoke with [facility] rep - there are no stool collection containers at [facility] [facility]. Patient has a collection container at home and he will check to see if he has the correct collection container which is a SMALL CONTAINER WITH GREEN LID - STICK ON A LID THAT IS TO BE INSERTED INTO STOOL SPECIMEN AND PUT BACK IN/ON LID - once collected, the specimen + plus [facility] have to [facility].   Advised patient to bring his empty collection contained on 12/18 so it can be viewed by staff to determine if this is correct and then he will be given order for specimen. Advised he will have lab work for iron studies on 12/18.

## 2017-04-23 NOTE — Telephone Encounter (Signed)
-----   Message from Lennette Biharihomas A Kelly, MD sent at 04/23/2017  3:45 PM EST ----- Hemoglobin and hematocrit have decreased to 10.3 and 31.8.  Check stool guaiac.  Check iron studies.  Follow-up CBC in 1 week

## 2017-04-27 ENCOUNTER — Encounter: Payer: Self-pay | Admitting: Cardiovascular Disease

## 2017-04-27 ENCOUNTER — Ambulatory Visit: Payer: Medicare Other | Admitting: Cardiovascular Disease

## 2017-04-27 VITALS — BP 120/70 | HR 60 | Ht 68.0 in | Wt 205.2 lb

## 2017-04-27 DIAGNOSIS — I10 Essential (primary) hypertension: Secondary | ICD-10-CM | POA: Diagnosis not present

## 2017-04-27 DIAGNOSIS — I480 Paroxysmal atrial fibrillation: Secondary | ICD-10-CM | POA: Diagnosis not present

## 2017-04-27 DIAGNOSIS — Z7901 Long term (current) use of anticoagulants: Secondary | ICD-10-CM | POA: Diagnosis not present

## 2017-04-27 DIAGNOSIS — N183 Chronic kidney disease, stage 3 unspecified: Secondary | ICD-10-CM

## 2017-04-27 DIAGNOSIS — I350 Nonrheumatic aortic (valve) stenosis: Secondary | ICD-10-CM | POA: Diagnosis not present

## 2017-04-27 DIAGNOSIS — I25118 Atherosclerotic heart disease of native coronary artery with other forms of angina pectoris: Secondary | ICD-10-CM

## 2017-04-27 DIAGNOSIS — D649 Anemia, unspecified: Secondary | ICD-10-CM | POA: Diagnosis not present

## 2017-04-27 MED ORDER — NITROGLYCERIN 0.4 MG/SPRAY TL SOLN: 1.0000 | 0 refills | Status: DC | PRN

## 2017-04-27 NOTE — Patient Instructions (Signed)
Medication Instructions:  Your physician recommends that you continue on your current medications as directed. Please refer to the Current Medication list given to you today.  Follow-Up: Your physician recommends that you schedule a follow-up appointment in: 4 months with Dr. Kelly.    Any Other Special Instructions Will Be Listed Below (If Applicable).     If you need a refill on your cardiac medications before your next appointment, please call your pharmacy.   
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
## 2017-04-29 ENCOUNTER — Ambulatory Visit: Payer: Medicare Other | Admitting: Cardiovascular Disease

## 2017-04-30 ENCOUNTER — Other Ambulatory Visit: Payer: Self-pay | Admitting: Cardiovascular Disease

## 2017-04-30 NOTE — Telephone Encounter (Signed)
°*  STAT* If patient is at the pharmacy, call can be transferred to refill team.   1. Which medications need to be refilled? (please list name of each medication and dose if known) need new prescriptions for Hydralazine,Ezetimibe and Nitroglycerin Spray  2. Which pharmacy/location (including street and city if local pharmacy) is medication to be sent to?[facility] [phone]  3. Do they need a 30 day or 90 day supply? Hydralazine #125 and Ezetimibe#90 and refills

## 2017-05-03 ENCOUNTER — Encounter: Payer: Self-pay | Admitting: Cardiovascular Disease

## 2017-05-27 ENCOUNTER — Other Ambulatory Visit: Payer: Self-pay | Admitting: Cardiovascular Disease

## 2017-05-27 NOTE — Telephone Encounter (Signed)
REFILL 

## 2017-06-04 ENCOUNTER — Other Ambulatory Visit: Payer: Self-pay | Admitting: Allergy and Immunology

## 2017-06-18 ENCOUNTER — Ambulatory Visit: Payer: Medicare Other | Admitting: Cardiovascular Disease

## 2017-06-18 ENCOUNTER — Encounter: Payer: Self-pay | Admitting: Cardiovascular Disease

## 2017-06-18 ENCOUNTER — Telehealth: Payer: Self-pay | Admitting: Cardiovascular Disease

## 2017-06-18 VITALS — BP 122/72 | HR 71 | Ht 68.0 in | Wt 208.2 lb

## 2017-06-18 DIAGNOSIS — E785 Hyperlipidemia, unspecified: Secondary | ICD-10-CM

## 2017-06-18 DIAGNOSIS — I10 Essential (primary) hypertension: Secondary | ICD-10-CM | POA: Diagnosis not present

## 2017-06-18 DIAGNOSIS — I2510 Atherosclerotic heart disease of native coronary artery without angina pectoris: Secondary | ICD-10-CM | POA: Diagnosis not present

## 2017-06-18 DIAGNOSIS — Z7901 Long term (current) use of anticoagulants: Secondary | ICD-10-CM

## 2017-06-18 DIAGNOSIS — N183 Chronic kidney disease, stage 3 unspecified: Secondary | ICD-10-CM

## 2017-06-18 DIAGNOSIS — I480 Paroxysmal atrial fibrillation: Secondary | ICD-10-CM | POA: Diagnosis not present

## 2017-06-18 DIAGNOSIS — Z951 Presence of aortocoronary bypass graft: Secondary | ICD-10-CM | POA: Diagnosis not present

## 2017-06-18 DIAGNOSIS — R400 Somnolence: Secondary | ICD-10-CM

## 2017-06-18 DIAGNOSIS — G478 Other sleep disorders: Secondary | ICD-10-CM

## 2017-06-18 DIAGNOSIS — I350 Nonrheumatic aortic (valve) stenosis: Secondary | ICD-10-CM | POA: Diagnosis not present

## 2017-06-18 MED ORDER — RANOLAZINE ER 1000 MG PO TB12: 500.0000 mg | ORAL_TABLET | Freq: Two times a day (BID) | ORAL | 3 refills | Status: DC

## 2017-06-18 MED ORDER — HYDRALAZINE HCL 25 MG PO TABS: 25.0000 mg | ORAL_TABLET | Freq: Three times a day (TID) | ORAL | 5 refills | Status: DC

## 2017-06-18 MED ORDER — AMIODARONE HCL 200 MG PO TABS: 200.0000 mg | ORAL_TABLET | Freq: Every day | ORAL | 3 refills | Status: DC

## 2017-06-18 NOTE — Telephone Encounter (Signed)
Call returned to the patient. She stated that her husband was started on Amiodarone today at his office visit. Their pharmacist stated that this medication has a minor reaction to Telmisartan. Message has been routed to pharmd for their recommendation.

## 2017-06-18 NOTE — Patient Instructions (Addendum)
Medication Instructions:  START amiodarone 200 mg daily  REDUCE Ranexa to 500 mg two times daily  REDUCE hydralazine to 25 mg three times daily  Testing/Procedures: Your physician has recommended that you have a sleep study. This test records several body functions during sleep, including: brain activity, eye movement, oxygen and carbon dioxide blood levels, heart rate and rhythm, breathing rate and rhythm, the flow of air through your mouth and nose, snoring, body muscle movements, and chest and belly movement. --this must be pre-approved by insurance prior to scheduling.  Once approved, someone from our office will contact you to get this scheduled.  Follow-Up: April as scheduled  Any Other Special Instructions Will Be Listed Below (If Applicable).     If you need a refill on your cardiac medications before your next appointment, please call your pharmacy.

## 2017-06-18 NOTE — Telephone Encounter (Signed)
Patient's wife made aware of recommendations and verbalized her understanding.

## 2017-06-18 NOTE — Progress Notes (Signed)
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 symptoms improved with improved hemoglobin.  He had been on iron replacement.  I had discontinued spironolactone secondary to his renal insufficiency and also reduced his telmisartan.  His renal function has improved.  Most recent serum creatinine on 05/26/2007 when checked by Dr. Dagmar Hait was 1.7 with a BUN of 20.  His anginal symptoms improved with titration of Ranexa to 1000 mg twice a day.  However, his ECG demonstrates that he again is develop recurrent atrial fibrillation.  He has not been on any antiarrhythmic therapy and has only been on previous rate control medication.  With his recurrent PAF, and his cardiovascular disease.  I have suggested initiation of low-dose amiodarone and will start this at 200 mg daily.  However, due to the amiodarone/Ranexa interaction.  I will further reduce Ranexa back to 500 mg twice a day.  Since his blood pressure has been low in the morning.  I have recommended slight reduction of hydralazine from 37.5 mg every 8 hours down to 25 mg every 8 hours.  With his recurrent AF, I also have recommended she undergo evaluation for obstructive sleep apnea.  Upon further questioning, he had a previous history of  significant snoring but this has improved with improvement in his allergy medication.  However, recently he has noticed more daytime sleepiness.  His Epworth Sleepiness Scale score today endorsed at 10.  I will schedule him for a sleep study for evaluation.  He is to contact me if he develops increasing chest pain, or recurrent symptomatology.  He will keep his previously scheduled appointment for follow-up in April for further evaluation.  He continues to be on atorvastatin and Zetia for hyperlipidemia with target less than 70.  He is tolerating eliquis for anticoagulation and is without bleeding.  Time spent: 25 minutes  Troy Sine, MD, [facility]  06/18/2017 12:11 PM

## 2017-06-18 NOTE — Telephone Encounter (Signed)
New message  Pt c/o medication issue:  1. Name of Medication: telmisartan (MICARDIS) 20 MG tablet and amiodarone (PACERONE) 200 MG tablet  2. How are you currently taking this medication (dosage and times per day)? Take 20 mg by mouth daily and Take 1 tablet (200 mg total) by mouth daily.  3. Are you having a reaction (difficulty breathing--STAT)? no  4. What is your medication issue? Pt states the pharmacy informed them that the amiodarone will have a mild counter action with the micardis and they just want to make sure its ok

## 2017-06-18 NOTE — Telephone Encounter (Signed)
They are fine to take together.

## 2017-06-27 ENCOUNTER — Other Ambulatory Visit: Payer: Self-pay | Admitting: Allergy and Immunology

## 2017-06-28 ENCOUNTER — Telehealth: Payer: Self-pay | Admitting: Cardiovascular Disease

## 2017-06-28 NOTE — Telephone Encounter (Signed)
Returned a call to patient's wife. She informs me they had to cancel the sleep study due to some personal home issues. They are not free right now to go during the week. I informed her that the sleep studies are done 7 nights a week. He can go over a weekend. She thanked me for telling her this and will call the sleep lab to reschedule possibly for a weekend night.

## 2017-06-28 NOTE — Telephone Encounter (Signed)
New message    Patient spouse calling to discuss rescheduling sleep study.

## 2017-06-29 ENCOUNTER — Encounter (HOSPITAL_BASED_OUTPATIENT_CLINIC_OR_DEPARTMENT_OTHER): Payer: Medicare Other

## 2017-07-02 ENCOUNTER — Other Ambulatory Visit: Payer: Self-pay | Admitting: Cardiovascular Disease

## 2017-07-05 ENCOUNTER — Other Ambulatory Visit: Payer: Self-pay | Admitting: Cardiovascular Disease

## 2017-07-05 DIAGNOSIS — I480 Paroxysmal atrial fibrillation: Secondary | ICD-10-CM

## 2017-07-05 DIAGNOSIS — I25118 Atherosclerotic heart disease of native coronary artery with other forms of angina pectoris: Secondary | ICD-10-CM

## 2017-07-05 DIAGNOSIS — R400 Somnolence: Secondary | ICD-10-CM

## 2017-07-05 DIAGNOSIS — I10 Essential (primary) hypertension: Secondary | ICD-10-CM

## 2017-07-05 DIAGNOSIS — I2510 Atherosclerotic heart disease of native coronary artery without angina pectoris: Secondary | ICD-10-CM

## 2017-07-07 ENCOUNTER — Ambulatory Visit (HOSPITAL_BASED_OUTPATIENT_CLINIC_OR_DEPARTMENT_OTHER): Payer: Medicare Other | Attending: Cardiovascular Disease | Admitting: Cardiovascular Disease

## 2017-07-07 VITALS — Ht 68.0 in | Wt 208.0 lb

## 2017-07-07 DIAGNOSIS — I480 Paroxysmal atrial fibrillation: Secondary | ICD-10-CM | POA: Insufficient documentation

## 2017-07-07 DIAGNOSIS — R0902 Hypoxemia: Secondary | ICD-10-CM | POA: Insufficient documentation

## 2017-07-07 DIAGNOSIS — I10 Essential (primary) hypertension: Secondary | ICD-10-CM | POA: Diagnosis present

## 2017-07-07 DIAGNOSIS — I2510 Atherosclerotic heart disease of native coronary artery without angina pectoris: Secondary | ICD-10-CM | POA: Insufficient documentation

## 2017-07-07 DIAGNOSIS — G4710 Hypersomnia, unspecified: Secondary | ICD-10-CM | POA: Diagnosis present

## 2017-07-07 DIAGNOSIS — R400 Somnolence: Secondary | ICD-10-CM

## 2017-07-07 DIAGNOSIS — G4733 Obstructive sleep apnea (adult) (pediatric): Secondary | ICD-10-CM | POA: Diagnosis not present

## 2017-07-18 ENCOUNTER — Encounter (HOSPITAL_BASED_OUTPATIENT_CLINIC_OR_DEPARTMENT_OTHER): Payer: Self-pay | Admitting: Cardiovascular Disease

## 2017-07-18 NOTE — Procedures (Signed)
Patient Name: Jorge Cherry, Jorge Cherry Study Date: 07/07/2017 Gender: Male D.O.B: 1935-09-22 Age (years): 27 Referring Provider: Nicki Guadalajara MD, ABSM Height (inches): 68 Interpreting Physician: Nicki Guadalajara MD, ABSM Weight (lbs): 208 RPSGT: Ulyess Mort BMI: 32 MRN: [id number] Neck Size: 16.00  CLINICAL INFORMATION Sleep Study Type: NPSG  Indication for sleep study: Excessive Daytime Sleepiness, Hypertension, PAF ??????? Epworth Sleepiness Score: 6  SLEEP STUDY TECHNIQUE As per the AASM Manual for the Scoring of Sleep and Associated Events v2.3 (April 2016) with a hypopnea requiring 4% desaturations.  The channels recorded and monitored were frontal, central and occipital EEG, electrooculogram (EOG), submentalis EMG (chin), nasal and oral airflow, thoracic and abdominal wall motion, anterior tibialis EMG, snore microphone, electrocardiogram, and pulse oximetry.  MEDICATIONS     albuterol (PROAIR HFA) 108 (90 Base) MCG/ACT inhaler         amiodarone (PACERONE) 200 MG tablet         amLODipine (NORVASC) 5 MG tablet         apixaban (ELIQUIS) 5 MG TABS tablet         aspirin EC 81 MG tablet         atorvastatin (LIPITOR) 20 MG tablet         Azelastine-Fluticasone 137-50 MCG/ACT SUSP         Cholecalciferol (VITAMIN D-3 PO)         ezetimibe (ZETIA) 10 MG tablet         fenofibrate (TRICOR) 145 MG tablet         fluticasone (FLOVENT HFA) 110 MCG/ACT inhaler         hydrALAZINE (APRESOLINE) 25 MG tablet         ipratropium (ATROVENT) 0.06 % nasal spray         isosorbide mononitrate (IMDUR) 60 MG 24 hr tablet         loratadine (CLARITIN) 10 MG tablet         metoprolol succinate (TOPROL-XL) 50 MG 24 hr tablet         nitroGLYCERIN (NITROLINGUAL) 0.4 MG/SPRAY spray         Omega-3 Fatty Acids (FISH OIL) 1000 MG CAPS         pantoprazole (PROTONIX) 40 MG tablet         pantoprazole (PROTONIX) 40 MG tablet         ranolazine (RANEXA) 1000 MG SR tablet         telmisartan  (MICARDIS) 20 MG tablet         torsemide (DEMADEX) 10 MG tablet         vitamin C (ASCORBIC ACID) 500 MG tablet         vitamin E 400 UNIT capsule         zinc gluconate 50 MG tablet      Medications self-administered by patient taken the night of the study : METOPROLOL SUCCINATE, FISH OIL, ELIQUIS, HYDRALAZINE, INDUR, RANEXA, VITAMIN E, VITAMIN C, CLARITIN, VITAMIN D-3, PROTONIX, LIPITOR, ASPIRIN, NORVASC, AMIODARONE  SLEEP ARCHITECTURE The study was initiated at 10:29:28 PM and ended at 4:55:42 AM.  Sleep onset time was 27.5 minutes and the sleep efficiency was 83.4%%. The total sleep time was 322.0 minutes.  Stage REM latency was 100.0 minutes.  The patient spent 3.7%% of the night in stage N1 sleep, 79.0%% in stage N2 sleep, 0.0%% in stage N3 and 17.24% in REM.  Alpha intrusion was absent.  Supine sleep was 0.78%.  RESPIRATORY PARAMETERS The overall apnea/hypopnea index (  AHI) was 8.6 per hour.  The respiratory disturbance index (RDI) was 10.8/h. There were 5 total apneas, including 3 obstructive, 1 central and 1 mixed apneas. There were 41 hypopneas and 12 RERAs.  The AHI during Stage REM sleep was 17.3 per hour.  AHI while supine was 0.0 per hour.  The mean oxygen saturation was 91.1%. The minimum SpO2 during sleep was 87.0%.  Soft snoring was noted during this study.  CARDIAC DATA The 2 lead EKG demonstrated sinus rhythm. The mean heart rate was 51.9 beats per minute. Other EKG findings include: PVCs.  LEG MOVEMENT DATA The total PLMS were 0 with a resulting PLMS index of 0.0. Associated arousal with leg movement index was 1.3 .  IMPRESSIONS - Mild obstructive sleep apnea overall (AHI 8.6/h); however, sleep apnea was moderate during REM sleep (AHI 17.3/h) - No significant central sleep apnea occurred during this study (CAI = 0.2/h). - Mild oxygen desaturation to a nadir of 87% - The patient snored with soft snoring volume. - EKG findings include PVCs. - Clinically  significant periodic limb movements did not occur during sleep. No significant associated arousals.  DIAGNOSIS - Obstructive Sleep Apnea (327.23 [G47.33 ICD-10]) - Nocturnal Hypoxemia (327.26 [G47.36 ICD-10])  RECOMMENDATIONS - In this patient with significant multiple cardiovascular comorbidities recommend therapeutic CPAP titration to determine optimal pressure required to alleviate sleep disordered breathing. - Effort should be made to optimize nasal and oral pharyngeal patency. - Avoid alcohol, sedatives and other CNS depressants that may worsen sleep apnea and disrupt normal sleep architecture. - Sleep hygiene should be reviewed to assess factors that may improve sleep quality. - Weight management and regular exercise should be initiated or continued if appropriate.  [Electronically signed] 07/18/2017 02:37 PM  Nicki Guadalajarahomas Danyal Whitenack MD, [facility], ABSM Diplomate, American Board of Sleep Medicine   NPI: [id number]  [facility] PH: [phone]   FX: [phone]) [phone] ACCREDITED BY THE AMERICAN ACADEMY OF SLEEP MEDICINE

## 2017-07-19 ENCOUNTER — Telehealth: Payer: Self-pay | Admitting: *Deleted

## 2017-07-19 ENCOUNTER — Other Ambulatory Visit: Payer: Self-pay | Admitting: Cardiovascular Disease

## 2017-07-19 DIAGNOSIS — G4733 Obstructive sleep apnea (adult) (pediatric): Secondary | ICD-10-CM

## 2017-07-19 NOTE — Telephone Encounter (Signed)
-----   Message from Thomas A Kelly, MD sent at 07/18/2017  2:41 PM EDT ----- Jorge Cherry, please of the patient the results of the sleep study set up for CPAP titration study 

## 2017-07-19 NOTE — Progress Notes (Signed)
07/19/17 patient and wife notified of sleep study results and recommendations. CPAP titration study planned for 07/22/17.

## 2017-07-19 NOTE — Telephone Encounter (Deleted)
-----   Message from Lennette Biharihomas A Kelly, MD sent at 07/18/2017  2:41 PM EDT ----- Burna MortimerWanda, please of the patient the results of the sleep study set up for CPAP titration study

## 2017-07-19 NOTE — Telephone Encounter (Signed)
Patient and wife informed of sleep study results and recommendations.

## 2017-07-20 ENCOUNTER — Other Ambulatory Visit: Payer: Self-pay | Admitting: Cardiovascular Disease

## 2017-07-20 ENCOUNTER — Other Ambulatory Visit: Payer: Self-pay | Admitting: Allergy and Immunology

## 2017-07-22 ENCOUNTER — Encounter: Payer: Self-pay | Admitting: Family Medicine

## 2017-07-22 ENCOUNTER — Ambulatory Visit: Payer: Medicare Other | Admitting: Family Medicine

## 2017-07-22 ENCOUNTER — Ambulatory Visit (HOSPITAL_BASED_OUTPATIENT_CLINIC_OR_DEPARTMENT_OTHER): Payer: Medicare Other | Attending: Cardiovascular Disease | Admitting: Cardiovascular Disease

## 2017-07-22 VITALS — Ht 68.0 in | Wt 208.0 lb

## 2017-07-22 VITALS — BP 122/70 | HR 68 | Resp 16

## 2017-07-22 DIAGNOSIS — G4733 Obstructive sleep apnea (adult) (pediatric): Secondary | ICD-10-CM | POA: Insufficient documentation

## 2017-07-22 DIAGNOSIS — Z7902 Long term (current) use of antithrombotics/antiplatelets: Secondary | ICD-10-CM | POA: Diagnosis not present

## 2017-07-22 DIAGNOSIS — Z79899 Other long term (current) drug therapy: Secondary | ICD-10-CM | POA: Diagnosis not present

## 2017-07-22 DIAGNOSIS — J3089 Other allergic rhinitis: Secondary | ICD-10-CM

## 2017-07-22 DIAGNOSIS — I480 Paroxysmal atrial fibrillation: Secondary | ICD-10-CM | POA: Diagnosis not present

## 2017-07-22 DIAGNOSIS — J4520 Mild intermittent asthma, uncomplicated: Secondary | ICD-10-CM | POA: Diagnosis not present

## 2017-07-22 DIAGNOSIS — Z7982 Long term (current) use of aspirin: Secondary | ICD-10-CM | POA: Insufficient documentation

## 2017-07-22 DIAGNOSIS — K219 Gastro-esophageal reflux disease without esophagitis: Secondary | ICD-10-CM

## 2017-07-22 MED ORDER — PANTOPRAZOLE SODIUM 40 MG PO TBEC: 40.0000 mg | DELAYED_RELEASE_TABLET | Freq: Two times a day (BID) | ORAL | 5 refills | Status: DC

## 2017-07-22 NOTE — Patient Instructions (Addendum)
  1. Continue action plan for asthma flare including Flovent 110 3 inhalations 3 times a day  2. Continue Dymista one spray each nostril one-2 times per day as needed   3. Continue pantoprazole 40 mg daily  4. Continue ProAir HFA and antihistamine if needed  5. Can add ipratropium 0.06% nasal spray 2 sprays each nostril every 6 hours if needed to dry up nose as needed. May use prior to eating.  6. Eliminate all caffeine consumption as possible  7. Return to clinic in 6 months or earlier if problem   8. Follow up with your family doctor about the area on your head

## 2017-07-22 NOTE — Progress Notes (Signed)
[address] [place] [address] Dept: [phone]  FOLLOW UP NOTE  Patient ID: Jorge Cherry, male    DOB: 11/28/1935  Age: 82 y.o. MRN: [id number] Date of Office Visit: 07/22/2017  Assessment  Chief Complaint: Asthma  HPI Jorge Cherry is a 82 year old male who presents to the clinic for a follow-up visit.  He was last seen in this clinic on 09/22/2016 by Dr. Lucie LeatherKozlow for evaluation of intermittent asthma, allergic rhinoconjunctivitis, and LPR.  At that visit, his asthma was reported as well controlled.  He was relatively in active secondary to angina.  He did not need to activate his action plan for an asthma flare and rarely used for short acting bronchodilator.  Allergic rhinitis is reported as well controlled with the intermittent use of Dymista and ipratropium.  Reflux is reported as well controlled with pantoprazole and dietary modifications including decreasing tea consumption.  At today's visit, he reports that he remains relatively inactive due to atrial fibrillation and aortic valve dysfunction for which he takes Eliquis daily.  In February 2018 his ejection fraction was estimated at 55 percent. Jorge Cherry's asthma has been well controlled. He has not required rescue medication, experienced nocturnal awakenings due to lower respiratory symptoms, nor have activities of daily living been limited. He has required no Emergency Department or Urgent Care visits for his asthma. He has required zero courses of systemic steroids for asthma exacerbations since the last visit.  He reports that he has not needed to activate his asthma action plan and he has not needed his albuterol rescue inhaler in the last several months.  Allergic rhinitis is reported as well controlled with no medical intervention.  He reports he stopped using Dymista over 2 months ago due to nasal dryness and bleeding.  He now uses aromatherapy beads from [facility] several times a day with continued success.   GERD is  reported as well controlled.  He continues to take Protonix 40 mg once a day and he has reduced his caffeine consumption to 1 glass of tea per week.  He does report an increase in gas as he has started eating grits on a daily basis in an effort to increase his iron intake.  He reports a small area on the top of his head that is rough feeling, not itchy, and never bleeds that appeared about a month ago.  He reports it has not changed in character since he noticed this area.  Drug Allergies:  Allergies  Allergen Reactions  . Altace [Ramipril] Other (See Comments)    Mouth swelling  . Mucinex [Guaifenesin Er] Hives and Other (See Comments)    Mouth swelling  . Contrast Media [Iodinated Diagnostic Agents] Other (See Comments)    Made eyes change each time    Physical Exam: BP 122/70 (BP Location: Left Arm, Patient Position: Sitting, Cuff Size: Normal)   Pulse 68   Resp 16    Physical Exam  Constitutional: He is oriented to person, place, and time. He appears well-developed and well-nourished.  HENT:  Head: Normocephalic and atraumatic.  Right Ear: External ear normal.  Left Ear: External ear normal.  Nose: Nose normal.  Mouth/Throat: Oropharynx is clear and moist.  Eyes: Conjunctivae are normal.  Neck: Normal range of motion. Neck supple.  Cardiovascular: Normal rate and regular rhythm.  Murmur heard. S1-S2 normal.  Regular heart rate and rhythm.  Systolic murmur noted  Pulmonary/Chest: Effort normal and breath sounds normal.  Lungs clear to auscultation  Musculoskeletal: Normal range of motion.  Neurological: He is alert and oriented to person, place, and time.  Skin: Skin is warm and dry.  There is a small area on the top of his head with an irregular border and flaky skin.  The area is not reddened and there is no drainage.  Psychiatric: He has a normal mood and affect. His behavior is normal.    Diagnostics: FVC 3.39, FEV1 3.05.  Predicted FVC 3.76, predicted FEV1 2.67.   Spirometry is within the normal range.  Assessment and Plan: 1. Mild intermittent asthma without complication   2. Other allergic rhinitis   3. Paroxysmal atrial fibrillation (HCC)   4. LPRD (laryngopharyngeal reflux disease)     Meds ordered this encounter  Medications  . pantoprazole (PROTONIX) 40 MG tablet    Sig: Take 1 tablet (40 mg total) by mouth 2 (two) times daily.    Dispense:  60 tablet    Refill:  5    Needs appt for future refills    1. Continue action plan for asthma flare including Flovent 110 3 inhalations 3 times a day  2. Continue Dymista one spray each nostril one-2 times per day as needed   3. Continue pantoprazole 40 mg daily  4. Continue ProAir HFA and antihistamine if needed  5. Can add ipratropium 0.06% nasal spray 2 sprays each nostril every 6 hours if needed to dry up nose as needed. May use prior to eating.  6. Eliminate all caffeine consumption as possible  7. Return to clinic in 6 months or earlier if problem   8. Follow up with your family doctor about the area on your head   Return in about 6 months (around 01/22/2018), or if symptoms worsen or fail to improve.    Thank you for the opportunity to care for this patient.  Please do not hesitate to contact me with questions.  Jorge Leyland, FNP [facility]

## 2017-08-08 ENCOUNTER — Encounter (HOSPITAL_BASED_OUTPATIENT_CLINIC_OR_DEPARTMENT_OTHER): Payer: Self-pay | Admitting: Cardiovascular Disease

## 2017-08-08 NOTE — Procedures (Signed)
Patient Name: Jorge Cherry, Jorge Cherry Study Date: 07/22/2017 Gender: Male D.O.B: 12-22-1935 Age (years): [age] Referring Provider: Nicki Guadalajarahomas Quinne Pires MD, ABSM Height (inches): 68 Interpreting Physician: Nicki Guadalajarahomas Genean Adamski MD, ABSM Weight (lbs): 208 RPSGT: Cherylann ParrDubili, Fred BMI: 32 MRN: [id number] Neck Size: 16.00  CLINICAL INFORMATION The patient is referred for a CPAP titration to treat sleep apnea.  Date of NPSG:  07/07/2017: AHI 8.6/h; RDI 10.8/h; AHI during REM sleep 17.3/h; oxygen desaturation to 87%.  SLEEP STUDY TECHNIQUE As per the AASM Manual for the Scoring of Sleep and Associated Events v2.3 (April 2016) with a hypopnea requiring 4% desaturations.  The channels recorded and monitored were frontal, central and occipital EEG, electrooculogram (EOG), submentalis EMG (chin), nasal and oral airflow, thoracic and abdominal wall motion, anterior tibialis EMG, snore microphone, electrocardiogram, and pulse oximetry. Continuous positive airway pressure (CPAP) was initiated at the beginning of the study and titrated to treat sleep-disordered breathing.  MEDICATIONS     albuterol (PROAIR HFA) 108 (90 Base) MCG/ACT inhaler         amiodarone (PACERONE) 200 MG tablet         amLODipine (NORVASC) 5 MG tablet         apixaban (ELIQUIS) 5 MG TABS tablet         aspirin EC 81 MG tablet         atorvastatin (LIPITOR) 20 MG tablet         Azelastine-Fluticasone 137-50 MCG/ACT SUSP         Cholecalciferol (VITAMIN D-3 PO)         ezetimibe (ZETIA) 10 MG tablet         fenofibrate (TRICOR) 145 MG tablet         fluticasone (FLOVENT HFA) 110 MCG/ACT inhaler         hydrALAZINE (APRESOLINE) 25 MG tablet         ipratropium (ATROVENT) 0.06 % nasal spray         isosorbide mononitrate (IMDUR) 60 MG 24 hr tablet         loratadine (CLARITIN) 10 MG tablet         metoprolol succinate (TOPROL-XL) 50 MG 24 hr tablet         nitroGLYCERIN (NITROLINGUAL) 0.4 MG/SPRAY spray         Omega-3 Fatty Acids (FISH OIL) 1000  MG CAPS         pantoprazole (PROTONIX) 40 MG tablet         ranolazine (RANEXA) 1000 MG SR tablet         telmisartan (MICARDIS) 20 MG tablet         torsemide (DEMADEX) 10 MG tablet         vitamin C (ASCORBIC ACID) 500 MG tablet         vitamin E 400 UNIT capsule         zinc gluconate 50 MG tablet      Medications self-administered by patient taken the night of the study : METOPROLOL SUCCINATE, FISH OIL, ELIQUIS, HYDRALAZINE, INDUR, RANEXA, VITAMIN E, VITAMIN C, CLARITIN, VITAMIN D-3, PROTONIX, LIPITOR, ASPIRIN, NORVASC, AMIODARONE  TECHNICIAN COMMENTS Comments added by technician: Patient had difficulty initiating sleep.  RESPIRATORY PARAMETERS Optimal PAP Pressure (cm): 12 AHI at Optimal Pressure (/hr): 0.0 Overall Minimal O2 (%): 87.0 Supine % at Optimal Pressure (%): 0 Minimal O2 at Optimal Pressure (%): 93.0   SLEEP ARCHITECTURE The study was initiated at 10:45:10 PM and ended at 4:45:06 AM.  Sleep onset time was  22.9 minutes and the sleep efficiency was 73.4%%. The total sleep time was 264.1 minutes.  The patient spent 4.4%% of the night in stage N1 sleep, 66.7%% in stage N2 sleep, 5.1%% in stage N3 and 23.86% in REM.Stage REM latency was 67.5 minutes  Wake after sleep onset was 73.0. Alpha intrusion was absent. Supine sleep was 0.00%.  CARDIAC DATA The 2 lead EKG demonstrated atrial fibrillation. The mean heart rate was 52.8 beats per minute.   LEG MOVEMENT DATA The total Periodic Limb Movements of Sleep (PLMS) were 0. The PLMS index was 0.0. A PLMS index of <15 is considered normal in adults.  IMPRESSIONS - The optimal PAP pressure was 12 cm of water.  AHI at 12 cm was 0.  Oxygen nadir was 93%. - Central sleep apnea was not noted during this titration (CAI = 0.0/h). - Mild oxygen desaturation to a nadir of 87% at 7 cwp. - No snoring was audible during this study. - The patient was in atrial fibrillation during the study. - Clinically significant periodic limb  movements were not noted during this study. Arousals associated with PLMs were rare.  DIAGNOSIS - Obstructive Sleep Apnea (327.23 [G47.33 ICD-10])  RECOMMENDATIONS - Recommend an initial trial of CPAP therapy with EPR of 3 at 12 cm H2O with heated humidification. A Medium size Resmed Full Face Mask AirTouch mask was used for the titration study. - Effforts should be made to optimize nasal and oropharyngeal patency. - Avoid alcohol, sedatives and other CNS depressants that may worsen sleep apnea and disrupt normal sleep architecture. - Sleep hygiene should be reviewed to assess factors that may improve sleep quality. - Weight management (BMI 32) and regular exercise should be initiated. - Recommend a download be obtained to 30 days and the patient returned to sleep clinic after 4 weeks of therapy. -   [Electronically signed] 08/08/2017 10:50 PM  Nicki Guadalajara MD, [facility], ABSM Diplomate, American Board of Sleep Medicine   NPI: [id number]  [place] [facility] PH: [phone]   FX: [phone] ACCREDITED BY THE AMERICAN ACADEMY OF SLEEP MEDICINE

## 2017-08-10 ENCOUNTER — Telehealth: Payer: Self-pay | Admitting: *Deleted

## 2017-08-10 NOTE — Telephone Encounter (Signed)
-----   Message from Lennette Biharihomas A Kelly, MD sent at 08/08/2017 10:55 PM EDT ----- .  Jorge MortimerWanda.  Please notify the patient the results and set up with the DME company for initiation of CPAP therapy.  Arrange a follow-up sleep evaluation

## 2017-08-10 NOTE — Telephone Encounter (Signed)
Informed patient's wife CPAP referral sent to [facility]. Once they have benefits approval from his insurance they will call them to schedule a set up appointment. Wife voiced her understanding of the information given to her today.

## 2017-08-10 NOTE — Progress Notes (Signed)
Patients wife notified

## 2017-08-18 ENCOUNTER — Other Ambulatory Visit: Payer: Self-pay | Admitting: Cardiovascular Disease

## 2017-08-21 ENCOUNTER — Other Ambulatory Visit: Payer: Self-pay | Admitting: Cardiovascular Disease

## 2017-08-23 NOTE — Telephone Encounter (Signed)
Rx has been sent to the pharmacy electronically. ° °

## 2017-08-26 ENCOUNTER — Ambulatory Visit: Payer: Medicare Other | Admitting: Cardiovascular Disease

## 2017-08-26 ENCOUNTER — Encounter: Payer: Self-pay | Admitting: Cardiovascular Disease

## 2017-08-26 VITALS — BP 106/70 | HR 56 | Ht 68.5 in | Wt 209.0 lb

## 2017-08-26 DIAGNOSIS — I480 Paroxysmal atrial fibrillation: Secondary | ICD-10-CM

## 2017-08-26 DIAGNOSIS — Z951 Presence of aortocoronary bypass graft: Secondary | ICD-10-CM | POA: Diagnosis not present

## 2017-08-26 DIAGNOSIS — I25118 Atherosclerotic heart disease of native coronary artery with other forms of angina pectoris: Secondary | ICD-10-CM

## 2017-08-26 DIAGNOSIS — G4733 Obstructive sleep apnea (adult) (pediatric): Secondary | ICD-10-CM

## 2017-08-26 DIAGNOSIS — I350 Nonrheumatic aortic (valve) stenosis: Secondary | ICD-10-CM

## 2017-08-26 MED ORDER — METOPROLOL SUCCINATE ER 50 MG PO TB24: 50.0000 mg | ORAL_TABLET | Freq: Every day | ORAL | 3 refills | Status: DC

## 2017-08-26 MED ORDER — AMIODARONE HCL 200 MG PO TABS: 200.0000 mg | ORAL_TABLET | Freq: Two times a day (BID) | ORAL | 3 refills | Status: DC

## 2017-08-26 NOTE — Patient Instructions (Signed)
Medication Instructions:  DECREASE metoprolol succinate (toprol XL) to 50 mg daily INCREASE amiodarone to 200 mg two times daily  Follow-Up: 2 months with Dr. Tresa EndoKelly  Any Other Special Instructions Will Be Listed Below (If Applicable).     If you need a refill on your cardiac medications before your next appointment, please call your pharmacy.

## 2017-08-26 NOTE — Progress Notes (Signed)
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 mild pulmonary hypertension.  .  Subsequently he had complained of experiencing some episodes of chest tightness and his symptoms improved with further titration of nitrates as well as beta blocker therapy.  He developed significant anemia, which undoubtedly exacerbated his anginal symptomatology.  At his most recent catheterization Cherry July 2018  although his LIMA to mid LAD and vein graft to diagonal vessel are patent the very proximal LAD had a 95% stenosis which supplies a diagonal vessel, which is not supplied by the grafts.  His native RCA and graft to RCA is occluded and there is some collateralization to the distal RCA via the vein graft supplying the circumflex marginal vessel.  Over the last several months, anginal symptoms improved with improved hemoglobin.  He had been on iron replacement.  I had discontinued spironolactone secondary to his renal insufficiency and also reduced his telmisartan.  His renal function has improved.  Serum creatinine on 05/25/2017 when checked by Dr. Dagmar Hait was 1.7 with a BUN of 20.  His anginal symptoms improved with titration of Ranexa to 1000 mg twice a day.  However, his ECG demonstrates that he again is develop recurrent atrial fibrillation.  He has not been on any antiarrhythmic therapy and has only been on previous rate control medication.  With his recurrent PAF, and his cardiovascular disease initiated low-dose amiodarone at 200  mg daily , at his last visit. Due to the amiodarone/Ranexa interaction I reduced Ranexa back to 500 mg twice a day.when I last saw him, I also reduced his hydralazine.  Because of his a half.  I recommended Ace sleep apnea evaluation and I reviewed both his diagnostic polysomnogram and CPAP titrated with him Cherry detail today.  We have contacted his DME company so that CPAP set up chem be done expeditiously.  He felt that with initiation of amiodarone that he had returned back to sinus rhythm but over the past 5-7 days.  He has noticed irregularity to his rhythm.  His ECG confirms atrial fibrillation.  I will further titrate amiodarone up to 200 mg twice a day.  I am reducing his Toprol-XL to 50 mg daily from his present dose of twice a day.  Further dose reduction may be necessary if bradycardia develops.  He is on eliquis for anticoagulation and denies recent bleeding.  He continues to be on atorvastatin and Zetia for hyperlipidemia with target LDL less than 70.  Cherry July 2018 LDL was 40.  I will see him Cherry 2 months for reevaluation or sooner if problems arise.  Time spent: 25 minutes Troy Sine, MD, [facility]  08/27/2017 6:46 PM

## 2017-08-27 ENCOUNTER — Encounter: Payer: Self-pay | Admitting: Cardiovascular Disease

## 2017-09-07 ENCOUNTER — Telehealth: Payer: Self-pay | Admitting: *Deleted

## 2017-09-07 NOTE — Telephone Encounter (Signed)
Returned a call to patient's wife. She left a message for me to call because she still hasn't heard back from [facility] regarding patient's CPAP machine. I called and spoke with Jasmine December and she advised me that she has been behind with the referrals and is just now catching up. She will be submitting his for approval today. [facility] contact information given to patient's wife for future follow up.

## 2017-09-26 ENCOUNTER — Other Ambulatory Visit: Payer: Self-pay | Admitting: Cardiovascular Disease

## 2017-09-27 NOTE — Telephone Encounter (Signed)
Rx has been sent to the pharmacy electronically. ° °

## 2017-10-11 ENCOUNTER — Telehealth: Payer: Self-pay | Admitting: Cardiovascular Disease

## 2017-10-11 NOTE — Telephone Encounter (Signed)
Spoke with pt, this morning while sitting in the chair he suddenly felt a impact to his eye from hitting the corner of the chair. He feels like he may have blacked out. He feels fine now. There was no warning signs, no dizziness or other symptoms. The only difference he has seen this morning was the CPAP pressure seemed to be higher than when he went to bed because the air was blowing out the side of his mask, otherwise the morning has been normal for him.he has not checked his bp or pulse. l forward to dr Tresa Endokelly to review and advise.

## 2017-10-11 NOTE — Telephone Encounter (Signed)
Doubt related to CPAP.  Agree with checking HR and BP;  if recurrent sx then arrange to f/u.

## 2017-10-11 NOTE — Telephone Encounter (Signed)
Spoke with pt, [facility]. Now the patient is c/o feeling weak and not being able to do anything. He reports chest pain when bending over or with exertion. He reports he has had this since the last cath dr Tresa Endokelly did and tried to stent on of his arteries. He reports chest pain with walking to the mailbox, working in the yard, walking at a brisk pace or stooping. If he rest the chest pain will go away. He is using his NTG about 2x a week. He reports he took his bp this morning after the episode and it was 156/82 p=52 and later 135/77 p=56. They are wanting a sooner appt than July with dr Tresa Endokelly. Offered patient an appt with the APP but they would prefer to see dr Tresa Endokelly. Explained will make haley, [facility], aware so she can add him to the schedule as directed by dr Tresa Endokelly. Pt agreed with this plan.

## 2017-10-11 NOTE — Telephone Encounter (Signed)
New Message   Patient wifer Jorge Hatchnn called on behalf of patient. She states that the patient passed out this morning.   Pt c/o Syncope: STAT if syncope occurred within 30 minutes and pt complains of lightheadedness High Priority if episode of passing out, completely, today or in last 24 hours   1. Did you pass out today? yes   2. When is the last time you passed out? First time.  3. Has this occurred multiple times? no  4. Did you have any symptoms prior to passing out? Some diizziness, patient looks kind of pale

## 2017-10-15 NOTE — Telephone Encounter (Signed)
Left message to call back  

## 2017-10-19 NOTE — Telephone Encounter (Signed)
Spoke to patient and wife-appt scheduled for 6/20 at 1120 am

## 2017-10-28 ENCOUNTER — Ambulatory Visit: Payer: Medicare Other | Admitting: Cardiovascular Disease

## 2017-10-28 ENCOUNTER — Encounter: Payer: Self-pay | Admitting: Cardiovascular Disease

## 2017-10-28 VITALS — BP 118/78 | HR 47 | Ht 68.0 in | Wt 213.4 lb

## 2017-10-28 DIAGNOSIS — I10 Essential (primary) hypertension: Secondary | ICD-10-CM | POA: Diagnosis not present

## 2017-10-28 DIAGNOSIS — I208 Other forms of angina pectoris: Secondary | ICD-10-CM

## 2017-10-28 DIAGNOSIS — Z951 Presence of aortocoronary bypass graft: Secondary | ICD-10-CM

## 2017-10-28 DIAGNOSIS — I350 Nonrheumatic aortic (valve) stenosis: Secondary | ICD-10-CM | POA: Diagnosis not present

## 2017-10-28 DIAGNOSIS — I25118 Atherosclerotic heart disease of native coronary artery with other forms of angina pectoris: Secondary | ICD-10-CM | POA: Diagnosis not present

## 2017-10-28 DIAGNOSIS — I480 Paroxysmal atrial fibrillation: Secondary | ICD-10-CM

## 2017-10-28 DIAGNOSIS — G4733 Obstructive sleep apnea (adult) (pediatric): Secondary | ICD-10-CM

## 2017-10-28 DIAGNOSIS — Z7901 Long term (current) use of anticoagulants: Secondary | ICD-10-CM | POA: Diagnosis not present

## 2017-10-28 MED ORDER — AMLODIPINE BESYLATE 5 MG PO TABS: 7.5000 mg | ORAL_TABLET | Freq: Every day | ORAL | 0 refills | Status: DC

## 2017-10-28 MED ORDER — METOPROLOL SUCCINATE ER 50 MG PO TB24: 25.0000 mg | ORAL_TABLET | Freq: Two times a day (BID) | ORAL | 3 refills | Status: DC

## 2017-10-28 NOTE — Progress Notes (Signed)
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 developed significant blood pressure lability requiring medication addition. In July 2017 he was hospitalized with atrial fibrillation in the setting of bradycardia and hypotension leading to medication adjustment  An echo Doppler study of 11/21/2015 showed an EF of 55-60%, mild aortic stenosis with a valve area of 1.75 mm per there was mild pulmonary hypertension. Subsequently he had complained of experiencing some episodes of chest tightness and his symptoms improved with further titration of nitrates as well as beta blocker therapy.  He developed significant anemia, which undoubtedly exacerbated his anginal symptomatology.  At his most recent catheterization in July 2018 although his LIMA to mid LAD and vein graft to diagonal vessel are patent the very proximal LAD had a 95% stenosis which supplies a diagonal vessel, which is not supplied by the grafts.  His native RCA and graft to RCA is occluded and there is some collateralization to the distal RCA via the vein graft supplying the circumflex marginal vessel.  His anginal symptoms have improved with stabilization of his hemoglobin.  Spironolactone was discontinued secondary to  renal insufficiency.  He continues to experience class II anginal symptomatology particularly with walking up to 100 yards.  I am recommending further titration of amlodipine from 5 mg up to 7.5 mg daily.  He continues to be on isosorbide 90 mg in the morning and 30 mg in the evening, ranolazine 500 mg twice a day, telmisartan 20 mg torsemide 10 mg and he has been taking Toprol-XL 50 mg daily.  His ECG today shows atrial fibrillation with rates typically in the 50s 60s but following a pause it drops into the low 40s.  I have recommended he change his metoprolol and take this 25 mg twice a day rather than 50 mg in the morning.  He is now on CPAP therapy.  He is meeting compliance standards.  I reviewed his download in detail.  He continues to be on Eliquis for anticoagulation and denies bleeding.  He continues to be on atorvastatin 20 mg and Zetia 10 mg for hyperlipidemia with target LDL less than 70.  I will see him in 2 to 3 months  for reevaluation or sooner if problems arise.   Time spent: 25 minutes Troy Sine, MD, [facility]  10/30/2017 11:46 AM

## 2017-10-28 NOTE — Patient Instructions (Signed)
Medication Instructions:  DECREASE METOPROLOL 25MG (1/2 TAB) TWICE DAILY, MAY HOLD AFTERNOON DOSING IF HR<40 BEATS.  INCREASE AMLODIPINE 7.5MG  DAILY (-1/2 TAB)  If you need a refill on your cardiac medications before your next appointment, please call your pharmacy.  Follow-Up: Your physician wants you to follow-up in: 2 MONTHS WITH DR Tresa EndoKELLY.  Thank you for choosing [facility]!!

## 2017-10-30 ENCOUNTER — Encounter: Payer: Self-pay | Admitting: Cardiovascular Disease

## 2017-11-03 ENCOUNTER — Telehealth: Payer: Self-pay | Admitting: Cardiovascular Disease

## 2017-11-03 NOTE — Telephone Encounter (Signed)
New Message:     Pt saw Dr Kelly on 6[name]. Dr Jorge EndoKelly wants to see patient in September. He told patient if his schedule was full for September,that you would work it out.

## 2017-11-05 NOTE — Telephone Encounter (Signed)
Appointment scheduled 9/18 at 4 pm with Dr. Tresa EndoKelly

## 2017-11-05 NOTE — Telephone Encounter (Signed)
Returned call and left message making aware appt scheduled.  Advised to call back with questions or concerns.

## 2017-11-08 ENCOUNTER — Ambulatory Visit: Payer: Medicare Other | Admitting: Cardiovascular Disease

## 2017-11-08 ENCOUNTER — Encounter

## 2017-11-27 ENCOUNTER — Other Ambulatory Visit: Payer: Self-pay | Admitting: Cardiovascular Disease

## 2017-11-29 NOTE — Telephone Encounter (Signed)
Rx sent to pharmacy   

## 2017-12-09 ENCOUNTER — Telehealth: Payer: Self-pay | Admitting: Cardiovascular Disease

## 2017-12-09 NOTE — Telephone Encounter (Signed)
New Message   Pt c/o medication issue:  1. Name of Medication: all  2. How are you currently taking this medication (dosage and times per day)? n/a  3. Are you having a reaction (difficulty breathing--STAT)? yes  4. What is your medication issue?  Pt's wife is calling, states that the pt bp has been running around 100/54 and he is experiencing dizziness when he tries to walk around so they would like his meds adjusted. Please call

## 2017-12-09 NOTE — Telephone Encounter (Signed)
Spoke with pt and wife who states for the past week pt has be feeling dizzy. Pt checked BP today and it was 100/54. He reports he's been drinking more fluids and repeat BP 119/84. He states he has completely stop taking metoprolol but still carry it around in case he notice a big increase in BP. Appointment scheduled for 12/14/17 at 9 am with Corine ShelterLuke Kilroy, PA. Pt instructed to continue to monitor BP.

## 2017-12-09 NOTE — Telephone Encounter (Signed)
Left message to call back  

## 2017-12-14 ENCOUNTER — Encounter: Payer: Self-pay | Admitting: Cardiology

## 2017-12-14 ENCOUNTER — Ambulatory Visit: Payer: Medicare Other | Admitting: Cardiology

## 2017-12-14 VITALS — BP 122/63 | HR 75 | Ht 68.0 in | Wt 206.6 lb

## 2017-12-14 DIAGNOSIS — I350 Nonrheumatic aortic (valve) stenosis: Secondary | ICD-10-CM

## 2017-12-14 DIAGNOSIS — I480 Paroxysmal atrial fibrillation: Secondary | ICD-10-CM | POA: Diagnosis not present

## 2017-12-14 DIAGNOSIS — I25118 Atherosclerotic heart disease of native coronary artery with other forms of angina pectoris: Secondary | ICD-10-CM | POA: Diagnosis not present

## 2017-12-14 DIAGNOSIS — I5032 Chronic diastolic (congestive) heart failure: Secondary | ICD-10-CM | POA: Diagnosis not present

## 2017-12-14 DIAGNOSIS — N183 Chronic kidney disease, stage 3 unspecified: Secondary | ICD-10-CM

## 2017-12-14 DIAGNOSIS — I2089 Other forms of angina pectoris: Secondary | ICD-10-CM

## 2017-12-14 DIAGNOSIS — I208 Other forms of angina pectoris: Secondary | ICD-10-CM

## 2017-12-14 DIAGNOSIS — Z7901 Long term (current) use of anticoagulants: Secondary | ICD-10-CM

## 2017-12-14 DIAGNOSIS — D649 Anemia, unspecified: Secondary | ICD-10-CM

## 2017-12-14 DIAGNOSIS — I10 Essential (primary) hypertension: Secondary | ICD-10-CM

## 2017-12-14 MED ORDER — METOPROLOL SUCCINATE ER 25 MG PO TB24: 25.0000 mg | ORAL_TABLET | Freq: Every day | ORAL | 2 refills | Status: DC

## 2017-12-14 MED ORDER — NITROGLYCERIN 0.4 MG/SPRAY TL SOLN: 1.0000 | 0 refills | Status: DC | PRN

## 2017-12-14 MED ORDER — AMLODIPINE BESYLATE 5 MG PO TABS: 5.0000 mg | ORAL_TABLET | Freq: Every day | ORAL | 2 refills | Status: DC

## 2017-12-14 NOTE — Assessment & Plan Note (Signed)
H/O of symptomatic anemia. He does not look anemic today in the office. He recently had labs by his PCP and is to f/u today with him.

## 2017-12-14 NOTE — Assessment & Plan Note (Signed)
Moderate AS at last echo march 2018

## 2017-12-14 NOTE — Assessment & Plan Note (Signed)
He is in AF today- despite Amiodarone 200 mg BID

## 2017-12-14 NOTE — Assessment & Plan Note (Signed)
GFR 39 

## 2017-12-14 NOTE — Patient Instructions (Addendum)
Medication Instructions:  1. DECREASE Toprol to 25mg  take 1 tablet once a day 2. DECREASE Norvasc to 5mg  take 1 tablet once a day 3. DISCONTINUE Hydralazine  Labwork: None   Testing/Procedures: None   Follow-Up: FOLLOW UP AS SCHEDULED WITH DR Tresa EndoKELLY  Any Other Special Instructions Will Be Listed Below (If Applicable). If you need a refill on your cardiac medications before your next appointment, please call your pharmacy.

## 2017-12-14 NOTE — Assessment & Plan Note (Signed)
He is in the office today with complaints of exertional angina-despite maximal medical rx

## 2017-12-14 NOTE — Progress Notes (Addendum)
12/14/2017 Jorge Cherry   [birth year]  [id number]  Primary Physician Chilton Greathouse, MD Primary Cardiologist: Dr Tresa Endo  HPI:  82 y/o male followed by Dr Tresa Endo with a history of CAD dating back to 1994 when he had CABG. In 2003 he had a DES to the SVG-Dx. His last cath was March 2018 and at that time he had a patent LIMA-LAD, Patent stent in the SVG-Dx, patent SVG-Dx2, and an occluded SVG-RCA. There were Lt-Rt collaterals noted. The plan has been for medical Rx. His LVF is preserved- 60-65% by echo March 2018. He does have moderate AS. Other medical issues include CRI-3, PAF, OSA, HTN, HLD, and a history of anemia.   He is in the office today because he has noticed increasing exertional angina and intermittent low B/P readings. He says his B/P at home has been less then 100 systolic at times and he has stopped taking his Toprol because of this. He gets chest pain- mid sternal pressure- when carrying anything more than 3-5 lbs. His symptoms are relieved with rest and on occasion he has taken SL NTG. He denies any associated nausea vomiting or diaphoresis.   Current Outpatient Medications  Medication Sig Dispense Refill  . albuterol (PROAIR HFA) 108 (90 Base) MCG/ACT inhaler INHALE TWO PUFFS EVERY 4-6 HOURS AS NEEDED FOR COUGH OR WHEEZE 1 Inhaler 1  . amiodarone (PACERONE) 200 MG tablet Take 1 tablet (200 mg total) by mouth 2 (two) times daily. 180 tablet 3  . amLODipine (NORVASC) 5 MG tablet Take 1 tablet (5 mg total) by mouth daily. 30 tablet 2  . aspirin EC 81 MG tablet Take 1 tablet (81 mg total) by mouth every evening. 30 tablet 0  . atorvastatin (LIPITOR) 20 MG tablet Take 20 mg by mouth every evening.     . Azelastine-Fluticasone 137-50 MCG/ACT SUSP Place 2 sprays into the nose daily as needed (allergies).    . Cholecalciferol (VITAMIN D-3 PO) Take 1,000 Units by mouth every evening.     [name] ELIQUIS 5 MG TABS tablet TAKE 1 TABLET BY MOUTH TWICE A DAY 180 tablet 1  . ezetimibe (ZETIA) 10  MG tablet TAKE 1 TABLET (10 MG TOTAL) BY MOUTH DAILY. 90 tablet 2  . fenofibrate (TRICOR) 145 MG tablet Take 72.5 mg by mouth daily.     . fluticasone (FLOVENT HFA) 110 MCG/ACT inhaler Inhale 2 puffs into the lungs as needed.     [name] ipratropium (ATROVENT) 0.06 % nasal spray Can use two sprays in each nostril every six hours as needed to dry up nose. 15 mL 5  . isosorbide mononitrate (IMDUR) 60 MG 24 hr tablet TAKE 1.5 TABLETS IN THE AM AND 1/2 TABLET IN THE PM 60 tablet 9  . loratadine (CLARITIN) 10 MG tablet Take 10 mg by mouth every evening.     . metoprolol succinate (TOPROL-XL) 25 MG 24 hr tablet Take 1 tablet (25 mg total) by mouth daily. 30 tablet 2  . nitroGLYCERIN (NITROLINGUAL) 0.4 MG/SPRAY spray Place 1 spray under the tongue every 5 (five) minutes x 3 doses as needed for chest pain. 12 g 0  . Omega-3 Fatty Acids (FISH OIL) 1000 MG CAPS Take 1 capsule by mouth 2 (two) times daily.     . pantoprazole (PROTONIX) 40 MG tablet Take 1 tablet (40 mg total) by mouth 2 (two) times daily. 60 tablet 5  . ranolazine (RANEXA) 1000 MG SR tablet Take 1 tablet (1,000 mg total) by mouth 2 (two)  times daily. 180 tablet 3  . telmisartan (MICARDIS) 20 MG tablet Take 20 mg by mouth daily.     [name]. torsemide (DEMADEX) 10 MG tablet Take 10 mg by mouth daily.    . vitamin C (ASCORBIC ACID) 500 MG tablet Take 500 mg by mouth daily.    . vitamin E 400 UNIT capsule Take 400 Units by mouth daily.    [name]. zinc gluconate 50 MG tablet Take 50 mg by mouth 2 (two) times a week.     No current facility-administered medications for this visit.     Allergies  Allergen Reactions  . Altace [Ramipril] Other (See Comments)    Mouth swelling  . Mucinex [Guaifenesin Er] Hives and Other (See Comments)    Mouth swelling  . Contrast Media [Iodinated Diagnostic Agents] Other (See Comments)    Made eyes change each time    Past Medical History:  Diagnosis Date  . Anemia   . Aortic valve disorder    2D ECHO, 10/19/2011 - EF  >55%, LA mild-moderately dilated, mild-moderate tricuspid regurgitation, mild-moderate valvular aortic stenosis, moderate calcification of aortic valve leaflets  . Arthritis    "just a touch in my hands" (11/24/2016)  . Atherosclerosis of renal artery (HCC)    RENAL DOPPLER, 12/10/2011 - Left renal artery demonstrated narrowing with elevated velocities consistent with a 1-59% diameter reduction  . Atrial fibrillation (HCC)   . CKD (chronic kidney disease) stage 3, GFR 30-59 ml/min (HCC)    "stable now since they backed off the water pills" (11/24/2016)  . Coronary artery disease    a. s/p CABG in 1994 b. s/p PCI with DES to SVG-D1 in 2003 c. low-risk NST in 01/2015  . GERD (gastroesophageal reflux disease)   . Heart murmur   . High cholesterol   . History of lower GI bleeding    "diverticulitis"  . Hypertension   . PAF (paroxysmal atrial fibrillation) (HCC)    a. on Eliquis    Social History   Socioeconomic History  . Marital status: Married    Spouse name: Not on file  . Number of children: 2  . Years of education: Not on file  . Highest education level: Not on file  Occupational History  . Not on file  Social Needs  . Financial resource strain: Not on file  . Food insecurity:    Worry: Not on file    Inability: Not on file  . Transportation needs:    Medical: Not on file    Non-medical: Not on file  Tobacco Use  . Smoking status: Never Smoker  . Smokeless tobacco: Never Used  Substance and Sexual Activity  . Alcohol use: No    Alcohol/week: 0.0 oz  . Drug use: No  . Sexual activity: Not Currently  Lifestyle  . Physical activity:    Days per week: Not on file    Minutes per session: Not on file  . Stress: Not on file  Relationships  . Social connections:    Talks on phone: Not on file    Gets together: Not on file    Attends religious service: Not on file    Active member of club or organization: Not on file    Attends meetings of clubs or organizations: Not on  file    Relationship status: Not on file  . Intimate partner violence:    Fear of current or ex partner: Not on file    Emotionally abused: Not on file  Physically abused: Not on file    Forced sexual activity: Not on file  Other Topics Concern  . Not on file  Social History Narrative   Lives at home with wife.  Retired from [employer].       Family History  Problem Relation Age of Onset  . Cancer Mother   . Heart disease Father        died age 34  . Stroke Maternal Grandmother   . Cancer Maternal Grandfather      Review of Systems: General: negative for chills, fever, night sweats or weight changes.  HOH Cardiovascular: negative for edema, orthopnea, palpitations, paroxysmal nocturnal dyspnea or shortness of breath Dermatological: negative for rash Respiratory: negative for cough or wheezing Urologic: negative for hematuria Abdominal: negative for nausea, vomiting, diarrhea, bright red blood per rectum, melena, or hematemesis Neurologic: negative for visual changes, syncope, or dizziness All other systems reviewed and are otherwise negative except as noted above.    Blood pressure 122/63, pulse 75, height 5\' 8"  (1.727 m), weight 206 lb 9.6 oz (93.7 kg).  General appearance: alert, cooperative and no distress Neck: no JVD and transmitted murmur Lungs: clear to auscultation bilaterally Heart: irregularly irregular rhythm and 2-3/6 honking systolic murmur AOV, LSB Extremities: extremities normal, atraumatic, no cyanosis or edema Skin: Skin color, texture, turgor normal. No rashes or lesions Neurologic: Grossly normal  EKG AF with VR 63, RBBB, LAFB, QTc 519  ASSESSMENT AND PLAN:   Exertional angina (HCC) He is in the office today with complaints of exertional angina-despite maximal medical rx  Coronary artery disease with exertional angina [facility]) Prior CABG and PCI, last cath march 2018- medical Rx  Paroxysmal atrial fibrillation (HCC) He is in AF  today- despite Amiodarone 200 mg BID, rate 63  Aortic valve stenosis Moderate AS at last echo march 2018  Chronic renal insufficiency, stage III (moderate) GFR 39  Essential hypertension Now having episodic hypotension- will decrease his medications  Chronic anticoagulation Eliquis 5 mg BID  Symptomatic anemia H/O of symptomatic anemia. He does not look anemic today in the office. He recently had labs by his PCP and is to f/u today with him.    PLAN  With CRI and diffuse occlusion of his native RCA and SVG to RCA will try medical Rx. Resume Toprol but at a lower dose- 25 mg daily. Stop Hydralazine. Decrease Amlodipine to 5 mg daily.  Keep follow up with Dr Tresa Endo 01/26/18. He knows to go to [facility] if his symptoms become worse or he has rest chest pain. I refilled his SL NTG.   Corine Shelter PA-C 12/14/2017 10:12 AM   12/16/17- Labs from [facility] reviewed. Hgb 9.8, CRI-3 unchanged, TSH WNL- no change in Rx. Corine Shelter PA-C 12/16/2017 8:56 AM

## 2017-12-14 NOTE — Assessment & Plan Note (Signed)
Now having episodic hypotension- will decrease his medications

## 2017-12-14 NOTE — Assessment & Plan Note (Signed)
Prior CABG and PCI, last cath march 2018- medical Rx

## 2017-12-14 NOTE — Assessment & Plan Note (Signed)
Eliquis 5 mg BID 

## 2017-12-15 ENCOUNTER — Telehealth: Payer: Self-pay | Admitting: Cardiovascular Disease

## 2017-12-15 NOTE — Telephone Encounter (Signed)
Received records from [facility] on 12/15/17, Appt 01/26/18 @ 4:00PM. [place]

## 2017-12-23 ENCOUNTER — Other Ambulatory Visit (HOSPITAL_COMMUNITY): Payer: Self-pay

## 2017-12-24 ENCOUNTER — Ambulatory Visit (HOSPITAL_COMMUNITY)
Admission: RE | Admit: 2017-12-24 | Discharge: 2017-12-24 | Disposition: A | Discharge status: Home / Self Care | Payer: Medicare Other | Source: Ambulatory Visit | Attending: Internal Medicine | Admitting: Internal Medicine

## 2017-12-24 DIAGNOSIS — D509 Iron deficiency anemia, unspecified: Secondary | ICD-10-CM | POA: Diagnosis present

## 2017-12-24 MED ORDER — FERUMOXYTOL INJECTION 510 MG/17 ML
510.0000 mg | INTRAVENOUS | Status: DC
  Administered 2017-12-24: 510 mg via INTRAVENOUS
  Filled 2017-12-24: qty 17

## 2017-12-24 NOTE — Discharge Instructions (Signed)

## 2017-12-24 NOTE — Progress Notes (Signed)
Called and left a message with Dr Vicente MalesAVVA's assistant to call the pt and let him know if Dr Felipa EthAvva wants him to have his second dose of feraheme next week or not since he had a reaction.  Also told the pt and his wife if they did not hear from Dr Lanier ClamAvvas office by Monday to call them and I gave the wife the phone number to the office.

## 2017-12-24 NOTE — Progress Notes (Signed)
Pt post feraheme infusion with c/o "feeling like I am going to faint", and headache. Dr Felipa EthAvva notified, observe pt for another 15 minutes and if symptoms are not getting better have pt call and schedule to be seen by NP this afternoon.  Will continue to monitor.

## 2017-12-31 ENCOUNTER — Ambulatory Visit (HOSPITAL_COMMUNITY)
Admission: RE | Admit: 2017-12-31 | Discharge: 2017-12-31 | Disposition: A | Discharge status: Home / Self Care | Payer: Medicare Other | Source: Ambulatory Visit | Attending: Internal Medicine | Admitting: Internal Medicine

## 2017-12-31 DIAGNOSIS — D649 Anemia, unspecified: Secondary | ICD-10-CM | POA: Insufficient documentation

## 2017-12-31 MED ORDER — SODIUM CHLORIDE 0.9 % IV SOLN
510.0000 mg | INTRAVENOUS | Status: AC
  Administered 2017-12-31: 510 mg via INTRAVENOUS
  Filled 2017-12-31: qty 17

## 2018-01-08 ENCOUNTER — Other Ambulatory Visit: Payer: Self-pay | Admitting: Cardiology

## 2018-01-18 ENCOUNTER — Ambulatory Visit (INDEPENDENT_AMBULATORY_CARE_PROVIDER_SITE_OTHER): Payer: Medicare Other | Admitting: Allergy and Immunology

## 2018-01-18 ENCOUNTER — Encounter: Payer: Self-pay | Admitting: Allergy and Immunology

## 2018-01-18 VITALS — BP 156/100 | HR 64 | Resp 18

## 2018-01-18 DIAGNOSIS — J4520 Mild intermittent asthma, uncomplicated: Secondary | ICD-10-CM | POA: Diagnosis not present

## 2018-01-18 DIAGNOSIS — K219 Gastro-esophageal reflux disease without esophagitis: Secondary | ICD-10-CM

## 2018-01-18 DIAGNOSIS — J3089 Other allergic rhinitis: Secondary | ICD-10-CM | POA: Diagnosis not present

## 2018-01-18 DIAGNOSIS — J4530 Mild persistent asthma, uncomplicated: Secondary | ICD-10-CM | POA: Diagnosis not present

## 2018-01-18 MED ORDER — ALBUTEROL SULFATE HFA 108 (90 BASE) MCG/ACT IN AERS: INHALATION_SPRAY | RESPIRATORY_TRACT | 1 refills | Status: DC

## 2018-01-18 MED ORDER — PANTOPRAZOLE SODIUM 40 MG PO TBEC: 40.0000 mg | DELAYED_RELEASE_TABLET | Freq: Two times a day (BID) | ORAL | 5 refills | Status: DC

## 2018-01-18 MED ORDER — IPRATROPIUM BROMIDE 0.06 % NA SOLN: NASAL | 5 refills | Status: DC

## 2018-01-18 NOTE — Patient Instructions (Addendum)
  1. Continue action plan for asthma flare including Flovent 110 - 3 inhalations 3 times a day  2. Continue ProAir HFA and antihistamine if needed  3. Continue pantoprazole 40 mg daily  4. Eliminate all caffeine consumption as possible  5. Can use OTC nasal saline if needed  6. Return to clinic in 12 months or earlier if problem   7. Obtain fall flu vaccine

## 2018-01-18 NOTE — Progress Notes (Signed)
Follow-up Note  Referring Provider: Chilton Greathouse, MD Primary Provider: Chilton Greathouse, MD Date of Office Visit: 01/18/2018  Subjective:   Jorge Cherry (DOB: Sep 21, 1935) is a 82 y.o. male who [facility] in re-evaluation of the following:  HPI: Carney Bern returns to this clinic in reevaluation of his allergic rhinitis and intermittent asthma and reflux with LPR.  I have not seen him in this clinic in over a year.  He has really done well with his nose.  He has stopped his Dymista and does not use any nasal ipratropium and has been using some eucalyptus to treat his nose.  He feels as though his nose is open and he can smell and has no complaints regarding his upper airway at this point.  He has not had any significant issues with asthma and has not used a short acting bronchodilator and has not had to activate his action plan.  He has not required a systemic steroid or antibiotic to treat any type of respiratory tract issue.  His reflux is under excellent control at this point on a proton pump inhibitor.  He has very little issues with his throat at this point.  He remains away from caffeine consumption except for an occasional ice tea about 1 time per week.  He has very significant coronary artery disease and is under the care of Dr. Tresa Endo.  He does have exertional angina quite commonly.  Allergies as of 01/18/2018      Reactions   Altace [ramipril] Other (See Comments)   Mouth swelling   Mucinex [guaifenesin Er] Hives, Other (See Comments)   Mouth swelling   Contrast Media [iodinated Diagnostic Agents] Other (See Comments)   Made eyes change each time      Medication List      albuterol 108 (90 Base) MCG/ACT inhaler Commonly known as:  PROVENTIL HFA;VENTOLIN HFA INHALE TWO PUFFS EVERY 4-6 HOURS AS NEEDED FOR COUGH OR WHEEZE   amiodarone 200 MG tablet Commonly known as:  PACERONE Take 1 tablet (200 mg total) by mouth 2 (two)  times daily.   amLODipine 5 MG tablet Commonly known as:  NORVASC Take 1 tablet (5 mg total) by mouth daily.   aspirin EC 81 MG tablet Take 1 tablet (81 mg total) by mouth every evening.   atorvastatin 20 MG tablet Commonly known as:  LIPITOR Take 20 mg by mouth every evening.   ELIQUIS 5 MG Tabs tablet Generic drug:  apixaban TAKE 1 TABLET BY MOUTH TWICE A DAY   ezetimibe 10 MG tablet Commonly known as:  ZETIA TAKE 1 TABLET (10 MG TOTAL) BY MOUTH DAILY.   fenofibrate 145 MG tablet Commonly known as:  TRICOR Take 72.5 mg by mouth daily.   Fish Oil 1000 MG Caps Take 1 capsule by mouth 2 (two) times daily.   fluticasone 110 MCG/ACT inhaler Commonly known as:  FLOVENT HFA Inhale 2 puffs into the lungs as needed.   ipratropium 0.06 % nasal spray Commonly known as:  ATROVENT Can use two sprays in each nostril every six hours as needed to dry up nose.   isosorbide mononitrate 60 MG 24 hr tablet Commonly known as:  IMDUR TAKE 1.5 TABLETS IN THE AM AND 1/2 TABLET IN THE PM   loratadine 10 MG tablet Commonly known as:  CLARITIN Take 10 mg by mouth every evening.   metoprolol succinate 25 MG 24 hr tablet Commonly known as:  TOPROL-XL Take  1 tablet (25 mg total) by mouth daily.   nitroGLYCERIN 0.4 MG/SPRAY spray Commonly known as:  NITROLINGUAL PLACE 1 SPRAY UNDER THE TONGUE EVERY 5 (FIVE) MINUTES X 3 DOSES AS NEEDED FOR CHEST PAIN.   pantoprazole 40 MG tablet Commonly known as:  PROTONIX Take 1 tablet (40 mg total) by mouth 2 (two) times daily.   ranolazine 1000 MG SR tablet Commonly known as:  RANEXA Take 1 tablet (1,000 mg total) by mouth 2 (two) times daily.   telmisartan 20 MG tablet Commonly known as:  MICARDIS Take 40 mg by mouth daily.   torsemide 10 MG tablet Commonly known as:  DEMADEX Take 10 mg by mouth daily.   vitamin C 500 MG tablet Commonly known as:  ASCORBIC ACID Take 500 mg by mouth daily.   VITAMIN D-3 PO Take 1,000 Units by mouth  every evening.   vitamin E 400 UNIT capsule Take 400 Units by mouth daily.   zinc gluconate 50 MG tablet Take 50 mg by mouth 2 (two) times a week.       Past Medical History:  Diagnosis Date  . Anemia   . Aortic valve disorder    2D ECHO, 10/19/2011 - EF >55%, LA mild-moderately dilated, mild-moderate tricuspid regurgitation, mild-moderate valvular aortic stenosis, moderate calcification of aortic valve leaflets  . Arthritis    "just a touch in my hands" (11/24/2016)  . Atherosclerosis of renal artery (HCC)    RENAL DOPPLER, 12/10/2011 - Left renal artery demonstrated narrowing with elevated velocities consistent with a 1-59% diameter reduction  . Atrial fibrillation (HCC)   . CKD (chronic kidney disease) stage 3, GFR 30-59 ml/min (HCC)    "stable now since they backed off the water pills" (11/24/2016)  . Coronary artery disease    a. s/p CABG in 1994 b. s/p PCI with DES to SVG-D1 in 2003 c. low-risk NST in 01/2015  . GERD (gastroesophageal reflux disease)   . Heart murmur   . High cholesterol   . History of lower GI bleeding    "diverticulitis"  . Hypertension   . PAF (paroxysmal atrial fibrillation) (HCC)    a. on Eliquis    Past Surgical History:  Procedure Laterality Date  . CARDIAC CATHETERIZATION  02/27/2004   Coronary intervention and medical management  . CARDIAC CATHETERIZATION  11/24/2016  . CATARACT EXTRACTION W/ INTRAOCULAR LENS IMPLANT Left   . CORONARY ANGIOPLASTY  1994 X 2   "before bypass surgery"  . CORONARY ANGIOPLASTY WITH STENT PLACEMENT  03/04/2004   SVG supplying the diagonal vessel stented with a 3.5x62mm Taxus stent post dilated to 4.0 mm  . CORONARY ARTERY BYPASS GRAFT  1994   "CABG X4"  . INGUINAL HERNIA REPAIR Right   . RIGHT HEART CATH AND CORONARY/GRAFT ANGIOGRAPHY N/A 11/24/2016   Procedure: Right Heart Cath and Coronary/Graft Angiography;  Surgeon: Lennette Bihari, MD;  Location: [facility] INVASIVE CV LAB;  Service: Cardiovascular;  Laterality: N/A;    . TONSILLECTOMY AND ADENOIDECTOMY      Review of systems negative except as noted in HPI / PMHx or noted below:  Review of Systems  Constitutional: Negative.   HENT: Negative.   Eyes: Negative.   Respiratory: Negative.   Cardiovascular: Negative.   Gastrointestinal: Negative.   Genitourinary: Negative.   Musculoskeletal: Negative.   Skin: Negative.   Neurological: Negative.   Endo/Heme/Allergies: Negative.   Psychiatric/Behavioral: Negative.      Objective:   Vitals:   01/18/18 1110 01/18/18 1135  BP: [name])  170/110 (!) 156/100  Pulse: 64   Resp: 18   SpO2: 98%           Physical Exam  HENT:  Head: Normocephalic. Head is without right periorbital erythema and without left periorbital erythema.  Right Ear: External ear normal.  Left Ear: External ear normal.  Nose: Nose normal. No mucosal edema or rhinorrhea.  Mouth/Throat: Oropharynx is clear and moist and mucous membranes are normal. No oropharyngeal exudate.  Bilateral hearing aids  Eyes: Pupils are equal, round, and reactive to light. Conjunctivae and lids are normal.  Neck: Trachea normal. No tracheal deviation present. No thyromegaly present.  Cardiovascular: Normal rate, regular rhythm, S1 normal and S2 normal.  Murmur (Systolic) heard. Pulmonary/Chest: Effort normal. No stridor. No respiratory distress. He has no wheezes. He has no rales. He exhibits no tenderness.  Abdominal: Soft. He exhibits no distension and no mass. There is no hepatosplenomegaly. There is no tenderness. There is no rebound and no guarding.  Musculoskeletal: He exhibits no edema or tenderness.  Lymphadenopathy:       Head (right side): No tonsillar adenopathy present.       Head (left side): No tonsillar adenopathy present.    He has no cervical adenopathy.    He has no axillary adenopathy.  Neurological: He is alert.  Skin: No rash noted. He is not diaphoretic. No erythema. No pallor. Nails show no clubbing.    Diagnostics:     Spirometry was performed and demonstrated an FEV1 of 2.95 at 110 % of predicted.  The patient had an Asthma Control Test with the following results: ACT Total Score: 25.    Assessment and Plan:   1. Asthma, mild intermittent, well-controlled   2. Perennial allergic rhinitis   3. LPRD (laryngopharyngeal reflux disease)   4. Mild persistent asthma, uncomplicated     1. Continue action plan for asthma flare including Flovent 110 - 3 inhalations 3 times a day  2. Continue ProAir HFA and antihistamine if needed  3. Continue pantoprazole 40 mg daily  4. Eliminate all caffeine consumption as possible  5. Can use OTC nasal saline if needed  6. Return to clinic in 12 months or earlier if problem   7. Obtain fall flu vaccine  Carney Bern appears to be doing relatively well at this point.  His big issue appears to be cardiac in nature and he will follow-up with Dr. Tresa Endo regarding this issue.  He will continue on the plan of therapy noted above and I will see him back in this clinic in 1 year or earlier if there is a problem.  Laurette Schimke, MD Allergy / Immunology [facility]

## 2018-01-19 ENCOUNTER — Encounter: Payer: Self-pay | Admitting: Allergy and Immunology

## 2018-01-26 ENCOUNTER — Ambulatory Visit: Payer: Medicare Other | Admitting: Cardiovascular Disease

## 2018-01-26 ENCOUNTER — Encounter: Payer: Self-pay | Admitting: Cardiovascular Disease

## 2018-01-26 VITALS — BP 156/74 | HR 47 | Ht 68.0 in | Wt 202.0 lb

## 2018-01-26 DIAGNOSIS — I350 Nonrheumatic aortic (valve) stenosis: Secondary | ICD-10-CM | POA: Diagnosis not present

## 2018-01-26 DIAGNOSIS — N183 Chronic kidney disease, stage 3 unspecified: Secondary | ICD-10-CM

## 2018-01-26 DIAGNOSIS — I2510 Atherosclerotic heart disease of native coronary artery without angina pectoris: Secondary | ICD-10-CM

## 2018-01-26 DIAGNOSIS — Z951 Presence of aortocoronary bypass graft: Secondary | ICD-10-CM | POA: Diagnosis not present

## 2018-01-26 DIAGNOSIS — Z7901 Long term (current) use of anticoagulants: Secondary | ICD-10-CM

## 2018-01-26 DIAGNOSIS — I480 Paroxysmal atrial fibrillation: Secondary | ICD-10-CM

## 2018-01-26 DIAGNOSIS — I25118 Atherosclerotic heart disease of native coronary artery with other forms of angina pectoris: Secondary | ICD-10-CM

## 2018-01-26 DIAGNOSIS — I10 Essential (primary) hypertension: Secondary | ICD-10-CM

## 2018-01-26 DIAGNOSIS — I208 Other forms of angina pectoris: Secondary | ICD-10-CM

## 2018-01-26 DIAGNOSIS — I2089 Other forms of angina pectoris: Secondary | ICD-10-CM

## 2018-01-26 DIAGNOSIS — G4733 Obstructive sleep apnea (adult) (pediatric): Secondary | ICD-10-CM

## 2018-01-26 MED ORDER — ISOSORBIDE MONONITRATE ER 60 MG PO TB24: ORAL_TABLET | ORAL | 3 refills | Status: DC

## 2018-01-26 NOTE — Progress Notes (Signed)
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 is excellent at 0.9.  He does have a mask leak.  IMPRESSION:  1. Aortic valve stenosis, etiology of cardiac valve disease unspecified   2. CAD in native artery   3. Hx of CABG   4. Exertional angina (HCC)   5. Essential hypertension   6. OSA (obstructive sleep apnea)   7. Paroxysmal atrial fibrillation (HCC)   8. Anticoagulation adequate   9. Stage III chronic kidney disease ([place])     ASSESSMENT AND PLAN: Jorge Cherry is a 82 year old white male who is status post CABG revascularization surgery in 1994.  He is status post DES stenting to the graft supplying the diagonal vessel in 2003.  In July 2017 he was hospitalized with atrial fibrillation in the setting of bradycardia and hypotension leading to medication adjustment  An echo Doppler study of 11/21/2015 showed an EF of 55-60%, mild aortic stenosis with a valve area of 1.75 mm per there was mild pulmonary hypertension. Subsequently he had complained of experiencing some episodes of chest tightness and his symptoms improved with further titration of nitrates as well as beta blocker therapy.  He developed significant anemia, which undoubtedly exacerbated his anginal symptomatology.  At his most recent catheterization in July 2018 although  his LIMA to mid LAD and vein graft to diagonal vessel are patent the very proximal LAD had a 95% stenosis which supplies a diagonal vessel, which is not supplied by the grafts.  His native RCA and graft to RCA is occluded and there is some collateralization to the distal RCA via the vein graft supplying the circumflex marginal vessel.  His anginal symptoms have improved with stabilization of his hemoglobin.  Spironolactone was discontinued secondary to renal insufficiency.  Since his last catheterization he has undergone medication titration due to class II anginal symptomatology.  His blood pressure today is mildly increased on his regimen consisting of amlodipine 5 mg, isosorbide 90 mg in the morning and 30 mg at night, Toprol-XL 25 mg daily in addition to telmisartan 40 mg.  He continues to be on ranolazine 500 mg twice a day in addition to his isosorbide, clozapine and beta-blocker therapy for his class II angina.  I have recommended slight titration of his isosorbide mononitrate such that he will increase the morning dose to  120 mg but continue to take the evening dose at 30 mg.  He continues to be on combination therapy with atorvastatin and Zetia with target LDL less than 70.  He is tolerating this well.  He is followed by Dr. Dagmar Hait at [facility] for primary care.  I reviewed his CPAP download.  He is compliant and AHI is excellent at 0.9 although mask leak was present and he may need adjustment to his mask and/or cushion.  His physical exam demonstrates his aortic stenosis murmur.  His last echo Doppler study was March 2018.  I have suggested a follow-up echocardiographic evaluation for reassessment.  With reference to his PAF, his ECG today shows sinus bradycardia.  He continues to be on anticoagulation without bleeding.  I will see him back in the office in 3 or 4 months for follow-up evaluation.  Time spent: 25 minutes Troy Sine, MD, [facility]  01/29/2018 4:43 PM

## 2018-01-26 NOTE — Patient Instructions (Addendum)
Medication Instructions:  INCREASE isosorbide (Imdur) to 120 mg in the AM and 30 mg in the PM  Testing/Procedures: Your physician has requested that you have an echocardiogram in 3 months. Echocardiography is a painless test that uses sound waves to create images of your heart. It provides your doctor with information about the size and shape of your heart and how well your heart's chambers and valves are working. This procedure takes approximately one hour. There are no restrictions for this procedure.  [facility]  [facility]  Follow-Up: 3-4 months with Dr. Tresa EndoKelly (after echo)  Any Other Special Instructions Will Be Listed Below (If Applicable).     If you need a refill on your cardiac medications before your next appointment, please call your pharmacy.

## 2018-01-29 ENCOUNTER — Encounter: Payer: Self-pay | Admitting: Cardiovascular Disease

## 2018-02-10 ENCOUNTER — Emergency Department (HOSPITAL_COMMUNITY)
Admission: EM | Admit: 2018-02-10 | Discharge: 2018-02-10 | Disposition: A | Discharge status: Home / Self Care | Payer: Medicare Other | Attending: Emergency Medicine | Admitting: Emergency Medicine

## 2018-02-10 ENCOUNTER — Encounter (HOSPITAL_COMMUNITY): Payer: Self-pay | Admitting: Emergency Medicine

## 2018-02-10 ENCOUNTER — Other Ambulatory Visit: Payer: Self-pay

## 2018-02-10 DIAGNOSIS — J45909 Unspecified asthma, uncomplicated: Secondary | ICD-10-CM | POA: Diagnosis not present

## 2018-02-10 DIAGNOSIS — I10 Essential (primary) hypertension: Secondary | ICD-10-CM

## 2018-02-10 DIAGNOSIS — I130 Hypertensive heart and chronic kidney disease with heart failure and stage 1 through stage 4 chronic kidney disease, or unspecified chronic kidney disease: Secondary | ICD-10-CM | POA: Diagnosis present

## 2018-02-10 DIAGNOSIS — I5032 Chronic diastolic (congestive) heart failure: Secondary | ICD-10-CM | POA: Insufficient documentation

## 2018-02-10 DIAGNOSIS — Z7982 Long term (current) use of aspirin: Secondary | ICD-10-CM | POA: Diagnosis not present

## 2018-02-10 DIAGNOSIS — I2510 Atherosclerotic heart disease of native coronary artery without angina pectoris: Secondary | ICD-10-CM | POA: Insufficient documentation

## 2018-02-10 DIAGNOSIS — N183 Chronic kidney disease, stage 3 (moderate): Secondary | ICD-10-CM | POA: Diagnosis not present

## 2018-02-10 DIAGNOSIS — Z7901 Long term (current) use of anticoagulants: Secondary | ICD-10-CM | POA: Insufficient documentation

## 2018-02-10 DIAGNOSIS — Z79899 Other long term (current) drug therapy: Secondary | ICD-10-CM | POA: Insufficient documentation

## 2018-02-10 LAB — I-STAT CHEM 8, ED
BUN: 30 mg/dL — AB (ref 8–23)
CREATININE: 1.7 mg/dL — AB (ref 0.61–1.24)
Calcium, Ion: 1.2 mmol/L (ref 1.15–1.40)
Chloride: 110 mmol/L (ref 98–111)
Glucose, Bld: 122 mg/dL — ABNORMAL HIGH (ref 70–99)
HEMATOCRIT: 35 % — AB (ref 39.0–52.0)
HEMOGLOBIN: 11.9 g/dL — AB (ref 13.0–17.0)
POTASSIUM: 3.9 mmol/L (ref 3.5–5.1)
SODIUM: 144 mmol/L (ref 135–145)
TCO2: 27 mmol/L (ref 22–32)

## 2018-02-10 NOTE — ED Triage Notes (Signed)
Patient took blood pressure last night 220/110. Denies chest pain or shortness of breath. States does take blood pressure for his high blood pressure and took it today. Patient stopped at a fire station and 189/97. In triage blood pressure 138/63. Alert answering and following commands appropriate.

## 2018-02-10 NOTE — ED Provider Notes (Signed)
[facility] EMERGENCY DEPARTMENT Provider Note   CSN: [id number] Arrival date & time: 02/10/18  1231     History   Chief Complaint Chief Complaint  Patient presents with  . Hypertension    HPI Jorge Cherry is a 82 y.o. male.  82 year old male presents to the emergency room due to elevated blood pressure readings without any symptoms or concerns otherwise.  Patient states that his blood pressure has been elevated for the past few days despite taking his blood pressure medication.  States his blood pressure will be elevated and then returned to normal and he is not sure why but states that he does not have any symptoms, he has just been checking his blood pressure frequently.  Specifically patient denies chest pain, headache, changes in vision, weakness, shortness of breath.  Is feeling well and at baseline status.     Past Medical History:  Diagnosis Date  . Anemia   . Aortic valve disorder    2D ECHO, 10/19/2011 - EF >55%, LA mild-moderately dilated, mild-moderate tricuspid regurgitation, mild-moderate valvular aortic stenosis, moderate calcification of aortic valve leaflets  . Arthritis    "just a touch in my hands" (11/24/2016)  . Atherosclerosis of renal artery (HCC)    RENAL DOPPLER, 12/10/2011 - Left renal artery demonstrated narrowing with elevated velocities consistent with a 1-59% diameter reduction  . Atrial fibrillation (HCC)   . CKD (chronic kidney disease) stage 3, GFR 30-59 ml/min (HCC)    "stable now since they backed off the water pills" (11/24/2016)  . Coronary artery disease    a. s/p CABG in 1994 b. s/p PCI with DES to SVG-D1 in 2003 c. low-risk NST in 01/2015  . GERD (gastroesophageal reflux disease)   . Heart murmur   . High cholesterol   . History of lower GI bleeding    "diverticulitis"  . Hypertension   . PAF (paroxysmal atrial fibrillation) (HCC)    a. on Eliquis    Patient Active Problem List   Diagnosis Date Noted  . Chronic  anticoagulation 12/14/2017  . Mild intermittent asthma without complication 07/22/2017  . LPRD (laryngopharyngeal reflux disease) 07/22/2017  . Anemia 11/25/2016  . Coronary artery disease with exertional angina (HCC) 11/24/2016  . Exertional angina (HCC)   . Chronic diastolic heart failure (HCC) 05/26/2016  . Junctional (nodal) bradycardia 11/20/2015  . Paroxysmal atrial fibrillation (HCC) 11/20/2015  . Hyperlipidemia LDL goal <70 05/03/2015  . Mild persistent asthma 03/19/2015  . Allergic rhinitis 03/19/2015  . Gastroesophageal reflux disease 03/19/2015  . Essential hypertension 02/07/2015  . Chest pain 01/22/2015  . Pain in the chest   . Chronic renal insufficiency, stage III (moderate) (HCC) 12/28/2014  . Lower GI bleed 09/24/2014  . Diverticulosis of colon with hemorrhage 09/24/2014  . Symptomatic anemia 09/24/2014  . Aortic valve stenosis 07/22/2013  . Hyperlipidemia with target LDL less than 70 10/27/2012  . Edema 10/27/2012  . Hearing decreased 10/27/2012  . Hx of CABG 10/27/2012  . Coronary atherosclerosis of native coronary artery 10/27/2012  . Benign hypertensive heart disease without heart failure 10/27/2012    Past Surgical History:  Procedure Laterality Date  . CARDIAC CATHETERIZATION  02/27/2004   Coronary intervention and medical management  . CARDIAC CATHETERIZATION  11/24/2016  . CATARACT EXTRACTION W/ INTRAOCULAR LENS IMPLANT Left   . CORONARY ANGIOPLASTY  1994 X 2   "before bypass surgery"  . CORONARY ANGIOPLASTY WITH STENT PLACEMENT  03/04/2004   SVG supplying the diagonal vessel stented  with a 3.5x51mm Taxus stent post dilated to 4.0 mm  . CORONARY ARTERY BYPASS GRAFT  1994   "CABG X4"  . INGUINAL HERNIA REPAIR Right   . RIGHT HEART CATH AND CORONARY/GRAFT ANGIOGRAPHY N/A 11/24/2016   Procedure: Right Heart Cath and Coronary/Graft Angiography;  Surgeon: Lennette Bihari, MD;  Location: [facility] INVASIVE CV LAB;  Service: Cardiovascular;  Laterality: N/A;  .  TONSILLECTOMY AND ADENOIDECTOMY          Home Medications    Prior to Admission medications   Medication Sig Start Date End Date Taking? Authorizing Provider  albuterol (PROAIR HFA) 108 (90 Base) MCG/ACT inhaler INHALE TWO PUFFS EVERY 4-6 HOURS AS NEEDED FOR COUGH OR WHEEZE 01/18/18   Kozlow, Alvira Philips, MD  amiodarone (PACERONE) 200 MG tablet Take 1 tablet (200 mg total) by mouth 2 (two) times daily. 08/26/17   Lennette Bihari, MD  amLODipine (NORVASC) 5 MG tablet Take 1 tablet (5 mg total) by mouth daily. 12/14/17   Abelino Derrick, PA-C  aspirin EC 81 MG tablet Take 1 tablet (81 mg total) by mouth every evening. 11/25/16   Arty Baumgartner, NP  atorvastatin (LIPITOR) 20 MG tablet Take 20 mg by mouth every evening.  12/23/15   [provider]  Cholecalciferol (VITAMIN D-3 PO) Take 1,000 Units by mouth every evening.     [provider]  ELIQUIS 5 MG TABS tablet TAKE 1 TABLET BY MOUTH TWICE A DAY 08/18/17   Lennette Bihari, MD  ezetimibe (ZETIA) 10 MG tablet TAKE 1 TABLET (10 MG TOTAL) BY MOUTH DAILY. 09/27/17   Lennette Bihari, MD  fenofibrate (TRICOR) 145 MG tablet Take 72.5 mg by mouth daily.  12/10/15   [provider]  ipratropium (ATROVENT) 0.06 % nasal spray Can use two sprays in each nostril every six hours as needed to dry up nose. 01/18/18   Kozlow, Alvira Philips, MD  isosorbide mononitrate (IMDUR) 60 MG 24 hr tablet Take 2 tablets (120 mg total) by mouth every morning AND 0.5 tablets (30 mg total) every evening. 01/26/18   Lennette Bihari, MD  loratadine (CLARITIN) 10 MG tablet Take 10 mg by mouth every evening.     [provider]  metoprolol succinate (TOPROL-XL) 25 MG 24 hr tablet Take 1 tablet (25 mg total) by mouth daily. 12/14/17   Kilroy, Eda Paschal, PA-C  nitroGLYCERIN (NITROLINGUAL) 0.4 MG/SPRAY spray PLACE 1 SPRAY UNDER THE TONGUE EVERY 5 (FIVE) MINUTES X 3 DOSES AS NEEDED FOR CHEST PAIN. 01/11/18   Abelino Derrick, PA-C  Omega-3 Fatty Acids (FISH OIL) 1000 MG CAPS  Take 1 capsule by mouth 2 (two) times daily.     [provider]  pantoprazole (PROTONIX) 40 MG tablet Take 1 tablet (40 mg total) by mouth 2 (two) times daily. 01/18/18   Kozlow, Alvira Philips, MD  ranolazine (RANEXA) 500 MG 12 hr tablet Take 500 mg by mouth 2 (two) times daily.    [provider]  telmisartan (MICARDIS) 20 MG tablet Take 40 mg by mouth daily.     [provider]  torsemide (DEMADEX) 10 MG tablet Take 10 mg by mouth daily.    [provider]  vitamin C (ASCORBIC ACID) 500 MG tablet Take 500 mg by mouth daily.    [provider]  vitamin E 400 UNIT capsule Take 400 Units by mouth daily.    [provider]  zinc gluconate 50 MG tablet Take 50 mg by mouth  2 (two) times a week.    [provider]    Family History Family History  Problem Relation Age of Onset  . Cancer Mother   . Heart disease Father        died age 36  . Stroke Maternal Grandmother   . Cancer Maternal Grandfather     Social History Social History   Tobacco Use  . Smoking status: Never Smoker  . Smokeless tobacco: Never Used  Substance Use Topics  . Alcohol use: No    Alcohol/week: 0.0 standard drinks  . Drug use: No     Allergies   Altace [ramipril]; Mucinex [guaifenesin er]; and Contrast media [iodinated diagnostic agents]   Review of Systems Review of Systems  Constitutional: Negative for fever.  Eyes: Negative for visual disturbance.  Respiratory: Negative for chest tightness and shortness of breath.   Cardiovascular: Negative for chest pain.  Gastrointestinal: Negative for abdominal pain, nausea and vomiting.  Neurological: Negative for dizziness, weakness and headaches.  Psychiatric/Behavioral: Negative for confusion.  All other systems reviewed and are negative.    Physical Exam Updated Vital Signs BP (!) 189/87   Pulse (!) 43   Temp 98.6 F (37 C) (Oral)   Resp 12   Ht 5\' 8"  (1.727 m)   Wt 93 kg   SpO2 96%   BMI  31.17 kg/m   Physical Exam  Constitutional: He is oriented to person, place, and time. He appears well-developed and well-nourished. No distress.  HENT:  Head: Normocephalic and atraumatic.  Cardiovascular: Normal rate, regular rhythm and normal heart sounds.  No murmur heard. Pulmonary/Chest: Effort normal and breath sounds normal.  Abdominal: Soft. There is no tenderness.  Neurological: He is alert and oriented to person, place, and time.  Skin: Skin is warm and dry. No rash noted. He is not diaphoretic.  Psychiatric: He has a normal mood and affect. His behavior is normal.  Nursing note and vitals reviewed.    ED Treatments / Results  Labs (all labs ordered are listed, but only abnormal results are displayed) Labs Reviewed  I-STAT CHEM 8, ED - Abnormal; Notable for the following components:      Result Value   BUN 30 (*)    Creatinine, Ser 1.70 (*)    Glucose, Bld 122 (*)    Hemoglobin 11.9 (*)    HCT 35.0 (*)    All other components within normal limits    EKG EKG Interpretation  Date/Time:  Thursday February 10 2018 12:36:59 EDT Ventricular Rate:  52 PR Interval:  206 QRS Duration: 138 QT Interval:  604 QTC Calculation: 561 R Axis:   -47 Text Interpretation:  Sinus bradycardia with Fusion complexes and Premature atrial complexes with Abberant conduction Left axis deviation Left ventricular hypertrophy with QRS widening Cannot rule out Septal infarct , age undetermined T wave abnormality, consider lateral ischemia Abnormal ECG Confirmed by Azalia Bilis ([id number]) on 02/10/2018 5:03:04 PM   Radiology No results found.  Procedures Procedures (including critical care time)  Medications Ordered in ED Medications - No data to display   Initial Impression / Assessment and Plan / ED Course  I have reviewed the triage vital signs and the nursing notes.  Pertinent labs & imaging results that were available during my care of the patient were reviewed by me and  considered in my medical decision making (see chart for details).  Clinical Course as of Feb 11 1751  Thu Feb 10, 2018  1712 Patient noted to be  hypertensive in the emergency department.  No signs of hypertensive urgency.  Discussed with patient the need for close follow-up and management by their primary care physician.  EKG reviewed with Dr. Patria Mane, ER attending, no further work up needed in asymptomatic patient.    [LM]    Clinical Course User Index [LM] Jeannie Fend, PA-C   Final Clinical Impressions(s) / ED Diagnoses   Final diagnoses:  Hypertension, unspecified type    ED Discharge Orders    None       Alden Hipp 02/10/18 Mickie Hillier, MD 02/11/18 [phone]

## 2018-02-10 NOTE — Discharge Instructions (Addendum)
Your cardiologist office for further advice regarding blood pressure management.  Return to ER for any symptoms such as chest pain, shortness of breath, dizziness or any other concerns.

## 2018-02-10 NOTE — ED Notes (Signed)
Patient verbalizes understanding of discharge instructions. Opportunity for questioning and answers were provided. Armband removed by staff, pt discharged from ED.  

## 2018-02-11 ENCOUNTER — Telehealth: Payer: Self-pay | Admitting: Cardiovascular Disease

## 2018-02-11 ENCOUNTER — Other Ambulatory Visit: Payer: Self-pay | Admitting: Cardiovascular Disease

## 2018-02-11 MED ORDER — AMLODIPINE BESYLATE 5 MG PO TABS: ORAL_TABLET | ORAL | 6 refills | Status: DC

## 2018-02-11 NOTE — Telephone Encounter (Signed)
Please increase amlodipine to 5mg  twice daily, stay hydrated and indoors (high temperatures may be playing a role in his elevated BP), monitor BP twice daily and keep records.  Okay to schedule visit with pharmacist HNT clinic for next available if patient agreeable.

## 2018-02-11 NOTE — Telephone Encounter (Signed)
Returned call to patient he stated his B/P has been elevated this week.B/P ranging 228/91,196/99,168/74.Pulse 50's.Advised I will send message to our Pharmacist for advice.

## 2018-02-11 NOTE — Telephone Encounter (Signed)
Returned call to patient's wife Raquel's recommendation given.Appointment scheduled with HTN clinic 02/22/18 at 9:00 am.Advised to bring all medications and B/P readings to appointment.

## 2018-02-11 NOTE — Telephone Encounter (Signed)
Pt c/o BP issue: STAT if pt c/o blurred vision, one-sided weakness or slurred speech  1. What are your last 5 BP readings? 198/99, 168/74  2. Are you having any other symptoms (ex. Dizziness, headache, blurred vision, passed out)? Fatigue   3. What is your BP issue?  Is there anything they can do for his high blood pressure.

## 2018-02-22 ENCOUNTER — Ambulatory Visit (INDEPENDENT_AMBULATORY_CARE_PROVIDER_SITE_OTHER): Payer: Medicare Other | Admitting: Pharmacist Clinician (PhC)/ Clinical Pharmacy Specialist

## 2018-02-22 ENCOUNTER — Encounter: Payer: Self-pay | Admitting: Pharmacist Clinician (PhC)/ Clinical Pharmacy Specialist

## 2018-02-22 DIAGNOSIS — I10 Essential (primary) hypertension: Secondary | ICD-10-CM | POA: Diagnosis not present

## 2018-02-22 MED ORDER — METOPROLOL SUCCINATE 25 MG PO CS24: 25.0000 mg | EXTENDED_RELEASE_CAPSULE | Freq: Two times a day (BID) | ORAL | 10 refills | Status: DC

## 2018-02-22 NOTE — Progress Notes (Signed)
02/22/2018 Jorge Cherry 30-Dec-1935 [id number]   HPI:  Jorge Cherry is a 82 y.o. male patient of Dr Tresa Endo, with a PMH below who presents today for hypertension clinic evaluation.  In addition to hypertension, his medical history is significant for CAD (with CABG I 1994, PCI with stenting in 2003), PAF, hyperlipidemia, bradycardia, aortic valve stenosis, and CKD (stage 3).  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  They are here today with a notebook of all his medications and adjustments that have been made over the past year.   There are some differences between his lists and what our medical record shows.  I was not able to take the time to go thru all of his non-cardiac medications and supplements.  On Oct 3 he went to the ED because of an elevated BP > 200/100.  He was monitored at ED, however he had no symptoms of chest pain, headache, vision problems, weakness or SOB.  He was therefore discharged and told to contact cardiology regarding potential changes to medications.  He was told by our office to increase his amlodipine to 5 mg twice daily and come in for CVRR visit.    Blood Pressure Goal:  130/80  Current Medications:  Amlodipine 5 mg bid  Metoprolol succ 25 mg bid (patient will hold evening dose for low HR)  telmisartan 40 mg qd  Family Hx:  Father died form MI at 57, know atherosclerosis  Mother died from cancer  1 brother - cancer  2 children, daughter had pre-eclampsia with pregnancy, now on BP meds  Social Hx:   No tobacco, no alcohol; decaf coffee, no other caffeine products  Diet:  Eats out regularly, no added salt, ([employer], sit down restaurants); plenty of vegetables  Exercise:  Angina develops easily with exertion  Home BP readings:  Did not bring readings or cuff.  Did not go over 200 after ED visit, then after 3-4 days decreased to 150-170 systolic.  One reading as low as 130 systolic.  Diastolic mostly in the 80's.  Home cuff  about 75-88 years old, Omron arm cuff.  Previously tried:   Spironolactone d/c CKD, inc SCr  Hydralazine d/c when BP was good on 8/6 visit with Corine Shelter.   Labs:  02/10/18:  Na 144, K 3.9, Glu 122, BUN 30, SCr 1.70 (CrCl 46.4) Wt Readings from Last 3 Encounters:  02/10/18 205 lb (93 kg)  01/26/18 202 lb (91.6 kg)  12/24/17 208 lb (94.3 kg)   BP Readings from Last 3 Encounters:  02/22/18 (!) 176/80  02/10/18 (!) 189/87  01/26/18 (!) 156/74   Pulse Readings from Last 3 Encounters:  02/22/18 (!) 48  02/10/18 (!) 43  01/26/18 (!) 47    Current Outpatient Medications  Medication Sig Dispense Refill  . albuterol (PROAIR HFA) 108 (90 Base) MCG/ACT inhaler INHALE TWO PUFFS EVERY 4-6 HOURS AS NEEDED FOR COUGH OR WHEEZE 1 Inhaler 1  . amiodarone (PACERONE) 200 MG tablet Take 1 tablet (200 mg total) by mouth 2 (two) times daily. 180 tablet 3  . amLODipine (NORVASC) 5 MG tablet Take 5 mg twice a day 60 tablet 6  . aspirin EC 81 MG tablet Take 1 tablet (81 mg total) by mouth every evening. 30 tablet 0  . atorvastatin (LIPITOR) 20 MG tablet Take 20 mg by mouth every evening.     . Cholecalciferol (VITAMIN D-3 PO) Take 1,000 Units by mouth every evening.     [name]  ELIQUIS 5 MG TABS tablet TAKE 1 TABLET BY MOUTH TWICE A DAY 180 tablet 1  . ezetimibe (ZETIA) 10 MG tablet TAKE 1 TABLET (10 MG TOTAL) BY MOUTH DAILY. 90 tablet 2  . fenofibrate (TRICOR) 145 MG tablet Take 72.5 mg by mouth daily.     [name] ipratropium (ATROVENT) 0.06 % nasal spray Can use two sprays in each nostril every six hours as needed to dry up nose. 15 mL 5  . isosorbide mononitrate (IMDUR) 60 MG 24 hr tablet Take 2 tablets (120 mg total) by mouth every morning AND 0.5 tablets (30 mg total) every evening. 225 tablet 3  . loratadine (CLARITIN) 10 MG tablet Take 10 mg by mouth every evening.     . Metoprolol Succinate 25 MG CS24 Take 25 mg by mouth 2 (two) times daily. 30 capsule 10  . nitroGLYCERIN (NITROLINGUAL) 0.4 MG/SPRAY spray  PLACE 1 SPRAY UNDER THE TONGUE EVERY 5 (FIVE) MINUTES X 3 DOSES AS NEEDED FOR CHEST PAIN. 1 g 2  . Omega-3 Fatty Acids (FISH OIL) 1000 MG CAPS Take 1 capsule by mouth 2 (two) times daily.     . pantoprazole (PROTONIX) 40 MG tablet Take 1 tablet (40 mg total) by mouth 2 (two) times daily. 60 tablet 5  . ranolazine (RANEXA) 500 MG 12 hr tablet Take 500 mg by mouth 2 (two) times daily.    [name] telmisartan (MICARDIS) 80 MG tablet Take 1/2 tablet daily 30 tablet 6  . torsemide (DEMADEX) 20 MG tablet Take 1/2 tablet daily 180 tablet 3  . vitamin C (ASCORBIC ACID) 500 MG tablet Take 500 mg by mouth daily.    . vitamin E 400 UNIT capsule Take 400 Units by mouth daily.    [name] zinc gluconate 50 MG tablet Take 50 mg by mouth 2 (two) times a week.     No current facility-administered medications for this visit.     Allergies  Allergen Reactions  . Altace [Ramipril] Other (See Comments)    Mouth swelling  . Mucinex [Guaifenesin Er] Hives and Other (See Comments)    Mouth swelling  . Contrast Media [Iodinated Diagnostic Agents] Other (See Comments)    Made eyes change each time    Past Medical History:  Diagnosis Date  . Anemia   . Aortic valve disorder    2D ECHO, 10/19/2011 - EF >55%, LA mild-moderately dilated, mild-moderate tricuspid regurgitation, mild-moderate valvular aortic stenosis, moderate calcification of aortic valve leaflets  . Arthritis    "just a touch in my hands" (11/24/2016)  . Atherosclerosis of renal artery (HCC)    RENAL DOPPLER, 12/10/2011 - Left renal artery demonstrated narrowing with elevated velocities consistent with a 1-59% diameter reduction  . Atrial fibrillation (HCC)   . CKD (chronic kidney disease) stage 3, GFR 30-59 ml/min (HCC)    "stable now since they backed off the water pills" (11/24/2016)  . Coronary artery disease    a. s/p CABG in 1994 b. s/p PCI with DES to SVG-D1 in 2003 c. low-risk NST in 01/2015  . GERD (gastroesophageal reflux disease)   . Heart murmur     . High cholesterol   . History of lower GI bleeding    "diverticulitis"  . Hypertension   . PAF (paroxysmal atrial fibrillation) (HCC)    a. on Eliquis    Blood pressure (!) 176/80, pulse (!) 48.  (right arm 156/80)  (standing 170/82)  Essential hypertension Patient with poorly controlled hypertension, with recent spikes up to 200 systolic.  Since increasing the amlodipine to 10 mg daily (he takes 5 mg bid), he has noted a trend downward in his BP.  He still has readings mostly in the 150-160's systolic.  Will have him add hydralazine 25 mg bid back into his current regimen.  He is to continue with daily home BP checks and return in 3-4 weeks with home cuff and list of readings.  He was given instructions on proper technique for home BP monitoring.     Phillips Hay PharmD CPP [facility] [address] [place], [place] [address] [phone]

## 2018-02-22 NOTE — Patient Instructions (Signed)
Return for a a follow up appointment in 3-4 weeks  Your blood pressure today is 176/80  HR 48  Check your blood pressure at home daily and keep record of the readings.  Take your BP meds as follows:  Restart the hydralazine 25 mg twice daily - morning and evening  Continue with all other medications.  Bring all of your meds, your BP cuff and your record of home blood pressures to your next appointment.  Exercise as you're able, try to walk approximately 30 minutes per day.  Keep salt intake to a minimum, especially watch canned and prepared boxed foods.  Eat more fresh fruits and vegetables and fewer canned items.  Avoid eating in fast food restaurants.    HOW TO TAKE YOUR BLOOD PRESSURE: . Rest 5 minutes before taking your blood pressure. .  Don't smoke or drink caffeinated beverages for at least 30 minutes before. . Take your blood pressure before (not after) you eat. . Sit comfortably with your back supported and both feet on the floor (don't cross your legs). . Elevate your arm to heart level on a table or a desk. . Use the proper sized cuff. It should fit smoothly and snugly around your bare upper arm. There should be enough room to slip a fingertip under the cuff. The bottom edge of the cuff should be 1 inch above the crease of the elbow. . Ideally, take 3 measurements at one sitting and record the average.

## 2018-02-22 NOTE — Assessment & Plan Note (Signed)
Patient with poorly controlled hypertension, with recent spikes up to 200 systolic.  Since increasing the amlodipine to 10 mg daily (he takes 5 mg bid), he has noted a trend downward in his BP.  He still has readings mostly in the 150-160's systolic.  Will have him add hydralazine 25 mg bid back into his current regimen.  He is to continue with daily home BP checks and return in 3-4 weeks with home cuff and list of readings.  He was given instructions on proper technique for home BP monitoring.

## 2018-03-02 ENCOUNTER — Inpatient Hospital Stay
Admission: EM | Admit: 2018-03-02 | Discharge: 2018-03-04 | DRG: 310 | Disposition: A | Discharge status: Home / Self Care | Payer: Medicare Other | Attending: Internal Medicine | Admitting: Internal Medicine

## 2018-03-02 ENCOUNTER — Other Ambulatory Visit: Payer: Self-pay

## 2018-03-02 ENCOUNTER — Emergency Department: Payer: Medicare Other

## 2018-03-02 DIAGNOSIS — I4581 Long QT syndrome: Secondary | ICD-10-CM | POA: Diagnosis present

## 2018-03-02 DIAGNOSIS — W010XXA Fall on same level from slipping, tripping and stumbling without subsequent striking against object, initial encounter: Secondary | ICD-10-CM | POA: Diagnosis not present

## 2018-03-02 DIAGNOSIS — D631 Anemia in chronic kidney disease: Secondary | ICD-10-CM | POA: Diagnosis not present

## 2018-03-02 DIAGNOSIS — I452 Bifascicular block: Principal | ICD-10-CM | POA: Diagnosis present

## 2018-03-02 DIAGNOSIS — R001 Bradycardia, unspecified: Secondary | ICD-10-CM | POA: Diagnosis present

## 2018-03-02 DIAGNOSIS — I129 Hypertensive chronic kidney disease with stage 1 through stage 4 chronic kidney disease, or unspecified chronic kidney disease: Secondary | ICD-10-CM | POA: Diagnosis not present

## 2018-03-02 DIAGNOSIS — Z79899 Other long term (current) drug therapy: Secondary | ICD-10-CM | POA: Diagnosis not present

## 2018-03-02 DIAGNOSIS — Z951 Presence of aortocoronary bypass graft: Secondary | ICD-10-CM | POA: Diagnosis not present

## 2018-03-02 DIAGNOSIS — R296 Repeated falls: Secondary | ICD-10-CM | POA: Diagnosis present

## 2018-03-02 DIAGNOSIS — K219 Gastro-esophageal reflux disease without esophagitis: Secondary | ICD-10-CM | POA: Diagnosis not present

## 2018-03-02 DIAGNOSIS — Z955 Presence of coronary angioplasty implant and graft: Secondary | ICD-10-CM | POA: Diagnosis not present

## 2018-03-02 DIAGNOSIS — I25118 Atherosclerotic heart disease of native coronary artery with other forms of angina pectoris: Secondary | ICD-10-CM | POA: Diagnosis not present

## 2018-03-02 DIAGNOSIS — T50905A Adverse effect of unspecified drugs, medicaments and biological substances, initial encounter: Secondary | ICD-10-CM | POA: Diagnosis not present

## 2018-03-02 DIAGNOSIS — I082 Rheumatic disorders of both aortic and tricuspid valves: Secondary | ICD-10-CM | POA: Diagnosis not present

## 2018-03-02 DIAGNOSIS — G4733 Obstructive sleep apnea (adult) (pediatric): Secondary | ICD-10-CM | POA: Diagnosis present

## 2018-03-02 DIAGNOSIS — I480 Paroxysmal atrial fibrillation: Secondary | ICD-10-CM | POA: Diagnosis not present

## 2018-03-02 DIAGNOSIS — N183 Chronic kidney disease, stage 3 (moderate): Secondary | ICD-10-CM | POA: Diagnosis not present

## 2018-03-02 DIAGNOSIS — Z7901 Long term (current) use of anticoagulants: Secondary | ICD-10-CM | POA: Diagnosis not present

## 2018-03-02 DIAGNOSIS — R55 Syncope and collapse: Secondary | ICD-10-CM

## 2018-03-02 DIAGNOSIS — E785 Hyperlipidemia, unspecified: Secondary | ICD-10-CM | POA: Diagnosis present

## 2018-03-02 DIAGNOSIS — I701 Atherosclerosis of renal artery: Secondary | ICD-10-CM | POA: Diagnosis not present

## 2018-03-02 HISTORY — DX: Nonrheumatic aortic (valve) stenosis: I35.0

## 2018-03-02 HISTORY — DX: Iron deficiency anemia, unspecified: D50.9

## 2018-03-02 LAB — BASIC METABOLIC PANEL
Anion gap: 9 (ref 5–15)
BUN: 29 mg/dL — AB (ref 8–23)
CO2: 21 mmol/L — ABNORMAL LOW (ref 22–32)
CREATININE: 1.91 mg/dL — AB (ref 0.61–1.24)
Calcium: 8.7 mg/dL — ABNORMAL LOW (ref 8.9–10.3)
Chloride: 109 mmol/L (ref 98–111)
GFR calc Af Amer: 37 mL/min — ABNORMAL LOW (ref >60)
GFR, EST NON AFRICAN AMERICAN: 32 mL/min — AB (ref >60)
Glucose, Bld: 137 mg/dL — ABNORMAL HIGH (ref 70–99)
Potassium: 4.1 mmol/L (ref 3.5–5.1)
SODIUM: 139 mmol/L (ref 135–145)

## 2018-03-02 LAB — CBC WITH DIFFERENTIAL/PLATELET
Abs Immature Granulocytes: 0.09 10*3/uL — ABNORMAL HIGH (ref 0.00–0.07)
Basophils Absolute: 0 10*3/uL (ref 0.0–0.1)
Basophils Relative: 1 %
EOS ABS: 0.2 10*3/uL (ref 0.0–0.5)
Eosinophils Relative: 3 %
HEMATOCRIT: 32.1 % — AB (ref 39.0–52.0)
HEMOGLOBIN: 10.3 g/dL — AB (ref 13.0–17.0)
IMMATURE GRANULOCYTES: 1 %
LYMPHS ABS: 0.8 10*3/uL (ref 0.7–4.0)
LYMPHS PCT: 13 %
MCH: 31 pg (ref 26.0–34.0)
MCHC: 32.1 g/dL (ref 30.0–36.0)
MCV: 96.7 fL (ref 80.0–100.0)
MONOS PCT: 17 %
Monocytes Absolute: 1.1 10*3/uL — ABNORMAL HIGH (ref 0.1–1.0)
NEUTROS PCT: 65 %
Neutro Abs: 4.2 10*3/uL (ref 1.7–7.7)
Platelets: 195 10*3/uL (ref 150–400)
RBC: 3.32 MIL/uL — ABNORMAL LOW (ref 4.22–5.81)
RDW: 15.8 % — ABNORMAL HIGH (ref 11.5–15.5)
WBC: 6.4 10*3/uL (ref 4.0–10.5)
nRBC: 0 % (ref 0.0–0.2)

## 2018-03-02 LAB — URINALYSIS, COMPLETE (UACMP) WITH MICROSCOPIC
BACTERIA UA: NONE SEEN
BILIRUBIN URINE: NEGATIVE
GLUCOSE, UA: NEGATIVE mg/dL
HGB URINE DIPSTICK: NEGATIVE
KETONES UR: NEGATIVE mg/dL
LEUKOCYTES UA: NEGATIVE
Nitrite: NEGATIVE
PROTEIN: NEGATIVE mg/dL
SQUAMOUS EPITHELIAL / LPF: NONE SEEN (ref 0–5)
Specific Gravity, Urine: 1.02 (ref 1.005–1.030)
pH: 5.5 (ref 5.0–8.0)

## 2018-03-02 LAB — TROPONIN I

## 2018-03-02 LAB — PROTIME-INR
INR: 1.59
PROTHROMBIN TIME: 18.8 s — AB (ref 11.4–15.2)

## 2018-03-02 LAB — BRAIN NATRIURETIC PEPTIDE: B Natriuretic Peptide: 380 pg/mL — ABNORMAL HIGH (ref 0.0–100.0)

## 2018-03-02 IMAGING — DX DG CHEST 1V PORT
1 series · 1 of 1 positions shown · non-contrast
Comparison: [DATE]

CLINICAL DATA: Chest pain and dizziness.

EXAM:
PORTABLE CHEST 1 VIEW

[chest ap]
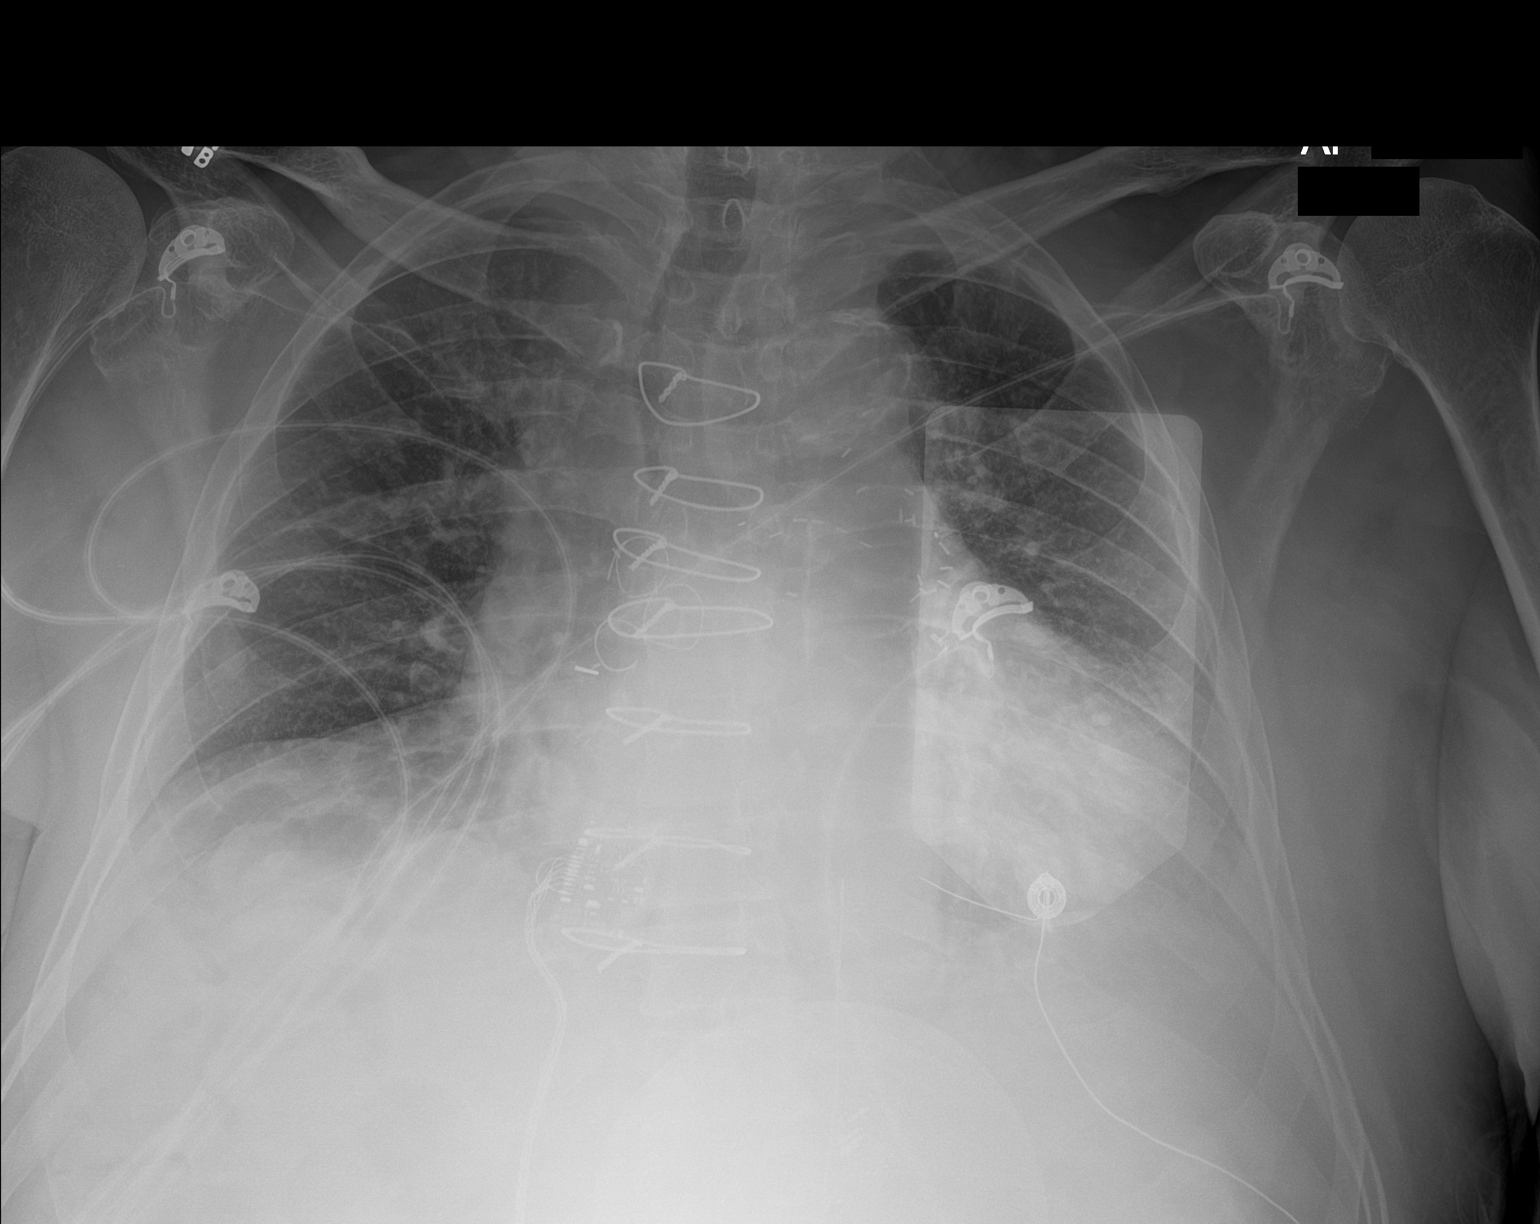

[1 of 1 positions shown; findings below may reference images not displayed]

FINDINGS: Sternotomy wires unchanged. Lungs are adequately inflated with mild
prominence of the perihilar markings likely mild vascular
congestion. Possible hazy opacification in the left base. Stable
cardiomegaly. Remainder of the exam is unchanged.
IMPRESSION: Possible small left effusion/atelectasis versus infection.

Cardiomegaly with mild vascular congestion.

## 2018-03-02 NOTE — ED Provider Notes (Signed)
[facility] Emergency Department Provider Note ____________________________________________   First MD Initiated Contact with Patient 03/02/18 2051     (approximate)  I have reviewed the triage vital signs and the nursing notes.   HISTORY  Chief Complaint No chief complaint on file.    HPI Jorge Cherry is a 82 y.o. male with PMH as noted below including extensive CAD history who presents with near syncope, acute onset this evening, now resolved, and associated with bradycardia.  Patient states that he went outside to do something in his yard and tripped over a tree stump.  He then began to feel lightheaded, weak, and diaphoretic.  He states that he felt he was about to pass out.  When EMS arrived, his heart rate was in the 20s.  Atropine was given by EMS.  The patient states that he had some shortness of breath as well but no significant chest pain.  He is feeling better now.  Past Medical History:  Diagnosis Date  . Anemia   . Aortic valve disorder    2D ECHO, 10/19/2011 - EF >55%, LA mild-moderately dilated, mild-moderate tricuspid regurgitation, mild-moderate valvular aortic stenosis, moderate calcification of aortic valve leaflets  . Arthritis    "just a touch in my hands" (11/24/2016)  . Atherosclerosis of renal artery (HCC)    RENAL DOPPLER, 12/10/2011 - Left renal artery demonstrated narrowing with elevated velocities consistent with a 1-59% diameter reduction  . Atrial fibrillation (HCC)   . CKD (chronic kidney disease) stage 3, GFR 30-59 ml/min (HCC)    "stable now since they backed off the water pills" (11/24/2016)  . Coronary artery disease    a. s/p CABG in 1994 b. s/p PCI with DES to SVG-D1 in 2003 c. low-risk NST in 01/2015  . GERD (gastroesophageal reflux disease)   . Heart murmur   . High cholesterol   . History of lower GI bleeding    "diverticulitis"  . Hypertension   . PAF (paroxysmal atrial fibrillation) (HCC)    a. on Eliquis     Patient Active Problem List   Diagnosis Date Noted  . Chronic anticoagulation 12/14/2017  . Mild intermittent asthma without complication 07/22/2017  . LPRD (laryngopharyngeal reflux disease) 07/22/2017  . Anemia 11/25/2016  . Coronary artery disease with exertional angina (HCC) 11/24/2016  . Exertional angina (HCC)   . Chronic diastolic heart failure (HCC) 05/26/2016  . Junctional (nodal) bradycardia 11/20/2015  . Paroxysmal atrial fibrillation (HCC) 11/20/2015  . Hyperlipidemia LDL goal <70 05/03/2015  . Mild persistent asthma 03/19/2015  . Allergic rhinitis 03/19/2015  . Gastroesophageal reflux disease 03/19/2015  . Essential hypertension 02/07/2015  . Chest pain 01/22/2015  . Pain in the chest   . Chronic renal insufficiency, stage III (moderate) (HCC) 12/28/2014  . Lower GI bleed 09/24/2014  . Diverticulosis of colon with hemorrhage 09/24/2014  . Symptomatic anemia 09/24/2014  . Aortic valve stenosis 07/22/2013  . Hyperlipidemia with target LDL less than 70 10/27/2012  . Edema 10/27/2012  . Hearing decreased 10/27/2012  . Hx of CABG 10/27/2012  . Coronary atherosclerosis of native coronary artery 10/27/2012  . Benign hypertensive heart disease without heart failure 10/27/2012    Past Surgical History:  Procedure Laterality Date  . CARDIAC CATHETERIZATION  02/27/2004   Coronary intervention and medical management  . CARDIAC CATHETERIZATION  11/24/2016  . CATARACT EXTRACTION W/ INTRAOCULAR LENS IMPLANT Left   . CORONARY ANGIOPLASTY  1994 X 2   "before bypass surgery"  . CORONARY  ANGIOPLASTY WITH STENT PLACEMENT  03/04/2004   SVG supplying the diagonal vessel stented with a 3.5x28mm Taxus stent post dilated to 4.0 mm  . CORONARY ARTERY BYPASS GRAFT  1994   "CABG X4"  . INGUINAL HERNIA REPAIR Right   . RIGHT HEART CATH AND CORONARY/GRAFT ANGIOGRAPHY N/A 11/24/2016   Procedure: Right Heart Cath and Coronary/Graft Angiography;  Surgeon: Lennette Bihari, MD;  Location:  [facility] INVASIVE CV LAB;  Service: Cardiovascular;  Laterality: N/A;  . TONSILLECTOMY AND ADENOIDECTOMY      Prior to Admission medications   Medication Sig Start Date End Date Taking? Authorizing Provider  acetaminophen (TYLENOL) 500 MG tablet Take 1,000 mg by mouth every 8 (eight) hours as needed. For 3-4 days for pain   Yes [provider]  albuterol (PROAIR HFA) 108 (90 Base) MCG/ACT inhaler INHALE TWO PUFFS EVERY 4-6 HOURS AS NEEDED FOR COUGH OR WHEEZE 01/18/18  Yes Kozlow, Alvira Philips, MD  amiodarone (PACERONE) 200 MG tablet Take 1 tablet (200 mg total) by mouth 2 (two) times daily. 08/26/17  Yes Lennette Bihari, MD  amLODipine (NORVASC) 5 MG tablet Take 5 mg twice a day 02/11/18  Yes Lennette Bihari, MD  aspirin EC 81 MG tablet Take 1 tablet (81 mg total) by mouth every evening. 11/25/16  Yes Laverda Page B, NP  atorvastatin (LIPITOR) 20 MG tablet Take 20 mg by mouth every evening.  12/23/15  Yes [provider]  Cholecalciferol (VITAMIN D-3 PO) Take 1,000 Units by mouth every evening.    Yes [provider]  ELIQUIS 5 MG TABS tablet TAKE 1 TABLET BY MOUTH TWICE A DAY 02/11/18  Yes Lennette Bihari, MD  ezetimibe (ZETIA) 10 MG tablet TAKE 1 TABLET (10 MG TOTAL) BY MOUTH DAILY. 09/27/17  Yes Lennette Bihari, MD  fenofibrate (TRICOR) 145 MG tablet Take 72.5 mg by mouth daily.  12/10/15  Yes [provider]  hydrALAZINE (APRESOLINE) 25 MG tablet Take 25 mg by mouth 2 (two) times daily.   Yes [provider]  ipratropium (ATROVENT) 0.06 % nasal spray Can use two sprays in each nostril every six hours as needed to dry up nose. 01/18/18  Yes Kozlow, Alvira Philips, MD  isosorbide mononitrate (IMDUR) 60 MG 24 hr tablet Take 2 tablets (120 mg total) by mouth every morning AND 0.5 tablets (30 mg total) every evening. 01/26/18  Yes Lennette Bihari, MD  loratadine (CLARITIN) 10 MG tablet Take 10 mg by mouth every evening.    Yes [provider]  Metoprolol Succinate 25 MG  CS24 Take 25 mg by mouth 2 (two) times daily. 02/22/18  Yes Lennette Bihari, MD  nitroGLYCERIN (NITROLINGUAL) 0.4 MG/SPRAY spray PLACE 1 SPRAY UNDER THE TONGUE EVERY 5 (FIVE) MINUTES X 3 DOSES AS NEEDED FOR CHEST PAIN. 01/11/18  Yes Kilroy, Eda Paschal, PA-C  Omega-3 Fatty Acids (FISH OIL) 1000 MG CAPS Take 1 capsule by mouth 2 (two) times daily.    Yes [provider]  pantoprazole (PROTONIX) 40 MG tablet Take 1 tablet (40 mg total) by mouth 2 (two) times daily. Patient taking differently: Take 40 mg by mouth daily.  01/18/18  Yes Kozlow, Alvira Philips, MD  ranolazine (RANEXA) 500 MG 12 hr tablet Take 250 mg by mouth 2 (two) times daily.    Yes [provider]  telmisartan (MICARDIS) 80 MG tablet Take 1/2 tablet daily 02/11/18  Yes Lennette Bihari, MD  torsemide (DEMADEX) 20 MG tablet Take 1/2 tablet daily  02/11/18  Yes Lennette Bihari, MD  vitamin C (ASCORBIC ACID) 500 MG tablet Take 500 mg by mouth daily.   Yes [provider]  vitamin E 400 UNIT capsule Take 400 Units by mouth daily.   Yes [provider]  zinc gluconate 50 MG tablet Take 50 mg by mouth 2 (two) times a week.   Yes [provider]    Allergies Altace [ramipril]; Mucinex [guaifenesin er]; and Contrast media [iodinated diagnostic agents]  Family History  Problem Relation Age of Onset  . Cancer Mother   . Heart disease Father        died age 24  . Stroke Maternal Grandmother   . Cancer Maternal Grandfather     Social History Social History   Tobacco Use  . Smoking status: Never Smoker  . Smokeless tobacco: Never Used  Substance Use Topics  . Alcohol use: No    Alcohol/week: 0.0 standard drinks  . Drug use: No    Review of Systems  Constitutional: No fever. Eyes: No redness. ENT: No sore throat. Cardiovascular: Denies chest pain. Respiratory: Positive for mild shortness of breath. Gastrointestinal: No vomiting or diarrhea.  Genitourinary: Negative for dysuria.  Musculoskeletal:  Negative for back pain. Skin: Negative for rash. Neurological: Negative for headache.   ____________________________________________   PHYSICAL EXAM:  VITAL SIGNS: ED Triage Vitals  Enc Vitals Group     BP 03/02/18 2052 (!) 151/80     Pulse Rate 03/02/18 2052 (!) 56     Resp 03/02/18 2130 18     Temp 03/02/18 2052 (!) 97.5 F (36.4 C)     Temp src --      SpO2 03/02/18 2052 97 %     Weight 03/02/18 2055 202 lb (91.6 kg)     Height 03/02/18 2055 5\' 8"  (1.727 m)     Head Circumference --      Peak Flow --      Pain Score 03/02/18 2054 2     Pain Loc --      Pain Edu? --      Excl. in GC? --     Constitutional: Alert and oriented.  Slightly weak appearing but  in no acute distress. Eyes: Conjunctivae are normal.  Head: Atraumatic. Nose: No congestion/rhinnorhea. Mouth/Throat: Mucous membranes are moist.   Neck: Normal range of motion.  Cardiovascular: Normal rate, regular rhythm. Grossly normal heart sounds.  Good peripheral circulation. Respiratory: Normal respiratory effort.  No retractions. Lungs CTAB. Gastrointestinal: Soft and nontender. No distention.  Genitourinary: No flank tenderness. Musculoskeletal: No lower extremity edema.  Extremities warm and well perfused.  Neurologic:  Normal speech and language. No gross focal neurologic deficits are appreciated.  Skin:  Skin is warm and dry. No rash noted. Psychiatric: Mood and affect are normal. Speech and behavior are normal.  ____________________________________________   LABS (all labs ordered are listed, but only abnormal results are displayed)  Labs Reviewed  BASIC METABOLIC PANEL - Abnormal; Notable for the following components:      Result Value   CO2 21 (*)    Glucose, Bld 137 (*)    BUN 29 (*)    Creatinine, Ser 1.91 (*)    Calcium 8.7 (*)    GFR calc non Af Amer 32 (*)    GFR calc Af Amer 37 (*)    All other components within normal limits  BRAIN NATRIURETIC PEPTIDE - Abnormal; Notable for the  following components:   B Natriuretic Peptide 380.0 (*)  All other components within normal limits  CBC WITH DIFFERENTIAL/PLATELET - Abnormal; Notable for the following components:   RBC 3.32 (*)    Hemoglobin 10.3 (*)    HCT 32.1 (*)    RDW 15.8 (*)    Monocytes Absolute 1.1 (*)    Abs Immature Granulocytes 0.09 (*)    All other components within normal limits  PROTIME-INR - Abnormal; Notable for the following components:   Prothrombin Time 18.8 (*)    All other components within normal limits  TROPONIN I  URINALYSIS, COMPLETE (UACMP) WITH MICROSCOPIC   ____________________________________________  EKG  ED ECG REPORT I, Dionne Bucy, the attending physician, personally viewed and interpreted this ECG.  Date: 03/02/2018 EKG Time: 2049 Rate: 58 Rhythm: Possible atrial fibrillation QRS Axis: normal Intervals: RBBB, LAFB ST/T Wave abnormalities: LVH Narrative Interpretation: Atrial fibrillation versus sinus rhythm with no acute ischemic findings  ____________________________________________  RADIOLOGY  CXR: Cardiomegaly with vascular congestion  ____________________________________________   PROCEDURES  Procedure(s) performed: No  Procedures  Critical Care performed: Yes  CRITICAL CARE Performed by: Dionne Bucy   Total critical care time: 30 minutes  Critical care time was exclusive of separately billable procedures and treating other patients.  Critical care was necessary to treat or prevent imminent or life-threatening deterioration.  Critical care was time spent personally by me on the following activities: development of treatment plan with patient and/or surrogate as well as nursing, discussions with consultants, evaluation of patient's response to treatment, examination of patient, obtaining history from patient or surrogate, ordering and performing treatments and interventions, ordering and review of laboratory studies, ordering and  review of radiographic studies, pulse oximetry and re-evaluation of patient's condition. ____________________________________________   INITIAL IMPRESSION / ASSESSMENT AND PLAN / ED COURSE  Pertinent labs & imaging results that were available during my care of the patient were reviewed by me and considered in my medical decision making (see chart for details).  82 year old male with PMH as noted above including extensive CAD history, atrial fibrillation, and apparent history of bradycardia in the past presents with an episode of near syncope with associated significant bradycardia which now appears to have resolved after atropine was given by EMS.  On exam in the ED the patient's heart rate was in the 50s and his other vital signs have been normal.  The remainder of the exam is as described above.  EKG is nonischemic at this time.  Initially on arrival the patient had a heart rate in the mid 50s but given that he had just been given atropine we are concerned for recurrent bradycardia or other dysrhythmia.  We placed the patient on Zoll monitor and pads and monitored him closely.  However his bradycardia has not recurred.  We will obtain labs and continue to observe.  I feel that the patient will most likely need admission for cardiac monitoring given the near syncope with significant abnormal vital signs and his overall high cardiac risk.  ----------------------------------------- 11:36 PM on 03/02/2018 -----------------------------------------  Lab work-up is consistent with the patient's baseline except for slightly worsening creatinine.  He has had no recurrent bradycardia or other arrhythmia.  He is stable for admission at this time.  ----------------------------------------- 12:24 AM on 03/03/2018 -----------------------------------------  I signed the patient out to the hospitalist Dr. Caryn Bee. ____________________________________________   FINAL CLINICAL IMPRESSION(S) / ED  DIAGNOSES  Final diagnoses:  Near syncope  Bradycardia      NEW MEDICATIONS STARTED DURING THIS VISIT:  New Prescriptions   No medications  on file     Note:  This document was prepared using Dragon voice recognition software and may include unintentional dictation errors.    Dionne Bucy, MD 03/03/18 [phone]

## 2018-03-02 NOTE — ED Triage Notes (Signed)
Pt. Arrived via [facility] due to chest pain and dizziness.  Pt. States he went outside and tripped over a tree stump.  Pt. States he went inside and started feeling light-headed, dizzy, and sweaty. [facility] states patients heart rate was 26 upon arrival.  Patient has a history of CABG in 1994.  Patient states he has a 95% blockage currently.  Patient was given Atropine .5 by [facility] which brought his heart rate to 55.

## 2018-03-03 ENCOUNTER — Inpatient Hospital Stay (HOSPITAL_COMMUNITY)
Admit: 2018-03-03 | Discharge: 2018-03-03 | Disposition: A | Discharge status: Home / Self Care | Payer: Medicare Other | Attending: Nurse Practitioner | Admitting: Nurse Practitioner

## 2018-03-03 ENCOUNTER — Encounter: Payer: Self-pay | Admitting: Nurse Practitioner

## 2018-03-03 ENCOUNTER — Other Ambulatory Visit: Payer: Self-pay | Admitting: Nurse Practitioner

## 2018-03-03 DIAGNOSIS — I701 Atherosclerosis of renal artery: Secondary | ICD-10-CM | POA: Diagnosis not present

## 2018-03-03 DIAGNOSIS — R001 Bradycardia, unspecified: Secondary | ICD-10-CM

## 2018-03-03 DIAGNOSIS — Z951 Presence of aortocoronary bypass graft: Secondary | ICD-10-CM | POA: Diagnosis not present

## 2018-03-03 DIAGNOSIS — N183 Chronic kidney disease, stage 3 (moderate): Secondary | ICD-10-CM | POA: Diagnosis not present

## 2018-03-03 DIAGNOSIS — G4733 Obstructive sleep apnea (adult) (pediatric): Secondary | ICD-10-CM | POA: Diagnosis not present

## 2018-03-03 DIAGNOSIS — I082 Rheumatic disorders of both aortic and tricuspid valves: Secondary | ICD-10-CM | POA: Diagnosis not present

## 2018-03-03 DIAGNOSIS — T50905A Adverse effect of unspecified drugs, medicaments and biological substances, initial encounter: Secondary | ICD-10-CM | POA: Diagnosis not present

## 2018-03-03 DIAGNOSIS — K219 Gastro-esophageal reflux disease without esophagitis: Secondary | ICD-10-CM | POA: Diagnosis not present

## 2018-03-03 DIAGNOSIS — I25118 Atherosclerotic heart disease of native coronary artery with other forms of angina pectoris: Secondary | ICD-10-CM

## 2018-03-03 DIAGNOSIS — I480 Paroxysmal atrial fibrillation: Secondary | ICD-10-CM | POA: Diagnosis not present

## 2018-03-03 DIAGNOSIS — W010XXA Fall on same level from slipping, tripping and stumbling without subsequent striking against object, initial encounter: Secondary | ICD-10-CM | POA: Diagnosis not present

## 2018-03-03 DIAGNOSIS — I452 Bifascicular block: Secondary | ICD-10-CM | POA: Diagnosis not present

## 2018-03-03 DIAGNOSIS — W19XXXA Unspecified fall, initial encounter: Secondary | ICD-10-CM

## 2018-03-03 DIAGNOSIS — D631 Anemia in chronic kidney disease: Secondary | ICD-10-CM | POA: Diagnosis not present

## 2018-03-03 DIAGNOSIS — Z7901 Long term (current) use of anticoagulants: Secondary | ICD-10-CM | POA: Diagnosis not present

## 2018-03-03 DIAGNOSIS — E785 Hyperlipidemia, unspecified: Secondary | ICD-10-CM | POA: Diagnosis not present

## 2018-03-03 DIAGNOSIS — R296 Repeated falls: Secondary | ICD-10-CM | POA: Diagnosis not present

## 2018-03-03 DIAGNOSIS — I129 Hypertensive chronic kidney disease with stage 1 through stage 4 chronic kidney disease, or unspecified chronic kidney disease: Secondary | ICD-10-CM | POA: Diagnosis not present

## 2018-03-03 DIAGNOSIS — I4581 Long QT syndrome: Secondary | ICD-10-CM | POA: Diagnosis not present

## 2018-03-03 DIAGNOSIS — Z79899 Other long term (current) drug therapy: Secondary | ICD-10-CM | POA: Diagnosis not present

## 2018-03-03 DIAGNOSIS — I340 Nonrheumatic mitral (valve) insufficiency: Secondary | ICD-10-CM | POA: Diagnosis not present

## 2018-03-03 DIAGNOSIS — I498 Other specified cardiac arrhythmias: Secondary | ICD-10-CM | POA: Diagnosis not present

## 2018-03-03 DIAGNOSIS — R55 Syncope and collapse: Secondary | ICD-10-CM | POA: Diagnosis not present

## 2018-03-03 LAB — BASIC METABOLIC PANEL
Anion gap: 7 (ref 5–15)
BUN: 24 mg/dL — AB (ref 8–23)
CO2: 25 mmol/L (ref 22–32)
Calcium: 8.7 mg/dL — ABNORMAL LOW (ref 8.9–10.3)
Chloride: 109 mmol/L (ref 98–111)
Creatinine, Ser: 1.57 mg/dL — ABNORMAL HIGH (ref 0.61–1.24)
GFR calc Af Amer: 47 mL/min — ABNORMAL LOW (ref >60)
GFR, EST NON AFRICAN AMERICAN: 40 mL/min — AB (ref >60)
GLUCOSE: 108 mg/dL — AB (ref 70–99)
POTASSIUM: 3.9 mmol/L (ref 3.5–5.1)
Sodium: 141 mmol/L (ref 135–145)

## 2018-03-03 LAB — CBC
HCT: 33.8 % — ABNORMAL LOW (ref 39.0–52.0)
HEMOGLOBIN: 10.7 g/dL — AB (ref 13.0–17.0)
MCH: 30.8 pg (ref 26.0–34.0)
MCHC: 31.7 g/dL (ref 30.0–36.0)
MCV: 97.4 fL (ref 80.0–100.0)
PLATELETS: 216 10*3/uL (ref 150–400)
RBC: 3.47 MIL/uL — ABNORMAL LOW (ref 4.22–5.81)
RDW: 15.9 % — ABNORMAL HIGH (ref 11.5–15.5)
WBC: 6.8 10*3/uL (ref 4.0–10.5)
nRBC: 0 % (ref 0.0–0.2)

## 2018-03-03 LAB — ECHOCARDIOGRAM COMPLETE
HEIGHTINCHES: 68 in
WEIGHTICAEL: 3232 [oz_av]

## 2018-03-03 MED ORDER — NITROGLYCERIN 0.4 MG SL SUBL: 0.4000 mg | SUBLINGUAL_TABLET | SUBLINGUAL | Status: DC | PRN

## 2018-03-03 MED ORDER — ZINC GLUCONATE 50 MG PO TABS: 50.0000 mg | ORAL_TABLET | ORAL | Status: DC

## 2018-03-03 MED ORDER — ZINC SULFATE 220 (50 ZN) MG PO CAPS
220.0000 mg | ORAL_CAPSULE | ORAL | Status: DC
  Administered 2018-03-03: 220 mg via ORAL
  Filled 2018-03-03: qty 1

## 2018-03-03 MED ORDER — ACETAMINOPHEN 650 MG RE SUPP: 650.0000 mg | Freq: Four times a day (QID) | RECTAL | Status: DC | PRN

## 2018-03-03 MED ORDER — EZETIMIBE 10 MG PO TABS
10.0000 mg | ORAL_TABLET | Freq: Every day | ORAL | Status: DC
  Administered 2018-03-03 – 2018-03-04 (×2): 10 mg via ORAL
  Filled 2018-03-03 (×2): qty 1

## 2018-03-03 MED ORDER — VITAMIN E 180 MG (400 UNIT) PO CAPS
400.0000 [IU] | ORAL_CAPSULE | Freq: Every day | ORAL | Status: DC
  Administered 2018-03-03 – 2018-03-04 (×2): 400 [IU] via ORAL
  Filled 2018-03-03 (×2): qty 1

## 2018-03-03 MED ORDER — DOCUSATE SODIUM 100 MG PO CAPS
100.0000 mg | ORAL_CAPSULE | Freq: Two times a day (BID) | ORAL | Status: DC
  Administered 2018-03-03 – 2018-03-04 (×4): 100 mg via ORAL
  Filled 2018-03-03 (×4): qty 1

## 2018-03-03 MED ORDER — SODIUM CHLORIDE 0.9 % IV SOLN
Freq: Once | INTRAVENOUS | Status: AC
  Administered 2018-03-03: 03:00:00 via INTRAVENOUS

## 2018-03-03 MED ORDER — RANOLAZINE ER 500 MG PO TB12: 250.0000 mg | ORAL_TABLET | Freq: Two times a day (BID) | ORAL | Status: DC

## 2018-03-03 MED ORDER — OMEGA-3-ACID ETHYL ESTERS 1 G PO CAPS
1.0000 g | ORAL_CAPSULE | Freq: Every day | ORAL | Status: DC
  Administered 2018-03-03 – 2018-03-04 (×2): 1 g via ORAL
  Filled 2018-03-03 (×2): qty 1

## 2018-03-03 MED ORDER — RANOLAZINE ER 500 MG PO TB12
500.0000 mg | ORAL_TABLET | Freq: Two times a day (BID) | ORAL | Status: DC
  Administered 2018-03-03 – 2018-03-04 (×3): 500 mg via ORAL
  Filled 2018-03-03 (×4): qty 1

## 2018-03-03 MED ORDER — FENOFIBRATE 54 MG PO TABS
54.0000 mg | ORAL_TABLET | Freq: Every day | ORAL | Status: DC
  Administered 2018-03-03 – 2018-03-04 (×2): 54 mg via ORAL
  Filled 2018-03-03 (×2): qty 1

## 2018-03-03 MED ORDER — HYDRALAZINE HCL 25 MG PO TABS
25.0000 mg | ORAL_TABLET | Freq: Two times a day (BID) | ORAL | Status: DC
  Administered 2018-03-03 – 2018-03-04 (×4): 25 mg via ORAL
  Filled 2018-03-03 (×4): qty 1

## 2018-03-03 MED ORDER — AMLODIPINE BESYLATE 5 MG PO TABS
5.0000 mg | ORAL_TABLET | Freq: Every day | ORAL | Status: DC
  Administered 2018-03-03 – 2018-03-04 (×2): 5 mg via ORAL
  Filled 2018-03-03 (×2): qty 1

## 2018-03-03 MED ORDER — VITAMIN C 500 MG PO TABS
500.0000 mg | ORAL_TABLET | Freq: Every day | ORAL | Status: DC
  Administered 2018-03-03 – 2018-03-04 (×2): 500 mg via ORAL
  Filled 2018-03-03 (×2): qty 1

## 2018-03-03 MED ORDER — IRBESARTAN 150 MG PO TABS
300.0000 mg | ORAL_TABLET | Freq: Every day | ORAL | Status: DC
  Administered 2018-03-03 – 2018-03-04 (×2): 300 mg via ORAL
  Filled 2018-03-03 (×2): qty 2

## 2018-03-03 MED ORDER — TORSEMIDE 20 MG PO TABS
10.0000 mg | ORAL_TABLET | Freq: Every day | ORAL | Status: DC
  Administered 2018-03-03 – 2018-03-04 (×2): 10 mg via ORAL
  Filled 2018-03-03 (×2): qty 1

## 2018-03-03 MED ORDER — APIXABAN 5 MG PO TABS
5.0000 mg | ORAL_TABLET | Freq: Two times a day (BID) | ORAL | Status: DC
  Administered 2018-03-03 – 2018-03-04 (×4): 5 mg via ORAL
  Filled 2018-03-03 (×4): qty 1

## 2018-03-03 MED ORDER — ALBUTEROL SULFATE (2.5 MG/3ML) 0.083% IN NEBU: 2.5000 mg | INHALATION_SOLUTION | Freq: Four times a day (QID) | RESPIRATORY_TRACT | Status: DC | PRN

## 2018-03-03 MED ORDER — AMIODARONE HCL 200 MG PO TABS: 200.0000 mg | ORAL_TABLET | Freq: Every day | ORAL | Status: DC

## 2018-03-03 MED ORDER — ISOSORBIDE MONONITRATE ER 60 MG PO TB24
120.0000 mg | ORAL_TABLET | Freq: Every day | ORAL | Status: DC
  Administered 2018-03-03 – 2018-03-04 (×2): 120 mg via ORAL
  Filled 2018-03-03 (×2): qty 2

## 2018-03-03 MED ORDER — NITROGLYCERIN 0.4 MG/SPRAY TL SOLN: 1.0000 | Status: DC | PRN

## 2018-03-03 MED ORDER — LORATADINE 10 MG PO TABS
10.0000 mg | ORAL_TABLET | Freq: Every evening | ORAL | Status: DC
  Administered 2018-03-03: 10 mg via ORAL
  Filled 2018-03-03: qty 1

## 2018-03-03 MED ORDER — ONDANSETRON HCL 4 MG PO TABS: 4.0000 mg | ORAL_TABLET | Freq: Four times a day (QID) | ORAL | Status: DC | PRN

## 2018-03-03 MED ORDER — PANTOPRAZOLE SODIUM 40 MG PO TBEC
40.0000 mg | DELAYED_RELEASE_TABLET | Freq: Every day | ORAL | Status: DC
  Administered 2018-03-03 – 2018-03-04 (×2): 40 mg via ORAL
  Filled 2018-03-03 (×2): qty 1

## 2018-03-03 MED ORDER — ONDANSETRON HCL 4 MG/2ML IJ SOLN: 4.0000 mg | Freq: Four times a day (QID) | INTRAMUSCULAR | Status: DC | PRN

## 2018-03-03 MED ORDER — HYDROCODONE-ACETAMINOPHEN 5-325 MG PO TABS
1.0000 | ORAL_TABLET | ORAL | Status: DC | PRN
  Administered 2018-03-04 (×2): 2 via ORAL
  Filled 2018-03-03 (×2): qty 2

## 2018-03-03 MED ORDER — ASPIRIN EC 81 MG PO TBEC
81.0000 mg | DELAYED_RELEASE_TABLET | Freq: Every evening | ORAL | Status: DC
  Administered 2018-03-03: 81 mg via ORAL
  Filled 2018-03-03: qty 1

## 2018-03-03 MED ORDER — BISACODYL 5 MG PO TBEC
5.0000 mg | DELAYED_RELEASE_TABLET | Freq: Every day | ORAL | Status: DC | PRN
  Administered 2018-03-03 (×2): 5 mg via ORAL
  Filled 2018-03-03 (×2): qty 1

## 2018-03-03 MED ORDER — TRAZODONE HCL 50 MG PO TABS: 25.0000 mg | ORAL_TABLET | Freq: Every evening | ORAL | Status: DC | PRN

## 2018-03-03 MED ORDER — VITAMIN D3 25 MCG (1000 UNIT) PO TABS
1000.0000 [IU] | ORAL_TABLET | Freq: Every evening | ORAL | Status: DC
  Administered 2018-03-03: 1000 [IU] via ORAL
  Filled 2018-03-03 (×2): qty 1

## 2018-03-03 MED ORDER — ATORVASTATIN CALCIUM 20 MG PO TABS
20.0000 mg | ORAL_TABLET | Freq: Every evening | ORAL | Status: DC
  Administered 2018-03-03: 20 mg via ORAL
  Filled 2018-03-03: qty 1

## 2018-03-03 MED ORDER — ACETAMINOPHEN 325 MG PO TABS: 650.0000 mg | ORAL_TABLET | Freq: Four times a day (QID) | ORAL | Status: DC | PRN

## 2018-03-03 NOTE — H&P (Signed)
[facility] [place] at [facility]   PATIENT NAME: Jorge Cherry    MR#:  [id number]  DATE OF BIRTH:  Jul 18, 1935  DATE OF ADMISSION:  03/02/2018  PRIMARY CARE PHYSICIAN: Chilton Greathouse, MD   REQUESTING/REFERRING PHYSICIAN:   CHIEF COMPLAINT:  No chief complaint on file.   HISTORY OF PRESENT ILLNESS: Jorge Cherry  is a 82 y.o. male with a known history of anemia of chronic diseases, CKD, paroxysmal atrial fibrillation, CAD. Patient was brought to emergency room status post near syncopal episode at home.  He complained of lightheadedness and generalized weakness associated with diaphoresis while working around the house.  He felt like he was going to pass out but he did not lose the consciousness.  When EMS arrived, he was noted to have low heart rate in 20s.  Atropine was administrated by paramedics with improvement in heart rate to 50s.  He currently feels better, denies any dizziness or chest pain.  No reports of fever, chills, nausea, vomiting, diarrhea, bleeding.  He is compliant with his medications, no recent changes in his medication list. EKG shows possible atrial fibrillation with heart rate 58, no acute ischemic changes. Patient is admitted for further evaluation and treatment.  PAST MEDICAL HISTORY:   Past Medical History:  Diagnosis Date  . Anemia   . Aortic valve disorder    2D ECHO, 10/19/2011 - EF >55%, LA mild-moderately dilated, mild-moderate tricuspid regurgitation, mild-moderate valvular aortic stenosis, moderate calcification of aortic valve leaflets  . Arthritis    "just a touch in my hands" (11/24/2016)  . Atherosclerosis of renal artery (HCC)    RENAL DOPPLER, 12/10/2011 - Left renal artery demonstrated narrowing with elevated velocities consistent with a 1-59% diameter reduction  . Atrial fibrillation (HCC)   . CKD (chronic kidney disease) stage 3, GFR 30-59 ml/min (HCC)    "stable now since they backed off the water pills" (11/24/2016)  .  Coronary artery disease    a. s/p CABG in 1994 b. s/p PCI with DES to SVG-D1 in 2003 c. low-risk NST in 01/2015  . GERD (gastroesophageal reflux disease)   . Heart murmur   . High cholesterol   . History of lower GI bleeding    "diverticulitis"  . Hypertension   . PAF (paroxysmal atrial fibrillation) (HCC)    a. on Eliquis    PAST SURGICAL HISTORY:  Past Surgical History:  Procedure Laterality Date  . CARDIAC CATHETERIZATION  02/27/2004   Coronary intervention and medical management  . CARDIAC CATHETERIZATION  11/24/2016  . CATARACT EXTRACTION W/ INTRAOCULAR LENS IMPLANT Left   . CORONARY ANGIOPLASTY  1994 X 2   "before bypass surgery"  . CORONARY ANGIOPLASTY WITH STENT PLACEMENT  03/04/2004   SVG supplying the diagonal vessel stented with a 3.5x19mm Taxus stent post dilated to 4.0 mm  . CORONARY ARTERY BYPASS GRAFT  1994   "CABG X4"  . INGUINAL HERNIA REPAIR Right   . RIGHT HEART CATH AND CORONARY/GRAFT ANGIOGRAPHY N/A 11/24/2016   Procedure: Right Heart Cath and Coronary/Graft Angiography;  Surgeon: Lennette Bihari, MD;  Location: [facility] INVASIVE CV LAB;  Service: Cardiovascular;  Laterality: N/A;  . TONSILLECTOMY AND ADENOIDECTOMY      SOCIAL HISTORY:  Social History   Tobacco Use  . Smoking status: Never Smoker  . Smokeless tobacco: Never Used  Substance Use Topics  . Alcohol use: No    Alcohol/week: 0.0 standard drinks    FAMILY HISTORY:  Family History  Problem Relation Age  of Onset  . Cancer Mother   . Heart disease Father        died age 65  . Stroke Maternal Grandmother   . Cancer Maternal Grandfather     DRUG ALLERGIES:  Allergies  Allergen Reactions  . Altace [Ramipril] Other (See Comments)    Mouth swelling  . Mucinex [Guaifenesin Er] Hives and Other (See Comments)    Mouth swelling  . Contrast Media [Iodinated Diagnostic Agents] Other (See Comments)    Made eyes change each time    REVIEW OF SYSTEMS:   CONSTITUTIONAL: No fever, fatigue or  weakness.  EYES: No changes in vision.  EARS, NOSE, AND THROAT: No tinnitus or ear pain.  RESPIRATORY: No cough, shortness of breath, wheezing or hemoptysis.  CARDIOVASCULAR: Positive for lightheadedness.  No chest pain, orthopnea, edema.  GASTROINTESTINAL: No nausea, vomiting, diarrhea or abdominal pain.  GENITOURINARY: No dysuria, hematuria.  ENDOCRINE: No polyuria, nocturia. HEMATOLOGY: No bleeding. SKIN: No rash or lesion. MUSCULOSKELETAL: No joint pain at this time.   NEUROLOGIC: No focal weakness.  PSYCHIATRY: No anxiety or depression.   MEDICATIONS AT HOME:  Prior to Admission medications   Medication Sig Start Date End Date Taking? Authorizing Provider  acetaminophen (TYLENOL) 500 MG tablet Take 1,000 mg by mouth every 8 (eight) hours as needed. For 3-4 days for pain   Yes [provider]  albuterol (PROAIR HFA) 108 (90 Base) MCG/ACT inhaler INHALE TWO PUFFS EVERY 4-6 HOURS AS NEEDED FOR COUGH OR WHEEZE 01/18/18  Yes Kozlow, Alvira Philips, MD  amiodarone (PACERONE) 200 MG tablet Take 1 tablet (200 mg total) by mouth 2 (two) times daily. 08/26/17  Yes Lennette Bihari, MD  amLODipine (NORVASC) 5 MG tablet Take 5 mg twice a day 02/11/18  Yes Lennette Bihari, MD  aspirin EC 81 MG tablet Take 1 tablet (81 mg total) by mouth every evening. 11/25/16  Yes Laverda Page B, NP  atorvastatin (LIPITOR) 20 MG tablet Take 20 mg by mouth every evening.  12/23/15  Yes [provider]  Cholecalciferol (VITAMIN D-3 PO) Take 1,000 Units by mouth every evening.    Yes [provider]  ELIQUIS 5 MG TABS tablet TAKE 1 TABLET BY MOUTH TWICE A DAY 02/11/18  Yes Lennette Bihari, MD  ezetimibe (ZETIA) 10 MG tablet TAKE 1 TABLET (10 MG TOTAL) BY MOUTH DAILY. 09/27/17  Yes Lennette Bihari, MD  fenofibrate (TRICOR) 145 MG tablet Take 72.5 mg by mouth daily.  12/10/15  Yes [provider]  hydrALAZINE (APRESOLINE) 25 MG tablet Take 25 mg by mouth 2 (two) times daily.   Yes [provider]  ipratropium (ATROVENT) 0.06 % nasal spray Can use two sprays in each nostril every six hours as needed to dry up nose. 01/18/18  Yes Kozlow, Alvira Philips, MD  isosorbide mononitrate (IMDUR) 60 MG 24 hr tablet Take 2 tablets (120 mg total) by mouth every morning AND 0.5 tablets (30 mg total) every evening. 01/26/18  Yes Lennette Bihari, MD  loratadine (CLARITIN) 10 MG tablet Take 10 mg by mouth every evening.    Yes [provider]  Metoprolol Succinate 25 MG CS24 Take 25 mg by mouth 2 (two) times daily. 02/22/18  Yes Lennette Bihari, MD  nitroGLYCERIN (NITROLINGUAL) 0.4 MG/SPRAY spray PLACE 1 SPRAY UNDER THE TONGUE EVERY 5 (FIVE) MINUTES X 3 DOSES AS NEEDED FOR CHEST PAIN. 01/11/18  Yes Kilroy, Luke K, PA-C  Omega-3 Fatty Acids (FISH OIL) 1000 MG CAPS  Take 1 capsule by mouth 2 (two) times daily.    Yes [provider]  pantoprazole (PROTONIX) 40 MG tablet Take 1 tablet (40 mg total) by mouth 2 (two) times daily. Patient taking differently: Take 40 mg by mouth daily.  01/18/18  Yes Kozlow, Alvira Philips, MD  ranolazine (RANEXA) 500 MG 12 hr tablet Take 250 mg by mouth 2 (two) times daily.    Yes [provider]  telmisartan (MICARDIS) 80 MG tablet Take 1/2 tablet daily 02/11/18  Yes Lennette Bihari, MD  torsemide [facility]) 20 MG tablet Take 1/2 tablet daily 02/11/18  Yes Lennette Bihari, MD  vitamin C (ASCORBIC ACID) 500 MG tablet Take 500 mg by mouth daily.   Yes [provider]  vitamin E 400 UNIT capsule Take 400 Units by mouth daily.   Yes [provider]  zinc gluconate 50 MG tablet Take 50 mg by mouth 2 (two) times a week.   Yes [provider]      PHYSICAL EXAMINATION:   VITAL SIGNS: Blood pressure 130/62, pulse (!) 53, temperature (!) 97.5 F (36.4 C), resp. rate (!) 25, height 5\' 8"  (1.727 m), weight 91.6 kg, SpO2 96 %.  GENERAL:  82 y.o.-year-old patient lying in the bed with no acute distress.  EYES: Pupils equal, round, reactive to  light and accommodation. No scleral icterus. Extraocular muscles intact.  HEENT: Head atraumatic, normocephalic. Oropharynx and nasopharynx clear.  NECK:  Supple, no jugular venous distention. No thyroid enlargement, no tenderness.  LUNGS: Normal breath sounds bilaterally, no wheezing, rales,rhonchi or crepitation. No use of accessory muscles of respiration.  CARDIOVASCULAR: Irregularly irregular heart rate.  S1, S2 normal. No S3/S4.  ABDOMEN: Soft, nontender, nondistended. Bowel sounds present. No organomegaly or mass.  EXTREMITIES: No pedal edema, cyanosis, or clubbing.  NEUROLOGIC: Cranial nerves II through XII are intact. Muscle strength 5/5 in all extremities. Sensation intact.   PSYCHIATRIC: The patient is alert and oriented x 3.  SKIN: No obvious rash, lesion, or ulcer.   LABORATORY PANEL:   CBC Recent Labs  Lab 03/02/18 2054  WBC 6.4  HGB 10.3*  HCT 32.1*  PLT 195  MCV 96.7  MCH 31.0  MCHC 32.1  RDW 15.8*  LYMPHSABS 0.8  MONOABS 1.1*  EOSABS 0.2  BASOSABS 0.0   ------------------------------------------------------------------------------------------------------------------  Chemistries  Recent Labs  Lab 03/02/18 2054  NA 139  K 4.1  CL 109  CO2 21*  GLUCOSE 137*  BUN 29*  CREATININE 1.91*  CALCIUM 8.7*   ------------------------------------------------------------------------------------------------------------------ estimated creatinine clearance is 34.5 mL/min (A) (by C-G formula based on SCr of 1.91 mg/dL (H)). ------------------------------------------------------------------------------------------------------------------ No results for input(s): TSH, T4TOTAL, T3FREE, THYROIDAB in the last 72 hours.  Invalid input(s): FREET3   Coagulation profile Recent Labs  Lab 03/02/18 2054  INR 1.59   ------------------------------------------------------------------------------------------------------------------- No results for input(s): DDIMER in the  last 72 hours. -------------------------------------------------------------------------------------------------------------------  Cardiac Enzymes Recent Labs  Lab 03/02/18 2054  TROPONINI <0.03   ------------------------------------------------------------------------------------------------------------------ Invalid input(s): POCBNP  ---------------------------------------------------------------------------------------------------------------  Urinalysis    Component Value Date/Time   COLORURINE YELLOW 03/02/2018 2215   APPEARANCEUR CLEAR 03/02/2018 2215   LABSPEC 1.020 03/02/2018 2215   PHURINE 5.5 03/02/2018 2215   GLUCOSEU NEGATIVE 03/02/2018 2215   HGBUR NEGATIVE 03/02/2018 2215   BILIRUBINUR NEGATIVE 03/02/2018 2215   KETONESUR NEGATIVE 03/02/2018 2215   PROTEINUR NEGATIVE 03/02/2018 2215   NITRITE NEGATIVE 03/02/2018 2215   LEUKOCYTESUR NEGATIVE 03/02/2018 2215     RADIOLOGY: Dg Chest Portable 1  View  Result Date: 03/02/2018 CLINICAL DATA:  Chest pain and dizziness. EXAM: PORTABLE CHEST 1 VIEW COMPARISON:  08/06/2016 FINDINGS: Sternotomy wires unchanged. Lungs are adequately inflated with mild prominence of the perihilar markings likely mild vascular congestion. Possible hazy opacification in the left base. Stable cardiomegaly. Remainder of the exam is unchanged. IMPRESSION: Possible small left effusion/atelectasis versus infection. Cardiomegaly with mild vascular congestion. Electronically Signed   By: Elberta Fortis M.D.   On: 03/02/2018 21:15    EKG: Orders placed or performed during the hospital encounter of 03/02/18  . EKG 12-Lead  . EKG 12-Lead    IMPRESSION AND PLAN:  1.  Near syncopal episode, likely related to bradycardia.  We will hold beta-blocker and decrease amiodarone dose from 200 mg twice a day to 200 mg daily.  Continue to monitor patient on telemetry.  Cardiology is consulted for further evaluation and treatment. 2. Symptomatic bradycardia,  likely related to medications.  See management as above under #1. 3. CKD 3.  Creatinine level is at baseline.  Need to monitor kidney function closely and avoid nephrotoxic medications. 4.  CAD, continue medical treatment, as per cardiology. 5.  Proximal atrial fibrillation, currently with bradycardia.  Will hold beta-blocker and adjust amiodarone dose.  Continue anticoagulation with Eliquis, for stroke prevention. 6.  Hypertension, stable.  Continue home medications except for beta-blocker. 7.  Hyperlipidemia, on statin. 8.  Anemia of chronic diseases.  Hemoglobin level is at baseline.  Continue to monitor closely.   All the records are reviewed and case discussed with ED provider. Management plans discussed with the patient, family and they are in agreement.  CODE STATUS: Full Code Status History    Date Active Date Inactive Code Status Order ID Comments User Context   11/24/2016 1817 11/25/2016 1725 Full Code [id number]  Lennette Bihari, MD Inpatient   11/20/2015 1407 11/23/2015 1927 Full Code [id number]  Lyn Records, MD ED   01/22/2015 0606 01/22/2015 2104 Full Code [id number]  Alberteen Sam, MD Inpatient   09/24/2014 1845 09/26/2014 1911 Full Code [id number]  Russella Dar, NP Inpatient       TOTAL TIME TAKING CARE OF THIS PATIENT: 50 minutes.    Cammy Copa M.D on 03/03/2018 at 12:58 AM  Between 7am to 6pm - Pager - [phone]  After 6pm go to [email or web address] - password [email or web address] [facility] [place] [facility]  [phone]  CC: Primary care physician; Chilton Greathouse, MD

## 2018-03-03 NOTE — Progress Notes (Signed)
Pt home equipment was checked out by RT. Wires and machine are in good shape, functions properly. First night being used and is plugged into red outlet. Note will be sent to biomed to check said equipment tomorrow if pt is not discharged.

## 2018-03-03 NOTE — Progress Notes (Signed)
SATURATION QUALIFICATIONS: (This note is used to comply with regulatory documentation for home oxygen)  Patient Saturations on Room Air at Rest = 95%  Patient Saturations on Room Air while Ambulating = 96-98%  Patient Saturations on n/a Liters of oxygen while Ambulating = N/A  Pt ambulated in hallway.  O2 Sat 95-98% while ambulating, HR 58-62 bpm while ambulating.

## 2018-03-03 NOTE — Consult Note (Signed)
Cardiology Consult    Patient ID: MATS JEANLOUIS MRN: [id number], DOB/AGE: [birth year]   Admit date: 03/02/2018 Date of Consult: 03/03/2018  Primary Physician: Chilton Greathouse, MD Primary Cardiologist: Nicki Guadalajara, MD Requesting Provider: Kathie Rhodes. Allena Katz, MD  Patient Profile    Jorge Cherry is a 82 y.o. male with a history of CAD s/p CABG and subsequent PCI w/ chronic/stable angina, labile HTN, HL, PAF on amio/eliquis, prior GIB, IDA, GERD, and moderate Ao Stenosis, who is being seen today for the evaluation of syncope and bradycardia at the request of Dr. Allena Katz.  Past Medical History   Past Medical History:  Diagnosis Date  . Arthritis    "just a touch in my hands" (11/24/2016)  . Atherosclerosis of renal artery (HCC)    RENAL DOPPLER, 12/10/2011 - Left renal artery demonstrated narrowing with elevated velocities consistent with a 1-59% diameter reduction  . CKD (chronic kidney disease) stage 3, GFR 30-59 ml/min (HCC)    "stable now since they backed off the water pills" (11/24/2016)  . Coronary artery disease    a. 1994 s/p CABG x 4 (LIMA-LAD, VG->D2, VG->OM, VG->RCA); b. 02/2004 PCI SVG-D2 (3.5x16 Taxus DES). VG->RCA 100. Sev apical LAD dzs distal to LIMA insertion; c. 11/2016 Cath: LAD 95p/174m, D2 100ost, LCX 100ost, 70p/m, RCA 100p/m, RPDA fills via L->R collats. VG->RCA 100, VG->D2 20 ost, patent prox stent, 28m, LIMA->LAD ok, VG->OM3 20p. EF 55%-->Med Rx.  . GERD (gastroesophageal reflux disease)   . High cholesterol   . History of lower GI bleeding    a. 09/2014 GIB due to diverticulosis/diverticulitis.  . Iron deficiency anemia   . Labile Hypertension   . Moderate aortic stenosis    a. 10/2011 Echo:  EF >55%, LA mild-moderately dilated, mild-moderate tricuspid regurgitation, mild-moderate valvular aortic stenosis, moderate calcification of aortic valve leaflets; b. 07/2016 Echo: EF 60-65%, no rwma, Gr1 DD, mod AS [(S) mean grad , peak grad . Valve area (VTI): 1.33cm^2,  (Vmax) 1.44cm^2. Mild MR].  [name] PAF (paroxysmal atrial fibrillation) (HCC)    a. CHA2DS2VASc = 4-->Eliquis.    Past Surgical History:  Procedure Laterality Date  . CARDIAC CATHETERIZATION  02/27/2004   Coronary intervention and medical management  . CARDIAC CATHETERIZATION  11/24/2016  . CATARACT EXTRACTION W/ INTRAOCULAR LENS IMPLANT Left   . CORONARY ANGIOPLASTY  1994 X 2   "before bypass surgery"  . CORONARY ANGIOPLASTY WITH STENT PLACEMENT  03/04/2004   SVG supplying the diagonal vessel stented with a 3.5x45mm Taxus stent post dilated to 4.0 mm  . CORONARY ARTERY BYPASS GRAFT  1994   "CABG X4"  . INGUINAL HERNIA REPAIR Right   . RIGHT HEART CATH AND CORONARY/GRAFT ANGIOGRAPHY N/A 11/24/2016   Procedure: Right Heart Cath and Coronary/Graft Angiography;  Surgeon: Lennette Bihari, MD;  Location: [facility] INVASIVE CV LAB;  Service: Cardiovascular;  Laterality: N/A;  . TONSILLECTOMY AND ADENOIDECTOMY       Allergies  Allergies  Allergen Reactions  . Altace [Ramipril] Other (See Comments)    Mouth swelling  . Mucinex [Guaifenesin Er] Hives and Other (See Comments)    Mouth swelling  . Contrast Media [Iodinated Diagnostic Agents] Other (See Comments)    Made eyes change each time    History of Present Illness    82 y/o ? with the above complex past medical history including coronary artery disease status post four-vessel bypass in 1994 with subsequent PCI, labile hypertension, hyperlipidemia, paroxysmal atrial fibrillation on amiodarone and Eliquis, prior GI bleed in the  setting of diverticulosis and diverticulitis, iron deficiency anemia, GERD, OSA, and moderate aortic stenosis.  As noted, he underwent four-vessel bypass in 1994 and then subsequently in October 2005 required PCI and drug-eluting stent placement to the vein graft  D2.  At the time, the vein graft to the RCA was noted to be occluded with left-to-right collaterals to the PDA.  Further, the LIMA to the LAD was patent with a  moderate stenosis distal to the anastomosis followed by severe stenosis of the apical LAD.  This is been medically managed over the years with nitrate and subsequently Ranexa therapy.  His last catheterization was performed in July 2018, revealing stable disease with 3 of 4 patent grafts and continue medical therapy was recommended.  Of note, he was found to be anemic prior to that catheterization and he subsequently underwent GI evaluation and has since been treated for iron deficiency anemia.  With stabilization of hemoglobin, he has had symptomatic improvement.    In the setting of anemia along with a history of GI bleed/diverticulitis, patient has been on and off antiplatelet and also oral anticoagulant therapy.  As above, H&H most recently been stable and he has been on Eliquis therapy secondary to paroxysmal atrial fibrillation.  He was last seen in cardiology clinic in September, at which time he reported intermittent chest discomfort and his isosorbide therapy was increased to 120 mg in the morning and 30 mg in the evening.  Low-dose beta-blocker therapy was also added.  He follow-up for a blood pressure check on October 15 and pressure was in the 170s.  Hydralazine 25 mg twice daily was added.  He says that following the addition of hydralazine, he was doing well and pressures were maintaining in the 140s.  He was in his usual state of until the evening of October 23, when he was walking outside of his daughter's apartment.  It was dark and he tripped over a root of a tree.  This caused him to stumble forward and fall flat on his chest.  He says he is certain he did not lose consciousness and it was simply a mechanical fall.  It happened very quickly however and he was not able to get his arms out in front of him.  He did not strike his face or head.  While he was lying on the ground, he sat up and noted that he was started to feel dizzy.  He was not experiencing any chest pain, nausea, dyspnea, or  diaphoresis at that point.  He sat there for a minute or so and felt better and then stood up and started walking again.  He had recurrent dizziness and says he felt as though he might pass out and so he sat down immediately.  Again, he sat for a few minutes and then once he felt a little bit better he walked to his daughter's apartment.  Once inside, his family noted that he was pale and he was again very lightheaded/dizzy.  They assisted him to the couch where he sat and became nauseated and his wife noted that he was clammy and diaphoretic.  Again, he was not having any chest pain or dyspnea.  EMS was called and upon EMS arrival, heart rates were in the 30s.  We were able to review those rhythm strips and a twelve-lead ECG.  There is a significant baseline artifact though I suspect that this was sinus bradycardia versus a junctional rhythm.  He was treated with atropine and the  initial blood pressure recording is 8 minutes after the first ECG.  Blood pressure was stable by that time.  Patient was taken to the [facility] ED where ECG showed sinus bradycardia with a right bundle branch block and left anterior fascicular block and prolonged QT.  This was stable compared to prior ECGs.  Lab work was notable for mild but stable anemia and a normal troponin.  Patient was admitted for further evaluation.  Amiodarone dose was reduced from 200 twice daily to 10 mg daily and beta-blocker was held.  Patient maintained sinus rhythm overnight with rates mostly in the 50s.  No significant pauses or evidence of high-grade heart block.  He is currently asymptomatic and hemodynamically stable.  Inpatient Medications    . [START ON 03/06/2018] amiodarone  200 mg Oral Daily  . amLODipine  5 mg Oral Daily  . apixaban  5 mg Oral BID  . aspirin EC  81 mg Oral QPM  . atorvastatin  20 mg Oral QPM  . cholecalciferol  1,000 Units Oral QPM  . docusate sodium  100 mg Oral BID  . ezetimibe  10 mg Oral Daily  . fenofibrate  54 mg  Oral Daily  . hydrALAZINE  25 mg Oral BID  . irbesartan  300 mg Oral Daily  . isosorbide mononitrate  120 mg Oral Daily  . loratadine  10 mg Oral QPM  . omega-3 acid ethyl esters  1 g Oral Daily  . pantoprazole  40 mg Oral Daily  . ranolazine  500 mg Oral BID  . torsemide  10 mg Oral Daily  . vitamin C  500 mg Oral Daily  . vitamin E  400 Units Oral Daily  . zinc sulfate  220 mg Oral Once per day on Mon Thu    Family History    Family History  Problem Relation Age of Onset  . Cancer Mother   . Heart disease Father        died age 38  . Stroke Maternal Grandmother   . Cancer Maternal Grandfather    He indicated that his mother is deceased. He indicated that his father is deceased. He indicated that his maternal grandmother is deceased. He indicated that his maternal grandfather is deceased.   Social History    Social History   Socioeconomic History  . Marital status: Married    Spouse name: Not on file  . Number of children: 2  . Years of education: Not on file  . Highest education level: Not on file  Occupational History  . Not on file  Social Needs  . Financial resource strain: Not on file  . Food insecurity:    Worry: Not on file    Inability: Not on file  . Transportation needs:    Medical: Not on file    Non-medical: Not on file  Tobacco Use  . Smoking status: Never Smoker  . Smokeless tobacco: Never Used  Substance and Sexual Activity  . Alcohol use: No    Alcohol/week: 0.0 standard drinks  . Drug use: No  . Sexual activity: Not Currently  Lifestyle  . Physical activity:    Days per week: Not on file    Minutes per session: Not on file  . Stress: Not on file  Relationships  . Social connections:    Talks on phone: Not on file    Gets together: Not on file    Attends religious service: Not on file    Active member of club  or organization: Not on file    Attends meetings of clubs or organizations: Not on file    Relationship status: Not on file    . Intimate partner violence:    Fear of current or ex partner: Not on file    Emotionally abused: Not on file    Physically abused: Not on file    Forced sexual activity: Not on file  Other Topics Concern  . Not on file  Social History Narrative   Lives at home in [place] with wife.  Retired from [employer].       Review of Systems    General:  No chills, fever, night sweats or weight changes.  Cardiovascular:  +++ presyncope. +++ bradycardia.  No chest pain, dyspnea on exertion, edema, orthopnea, palpitations, paroxysmal nocturnal dyspnea. Dermatological: No rash, lesions/masses Respiratory: No cough, dyspnea Urologic: No hematuria, dysuria Abdominal:   +++ nausea in the setting of bradycardia on the evening of admission.  No vomiting, diarrhea, bright red blood per rectum, melena, or hematemesis Neurologic:  No visual changes, wkns, changes in mental status. All other systems reviewed and are otherwise negative except as noted above.  Physical Exam    Blood pressure (!) 147/70, pulse (!) 52, temperature 98.7 F (37.1 C), temperature source Oral, resp. rate 18, height 5\' 8"  (1.727 m), weight 91.6 kg, SpO2 94 %.  General: Pleasant, NAD Psych: Normal affect. Neuro: Alert and oriented X 3. Moves all extremities spontaneously. HEENT: Normal  Neck: Supple without JVD.  Radiated AS murmur to bilat carotids. Lungs:  Resp regular and unlabored, CTA. Heart: RRR, bradycardiac, 3/6 SEM heard throughout - loudest @ upper sternal borders.  No s3, s4. Abdomen: Soft, non-tender, non-distended, BS + x 4.  Extremities: No clubbing, cyanosis.  Trace left lower ext edema w/ 1+ right lower ext edema. DP/PT/Radials 1+ and equal bilaterally.  Labs     Recent Labs    03/02/18 2054  TROPONINI <0.03   Lab Results  Component Value Date   WBC 6.8 03/03/2018   HGB 10.7 (L) 03/03/2018   HCT 33.8 (L) 03/03/2018   MCV 97.4 03/03/2018   PLT 216 03/03/2018    Recent Labs  Lab  03/03/18 0751  NA 141  K 3.9  CL 109  CO2 25  BUN 24*  CREATININE 1.57*  CALCIUM 8.7*  GLUCOSE 108*   Lab Results  Component Value Date   CHOL 106 07/01/2016   HDL 35 (L) 07/01/2016   LDLCALC 47 07/01/2016   TRIG 120 07/01/2016    Radiology Studies    Dg Chest Portable 1 View  Result Date: 03/02/2018 CLINICAL DATA:  Chest pain and dizziness. EXAM: PORTABLE CHEST 1 VIEW COMPARISON:  08/06/2016 FINDINGS: Sternotomy wires unchanged. Lungs are adequately inflated with mild prominence of the perihilar markings likely mild vascular congestion. Possible hazy opacification in the left base. Stable cardiomegaly. Remainder of the exam is unchanged. IMPRESSION: Possible small left effusion/atelectasis versus infection. Cardiomegaly with mild vascular congestion. Electronically Signed   By: Elberta Fortis M.D.   On: 03/02/2018 21:15    ECG & Cardiac Imaging    Sinus bradycardia, 58, LAD, LAFB, RBBB, prolonged QT.  No acute changes - personally reviewed.  Assessment & Plan    1.  Presyncope:  Pt presented to the ED on 10/23 following episodes of presyncope that started after a mechanical fall (tripped over a tree root) and struck his chest on the ground.  Dizziness started very shortly after falling.  He is certain that he was not experiencing presyncope prior to fall and at no point did he lose consciousness.  Presyncope worsened each time he stood and his family called EMS.  He did experience nausea and diaphoresis and was subsequently found to be profoundly bradycardic - likely sinus brady but b/c of wavy baseline, cannot be sure that this wasn't slow afib vs jxnl.  He was given atropine and within 10 mins, he was clearly in sinus on f/u ecg.  Initial recorded BP on EMS record (reviewed in detail) was 124/57, though this was 8 mins after the first rhythm strip (HR up to 42 by the time BP recorded).  In ED here, no acute changes on ECG.  QT is prolonged however this has been noted before.  No  events on tele and trop nl x 1.  No recurrent presyncope.  Etiology not entirely clear but presentation could represent vasovagal event following fall and striking his chest vs primary bradycardic event in the setting of amiodarone/recetnly added  blocker vs sequelae of progressive aortic stenosis.  Echo pending.  Follow on tele today.  Agree with holding  blocker and reducing amio to 200 daily.  Ambulate and assess HR for chronotropic response - if poor, will require EP eval.  2.  Prolonged QT:  In setting of amio therapy.  This has been prolonged on serial ECG's recently.  Amio dose reduced to 200mg  daily.  3.  CAD:  S/p prior CABG and PCI of VG  D2 in 2005.  Last cath in 11/2016 showed stable dzs and he has been medically managed.  No chest pain.  Trop nl x 1.  Cont statin and nitrate.  Hold  blocker.  He is on both ASA and eliquis per Dr. Tresa Endo - we will need to consider d/c'ing ASA in setting of prior GIB and IDA.  4.  Essential HTN:  Relatively stable - SBP 147.  Cont ARB, CCB, nitrate, hydralazine, and diuretic.  5.  HL:  LDL 47 in 06/2016.  Will need outpt f/u.  Cont statin/zeita/fish oil.  6.  CKD III:  Stable.  7.  PAF:  Maintaining sinus.  Cont low dose amio.  Cont eliquis.  8.  Iron Deficiency Anemia:  Stable.  Signed, Nicolasa Ducking, NP 03/03/2018, 11:28 AM  For questions or updates, please contact   Please consult [email or web address] for contact info under Cardiology/STEMI.

## 2018-03-03 NOTE — Plan of Care (Signed)

## 2018-03-03 NOTE — Progress Notes (Signed)
[employer] -  at [facility]                                                                                                                                                                                  Patient Demographics   Jorge Cherry, is a 82 y.o. male, DOB - Oct 01, 1935, VHQ:[id number]  Admit date - 03/02/2018   Admitting Physician Cammy Copa, MD  Outpatient Primary MD for the patient is Avva, Ravisankar, MD   LOS - 0  Subjective: Patient admitted bradycardia and syncope she needs to be bradycardic. Feeling much better  Review of Systems:   CONSTITUTIONAL: No documented fever. No fatigue, weakness. No weight gain, no weight loss.  EYES: No blurry or double vision.  ENT: No tinnitus. No postnasal drip. No redness of the oropharynx.  RESPIRATORY: No cough, no wheeze, no hemoptysis. No dyspnea.  CARDIOVASCULAR: No chest pain. No orthopnea. No palpitations. No syncope.  GASTROINTESTINAL: No nausea, no vomiting or diarrhea. No abdominal pain. No melena or hematochezia.  GENITOURINARY: No dysuria or hematuria.  ENDOCRINE: No polyuria or nocturia. No heat or cold intolerance.  HEMATOLOGY: No anemia. No bruising. No bleeding.  INTEGUMENTARY: No rashes. No lesions.  MUSCULOSKELETAL: No arthritis. No swelling. No gout.  NEUROLOGIC: No numbness, tingling, or ataxia. No seizure-type activity.  PSYCHIATRIC: No anxiety. No insomnia. No ADD.    Vitals:   Vitals:   03/03/18 0153 03/03/18 0618 03/03/18 0754 03/03/18 1009  BP: 139/80 (!) 103/58 136/68 (!) 147/70  Pulse: (!) 52 (!) 50 (!) 51 (!) 52  Resp: 20 (!) 22 18   Temp: 97.8 F (36.6 C) 98.2 F (36.8 C) 98.7 F (37.1 C)   TempSrc: Oral Oral Oral   SpO2: 97% 94% 94%   Weight:      Height:        Wt Readings from Last 3 Encounters:  03/02/18 91.6 kg  02/10/18 93 kg  01/26/18 91.6 kg     Intake/Output Summary (Last 24 hours) at 03/03/2018 1436 Last data filed at 03/03/2018 1300 Gross per 24  hour  Intake -  Output 650 ml  Net -650 ml    Physical Exam:   GENERAL: Pleasant-appearing in no apparent distress.  HEAD, EYES, EARS, NOSE AND THROAT: Atraumatic, normocephalic. Extraocular muscles are intact. Pupils equal and reactive to light. Sclerae anicteric. No conjunctival injection. No oro-pharyngeal erythema.  NECK: Supple. There is no jugular venous distention. No bruits, no lymphadenopathy, no thyromegaly.  HEART: Regular rate and rhythm,. No murmurs, no rubs, no clicks.  LUNGS: Clear to auscultation bilaterally. No rales or rhonchi. No wheezes.  ABDOMEN: Soft, flat, nontender, nondistended. Has good bowel sounds. No  hepatosplenomegaly appreciated.  EXTREMITIES: No evidence of any cyanosis, clubbing, or peripheral edema.  +2 pedal and radial pulses bilaterally.  NEUROLOGIC: The patient is alert, awake, and oriented x3 with no focal motor or sensory deficits appreciated bilaterally.  SKIN: Moist and warm with no rashes appreciated.  Psych: Not anxious, depressed LN: No inguinal LN enlargement    Antibiotics   Anti-infectives (From admission, onward)   None      Medications   Scheduled Meds: . [START ON 03/06/2018] amiodarone  200 mg Oral Daily  . amLODipine  5 mg Oral Daily  . apixaban  5 mg Oral BID  . aspirin EC  81 mg Oral QPM  . atorvastatin  20 mg Oral QPM  . cholecalciferol  1,000 Units Oral QPM  . docusate sodium  100 mg Oral BID  . ezetimibe  10 mg Oral Daily  . fenofibrate  54 mg Oral Daily  . hydrALAZINE  25 mg Oral BID  . irbesartan  300 mg Oral Daily  . isosorbide mononitrate  120 mg Oral Daily  . loratadine  10 mg Oral QPM  . omega-3 acid ethyl esters  1 g Oral Daily  . pantoprazole  40 mg Oral Daily  . ranolazine  500 mg Oral BID  . torsemide  10 mg Oral Daily  . vitamin C  500 mg Oral Daily  . vitamin E  400 Units Oral Daily  . zinc sulfate  220 mg Oral Once per day on Mon Thu   Continuous Infusions: PRN Meds:.acetaminophen **OR**  acetaminophen, albuterol, bisacodyl, HYDROcodone-acetaminophen, nitroGLYCERIN, ondansetron **OR** ondansetron (ZOFRAN) IV, traZODone   Data Review:   Micro Results No results found for this or any previous visit (from the past 240 hour(s)).  Radiology Reports Dg Chest Portable 1 View  Result Date: 03/02/2018 CLINICAL DATA:  Chest pain and dizziness. EXAM: PORTABLE CHEST 1 VIEW COMPARISON:  08/06/2016 FINDINGS: Sternotomy wires unchanged. Lungs are adequately inflated with mild prominence of the perihilar markings likely mild vascular congestion. Possible hazy opacification in the left base. Stable cardiomegaly. Remainder of the exam is unchanged. IMPRESSION: Possible small left effusion/atelectasis versus infection. Cardiomegaly with mild vascular congestion. Electronically Signed   By: Elberta Fortis M.D.   On: 03/02/2018 21:15     CBC Recent Labs  Lab 03/02/18 2054 03/03/18 0751  WBC 6.4 6.8  HGB 10.3* 10.7*  HCT 32.1* 33.8*  PLT 195 216  MCV 96.7 97.4  MCH 31.0 30.8  MCHC 32.1 31.7  RDW 15.8* 15.9*  LYMPHSABS 0.8  --   MONOABS 1.1*  --   EOSABS 0.2  --   BASOSABS 0.0  --     Chemistries  Recent Labs  Lab 03/02/18 2054 03/03/18 0751  NA 139 141  K 4.1 3.9  CL 109 109  CO2 21* 25  GLUCOSE 137* 108*  BUN 29* 24*  CREATININE 1.91* 1.57*  CALCIUM 8.7* 8.7*   ------------------------------------------------------------------------------------------------------------------ estimated creatinine clearance is 41.9 mL/min (A) (by C-G formula based on SCr of 1.57 mg/dL (H)). ------------------------------------------------------------------------------------------------------------------ No results for input(s): HGBA1C in the last 72 hours. ------------------------------------------------------------------------------------------------------------------ No results for input(s): CHOL, HDL, LDLCALC, TRIG, CHOLHDL, LDLDIRECT in the last 72  hours. ------------------------------------------------------------------------------------------------------------------ No results for input(s): TSH, T4TOTAL, T3FREE, THYROIDAB in the last 72 hours.  Invalid input(s): FREET3 ------------------------------------------------------------------------------------------------------------------ No results for input(s): VITAMINB12, FOLATE, FERRITIN, TIBC, IRON, RETICCTPCT in the last 72 hours.  Coagulation profile Recent Labs  Lab 03/02/18 2054  INR 1.59    No results for input(s):  DDIMER in the last 72 hours.  Cardiac Enzymes Recent Labs  Lab 03/02/18 2054  TROPONINI <0.03   ------------------------------------------------------------------------------------------------------------------ Invalid input(s): POCBNP    Assessment & Plan   1.  Near syncopal episode, due to bradycardia   continue to hold beta-blocker and  Continue to monitor patient on telemetry.  Cardiology is consulted for further evaluation and treatment. 2. Symptomatic bradycardia, likely related to medications.    Cardiologist seen and patient await echo of the heart 3. CKD 3.  Creatinine level is at baseline.  Need to monitor kidney function closely and avoid nephrotoxic medications. 4.  CAD, continue medical treatment, as per cardiology. 5.  Proximal atrial fibrillation, currently with bradycardia.    Continue Eliquis  6.  Hypertension, stable.  Continue home medications except for beta-blocker. 7.  Hyperlipidemia, on statin. 8.  Anemia of chronic diseases.  Hemoglobin level is at baseline.  Continue to monitor closely.      Code Status Orders  (From admission, onward)         Start     Ordered   03/03/18 0150  Full code  Continuous     03/03/18 0150        Code Status History    Date Active Date Inactive Code Status Order ID Comments User Context   11/24/2016 1817 11/25/2016 1725 Full Code [id number]  Lennette Bihari, MD Inpatient   11/20/2015 1407  11/23/2015 1927 Full Code [id number]  Lyn Records, MD ED   01/22/2015 0606 01/22/2015 2104 Full Code [id number]  Alberteen Sam, MD Inpatient   09/24/2014 1845 09/26/2014 1911 Full Code [id number]  Russella Dar, NP Inpatient           Consults cardiac  DVT Prophylaxis lovenox  Lab Results  Component Value Date   PLT 216 03/03/2018     Time Spent in minutes   1 to 145 pm  Greater than 50% of time spent in care coordination and counseling patient regarding the condition and plan of care.   Auburn Bilberry M.D on 03/03/2018 at 2:36 PM  Between 7am to 6pm - Pager - [phone]  After 6pm go to [email or web address] - Social research officer, government  [employer]   Office  [phone]

## 2018-03-03 NOTE — Progress Notes (Signed)
*  PRELIMINARY RESULTS* Echocardiogram 2D Echocardiogram has been performed.  Cristela Blue 03/03/2018, 3:40 PM

## 2018-03-04 ENCOUNTER — Other Ambulatory Visit: Payer: Self-pay | Admitting: *Deleted

## 2018-03-04 ENCOUNTER — Encounter: Payer: Self-pay | Admitting: Nurse Practitioner

## 2018-03-04 ENCOUNTER — Inpatient Hospital Stay (INDEPENDENT_AMBULATORY_CARE_PROVIDER_SITE_OTHER): Payer: Medicare Other

## 2018-03-04 DIAGNOSIS — I4949 Other premature depolarization: Secondary | ICD-10-CM

## 2018-03-04 DIAGNOSIS — I495 Sick sinus syndrome: Secondary | ICD-10-CM

## 2018-03-04 DIAGNOSIS — R55 Syncope and collapse: Secondary | ICD-10-CM

## 2018-03-04 DIAGNOSIS — I480 Paroxysmal atrial fibrillation: Secondary | ICD-10-CM | POA: Diagnosis not present

## 2018-03-04 DIAGNOSIS — W19XXXA Unspecified fall, initial encounter: Secondary | ICD-10-CM | POA: Diagnosis not present

## 2018-03-04 MED ORDER — AMIODARONE HCL 200 MG PO TABS: 200.0000 mg | ORAL_TABLET | Freq: Every day | ORAL | 3 refills | Status: DC

## 2018-03-04 NOTE — Care Management Note (Signed)
Case Management Note  Patient Details  Name: Jorge Cherry MRN: [id number] Date of Birth: 27-Oct-1935  Subjective/Objective:     Patient from home with wife; admitted for bradycardia. Independent from home.  Chronic Eliquis.  Current with PCP.  Denies difficulties obtaining medications or with transportation.   No services in the home.  Currently on room air.  No needs identified at this time by [facility].              Action/Plan: Discharging to home today with wife.     Expected Discharge Date:  03/04/18               Expected Discharge Plan:  Home/Self Care  In-House Referral:     Discharge planning Services  CM Consult  Post Acute Care Choice:    Choice offered to:     DME Arranged:    DME Agency:     HH Arranged:    HH Agency:     Status of Service:  Completed, signed off  If discussed at [employer] of Stay Meetings, dates discussed:    Additional Comments:  Sherren Kerns, RN 03/04/2018, 2:29 PM

## 2018-03-04 NOTE — Progress Notes (Signed)
Progress Note  Patient Name: Jorge Cherry Date of Encounter: 03/04/2018  Primary Cardiologist: Nicki Guadalajara, MD  Subjective   No chest pain, dyspnea, presyncope/syncope.  No events on tele.  Ambulated yesterday with rise in HR into the 60's.  Stable O2 sats throughout and he was asymptomatic.  Echo showed stable, mod AS.   Inpatient Medications    Scheduled Meds: . [START ON 03/06/2018] amiodarone  200 mg Oral Daily  . amLODipine  5 mg Oral Daily  . apixaban  5 mg Oral BID  . aspirin EC  81 mg Oral QPM  . atorvastatin  20 mg Oral QPM  . cholecalciferol  1,000 Units Oral QPM  . docusate sodium  100 mg Oral BID  . ezetimibe  10 mg Oral Daily  . fenofibrate  54 mg Oral Daily  . hydrALAZINE  25 mg Oral BID  . irbesartan  300 mg Oral Daily  . isosorbide mononitrate  120 mg Oral Daily  . loratadine  10 mg Oral QPM  . omega-3 acid ethyl esters  1 g Oral Daily  . pantoprazole  40 mg Oral Daily  . ranolazine  500 mg Oral BID  . torsemide  10 mg Oral Daily  . vitamin C  500 mg Oral Daily  . vitamin E  400 Units Oral Daily  . zinc sulfate  220 mg Oral Once per day on Mon Thu   Continuous Infusions:  PRN Meds: acetaminophen **OR** acetaminophen, albuterol, bisacodyl, HYDROcodone-acetaminophen, nitroGLYCERIN, ondansetron **OR** ondansetron (ZOFRAN) IV, traZODone   Vital Signs    Vitals:   03/03/18 1959 03/03/18 2220 03/04/18 0400 03/04/18 0741  BP: 135/78  (!) 143/67 134/71  Pulse: (!) 55  (!) 50 (!) 50  Resp: 18  18   Temp: 98.4 F (36.9 C)  97.7 F (36.5 C) 98 F (36.7 C)  TempSrc:    Oral  SpO2: 96% 97% 96% 98%  Weight:   91.7 kg   Height:        Intake/Output Summary (Last 24 hours) at 03/04/2018 0809 Last data filed at 03/04/2018 0000 Gross per 24 hour  Intake -  Output 375 ml  Net -375 ml   Filed Weights   03/02/18 2055 03/04/18 0400  Weight: 91.6 kg 91.7 kg    Physical Exam   GEN: Well nourished, well developed, in no acute distress.  HEENT:  Grossly normal.  Neck: Supple, no JVD, carotid bruits, or masses. Cardiac: RRR, 3/6 SEM throughout, loudest @ upper sternal borders, no rubs, or gallops. No clubbing, cyanosis.  1+ RLE edema.  Radials/DP/PT 1+ and equal bilaterally.  Respiratory:  Respirations regular and unlabored, clear to auscultation bilaterally. GI: Soft, nontender, nondistended, BS + x 4. MS: no deformity or atrophy. Skin: warm and dry, no rash. Neuro:  Strength and sensation are intact. Psych: AAOx3.  Normal affect.  Labs    Chemistry Recent Labs  Lab 03/02/18 2054 03/03/18 0751  NA 139 141  K 4.1 3.9  CL 109 109  CO2 21* 25  GLUCOSE 137* 108*  BUN 29* 24*  CREATININE 1.91* 1.57*  CALCIUM 8.7* 8.7*  GFRNONAA 32* 40*  GFRAA 37* 47*  ANIONGAP 9 7     Hematology Recent Labs  Lab 03/02/18 2054 03/03/18 0751  WBC 6.4 6.8  RBC 3.32* 3.47*  HGB 10.3* 10.7*  HCT 32.1* 33.8*  MCV 96.7 97.4  MCH 31.0 30.8  MCHC 32.1 31.7  RDW 15.8* 15.9*  PLT 195 216  Cardiac Enzymes Recent Labs  Lab 03/02/18 2054  TROPONINI <0.03    BNP Recent Labs  Lab 03/02/18 1936  BNP 380.0*      Radiology    Dg Chest Portable 1 View  Result Date: 03/02/2018 CLINICAL DATA:  Chest pain and dizziness. EXAM: PORTABLE CHEST 1 VIEW COMPARISON:  08/06/2016 FINDINGS: Sternotomy wires unchanged. Lungs are adequately inflated with mild prominence of the perihilar markings likely mild vascular congestion. Possible hazy opacification in the left base. Stable cardiomegaly. Remainder of the exam is unchanged. IMPRESSION: Possible small left effusion/atelectasis versus infection. Cardiomegaly with mild vascular congestion. Electronically Signed   By: Elberta Fortis M.D.   On: 03/02/2018 21:15    Telemetry    Sinus brady, 50's. QTc 490. No prolonged pauses or evidence of high grade heart block. - Personally Reviewed  Cardiac Studies   2D Echocardiogram 10.24.2019  Study Conclusions  - Left ventricle: The cavity size  was mildly dilated. There was   mild concentric hypertrophy. Systolic function was normal. The   estimated ejection fraction was in the range of 55% to 60%. Wall   motion was normal; there were no regional wall motion   abnormalities. Doppler parameters are consistent with abnormal   left ventricular relaxation (grade 1 diastolic dysfunction). - Aortic valve: Trileaflet; severely calcified leaflets. There was   moderate stenosis. Peak velocity (S): 319 cm/s. Mean gradient   (S): 24 mm Hg. Peak gradient (S): 41 mm Hg. Calculated AVA likely   not accurate given suboptimal LVOT velocity measurements. - Mitral valve: There was mild regurgitation. - Left atrium: The atrium was moderately dilated. - Right ventricle: Systolic function was normal. - Pulmonary arteries: Systolic pressure was within the normal   range.  Impressions:  - No significant change compared to prior study in 2018.  Patient Profile      82 y.o. male with a history of CAD s/p CABG and subsequent PCI w/ chronic/stable angina, labile HTN, HL, PAF on amio/eliquis, prior GIB, IDA, GERD, and moderate Ao Stenosis, who was admitted 10/23 after mechanical fall followed by presyncope and bradycardia.  Assessment & Plan    1.  Presyncope/falls:  Presented to ED on 10/23 after mechanical fall (tripped over tree root), following which, he developed presyncope and was later noted by EMS to be bradycardic.  EMS strips reviewed in detail yesterday.  Initial rate in 30's - baseline artifact so not clear if this represented slow afib, vs jxnl, vs sinus brady. Though sinus brady clearly noted following atropine a few mins later.  Initial recorded bp, ~ 10 mins after EMS arrival was 124/57.  Here, QTc was prolonged >580msec and he remained brady in the 40's to 50's.   blocker, which was recently added has been d/c'd and amio dose reduced to 200 daily.  No significant arrhythmias on tele since admission.  No evidence of high grade HB.  QTc 490  this AM.  Pt ambulated yesterday with rise in HR into the 60's.  O2 sats wnl.  He was asymptomatic and has had no recurrent presyncope.  Echo shows stable, mod AS.  Suspect vasovagal episode in setting of baseline bradycardia on  blocker and amio therapy, following mechanical fall, most likely explanation.  Cont reduced dose amio 200 daily.  Do not resume  blocker.  Our office nurse will come over to place zio monitor w/ live telemetry this AM.  Pt has f/u with Dr. Tresa Endo in ~ 1 month.  2.  Prolonged QT:  In  setting of amio 200 bid.  QTc 490 this AM, which is improved.  Cont amio 200 daily.  3.  CAD:  S/p prior CABG and PCI of VG  D2 in 2005.  Last cath in 11/2016 showed stable dzs and he has been medically managed. Trop nl x 1.  Cont statin and nitrate.   blocker d/c'd in setting of bradycardia.  On both ASA and eliquis per Dr. Tresa Endo.  4.  Essential HTN:  Stable over past 24 hrs.  If he spikes again @ home, we could look to titrate hydralazine to 25 TID.  5.  CKD III:  Stable.  6.  HL:  LDL 47 in 06/2016.  Will need outpt f/u.  Cont statin, zetia.  On fish oil, question utility.  7.  PAF:  Maintaining sinus.  Cont low dose amio/eliquis.  8.  IDA:  Stable.  Signed, Nicolasa Ducking, NP  03/04/2018, 8:09 AM    For questions or updates, please contact   Please consult [email or web address] for contact info under Cardiology/STEMI.

## 2018-03-04 NOTE — Discharge Summary (Signed)
[facility] - [place] [facility]  Jorge Cherry, 82 y.o., DOB 1936-02-22, MRN [id number]. Admission date: 03/02/2018 Discharge Date 03/04/2018 Primary MD Chilton Greathouse, MD Admitting Physician Cammy Copa, MD  Admission Diagnosis  Bradycardia [R00.1] Near syncope [R55]  Discharge Diagnosis   Active Problems: Near syncope due to bradycardia Symptomatic bradycardia due to medication Chronic kidney disease stage III stable Coronary artery disease Paroxysmal atrial fibrillation Hypertension Hyperlipidemia Anemia of chronic disease Moderate aortic stenosis  Hospital Course  Lynk Frampton  is a 82 y.o. male with a known history of anemia of chronic diseases, CKD, paroxysmal atrial fibrillation, CAD.Patient was brought to emergency room status post near syncopal episode at home.    When patient was seen by EMS he was noted to be very bradycardic.  Heart rate in the 20s.  He was brought to the hospital.  He was seen by cardiology his metoprolol was held he is QT was prolonged therefore amiodarone dose was changed from twice daily to once a day.  Echo showed no significant changes.  Patient heart rate increases with activity appropriately.  Cardiology recommended cardiac monitoring as outpatient he will follow-up with his primary cardiologist to follow-up on the results of this.           Consults  cardiology  Significant Tests:  See full reports for all details     Dg Chest Portable 1 View  Result Date: 03/02/2018 CLINICAL DATA:  Chest pain and dizziness. EXAM: PORTABLE CHEST 1 VIEW COMPARISON:  08/06/2016 FINDINGS: Sternotomy wires unchanged. Lungs are adequately inflated with mild prominence of the perihilar markings likely mild vascular congestion. Possible hazy opacification in the left base. Stable cardiomegaly. Remainder of the exam is unchanged. IMPRESSION: Possible small left effusion/atelectasis versus infection. Cardiomegaly with mild vascular congestion.  Electronically Signed   By: Elberta Fortis M.D.   On: 03/02/2018 21:15       Today   Subjective:   Jorge Cherry patient doing much better  Objective:   Blood pressure 134/71, pulse (!) 50, temperature 98 F (36.7 C), temperature source Oral, resp. rate 18, height 5\' 8"  (1.727 m), weight 91.7 kg, SpO2 98 %.  .  Intake/Output Summary (Last 24 hours) at 03/04/2018 1609 Last data filed at 03/04/2018 0000 Gross per 24 hour  Intake -  Output 50 ml  Net -50 ml    Exam VITAL SIGNS: Blood pressure 134/71, pulse (!) 50, temperature 98 F (36.7 C), temperature source Oral, resp. rate 18, height 5\' 8"  (1.727 m), weight 91.7 kg, SpO2 98 %.  GENERAL:  82 y.o.-year-old patient lying in the bed with no acute distress.  EYES: Pupils equal, round, reactive to light and accommodation. No scleral icterus. Extraocular muscles intact.  HEENT: Head atraumatic, normocephalic. Oropharynx and nasopharynx clear.  NECK:  Supple, no jugular venous distention. No thyroid enlargement, no tenderness.  LUNGS: Normal breath sounds bilaterally, no wheezing, rales,rhonchi or crepitation. No use of accessory muscles of respiration.  CARDIOVASCULAR: S1, S2 normal. No murmurs, rubs, or gallops.  ABDOMEN: Soft, nontender, nondistended. Bowel sounds present. No organomegaly or mass.  EXTREMITIES: No pedal edema, cyanosis, or clubbing.  NEUROLOGIC: Cranial nerves II through XII are intact. Muscle strength 5/5 in all extremities. Sensation intact. Gait not checked.  PSYCHIATRIC: The patient is alert and oriented x 3.  SKIN: No obvious rash, lesion, or ulcer.   Data Review     CBC w Diff:  Lab Results  Component Value Date   WBC 6.8 03/03/2018  HGB 10.7 (L) 03/03/2018   HGB 10.3 (L) 04/22/2017   HCT 33.8 (L) 03/03/2018   HCT 31.8 (L) 04/22/2017   PLT 216 03/03/2018   PLT 210 04/22/2017   LYMPHOPCT 13 03/02/2018   MONOPCT 17 03/02/2018   EOSPCT 3 03/02/2018   BASOPCT 1 03/02/2018   CMP:  Lab Results   Component Value Date   NA 141 03/03/2018   NA 140 04/13/2017   K 3.9 03/03/2018   CL 109 03/03/2018   CO2 25 03/03/2018   BUN 24 (H) 03/03/2018   BUN 26 04/13/2017   CREATININE 1.57 (H) 03/03/2018   CREATININE 1.65 (H) 07/01/2016   PROT 6.4 02/17/2017   ALBUMIN 4.2 02/17/2017   BILITOT 0.2 02/17/2017   ALKPHOS 36 (L) 02/17/2017   AST 23 02/17/2017   ALT 16 02/17/2017  .  Micro Results No results found for this or any previous visit (from the past 240 hour(s)).      Code Status Orders  (From admission, onward)         Start     Ordered   03/03/18 0150  Full code  Continuous     03/03/18 0150        Code Status History    Date Active Date Inactive Code Status Order ID Comments User Context   11/24/2016 1817 11/25/2016 1725 Full Code [id number]  Lennette Bihari, MD Inpatient   11/20/2015 1407 11/23/2015 1927 Full Code [id number]  Lyn Records, MD ED   01/22/2015 0606 01/22/2015 2104 Full Code [id number]  Alberteen Sam, MD Inpatient   09/24/2014 1845 09/26/2014 1911 Full Code [id number]  Russella Dar, NP Inpatient          Follow-up Information    Chilton Greathouse, MD On 03/11/2018.   Specialty:  Internal Medicine Why:  Appointment Time: @ pt will have to call to make appt Contact information: [address] [place] [address] [phone]        Azalee Course, [place] On 03/11/2018.   Specialties:  Cardiology, Radiology Why:  Appointment Time: @ 8:00 Contact information: [address] [place] [address] [phone]           Discharge Medications   Allergies as of 03/04/2018      Reactions   Altace [ramipril] Other (See Comments)   Mouth swelling   Mucinex [guaifenesin Er] Hives, Other (See Comments)   Mouth swelling   Contrast Media [iodinated Diagnostic Agents] Other (See Comments)   Made eyes change each time      Medication List    STOP taking these medications   Metoprolol Succinate 25 MG Cs24     TAKE these  medications   acetaminophen 500 MG tablet Commonly known as:  TYLENOL Take 1,000 mg by mouth every 8 (eight) hours as needed. For 3-4 days for pain   albuterol 108 (90 Base) MCG/ACT inhaler Commonly known as:  PROVENTIL HFA;VENTOLIN HFA INHALE TWO PUFFS EVERY 4-6 HOURS AS NEEDED FOR COUGH OR WHEEZE   amiodarone 200 MG tablet Commonly known as:  PACERONE Take 1 tablet (200 mg total) by mouth daily. What changed:  when to take this   amLODipine 5 MG tablet Commonly known as:  NORVASC Take 5 mg twice a day   aspirin EC 81 MG tablet Take 1 tablet (81 mg total) by mouth every evening.   atorvastatin 20 MG tablet Commonly known as:  LIPITOR Take 20 mg by mouth every evening.   ELIQUIS 5 MG  Tabs tablet Generic drug:  apixaban TAKE 1 TABLET BY MOUTH TWICE A DAY   ezetimibe 10 MG tablet Commonly known as:  ZETIA TAKE 1 TABLET (10 MG TOTAL) BY MOUTH DAILY.   fenofibrate 145 MG tablet Commonly known as:  TRICOR Take 72.5 mg by mouth daily.   Fish Oil 1000 MG Caps Take 1 capsule by mouth 2 (two) times daily.   hydrALAZINE 25 MG tablet Commonly known as:  APRESOLINE Take 25 mg by mouth 2 (two) times daily.   ipratropium 0.06 % nasal spray Commonly known as:  ATROVENT Can use two sprays in each nostril every six hours as needed to dry up nose.   isosorbide mononitrate 60 MG 24 hr tablet Commonly known as:  IMDUR Take 2 tablets (120 mg total) by mouth every morning AND 0.5 tablets (30 mg total) every evening.   loratadine 10 MG tablet Commonly known as:  CLARITIN Take 10 mg by mouth every evening.   MICARDIS 80 MG tablet Generic drug:  telmisartan Take 1/2 tablet daily   nitroGLYCERIN 0.4 MG/SPRAY spray Commonly known as:  NITROLINGUAL PLACE 1 SPRAY UNDER THE TONGUE EVERY 5 (FIVE) MINUTES X 3 DOSES AS NEEDED FOR CHEST PAIN.   pantoprazole 40 MG tablet Commonly known as:  PROTONIX Take 1 tablet (40 mg total) by mouth 2 (two) times daily. What changed:  when to take  this   ranolazine 500 MG 12 hr tablet Commonly known as:  RANEXA Take 250 mg by mouth 2 (two) times daily.   torsemide 20 MG tablet Commonly known as:  DEMADEX Take 1/2 tablet daily   vitamin C 500 MG tablet Commonly known as:  ASCORBIC ACID Take 500 mg by mouth daily.   VITAMIN D-3 PO Take 1,000 Units by mouth every evening.   vitamin E 400 UNIT capsule Take 400 Units by mouth daily.   zinc gluconate 50 MG tablet Take 50 mg by mouth 2 (two) times a week.          Total Time in preparing paper work, data evaluation and todays exam - 35 minutes  Auburn Bilberry M.D on 03/04/2018 at 4:09 PM [facility]   Office  [phone]

## 2018-03-07 ENCOUNTER — Telehealth: Payer: Self-pay | Admitting: *Deleted

## 2018-03-07 NOTE — Telephone Encounter (Signed)
Incoming call from [facility] monitors. Jorge Cherry was calling to give the initial report. The patient is in atrial fibrillation with an average heart rate of 63-94 bpm.   The patient has a history of afib and is currently on eliquis 5 mg bid and amiodarone 200 mg daily.  Call placed to the patient to verify that he is taking his Eliquis. He stated that he was at a reunion yesterday. He stated that he had a busy day yesterday and knew he was in atrial fibrillation. He is asymptomatic now and feels better. He has been advised to call if he needs anything further.  Patient has a follow up on 03/11/18.

## 2018-03-11 ENCOUNTER — Encounter: Payer: Self-pay | Admitting: Physician Assistant

## 2018-03-11 ENCOUNTER — Ambulatory Visit: Payer: Medicare Other | Admitting: Physician Assistant

## 2018-03-11 VITALS — BP 130/62 | HR 76 | Ht 68.0 in | Wt 204.2 lb

## 2018-03-11 DIAGNOSIS — I25708 Atherosclerosis of coronary artery bypass graft(s), unspecified, with other forms of angina pectoris: Secondary | ICD-10-CM | POA: Diagnosis not present

## 2018-03-11 DIAGNOSIS — N183 Chronic kidney disease, stage 3 unspecified: Secondary | ICD-10-CM

## 2018-03-11 DIAGNOSIS — I480 Paroxysmal atrial fibrillation: Secondary | ICD-10-CM

## 2018-03-11 DIAGNOSIS — D509 Iron deficiency anemia, unspecified: Secondary | ICD-10-CM

## 2018-03-11 DIAGNOSIS — I10 Essential (primary) hypertension: Secondary | ICD-10-CM

## 2018-03-11 DIAGNOSIS — E785 Hyperlipidemia, unspecified: Secondary | ICD-10-CM

## 2018-03-11 DIAGNOSIS — R42 Dizziness and giddiness: Secondary | ICD-10-CM | POA: Diagnosis not present

## 2018-03-11 NOTE — Patient Instructions (Signed)
Medication Instructions:   Take Amlodipine in the evening.   If you need a refill on your cardiac medications before your next appointment, please call your pharmacy.   Lab work: none   Testing/Procedures: None  Any Other Special Instructions Will Be Listed Below (If Applicable). Your physician has requested that you regularly monitor and record your blood pressure readings twice a day at home. Please use the same machine at the same time of day to check your readings and record them to bring to your follow-up visit.  If your systolic blood pressure (top number) is consistently under 110, stop Hydralazine.   Follow-Up: At [facility], you and your health needs are our priority.  As part of our continuing mission to provide you with exceptional heart care, we have created designated Provider Care Teams.  These Care Teams include your primary Cardiologist (physician) and Advanced Practice Providers (APPs -  Physician Assistants and Nurse Practitioners) who all work together to provide you with the care you need, when you need it.  Please keep your scheduled follow-up appointment with Dr. Tresa Endo.  Advanced Practice Providers on your designated Care Team: [place], [place] . Micah Flesher, PA-C

## 2018-03-11 NOTE — Progress Notes (Addendum)
Cardiology Office Note    Date:  03/11/2018   ID:  Jorge Cherry, DOB 1936/01/14, MRN [id number]  PCP:  Chilton Greathouse, MD  Cardiologist:  Dr. Tresa Endo   Chief Complaint  Patient presents with  . Follow-up    seen for Dr. Tresa Endo.     History of Present Illness:  Jorge Cherry is a 82 y.o. male with PMH of CAD s/p CABG 1994 (LIMA-LAD, SVG-D2, SVG-RCA), CKD stage III, HLD, HTN, aortic stenosis and PAF.  In 2005, he underwent DES to proximal SVG to diagonal.  Last Myoview was performed on 01/22/2015 which showed EF 54%, small medium intensity reversible defect in the distal inferior lateral wall and apex consistent with mild ischemia.  Cardiac catheterization performed on 11/24/2016 showed occluded SVG to distal RCA, patent SVG to second diagonal with patent proximal stent, patent SVG to distal marginal vessel with retrograde filling of left circumflex artery, patent LIMA to mid LAD.  Total occlusion of native LAD after the septal perforator artery.  The proximal LAD and the diagonal vessel was not supplied by the graft.  95% proximal LAD stenosis prior to the first diagonal.  EF was 55%.  Intervention to the proximal LAD territory was deferred, medical therapy was recommended.  He had a history of GI bleed and has been on and off antiplatelet and oral anticoagulation therapy.  He also has a history of iron deficiency anemia.  More recently, he has been on Eliquis for paroxysmal atrial fibrillation.  Patient was most recently admitted at [facility] on 03/02/2018.  Apparently he was walking outside of his daughter's apartment in the dark and tripped over a tree root.  He fell forward and landed flat on his chest.  He did not lose consciousness.  After he sat up, he began to have dizziness.  He said there for a minute before stood up and start walking again.  Once he was back inside, his family noticed he was very pale, clammy, diaphoretic and very lightheaded.  He denied any chest pain.  EMS was called and his  heart rate was in the 30s along with prolonged QTC. He was given atropine.  Subsequent blood pressure was normal.  His amiodarone was reduced and beta-blocker was held since the strip showed possible junctional rhythm versus marked bradycardia. Zio Patch monitor was recommended.  Echocardiogram obtained on 03/03/2018 showed EF 55 to 60%, grade 1 DD, moderate aortic stenosis, mild MR.  Patient presents today for cardiology office visit.  Since discharge, he has been doing quite well.  He did have an episode of angina 2 days ago, this is chronic for him.  It appears he has angina with more extreme activity but not with every day activity.  He also noticed some dizziness immediately after he take his blood pressure medication.  His blood pressure is 130/62 in the office today, he has not taken any blood pressure medication this morning.  He is currently on for blood pressure medication I am concerned that his blood pressure is dropping too much at home.  I asked him to keep a blood pressure diary twice a day.  If his systolic blood pressure is consistently lower than 110, I would recommend stop the hydralazine.  I also recommended for him to move the amlodipine to nighttime.  On physical exam, his lungs clear, he has 2+ pitting edema in the right lower extremity, but no edema in the left lower extremity.  This is chronic for him as well after  vein harvesting prior to the bypass surgery on the right side.  Overall, he appears to be euvolemic.  He is wearing the ZioPatch.  He has received some phone call from the monitor company and I informed him he has went back in and out of atrial fibrillation since discharge.  His daughter who has a apple watch obtained a strip that shows he is back in sinus rhythm.  Past Medical History:  Diagnosis Date  . Arthritis    "just a touch in my hands" (11/24/2016)  . Atherosclerosis of renal artery (HCC)    RENAL DOPPLER, 12/10/2011 - Left renal artery demonstrated narrowing with  elevated velocities consistent with a 1-59% diameter reduction  . CKD (chronic kidney disease) stage 3, GFR 30-59 ml/min (HCC)    "stable now since they backed off the water pills" (11/24/2016)  . Coronary artery disease    a. 1994 s/p CABG x 4 (LIMA-LAD, VG->D2, VG->OM, VG->RCA); b. 02/2004 PCI SVG-D2 (3.5x16 Taxus DES). VG->RCA 100. Sev apical LAD dzs distal to LIMA insertion; c. 11/2016 Cath: LAD 95p/181m, D2 100ost, LCX 100ost, 70p/m, RCA 100p/m, RPDA fills via L->R collats. VG->RCA 100, VG->D2 20 ost, patent prox stent, 25m, LIMA->LAD ok, VG->OM3 20p. EF 55%-->Med Rx.  . GERD (gastroesophageal reflux disease)   . High cholesterol   . History of lower GI bleeding    a. 09/2014 GIB due to diverticulosis/diverticulitis.  . Iron deficiency anemia   . Labile Hypertension   . Moderate aortic stenosis    a. 10/2011 Echo:  EF >55%, mild-mod TR, mild-mod AS, mod Ca2+ of AoV leaflets; b. 07/2016 Echo: EF 60-65%, no rwma, Gr1 DD, mod AS [(S) mean grad , peak grad . Valve area (VTI): 1.33cm^2, (Vmax) 1.44cm^2. Mild MR]; c. 02/2018 Echo: EF 55-60%, no rwma, GR1 DD, Mod AS [Peak Vel (S): 319cm/s, Mean grad (S) , peak grad (S) 67mmHg].  [name] PAF (paroxysmal atrial fibrillation) (HCC)    a. CHA2DS2VASc = 4-->Eliquis.    Past Surgical History:  Procedure Laterality Date  . CARDIAC CATHETERIZATION  02/27/2004   Coronary intervention and medical management  . CARDIAC CATHETERIZATION  11/24/2016  . CATARACT EXTRACTION W/ INTRAOCULAR LENS IMPLANT Left   . CORONARY ANGIOPLASTY  1994 X 2   "before bypass surgery"  . CORONARY ANGIOPLASTY WITH STENT PLACEMENT  03/04/2004   SVG supplying the diagonal vessel stented with a 3.5x69mm Taxus stent post dilated to 4.0 mm  . CORONARY ARTERY BYPASS GRAFT  1994   "CABG X4"  . INGUINAL HERNIA REPAIR Right   . RIGHT HEART CATH AND CORONARY/GRAFT ANGIOGRAPHY N/A 11/24/2016   Procedure: Right Heart Cath and Coronary/Graft Angiography;  Surgeon: Lennette Bihari, MD;  Location: [facility] INVASIVE CV LAB;  Service: Cardiovascular;  Laterality: N/A;  . TONSILLECTOMY AND ADENOIDECTOMY      Current Medications: Outpatient Medications Prior to Visit  Medication Sig Dispense Refill  . acetaminophen (TYLENOL) 500 MG tablet Take 1,000 mg by mouth every 8 (eight) hours as needed. For 3-4 days for pain    . albuterol (PROAIR HFA) 108 (90 Base) MCG/ACT inhaler INHALE TWO PUFFS EVERY 4-6 HOURS AS NEEDED FOR COUGH OR WHEEZE 1 Inhaler 1  . amiodarone (PACERONE) 200 MG tablet Take 1 tablet (200 mg total) by mouth daily. 180 tablet 3  . amLODipine (NORVASC) 5 MG tablet Take 5 mg twice a day 60 tablet 6  . aspirin EC 81 MG tablet Take 1 tablet (81 mg total) by mouth every evening. 30 tablet 0  .  atorvastatin (LIPITOR) 20 MG tablet Take 20 mg by mouth every evening.     . Cholecalciferol (VITAMIN D-3 PO) Take 1,000 Units by mouth every evening.     [name] ELIQUIS 5 MG TABS tablet TAKE 1 TABLET BY MOUTH TWICE A DAY 180 tablet 1  . ezetimibe (ZETIA) 10 MG tablet TAKE 1 TABLET (10 MG TOTAL) BY MOUTH DAILY. 90 tablet 2  . fenofibrate (TRICOR) 145 MG tablet Take 72.5 mg by mouth daily.     . hydrALAZINE (APRESOLINE) 25 MG tablet Take 25 mg by mouth 2 (two) times daily.    [name] ipratropium (ATROVENT) 0.06 % nasal spray Can use two sprays in each nostril every six hours as needed to dry up nose. 15 mL 5  . isosorbide mononitrate (IMDUR) 60 MG 24 hr tablet Take 2 tablets (120 mg total) by mouth every morning AND 0.5 tablets (30 mg total) every evening. 225 tablet 3  . loratadine (CLARITIN) 10 MG tablet Take 10 mg by mouth every evening.     . nitroGLYCERIN (NITROLINGUAL) 0.4 MG/SPRAY spray PLACE 1 SPRAY UNDER THE TONGUE EVERY 5 (FIVE) MINUTES X 3 DOSES AS NEEDED FOR CHEST PAIN. 1 g 2  . Omega-3 Fatty Acids (FISH OIL) 1000 MG CAPS Take 1 capsule by mouth 2 (two) times daily.     . pantoprazole (PROTONIX) 40 MG tablet Take 1 tablet (40 mg total) by mouth 2 (two) times daily. (Patient taking  differently: Take 40 mg by mouth daily. ) 60 tablet 5  . ranolazine (RANEXA) 500 MG 12 hr tablet Take 250 mg by mouth 2 (two) times daily.     [name] telmisartan (MICARDIS) 80 MG tablet Take 1/2 tablet daily 30 tablet 6  . torsemide (DEMADEX) 20 MG tablet Take 1/2 tablet daily 180 tablet 3  . vitamin C (ASCORBIC ACID) 500 MG tablet Take 500 mg by mouth daily.    . vitamin E 400 UNIT capsule Take 400 Units by mouth daily.    [name] zinc gluconate 50 MG tablet Take 50 mg by mouth 2 (two) times a week.     No facility-administered medications prior to visit.      Allergies:   Altace [ramipril]; Mucinex [guaifenesin er]; and Contrast media [iodinated diagnostic agents]   Social History   Socioeconomic History  . Marital status: Married    Spouse name: Not on file  . Number of children: 2  . Years of education: Not on file  . Highest education level: Not on file  Occupational History  . Not on file  Social Needs  . Financial resource strain: Not on file  . Food insecurity:    Worry: Not on file    Inability: Not on file  . Transportation needs:    Medical: Not on file    Non-medical: Not on file  Tobacco Use  . Smoking status: Never Smoker  . Smokeless tobacco: Never Used  Substance and Sexual Activity  . Alcohol use: No    Alcohol/week: 0.0 standard drinks  . Drug use: No  . Sexual activity: Not Currently  Lifestyle  . Physical activity:    Days per week: Not on file    Minutes per session: Not on file  . Stress: Not on file  Relationships  . Social connections:    Talks on phone: Not on file    Gets together: Not on file    Attends religious service: Not on file    Active member of club or organization: Not  on file    Attends meetings of clubs or organizations: Not on file    Relationship status: Not on file  Other Topics Concern  . Not on file  Social History Narrative   Lives at home in [place] with wife.  Retired from [employer].       Family History:   The patient's family history includes Cancer in his maternal grandfather and mother; Heart disease in his father; Stroke in his maternal grandmother.   ROS:   Please see the history of present illness.    ROS All other systems reviewed and are negative.   PHYSICAL EXAM:   VS:  BP 130/62   Pulse 76   Ht 5\' 8"  (1.727 m)   Wt 204 lb 3.2 oz (92.6 kg)   SpO2 98%   BMI 31.05 kg/m    GEN: Well nourished, well developed, in no acute distress  HEENT: normal  Neck: no JVD, carotid bruits, or masses Cardiac: RRR; no rubs, or gallops. RLE 2+ edema, no edema in LLE. 3/6 systolic murmur at RUSB Respiratory:  clear to auscultation bilaterally, normal work of breathing GI: soft, nontender, nondistended, + BS MS: no deformity or atrophy  Skin: warm and dry, no rash Neuro:  Alert and Oriented x 3, Strength and sensation are intact Psych: euthymic mood, full affect  Wt Readings from Last 3 Encounters:  03/11/18 204 lb 3.2 oz (92.6 kg)  03/04/18 202 lb 2.6 oz (91.7 kg)  02/10/18 205 lb (93 kg)      Studies/Labs Reviewed:   EKG:  EKG is not ordered today.    Recent Labs: 03/02/2018: B Natriuretic Peptide 380.0 03/03/2018: BUN 24; Creatinine, Ser 1.57; Hemoglobin 10.7; Platelets 216; Potassium 3.9; Sodium 141   Lipid Panel    Component Value Date/Time   CHOL 106 07/01/2016 1345   CHOL 137 04/09/2014 1115   TRIG 120 07/01/2016 1345   TRIG 182 (H) 04/09/2014 1115   HDL 35 (L) 07/01/2016 1345   HDL 32 (L) 04/09/2014 1115   CHOLHDL 3.0 07/01/2016 1345   VLDL 24 07/01/2016 1345   LDLCALC 47 07/01/2016 1345   LDLCALC 69 04/09/2014 1115    Additional studies/ records that were reviewed today include:   Myoview 01/22/2015  There was no ST segment deviation noted during stress.  This is a low risk study.  Findings consistent with ischemia.  The left ventricular ejection fraction is mildly decreased (45-54%).  Nuclear stress EF: 54%.   Low risk stress nuclear study with a small,  medium intensity, reversible defect in the distal inferior lateral wall and apex; findings consistent with mild ischemia; EF 54; mild LVE.   Cath 11/24/2016  Prox RCA to Mid RCA lesion, 100 %stenosed.  SVG.  Origin to Insertion lesion, 100 %stenosed.  Prox LAD lesion, 95 %stenosed.  Mid LAD lesion, 100 %stenosed.  SVG.  Origin to Prox Graft lesion, 5 %stenosed.  Mid Graft lesion, 40 %stenosed.  Ost 2nd Diag lesion, 100 %stenosed.  LIMA.  Ost Cx to Prox Cx lesion, 100 %stenosed.  SVG.  Prox Graft lesion, 20 %stenosed.  Hemodynamic findings consistent with mild pulmonary hypertension.  Prox Cx to Mid Cx lesion, 70 %stenosed.  2nd Diag lesion, 30 %stenosed.  Origin lesion, 20 %stenosed.   Preserved global LV function with focal mild mid anterolateral hypocontractility and an ejection fraction of 55%.  Supravalvular aortography demonstrating upper normal aortic root with mild aortic valve calcification with reduced moderately reduced excursion.  No angiographic  aortic insufficiency.  Severe native CAD with 95% proximal LAD stenosis just prior to the first diagonal vessel with total occlusion of the LAD after the third septal perforating artery and before the takeoff of the second diagonal branch; total occlusion of the circumflex at the ostium; and total proximal RCA occlusion.  Patent LIMA graft which supplies the mid LAD, but due to the total occlusion of the LAD after the septal perforating artery, the proximal LAD and diagonal vessel is not supplied by the graft.  Patent vein graft supplying the distal marginal vessel with filling retrograde of the circumflex up to the ostium with 70% mid AV groove stenosis.  There is collateral filling to the distal RCA.  Patent vein graft supplying the second diagonal vessel with 20% ostial narrowing, a patent proximal stent, and diffuse 40% mid graft stenosis.  There is 30% narrowing after the graft anastomosis.  Occluded  SVG which had supplied the distal RCA.  RECOMMENDATION: Mr. Welz has developed increasing symptoms of exertional angina leading to his cardiac catheterization.  The present study only demonstrates mild AS.  Of note, admission laboratory today revealed a significant hemoglobin drop compared to 3 months ago and it is possible that his anemia with hemoglobin of 8 was contributing to his increasing anginal symptomatology.  He has a region of potential proximal LAD ischemia in the territory of the diagonal-1  Vessel due to the 95% stenosis proximally and this segment of the LAD is not supplied by the LIMA graft since the mid LAD os occluded before the graft anastomosis.  His eliquis has been on hold for several days.  Plan to increase medical therapy.  Hemoglobin/hematocrit and iron studies will be rechecked in a.m. and if further drop Dr. Madilyn Fireman will need to be contacted for follow-up GI evaluation.  (He had seen him last week).  At present, will defer intervention to the proximal LAD territory.  At present he is not a candidate for triple drug therapy.  He is maintaining sinus rhythm and eliquis may need to be deferred until  GI evaluation is complete.   Echo 03/03/2018 LV EF: 55% -   60% Study Conclusions  - Left ventricle: The cavity size was mildly dilated. There was   mild concentric hypertrophy. Systolic function was normal. The   estimated ejection fraction was in the range of 55% to 60%. Wall   motion was normal; there were no regional wall motion   abnormalities. Doppler parameters are consistent with abnormal   left ventricular relaxation (grade 1 diastolic dysfunction). - Aortic valve: Trileaflet; severely calcified leaflets. There was   moderate stenosis. Peak velocity (S): 319 cm/s. Mean gradient   (S): 24 mm Hg. Peak gradient (S): 41 mm Hg. Calculated AVA likely   not accurate given suboptimal LVOT velocity measurements. - Mitral valve: There was mild regurgitation. - Left atrium:  The atrium was moderately dilated. - Right ventricle: Systolic function was normal. - Pulmonary arteries: Systolic pressure was within the normal   range.  Impressions:  - No significant change compared to prior study in 2018.   ASSESSMENT:    1. Dizziness   2. Coronary artery disease of bypass graft of native heart with stable angina pectoris (HCC)   3. CKD (chronic kidney disease), stage III (HCC)   4. Essential hypertension   5. Hyperlipidemia, unspecified hyperlipidemia type   6. PAF (paroxysmal atrial fibrillation) (HCC)   7. Iron deficiency anemia, unspecified iron deficiency anemia type      PLAN:  In order of problems listed above:  1. Dizziness: I think his dizziness is related to medication.  Even prior to taking any blood pressure this morning, his blood pressure is 130/62.  He offered to have dizziness immediately after he take his blood pressure medication.  I recommended move his amlodipine to daily at nighttime.  He will continue to check blood pressure twice a day and to keep a blood pressure diary.  If systolic click blood pressure consistently below 110, I recommend him to stop the hydralazine.  He is wearing ZioPatch, he will finish the 14-day monitor on 11/8, we will follow-up on the result with Dr. Tresa Endo.  Severe bradycardia has resolved  2. CAD s/p CABG: He has chronic angina, this only occur with more extreme activities but not with every day activity  3. Hypertension: Blood pressure is actually normal before taking any blood pressure medication.  4. Hyperlipidemia: On Lipitor 20 mg daily  5. PAF: They have been informed by the [employer] company that he has been going in and out of atrial fibrillation after his beta-blocker was stopped and amiodarone was cut back down to 200 mg daily.  We will follow-up on the results of the monitor before making further medication adjustment  6. CKD stage III: Stable on last lab work  7. Anemia: Also stable based on lab  work obtained during the admission.  Continue Eliquis.    Medication Adjustments/Labs and Tests Ordered: Current medicines are reviewed at length with the patient today.  Concerns regarding medicines are outlined above.  Medication changes, Labs and Tests ordered today are listed in the Patient Instructions below. Patient Instructions  Medication Instructions:   Take Amlodipine in the evening.   If you need a refill on your cardiac medications before your next appointment, please call your pharmacy.   Lab work: none   Testing/Procedures: None  Any Other Special Instructions Will Be Listed Below (If Applicable). Your physician has requested that you regularly monitor and record your blood pressure readings twice a day at home. Please use the same machine at the same time of day to check your readings and record them to bring to your follow-up visit.  If your systolic blood pressure (top number) is consistently under 110, stop Hydralazine.   Follow-Up: At [facility], you and your health needs are our priority.  As part of our continuing mission to provide you with exceptional heart care, we have created designated Provider Care Teams.  These Care Teams include your primary Cardiologist (physician) and Advanced Practice Providers (APPs -  Physician Assistants and Nurse Practitioners) who all work together to provide you with the care you need, when you need it.  Please keep your scheduled follow-up appointment with Dr. Tresa Endo.  Advanced Practice Providers on your designated Care Team: [place], [place] . Micah Flesher, PA-C        Signed, [place], [place]  03/11/2018 9:18 AM    [facility] [address] [place], [place], [place]  [address] Phone: [phone]; Fax: [phone]

## 2018-03-12 ENCOUNTER — Telehealth: Payer: Self-pay | Admitting: Physician Assistant

## 2018-03-12 ENCOUNTER — Inpatient Hospital Stay (HOSPITAL_COMMUNITY)
Admission: EM | Admit: 2018-03-12 | Discharge: 2018-03-16 | DRG: 377 | Disposition: A | Discharge status: Home / Self Care | Payer: Medicare Other | Attending: Internal Medicine | Admitting: Internal Medicine

## 2018-03-12 ENCOUNTER — Encounter (HOSPITAL_COMMUNITY): Payer: Self-pay | Admitting: Emergency Medicine

## 2018-03-12 ENCOUNTER — Emergency Department (HOSPITAL_COMMUNITY): Payer: Medicare Other

## 2018-03-12 DIAGNOSIS — R001 Bradycardia, unspecified: Secondary | ICD-10-CM | POA: Diagnosis present

## 2018-03-12 DIAGNOSIS — D62 Acute posthemorrhagic anemia: Secondary | ICD-10-CM | POA: Diagnosis present

## 2018-03-12 DIAGNOSIS — I350 Nonrheumatic aortic (valve) stenosis: Secondary | ICD-10-CM | POA: Diagnosis present

## 2018-03-12 DIAGNOSIS — K922 Gastrointestinal hemorrhage, unspecified: Secondary | ICD-10-CM

## 2018-03-12 DIAGNOSIS — I5032 Chronic diastolic (congestive) heart failure: Secondary | ICD-10-CM | POA: Diagnosis present

## 2018-03-12 DIAGNOSIS — K5731 Diverticulosis of large intestine without perforation or abscess with bleeding: Secondary | ICD-10-CM | POA: Diagnosis not present

## 2018-03-12 DIAGNOSIS — I25118 Atherosclerotic heart disease of native coronary artery with other forms of angina pectoris: Secondary | ICD-10-CM | POA: Diagnosis present

## 2018-03-12 DIAGNOSIS — D649 Anemia, unspecified: Secondary | ICD-10-CM

## 2018-03-12 DIAGNOSIS — Z955 Presence of coronary angioplasty implant and graft: Secondary | ICD-10-CM

## 2018-03-12 DIAGNOSIS — Z951 Presence of aortocoronary bypass graft: Secondary | ICD-10-CM

## 2018-03-12 DIAGNOSIS — K649 Unspecified hemorrhoids: Secondary | ICD-10-CM | POA: Diagnosis present

## 2018-03-12 DIAGNOSIS — Z9989 Dependence on other enabling machines and devices: Secondary | ICD-10-CM

## 2018-03-12 DIAGNOSIS — I25119 Atherosclerotic heart disease of native coronary artery with unspecified angina pectoris: Secondary | ICD-10-CM | POA: Diagnosis present

## 2018-03-12 DIAGNOSIS — J4530 Mild persistent asthma, uncomplicated: Secondary | ICD-10-CM | POA: Diagnosis present

## 2018-03-12 DIAGNOSIS — I2510 Atherosclerotic heart disease of native coronary artery without angina pectoris: Secondary | ICD-10-CM | POA: Diagnosis present

## 2018-03-12 DIAGNOSIS — I21A1 Myocardial infarction type 2: Secondary | ICD-10-CM | POA: Diagnosis not present

## 2018-03-12 DIAGNOSIS — I452 Bifascicular block: Secondary | ICD-10-CM | POA: Diagnosis present

## 2018-03-12 DIAGNOSIS — I480 Paroxysmal atrial fibrillation: Secondary | ICD-10-CM | POA: Diagnosis present

## 2018-03-12 DIAGNOSIS — I4510 Unspecified right bundle-branch block: Secondary | ICD-10-CM | POA: Diagnosis present

## 2018-03-12 DIAGNOSIS — Z7901 Long term (current) use of anticoagulants: Secondary | ICD-10-CM

## 2018-03-12 DIAGNOSIS — N183 Chronic kidney disease, stage 3 unspecified: Secondary | ICD-10-CM | POA: Diagnosis present

## 2018-03-12 DIAGNOSIS — E785 Hyperlipidemia, unspecified: Secondary | ICD-10-CM | POA: Diagnosis present

## 2018-03-12 DIAGNOSIS — R42 Dizziness and giddiness: Secondary | ICD-10-CM | POA: Diagnosis present

## 2018-03-12 DIAGNOSIS — Z888 Allergy status to other drugs, medicaments and biological substances status: Secondary | ICD-10-CM

## 2018-03-12 DIAGNOSIS — I130 Hypertensive heart and chronic kidney disease with heart failure and stage 1 through stage 4 chronic kidney disease, or unspecified chronic kidney disease: Secondary | ICD-10-CM | POA: Diagnosis present

## 2018-03-12 DIAGNOSIS — Z91041 Radiographic dye allergy status: Secondary | ICD-10-CM

## 2018-03-12 DIAGNOSIS — N179 Acute kidney failure, unspecified: Secondary | ICD-10-CM | POA: Diagnosis present

## 2018-03-12 DIAGNOSIS — Z79899 Other long term (current) drug therapy: Secondary | ICD-10-CM

## 2018-03-12 DIAGNOSIS — I10 Essential (primary) hypertension: Secondary | ICD-10-CM | POA: Diagnosis present

## 2018-03-12 DIAGNOSIS — G4733 Obstructive sleep apnea (adult) (pediatric): Secondary | ICD-10-CM | POA: Diagnosis present

## 2018-03-12 DIAGNOSIS — N2889 Other specified disorders of kidney and ureter: Secondary | ICD-10-CM | POA: Diagnosis present

## 2018-03-12 DIAGNOSIS — K5900 Constipation, unspecified: Secondary | ICD-10-CM | POA: Diagnosis present

## 2018-03-12 DIAGNOSIS — E782 Mixed hyperlipidemia: Secondary | ICD-10-CM | POA: Diagnosis present

## 2018-03-12 DIAGNOSIS — K219 Gastro-esophageal reflux disease without esophagitis: Secondary | ICD-10-CM | POA: Diagnosis present

## 2018-03-12 LAB — CBC
HCT: 22.3 % — ABNORMAL LOW (ref 39.0–52.0)
Hemoglobin: 6.6 g/dL — CL (ref 13.0–17.0)
MCH: 30 pg (ref 26.0–34.0)
MCHC: 29.6 g/dL — ABNORMAL LOW (ref 30.0–36.0)
MCV: 101.4 fL — AB (ref 80.0–100.0)
NRBC: 0 % (ref 0.0–0.2)
PLATELETS: 260 10*3/uL (ref 150–400)
RBC: 2.2 MIL/uL — ABNORMAL LOW (ref 4.22–5.81)
RDW: 15.7 % — AB (ref 11.5–15.5)
WBC: 6.4 10*3/uL (ref 4.0–10.5)

## 2018-03-12 LAB — BASIC METABOLIC PANEL
Anion gap: 3 — ABNORMAL LOW (ref 5–15)
BUN: 41 mg/dL — ABNORMAL HIGH (ref 8–23)
CHLORIDE: 116 mmol/L — AB (ref 98–111)
CO2: 23 mmol/L (ref 22–32)
CREATININE: 2.18 mg/dL — AB (ref 0.61–1.24)
Calcium: 8.7 mg/dL — ABNORMAL LOW (ref 8.9–10.3)
GFR calc non Af Amer: 27 mL/min — ABNORMAL LOW (ref >60)
GFR, EST AFRICAN AMERICAN: 31 mL/min — AB (ref >60)
Glucose, Bld: 136 mg/dL — ABNORMAL HIGH (ref 70–99)
Potassium: 3.7 mmol/L (ref 3.5–5.1)
Sodium: 142 mmol/L (ref 135–145)

## 2018-03-12 LAB — I-STAT TROPONIN, ED: Troponin i, poc: 0.08 ng/mL (ref 0.00–0.08)

## 2018-03-12 LAB — PREPARE RBC (CROSSMATCH)

## 2018-03-12 IMAGING — DX DG CHEST 2V
2 series · 2 of 2 positions shown · non-contrast
Comparison: Chest x-ray dated [DATE].

CLINICAL DATA: Chest pain.

EXAM:
CHEST - 2 VIEW

[chest lat]
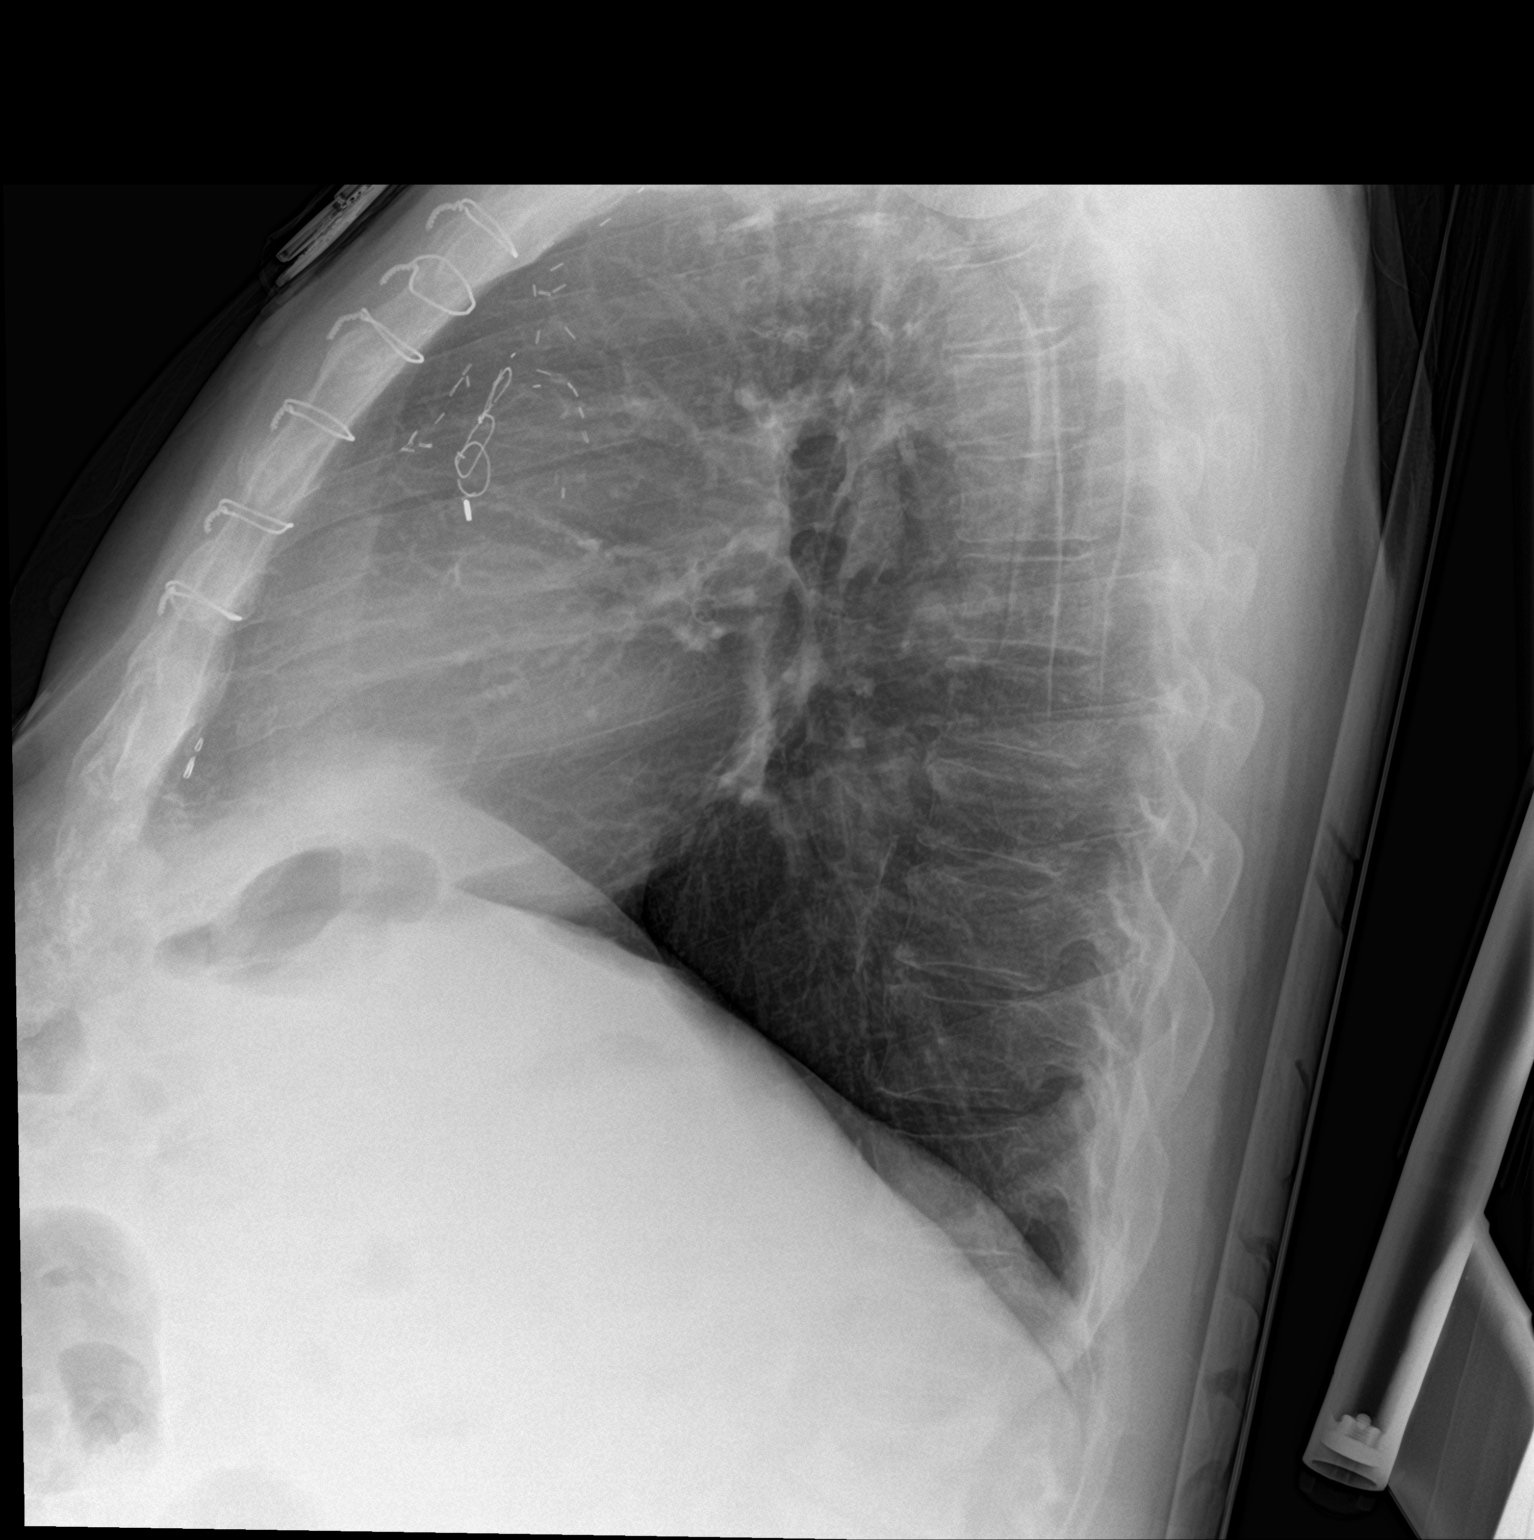

[chest ap]
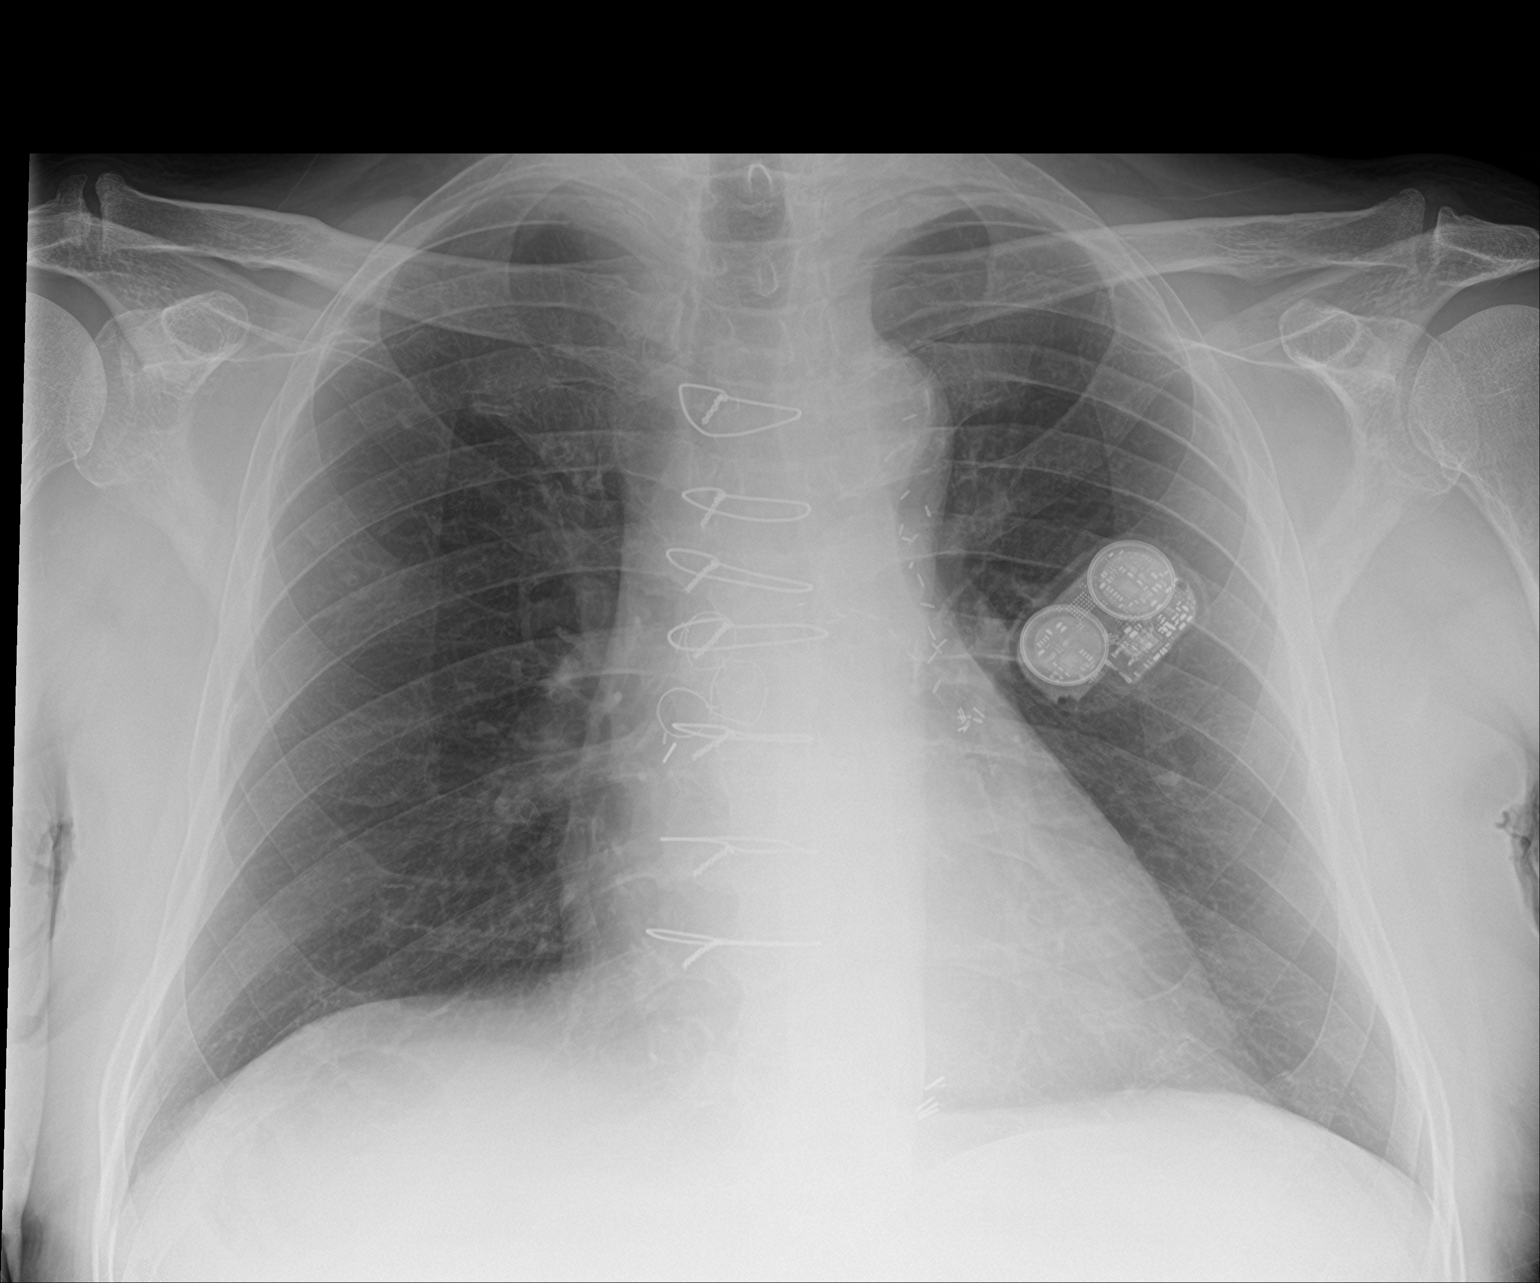

[2 of 2 positions shown; findings below may reference images not displayed]

FINDINGS: Stable cardiomegaly status post CABG. Normal pulmonary vascularity.
Atherosclerotic calcification of the aortic arch. No focal
consolidation, pleural effusion, or pneumothorax. No acute osseous
abnormality. External heart monitor noted.
IMPRESSION: No active cardiopulmonary disease.  Stable cardiomegaly.

## 2018-03-12 MED ORDER — ISOSORBIDE MONONITRATE ER 30 MG PO TB24
120.0000 mg | ORAL_TABLET | Freq: Every day | ORAL | Status: DC
  Administered 2018-03-13 – 2018-03-16 (×4): 120 mg via ORAL
  Filled 2018-03-12 (×4): qty 4

## 2018-03-12 MED ORDER — AMIODARONE HCL 200 MG PO TABS
200.0000 mg | ORAL_TABLET | Freq: Every day | ORAL | Status: DC
  Administered 2018-03-12 – 2018-03-15 (×4): 200 mg via ORAL
  Filled 2018-03-12 (×4): qty 1

## 2018-03-12 MED ORDER — SODIUM CHLORIDE 0.9 % IV SOLN
10.0000 mL/h | Freq: Once | INTRAVENOUS | Status: AC
  Administered 2018-03-14: 10 mL/h via INTRAVENOUS

## 2018-03-12 MED ORDER — ACETAMINOPHEN 325 MG PO TABS: 650.0000 mg | ORAL_TABLET | Freq: Four times a day (QID) | ORAL | Status: DC | PRN

## 2018-03-12 MED ORDER — AMLODIPINE BESYLATE 5 MG PO TABS
5.0000 mg | ORAL_TABLET | Freq: Every day | ORAL | Status: DC
  Administered 2018-03-12 – 2018-03-15 (×4): 5 mg via ORAL
  Filled 2018-03-12 (×4): qty 1

## 2018-03-12 MED ORDER — FLUTICASONE PROPIONATE HFA 110 MCG/ACT IN AERO: 1.0000 | INHALATION_SPRAY | Freq: Three times a day (TID) | RESPIRATORY_TRACT | Status: DC | PRN

## 2018-03-12 MED ORDER — EZETIMIBE 10 MG PO TABS
10.0000 mg | ORAL_TABLET | Freq: Every day | ORAL | Status: DC
  Administered 2018-03-13 – 2018-03-16 (×4): 10 mg via ORAL
  Filled 2018-03-12 (×4): qty 1

## 2018-03-12 MED ORDER — NITROGLYCERIN 0.4 MG SL SUBL: 0.4000 mg | SUBLINGUAL_TABLET | SUBLINGUAL | Status: DC | PRN

## 2018-03-12 MED ORDER — RANOLAZINE ER 500 MG PO TB12
500.0000 mg | ORAL_TABLET | Freq: Two times a day (BID) | ORAL | Status: DC
  Administered 2018-03-12 – 2018-03-16 (×8): 500 mg via ORAL
  Filled 2018-03-12 (×8): qty 1

## 2018-03-12 MED ORDER — ALBUTEROL SULFATE (2.5 MG/3ML) 0.083% IN NEBU
3.0000 mL | INHALATION_SOLUTION | Freq: Four times a day (QID) | RESPIRATORY_TRACT | Status: DC | PRN
  Administered 2018-03-14: 3 mL via RESPIRATORY_TRACT
  Filled 2018-03-12: qty 3

## 2018-03-12 MED ORDER — ACETAMINOPHEN 650 MG RE SUPP: 650.0000 mg | Freq: Four times a day (QID) | RECTAL | Status: DC | PRN

## 2018-03-12 MED ORDER — ATORVASTATIN CALCIUM 20 MG PO TABS
20.0000 mg | ORAL_TABLET | Freq: Every day | ORAL | Status: DC
  Administered 2018-03-12 – 2018-03-15 (×4): 20 mg via ORAL
  Filled 2018-03-12 (×4): qty 1

## 2018-03-12 MED ORDER — SODIUM CHLORIDE 0.9% FLUSH
3.0000 mL | Freq: Two times a day (BID) | INTRAVENOUS | Status: DC
  Administered 2018-03-13 – 2018-03-16 (×7): 3 mL via INTRAVENOUS

## 2018-03-12 MED ORDER — PANTOPRAZOLE SODIUM 40 MG PO TBEC
40.0000 mg | DELAYED_RELEASE_TABLET | Freq: Every day | ORAL | Status: DC
  Administered 2018-03-12 – 2018-03-16 (×5): 40 mg via ORAL
  Filled 2018-03-12 (×5): qty 1

## 2018-03-12 NOTE — ED Notes (Addendum)
Patient receiving  blood at this time. Unable to draw labs at this time.

## 2018-03-12 NOTE — ED Triage Notes (Signed)
Pt presents to ED for assessment of central chest pain upon waking up.  Patient states he has been passing blood in his stool since yesterday as well.  C/o mild SOB, c/o some nausea.  Patient denies radiation.  States pain 1/10 at this time.

## 2018-03-12 NOTE — Telephone Encounter (Signed)
The patient called the answering service after-hours today. Recently seen in the office yesterday and meds adjusted. This morning had episode of prolonged angina at rest that was present while lying in bed, when CP usually comes with heavy exertion. Required 5 SL NTG sprays to give partial relief; BP dropped to 90s systolic -> now due for multiple cardiac meds and BP has come up to 120 systolic range. Recently admitted for bradycardia; complex PMH noted. Given prolonged angina this AM which was unusual for him, I cannot reassure that acute process is not occurring over the phone. I have advised he come to ER for further evaluation. The patient verbalized understanding and gratitude and wife will bring him.  Halil Rentz PA-C

## 2018-03-12 NOTE — ED Notes (Signed)
Pt has tro 0.08  Called to dr Criss Alvine  Will go to next bed

## 2018-03-12 NOTE — H&P (Signed)
History and Physical    Jorge Cherry:[id number] DOB: 04-23-1936 DOA: 03/12/2018  PCP: Chilton Greathouse, MD  Patient coming from: Home  I have personally briefly reviewed patient's old medical records in [facility]  Chief Complaint: Bloody stool and angina  HPI: Jorge Cherry is a 82 y.o. male with medical history significant CAD s/p CABG and PCI with DES, paroxysmal atrial fibrillation on Eliquis, hypertension, CKD stage III, hyperlipidemia, and diverticulosis who presented to the ED with chest pain and 3 days of blood in stool.  Patient was recently admitted at [facility] from 03/02/2018 to 03/04/2018 for near syncopal episode after mechanical fall and bradycardia.  His metoprolol was held on discharge and is amiodarone dose was reduced from twice a day to once a day due to QT prolongation.  He was also discharged on a heart monitor.  Patient states he felt very constipated and was able to have a bowel movement during hospitalization.  He was given a bowel regimen and eventually had a bowel movement of normal-appearing hard stool.  He says he has been having to strain very hard to have bowel movements.  3 days ago he began to see dark red blood in stool.  On 03/11/2018 he followed up with cardiology.  He has apparently been going in and out of atrial fibrillation based on his cardiac monitor.  He was told to switch his amlodipine to nighttime and recommended to stop his hydralazine for systolic blood pressure was consistently below 110.  This morning patient woke up with retrosternal dull chest pain which he describes similar to his prior anginal episodes.  Intensity was about 3 or 4/10 and did not radiate.  He used nitroglycerin spray at home with some improvement.  He says his chest pain is now 1-2/10.  He has some associated lightheadedness.  He denies any dyspnea, palpitations, nausea, vomiting, diaphoresis.  He decided to present to the ED due to his chest pain.  He  says he had another bowel movement here consisting of bright red blood.  ED Course:  Initial vitals, BP 107/60, Pulse 76, RR 16, Temp 97.79F, SpO2 100% on RA. Orthostatics showed drop in DBP from 71 to 53 from lying to standing. Hgb was 6.6 compared to 10.7 on 03/03/18. Cr 2.18 compared to 1.57 on 03/03/18 I-STAT troponin 0.08.  EKG showed normal sinus rhythm, right bundle branch block/bifascicular block. RBBB not seen on last EKG but present on other prior EKGs.  Chest x-ray showed cardiomegaly, prior sternotomy wires, no acute cardiopulmonary process.  Review of Systems: As per HPI otherwise 10 point review of systems negative.    Past Medical History:  Diagnosis Date  . Arthritis    "just a touch in my hands" (11/24/2016)  . Atherosclerosis of renal artery (HCC)    RENAL DOPPLER, 12/10/2011 - Left renal artery demonstrated narrowing with elevated velocities consistent with a 1-59% diameter reduction  . CKD (chronic kidney disease) stage 3, GFR 30-59 ml/min (HCC)    "stable now since they backed off the water pills" (11/24/2016)  . Coronary artery disease    a. 1994 s/p CABG x 4 (LIMA-LAD, VG->D2, VG->OM, VG->RCA); b. 02/2004 PCI SVG-D2 (3.5x16 Taxus DES). VG->RCA 100. Sev apical LAD dzs distal to LIMA insertion; c. 11/2016 Cath: LAD 95p/165m, D2 100ost, LCX 100ost, 70p/m, RCA 100p/m, RPDA fills via L->R collats. VG->RCA 100, VG->D2 20 ost, patent prox stent, 46m, LIMA->LAD ok, VG->OM3 20p. EF 55%-->Med Rx.  . GERD (gastroesophageal reflux disease)   .  High cholesterol   . History of lower GI bleeding    a. 09/2014 GIB due to diverticulosis/diverticulitis.  . Iron deficiency anemia   . Labile Hypertension   . Moderate aortic stenosis    a. 10/2011 Echo:  EF >55%, mild-mod TR, mild-mod AS, mod Ca2+ of AoV leaflets; b. 07/2016 Echo: EF 60-65%, no rwma, Gr1 DD, mod AS [(S) mean grad , peak grad . Valve area (VTI): 1.33cm^2, (Vmax) 1.44cm^2. Mild MR]; c. 02/2018 Echo: EF 55-60%, no  rwma, GR1 DD, Mod AS [Peak Vel (S): 319cm/s, Mean grad (S) , peak grad (S) 63mmHg].  [name] PAF (paroxysmal atrial fibrillation) (HCC)    a. CHA2DS2VASc = 4-->Eliquis.    Past Surgical History:  Procedure Laterality Date  . CARDIAC CATHETERIZATION  02/27/2004   Coronary intervention and medical management  . CARDIAC CATHETERIZATION  11/24/2016  . CATARACT EXTRACTION W/ INTRAOCULAR LENS IMPLANT Left   . CORONARY ANGIOPLASTY  1994 X 2   "before bypass surgery"  . CORONARY ANGIOPLASTY WITH STENT PLACEMENT  03/04/2004   SVG supplying the diagonal vessel stented with a 3.5x90mm Taxus stent post dilated to 4.0 mm  . CORONARY ARTERY BYPASS GRAFT  1994   "CABG X4"  . INGUINAL HERNIA REPAIR Right   . RIGHT HEART CATH AND CORONARY/GRAFT ANGIOGRAPHY N/A 11/24/2016   Procedure: Right Heart Cath and Coronary/Graft Angiography;  Surgeon: Lennette Bihari, MD;  Location: [facility] INVASIVE CV LAB;  Service: Cardiovascular;  Laterality: N/A;  . TONSILLECTOMY AND ADENOIDECTOMY       reports that he has never smoked. He has never used smokeless tobacco. He reports that he does not drink alcohol or use drugs.  Allergies  Allergen Reactions  . Altace [Ramipril] Swelling and Other (See Comments)    Mouth swelling  . Mucinex [Guaifenesin Er] Hives, Swelling and Other (See Comments)    Mouth swelling  . Contrast Media [Iodinated Diagnostic Agents] Other (See Comments)    Made eyes change each time    Family History  Problem Relation Age of Onset  . Cancer Mother   . Heart disease Father        died age 67  . Stroke Maternal Grandmother   . Cancer Maternal Grandfather      Prior to Admission medications   Medication Sig Start Date End Date Taking? Authorizing Provider  acetaminophen (TYLENOL) 500 MG tablet Take 1,000 mg by mouth every 8 (eight) hours as needed. For 3-4 days for pain    [provider]  albuterol (PROAIR HFA) 108 (90 Base) MCG/ACT inhaler INHALE TWO PUFFS EVERY 4-6 HOURS AS  NEEDED FOR COUGH OR WHEEZE 01/18/18   Kozlow, Alvira Philips, MD  amiodarone (PACERONE) 200 MG tablet Take 1 tablet (200 mg total) by mouth daily. 03/04/18   Auburn Bilberry, MD  amLODipine (NORVASC) 5 MG tablet Take 5 mg twice a day 02/11/18   Lennette Bihari, MD  aspirin EC 81 MG tablet Take 1 tablet (81 mg total) by mouth every evening. 11/25/16   Arty Baumgartner, NP  atorvastatin (LIPITOR) 20 MG tablet Take 20 mg by mouth every evening.  12/23/15   [provider]  Cholecalciferol (VITAMIN D-3 PO) Take 1,000 Units by mouth every evening.     [provider]  ELIQUIS 5 MG TABS tablet TAKE 1 TABLET BY MOUTH TWICE A DAY 02/11/18   Lennette Bihari, MD  ezetimibe (ZETIA) 10 MG tablet TAKE 1 TABLET (10 MG TOTAL) BY MOUTH DAILY. 09/27/17   Tresa Endo,  Clovis Pu, MD  fenofibrate (TRICOR) 145 MG tablet Take 72.5 mg by mouth daily.  12/10/15   [provider]  hydrALAZINE (APRESOLINE) 25 MG tablet Take 25 mg by mouth 2 (two) times daily.    [provider]  ipratropium (ATROVENT) 0.06 % nasal spray Can use two sprays in each nostril every six hours as needed to dry up nose. 01/18/18   Kozlow, Alvira Philips, MD  isosorbide mononitrate (IMDUR) 60 MG 24 hr tablet Take 2 tablets (120 mg total) by mouth every morning AND 0.5 tablets (30 mg total) every evening. 01/26/18   Lennette Bihari, MD  loratadine (CLARITIN) 10 MG tablet Take 10 mg by mouth every evening.     [provider]  nitroGLYCERIN (NITROLINGUAL) 0.4 MG/SPRAY spray PLACE 1 SPRAY UNDER THE TONGUE EVERY 5 (FIVE) MINUTES X 3 DOSES AS NEEDED FOR CHEST PAIN. 01/11/18   Abelino Derrick, PA-C  Omega-3 Fatty Acids (FISH OIL) 1000 MG CAPS Take 1 capsule by mouth 2 (two) times daily.     [provider]  pantoprazole (PROTONIX) 40 MG tablet Take 1 tablet (40 mg total) by mouth 2 (two) times daily. Patient taking differently: Take 40 mg by mouth daily.  01/18/18   Kozlow, Alvira Philips, MD  ranolazine (RANEXA) 500 MG 12 hr tablet Take 250 mg  by mouth 2 (two) times daily.     [provider]  telmisartan (MICARDIS) 80 MG tablet Take 1/2 tablet daily 02/11/18   Lennette Bihari, MD  torsemide [facility]) 20 MG tablet Take 1/2 tablet daily 02/11/18   Lennette Bihari, MD  vitamin C (ASCORBIC ACID) 500 MG tablet Take 500 mg by mouth daily.    [provider]  vitamin E 400 UNIT capsule Take 400 Units by mouth daily.    [provider]  zinc gluconate 50 MG tablet Take 50 mg by mouth 2 (two) times a week.    [provider]    Physical Exam: Vitals:   03/12/18 1900 03/12/18 1904 03/12/18 1906 03/12/18 1930  BP: 135/74 131/67  (!) 148/77  Pulse: 72 72 73 78  Resp: (!) 23 15 16 15   Temp:   98.1 F (36.7 C)   TempSrc:   Oral   SpO2: 98% 100% 99% 99%    Constitutional: NAD, calm, comfortable, pale Eyes: PERRL, lids and conjunctivae normal  ENMT: Mucous membranes are moist. Posterior pharynx clear of any exudate or lesions.Normal dentition.  Neck: normal, supple, no masses, no thyromegaly Respiratory: clear to auscultation bilaterally, no wheezing, no crackles. Normal respiratory effort. No accessory muscle use.  Cardiovascular: Regular rate and rhythm, 3/6 systolic murmur throughout, +2 pitting edema right leg, trace edema left leg. Abdomen: no tenderness, no masses palpated. No hepatosplenomegaly. Bowel sounds positive.  Musculoskeletal: no clubbing / cyanosis. No joint deformity upper and lower extremities. Good ROM, no contractures. Normal muscle tone.  Skin: no rashes, lesions, ulcers. No induration Neurologic: CN 2-12 grossly intact. Sensation intact, DTR normal. Strength 5/5 in all 4.  Psychiatric: Normal judgment and insight. Alert and oriented x 3. Normal mood.   Labs on Admission: I have personally reviewed following labs and imaging studies  CBC: Recent Labs  Lab 03/12/18 1551  WBC 6.4  HGB 6.6*  HCT 22.3*  MCV 101.4*  PLT 260   Basic Metabolic Panel: Recent Labs  Lab  03/12/18 1551  NA 142  K 3.7  CL 116*  CO2 23  GLUCOSE 136*  BUN 41*  CREATININE 2.18*  CALCIUM 8.7*   GFR: Estimated Creatinine Clearance: 30.4 mL/min (A) (by C-G formula based on SCr of 2.18 mg/dL (H)). Liver Function Tests: No results for input(s): AST, ALT, ALKPHOS, BILITOT, PROT, ALBUMIN in the last 168 hours. No results for input(s): LIPASE, AMYLASE in the last 168 hours. No results for input(s): AMMONIA in the last 168 hours. Coagulation Profile: No results for input(s): INR, PROTIME in the last 168 hours. Cardiac Enzymes: No results for input(s): CKTOTAL, CKMB, CKMBINDEX, TROPONINI in the last 168 hours. BNP (last 3 results) No results for input(s): PROBNP in the last 8760 hours. HbA1C: No results for input(s): HGBA1C in the last 72 hours. CBG: No results for input(s): GLUCAP in the last 168 hours. Lipid Profile: No results for input(s): CHOL, HDL, LDLCALC, TRIG, CHOLHDL, LDLDIRECT in the last 72 hours. Thyroid Function Tests: No results for input(s): TSH, T4TOTAL, FREET4, T3FREE, THYROIDAB in the last 72 hours. Anemia Panel: No results for input(s): VITAMINB12, FOLATE, FERRITIN, TIBC, IRON, RETICCTPCT in the last 72 hours. Urine analysis:    Component Value Date/Time   COLORURINE YELLOW 03/02/2018 2215   APPEARANCEUR CLEAR 03/02/2018 2215   LABSPEC 1.020 03/02/2018 2215   PHURINE 5.5 03/02/2018 2215   GLUCOSEU NEGATIVE 03/02/2018 2215   HGBUR NEGATIVE 03/02/2018 2215   BILIRUBINUR NEGATIVE 03/02/2018 2215   KETONESUR NEGATIVE 03/02/2018 2215   PROTEINUR NEGATIVE 03/02/2018 2215   NITRITE NEGATIVE 03/02/2018 2215   LEUKOCYTESUR NEGATIVE 03/02/2018 2215    Radiological Exams on Admission: Dg Chest 2 View  Result Date: 03/12/2018 CLINICAL DATA:  Chest pain. EXAM: CHEST - 2 VIEW COMPARISON:  Chest x-ray dated March 02, 2018. FINDINGS: Stable cardiomegaly status post CABG. Normal pulmonary vascularity. Atherosclerotic calcification of the aortic arch. No  focal consolidation, pleural effusion, or pneumothorax. No acute osseous abnormality. External heart monitor noted. IMPRESSION: No active cardiopulmonary disease.  Stable cardiomegaly. Electronically Signed   By: Obie Dredge M.D.   On: 03/12/2018 16:50    EKG: Independently reviewed. Normal sinus rhythm, right bundle branch block/bifascicular block. RBBB not seen on last EKG but present on other prior EKGs.  Assessment/Plan Principal Problem:   Acute blood loss anemia Active Problems:   Hyperlipidemia with target LDL less than 70   Chronic renal insufficiency, stage III (moderate) (HCC)   Essential hypertension   Mild persistent asthma   Paroxysmal atrial fibrillation (HCC)   Chronic diastolic heart failure (HCC)   Coronary artery disease with exertional angina (HCC)   Goebel S Keenum is a 83 y.o. male with medical history significant CAD s/p CABG and PCI with DES, paroxysmal atrial fibrillation on Eliquis, hypertension, CKD stage III, hyperlipidemia, and diverticulosis who presented to the ED with chest pain and 3 days of blood in stool. Hgb dropped from 10.7 on 03/03/18 to 6.6 on admission.  Acute blood loss anemia: Patient with a 4 g drop in hemoglobin compared to 9 days ago, likely secondary to diverticular bleed.  He has a known history of diverticulosis and last had a colonoscopy in August 2013.  In March 2013 he had a large adenomatous polyp removed.  He is currently hemodynamically stable. -Holding Eliquis and aspirin -Typed and screened -Transfusing 1 unit PRBC, follow-up post transfusion H&H -Goal hemoglobin 8.0 given cardiac disease, transfuse as needed -Clear liquid diet now, n.p.o. at midnight -Consult GI in a.m.  Angina and history of CAD s/p CABG 1994 and PCI w/ DES 2005: Patient reporting angina at rest similar to his previous episodes.  Suspect  this is secondary to poor perfusion in the setting of acute blood loss anemia. -Trend troponins -Holding aspirin as  above -Continue home atorvastatin, Zetia, Imdur, and Ranexa -Metoprolol has been on hold since last admission due to bradycardia -Nitroglycerin as needed  Acute on CKD stage III: Creatinine increased to 2.18 compared to 1.57 nine days ago.  Suspect secondary to poor perfusion from acute blood loss anemia. -Holding home telmisartan and torsemide  Paroxysmal atrial fibrillation: CHA2DS2-VASc Score 5. Currently in sinus rhythm, rate controlled. -Continue amiodarone -Holding Eliquis and metoprolol as above  Hypertension: Blood pressure stable to slightly elevated. -resume home amlodipine -hold hydralazine with intermittent hypotension  Chronic diastolic CHF: Currently stable, volume status looks okay.  Holding torsemide with AKI. -Strict I/O's, daily weights  Hyperlipidemia: -Continue atorvastatin and Zetia  Asthma: -Albuterol nebs as needed  DVT prophylaxis: SCDs  Code Status: Full code Family Communication: Discussed with wife, Loxley Schmale at bedside Disposition Plan: Pending resolution of GI bleed, stabilization of hemoglobin, and chest pain workup  Consults called: GI in AM  Admission status: Observation for now, may need to change to inpatient status if recurrent bleed or need for endoscopic procedures.   Darreld Mclean MD [facility] Pager [phone]  If 7PM-7AM, please contact night-coverage [email or web address] Password [facility]  03/12/2018, 8:23 PM

## 2018-03-12 NOTE — ED Provider Notes (Addendum)
[facility] EMERGENCY DEPARTMENT Provider Note   CSN: [id number] Arrival date & time: 03/12/18  1531     History   Chief Complaint Chief Complaint  Patient presents with  . Chest Pain    HPI Jorge Cherry is a 82 y.o. male.  HPI  82 year old male presents with chest pain.  Started this morning around 5 AM.  It woke him up from sleep.  It has been mild throughout the day and constant.  It was about a 2 out of 10 but now is about a 1 out of 10.  The patient states he has coronary disease and is status post a CABG in the 1990s and status post a stent in the early 2000's.  The patient is on Eliquis for atrial fibrillation.  Some shortness of breath.  A little bit of dizziness.  He has also noticed dark and bright red blood per rectum for about 3 days.  This is occurring multiple times per day.  There is no abdominal pain or vomiting.  No NSAID use.  He does use a baby aspirin as instructed.  He had a history of diverticulosis and hemorrhoids but he denies any pain at this time.  Past Medical History:  Diagnosis Date  . Arthritis    "just a touch in my hands" (11/24/2016)  . Atherosclerosis of renal artery (HCC)    RENAL DOPPLER, 12/10/2011 - Left renal artery demonstrated narrowing with elevated velocities consistent with a 1-59% diameter reduction  . CKD (chronic kidney disease) stage 3, GFR 30-59 ml/min (HCC)    "stable now since they backed off the water pills" (11/24/2016)  . Coronary artery disease    a. 1994 s/p CABG x 4 (LIMA-LAD, VG->D2, VG->OM, VG->RCA); b. 02/2004 PCI SVG-D2 (3.5x16 Taxus DES). VG->RCA 100. Sev apical LAD dzs distal to LIMA insertion; c. 11/2016 Cath: LAD 95p/135m, D2 100ost, LCX 100ost, 70p/m, RCA 100p/m, RPDA fills via L->R collats. VG->RCA 100, VG->D2 20 ost, patent prox stent, 74m, LIMA->LAD ok, VG->OM3 20p. EF 55%-->Med Rx.  . GERD (gastroesophageal reflux disease)   . High cholesterol   . History of lower GI bleeding    a. 09/2014 GIB due to  diverticulosis/diverticulitis.  . Iron deficiency anemia   . Labile Hypertension   . Moderate aortic stenosis    a. 10/2011 Echo:  EF >55%, mild-mod TR, mild-mod AS, mod Ca2+ of AoV leaflets; b. 07/2016 Echo: EF 60-65%, no rwma, Gr1 DD, mod AS [(S) mean grad , peak grad . Valve area (VTI): 1.33cm^2, (Vmax) 1.44cm^2. Mild MR]; c. 02/2018 Echo: EF 55-60%, no rwma, GR1 DD, Mod AS [Peak Vel (S): 319cm/s, Mean grad (S) , peak grad (S) 68mmHg].  [name] PAF (paroxysmal atrial fibrillation) (HCC)    a. CHA2DS2VASc = 4-->Eliquis.    Patient Active Problem List   Diagnosis Date Noted  . Acute blood loss anemia 03/12/2018  . Bradycardia 03/03/2018  . Chronic anticoagulation 12/14/2017  . Mild intermittent asthma without complication 07/22/2017  . LPRD (laryngopharyngeal reflux disease) 07/22/2017  . Anemia 11/25/2016  . Coronary artery disease with exertional angina (HCC) 11/24/2016  . Exertional angina (HCC)   . Chronic diastolic heart failure (HCC) 05/26/2016  . Junctional (nodal) bradycardia 11/20/2015  . Paroxysmal atrial fibrillation (HCC) 11/20/2015  . Hyperlipidemia LDL goal <70 05/03/2015  . Mild persistent asthma 03/19/2015  . Allergic rhinitis 03/19/2015  . Gastroesophageal reflux disease 03/19/2015  . Essential hypertension 02/07/2015  . Chest pain 01/22/2015  . Pain in the chest   .  Chronic renal insufficiency, stage III (moderate) (HCC) 12/28/2014  . Lower GI bleed 09/24/2014  . Diverticulosis of colon with hemorrhage 09/24/2014  . Symptomatic anemia 09/24/2014  . Aortic valve stenosis 07/22/2013  . Hyperlipidemia with target LDL less than 70 10/27/2012  . Edema 10/27/2012  . Hearing decreased 10/27/2012  . Hx of CABG 10/27/2012  . Coronary atherosclerosis of native coronary artery 10/27/2012  . Benign hypertensive heart disease without heart failure 10/27/2012    Past Surgical History:  Procedure Laterality Date  . CARDIAC CATHETERIZATION  02/27/2004    Coronary intervention and medical management  . CARDIAC CATHETERIZATION  11/24/2016  . CATARACT EXTRACTION W/ INTRAOCULAR LENS IMPLANT Left   . CORONARY ANGIOPLASTY  1994 X 2   "before bypass surgery"  . CORONARY ANGIOPLASTY WITH STENT PLACEMENT  03/04/2004   SVG supplying the diagonal vessel stented with a 3.5x97mm Taxus stent post dilated to 4.0 mm  . CORONARY ARTERY BYPASS GRAFT  1994   "CABG X4"  . INGUINAL HERNIA REPAIR Right   . RIGHT HEART CATH AND CORONARY/GRAFT ANGIOGRAPHY N/A 11/24/2016   Procedure: Right Heart Cath and Coronary/Graft Angiography;  Surgeon: Lennette Bihari, MD;  Location: [facility] INVASIVE CV LAB;  Service: Cardiovascular;  Laterality: N/A;  . TONSILLECTOMY AND ADENOIDECTOMY          Home Medications    Prior to Admission medications   Medication Sig Start Date End Date Taking? Authorizing Provider  acetaminophen (TYLENOL) 500 MG tablet Take 1,000 mg by mouth every 8 (eight) hours as needed. For 3-4 days for pain    [provider]  albuterol (PROAIR HFA) 108 (90 Base) MCG/ACT inhaler INHALE TWO PUFFS EVERY 4-6 HOURS AS NEEDED FOR COUGH OR WHEEZE 01/18/18   Kozlow, Alvira Philips, MD  amiodarone (PACERONE) 200 MG tablet Take 1 tablet (200 mg total) by mouth daily. 03/04/18   Auburn Bilberry, MD  amLODipine (NORVASC) 5 MG tablet Take 5 mg twice a day 02/11/18   Lennette Bihari, MD  aspirin EC 81 MG tablet Take 1 tablet (81 mg total) by mouth every evening. 11/25/16   Arty Baumgartner, NP  atorvastatin (LIPITOR) 20 MG tablet Take 20 mg by mouth every evening.  12/23/15   [provider]  Cholecalciferol (VITAMIN D-3 PO) Take 1,000 Units by mouth every evening.     [provider]  ELIQUIS 5 MG TABS tablet TAKE 1 TABLET BY MOUTH TWICE A DAY 02/11/18   Lennette Bihari, MD  ezetimibe (ZETIA) 10 MG tablet TAKE 1 TABLET (10 MG TOTAL) BY MOUTH DAILY. 09/27/17   Lennette Bihari, MD  fenofibrate (TRICOR) 145 MG tablet Take 72.5 mg by mouth daily.  12/10/15    [provider]  hydrALAZINE (APRESOLINE) 25 MG tablet Take 25 mg by mouth 2 (two) times daily.    [provider]  ipratropium (ATROVENT) 0.06 % nasal spray Can use two sprays in each nostril every six hours as needed to dry up nose. 01/18/18   Kozlow, Alvira Philips, MD  isosorbide mononitrate (IMDUR) 60 MG 24 hr tablet Take 2 tablets (120 mg total) by mouth every morning AND 0.5 tablets (30 mg total) every evening. 01/26/18   Lennette Bihari, MD  loratadine (CLARITIN) 10 MG tablet Take 10 mg by mouth every evening.     [provider]  nitroGLYCERIN (NITROLINGUAL) 0.4 MG/SPRAY spray PLACE 1 SPRAY UNDER THE TONGUE EVERY 5 (FIVE) MINUTES X 3 DOSES AS NEEDED FOR CHEST PAIN. 01/11/18  Kilroy, Luke K, PA-C  Omega-3 Fatty Acids (FISH OIL) 1000 MG CAPS Take 1 capsule by mouth 2 (two) times daily.     [provider]  pantoprazole (PROTONIX) 40 MG tablet Take 1 tablet (40 mg total) by mouth 2 (two) times daily. Patient taking differently: Take 40 mg by mouth daily.  01/18/18   Kozlow, Alvira Philips, MD  ranolazine (RANEXA) 500 MG 12 hr tablet Take 250 mg by mouth 2 (two) times daily.     [provider]  telmisartan (MICARDIS) 80 MG tablet Take 1/2 tablet daily 02/11/18   Lennette Bihari, MD  torsemide [facility]) 20 MG tablet Take 1/2 tablet daily 02/11/18   Lennette Bihari, MD  vitamin C (ASCORBIC ACID) 500 MG tablet Take 500 mg by mouth daily.    [provider]  vitamin E 400 UNIT capsule Take 400 Units by mouth daily.    [provider]  zinc gluconate 50 MG tablet Take 50 mg by mouth 2 (two) times a week.    [provider]    Family History Family History  Problem Relation Age of Onset  . Cancer Mother   . Heart disease Father        died age 85  . Stroke Maternal Grandmother   . Cancer Maternal Grandfather     Social History Social History   Tobacco Use  . Smoking status: Never Smoker  . Smokeless tobacco: Never Used  Substance Use  Topics  . Alcohol use: No    Alcohol/week: 0.0 standard drinks  . Drug use: No     Allergies   Altace [ramipril]; Mucinex [guaifenesin er]; and Contrast media [iodinated diagnostic agents]   Review of Systems Review of Systems  Respiratory: Positive for shortness of breath.   Cardiovascular: Positive for chest pain.  Gastrointestinal: Positive for blood in stool. Negative for abdominal pain, nausea and vomiting.  Neurological: Positive for dizziness.  All other systems reviewed and are negative.    Physical Exam Updated Vital Signs BP 120/77 (BP Location: Right Arm)   Pulse 72   Temp 97.9 F (36.6 C) (Oral)   Resp 18   SpO2 95%   Physical Exam  Constitutional: He appears well-developed and well-nourished.  Non-toxic appearance. He does not appear ill.  HENT:  Head: Normocephalic and atraumatic.  Right Ear: External ear normal.  Left Ear: External ear normal.  Nose: Nose normal.  Eyes: Right eye exhibits no discharge. Left eye exhibits no discharge.  Neck: Neck supple.  Cardiovascular: Normal rate and regular rhythm.  Murmur heard. Pulmonary/Chest: Effort normal and breath sounds normal.  Abdominal: Soft. There is no tenderness.  Genitourinary: Rectal exam shows external hemorrhoid (nonthrombosed) and guaiac positive stool. Rectal exam shows no tenderness.  Musculoskeletal: He exhibits no edema.  Neurological: He is alert.  Skin: Skin is warm and dry.  Psychiatric: His mood appears not anxious.  Nursing note and vitals reviewed.    ED Treatments / Results  Labs (all labs ordered are listed, but only abnormal results are displayed) Labs Reviewed  BASIC METABOLIC PANEL - Abnormal; Notable for the following components:      Result Value   Chloride 116 (*)    Glucose, Bld 136 (*)    BUN 41 (*)    Creatinine, Ser 2.18 (*)    Calcium 8.7 (*)    GFR calc non Af Amer 27 (*)    GFR calc Af Amer 31 (*)    Anion gap 3 (*)  All other components within normal  limits  CBC - Abnormal; Notable for the following components:   RBC 2.20 (*)    Hemoglobin 6.6 (*)    HCT 22.3 (*)    MCV 101.4 (*)    MCHC 29.6 (*)    RDW 15.7 (*)    All other components within normal limits  I-STAT TROPONIN, ED  POC OCCULT BLOOD, ED  TYPE AND SCREEN  PREPARE RBC (CROSSMATCH)    EKG EKG Interpretation  Date/Time:  Saturday March 12 2018 15:38:04 EDT Ventricular Rate:  80 PR Interval:  196 QRS Duration: 198 QT Interval:  544 QTC Calculation: 627 R Axis:   -64 Text Interpretation:  Normal sinus rhythm Right bundle branch block Left anterior fascicular block * Bifascicular block * Left ventricular hypertrophy with repolarization abnormality Cannot rule out Septal infarct , age undetermined Abnormal ECG ST/T changes new since Oct 2019 Confirmed by Pricilla Loveless [phone]) on 03/12/2018 5:14:40 PM   Radiology Dg Chest 2 View  Result Date: 03/12/2018 CLINICAL DATA:  Chest pain. EXAM: CHEST - 2 VIEW COMPARISON:  Chest x-ray dated March 02, 2018. FINDINGS: Stable cardiomegaly status post CABG. Normal pulmonary vascularity. Atherosclerotic calcification of the aortic arch. No focal consolidation, pleural effusion, or pneumothorax. No acute osseous abnormality. External heart monitor noted. IMPRESSION: No active cardiopulmonary disease.  Stable cardiomegaly. Electronically Signed   By: Obie Dredge M.D.   On: 03/12/2018 16:50    Procedures .Critical Care Performed by: Pricilla Loveless, MD Authorized by: Pricilla Loveless, MD   Critical care provider statement:    Critical care time (minutes):  30   Critical care time was exclusive of:  Separately billable procedures and treating other patients   Critical care was necessary to treat or prevent imminent or life-threatening deterioration of the following conditions:  Circulatory failure, shock and cardiac failure   Critical care was time spent personally by me on the following activities:  Development of treatment  plan with patient or surrogate, discussions with consultants, evaluation of patient's response to treatment, examination of patient, obtaining history from patient or surrogate, ordering and performing treatments and interventions, ordering and review of laboratory studies, ordering and review of radiographic studies, pulse oximetry, re-evaluation of patient's condition and review of old charts   (including critical care time)  Medications Ordered in ED Medications  0.9 %  sodium chloride infusion (has no administration in time range)     Initial Impression / Assessment and Plan / ED Course  I have reviewed the triage vital signs and the nursing notes.  Pertinent labs & imaging results that were available during my care of the patient were reviewed by me and considered in my medical decision making (see chart for details).     I discussed the patient's change in ECG with triage on-call, Dr. Enid Skeens.  He feels like this is not ischemic necessarily and that any chest pain he is having is probably more secondary to the new low hemoglobin. No STEMI. Agrees with stopping eliquis, especially since it's for afib. He is found to have obvious active rectal bleeding.  He is not unstable but will need a blood transfusion.  This is been ordered after consent with patient.  Probably diverticular given his prior history of diverticulosis and no abdominal pain at this time.  Doubt upper GI bleeding.  Hospitalist consulted and will admit.  CHA2DS2/VAS Stroke Risk Points  Current as of 4 minutes ago     5 >= 2 Points: High Risk  1 - 1.99 Points: Medium Risk  0 Points: Low Risk    This is the only CHA2DS2/VAS Stroke Risk Points available for the past  year.:  Last Change: N/A     Details    This score determines the patient's risk of having a stroke if the  patient has atrial fibrillation.       Points Metrics  1 Has Congestive Heart Failure:  Yes    Current as of 4 minutes ago  1 Has Vascular  Disease:  Yes    Current as of 4 minutes ago  1 Has Hypertension:  Yes    Current as of 4 minutes ago  2 Age:  75    Current as of 4 minutes ago  0 Has Diabetes:  No    Current as of 4 minutes ago  0 Had Stroke:  No  Had TIA:  No  Had thromboembolism:  No    Current as of 4 minutes ago  0 Male:  No    Current as of 4 minutes ago            Final Clinical Impressions(s) / ED Diagnoses   Final diagnoses:  Acute GI bleeding  Symptomatic anemia    ED Discharge Orders    None       Pricilla Loveless, MD 03/12/18 Andres Labrum    Pricilla Loveless, MD 03/12/18 1850

## 2018-03-13 ENCOUNTER — Other Ambulatory Visit: Payer: Self-pay

## 2018-03-13 ENCOUNTER — Observation Stay (HOSPITAL_COMMUNITY): Payer: Medicare Other

## 2018-03-13 DIAGNOSIS — K922 Gastrointestinal hemorrhage, unspecified: Secondary | ICD-10-CM | POA: Diagnosis not present

## 2018-03-13 DIAGNOSIS — N183 Chronic kidney disease, stage 3 (moderate): Secondary | ICD-10-CM | POA: Diagnosis not present

## 2018-03-13 DIAGNOSIS — I480 Paroxysmal atrial fibrillation: Secondary | ICD-10-CM

## 2018-03-13 DIAGNOSIS — I25118 Atherosclerotic heart disease of native coronary artery with other forms of angina pectoris: Secondary | ICD-10-CM

## 2018-03-13 DIAGNOSIS — D62 Acute posthemorrhagic anemia: Secondary | ICD-10-CM

## 2018-03-13 LAB — HEMOGLOBIN AND HEMATOCRIT, BLOOD
HCT: 24.5 % — ABNORMAL LOW (ref 39.0–52.0)
HEMATOCRIT: 23.5 % — AB (ref 39.0–52.0)
Hemoglobin: 7.3 g/dL — ABNORMAL LOW (ref 13.0–17.0)
Hemoglobin: 8 g/dL — ABNORMAL LOW (ref 13.0–17.0)

## 2018-03-13 LAB — RENAL FUNCTION PANEL
Albumin: 2.4 g/dL — ABNORMAL LOW (ref 3.5–5.0)
Anion gap: 9 (ref 5–15)
BUN: 34 mg/dL — ABNORMAL HIGH (ref 8–23)
CO2: 21 mmol/L — ABNORMAL LOW (ref 22–32)
Calcium: 8.5 mg/dL — ABNORMAL LOW (ref 8.9–10.3)
Chloride: 115 mmol/L — ABNORMAL HIGH (ref 98–111)
Creatinine, Ser: 1.59 mg/dL — ABNORMAL HIGH (ref 0.61–1.24)
GFR calc Af Amer: 46 mL/min — ABNORMAL LOW (ref >60)
GFR calc non Af Amer: 40 mL/min — ABNORMAL LOW (ref >60)
Glucose, Bld: 92 mg/dL (ref 70–99)
Phosphorus: 3 mg/dL (ref 2.5–4.6)
Potassium: 3.9 mmol/L (ref 3.5–5.1)
Sodium: 145 mmol/L (ref 135–145)

## 2018-03-13 LAB — CBC
HEMATOCRIT: 25.3 % — AB (ref 39.0–52.0)
HEMOGLOBIN: 8.2 g/dL — AB (ref 13.0–17.0)
MCH: 30.4 pg (ref 26.0–34.0)
MCHC: 32.4 g/dL (ref 30.0–36.0)
MCV: 93.7 fL (ref 80.0–100.0)
NRBC: 0 % (ref 0.0–0.2)
Platelets: 211 10*3/uL (ref 150–400)
RBC: 2.7 MIL/uL — ABNORMAL LOW (ref 4.22–5.81)
RDW: 17.2 % — ABNORMAL HIGH (ref 11.5–15.5)
WBC: 6.5 10*3/uL (ref 4.0–10.5)

## 2018-03-13 LAB — BASIC METABOLIC PANEL
ANION GAP: 6 (ref 5–15)
BUN: 35 mg/dL — ABNORMAL HIGH (ref 8–23)
CHLORIDE: 117 mmol/L — AB (ref 98–111)
CO2: 21 mmol/L — AB (ref 22–32)
Calcium: 8.3 mg/dL — ABNORMAL LOW (ref 8.9–10.3)
Creatinine, Ser: 1.62 mg/dL — ABNORMAL HIGH (ref 0.61–1.24)
GFR calc non Af Amer: 39 mL/min — ABNORMAL LOW (ref >60)
GFR, EST AFRICAN AMERICAN: 45 mL/min — AB (ref >60)
Glucose, Bld: 96 mg/dL (ref 70–99)
Potassium: 4 mmol/L (ref 3.5–5.1)
Sodium: 144 mmol/L (ref 135–145)

## 2018-03-13 LAB — TROPONIN I
TROPONIN I: 3.31 ng/mL — AB (ref <0.03)
Troponin I: 0.25 ng/mL (ref <0.03)
Troponin I: 2.49 ng/mL (ref <0.03)

## 2018-03-13 LAB — PREPARE RBC (CROSSMATCH)

## 2018-03-13 LAB — MAGNESIUM: MAGNESIUM: 2 mg/dL (ref 1.7–2.4)

## 2018-03-13 MED ORDER — SODIUM CHLORIDE 0.9% IV SOLUTION
Freq: Once | INTRAVENOUS | Status: AC
  Administered 2018-03-13: 04:00:00 via INTRAVENOUS

## 2018-03-13 MED ORDER — SODIUM CHLORIDE 0.9% IV SOLUTION
Freq: Once | INTRAVENOUS | Status: AC
  Administered 2018-03-13: 22:00:00 via INTRAVENOUS

## 2018-03-13 MED ORDER — POLYETHYLENE GLYCOL 3350 17 G PO PACK
17.0000 g | PACK | Freq: Every day | ORAL | Status: DC
  Administered 2018-03-13 – 2018-03-16 (×3): 17 g via ORAL
  Filled 2018-03-13 (×4): qty 1

## 2018-03-13 MED ORDER — HYDROCORTISONE ACETATE 25 MG RE SUPP
25.0000 mg | Freq: Two times a day (BID) | RECTAL | Status: DC
  Administered 2018-03-13 – 2018-03-16 (×7): 25 mg via RECTAL
  Filled 2018-03-13 (×7): qty 1

## 2018-03-13 NOTE — Progress Notes (Signed)
PROGRESS NOTE    Jorge Cherry  NGE:[id number] DOB: 07-11-1935 DOA: 03/12/2018 PCP: Chilton Greathouse, MD  Outpatient Specialists:   Brief Narrative:  Jorge Cherry is a 82 year old male, with past medical history significant for CAD s/p CABG and PCI with DES,  at least moderate aortic valve stenosis with severely calcified aortic valve, paroxysmal atrial fibrillation on Eliquis, hypertension, CKD stage III, hyperlipidemia, prior GI bleeds, hemorrhoid and diverticulosis.  Patient's problems started about 2 days prior to presentation with constipation, GI bleed that was said to be initially maroon but it became bright red and chest pain.  Patient was admitted to wait hemoglobin of 6.6 g/dL.  Patient has been transfused with 2 units of packed red blood cells.  Last hemoglobin checked was about 10 hours ago, was 7.3 g/dL.  Repeat hemoglobin is pending (i.e. after transfusion of packed red blood cells).  Troponin was elevated at 0.25.  I have consulted the cardiology team, Dr. Rennis Golden.  I discussed personally with Dr. Rennis Golden.  Consulted the GI team, [facility].  [facility] office was called (phone #[phone]).  I have left patient's information with the GI office, and Dr. Levora Angel is on call for the GI team.  Further management will depend on hospital course.   Assessment & Plan:   Principal Problem:   Acute blood loss anemia Active Problems:   Hyperlipidemia with target LDL less than 70   Chronic renal insufficiency, stage III (moderate) (HCC)   Essential hypertension   Mild persistent asthma   Paroxysmal atrial fibrillation (HCC)   Chronic diastolic heart failure (HCC)   Coronary artery disease with exertional angina (HCC)   Acute blood loss anemia: Patient with a 4 g drop in hemoglobin compared to 9 days ago, likely secondary to diverticular bleed.  He has a known history of diverticulosis and last had a colonoscopy in August 2013.  In March 2013 he had a large adenomatous polyp removed.  He is  currently hemodynamically stable. -Holding Eliquis and aspirin -Typed and screened -Transfusing 1 unit PRBC, follow-up post transfusion H&H -Goal hemoglobin 8.0 given cardiac disease, transfuse as needed -Clear liquid diet now, n.p.o. at midnight -Consult GI in a.m.  03/13/2018: I have consulted cardiology team I spoke to Dr. Iona Coach.  Consulted GI team.  We will continue to monitor H/H.  We will continue to trend troponins.  Will transfuse packed red blood cells as deemed necessary.  Angina and history of CAD s/p CABG 1994 and PCI w/ DES 2005: Patient reporting angina at rest similar to his previous episodes.  Suspect this is secondary to poor perfusion in the setting of acute blood loss anemia. -Trend troponins -Holding aspirin as above -Continue home atorvastatin, Zetia, Imdur, and Ranexa -Metoprolol has been on hold since last admission due to bradycardia -Nitroglycerin as needed  03/13/2018: Cardiology team consulted.  Continue to trend troponin.  Acute on CKD stage III: Creatinine increased to 2.18 compared to 1.57 nine days ago.  Suspect secondary to poor perfusion from acute blood loss anemia. -Holding home telmisartan and torsemide  03/13/2018: Worsening AKI.  Will work-up patient's AKI.  Will check urinalysis, urine sodium, urine protein, urine creatinine, ANA and renal ultrasound.  Will also repeat renal panel.  Paroxysmal atrial fibrillation: CHA2DS2-VASc Score 5. Currently in sinus rhythm, rate controlled. -Continue amiodarone -Holding Eliquis and metoprolol as above  Hypertension: Blood pressure stable to slightly elevated. -resume home amlodipine -hold hydralazine with intermittent hypotension  Chronic diastolic CHF: Currently stable, volume status looks  okay.  Holding torsemide with AKI. -Strict I/O's, daily weights  Hyperlipidemia: -Continue atorvastatin and Zetia  Asthma: -Albuterol nebs as needed  DVT prophylaxis: SCDs  Code Status: Full  code Family Communication: Discussed with wife, Jorge Cherry at bedside Disposition Plan: Pending resolution of GI bleed, stabilization of hemoglobin, and chest pain workup    Consultants:   Cardiology, Dr. Rennis Golden.  GI, Dr. Levora Angel.  Procedures:   None  Antimicrobials:   None   Subjective: No chest pain. No shortness of breath. No fever or chills.  Objective: Vitals:   03/13/18 0349 03/13/18 0419 03/13/18 0433 03/13/18 0830  BP: 126/72 124/64 122/68 140/71  Pulse: 66 65 64 67  Resp: 18 18 18 14   Temp: 98.1 F (36.7 C) 97.6 F (36.4 C) 98 F (36.7 C) 98.4 F (36.9 C)  TempSrc: Oral Oral Oral Oral  SpO2: 98% 97% 98% 98%    Intake/Output Summary (Last 24 hours) at 03/13/2018 1031 Last data filed at 03/13/2018 0830 Gross per 24 hour  Intake 657.5 ml  Output -  Net 657.5 ml   There were no vitals filed for this visit.  Examination:  General exam: Appears calm and comfortable.  Pale. Respiratory system: Clear to auscultation. Respiratory effort normal. Cardiovascular system: S1 & S2, with an ejection systolic murmur.  Bilateral leg edema.   Gastrointestinal system: Abdomen is nondistended, soft and nontender. No organomegaly or masses felt. Normal bowel sounds heard. Central nervous system: Alert and oriented. No focal neurological deficits. Extremities: Bilateral leg edema.   Psychiatry: Judgement and insight appear normal. Mood & affect appropriate.     Data Reviewed: I have personally reviewed following labs and imaging studies  CBC: Recent Labs  Lab 03/12/18 1551 03/13/18 0021  WBC 6.4  --   HGB 6.6* 7.3*  HCT 22.3* 23.5*  MCV 101.4*  --   PLT 260  --    Basic Metabolic Panel: Recent Labs  Lab 03/12/18 1551  NA 142  K 3.7  CL 116*  CO2 23  GLUCOSE 136*  BUN 41*  CREATININE 2.18*  CALCIUM 8.7*   GFR: Estimated Creatinine Clearance: 30.4 mL/min (A) (by C-G formula based on SCr of 2.18 mg/dL (H)). Liver Function Tests: No results for  input(s): AST, ALT, ALKPHOS, BILITOT, PROT, ALBUMIN in the last 168 hours. No results for input(s): LIPASE, AMYLASE in the last 168 hours. No results for input(s): AMMONIA in the last 168 hours. Coagulation Profile: No results for input(s): INR, PROTIME in the last 168 hours. Cardiac Enzymes: Recent Labs  Lab 03/13/18 0021  TROPONINI 0.25*   BNP (last 3 results) No results for input(s): PROBNP in the last 8760 hours. HbA1C: No results for input(s): HGBA1C in the last 72 hours. CBG: No results for input(s): GLUCAP in the last 168 hours. Lipid Profile: No results for input(s): CHOL, HDL, LDLCALC, TRIG, CHOLHDL, LDLDIRECT in the last 72 hours. Thyroid Function Tests: No results for input(s): TSH, T4TOTAL, FREET4, T3FREE, THYROIDAB in the last 72 hours. Anemia Panel: No results for input(s): VITAMINB12, FOLATE, FERRITIN, TIBC, IRON, RETICCTPCT in the last 72 hours. Urine analysis:    Component Value Date/Time   COLORURINE YELLOW 03/02/2018 2215   APPEARANCEUR CLEAR 03/02/2018 2215   LABSPEC 1.020 03/02/2018 2215   PHURINE 5.5 03/02/2018 2215   GLUCOSEU NEGATIVE 03/02/2018 2215   HGBUR NEGATIVE 03/02/2018 2215   BILIRUBINUR NEGATIVE 03/02/2018 2215   KETONESUR NEGATIVE 03/02/2018 2215   PROTEINUR NEGATIVE 03/02/2018 2215   NITRITE NEGATIVE 03/02/2018 2215  LEUKOCYTESUR NEGATIVE 03/02/2018 2215   Sepsis Labs: @LABRCNTIP (procalcitonin:4,lacticidven:4)  )No results found for this or any previous visit (from the past 240 hour(s)).       Radiology Studies: Dg Chest 2 View  Result Date: 03/12/2018 CLINICAL DATA:  Chest pain. EXAM: CHEST - 2 VIEW COMPARISON:  Chest x-ray dated March 02, 2018. FINDINGS: Stable cardiomegaly status post CABG. Normal pulmonary vascularity. Atherosclerotic calcification of the aortic arch. No focal consolidation, pleural effusion, or pneumothorax. No acute osseous abnormality. External heart monitor noted. IMPRESSION: No active cardiopulmonary  disease.  Stable cardiomegaly. Electronically Signed   By: Obie Dredge M.D.   On: 03/12/2018 16:50        Scheduled Meds: . amiodarone  200 mg Oral QHS  . amLODipine  5 mg Oral QHS  . atorvastatin  20 mg Oral QHS  . ezetimibe  10 mg Oral Daily  . isosorbide mononitrate  120 mg Oral Daily  . pantoprazole  40 mg Oral Daily  . ranolazine  500 mg Oral BID  . sodium chloride flush  3 mL Intravenous Q12H   Continuous Infusions: . sodium chloride       LOS: 1 day    Time spent: 35 minutes.    Berton Mount, MD  [facility] Pager #: [phone] 7PM-7AM contact night coverage as above

## 2018-03-13 NOTE — Progress Notes (Addendum)
Critical Value received from lab: troponin 3.31. Paged doctor with results.  Orders received from attending to transfuse 1 Unit PRBC

## 2018-03-13 NOTE — Progress Notes (Addendum)
CRITICAL VALUE ALERT  Critical Value:  Troponin  2.49   Date & Time Notied:  1125 03/13/18  Provider Notified: Will page Dr. Dartha Lodge  Orders Received/Actions taken: Dr. Dartha Lodge returned call cardiology already consulted Dr. Rennis Golden

## 2018-03-13 NOTE — Consult Note (Signed)
Referring Provider:  Dr. Dartha Lodge Primary Care Physician:  Chilton Greathouse, MD Primary Gastroenterologist:  Dr. [facility]  Reason for Consultation:  GI bleed  HPI: Jorge Cherry is a 82 y.o. male with past medical history of coronary artery disease status post CABG, history of aortic stenosis and paroxysmal atrial fibrillation presented to the hospital with chest pain as well as GI bleed.  Patient has been having intermittent GI bleed since 2016.  Last colonoscopy in June 2016 by Dr. he showed left-sided diverticulosis.  Apparently patient was having constipation and after straining to go for bowel movement he started seeing bright red blood per rectum.  Denied any associated abdominal pain, nausea vomiting.  Denied weight loss.  He is on Eliquis with last dose 11/01 .    Patient had 3 colonoscopies since 2013.  Last colonoscopy in June 2016 showed left-sided diverticulosis otherwise normal. Past Medical History:  Diagnosis Date  . Arthritis    "just a touch in my hands" (11/24/2016)  . Atherosclerosis of renal artery (HCC)    RENAL DOPPLER, 12/10/2011 - Left renal artery demonstrated narrowing with elevated velocities consistent with a 1-59% diameter reduction  . CKD (chronic kidney disease) stage 3, GFR 30-59 ml/min (HCC)    "stable now since they backed off the water pills" (11/24/2016)  . Coronary artery disease    a. 1994 s/p CABG x 4 (LIMA-LAD, VG->D2, VG->OM, VG->RCA); b. 02/2004 PCI SVG-D2 (3.5x16 Taxus DES). VG->RCA 100. Sev apical LAD dzs distal to LIMA insertion; c. 11/2016 Cath: LAD 95p/171m, D2 100ost, LCX 100ost, 70p/m, RCA 100p/m, RPDA fills via L->R collats. VG->RCA 100, VG->D2 20 ost, patent prox stent, 18m, LIMA->LAD ok, VG->OM3 20p. EF 55%-->Med Rx.  . GERD (gastroesophageal reflux disease)   . High cholesterol   . History of lower GI bleeding    a. 09/2014 GIB due to diverticulosis/diverticulitis.  . Iron deficiency anemia   . Labile Hypertension   . Moderate aortic  stenosis    a. 10/2011 Echo:  EF >55%, mild-mod TR, mild-mod AS, mod Ca2+ of AoV leaflets; b. 07/2016 Echo: EF 60-65%, no rwma, Gr1 DD, mod AS [(S) mean grad , peak grad . Valve area (VTI): 1.33cm^2, (Vmax) 1.44cm^2. Mild MR]; c. 02/2018 Echo: EF 55-60%, no rwma, GR1 DD, Mod AS [Peak Vel (S): 319cm/s, Mean grad (S) , peak grad (S) 45mmHg].  [name] PAF (paroxysmal atrial fibrillation) (HCC)    a. CHA2DS2VASc = 4-->Eliquis.    Past Surgical History:  Procedure Laterality Date  . CARDIAC CATHETERIZATION  02/27/2004   Coronary intervention and medical management  . CARDIAC CATHETERIZATION  11/24/2016  . CATARACT EXTRACTION W/ INTRAOCULAR LENS IMPLANT Left   . CORONARY ANGIOPLASTY  1994 X 2   "before bypass surgery"  . CORONARY ANGIOPLASTY WITH STENT PLACEMENT  03/04/2004   SVG supplying the diagonal vessel stented with a 3.5x41mm Taxus stent post dilated to 4.0 mm  . CORONARY ARTERY BYPASS GRAFT  1994   "CABG X4"  . INGUINAL HERNIA REPAIR Right   . RIGHT HEART CATH AND CORONARY/GRAFT ANGIOGRAPHY N/A 11/24/2016   Procedure: Right Heart Cath and Coronary/Graft Angiography;  Surgeon: Lennette Bihari, MD;  Location: [facility] INVASIVE CV LAB;  Service: Cardiovascular;  Laterality: N/A;  . TONSILLECTOMY AND ADENOIDECTOMY      Prior to Admission medications   Medication Sig Start Date End Date Taking? Authorizing Provider  acetaminophen (TYLENOL) 500 MG tablet Take 1,000 mg by mouth every 8 (eight) hours as needed (for pain).  Yes [provider]  albuterol (PROAIR HFA) 108 (90 Base) MCG/ACT inhaler INHALE TWO PUFFS EVERY 4-6 HOURS AS NEEDED FOR COUGH OR WHEEZE Patient taking differently: Inhale 2 puffs into the lungs every 6 (six) hours as needed (for coughing or wheezing).  01/18/18  Yes Kozlow, Alvira Philips, MD  amiodarone (PACERONE) 200 MG tablet Take 1 tablet (200 mg total) by mouth daily. Patient taking differently: Take 200 mg by mouth at bedtime.  03/04/18  Yes Auburn Bilberry, MD   amLODipine (NORVASC) 5 MG tablet Take 5 mg twice a day Patient taking differently: Take 5 mg by mouth at bedtime.  02/11/18  Yes Lennette Bihari, MD  aspirin EC 81 MG tablet Take 1 tablet (81 mg total) by mouth every evening. Patient taking differently: Take 81 mg by mouth at bedtime.  11/25/16  Yes Laverda Page B, NP  atorvastatin (LIPITOR) 20 MG tablet Take 10 mg by mouth at bedtime.    Yes [provider]  Azelastine-Fluticasone (DYMISTA) 137-50 MCG/ACT SUSP Place 1-2 sprays into the nose daily as needed (for seasonal allergies).   Yes [provider]  Cholecalciferol (VITAMIN D-3 PO) Take 1,000 Units by mouth every evening.    Yes [provider]  ELIQUIS 5 MG TABS tablet TAKE 1 TABLET BY MOUTH TWICE A DAY Patient taking differently: Take 5 mg by mouth 2 (two) times daily.  02/11/18  Yes Lennette Bihari, MD  ezetimibe (ZETIA) 10 MG tablet TAKE 1 TABLET (10 MG TOTAL) BY MOUTH DAILY. Patient taking differently: Take 10 mg by mouth daily.  09/27/17  Yes Lennette Bihari, MD  fenofibrate (TRICOR) 145 MG tablet Take 72.5 mg by mouth daily.  12/10/15  Yes [provider]  fluticasone (FLOVENT HFA) 110 MCG/ACT inhaler Inhale 1 puff into the lungs 3 (three) times daily as needed (for seasonal flares).   Yes [provider]  hydrALAZINE (APRESOLINE) 25 MG tablet Take 25 mg by mouth See admin instructions. Take 25 mg by mouth two times a day and IF SYSTOLIC READING IS BELOW 110 ON TWO READINGS, STOP AND CALL DR. KELLY   Yes [provider]  ipratropium (ATROVENT) 0.06 % nasal spray Can use two sprays in each nostril every six hours as needed to dry up nose. Patient taking differently: Place 2 sprays into both nostrils every 6 (six) hours as needed for rhinitis.  01/18/18  Yes Kozlow, Alvira Philips, MD  isosorbide mononitrate (IMDUR) 60 MG 24 hr tablet Take 2 tablets (120 mg total) by mouth every morning AND 0.5 tablets (30 mg total) every evening. Patient  taking differently: Take 120 mg by mouth in the morning and 30 mg at bedtime 01/26/18  Yes Lennette Bihari, MD  loratadine (CLARITIN) 10 MG tablet Take 10 mg by mouth every evening.    Yes [provider]  Multiple Vitamins-Minerals (CENTRUM SILVER 50+MEN) TABS Take 1 tablet by mouth at bedtime.   Yes [provider]  nitroGLYCERIN (NITROLINGUAL) 0.4 MG/SPRAY spray PLACE 1 SPRAY UNDER THE TONGUE EVERY 5 (FIVE) MINUTES X 3 DOSES AS NEEDED FOR CHEST PAIN. 01/11/18  Yes Kilroy, Luke K, PA-C  Omega-3 Fatty Acids (FISH OIL) 1000 MG CAPS Take 1,000 mg by mouth 2 (two) times daily.    Yes [provider]  pantoprazole (PROTONIX) 40 MG tablet Take 1 tablet (40 mg total) by mouth 2 (two) times daily. Patient taking differently: Take 40 mg by mouth at bedtime.  01/18/18  Yes Kozlow, Alvira Philips, MD  ranolazine (RANEXA) 1000 MG SR tablet Take 500 mg by mouth 2 (two) times daily.   Yes [provider]  telmisartan (MICARDIS) 80 MG tablet Take 40 mg by mouth daily.  02/11/18  Yes Lennette Bihari, MD  torsemide (DEMADEX) 20 MG tablet Take 10 mg by mouth daily.  02/11/18  Yes Lennette Bihari, MD  vitamin C (ASCORBIC ACID) 500 MG tablet Take 500 mg by mouth at bedtime.    Yes [provider]  vitamin E 400 UNIT capsule Take 400 Units by mouth daily.   Yes [provider]  zinc gluconate 50 MG tablet Take 50 mg by mouth 2 (two) times a week.   Yes [provider]    Scheduled Meds: . amiodarone  200 mg Oral QHS  . amLODipine  5 mg Oral QHS  . atorvastatin  20 mg Oral QHS  . ezetimibe  10 mg Oral Daily  . isosorbide mononitrate  120 mg Oral Daily  . pantoprazole  40 mg Oral Daily  . ranolazine  500 mg Oral BID  . sodium chloride flush  3 mL Intravenous Q12H   Continuous Infusions: . sodium chloride     PRN Meds:.acetaminophen **OR** acetaminophen, albuterol, nitroGLYCERIN  Allergies as of 03/12/2018 - Review Complete 03/12/2018  Allergen Reaction Noted   . Altace [ramipril] Swelling and Other (See Comments) 03/19/2015  . Mucinex [guaifenesin er] Hives, Swelling, and Other (See Comments) 03/19/2015  . Contrast media [iodinated diagnostic agents] Other (See Comments) 10/24/2012    Family History  Problem Relation Age of Onset  . Cancer Mother   . Heart disease Father        died age 83  . Stroke Maternal Grandmother   . Cancer Maternal Grandfather     Social History   Socioeconomic History  . Marital status: Married    Spouse name: Not on file  . Number of children: 2  . Years of education: Not on file  . Highest education level: Not on file  Occupational History  . Not on file  Social Needs  . Financial resource strain: Not on file  . Food insecurity:    Worry: Not on file    Inability: Not on file  . Transportation needs:    Medical: Not on file    Non-medical: Not on file  Tobacco Use  . Smoking status: Never Smoker  . Smokeless tobacco: Never Used  Substance and Sexual Activity  . Alcohol use: No    Alcohol/week: 0.0 standard drinks  . Drug use: No  . Sexual activity: Not Currently  Lifestyle  . Physical activity:    Days per week: Not on file    Minutes per session: Not on file  . Stress: Not on file  Relationships  . Social connections:    Talks on phone: Not on file    Gets together: Not on file    Attends religious service: Not on file    Active member of club or organization: Not on file    Attends meetings of clubs or organizations: Not on file    Relationship status: Not on file  . Intimate partner violence:    Fear of current or ex partner: Not on file    Emotionally abused: Not on file    Physically abused: Not on file    Forced sexual activity: Not on file  Other Topics Concern  . Not on file  Social History Narrative   Lives at home in [facility]  with wife.  Retired from [employer].      Review of Systems: Review of Systems  Constitutional: Negative for chills and fever.   HENT: Negative for hearing loss and tinnitus.   Eyes: Negative for blurred vision and double vision.  Respiratory: Negative for cough, hemoptysis and sputum production.   Cardiovascular: Positive for chest pain and palpitations.  Gastrointestinal: Positive for blood in stool and constipation. Negative for abdominal pain, diarrhea, heartburn, nausea and vomiting.  Genitourinary: Negative for dysuria and urgency.  Musculoskeletal: Negative for back pain and myalgias.  Skin: Negative for itching and rash.  Neurological: Negative for seizures and loss of consciousness.  Endo/Heme/Allergies: Bruises/bleeds easily.  Psychiatric/Behavioral: Negative for hallucinations and suicidal ideas.    Physical Exam: Vital signs: Vitals:   03/13/18 0433 03/13/18 0830  BP: 122/68 140/71  Pulse: 64 67  Resp: 18 14  Temp: 98 F (36.7 C) 98.4 F (36.9 C)  SpO2: 98% 98%   Last BM Date: (03/12/18) Physical Exam  Constitutional: He is oriented to person, place, and time. He appears well-developed and well-nourished. No distress.  HENT:  Head: Normocephalic and atraumatic.  Mouth/Throat: Oropharynx is clear and moist. No oropharyngeal exudate.  Eyes: EOM are normal. No scleral icterus.  Neck: Normal range of motion. Neck supple.  Cardiovascular: Normal rate.  Murmur heard. Pulmonary/Chest: Effort normal and breath sounds normal. No respiratory distress.  Abdominal: Soft. Bowel sounds are normal. He exhibits no distension. There is no tenderness. There is no rebound and no guarding.  Musculoskeletal: Normal range of motion. He exhibits edema.  Neurological: He is alert and oriented to person, place, and time.  Skin: Skin is warm. No erythema.  Psychiatric: He has a normal mood and affect. Judgment and thought content normal.  Vitals reviewed.  GI:  Lab Results: Recent Labs    03/12/18 1551 03/13/18 0021 03/13/18 1002  WBC 6.4  --  6.5  HGB 6.6* 7.3* 8.2*  HCT 22.3* 23.5* 25.3*  PLT 260  --   211   BMET Recent Labs    03/12/18 1551 03/13/18 1002 03/13/18 1114  NA 142 144 145  K 3.7 4.0 3.9  CL 116* 117* 115*  CO2 23 21* 21*  GLUCOSE 136* 96 92  BUN 41* 35* 34*  CREATININE 2.18* 1.62* 1.59*  CALCIUM 8.7* 8.3* 8.5*   LFT Recent Labs    03/13/18 1114  ALBUMIN 2.4*   PT/INR No results for input(s): LABPROT, INR in the last 72 hours.   Studies/Results: Dg Chest 2 View  Result Date: 03/12/2018 CLINICAL DATA:  Chest pain. EXAM: CHEST - 2 VIEW COMPARISON:  Chest x-ray dated March 02, 2018. FINDINGS: Stable cardiomegaly status post CABG. Normal pulmonary vascularity. Atherosclerotic calcification of the aortic arch. No focal consolidation, pleural effusion, or pneumothorax. No acute osseous abnormality. External heart monitor noted. IMPRESSION: No active cardiopulmonary disease.  Stable cardiomegaly. Electronically Signed   By: Obie Dredge M.D.   On: 03/12/2018 16:50   US Renal  Result Date: 03/13/2018 CLINICAL DATA:  Acute kidney injury.  Chronic kidney disease. EXAM: RENAL / URINARY TRACT ULTRASOUND COMPLETE COMPARISON:  None. FINDINGS: Right Kidney: Renal measurements: 10.6 x 5 x 6.5 cm = volume: 177 mL. No renal mass. No hydronephrosis. Renal cortical thinning. Increased renal cortical echogenicity. Left Kidney: Renal measurements: 11.7 x 5 x 4.9 cm = volume: 152 mL. Two anechoic left renal masses measuring 2.2 x 1.7 x 2.3 cm and 2 x 1.6 x 1.6 cm respectively with increased  through transmission most consistent with cysts. No hydronephrosis. Renal cortical thinning. Increased renal cortical echogenicity. Bladder: Appears normal for degree of bladder distention. IMPRESSION: 1. Increased renal cortical echogenicity and renal cortical thinning bilaterally as can be seen with chronic medical renal disease. 2. No obstructive uropathy. Electronically Signed   By: Elige Ko   On: 03/13/2018 14:32    Impression/Plan: -Rectal bleeding.  Most likely hemorrhoidal versus  diverticular in nature.  Patient with intermittent bleeding since 2016.  Colonoscopy in June 2016 by Dr. Madilyn Fireman showed left-sided diverticulosis. -Acute blood loss anemia.  Hemoglobin 6.6 on admission. -Paroxysmal atrial fibrillation.  Last dose of anticoagulation March 11, 2018. -History of aortic stenosis -Coronary artery disease -Constipation  Recommendations ----------------------- -start Anusol HC -Miralax  for constipation -If ongoing episodes of bleeding or drop in hemoglobin, consider colonoscopy probably on Tuesday. -ok  to have liquid diet for today. -GI will follow    LOS: 1 day   Kathi Der  MD, FACP 03/13/2018, 2:44 PM  Contact #  [phone]

## 2018-03-13 NOTE — Consult Note (Signed)
CONSULTATION NOTE   Patient Name: Jorge Cherry Date of Encounter: 03/13/2018 Cardiologist: Shelva Majestic, MD  Chief Complaint   Chest pain  Patient Profile   82 yo male patient of Dr. Claiborne Billings with history of CABG times 07/27/1992, CKD 3, hypertension, dyslipidemia, aortic stenosis and PAF, who presents with chest pain and is found to be anemic in the setting of acute GI bleed.  Troponin is elevated and cardiology was consulted.  HPI   Jorge Cherry is a 82 y.o. male who is being seen today for the evaluation of chest pain and elevated troponin at the request of Dr. Marthenia Rolling. This is a 82 yo male patient of Dr. Claiborne Billings with history of CABG times 07/27/1992, CKD 3, hypertension, dyslipidemia, aortic stenosis and PAF, who presents with chest pain and is found to be anemic in the setting of acute GI bleed.  Troponin is elevated and cardiology was consulted.  Mr. Cozzolino was seen in the office on Friday for follow-up of recent hospitalization.  He was admitted at [facility] on March 02, 2018 when he apparently tripped over a tree root and fell on his chest.  He was dizzy after getting up and was noted to be clammy and diaphoretic.  When EMS was called his heart rate was in the 30s and he was given atropine.  Ultimately his amiodarone was reduced and beta-blocker was held due to possible junctional rhythm.  A Zio patch monitor was recommended.  Echo on 03/03/2018 showed EF 55 to 75%, grade 1 diastolic dysfunction, moderate aortic stenosis and mild MR.  Since discharge he had reported an episode of angina a couple days prior which was apparently chronic for him.  He is also had dizziness after taking blood pressure medicines.  He was noted to be hypotensive.  It was felt his dizziness was related to hypotension.  He was recommended to stop the hydralazine.  Lab work in the hospital yesterday shows a significant drop in hemoglobin to 6.6 down from 10.7 just 10 days prior.  He has reported  bright red blood per rectum.  He also has chest pain which is similar to his prior anginal episodes.  PMHx   Past Medical History:  Diagnosis Date  . Arthritis    "just a touch in my hands" (11/24/2016)  . Atherosclerosis of renal artery ([place])    RENAL DOPPLER, 12/10/2011 - Left renal artery demonstrated narrowing with elevated velocities consistent with a 1-59% diameter reduction  . CKD (chronic kidney disease) stage 3, GFR 30-59 ml/min (HCC)    "stable now since they backed off the water pills" (11/24/2016)  . Coronary artery disease    a. 1994 s/p CABG x 4 (LIMA-LAD, VG->D2, VG->OM, VG->RCA); b. 02/2004 PCI SVG-D2 (3.5x16 Taxus DES). VG->RCA 100. Sev apical LAD dzs distal to LIMA insertion; c. 11/2016 Cath: LAD 95p/131m D2 100ost, LCX 100ost, 70p/m, RCA 100p/m, RPDA fills via L->R collats. VG->RCA 100, VG->D2 20 ost, patent prox stent, 443mLIMA->LAD ok, VG->OM3 20p. EF 55%-->Med Rx.  . GERD (gastroesophageal reflux disease)   . High cholesterol   . History of lower GI bleeding    a. 09/2014 GIB due to diverticulosis/diverticulitis.  . Iron deficiency anemia   . Labile Hypertension   . Moderate aortic stenosis    a. 10/2011 Echo:  EF >55%, mild-mod TR, mild-mod AS, mod Ca2+ of AoV leaflets; b. 07/2016 Echo: EF 60-65%, no rwma, Gr1 DD, mod AS [(S) mean grad 213m, peak grad 36m21m Valve  area (VTI): 1.33cm^2, (Vmax) 1.44cm^2. Mild MR]; c. 02/2018 Echo: EF 55-60%, no rwma, GR1 DD, Mod AS [Peak Vel (S): 319cm/s, Mean grad (S) 66mHg, peak grad (S) 493mg].  . Marland KitchenAF (paroxysmal atrial fibrillation) (HCC)    a. CHA2DS2VASc = 4-->Eliquis.    Past Surgical History:  Procedure Laterality Date  . CARDIAC CATHETERIZATION  02/27/2004   Coronary intervention and medical management  . CARDIAC CATHETERIZATION  11/24/2016  . CATARACT EXTRACTION W/ INTRAOCULAR LENS IMPLANT Left   . CORONARY ANGIOPLASTY  1994 X 2   "before bypass surgery"  . CORONARY ANGIOPLASTY WITH STENT PLACEMENT  03/04/2004   SVG  supplying the diagonal vessel stented with a [id number] stent post dilated to 4.0 mm  . CORONARY ARTERY BYPASS GRAFT  1994   "CABG X4"  . INGUINAL HERNIA REPAIR Right   . RIGHT HEART CATH AND CORONARY/GRAFT ANGIOGRAPHY N/A 11/24/2016   Procedure: Right Heart Cath and Coronary/Graft Angiography;  Surgeon: KelTroy SineD;  Location: [facility];  Service: Cardiovascular;  Laterality: N/A;  . TONSILLECTOMY AND ADENOIDECTOMY      FAMHx   Family History  Problem Relation Age of Onset  . Cancer Mother   . Heart disease Father        died age 77 37 Stroke Maternal Grandmother   . Cancer Maternal Grandfather     SOCHx    reports that he has never smoked. He has never used smokeless tobacco. He reports that he does not drink alcohol or use drugs.  Outpatient Medications   No current facility-administered medications on file prior to encounter.    Current Outpatient Medications on File Prior to Encounter  Medication Sig Dispense Refill  . acetaminophen (TYLENOL) 500 MG tablet Take 1,000 mg by mouth every 8 (eight) hours as needed (for pain).     . aMarland Kitchenbuterol (PROAIR HFA) 108 (90 Base) MCG/ACT inhaler INHALE TWO PUFFS EVERY 4-6 HOURS AS NEEDED FOR COUGH OR WHEEZE (Patient taking differently: Inhale 2 puffs into the lungs every 6 (six) hours as needed (for coughing or wheezing). ) 1 Inhaler 1  . amiodarone (PACERONE) 200 MG tablet Take 1 tablet (200 mg total) by mouth daily. (Patient taking differently: Take 200 mg by mouth at bedtime. ) 180 tablet 3  . amLODipine (NORVASC) 5 MG tablet Take 5 mg twice a day (Patient taking differently: Take 5 mg by mouth at bedtime. ) 60 tablet 6  . aspirin EC 81 MG tablet Take 1 tablet (81 mg total) by mouth every evening. (Patient taking differently: Take 81 mg by mouth at bedtime. ) 30 tablet 0  . atorvastatin (LIPITOR) 20 MG tablet Take 10 mg by mouth at bedtime.     . Azelastine-Fluticasone (DYMISTA) 137-50 MCG/ACT SUSP Place 1-2 sprays  into the nose daily as needed (for seasonal allergies).    . Cholecalciferol (VITAMIN D-3 PO) Take 1,000 Units by mouth every evening.     . EMarland KitchenIQUIS 5 MG TABS tablet TAKE 1 TABLET BY MOUTH TWICE A DAY (Patient taking differently: Take 5 mg by mouth 2 (two) times daily. ) 180 tablet 1  . ezetimibe (ZETIA) 10 MG tablet TAKE 1 TABLET (10 MG TOTAL) BY MOUTH DAILY. (Patient taking differently: Take 10 mg by mouth daily. ) 90 tablet 2  . fenofibrate (TRICOR) 145 MG tablet Take 72.5 mg by mouth daily.     . fluticasone (FLOVENT HFA) 110 MCG/ACT inhaler Inhale 1 puff into the lungs 3 (three) times daily as needed (  for seasonal flares).    . hydrALAZINE (APRESOLINE) 25 MG tablet Take 25 mg by mouth See admin instructions. Take 25 mg by mouth two times a day and IF SYSTOLIC READING IS BELOW 110 ON TWO READINGS, STOP AND CALL DR. KELLY    . ipratropium (ATROVENT) 0.06 % nasal spray Can use two sprays in each nostril every six hours as needed to dry up nose. (Patient taking differently: Place 2 sprays into both nostrils every 6 (six) hours as needed for rhinitis. ) 15 mL 5  . isosorbide mononitrate (IMDUR) 60 MG 24 hr tablet Take 2 tablets (120 mg total) by mouth every morning AND 0.5 tablets (30 mg total) every evening. (Patient taking differently: Take 120 mg by mouth in the morning and 30 mg at bedtime) 225 tablet 3  . loratadine (CLARITIN) 10 MG tablet Take 10 mg by mouth every evening.     . Multiple Vitamins-Minerals (CENTRUM SILVER 50+MEN) TABS Take 1 tablet by mouth at bedtime.    . nitroGLYCERIN (NITROLINGUAL) 0.4 MG/SPRAY spray PLACE 1 SPRAY UNDER THE TONGUE EVERY 5 (FIVE) MINUTES X 3 DOSES AS NEEDED FOR CHEST PAIN. 1 g 2  . Omega-3 Fatty Acids (FISH OIL) 1000 MG CAPS Take 1,000 mg by mouth 2 (two) times daily.     . pantoprazole (PROTONIX) 40 MG tablet Take 1 tablet (40 mg total) by mouth 2 (two) times daily. (Patient taking differently: Take 40 mg by mouth at bedtime. ) 60 tablet 5  . ranolazine  (RANEXA) 1000 MG SR tablet Take 500 mg by mouth 2 (two) times daily.    [name] telmisartan (MICARDIS) 80 MG tablet Take 40 mg by mouth daily.  30 tablet 6  . torsemide (DEMADEX) 20 MG tablet Take 10 mg by mouth daily.  180 tablet 3  . vitamin C (ASCORBIC ACID) 500 MG tablet Take 500 mg by mouth at bedtime.     . vitamin E 400 UNIT capsule Take 400 Units by mouth daily.    [name] zinc gluconate 50 MG tablet Take 50 mg by mouth 2 (two) times a week.      Inpatient Medications    Scheduled Meds: . amiodarone  200 mg Oral QHS  . amLODipine  5 mg Oral QHS  . atorvastatin  20 mg Oral QHS  . ezetimibe  10 mg Oral Daily  . isosorbide mononitrate  120 mg Oral Daily  . pantoprazole  40 mg Oral Daily  . ranolazine  500 mg Oral BID  . sodium chloride flush  3 mL Intravenous Q12H    Continuous Infusions: . sodium chloride      PRN Meds: acetaminophen **OR** acetaminophen, albuterol, nitroGLYCERIN   ALLERGIES   Allergies  Allergen Reactions  . Altace [Ramipril] Swelling and Other (See Comments)    Mouth swelling  . Mucinex [Guaifenesin Er] Hives, Swelling and Other (See Comments)    Mouth swelling  . Contrast Media [Iodinated Diagnostic Agents] Other (See Comments)    Made eyes change each time    ROS   Pertinent items noted in HPI and remainder of comprehensive ROS otherwise negative.  Vitals   Vitals:   03/13/18 0349 03/13/18 0419 03/13/18 0433 03/13/18 0830  BP: 126/72 124/64 122/68 140/71  Pulse: 66 65 64 67  Resp: _0 Temp: 98.1 F (36.7 C) 97.6 F (36.4 C) 98 F (36.7 C) 98.4 F (36.9 C)  TempSrc: Oral Oral Oral Oral  SpO2: 98% 97% 98% 98%    Intake/Output  Summary (Last 24 hours) at 03/13/2018 1502 Last data filed at 03/13/2018 0830 Gross per 24 hour  Intake 657.5 ml  Output -  Net 657.5 ml   There were no vitals filed for this visit.  Physical Exam   General appearance: alert, no distress and pale Neck: no carotid bruit, no JVD and thyroid not enlarged,  symmetric, no tenderness/mass/nodules Lungs: clear to auscultation bilaterally Heart: regular rate and rhythm, S1, S2 normal and systolic murmur: systolic ejection 3/6, harsh at 2nd right intercostal space Abdomen: soft, non-tender; bowel sounds normal; no masses,  no organomegaly and mildly obese Extremities: extremities normal, atraumatic, no cyanosis or edema Pulses: 2+ and symmetric Skin: pale, warm, dry Neurologic: Grossly normal Psych: Pleasant  Labs   Results for orders placed or performed during the hospital encounter of 03/12/18 (from the past 48 hour(s))  Type and screen [place]     Status: None (Preliminary result)   Collection Time: 03/12/18  3:48 PM  Result Value Ref Range   ABO/RH(D) A POS    Antibody Screen NEG    Sample Expiration 03/15/2018    Unit Number [id number]    Blood Component Type RED CELLS,LR    Unit division 00    Status of Unit ISSUED,FINAL    Transfusion Status OK TO TRANSFUSE    Crossmatch Result      Compatible Performed at [facility], [address] , [place] [address]    Unit Number [id number]    Blood Component Type RED CELLS,LR    Unit division 00    Status of Unit ISSUED    Transfusion Status OK TO TRANSFUSE    Crossmatch Result Compatible   Basic metabolic panel     Status: Abnormal   Collection Time: 03/12/18  3:51 PM  Result Value Ref Range   Sodium 142 135 - 145 mmol/L   Potassium 3.7 3.5 - 5.1 mmol/L   Chloride 116 (H) 98 - 111 mmol/L   CO2 23 22 - 32 mmol/L   Glucose, Bld 136 (H) 70 - 99 mg/dL   BUN 41 (H) 8 - 23 mg/dL   Creatinine, Ser 2.18 (H) 0.61 - 1.24 mg/dL   Calcium 8.7 (L) 8.9 - 10.3 mg/dL   GFR calc non Af Amer 27 (L) >60 mL/min   GFR calc Af Amer 31 (L) >60 mL/min    Comment: (NOTE) The eGFR has been calculated using the CKD EPI equation. This calculation has not been validated in all clinical situations. eGFR's persistently <60 mL/min signify possible Chronic  Kidney Disease.    Anion gap 3 (L) 5 - 15    Comment: Performed at [facility], [place] [address], [place] [address]  CBC     Status: Abnormal   Collection Time: 03/12/18  3:51 PM  Result Value Ref Range   WBC 6.4 4.0 - 10.5 K/uL   RBC 2.20 (L) 4.22 - 5.81 MIL/uL   Hemoglobin 6.6 (LL) 13.0 - 17.0 g/dL    Comment: This critical result has verified and been called to J.BLUE,RN by Jacki Cones on 11 02 2019 at 1649, and has been read back.  This critical result has verified and been called to J.BLUE,RN by Jacki Cones on 11 02 2019 at 1650, and has been read back.  REPEATED TO VERIFY CORRECTED ON 11/02 AT 1954: PREVIOUSLY REPORTED AS 6.6 This critical result has verified and been called to J.BLUE,RN by Jacki Cones on 11 02 2019 at  1649, and has been read back.  This critical result has verified and been called to J.BLUE,RN by  Jacki Cones on 11 02 2019 at 1650, and has been read back.     HCT 22.3 (L) 39.0 - 52.0 %   MCV 101.4 (H) 80.0 - 100.0 fL   MCH 30.0 26.0 - 34.0 pg   MCHC 29.6 (L) 30.0 - 36.0 g/dL   RDW 15.7 (H) 11.5 - 15.5 %   Platelets 260 150 - 400 K/uL   nRBC 0.0 0.0 - 0.2 %    Comment: Performed at [facility] [address] [place], [place] [address]  I-stat troponin, ED     Status: None   Collection Time: 03/12/18  4:21 PM  Result Value Ref Range   Troponin i, poc 0.08 0.00 - 0.08 ng/mL   Comment NOTIFIED PHYSICIAN    Comment 3            Comment: Due to the release kinetics of cTnI, a negative result within the first hours of the onset of symptoms does not rule out myocardial infarction with certainty. If myocardial infarction is still suspected, repeat the test at appropriate intervals.   Prepare RBC     Status: None   Collection Time: 03/12/18  5:17 PM  Result Value Ref Range   Order Confirmation      ORDER PROCESSED BY BLOOD BANK Performed at [facility], [place] [address], [place], [place] [address]   Troponin I      Status: Abnormal   Collection Time: 03/13/18 12:21 AM  Result Value Ref Range   Troponin I 0.25 (HH) <0.03 ng/mL    Comment: CRITICAL RESULT CALLED TO, READ BACK BY AND VERIFIED WITH: GREEN,T RN 03/13/2018 0122 JORDANS Performed at [facility], [place] [address], [place], [place] [address]   Hemoglobin and hematocrit, blood     Status: Abnormal   Collection Time: 03/13/18 12:21 AM  Result Value Ref Range   Hemoglobin 7.3 (L) 13.0 - 17.0 g/dL   HCT 23.5 (L) 39.0 - 52.0 %    Comment: Performed at [facility] [address], [place], [place] [address]  Prepare RBC     Status: None   Collection Time: 03/13/18  3:32 AM  Result Value Ref Range   Order Confirmation      ORDER PROCESSED BY BLOOD BANK Performed at [facility], [place] [address] [place], [place] [address]   Troponin I     Status: Abnormal   Collection Time: 03/13/18 10:02 AM  Result Value Ref Range   Troponin I 2.49 (HH) <0.03 ng/mL    Comment: CRITICAL RESULT CALLED TO, READ BACK BY AND VERIFIED WITH: G.GLEASON,RN @ 1118 03/13/2018 WEBBERJ Performed at [facility], [place] [address], [place], [place] [address]   CBC     Status: Abnormal   Collection Time: 03/13/18 10:02 AM  Result Value Ref Range   WBC 6.5 4.0 - 10.5 K/uL   RBC 2.70 (L) 4.22 - 5.81 MIL/uL   Hemoglobin 8.2 (L) 13.0 - 17.0 g/dL   HCT 25.3 (L) 39.0 - 52.0 %   MCV 93.7 80.0 - 100.0 fL    Comment: POST TRANSFUSION SPECIMEN REPEATED TO VERIFY    MCH 30.4 26.0 - 34.0 pg   MCHC 32.4 30.0 - 36.0 g/dL   RDW 17.2 (H) 11.5 - 15.5 %   Platelets 211 150 - 400 K/uL   nRBC 0.0  0.0 - 0.2 %    Comment: Performed at [facility], [place] [address], [place], [place] [address]  Basic metabolic panel     Status: Abnormal   Collection Time: 03/13/18 10:02 AM  Result Value Ref Range   Sodium 144 135 - 145 mmol/L   Potassium 4.0 3.5 - 5.1 mmol/L   Chloride 117 (H) 98 - 111 mmol/L   CO2 21 (L) 22 - 32 mmol/L   Glucose, Bld 96 70 - 99 mg/dL    BUN 35 (H) 8 - 23 mg/dL   Creatinine, Ser 1.62 (H) 0.61 - 1.24 mg/dL   Calcium 8.3 (L) 8.9 - 10.3 mg/dL   GFR calc non Af Amer 39 (L) >60 mL/min   GFR calc Af Amer 45 (L) >60 mL/min    Comment: (NOTE) The eGFR has been calculated using the CKD EPI equation. This calculation has not been validated in all clinical situations. eGFR's persistently <60 mL/min signify possible Chronic Kidney Disease.    Anion gap 6 5 - 15    Comment: Performed at [facility] [address], [employer] [place], [place] [address]  Magnesium     Status: None   Collection Time: 03/13/18 10:02 AM  Result Value Ref Range   Magnesium 2.0 1.7 - 2.4 mg/dL    Comment: Performed at [facility] [address] [place], [place] [address]  Renal function panel     Status: Abnormal   Collection Time: 03/13/18 11:14 AM  Result Value Ref Range   Sodium 145 135 - 145 mmol/L   Potassium 3.9 3.5 - 5.1 mmol/L   Chloride 115 (H) 98 - 111 mmol/L   CO2 21 (L) 22 - 32 mmol/L   Glucose, Bld 92 70 - 99 mg/dL   BUN 34 (H) 8 - 23 mg/dL   Creatinine, Ser 1.59 (H) 0.61 - 1.24 mg/dL   Calcium 8.5 (L) 8.9 - 10.3 mg/dL   Phosphorus 3.0 2.5 - 4.6 mg/dL   Albumin 2.4 (L) 3.5 - 5.0 g/dL   GFR calc non Af Amer 40 (L) >60 mL/min   GFR calc Af Amer 46 (L) >60 mL/min    Comment: (NOTE) The eGFR has been calculated using the CKD EPI equation. This calculation has not been validated in all clinical situations. eGFR's persistently <60 mL/min signify possible Chronic Kidney Disease.    Anion gap 9 5 - 15    Comment: Performed at [facility] [address], [place], [place] [address]    ECG   Sinus rhythm at 64, trifascicular block - Personally Reviewed  Telemetry   Sinus rhythm - Personally Reviewed  Radiology   Dg Chest 2 View  Result Date: 03/12/2018 CLINICAL DATA:  Chest pain. EXAM: CHEST - 2 VIEW COMPARISON:  Chest x-ray dated March 02, 2018. FINDINGS: Stable cardiomegaly status post CABG. Normal pulmonary  vascularity. Atherosclerotic calcification of the aortic arch. No focal consolidation, pleural effusion, or pneumothorax. No acute osseous abnormality. External heart monitor noted. IMPRESSION: No active cardiopulmonary disease.  Stable cardiomegaly. Electronically Signed   By: Titus Dubin M.D.   On: 03/12/2018 16:50   US Renal  Result Date: 03/13/2018 CLINICAL DATA:  Acute kidney injury.  Chronic kidney disease. EXAM: RENAL / URINARY TRACT ULTRASOUND COMPLETE COMPARISON:  None. FINDINGS: Right Kidney: Renal measurements: 10.6 x 5 x 6.5 cm = volume: 177 mL. No renal mass. No hydronephrosis. Renal cortical thinning. Increased renal cortical echogenicity. Left Kidney: Renal measurements: 11.7 x  5 x 4.9 cm = volume: 152 mL. Two anechoic left renal masses measuring 2.2 x 1.7 x 2.3 cm and 2 x 1.6 x 1.6 cm respectively with increased through transmission most consistent with cysts. No hydronephrosis. Renal cortical thinning. Increased renal cortical echogenicity. Bladder: Appears normal for degree of bladder distention. IMPRESSION: 1. Increased renal cortical echogenicity and renal cortical thinning bilaterally as can be seen with chronic medical renal disease. 2. No obstructive uropathy. Electronically Signed   By: Kathreen Devoid   On: 03/13/2018 14:32    Cardiac Studies   N/A  Impression   1. Principal Problem: 2.   Acute blood loss anemia 3. Active Problems: 4.   Hyperlipidemia with target LDL less than 70 5.   Chronic renal insufficiency, stage III (moderate) (HCC) 6.   Essential hypertension 7.   Mild persistent asthma 8.   Paroxysmal atrial fibrillation (HCC) 9.   Chronic diastolic heart failure ([place]) 10.   Coronary artery disease with exertional angina ([place]) 11.   Recommendation   1. Mr. Norrington had chest pain in the setting of known CAD (managed medically) and acute lower GI bleed. He is now chest pain free after transfusion to HgB>8. I would try to keep him above this level. No  indication for acute intervention. Very concerning to have him anticoagulated given history of recurrent diverticular bleed, falls and intracranial bleed in the past. He is not a good candidate for long-term anticoagulation. ?If he would be a watchman candidate. Continue to hold Eliquis for now. Monitor rhythm for recurrent afib - continue Zio patch he is wearing. Echo recently shows stable AS - no need to repeat.  Thanks for the consult. Cardiology will follow with you.  Time Spent Directly with Patient:  I have spent a total of 45 minutes with the patient reviewing hospital notes, telemetry, EKGs, labs and examining the patient as well as establishing an assessment and plan that was discussed personally with the patient.  > 50% of time was spent in direct patient care.  Length of Stay:  LOS: 1 day   Pixie Casino, MD, [facility], [place] Director of the [facility] Lipid Disorders &  Cardiovascular Risk Reduction Clinic Diplomate of the American Board of Clinical Lipidology Attending Cardiologist  Direct Dial: [phone]  Fax: [phone]  Website:  [email or web address] 03/13/2018, 3:02 PM

## 2018-03-14 DIAGNOSIS — N179 Acute kidney failure, unspecified: Secondary | ICD-10-CM | POA: Diagnosis present

## 2018-03-14 DIAGNOSIS — Z888 Allergy status to other drugs, medicaments and biological substances status: Secondary | ICD-10-CM | POA: Diagnosis not present

## 2018-03-14 DIAGNOSIS — K5900 Constipation, unspecified: Secondary | ICD-10-CM | POA: Diagnosis present

## 2018-03-14 DIAGNOSIS — I350 Nonrheumatic aortic (valve) stenosis: Secondary | ICD-10-CM | POA: Diagnosis present

## 2018-03-14 DIAGNOSIS — Z91041 Radiographic dye allergy status: Secondary | ICD-10-CM | POA: Diagnosis not present

## 2018-03-14 DIAGNOSIS — Z955 Presence of coronary angioplasty implant and graft: Secondary | ICD-10-CM | POA: Diagnosis not present

## 2018-03-14 DIAGNOSIS — K649 Unspecified hemorrhoids: Secondary | ICD-10-CM | POA: Diagnosis present

## 2018-03-14 DIAGNOSIS — I21A1 Myocardial infarction type 2: Secondary | ICD-10-CM | POA: Diagnosis not present

## 2018-03-14 DIAGNOSIS — K922 Gastrointestinal hemorrhage, unspecified: Secondary | ICD-10-CM | POA: Diagnosis present

## 2018-03-14 DIAGNOSIS — K5731 Diverticulosis of large intestine without perforation or abscess with bleeding: Secondary | ICD-10-CM | POA: Diagnosis present

## 2018-03-14 DIAGNOSIS — G4733 Obstructive sleep apnea (adult) (pediatric): Secondary | ICD-10-CM | POA: Diagnosis present

## 2018-03-14 DIAGNOSIS — I25118 Atherosclerotic heart disease of native coronary artery with other forms of angina pectoris: Secondary | ICD-10-CM | POA: Diagnosis not present

## 2018-03-14 DIAGNOSIS — K219 Gastro-esophageal reflux disease without esophagitis: Secondary | ICD-10-CM | POA: Diagnosis present

## 2018-03-14 DIAGNOSIS — J4530 Mild persistent asthma, uncomplicated: Secondary | ICD-10-CM | POA: Diagnosis present

## 2018-03-14 DIAGNOSIS — I130 Hypertensive heart and chronic kidney disease with heart failure and stage 1 through stage 4 chronic kidney disease, or unspecified chronic kidney disease: Secondary | ICD-10-CM | POA: Diagnosis present

## 2018-03-14 DIAGNOSIS — N183 Chronic kidney disease, stage 3 (moderate): Secondary | ICD-10-CM | POA: Diagnosis present

## 2018-03-14 DIAGNOSIS — D649 Anemia, unspecified: Secondary | ICD-10-CM

## 2018-03-14 DIAGNOSIS — R42 Dizziness and giddiness: Secondary | ICD-10-CM | POA: Diagnosis present

## 2018-03-14 DIAGNOSIS — I5032 Chronic diastolic (congestive) heart failure: Secondary | ICD-10-CM

## 2018-03-14 DIAGNOSIS — E782 Mixed hyperlipidemia: Secondary | ICD-10-CM | POA: Diagnosis present

## 2018-03-14 DIAGNOSIS — Z951 Presence of aortocoronary bypass graft: Secondary | ICD-10-CM | POA: Diagnosis not present

## 2018-03-14 DIAGNOSIS — E785 Hyperlipidemia, unspecified: Secondary | ICD-10-CM

## 2018-03-14 DIAGNOSIS — I10 Essential (primary) hypertension: Secondary | ICD-10-CM

## 2018-03-14 DIAGNOSIS — I480 Paroxysmal atrial fibrillation: Secondary | ICD-10-CM | POA: Diagnosis present

## 2018-03-14 DIAGNOSIS — I4510 Unspecified right bundle-branch block: Secondary | ICD-10-CM | POA: Diagnosis present

## 2018-03-14 DIAGNOSIS — I452 Bifascicular block: Secondary | ICD-10-CM | POA: Diagnosis present

## 2018-03-14 DIAGNOSIS — R001 Bradycardia, unspecified: Secondary | ICD-10-CM | POA: Diagnosis present

## 2018-03-14 DIAGNOSIS — I25119 Atherosclerotic heart disease of native coronary artery with unspecified angina pectoris: Secondary | ICD-10-CM | POA: Diagnosis present

## 2018-03-14 DIAGNOSIS — D62 Acute posthemorrhagic anemia: Secondary | ICD-10-CM | POA: Diagnosis present

## 2018-03-14 LAB — CBC
HCT: 25.9 % — ABNORMAL LOW (ref 39.0–52.0)
Hemoglobin: 8 g/dL — ABNORMAL LOW (ref 13.0–17.0)
MCH: 28.8 pg (ref 26.0–34.0)
MCHC: 30.9 g/dL (ref 30.0–36.0)
MCV: 93.2 fL (ref 80.0–100.0)
NRBC: 0.3 % — AB (ref 0.0–0.2)
PLATELETS: 202 10*3/uL (ref 150–400)
RBC: 2.78 MIL/uL — ABNORMAL LOW (ref 4.22–5.81)
RDW: 18.4 % — ABNORMAL HIGH (ref 11.5–15.5)
WBC: 6.9 10*3/uL (ref 4.0–10.5)

## 2018-03-14 LAB — TYPE AND SCREEN
ABO/RH(D): A POS
Antibody Screen: NEGATIVE
Unit division: 0
Unit division: 0
Unit division: 0

## 2018-03-14 LAB — RENAL FUNCTION PANEL
Albumin: 2.2 g/dL — ABNORMAL LOW (ref 3.5–5.0)
Anion gap: 1 — ABNORMAL LOW (ref 5–15)
BUN: 35 mg/dL — ABNORMAL HIGH (ref 8–23)
CO2: 22 mmol/L (ref 22–32)
Calcium: 8.1 mg/dL — ABNORMAL LOW (ref 8.9–10.3)
Chloride: 118 mmol/L — ABNORMAL HIGH (ref 98–111)
Creatinine, Ser: 1.73 mg/dL — ABNORMAL HIGH (ref 0.61–1.24)
GFR calc Af Amer: 41 mL/min — ABNORMAL LOW (ref >60)
GFR calc non Af Amer: 36 mL/min — ABNORMAL LOW (ref >60)
Glucose, Bld: 96 mg/dL (ref 70–99)
Phosphorus: 3.9 mg/dL (ref 2.5–4.6)
Potassium: 3.9 mmol/L (ref 3.5–5.1)
Sodium: 141 mmol/L (ref 135–145)

## 2018-03-14 LAB — URINALYSIS, ROUTINE W REFLEX MICROSCOPIC
Bilirubin Urine: NEGATIVE
Glucose, UA: NEGATIVE mg/dL
Hgb urine dipstick: NEGATIVE
Ketones, ur: NEGATIVE mg/dL
Leukocytes, UA: NEGATIVE
Nitrite: NEGATIVE
Protein, ur: NEGATIVE mg/dL
Specific Gravity, Urine: 1.02 (ref 1.005–1.030)
pH: 5 (ref 5.0–8.0)

## 2018-03-14 LAB — BPAM RBC
BLOOD PRODUCT EXPIRATION DATE: 201911272359
Blood Product Expiration Date: 201911092359
Blood Product Expiration Date: 201911292359
ISSUE DATE / TIME: 201911021832
ISSUE DATE / TIME: 201911030412
ISSUE DATE / TIME: 201911032121
UNIT TYPE AND RH: 6200
Unit Type and Rh: 600
Unit Type and Rh: 6200

## 2018-03-14 LAB — PROTEIN, URINE, RANDOM: Total Protein, Urine: 7 mg/dL

## 2018-03-14 LAB — PROTIME-INR
INR: 1.41
PROTHROMBIN TIME: 17.1 s — AB (ref 11.4–15.2)

## 2018-03-14 LAB — CREATININE, URINE, RANDOM: Creatinine, Urine: 125.42 mg/dL

## 2018-03-14 LAB — ANTINUCLEAR ANTIBODIES, IFA: ANA Ab, IFA: NEGATIVE

## 2018-03-14 LAB — TROPONIN I: TROPONIN I: 1.46 ng/mL — AB (ref <0.03)

## 2018-03-14 LAB — SODIUM, URINE, RANDOM: Sodium, Ur: 40 mmol/L

## 2018-03-14 MED ORDER — ALBUTEROL SULFATE (2.5 MG/3ML) 0.083% IN NEBU: 3.0000 mL | INHALATION_SOLUTION | Freq: Four times a day (QID) | RESPIRATORY_TRACT | Status: DC | PRN

## 2018-03-14 NOTE — Progress Notes (Signed)
PROGRESS NOTE    Jorge Cherry  WUJ:[id number] DOB: 1935/10/21 DOA: 03/12/2018 PCP: Chilton Greathouse, MD  Outpatient Specialists:   Brief Narrative:  Jorge Cherry is a 82 year old male, with past medical history significant for CAD s/p CABG and PCI with DES,  at least moderate aortic valve stenosis with severely calcified aortic valve, paroxysmal atrial fibrillation on Eliquis, hypertension, CKD stage III, hyperlipidemia, prior GI bleeds, hemorrhoid and diverticulosis.  Patient's problems started about 2 days prior to presentation with constipation, GI bleed that was said to be initially maroon but it became bright red and chest pain.  Patient was admitted to wait hemoglobin of 6.6 g/dL.  Patient has been transfused with 2 units of packed red blood cells.  Last hemoglobin checked was about 10 hours ago, was 7.3 g/dL.  Repeat hemoglobin is pending (i.e. after transfusion of packed red blood cells).  Troponin was elevated at 0.25.  I have consulted the cardiology team, Dr. Rennis Golden.  I discussed personally with Dr. Rennis Golden.  Consulted the GI team, [facility].  [facility] office was called (phone #[phone]).  I have left patient's information with the GI office, and Dr. Levora Angel is on call for the GI team.  Further management will depend on hospital course.  03/14/2018: Cardiology input is appreciated.  GI input is appreciated.  Hemoglobin today is 8 g/dL. No further bleeding reported.  No chest pain since admission.  Troponin trend noted.  Cardiology input is appreciated.  We will continue supportive care for now.  Assessment & Plan:   Principal Problem:   Acute blood loss anemia Active Problems:   Hyperlipidemia with target LDL less than 70   Chronic renal insufficiency, stage III (moderate) (HCC)   Essential hypertension   Mild persistent asthma   Paroxysmal atrial fibrillation (HCC)   Chronic diastolic heart failure (HCC)   Coronary artery disease with exertional angina (HCC)   Acute blood  loss anemia: Patient with a 4 g drop in hemoglobin compared to 9 days ago, likely secondary to diverticular bleed.  He has a known history of diverticulosis and last had a colonoscopy in August 2013.  In March 2013 he had a large adenomatous polyp removed.  He is currently hemodynamically stable. -Holding Eliquis and aspirin -Typed and screened -Transfusing 1 unit PRBC, follow-up post transfusion H&H -Goal hemoglobin 8.0 given cardiac disease, transfuse as needed -Clear liquid diet now, n.p.o. at midnight -Consult GI in a.m.  03/13/2018: I have consulted cardiology team I spoke to Dr. Iona Coach.  Consulted GI team.  We will continue to monitor H/H.  We will continue to trend troponins.  Will transfuse packed red blood cells as deemed necessary.  03/14/2018: Kindly see above.  Hemoglobin is 8 g/dL.  Supportive care for now.  Cardiology input is highly appreciated.  Angina and history of CAD s/p CABG 1994 and PCI w/ DES 2005: Patient reporting angina at rest similar to his previous episodes.  Suspect this is secondary to poor perfusion in the setting of acute blood loss anemia. -Trend troponins -Holding aspirin as above -Continue home atorvastatin, Zetia, Imdur, and Ranexa -Metoprolol has been on hold since last admission due to bradycardia -Nitroglycerin as needed  03/13/2018: Cardiology team consulted.  Continue to trend troponin.  03/14/2018: Troponin trend noted.  No further chest pain reported.  Likely type II MI.  Acute on CKD stage III: Creatinine increased to 2.18 compared to 1.57 nine days ago.  Suspect secondary to poor perfusion from acute blood loss anemia. -  Holding home telmisartan and torsemide  03/13/2018: Worsening AKI.  Will work-up patient's AKI.  Will check urinalysis, urine sodium, urine protein, urine creatinine, ANA and renal ultrasound.  Will also repeat renal panel.  03/14/2018: AKI continues to improve.  Her serum creatinine today is 1.73.  Paroxysmal atrial  fibrillation: CHA2DS2-VASc Score 5. Currently in sinus rhythm, rate controlled. -Continue amiodarone -Holding Eliquis   Hypertension: Blood pressure stable to slightly elevated. -resume home amlodipine -hold hydralazine with intermittent hypotension  Chronic diastolic CHF: Currently stable, volume status looks okay.  Holding torsemide with AKI. -Strict I/O's, daily weights  Hyperlipidemia: -Continue atorvastatin and Zetia  Asthma: -Albuterol nebs as needed  DVT prophylaxis: SCDs  Code Status: Full code Family Communication: Discussed with wife, Demetric Dunnaway at bedside Disposition Plan: Pending resolution of GI bleed, stabilization of hemoglobin, and chest pain workup    Consultants:   Cardiology, Dr. Rennis Golden.  GI, Dr. Levora Angel.  Procedures:   None  Antimicrobials:   None   Subjective: No chest pain. No shortness of breath. No fever or chills.  Objective: Vitals:   03/14/18 0044 03/14/18 0500 03/14/18 0535 03/14/18 1503  BP: 130/66  134/72 114/69  Pulse: 66  65 72  Resp: 18   17  Temp: 98.4 F (36.9 C)  98 F (36.7 C) 98.3 F (36.8 C)  TempSrc: Axillary  Oral Oral  SpO2: 98%  98% 99%  Weight:  91.7 kg      Intake/Output Summary (Last 24 hours) at 03/14/2018 1610 Last data filed at 03/14/2018 0921 Gross per 24 hour  Intake 672 ml  Output 1000 ml  Net -328 ml   Filed Weights   03/14/18 0500  Weight: 91.7 kg    Examination:  General exam: Appears calm and comfortable.  Pale. Respiratory system: Clear to auscultation. Respiratory effort normal. Cardiovascular system: S1 & S2, with an ejection systolic murmur.  Bilateral leg edema.   Gastrointestinal system: Abdomen is nondistended, soft and nontender. No organomegaly or masses felt. Normal bowel sounds heard. Central nervous system: Alert and oriented. No focal neurological deficits. Extremities: Bilateral leg edema.   Psychiatry: Judgement and insight appear normal. Mood & affect  appropriate.     Data Reviewed: I have personally reviewed following labs and imaging studies  CBC: Recent Labs  Lab 03/12/18 1551 03/13/18 0021 03/13/18 1002 03/13/18 1809 03/14/18 0642  WBC 6.4  --  6.5  --  6.9  HGB 6.6* 7.3* 8.2* 8.0* 8.0*  HCT 22.3* 23.5* 25.3* 24.5* 25.9*  MCV 101.4*  --  93.7  --  93.2  PLT 260  --  211  --  202   Basic Metabolic Panel: Recent Labs  Lab 03/12/18 1551 03/13/18 1002 03/13/18 1114 03/14/18 0642  NA 142 144 145 141  K 3.7 4.0 3.9 3.9  CL 116* 117* 115* 118*  CO2 23 21* 21* 22  GLUCOSE 136* 96 92 96  BUN 41* 35* 34* 35*  CREATININE 2.18* 1.62* 1.59* 1.73*  CALCIUM 8.7* 8.3* 8.5* 8.1*  MG  --  2.0  --   --   PHOS  --   --  3.0 3.9   GFR: Estimated Creatinine Clearance: 38.1 mL/min (A) (by C-G formula based on SCr of 1.73 mg/dL (H)). Liver Function Tests: Recent Labs  Lab 03/13/18 1114 03/14/18 0642  ALBUMIN 2.4* 2.2*   No results for input(s): LIPASE, AMYLASE in the last 168 hours. No results for input(s): AMMONIA in the last 168 hours. Coagulation Profile: Recent Labs  Lab 03/14/18 0642  INR 1.41   Cardiac Enzymes: Recent Labs  Lab 03/13/18 0021 03/13/18 1002 03/13/18 1612 03/14/18 0642  TROPONINI 0.25* 2.49* 3.31* 1.46*   BNP (last 3 results) No results for input(s): PROBNP in the last 8760 hours. HbA1C: No results for input(s): HGBA1C in the last 72 hours. CBG: No results for input(s): GLUCAP in the last 168 hours. Lipid Profile: No results for input(s): CHOL, HDL, LDLCALC, TRIG, CHOLHDL, LDLDIRECT in the last 72 hours. Thyroid Function Tests: No results for input(s): TSH, T4TOTAL, FREET4, T3FREE, THYROIDAB in the last 72 hours. Anemia Panel: No results for input(s): VITAMINB12, FOLATE, FERRITIN, TIBC, IRON, RETICCTPCT in the last 72 hours. Urine analysis:    Component Value Date/Time   COLORURINE YELLOW 03/14/2018 1108   APPEARANCEUR CLEAR 03/14/2018 1108   LABSPEC 1.020 03/14/2018 1108   PHURINE  5.0 03/14/2018 1108   GLUCOSEU NEGATIVE 03/14/2018 1108   HGBUR NEGATIVE 03/14/2018 1108   BILIRUBINUR NEGATIVE 03/14/2018 1108   KETONESUR NEGATIVE 03/14/2018 1108   PROTEINUR NEGATIVE 03/14/2018 1108   NITRITE NEGATIVE 03/14/2018 1108   LEUKOCYTESUR NEGATIVE 03/14/2018 1108   Sepsis Labs: @LABRCNTIP (procalcitonin:4,lacticidven:4)  )No results found for this or any previous visit (from the past 240 hour(s)).       Radiology Studies: Dg Chest 2 View  Result Date: 03/12/2018 CLINICAL DATA:  Chest pain. EXAM: CHEST - 2 VIEW COMPARISON:  Chest x-ray dated March 02, 2018. FINDINGS: Stable cardiomegaly status post CABG. Normal pulmonary vascularity. Atherosclerotic calcification of the aortic arch. No focal consolidation, pleural effusion, or pneumothorax. No acute osseous abnormality. External heart monitor noted. IMPRESSION: No active cardiopulmonary disease.  Stable cardiomegaly. Electronically Signed   By: Obie Dredge M.D.   On: 03/12/2018 16:50   US Renal  Result Date: 03/13/2018 CLINICAL DATA:  Acute kidney injury.  Chronic kidney disease. EXAM: RENAL / URINARY TRACT ULTRASOUND COMPLETE COMPARISON:  None. FINDINGS: Right Kidney: Renal measurements: 10.6 x 5 x 6.5 cm = volume: 177 mL. No renal mass. No hydronephrosis. Renal cortical thinning. Increased renal cortical echogenicity. Left Kidney: Renal measurements: 11.7 x 5 x 4.9 cm = volume: 152 mL. Two anechoic left renal masses measuring 2.2 x 1.7 x 2.3 cm and 2 x 1.6 x 1.6 cm respectively with increased through transmission most consistent with cysts. No hydronephrosis. Renal cortical thinning. Increased renal cortical echogenicity. Bladder: Appears normal for degree of bladder distention. IMPRESSION: 1. Increased renal cortical echogenicity and renal cortical thinning bilaterally as can be seen with chronic medical renal disease. 2. No obstructive uropathy. Electronically Signed   By: Elige Ko   On: 03/13/2018 14:32         Scheduled Meds: . amiodarone  200 mg Oral QHS  . amLODipine  5 mg Oral QHS  . atorvastatin  20 mg Oral QHS  . ezetimibe  10 mg Oral Daily  . hydrocortisone  25 mg Rectal BID  . isosorbide mononitrate  120 mg Oral Daily  . pantoprazole  40 mg Oral Daily  . polyethylene glycol  17 g Oral Daily  . ranolazine  500 mg Oral BID  . sodium chloride flush  3 mL Intravenous Q12H   Continuous Infusions: . sodium chloride 10 mL/hr (03/14/18 0100)     LOS: 1 day    Time spent: 25 minutes.    Berton Mount, MD  [facility] Pager #: [phone] 7PM-7AM contact night coverage as above

## 2018-03-14 NOTE — Care Management Obs Status (Signed)
MEDICARE OBSERVATION STATUS NOTIFICATION   Patient Details  Name: Jorge Cherry MRN: [id number] Date of Birth: Jan 26, 1936   Medicare Observation Status Notification Given:  Yes(pt and wife informed @ bedside, however declined to sign)    Epifanio Lesches, RN 03/14/2018, 1:43 PM

## 2018-03-14 NOTE — Progress Notes (Signed)
GASTROENTEROLOGY PROGRESS NOTE  Problem:   Hematochezia.  Acute posthemorrhagic anemia.  Known extensive diverticulosis.  Chronic anticoagulation.  Subjective: No bleeding for approximately the past 15 hours.  Never had syncope with his episodes of hematochezia which occurred after passing a hard bowel movement.  No associated abdominal pain, either at time of bleeding or currently.  Hemoglobin was at his usual baseline of 10.7 (MCV 97, presumed anemia of chronic disease since he has chronic kidney disease) approximately 5 days before his bleeding, after which he dropped 4 g in hemoglobin.  Review of patient's office record shows that he has had 4 colonoscopies: In 1999, he had a 1 cm adenomatous polyp and an 8 mm polyp removed by Dr. Dorena Cookey; in 2008, a 6 mm adenoma was removed.  In 2013, a 14 mm flat adenoma was removed.  His most recent surveillance colonoscopy, in 2016, was negative for polyps but did show (as did the other exams) extensive sigmoid and left-sided diverticulosis.  Objective: Hemoglobin stable over past 20 hours, currently 8.0.  I believe he has had a total of 3 units of packed cells over the past 48 hours, including at least 1 unit yesterday.  Currently, the patient feels well.  Assessment: Most likely, this was a diverticular bleed which is currently quiescent.  The patient is on a daily 81 mg aspirin but his BUN did not significantly increase at time of bleeding, making an upper tract bleed unlikely.  Moreover, despite multiple episodes of frank hematochezia and a 4 g drop in hemoglobin, he was never syncopal or shocky, which would be unlikely if the bleeding had been of upper tract origin.  Conceivably, this could have been a hemorrhoidal bleed augmented by his chronic anticoagulation.  Plan: The most likely culprits for his bleeding (diverticulosis, and to a lesser degree, hemorrhoids) tend to be self-limited, especially with interruption of his anticoagulation.  I  discussed the option of upper endoscopy to confirm the absence of an upper tract source, colonoscopy or sigmoidoscopy to evaluate the lower GI tract, or observation.  At this time, given that his bleeding appears to have stopped, I would favor observation.  Given his age and underlying cardiac disease and the fact that he had a negative colonoscopy (apart from diverticulosis) within the past 3 years, I think the yield of doing a colonoscopy with the hope of finding a treatable lesion that accounts for his bleeding would be very low, and the risk would not be trivial.  I discussed all this with the patient and his wife at the bedside, and they are in full agreement.  If he has further bleeding during this hospitalization, we may have to alter our strategy.  If he does not have further bleeding, I would favor outpatient follow-up with monitoring of his Hemoccult status and hemoglobin level and depending on those findings, he may potentially require some sort of elective GI tract evaluation.  I am advancing the patient's diet.  Given the propensity for diverticular bleeding to recur after a day or 2 of quiescence, I would recommend that discharge not occur until the day after tomorrow, assuming he remains stable in the interim.  Jorge Cherry, M.D. 03/14/2018 12:31 PM  Pager [phone] If no answer or after 5 PM call [phone]

## 2018-03-14 NOTE — Progress Notes (Signed)
Progress Note  Patient Name: Jorge Cherry Date of Encounter: 03/14/2018  Primary Cardiologist: Tresa Endo  Subjective   No recurrent chest pain; back discomfort improved  Inpatient Medications    Scheduled Meds: . amiodarone  200 mg Oral QHS  . amLODipine  5 mg Oral QHS  . atorvastatin  20 mg Oral QHS  . ezetimibe  10 mg Oral Daily  . hydrocortisone  25 mg Rectal BID  . isosorbide mononitrate  120 mg Oral Daily  . pantoprazole  40 mg Oral Daily  . polyethylene glycol  17 g Oral Daily  . ranolazine  500 mg Oral BID  . sodium chloride flush  3 mL Intravenous Q12H   Continuous Infusions: . sodium chloride 10 mL/hr (03/14/18 0100)   PRN Meds: acetaminophen **OR** acetaminophen, albuterol, nitroGLYCERIN   Vital Signs    Vitals:   03/13/18 2155 03/14/18 0044 03/14/18 0500 03/14/18 0535  BP: 115/61 130/66  134/72  Pulse: 71 66  65  Resp:  18    Temp: 98.9 F (37.2 C) 98.4 F (36.9 C)  98 F (36.7 C)  TempSrc: Oral Axillary  Oral  SpO2: 97% 98%  98%  Weight:   91.7 kg     Intake/Output Summary (Last 24 hours) at 03/14/2018 1249 Last data filed at 03/14/2018 0981 Gross per 24 hour  Intake 672 ml  Output 1000 ml  Net -328 ml    I/O since admission: +329  Filed Weights   03/14/18 0500  Weight: 91.7 kg    Telemetry    sinus - Personally Reviewed  ECG    ECG (independently read by me): NSR at 64; Ist degree AVB; RBBB, LAHB  Physical Exam   BP 134/72 (BP Location: Left Arm)   Pulse 65   Temp 98 F (36.7 C) (Oral)   Resp 18   Wt 91.7 kg   SpO2 98%   BMI 30.74 kg/m  General: Alert, oriented, no distress.  Skin: normal turgor, no rashes, warm and dry HEENT: Normocephalic, atraumatic. Pupils equal round and reactive to light; sclera anicteric; extraocular muscles intact;  Nose without nasal septal hypertrophy Mouth/Parynx benign; Mallinpatti scale Neck: No JVD, no carotid bruits; normal carotid upstroke Lungs: clear to ausculatation and percussion; no  wheezing or rales Chest wall: without tenderness to palpitation Heart: PMI not displaced, RRR, s1 s2 normal, 1/6 systolic murmur, no diastolic murmur, no rubs, gallops, thrills, or heaves Abdomen: soft, nontender; no hepatosplenomehaly, BS+; abdominal aorta nontender and not dilated by palpation. Back: no CVA tenderness Pulses 2+ Musculoskeletal: full range of motion, normal strength, no joint deformities Extremities: no clubbing cyanosis or edema, Homan's sign negative  Neurologic: grossly nonfocal; Cranial nerves grossly wnl Psychologic: Normal mood and affect   Labs    Chemistry Recent Labs  Lab 03/13/18 1002 03/13/18 1114 03/14/18 0642  NA 144 145 141  K 4.0 3.9 3.9  CL 117* 115* 118*  CO2 21* 21* 22  GLUCOSE 96 92 96  BUN 35* 34* 35*  CREATININE 1.62* 1.59* 1.73*  CALCIUM 8.3* 8.5* 8.1*  ALBUMIN  --  2.4* 2.2*  GFRNONAA 39* 40* 36*  GFRAA 45* 46* 41*  ANIONGAP 6 9 1*     Hematology Recent Labs  Lab 03/12/18 1551  03/13/18 1002 03/13/18 1809 03/14/18 0642  WBC 6.4  --  6.5  --  6.9  RBC 2.20*  --  2.70*  --  2.78*  HGB 6.6*   < > 8.2* 8.0* 8.0*  HCT 22.3*   < >  25.3* 24.5* 25.9*  MCV 101.4*  --  93.7  --  93.2  MCH 30.0  --  30.4  --  28.8  MCHC 29.6*  --  32.4  --  30.9  RDW 15.7*  --  17.2*  --  18.4*  PLT 260  --  211  --  202   < > = values in this interval not displayed.    Cardiac Enzymes Recent Labs  Lab 03/13/18 0021 03/13/18 1002 03/13/18 1612 03/14/18 0642  TROPONINI 0.25* 2.49* 3.31* 1.46*    Recent Labs  Lab 03/12/18 1621  TROPIPOC 0.08     BNPNo results for input(s): BNP, PROBNP in the last 168 hours.   DDimer No results for input(s): DDIMER in the last 168 hours.   Lipid Panel     Component Value Date/Time   CHOL 106 07/01/2016 1345   CHOL 137 04/09/2014 1115   TRIG 120 07/01/2016 1345   TRIG 182 (H) 04/09/2014 1115   HDL 35 (L) 07/01/2016 1345   HDL 32 (L) 04/09/2014 1115   CHOLHDL 3.0 07/01/2016 1345   VLDL 24  07/01/2016 1345   LDLCALC 47 07/01/2016 1345   LDLCALC 69 04/09/2014 1115     Radiology    Dg Chest 2 View  Result Date: 03/12/2018 CLINICAL DATA:  Chest pain. EXAM: CHEST - 2 VIEW COMPARISON:  Chest x-ray dated March 02, 2018. FINDINGS: Stable cardiomegaly status post CABG. Normal pulmonary vascularity. Atherosclerotic calcification of the aortic arch. No focal consolidation, pleural effusion, or pneumothorax. No acute osseous abnormality. External heart monitor noted. IMPRESSION: No active cardiopulmonary disease.  Stable cardiomegaly. Electronically Signed   By: Obie Dredge M.D.   On: 03/12/2018 16:50   US Renal  Result Date: 03/13/2018 CLINICAL DATA:  Acute kidney injury.  Chronic kidney disease. EXAM: RENAL / URINARY TRACT ULTRASOUND COMPLETE COMPARISON:  None. FINDINGS: Right Kidney: Renal measurements: 10.6 x 5 x 6.5 cm = volume: 177 mL. No renal mass. No hydronephrosis. Renal cortical thinning. Increased renal cortical echogenicity. Left Kidney: Renal measurements: 11.7 x 5 x 4.9 cm = volume: 152 mL. Two anechoic left renal masses measuring 2.2 x 1.7 x 2.3 cm and 2 x 1.6 x 1.6 cm respectively with increased through transmission most consistent with cysts. No hydronephrosis. Renal cortical thinning. Increased renal cortical echogenicity. Bladder: Appears normal for degree of bladder distention. IMPRESSION: 1. Increased renal cortical echogenicity and renal cortical thinning bilaterally as can be seen with chronic medical renal disease. 2. No obstructive uropathy. Electronically Signed   By: Elige Ko   On: 03/13/2018 14:32    Cardiac Studies   ------------------------------------------------------------------- 03/03/2018 ECHO Study Conclusions  - Left ventricle: The cavity size was mildly dilated. There was   mild concentric hypertrophy. Systolic function was normal. The   estimated ejection fraction was in the range of 55% to 60%. Wall   motion was normal; there were no  regional wall motion   abnormalities. Doppler parameters are consistent with abnormal   left ventricular relaxation (grade 1 diastolic dysfunction). - Aortic valve: Trileaflet; severely calcified leaflets. There was   moderate stenosis. Peak velocity (S): 319 cm/s. Mean gradient   (S): 24 mm Hg. Peak gradient (S): 41 mm Hg. Calculated AVA likely   not accurate given suboptimal LVOT velocity measurements. - Mitral valve: There was mild regurgitation. - Left atrium: The atrium was moderately dilated. - Right ventricle: Systolic function was normal. - Pulmonary arteries: Systolic pressure was within the normal   range.  Impressions:  - No significant change compared to prior study in 2018.    Patient Profile     82 yo male patient of Dr. Tresa Endo with history of CABG times 07/27/1992, CKD 3, hypertension, dyslipidemia, aortic stenosis and PAF, who presents with chest pain and is found to be anemic in the setting of acute GI bleed.  Troponin is elevated and cardiology was consulted.  Assessment & Plan    1. GI bleed: probably diverticular in origen;  No further BRBPR; now off anticoagulation. AIm for HB > 8 or may need PRBC Tx with CAD.  2. CAD/ ho CABG: with chronic stage 2 exertional angina on medical therapy. No angina now than HB increased from admission.  3. Mild troponin elevation: suspect demand ischemia.  4. Moderate Aortic stenosis; mean gradient 24; PI 41, stable since 2018   5. PAF: currently in sinus with Zoll monitor on chest  5. OSA on CPAP  7. Essential HTN  8. Mixed Hyperlipidemia: on triple thearpy; most recent LDL 47  9. CKD: Cr increased to 1.73 today. Monitor closely.   Signed, Lennette Bihari, MD, [facility] 03/14/2018, 12:49 PM

## 2018-03-15 LAB — CBC
HEMATOCRIT: 24.9 % — AB (ref 39.0–52.0)
Hemoglobin: 7.9 g/dL — ABNORMAL LOW (ref 13.0–17.0)
MCH: 29.7 pg (ref 26.0–34.0)
MCHC: 31.7 g/dL (ref 30.0–36.0)
MCV: 93.6 fL (ref 80.0–100.0)
Platelets: 198 10*3/uL (ref 150–400)
RBC: 2.66 MIL/uL — ABNORMAL LOW (ref 4.22–5.81)
RDW: 18.4 % — AB (ref 11.5–15.5)
WBC: 6.1 10*3/uL (ref 4.0–10.5)
nRBC: 0 % (ref 0.0–0.2)

## 2018-03-15 NOTE — Progress Notes (Signed)
PROGRESS NOTE    Jorge CLAGG  ZOX:[id number] DOB: [birth year] DOA: 03/12/2018 PCP: Chilton Greathouse, MD  Outpatient Specialists:   Brief Narrative:  Jorge Cherry is a 82 year old male, with past medical history significant for CAD s/p CABG and PCI with DES,  at least moderate aortic valve stenosis with severely calcified aortic valve, paroxysmal atrial fibrillation on Eliquis, hypertension, CKD stage III, hyperlipidemia, prior GI bleeds, hemorrhoid and diverticulosis.  Patient's problems started about 2 days prior to presentation with constipation, GI bleed that was said to be initially maroon but it became bright red and chest pain.  Patient was admitted to wait hemoglobin of 6.6 g/dL.  Patient has been transfused with 2 units of packed red blood cells.  Last hemoglobin checked was about 10 hours ago, was 7.3 g/dL.  Repeat hemoglobin is pending (i.e. after transfusion of packed red blood cells).  Troponin was elevated at 0.25.  I have consulted the cardiology team, Dr. Rennis Golden.  I discussed personally with Dr. Rennis Golden.  Consulted the GI team, [facility].  [facility] was called (phone #[phone]).  I have left patient's information with the GI office, and Dr. Levora Angel is on call for the GI team.  Further management will depend on hospital course.  03/14/2018: Cardiology input is appreciated.  GI input is appreciated.  Hemoglobin today is 8 g/dL. No further bleeding reported.  No chest pain since admission.  Troponin trend noted.  Cardiology input is appreciated.  We will continue supportive care for now.  03/15/2018: Patient seen alongside patient's wife.  Hemoglobin today 7.9 g/dL.  No significant bleeding reported overnight.  Will monitor patient for today, and likely discharge patient back on tomorrow if patient remains stable.  Input from the GI team and cardiology team is highly appreciated.  Assessment & Plan:   Principal Problem:   Acute blood loss anemia Active Problems:  Hyperlipidemia with target LDL less than 70   Chronic renal insufficiency, stage III (moderate) (HCC)   Essential hypertension   Mild persistent asthma   Paroxysmal atrial fibrillation (HCC)   Chronic diastolic heart failure (HCC)   Coronary artery disease with exertional angina (HCC)   Acute blood loss anemia: Patient with a 4 g drop in hemoglobin compared to 9 days ago, likely secondary to diverticular bleed.  He has a known history of diverticulosis and last had a colonoscopy in August 2013.  In March 2013 he had a large adenomatous polyp removed.  He is currently hemodynamically stable. -Holding Eliquis and aspirin -Typed and screened -Transfusing 1 unit PRBC, follow-up post transfusion H&H -Goal hemoglobin 8.0 given cardiac disease, transfuse as needed -Clear liquid diet now, n.p.o. at midnight -Consult GI in a.m.  03/13/2018: I have consulted cardiology team I spoke to Dr. Iona Coach.  Consulted GI team.  We will continue to monitor H/H.  We will continue to trend troponins.  Will transfuse packed red blood cells as deemed necessary.  03/14/2018: Kindly see above.  Hemoglobin is 8 g/dL.  Supportive care for now.  Cardiology input is highly appreciated.  03/15/2018: Kindly see above.  Repeat H/H in the morning.  Likely DC home tomorrow if patient remains stable.  No further bleeding noted.  No angina symptoms reported.  Angina and history of CAD s/p CABG 1994 and PCI w/ DES 2005: Patient reporting angina at rest similar to his previous episodes.  Suspect this is secondary to poor perfusion in the setting of acute blood loss anemia. -Trend troponins -Holding aspirin as  above -Continue home atorvastatin, Zetia, Imdur, and Ranexa -Metoprolol has been on hold since last admission due to bradycardia -Nitroglycerin as needed  03/13/2018: Cardiology team consulted.  Continue to trend troponin.  03/14/2018: Troponin trend noted.  No further chest pain reported.  Likely type II MI.  Acute  on CKD stage III: Creatinine increased to 2.18 compared to 1.57 nine days ago.  Suspect secondary to poor perfusion from acute blood loss anemia. -Holding home telmisartan and torsemide  03/13/2018: Worsening AKI.  Will work-up patient's AKI.  Will check urinalysis, urine sodium, urine protein, urine creatinine, ANA and renal ultrasound.  Will also repeat renal panel.  03/14/2018: AKI continues to improve.  Her serum creatinine today is 1.73.  03/15/2018: Patient's serum creatinine is back to baseline.  Continue to monitor.  Paroxysmal atrial fibrillation: CHA2DS2-VASc Score 5. Currently in sinus rhythm, rate controlled. -Continue amiodarone -Holding Eliquis  03/15/2018: Kindly see GI input.  Hold aspirin and Eliquis for 1 week and then consider restarting.  Likely, this will be restarted when patient follows up with GI team in a week.  Hypertension: Blood pressure stable to slightly elevated. -resume home amlodipine -hold hydralazine with intermittent hypotension  03/15/2018: Continue to optimize.  Chronic diastolic CHF: Currently stable, volume status looks okay.  Holding torsemide with AKI. -Strict I/O's, daily weights  03/15/2018: Stable.  Hyperlipidemia: -Continue atorvastatin and Zetia  Asthma: -Albuterol nebs as needed  DVT prophylaxis: SCDs  Code Status: Full code Family Communication: Discussed with wife, Jorge Cherry at bedside Disposition Plan: Pending resolution of GI bleed, stabilization of hemoglobin, and chest pain workup    Consultants:   Cardiology, Dr. Rennis Golden.  GI, Dr. Levora Angel.  Procedures:   None  Antimicrobials:   None   Subjective: No chest pain. No shortness of breath. No fever or chills.  Objective: Vitals:   03/14/18 1503 03/14/18 2046 03/15/18 0415 03/15/18 1421  BP: 114/69 128/70 (!) 141/71 126/90  Pulse: 72 72 70 72  Resp: 17 16 18 17   Temp: 98.3 F (36.8 C) 98.1 F (36.7 C) 97.9 F (36.6 C) 98 F (36.7 C)  TempSrc:  Oral Oral Oral Oral  SpO2: 99% 94% 94% 99%  Weight:        Intake/Output Summary (Last 24 hours) at 03/15/2018 1717 Last data filed at 03/15/2018 1044 Gross per 24 hour  Intake 3 ml  Output -  Net 3 ml   Filed Weights   03/14/18 0500  Weight: 91.7 kg    Examination:  General exam: Appears calm and comfortable.  Pale. Respiratory system: Clear to auscultation. Respiratory effort normal. Cardiovascular system: S1 & S2, with an ejection systolic murmur.  Bilateral leg edema.   Gastrointestinal system: Abdomen is nondistended, soft and nontender. No organomegaly or masses felt. Normal bowel sounds heard. Central nervous system: Alert and oriented. No focal neurological deficits. Extremities: Bilateral leg edema.   Psychiatry: Judgement and insight appear normal. Mood & affect appropriate.     Data Reviewed: I have personally reviewed following labs and imaging studies  CBC: Recent Labs  Lab 03/12/18 1551 03/13/18 0021 03/13/18 1002 03/13/18 1809 03/14/18 0642 03/15/18 0305  WBC 6.4  --  6.5  --  6.9 6.1  HGB 6.6* 7.3* 8.2* 8.0* 8.0* 7.9*  HCT 22.3* 23.5* 25.3* 24.5* 25.9* 24.9*  MCV 101.4*  --  93.7  --  93.2 93.6  PLT 260  --  211  --  202 198   Basic Metabolic Panel: Recent Labs  Lab 03/12/18 1551  03/13/18 1002 03/13/18 1114 03/14/18 0642  NA 142 144 145 141  K 3.7 4.0 3.9 3.9  CL 116* 117* 115* 118*  CO2 23 21* 21* 22  GLUCOSE 136* 96 92 96  BUN 41* 35* 34* 35*  CREATININE 2.18* 1.62* 1.59* 1.73*  CALCIUM 8.7* 8.3* 8.5* 8.1*  MG  --  2.0  --   --   PHOS  --   --  3.0 3.9   GFR: Estimated Creatinine Clearance: 38.1 mL/min (A) (by C-G formula based on SCr of 1.73 mg/dL (H)). Liver Function Tests: Recent Labs  Lab 03/13/18 1114 03/14/18 0642  ALBUMIN 2.4* 2.2*   No results for input(s): LIPASE, AMYLASE in the last 168 hours. No results for input(s): AMMONIA in the last 168 hours. Coagulation Profile: Recent Labs  Lab 03/14/18 0642  INR 1.41    Cardiac Enzymes: Recent Labs  Lab 03/13/18 0021 03/13/18 1002 03/13/18 1612 03/14/18 0642  TROPONINI 0.25* 2.49* 3.31* 1.46*   BNP (last 3 results) No results for input(s): PROBNP in the last 8760 hours. HbA1C: No results for input(s): HGBA1C in the last 72 hours. CBG: No results for input(s): GLUCAP in the last 168 hours. Lipid Profile: No results for input(s): CHOL, HDL, LDLCALC, TRIG, CHOLHDL, LDLDIRECT in the last 72 hours. Thyroid Function Tests: No results for input(s): TSH, T4TOTAL, FREET4, T3FREE, THYROIDAB in the last 72 hours. Anemia Panel: No results for input(s): VITAMINB12, FOLATE, FERRITIN, TIBC, IRON, RETICCTPCT in the last 72 hours. Urine analysis:    Component Value Date/Time   COLORURINE YELLOW 03/14/2018 1108   APPEARANCEUR CLEAR 03/14/2018 1108   LABSPEC 1.020 03/14/2018 1108   PHURINE 5.0 03/14/2018 1108   GLUCOSEU NEGATIVE 03/14/2018 1108   HGBUR NEGATIVE 03/14/2018 1108   BILIRUBINUR NEGATIVE 03/14/2018 1108   KETONESUR NEGATIVE 03/14/2018 1108   PROTEINUR NEGATIVE 03/14/2018 1108   NITRITE NEGATIVE 03/14/2018 1108   LEUKOCYTESUR NEGATIVE 03/14/2018 1108   Sepsis Labs: @LABRCNTIP (procalcitonin:4,lacticidven:4)  )No results found for this or any previous visit (from the past 240 hour(s)).       Radiology Studies: No results found.      Scheduled Meds: . amiodarone  200 mg Oral QHS  . amLODipine  5 mg Oral QHS  . atorvastatin  20 mg Oral QHS  . ezetimibe  10 mg Oral Daily  . hydrocortisone  25 mg Rectal BID  . isosorbide mononitrate  120 mg Oral Daily  . pantoprazole  40 mg Oral Daily  . polyethylene glycol  17 g Oral Daily  . ranolazine  500 mg Oral BID  . sodium chloride flush  3 mL Intravenous Q12H   Continuous Infusions:    LOS: 2 days    Time spent: 25 minutes.    Berton Mount, MD  [facility] Pager #: [phone] 7PM-7AM contact night coverage as above

## 2018-03-15 NOTE — Progress Notes (Addendum)
Min passage of old bld--no active bleeding.  Hgb stable overnight.  OK for dischg from GI standpoint.  Recomm:  1. Hold Eliqus and ASA for 1 wk if ok w/ Cardiology, then should be ok to restart. 2. Chronic PPI prophylaxis (std dose of any PPI) IF pt is going to remain on both ASA and DOAC.  Have made pt f/u appt w/ me for 11/21 at 11 a.m.    Will plan to check hemoccult and hgb at that time.  Will sign off'; call if questions.  [name], M.D. Pager [phone] If no answer or after 5 PM call [phone]

## 2018-03-15 NOTE — Progress Notes (Signed)
Progress Note  Patient Name: Jorge Cherry Date of Encounter: 03/15/2018  Primary Cardiologist: Tresa Endo  Subjective   No recurrent chest pain; back discomfort improved; feels well  Inpatient Medications    Scheduled Meds: . amiodarone  200 mg Oral QHS  . amLODipine  5 mg Oral QHS  . atorvastatin  20 mg Oral QHS  . ezetimibe  10 mg Oral Daily  . hydrocortisone  25 mg Rectal BID  . isosorbide mononitrate  120 mg Oral Daily  . pantoprazole  40 mg Oral Daily  . polyethylene glycol  17 g Oral Daily  . ranolazine  500 mg Oral BID  . sodium chloride flush  3 mL Intravenous Q12H   Continuous Infusions:  PRN Meds: acetaminophen **OR** acetaminophen, albuterol, nitroGLYCERIN   Vital Signs    Vitals:   03/14/18 0535 03/14/18 1503 03/14/18 2046 03/15/18 0415  BP: 134/72 114/69 128/70 (!) 141/71  Pulse: 65 72 72 70  Resp:  17 16 18   Temp: 98 F (36.7 C) 98.3 F (36.8 C) 98.1 F (36.7 C) 97.9 F (36.6 C)  TempSrc: Oral Oral Oral Oral  SpO2: 98% 99% 94% 94%  Weight:        Intake/Output Summary (Last 24 hours) at 03/15/2018 1315 Last data filed at 03/15/2018 1044 Gross per 24 hour  Intake 3 ml  Output -  Net 3 ml    I/O since admission: +332  Filed Weights   03/14/18 0500  Weight: 91.7 kg    Telemetry    Sinus in the 70s- Personally Reviewed  ECG    ECG (independently read by me): NSR at 64; Ist degree AVB; RBBB, LAHB  Physical Exam   BP (!) 141/71 (BP Location: Left Arm)   Pulse 70   Temp 97.9 F (36.6 C) (Oral)   Resp 18   Wt 91.7 kg   SpO2 94%   BMI 30.74 kg/m  General: Alert, oriented, no distress.  Skin: normal turgor, no rashes, warm and dry HEENT: Normocephalic, atraumatic. Pupils equal round and reactive to light; sclera anicteric; extraocular muscles intact;  Nose without nasal septal hypertrophy Mouth/Parynx benign; Mallinpatti scale 3 Neck: No JVD, no carotid bruits; normal carotid upstroke Lungs: clear to ausculatation and percussion;  no wheezing or rales Chest wall: without tenderness to palpitation Heart: PMI not displaced, RRR, s1 s2 normal, 1/6 systolic murmur, no diastolic murmur, no rubs, gallops, thrills, or heaves Abdomen: soft, nontender; no hepatosplenomehaly, BS+; abdominal aorta nontender and not dilated by palpation. Back: no CVA tenderness Pulses 2+ Musculoskeletal: full range of motion, normal strength, no joint deformities Extremities: no clubbing cyanosis or edema, Homan's sign negative  Neurologic: grossly nonfocal; Cranial nerves grossly wnl Psychologic: Normal mood and affect   Labs    Chemistry Recent Labs  Lab 03/13/18 1002 03/13/18 1114 03/14/18 0642  NA 144 145 141  K 4.0 3.9 3.9  CL 117* 115* 118*  CO2 21* 21* 22  GLUCOSE 96 92 96  BUN 35* 34* 35*  CREATININE 1.62* 1.59* 1.73*  CALCIUM 8.3* 8.5* 8.1*  ALBUMIN  --  2.4* 2.2*  GFRNONAA 39* 40* 36*  GFRAA 45* 46* 41*  ANIONGAP 6 9 1*     Hematology Recent Labs  Lab 03/13/18 1002 03/13/18 1809 03/14/18 0642 03/15/18 0305  WBC 6.5  --  6.9 6.1  RBC 2.70*  --  2.78* 2.66*  HGB 8.2* 8.0* 8.0* 7.9*  HCT 25.3* 24.5* 25.9* 24.9*  MCV 93.7  --  93.2 93.6  MCH 30.4  --  28.8 29.7  MCHC 32.4  --  30.9 31.7  RDW 17.2*  --  18.4* 18.4*  PLT 211  --  202 198    Cardiac Enzymes Recent Labs  Lab 03/13/18 0021 03/13/18 1002 03/13/18 1612 03/14/18 0642  TROPONINI 0.25* 2.49* 3.31* 1.46*    Recent Labs  Lab 03/12/18 1621  TROPIPOC 0.08     BNPNo results for input(s): BNP, PROBNP in the last 168 hours.   DDimer No results for input(s): DDIMER in the last 168 hours.   Lipid Panel     Component Value Date/Time   CHOL 106 07/01/2016 1345   CHOL 137 04/09/2014 1115   TRIG 120 07/01/2016 1345   TRIG 182 (H) 04/09/2014 1115   HDL 35 (L) 07/01/2016 1345   HDL 32 (L) 04/09/2014 1115   CHOLHDL 3.0 07/01/2016 1345   VLDL 24 07/01/2016 1345   LDLCALC 47 07/01/2016 1345   LDLCALC 69 04/09/2014 1115     Radiology    US  Renal  Result Date: 03/13/2018 CLINICAL DATA:  Acute kidney injury.  Chronic kidney disease. EXAM: RENAL / URINARY TRACT ULTRASOUND COMPLETE COMPARISON:  None. FINDINGS: Right Kidney: Renal measurements: 10.6 x 5 x 6.5 cm = volume: 177 mL. No renal mass. No hydronephrosis. Renal cortical thinning. Increased renal cortical echogenicity. Left Kidney: Renal measurements: 11.7 x 5 x 4.9 cm = volume: 152 mL. Two anechoic left renal masses measuring 2.2 x 1.7 x 2.3 cm and 2 x 1.6 x 1.6 cm respectively with increased through transmission most consistent with cysts. No hydronephrosis. Renal cortical thinning. Increased renal cortical echogenicity. Bladder: Appears normal for degree of bladder distention. IMPRESSION: 1. Increased renal cortical echogenicity and renal cortical thinning bilaterally as can be seen with chronic medical renal disease. 2. No obstructive uropathy. Electronically Signed   By: Elige Ko   On: 03/13/2018 14:32    Cardiac Studies   ------------------------------------------------------------------- 03/03/2018 ECHO Study Conclusions  - Left ventricle: The cavity size was mildly dilated. There was   mild concentric hypertrophy. Systolic function was normal. The   estimated ejection fraction was in the range of 55% to 60%. Wall   motion was normal; there were no regional wall motion   abnormalities. Doppler parameters are consistent with abnormal   left ventricular relaxation (grade 1 diastolic dysfunction). - Aortic valve: Trileaflet; severely calcified leaflets. There was   moderate stenosis. Peak velocity (S): 319 cm/s. Mean gradient   (S): 24 mm Hg. Peak gradient (S): 41 mm Hg. Calculated AVA likely   not accurate given suboptimal LVOT velocity measurements. - Mitral valve: There was mild regurgitation. - Left atrium: The atrium was moderately dilated. - Right ventricle: Systolic function was normal. - Pulmonary arteries: Systolic pressure was within the normal    range.  Impressions:  - No significant change compared to prior study in 2018.    Patient Profile     82 yo male patient of Dr. Tresa Endo with history of CABG times 07/27/1992, CKD 3, hypertension, dyslipidemia, aortic stenosis and PAF, who presents with chest pain and is found to be anemic in the setting of acute GI bleed.  Troponin is elevated and cardiology was consulted.  Assessment & Plan    1. GI bleed: probably diverticular in origen;  No further BRBPR; now off anticoagulation. H/H today 7.9/24.9.   As per Dr. Matthias Hughs; will wait at least 1 week prior to initiating anticoagulation.  In light of GI bleed, will plan for  Eliquis and not start ASA at that time.   2. CAD/ ho CABG: with chronic stage 2 exertional angina on medical therapy. No angina now than HB increased from admission. Continue current anti-ischemic regimen.  3. Mild troponin elevation: suspect demand ischemia.  4. Moderate Aortic stenosis; mean gradient 24; PI 41, stable since 2018;  Will cancel outpatient scheduled echo since one just done in [place].  5. PAF: currently in sinus with Zoll monitor on chest  5. OSA on CPAP  7. Essential HTN  8. Mixed Hyperlipidemia: on triple thearpy; most recent LDL 47  9. CKD: Cr increased to 1.73 today. Monitor closely.   Signed, Lennette Bihari, MD, [facility] 03/15/2018, 1:15 PM

## 2018-03-16 LAB — CBC WITH DIFFERENTIAL/PLATELET
Abs Immature Granulocytes: 0.06 10*3/uL (ref 0.00–0.07)
Basophils Absolute: 0 10*3/uL (ref 0.0–0.1)
Basophils Relative: 0 %
Eosinophils Absolute: 0.1 10*3/uL (ref 0.0–0.5)
Eosinophils Relative: 2 %
HCT: 26.7 % — ABNORMAL LOW (ref 39.0–52.0)
Hemoglobin: 8.4 g/dL — ABNORMAL LOW (ref 13.0–17.0)
Immature Granulocytes: 1 %
Lymphocytes Relative: 10 %
Lymphs Abs: 0.6 10*3/uL — ABNORMAL LOW (ref 0.7–4.0)
MCH: 29.6 pg (ref 26.0–34.0)
MCHC: 31.5 g/dL (ref 30.0–36.0)
MCV: 94 fL (ref 80.0–100.0)
Monocytes Absolute: 0.7 10*3/uL (ref 0.1–1.0)
Monocytes Relative: 12 %
Neutro Abs: 4.6 10*3/uL (ref 1.7–7.7)
Neutrophils Relative %: 75 %
Platelets: 209 10*3/uL (ref 150–400)
RBC: 2.84 MIL/uL — ABNORMAL LOW (ref 4.22–5.81)
RDW: 17.5 % — ABNORMAL HIGH (ref 11.5–15.5)
WBC: 6.2 10*3/uL (ref 4.0–10.5)
nRBC: 0 % (ref 0.0–0.2)

## 2018-03-16 MED ORDER — DOCUSATE SODIUM 100 MG PO CAPS: 100.0000 mg | ORAL_CAPSULE | Freq: Two times a day (BID) | ORAL | 2 refills | Status: AC

## 2018-03-16 MED ORDER — ISOSORBIDE MONONITRATE ER 120 MG PO TB24: 120.0000 mg | ORAL_TABLET | Freq: Every day | ORAL | 0 refills | Status: DC

## 2018-03-16 MED ORDER — POLYETHYLENE GLYCOL 3350 17 G PO PACK: 17.0000 g | PACK | Freq: Every day | ORAL | 0 refills | Status: DC

## 2018-03-16 MED ORDER — PANTOPRAZOLE SODIUM 40 MG PO TBEC: 40.0000 mg | DELAYED_RELEASE_TABLET | Freq: Every day | ORAL | 0 refills | Status: DC

## 2018-03-16 MED ORDER — ATORVASTATIN CALCIUM 20 MG PO TABS: 20.0000 mg | ORAL_TABLET | Freq: Every day | ORAL | 0 refills | Status: AC

## 2018-03-16 MED ORDER — HYDROCORTISONE ACETATE 25 MG RE SUPP: 25.0000 mg | Freq: Two times a day (BID) | RECTAL | 0 refills | Status: DC

## 2018-03-16 NOTE — Care Management Important Message (Signed)
Important Message  Patient Details  Name: Jorge Cherry MRN: [id number] Date of Birth: 08-08-1935   Medicare Important Message Given:  Yes    [name] 03/16/2018, 2:46 PM

## 2018-03-16 NOTE — Progress Notes (Signed)
Nsg Discharge Note  Admit Date:  03/12/2018 Discharge date: 03/16/2018   Jorge Cherry to be D/C'd Home per MD order.  AVS completed.  Copy for chart, and copy for patient signed, and dated. Patient/caregiver able to verbalize understanding.  Discharge Medication: Allergies as of 03/16/2018      Reactions   Altace [ramipril] Swelling, Other (See Comments)   Mouth swelling   Mucinex [guaifenesin Er] Hives, Swelling, Other (See Comments)   Mouth swelling   Contrast Media [iodinated Diagnostic Agents] Other (See Comments)   Made eyes change each time      Medication List    STOP taking these medications   acetaminophen 500 MG tablet Commonly known as:  TYLENOL   aspirin EC 81 MG tablet   ELIQUIS 5 MG Tabs tablet Generic drug:  apixaban   hydrALAZINE 25 MG tablet Commonly known as:  APRESOLINE   MICARDIS 80 MG tablet Generic drug:  telmisartan   torsemide 20 MG tablet Commonly known as:  DEMADEX     TAKE these medications   albuterol 108 (90 Base) MCG/ACT inhaler Commonly known as:  PROVENTIL HFA;VENTOLIN HFA INHALE TWO PUFFS EVERY 4-6 HOURS AS NEEDED FOR COUGH OR WHEEZE What changed:    how much to take  how to take this  when to take this  reasons to take this  additional instructions   amiodarone 200 MG tablet Commonly known as:  PACERONE Take 1 tablet (200 mg total) by mouth daily. What changed:  when to take this   amLODipine 5 MG tablet Commonly known as:  NORVASC Take 5 mg twice a day What changed:    how much to take  how to take this  when to take this  additional instructions   atorvastatin 20 MG tablet Commonly known as:  LIPITOR Take 1 tablet (20 mg total) by mouth at bedtime. What changed:  how much to take   CENTRUM SILVER 50+MEN Tabs Take 1 tablet by mouth at bedtime.   docusate sodium 100 MG capsule Commonly known as:  COLACE Take 1 capsule (100 mg total) by mouth 2 (two) times daily.   DYMISTA 137-50 MCG/ACT Susp Generic  drug:  Azelastine-Fluticasone Place 1-2 sprays into the nose daily as needed (for seasonal allergies).   ezetimibe 10 MG tablet Commonly known as:  ZETIA TAKE 1 TABLET (10 MG TOTAL) BY MOUTH DAILY. What changed:  See the new instructions.   fenofibrate 145 MG tablet Commonly known as:  TRICOR Take 72.5 mg by mouth daily.   Fish Oil 1000 MG Caps Take 1,000 mg by mouth 2 (two) times daily.   fluticasone 110 MCG/ACT inhaler Commonly known as:  FLOVENT HFA Inhale 1 puff into the lungs 3 (three) times daily as needed (for seasonal flares).   hydrocortisone 25 MG suppository Commonly known as:  ANUSOL-HC Place 1 suppository (25 mg total) rectally 2 (two) times daily.   ipratropium 0.06 % nasal spray Commonly known as:  ATROVENT Can use two sprays in each nostril every six hours as needed to dry up nose. What changed:    how much to take  how to take this  when to take this  reasons to take this  additional instructions   isosorbide mononitrate 120 MG 24 hr tablet Commonly known as:  IMDUR Take 1 tablet (120 mg total) by mouth daily. Start taking on:  03/17/2018 What changed:    medication strength  See the new instructions.   loratadine 10 MG tablet Commonly known  as:  CLARITIN Take 10 mg by mouth every evening.   nitroGLYCERIN 0.4 MG/SPRAY spray Commonly known as:  NITROLINGUAL PLACE 1 SPRAY UNDER THE TONGUE EVERY 5 (FIVE) MINUTES X 3 DOSES AS NEEDED FOR CHEST PAIN.   pantoprazole 40 MG tablet Commonly known as:  PROTONIX Take 1 tablet (40 mg total) by mouth daily. Start taking on:  03/17/2018 What changed:  when to take this   polyethylene glycol packet Commonly known as:  MIRALAX / GLYCOLAX Take 17 g by mouth daily. Start taking on:  03/17/2018   RANEXA 1000 MG SR tablet Generic drug:  ranolazine Take 500 mg by mouth 2 (two) times daily.   vitamin C 500 MG tablet Commonly known as:  ASCORBIC ACID Take 500 mg by mouth at bedtime.   VITAMIN D-3  PO Take 1,000 Units by mouth every evening.   vitamin E 400 UNIT capsule Take 400 Units by mouth daily.   zinc gluconate 50 MG tablet Take 50 mg by mouth 2 (two) times a week.       Discharge Assessment: Vitals:   03/15/18 2144 03/16/18 0559  BP: 136/73 121/71  Pulse: 67 66  Resp: 18 17  Temp: 98.1 F (36.7 C) 98.8 F (37.1 C)  SpO2: 97% 95%   Skin clean, dry and intact without evidence of skin break down, no evidence of skin tears noted. IV catheter discontinued intact. Site without signs and symptoms of complications - no redness or edema noted at insertion site, patient denies c/o pain - only slight tenderness at site.  Dressing with slight pressure applied.  D/c Instructions-Education: Discharge instructions given to patient/family with verbalized understanding. D/c education completed with patient/family including follow up instructions, medication list, d/c activities limitations if indicated, with other d/c instructions as indicated by MD - patient able to verbalize understanding, all questions fully answered. Patient instructed to return to ED, call 911, or call MD for any changes in condition.  Patient escorted via WC, and D/C home via private auto.  Colbert Ewing, RN 03/16/2018 12:59 PM

## 2018-03-16 NOTE — Discharge Summary (Signed)
Physician Discharge Summary  Patient ID: CORAN DIPAOLA MRN: [id number] DOB/AGE: 1935/09/14 82 y.o.  Admit date: 03/12/2018 Discharge date: 03/16/2018  Admission Diagnoses:  Discharge Diagnoses:  Principal Problem:   Acute blood loss anemia   NSTEMI Active Problems:   Hyperlipidemia with target LDL less than 70   Chronic renal insufficiency, stage III (moderate) (HCC)   Essential hypertension   Mild persistent asthma   Paroxysmal atrial fibrillation (HCC)   Chronic diastolic heart failure (HCC)   Coronary artery disease with exertional angina [facility])   Discharged Condition: stable  Hospital Course:  Babatunde S Wellsis a 82 year old male, with past medical history significant for CADs/p CABG and PCI with DES, moderate aortic valve stenosis with severely calcified aortic valve, paroxysmal atrial fibrillation on Eliquis, hypertension,CKD stage III,hyperlipidemia, prior GI bleeds, hemorrhoid and diverticulosis.  Patient's problems started about 2 days prior to presentation with constipation, GI bleeding that was said to be initially maroon but became bright red,  and chest pain.  Patient was admitted for further assessment and management.  On admission to the hospital, patient's hemoglobin was 6.6 g/dL.  Patient was also noted to have elevated troponin.  Patient was transfused with a total of 3 units of packed red blood cells.  Patient's hemoglobin has remained stable around 8 g/dL.  Troponin was trended during the hospital stay, and peaked at 3.31.  GI team and cardiology team were consulted to assist with patient's management.  Aspirin and Eliquis were held on presentation.  Patient will follow with GI team, and the GI team will advise patient on when to restart aspirin and Eliquis.  Patient will also follow with the cardiology team on discharge.  Patient remained chest pain-free during the hospital stay.    Acute blood loss anemia: Patient presented with 4 g drop in hemoglobin compared to 9  days ago, likely secondary to diverticular bleed.  Patient has known diverticular disease.   Eliquis and aspirin were held. H/H was monitored. Patient was transfused with a total of 3 units of packed red blood cells. GI team was consulted to the assist in directing patient's care Patient's hemoglobin has been stable around 8 g/dL. Patient will follow with GI team on discharge.  Angina and history of CADs/p [id number] PCI w/ DES 2005: Patient reporting angina at rest similar to his previous episodes.  Suspect this is secondary to poor perfusion in the setting of acute blood loss anemia. Troponins were trended, and they were elevated.   Cardiology team was consulted. Patient has remained chest pain-free since admission.  Acute onCKD stage III: Creatinine increased to 2.18 compared to 1.57ninedays ago.  Suspect secondary to poor perfusion from acute blood loss anemia. Holding home telmisartan and torsemide Patient's serum creatinine is back to the baseline. Primary care provider will kindly continue to monitor renal function and electrolytes. Primary care provider and cardiology team will also advise patient on when to restart torsemide.  Paroxysmal atrial fibrillation: CHA2DS2-VASc Score5.Currently in sinus rhythm,rate controlled. -Continue amiodarone -Holding Eliquis  Hypertension: Continue to monitor.    Chronic diastolic CHF: Currently stable,volume status looks okay. Holding torsemide now.    Hyperlipidemia: -Continue atorvastatin and Zetia  Asthma: -Albuterol nebs as needed   Consults: cardiology and GI  Significant Diagnostic Studies:    Discharge Exam: Blood pressure 121/71, pulse 66, temperature 98.8 F (37.1 C), temperature source Oral, resp. rate 17, weight 91.7 kg, SpO2 95 %.   Disposition: Discharge disposition: 01-Home or Self Care   Discharge Instructions  Diet - low sodium heart healthy   Complete by:  As directed     Increase activity slowly   Complete by:  As directed      Allergies as of 03/16/2018      Reactions   Altace [ramipril] Swelling, Other (See Comments)   Mouth swelling   Mucinex [guaifenesin Er] Hives, Swelling, Other (See Comments)   Mouth swelling   Contrast Media [iodinated Diagnostic Agents] Other (See Comments)   Made eyes change each time      Medication List    STOP taking these medications   acetaminophen 500 MG tablet Commonly known as:  TYLENOL   aspirin EC 81 MG tablet   ELIQUIS 5 MG Tabs tablet Generic drug:  apixaban   hydrALAZINE 25 MG tablet Commonly known as:  APRESOLINE   MICARDIS 80 MG tablet Generic drug:  telmisartan   torsemide 20 MG tablet Commonly known as:  DEMADEX     TAKE these medications   albuterol 108 (90 Base) MCG/ACT inhaler Commonly known as:  PROVENTIL HFA;VENTOLIN HFA INHALE TWO PUFFS EVERY 4-6 HOURS AS NEEDED FOR COUGH OR WHEEZE What changed:    how much to take  how to take this  when to take this  reasons to take this  additional instructions   amiodarone 200 MG tablet Commonly known as:  PACERONE Take 1 tablet (200 mg total) by mouth daily. What changed:  when to take this   amLODipine 5 MG tablet Commonly known as:  NORVASC Take 5 mg twice a day What changed:    how much to take  how to take this  when to take this  additional instructions   atorvastatin 20 MG tablet Commonly known as:  LIPITOR Take 1 tablet (20 mg total) by mouth at bedtime. What changed:  how much to take   CENTRUM SILVER 50+MEN Tabs Take 1 tablet by mouth at bedtime.   docusate sodium 100 MG capsule Commonly known as:  COLACE Take 1 capsule (100 mg total) by mouth 2 (two) times daily.   DYMISTA 137-50 MCG/ACT Susp Generic drug:  Azelastine-Fluticasone Place 1-2 sprays into the nose daily as needed (for seasonal allergies).   ezetimibe 10 MG tablet Commonly known as:  ZETIA TAKE 1 TABLET (10 MG TOTAL) BY MOUTH DAILY. What  changed:  See the new instructions.   fenofibrate 145 MG tablet Commonly known as:  TRICOR Take 72.5 mg by mouth daily.   Fish Oil 1000 MG Caps Take 1,000 mg by mouth 2 (two) times daily.   fluticasone 110 MCG/ACT inhaler Commonly known as:  FLOVENT HFA Inhale 1 puff into the lungs 3 (three) times daily as needed (for seasonal flares).   hydrocortisone 25 MG suppository Commonly known as:  ANUSOL-HC Place 1 suppository (25 mg total) rectally 2 (two) times daily.   ipratropium 0.06 % nasal spray Commonly known as:  ATROVENT Can use two sprays in each nostril every six hours as needed to dry up nose. What changed:    how much to take  how to take this  when to take this  reasons to take this  additional instructions   isosorbide mononitrate 120 MG 24 hr tablet Commonly known as:  IMDUR Take 1 tablet (120 mg total) by mouth daily. Start taking on:  03/17/2018 What changed:    medication strength  See the new instructions.   loratadine 10 MG tablet Commonly known as:  CLARITIN Take 10 mg by mouth every evening.   nitroGLYCERIN  0.4 MG/SPRAY spray Commonly known as:  NITROLINGUAL PLACE 1 SPRAY UNDER THE TONGUE EVERY 5 (FIVE) MINUTES X 3 DOSES AS NEEDED FOR CHEST PAIN.   pantoprazole 40 MG tablet Commonly known as:  PROTONIX Take 1 tablet (40 mg total) by mouth daily. Start taking on:  03/17/2018 What changed:  when to take this   polyethylene glycol packet Commonly known as:  MIRALAX / GLYCOLAX Take 17 g by mouth daily. Start taking on:  03/17/2018   RANEXA 1000 MG SR tablet Generic drug:  ranolazine Take 500 mg by mouth 2 (two) times daily.   vitamin C 500 MG tablet Commonly known as:  ASCORBIC ACID Take 500 mg by mouth at bedtime.   VITAMIN D-3 PO Take 1,000 Units by mouth every evening.   vitamin E 400 UNIT capsule Take 400 Units by mouth daily.   zinc gluconate 50 MG tablet Take 50 mg by mouth 2 (two) times a week.      31 minutes spent  discharging patient.  SignedBarnetta Chapel 03/16/2018, 10:56 AM

## 2018-03-17 ENCOUNTER — Ambulatory Visit: Payer: Medicare Other

## 2018-03-18 ENCOUNTER — Telehealth: Payer: Self-pay | Admitting: Cardiovascular Disease

## 2018-03-18 NOTE — Telephone Encounter (Signed)
Left message to call back  

## 2018-03-18 NOTE — Telephone Encounter (Signed)
   Patient has questions about his BP since he left the hospital and they added new medications. His BP has run low and he has an appt today with Dr Johney Frame but wife wants to know if he should see Dr Tresa Endo. They want to know what to do about his BP and some of his medicaitons.

## 2018-03-21 NOTE — Telephone Encounter (Signed)
Patient was on amlodipine 5 mg, I would reinstitute 2.5 mg daily but not to take if his blood pressure is less than 110 systolic rather than what was told to him which was do not take any amlodipine unless his blood pressure was greater than 150 by GI.

## 2018-03-21 NOTE — Telephone Encounter (Signed)
Returned call to patient's wife.She stated husband saw Dr.Buccini's PA on Friday 03/18/18 was told to hold Amlodipine due to low B/P-116/64,87/48,78/60,108/68.PA advised to restart Eliquis Tue 03/22/18.Stated since he stopped amlodipine B/P ranging 144/78,143/78,131/71.Stated he was told only take amlodipine if systolic B/P greater than 150.Stated he had a echo done at [facility] 03/03/18.Echo scheduled at our office 04/14/18 was cancelled.Advised to keep monitoring B/P.Keep appointment as planned with [facility] 04/19/18 at 1:40 pm.Advised to call sooner if needed.I will make Dr.Kelly aware.

## 2018-03-22 MED ORDER — AMLODIPINE BESYLATE 5 MG PO TABS: 2.5000 mg | ORAL_TABLET | Freq: Every day | ORAL | Status: DC

## 2018-03-22 NOTE — Telephone Encounter (Signed)
Spoke to wife and patient-aware of Dr. Tresa EndoKelly recommendations and verbalized understanding.

## 2018-04-04 ENCOUNTER — Telehealth: Payer: Self-pay

## 2018-04-04 NOTE — Telephone Encounter (Signed)
   [facility] Pre-operative Risk Assessment    Request for surgical clearance:  1. What type of surgery is being performed? Colonoscopy/Endoscopy  2. When is this surgery scheduled? 05/26/2018   3. What type of clearance is required (medical clearance vs. Pharmacy clearance to hold med vs. Both)? Medical  4. Are there any medications that need to be held prior to surgery and how long? Can patient hold Eliquis the night before and morning of procedure?  5. Practice name and name of physician performing surgery? [facility]; Dr. Cristina Gong  6. What is your office phone number [phone]   7.   What is your office fax number [phone]  8.   Anesthesia type (None, local, MAC, general) ? Unknown   Jorge Cherry Adalena Abdulla 04/04/2018, 2:16 PM  _________________________________________________________________   (provider comments below)

## 2018-04-05 NOTE — Telephone Encounter (Signed)
   Primary Cardiologist:Thomas Tresa EndoKelly, MD  Chart reviewed as part of pre-operative protocol coverage.   The patient has an appointment with Dr. Tresa EndoKelly 04/19/18.    I will fwd this note to him so that he can address surgical clearance at the time of his visit on 04/19/18.  This note will be removed from the preop pool.  Tereso NewcomerScott Gladis Soley, PA-C  04/05/2018, 4:50 PM

## 2018-04-14 ENCOUNTER — Other Ambulatory Visit (HOSPITAL_COMMUNITY): Payer: Medicare Other

## 2018-04-19 ENCOUNTER — Telehealth: Payer: Self-pay | Admitting: *Deleted

## 2018-04-19 ENCOUNTER — Ambulatory Visit (INDEPENDENT_AMBULATORY_CARE_PROVIDER_SITE_OTHER): Payer: Medicare Other | Admitting: Cardiovascular Disease

## 2018-04-19 ENCOUNTER — Encounter: Payer: Self-pay | Admitting: Cardiovascular Disease

## 2018-04-19 VITALS — BP 84/62 | HR 71 | Ht 68.0 in | Wt 188.8 lb

## 2018-04-19 DIAGNOSIS — I25708 Atherosclerosis of coronary artery bypass graft(s), unspecified, with other forms of angina pectoris: Secondary | ICD-10-CM | POA: Diagnosis not present

## 2018-04-19 DIAGNOSIS — I480 Paroxysmal atrial fibrillation: Secondary | ICD-10-CM

## 2018-04-19 DIAGNOSIS — I10 Essential (primary) hypertension: Secondary | ICD-10-CM | POA: Diagnosis not present

## 2018-04-19 DIAGNOSIS — N183 Chronic kidney disease, stage 3 unspecified: Secondary | ICD-10-CM

## 2018-04-19 DIAGNOSIS — Z7901 Long term (current) use of anticoagulants: Secondary | ICD-10-CM

## 2018-04-19 DIAGNOSIS — Z951 Presence of aortocoronary bypass graft: Secondary | ICD-10-CM

## 2018-04-19 DIAGNOSIS — G4733 Obstructive sleep apnea (adult) (pediatric): Secondary | ICD-10-CM

## 2018-04-19 DIAGNOSIS — D500 Iron deficiency anemia secondary to blood loss (chronic): Secondary | ICD-10-CM

## 2018-04-19 NOTE — Progress Notes (Signed)
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 Normal sinus rhythm at 64 bpm.  LVH.  QTc interval 468 ms.  December 2016 ECG (independently read by me):  Sinus bradycardia 51 bpm.  No ectopy. QTc interval 429 ms.  LVH by voltage criteria in aVL.  September 2016 ECG (independently read by me):  Sinus bradycardia with first-degree AV block. RV conduction delay. No significant ST-T changes.   August 2016ECG (independently read by me): Sinus bradycardia 57 bpm.  Mild RV conduction delay.  May 2016 ECG (independently read by me): Sinus bradycardia at 49 bpm with first-degree AV block with a PR interval 218 ms.  Right reticular conduction delay.  December 2015 ECG (independently read by me).  For these: Sinus bradycardia 57 bpm.  First-degree AV block with a PR interval at 224 ms.  No significant ST-T changes.  March 2015 ECG (independently read by me): Sinus rhythm at 59 beats per minute. Mild LVH by voltage criteria in aVL. Normal intervals.  Prior 01/05/2013 ECG: Normal sinus rhythm at 62. Mild RV conduction delay. PR interval 204 ms, QTc interval 452 ms.  LABS: BMP Latest Ref Rng & Units 03/14/2018 03/13/2018 03/13/2018  Glucose 70 - 99 mg/dL 96 92 96  BUN 8 - 23 mg/dL 35(H) 34(H) 35(H)  Creatinine 0.61 - 1.24 mg/dL 1.73(H) 1.59(H) 1.62(H)  BUN/Creat Ratio 10 -  24 - - -  Sodium 135 - 145 mmol/L 141 145 144  Potassium 3.5 - 5.1 mmol/L 3.9 3.9 4.0  Chloride 98 - 111 mmol/L 118(H) 115(H) 117(H)  CO2 22 - 32 mmol/L 22 21(L) 21(L)  Calcium 8.9 - 10.3 mg/dL 8.1(L) 8.5(L) 8.3(L)   Hepatic Function Latest Ref Rng & Units 03/14/2018 03/13/2018 02/17/2017  Total Protein 6.0 - 8.5 g/dL - - 6.4  Albumin 3.5 - 5.0 g/dL 2.2(L) 2.4(L) 4.2  AST 0 - 40 IU/L - - 23  ALT 0 - 44 IU/L - - 16  Alk Phosphatase 39 - 117 IU/L - - 36(L)  Total Bilirubin 0.0 - 1.2 mg/dL - - 0.2  Bilirubin, Direct 0.1 - 0.5 mg/dL - - -   CBC Latest Ref Rng & Units 03/16/2018 03/15/2018 03/14/2018  WBC 4.0 - 10.5 K/uL 6.2 6.1 6.9  Hemoglobin 13.0 - 17.0 g/dL 8.4(L) 7.9(L) 8.0(L)  Hematocrit 39.0 - 52.0 % 26.7(L) 24.9(L) 25.9(L)  Platelets 150 - 400 K/uL 209 198 202   Lab Results  Component Value Date   MCV 94.0 03/16/2018   MCV 93.6 03/15/2018   MCV 93.2 03/14/2018   Lab Results  Component Value Date   TSH 2.32 07/01/2016  No results found for: HGBA1C   Lipid  Panel     Component Value Date/Time   CHOL 106 07/01/2016 1345   CHOL 137 04/09/2014 1115   TRIG 120 07/01/2016 1345   TRIG 182 (H) 04/09/2014 1115   HDL 35 (L) 07/01/2016 1345   HDL 32 (L) 04/09/2014 1115   CHOLHDL 3.0 07/01/2016 1345   VLDL 24 07/01/2016 1345   LDLCALC 47 07/01/2016 1345   LDLCALC 69 04/09/2014 1115     RADIOLOGY: No results found.  A CPAP download was obtained in the office today with reference to his obstructive sleep apnea from August 18 through January 24, 2018. He is 100% compliant with usage days and averaging 6 hours and 35 minutes of usage daily.  At his 12 cm pressure, AHI is excellent at 0.9.  He does have a mask leak.  Most recent CPAP November 10 through April 18, 2018: Compliance 97% usage days and usage greater than 4 hours.  Average use 6 hours 46 minutes.  At 12 cm pressure, AHI 1.2/h. IMPRESSION:  1. Coronary artery disease of bypass graft of native heart with stable  angina pectoris ([place])   2. Essential hypertension   3. PAF (paroxysmal atrial fibrillation) ([place])   4. Hx of CABG   5. OSA (obstructive sleep apnea)   6. Chronic anticoagulation   7. Blood loss anemia   8. Stage III chronic kidney disease ([place])     ASSESSMENT AND PLAN: Mr. Belcastro is an 82 year old white male who is status post CABG revascularization surgery in 1994.  He is status post DES stenting to the graft supplying the diagonal vessel in 2003.  In July 2017 he was hospitalized with atrial fibrillation in the setting of bradycardia and hypotension leading to medication adjustment  An echo Doppler study of 11/21/2015 showed an EF of 55-60%, mild aortic stenosis with a valve area of 1.75 mm per there was mild pulmonary hypertension. Subsequently he had complained of experiencing some episodes of chest tightness and his symptoms improved with further titration of nitrates as well as beta blocker therapy.  He developed significant anemia, which undoubtedly exacerbated his anginal symptomatology.  At his last catheterization in July 2018 although his LIMA to mid LAD and vein graft to diagonal vessel are patent the very proximal LAD had a 95% stenosis which supplies a diagonal vessel, which is not supplied by the grafts.  His native RCA and graft to RCA is occluded and there is some collateralization to the distal RCA via the vein graft supplying the circumflex marginal vessel.  His anginal symptoms have improved with stabilization of his hemoglobin.  Spironolactone was discontinued secondary to renal insufficiency.  Since his last catheterization he has undergone medication titration due to class II anginal symptomatology.  Presently, he has class II angina but is fairly well controlled on his medical regimen.  I reviewed his ER evaluation, hospitalization records, since his last office visit.  He had recently developed acute GI blood loss necessitating initial discontinuance of aspirin and Eliquis.  After  several weeks Eliquis was restarted and he is tolerating this well without significant recurrent awareness of any change in bowel.  Recent hemoglobin on March 28, 2018 was 11.3.  When he was hospitalized at [facility] on October 24 he underwent a repeat echo Doppler study which showed an EF of 55 to 60%.  He has moderate aortic stenosis with a mean gradient of 24 and peak gradient of 41.  He is now back on Eliquis but will stay off aspirin.  Cardiac wise  he is fairly stable and I have given him clearance to undergo planned colonoscopy and endoscopy which is tentatively scheduled for May 26, 2018.  He will need to hold Eliquis for minimum of 48 hours prior to the procedure.  I reviewed his most recent download.  He had received a new full facemask and had been on this new mask for the last 2 weeks prior to this download.  Despite the new mask there is still considerable mask leak.  AHI is excellent at 1.2/h at 12 cm water pressure.  I will change his mask to a ResMed AirFit N 30i.  I reviewed his cardiac monitor.  He was noted to have atrial fibrillation for 3 days duration and ultimately converted to sinus rhythm and had pause with termination suggesting prolonged sinus node recovery time.  He has stage III chronic kidney disease.  I will see him in 4 months for reevaluation or sooner if problems arise.  Time spent: 30 minutes Troy Sine, MD, [facility]  04/25/2018 7:36 PM

## 2018-04-19 NOTE — Telephone Encounter (Signed)
I saw the patient in the office today on April 19, 2018.  Okay to proceed with planned endoscopy and colonoscopy.  Hold Eliquis 48 hours prior to procedure.

## 2018-04-19 NOTE — Patient Instructions (Addendum)
Medication Instructions:  Your physician recommends that you continue on your current medications as directed. Please refer to the Current Medication list given to you today.  If you need a refill on your cardiac medications before your next appointment, please call your pharmacy.   Follow-Up: At [facility], you and your health needs are our priority.  As part of our continuing mission to provide you with exceptional heart care, we have created designated Provider Care Teams.  These Care Teams include your primary Cardiologist (physician) and Advanced Practice Providers (APPs -  Physician Assistants and Nurse Practitioners) who all work together to provide you with the care you need, when you need it. .   Any Other Special Instructions Will Be Listed Below (If Applicable). We will send orders to [facility] for a new mask  Hold Eliquis for 48 hours prior to colonoscopy/endoscopy We will notify Dr. Matthias HughsBuccini

## 2018-04-19 NOTE — Telephone Encounter (Signed)
-----   Message from Harvel RicksHayley A Sharpe, RN sent at 04/19/2018  3:02 PM EST ----- Please send orders for new mask. resmed N30i [facility]  Thanks!

## 2018-04-19 NOTE — Telephone Encounter (Signed)
Order for ResMed N-30-I mask sent to [facility] per Dr Tresa EndoKelly.

## 2018-04-25 ENCOUNTER — Encounter: Payer: Self-pay | Admitting: Cardiovascular Disease

## 2018-05-07 ENCOUNTER — Other Ambulatory Visit: Payer: Self-pay | Admitting: Cardiovascular Disease

## 2018-06-30 ENCOUNTER — Telehealth: Payer: Self-pay

## 2018-06-30 NOTE — Telephone Encounter (Signed)
Received fax from [facility], from Dr.Avva. Regarding the CBC, Ferritin.  Patient is not taking his iron tablets because it caused bleeding, diverticulitis and iron issues.  Per Dr.Avva, do not restart the iron tablets and recheck labs at next OV in April. Sent copy to [place] and [facility]. Will scan into chart.

## 2018-07-20 ENCOUNTER — Other Ambulatory Visit: Payer: Self-pay | Admitting: Cardiovascular Disease

## 2018-07-25 ENCOUNTER — Other Ambulatory Visit: Payer: Self-pay

## 2018-07-25 ENCOUNTER — Encounter: Payer: Self-pay | Admitting: Cardiovascular Disease

## 2018-07-25 ENCOUNTER — Ambulatory Visit: Payer: Medicare Other | Admitting: Cardiovascular Disease

## 2018-07-25 VITALS — BP 124/86 | HR 67 | Ht 68.0 in | Wt 191.0 lb

## 2018-07-25 DIAGNOSIS — I480 Paroxysmal atrial fibrillation: Secondary | ICD-10-CM

## 2018-07-25 DIAGNOSIS — D500 Iron deficiency anemia secondary to blood loss (chronic): Secondary | ICD-10-CM

## 2018-07-25 DIAGNOSIS — Z7901 Long term (current) use of anticoagulants: Secondary | ICD-10-CM

## 2018-07-25 DIAGNOSIS — G4733 Obstructive sleep apnea (adult) (pediatric): Secondary | ICD-10-CM | POA: Diagnosis not present

## 2018-07-25 DIAGNOSIS — I10 Essential (primary) hypertension: Secondary | ICD-10-CM

## 2018-07-25 DIAGNOSIS — I25708 Atherosclerosis of coronary artery bypass graft(s), unspecified, with other forms of angina pectoris: Secondary | ICD-10-CM | POA: Diagnosis not present

## 2018-07-25 MED ORDER — AMIODARONE HCL 200 MG PO TABS: 200.0000 mg | ORAL_TABLET | Freq: Every day | ORAL | 3 refills | Status: DC

## 2018-07-25 NOTE — Patient Instructions (Signed)
Medication Instructions:  The current medical regimen is effective;  continue present plan and medications.  If you need a refill on your cardiac medications before your next appointment, please call your pharmacy.    Follow-Up: At [facility], you and your health needs are our priority.  As part of our continuing mission to provide you with exceptional heart care, we have created designated Provider Care Teams.  These Care Teams include your primary Cardiologist (physician) and Advanced Practice Providers (APPs -  Physician Assistants and Nurse Practitioners) who all work together to provide you with the care you need, when you need it. You will need a follow up appointment in 6 months.  Please call our office 2 months in advance to schedule this appointment.  You may see  or one of the following Advanced Practice Providers on your designated Care Team: Azalee Course, [place] . Micah Flesher, PA-C

## 2018-07-25 NOTE — Progress Notes (Signed)
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 me): Normal sinus rhythm at 60 bpm.  LVH.  QTc interval at 464 ms.  Nonspecific T abnormality  December 2017 ECG (independently read by me):  Sinus bradycardia 57 bpm.  LVH by voltage. No significant ST changes.  January 10, 2016 ECG (independently read by me): Normal sinus rhythm at 64 bpm.  LVH.  QTc interval 468 ms.  December 2016 ECG (independently read by me):  Sinus bradycardia 51 bpm.  No ectopy. QTc interval 429 ms.  LVH by voltage criteria in aVL.  September 2016 ECG (independently read by me):  Sinus bradycardia with first-degree AV block. RV conduction delay. No significant ST-T changes.   August 2016 ECG (independently read by me): Sinus bradycardia 57 bpm.  Mild RV conduction delay.  May 2016 ECG (independently read by me): Sinus bradycardia at 49 bpm with first-degree AV block with a PR interval 218 ms.  Right reticular conduction delay.  December 2015 ECG (independently read by me).  For these: Sinus bradycardia 57 bpm.  First-degree AV block with a PR interval at 224 ms.  No significant ST-T changes.  March 2015 ECG (independently read by me): Sinus rhythm at 59 beats per minute. Mild LVH by voltage criteria in aVL. Normal intervals.  01/05/2013 ECG: Normal sinus rhythm at 62. Mild RV conduction delay. PR interval  204 ms, QTc interval 452 ms.  LABS: BMP Latest Ref Rng & Units 03/14/2018 03/13/2018 03/13/2018  Glucose 70 - 99 mg/dL 96 92 96  BUN 8 - 23 mg/dL 35(H) 34(H) 35(H)  Creatinine 0.61 - 1.24 mg/dL 1.73(H) 1.59(H) 1.62(H)  BUN/Creat Ratio 10 - 24 - - -  Sodium 135 - 145 mmol/L 141 145 144  Potassium 3.5 - 5.1 mmol/L 3.9 3.9 4.0  Chloride 98 - 111 mmol/L 118(H) 115(H) 117(H)  CO2 22 - 32 mmol/L 22 21(L) 21(L)  Calcium 8.9 - 10.3 mg/dL 8.1(L) 8.5(L) 8.3(L)   Hepatic Function Latest Ref Rng & Units 03/14/2018 03/13/2018 02/17/2017  Total Protein 6.0 - 8.5 g/dL - - 6.4  Albumin 3.5 - 5.0 g/dL 2.2(L) 2.4(L) 4.2  AST 0 - 40 IU/L - - 23  ALT 0 - 44 IU/L - - 16  Alk Phosphatase 39 - 117 IU/L - - 36(L)  Total Bilirubin 0.0 - 1.2 mg/dL - - 0.2  Bilirubin, Direct 0.1 - 0.5 mg/dL - - -   CBC Latest Ref Rng & Units 03/16/2018 03/15/2018 03/14/2018  WBC 4.0 - 10.5 K/uL 6.2 6.1 6.9  Hemoglobin 13.0 - 17.0 g/dL 8.4(L) 7.9(L) 8.0(L)  Hematocrit 39.0 - 52.0 % 26.7(L) 24.9(L) 25.9(L)  Platelets 150 - 400 K/uL 209 198 202   Lab Results  Component Value Date   MCV 94.0 03/16/2018   MCV 93.6 03/15/2018   MCV 93.2 03/14/2018   Lab Results  Component Value Date   TSH 2.32 07/01/2016  No results found for: HGBA1C   Lipid Panel     Component Value Date/Time   CHOL 106 07/01/2016 1345   CHOL 137 04/09/2014 1115   TRIG 120 07/01/2016 1345   TRIG 182 (H) 04/09/2014 1115   HDL 35 (L) 07/01/2016 1345   HDL 32 (L) 04/09/2014 1115   CHOLHDL 3.0 07/01/2016 1345   VLDL 24 07/01/2016 1345   LDLCALC 47 07/01/2016 1345   LDLCALC 69 04/09/2014 1115     RADIOLOGY: No results found.  A CPAP download was obtained in the office today with reference to his obstructive sleep apnea from August 18 through January 24, 2018. He is 100% compliant with usage days and averaging 6 hours and 35 minutes of usage daily.  At his 12 cm pressure, AHI is excellent at 0.9.  He does have a mask leak.  CPAP November 10 through  April 18, 2018: Compliance 97% usage days and usage greater than 4 hours.  Average use 6 hours 46 minutes.  At 12 cm pressure, AHI 1.2/h.  CPAP download June 25, 2018 through July 24, 2018.  Usage 87%, usage greater than 4 hours 83%, average usage on days used 6 hours and 34 minutes.  AHI 1.2 8012 cm pressure.  IMPRESSION:  1. Coronary artery disease of bypass graft of native heart with stable angina pectoris ([place])   2. Essential hypertension   3. OSA (obstructive sleep apnea) on CPAP   4. PAF (paroxysmal atrial fibrillation) ([place])   5. Chronic anticoagulation   6. Blood loss anemia     ASSESSMENT AND PLAN: Jorge Cherry is an 83 year old white male who is status post CABG revascularization surgery in 1994.  He is status post DES stenting to the graft supplying the diagonal vessel in 2003.  In July 2017 he was hospitalized with atrial fibrillation in the setting of bradycardia and hypotension leading to medication adjustment  An echo Doppler study of 11/21/2015 showed an EF of 55-60%, mild aortic stenosis with a valve area of 1.75 mm per there was mild pulmonary hypertension. Subsequently he had complained of experiencing some episodes of chest tightness and his symptoms improved with further titration of nitrates as well as beta blocker therapy.  He developed significant anemia, which undoubtedly exacerbated his anginal symptomatology.  At his last catheterization in July 2018 although his LIMA to mid LAD and vein graft to diagonal vessel are patent the very proximal LAD had a 95% stenosis which supplies a diagonal vessel, which is not supplied by the grafts.  His native RCA and graft to RCA is occluded and there is some collateralization to the distal RCA via the vein graft supplying the circumflex marginal vessel.  His anginal symptoms have improved with stabilization of his hemoglobin.  Spironolactone was discontinued secondary to renal insufficiency.  Since his last catheterization he has  undergone medication titration due to class II anginal symptomatology.  Presently, he has class II angina but is fairly well controlled on his medical regimen.  I reviewed his ER evaluation, hospitalization records, since his last office visit.  He had recently developed acute GI blood loss necessitating initial discontinuance of aspirin and Eliquis.  After several weeks Eliquis was restarted and he is tolerating this well without  significant recurrent awareness of any change in bowel.  Recent hemoglobin on March 28, 2018 was 11.3.  When hospitalized at [facility] on October 24 he underwent a repeat echo Doppler study which showed an EF of 55 to 60%, moderate aortic stenosis with a mean gradient of 24 and peak gradient of 41.  He is now back on Eliquis but will stay off aspirin.  He underwent his endoscopy and colonoscopy successfully.  He was told that he had diverticular disease but no other major findings.  His hemoglobin has stabilized.  There is no further bleeding.  Dr. Dagmar Hait is checked iron studies and will be reevaluating him on August 10, 2018.  Presently Jorge Cherry is without anginal symptoms on his current regimen.  His blood pressure today is controlled on amlodipine 5 mg, isosorbide, ranolazine.  He continues to be on atorvastatin, Zetia and fenofibrate in addition to omega-3 fatty acids for mixed hyperlipidemia.  He is maintaining sinus rhythm continues to be on amiodarone 200 mg daily.  Time spent: 25-minute Troy Sine, MD, [facility]  07/25/2018 6:14 PM

## 2018-07-26 ENCOUNTER — Telehealth: Payer: Self-pay | Admitting: Cardiovascular Disease

## 2018-07-26 MED ORDER — AMIODARONE HCL 200 MG PO TABS: 200.0000 mg | ORAL_TABLET | Freq: Every day | ORAL | 3 refills | Status: DC

## 2018-07-26 NOTE — Telephone Encounter (Signed)
Called patient, LVM advising medication was sent.  Left call back number if questions or concerns.

## 2018-07-26 NOTE — Telephone Encounter (Signed)
Patient's wife called saying script should have been sent for amiodarone (PACERONE) 200 MG tablet but it wasn't.  Please send [facility] to [id number] [place], [place] - [address]  Patient is out of meds, he wasn't able to take it this morning.   Please call to let them know when you send it.

## 2018-08-01 ENCOUNTER — Other Ambulatory Visit: Payer: Self-pay | Admitting: Cardiovascular Disease

## 2018-08-02 ENCOUNTER — Telehealth: Payer: Self-pay | Admitting: Cardiovascular Disease

## 2018-08-02 MED ORDER — AMIODARONE HCL 200 MG PO TABS: 200.0000 mg | ORAL_TABLET | Freq: Every day | ORAL | Status: DC

## 2018-08-02 NOTE — Telephone Encounter (Signed)
Returned the pt and pt wife call. Pt sts that he has been taking Amiodarone 200mg  daily. Confirmed with the pt that per Dr.Kelly's documentation of his 07/26/18 appt that is the correct instruction. Per Dr.Kelly "He is maintaining sinus rhythm continues to be on amiodarone 200 mg daily. "  Adv the pt that Amiodarone may have been refilled with previous instructions. I have update the pt medlist to reflect the correct dosage of 200mg  daily. Pt verbalized understanding and voiced appreciation for the call back.

## 2018-08-02 NOTE — Telephone Encounter (Signed)
Patient's wife called stating they just got a scripted filled for amiodarone (PACERONE) 200 MG tablet, she said the script hey just picked up has it for twice a day, he has been taking it for once a day. She wants to know how he should be taking it.

## 2018-09-05 ENCOUNTER — Other Ambulatory Visit: Payer: Self-pay | Admitting: Cardiovascular Disease

## 2018-09-05 NOTE — Telephone Encounter (Signed)
Pt is a 82 yom with a scr of 2.08(

## 2018-09-06 ENCOUNTER — Other Ambulatory Visit: Payer: Self-pay | Admitting: Pharmacist Clinician (PhC)/ Clinical Pharmacy Specialist

## 2018-09-06 MED ORDER — APIXABAN 2.5 MG PO TABS: 2.5000 mg | ORAL_TABLET | Freq: Two times a day (BID) | ORAL | 1 refills | Status: DC

## 2018-09-06 NOTE — Telephone Encounter (Signed)
Patient with SCr at 1.73, weight 86.6 kg and now has turned 82.  Reviewed information with patient and wife over the phone to explain new dose of Eliquis.  Patient has one dose of 5 mg remaining, will start the 2.5 mg tablets tomorrow.

## 2018-09-14 ENCOUNTER — Other Ambulatory Visit: Payer: Self-pay | Admitting: Cardiovascular Disease

## 2018-09-20 ENCOUNTER — Other Ambulatory Visit: Payer: Self-pay | Admitting: *Deleted

## 2018-09-20 MED ORDER — ISOSORBIDE MONONITRATE ER 60 MG PO TB24: ORAL_TABLET | ORAL | 11 refills | Status: DC

## 2018-09-20 NOTE — Telephone Encounter (Signed)
F/U Message           Patient is checking the status of his refill "Isosorbide-Mononit ER 120 mg" Patient is on his last pill need refill soon.

## 2018-09-20 NOTE — Telephone Encounter (Signed)
PT AWARE REFILL SENT BUT DR KELLY WANTED PT TO TAKE 90 MG IN AM AND 30 MG IN PM OF ISOSORBIDE .Jorge Cherry

## 2018-10-16 ENCOUNTER — Other Ambulatory Visit: Payer: Self-pay | Admitting: Cardiovascular Disease

## 2018-10-18 ENCOUNTER — Telehealth: Payer: Self-pay | Admitting: Cardiovascular Disease

## 2018-10-18 MED ORDER — AMLODIPINE BESYLATE 5 MG PO TABS: 2.5000 mg | ORAL_TABLET | Freq: Two times a day (BID) | ORAL | 1 refills | Status: DC

## 2018-10-18 NOTE — Telephone Encounter (Signed)
New Message     Pt c/o medication issue:  1. Name of Medication: Amlodipine   2. How are you currently taking this medication (dosage and times per day)? 5 mg 2 x daily   3. Are you having a reaction (difficulty breathing--STAT)? No   4. What is your medication issue? Pts wife is calling and says she has it written down that the pt takes a half tablet 2 x daily but the pharmacy has it as 5mg  2x daily   Please call

## 2018-10-18 NOTE — Telephone Encounter (Signed)
Called, LVM advising that per PharmD it should be Amlodipine 0.5 tablet twice daily. Updated med list-sent to pharmacy, Left call back number if questions.

## 2018-10-18 NOTE — Telephone Encounter (Signed)
Called patient and spoke with wife- she states that the RX that was sent yesterday was 5 mg twice daily-  But the last appointment with Dr.Kelly the med list states 0.5 tablet- can you please advise if you see anything I don't see.  Patient states when he did the whole tablet he became dizzy- but states since he changed to 0.5 tablet twice daily he has not had any issues and his blood pressure has been fine. They just don't know how it got changed. I do not see any recent telephone calls- but can you all advise?

## 2018-11-10 ENCOUNTER — Other Ambulatory Visit: Payer: Self-pay | Admitting: Cardiovascular Disease

## 2018-12-31 ENCOUNTER — Encounter (HOSPITAL_COMMUNITY): Payer: Self-pay

## 2018-12-31 ENCOUNTER — Other Ambulatory Visit: Payer: Self-pay

## 2018-12-31 ENCOUNTER — Ambulatory Visit (HOSPITAL_COMMUNITY)
Admission: EM | Admit: 2018-12-31 | Discharge: 2018-12-31 | Disposition: A | Discharge status: Home / Self Care | Payer: Medicare Other | Attending: Internal Medicine | Admitting: Internal Medicine

## 2018-12-31 DIAGNOSIS — R509 Fever, unspecified: Secondary | ICD-10-CM | POA: Diagnosis present

## 2018-12-31 DIAGNOSIS — N183 Chronic kidney disease, stage 3 (moderate): Secondary | ICD-10-CM | POA: Insufficient documentation

## 2018-12-31 DIAGNOSIS — E785 Hyperlipidemia, unspecified: Secondary | ICD-10-CM | POA: Insufficient documentation

## 2018-12-31 DIAGNOSIS — J4520 Mild intermittent asthma, uncomplicated: Secondary | ICD-10-CM | POA: Insufficient documentation

## 2018-12-31 DIAGNOSIS — I130 Hypertensive heart and chronic kidney disease with heart failure and stage 1 through stage 4 chronic kidney disease, or unspecified chronic kidney disease: Secondary | ICD-10-CM | POA: Insufficient documentation

## 2018-12-31 DIAGNOSIS — Z951 Presence of aortocoronary bypass graft: Secondary | ICD-10-CM | POA: Diagnosis not present

## 2018-12-31 DIAGNOSIS — Z7901 Long term (current) use of anticoagulants: Secondary | ICD-10-CM | POA: Diagnosis not present

## 2018-12-31 DIAGNOSIS — I480 Paroxysmal atrial fibrillation: Secondary | ICD-10-CM | POA: Insufficient documentation

## 2018-12-31 DIAGNOSIS — J4530 Mild persistent asthma, uncomplicated: Secondary | ICD-10-CM | POA: Insufficient documentation

## 2018-12-31 DIAGNOSIS — K219 Gastro-esophageal reflux disease without esophagitis: Secondary | ICD-10-CM | POA: Insufficient documentation

## 2018-12-31 DIAGNOSIS — I25119 Atherosclerotic heart disease of native coronary artery with unspecified angina pectoris: Secondary | ICD-10-CM | POA: Insufficient documentation

## 2018-12-31 DIAGNOSIS — Z955 Presence of coronary angioplasty implant and graft: Secondary | ICD-10-CM | POA: Insufficient documentation

## 2018-12-31 DIAGNOSIS — B349 Viral infection, unspecified: Secondary | ICD-10-CM | POA: Insufficient documentation

## 2018-12-31 DIAGNOSIS — M1990 Unspecified osteoarthritis, unspecified site: Secondary | ICD-10-CM | POA: Diagnosis not present

## 2018-12-31 DIAGNOSIS — R1110 Vomiting, unspecified: Secondary | ICD-10-CM | POA: Diagnosis not present

## 2018-12-31 DIAGNOSIS — E7800 Pure hypercholesterolemia, unspecified: Secondary | ICD-10-CM | POA: Diagnosis not present

## 2018-12-31 DIAGNOSIS — R0981 Nasal congestion: Secondary | ICD-10-CM | POA: Diagnosis not present

## 2018-12-31 DIAGNOSIS — Z20828 Contact with and (suspected) exposure to other viral communicable diseases: Secondary | ICD-10-CM | POA: Diagnosis not present

## 2018-12-31 DIAGNOSIS — Z79899 Other long term (current) drug therapy: Secondary | ICD-10-CM | POA: Insufficient documentation

## 2018-12-31 DIAGNOSIS — D649 Anemia, unspecified: Secondary | ICD-10-CM | POA: Insufficient documentation

## 2018-12-31 DIAGNOSIS — I5032 Chronic diastolic (congestive) heart failure: Secondary | ICD-10-CM | POA: Diagnosis not present

## 2018-12-31 NOTE — ED Notes (Signed)
Covid order was modified. I had already completed previous order. Pt should be charged for only 1 covid test. TB

## 2018-12-31 NOTE — ED Triage Notes (Signed)
Patient presents to Urgent Care with complaints of fever and vomiting since yesterday. Patient reports highest recorded fever was 100.5 at home.

## 2018-12-31 NOTE — Discharge Instructions (Signed)
We will contact you in 2 - 4 days if results are positive. Follow up with PCP if no improvement in symptoms. Go to ER with acutely worsening symptoms.

## 2018-12-31 NOTE — ED Provider Notes (Signed)
[facility]    CSN: [id number] Arrival date & time: 12/31/18  1319      History   Chief Complaint Chief Complaint  Patient presents with  . Fever  . Emesis    HPI Jorge Cherry is a 83 y.o. male.   Patient here requesting COVID19 swab.  He reports he has a "mild sinus infection" for the last 2 - 3 days.  Admits fever (tmax 100.5 1 day ago which spontaneously resolved), nasal congestion, and vomiting x 1 episode which he describes as "a very small amount of throw up".  Denies loss of taste/smell, coughing, wheezing, SOB.  Reports his son and daughter in law tested positive 3 weeks ago, and he saw them last 2 weeks ago.  Also notes he went to the store and the clerk had her mask around her chin and coughed.  No advil or tylenol today.     Past Medical History:  Diagnosis Date  . Arthritis    "just a touch in my hands" (11/24/2016)  . Atherosclerosis of renal artery (HCC)    RENAL DOPPLER, 12/10/2011 - Left renal artery demonstrated narrowing with elevated velocities consistent with a 1-59% diameter reduction  . CKD (chronic kidney disease) stage 3, GFR 30-59 ml/min (HCC)    "stable now since they backed off the water pills" (11/24/2016)  . Coronary artery disease    a. 1994 s/p CABG x 4 (LIMA-LAD, VG->D2, VG->OM, VG->RCA); b. 02/2004 PCI SVG-D2 (3.5x16 Taxus DES). VG->RCA 100. Sev apical LAD dzs distal to LIMA insertion; c. 11/2016 Cath: LAD 95p/12565m, D2 100ost, LCX 100ost, 70p/m, RCA 100p/m, RPDA fills via L->R collats. VG->RCA 100, VG->D2 20 ost, patent prox stent, 7866m, LIMA->LAD ok, VG->OM3 20p. EF 55%-->Med Rx.  . GERD (gastroesophageal reflux disease)   . High cholesterol   . History of lower GI bleeding    a. 09/2014 GIB due to diverticulosis/diverticulitis.  . Iron deficiency anemia   . Labile Hypertension   . Moderate aortic stenosis    a. 10/2011 Echo:  EF >55%, mild-mod TR, mild-mod AS, mod Ca2+ of AoV leaflets; b. 07/2016 Echo: EF 60-65%, no rwma, Gr1 DD, mod AS  [(S) mean grad 21mmHg, peak grad 34mmHg. Valve area (VTI): 1.33cm^2, (Vmax) 1.44cm^2. Mild MR]; c. 02/2018 Echo: EF 55-60%, no rwma, GR1 DD, Mod AS [Peak Vel (S): 319cm/s, Mean grad (S) 24mmHg, peak grad (S) 5841mmHg].  [name]. PAF (paroxysmal atrial fibrillation) (HCC)    a. CHA2DS2VASc = 4-->Eliquis.    Patient Active Problem List   Diagnosis Date Noted  . Acute blood loss anemia 03/12/2018  . Bradycardia 03/03/2018  . Chronic anticoagulation 12/14/2017  . Mild intermittent asthma without complication 07/22/2017  . LPRD (laryngopharyngeal reflux disease) 07/22/2017  . Anemia 11/25/2016  . Coronary artery disease with exertional angina (HCC) 11/24/2016  . Exertional angina (HCC)   . Chronic diastolic heart failure (HCC) 05/26/2016  . Junctional (nodal) bradycardia 11/20/2015  . Paroxysmal atrial fibrillation (HCC) 11/20/2015  . Hyperlipidemia LDL goal <70 05/03/2015  . Mild persistent asthma 03/19/2015  . Allergic rhinitis 03/19/2015  . Gastroesophageal reflux disease 03/19/2015  . Essential hypertension 02/07/2015  . Chest pain 01/22/2015  . Pain in the chest   . Chronic renal insufficiency, stage III (moderate) (HCC) 12/28/2014  . Lower GI bleed 09/24/2014  . Diverticulosis of colon with hemorrhage 09/24/2014  . Symptomatic anemia 09/24/2014  . Aortic valve stenosis 07/22/2013  . Hyperlipidemia with target LDL less than 70 10/27/2012  . Edema 10/27/2012  .  Hearing decreased 10/27/2012  . Hx of CABG 10/27/2012  . Coronary atherosclerosis of native coronary artery 10/27/2012  . Benign hypertensive heart disease without heart failure 10/27/2012    Past Surgical History:  Procedure Laterality Date  . CARDIAC CATHETERIZATION  02/27/2004   Coronary intervention and medical management  . CARDIAC CATHETERIZATION  11/24/2016  . CATARACT EXTRACTION W/ INTRAOCULAR LENS IMPLANT Left   . CORONARY ANGIOPLASTY  1994 X 2   "before bypass surgery"  . CORONARY ANGIOPLASTY WITH STENT PLACEMENT   03/04/2004   SVG supplying the diagonal vessel stented with a [id number] Taxus stent post dilated to 4.0 mm  . CORONARY ARTERY BYPASS GRAFT  1994   "CABG X4"  . INGUINAL HERNIA REPAIR Right   . RIGHT HEART CATH AND CORONARY/GRAFT ANGIOGRAPHY N/A 11/24/2016   Procedure: Right Heart Cath and Coronary/Graft Angiography;  Surgeon: Lennette BihariKelly, Thomas A, MD;  [facility];  Service: Cardiovascular;  Laterality: N/A;  . TONSILLECTOMY AND ADENOIDECTOMY         Home Medications    Prior to Admission medications   Medication Sig Start Date End Date Taking? Authorizing Provider  albuterol (PROAIR HFA) 108 (90 Base) MCG/ACT inhaler INHALE TWO PUFFS EVERY 4-6 HOURS AS NEEDED FOR COUGH OR WHEEZE Patient taking differently: Inhale 2 puffs into the lungs every 6 (six) hours as needed (for coughing or wheezing).  01/18/18   Kozlow, Alvira PhilipsEric J, MD  amiodarone (PACERONE) 200 MG tablet Take 1 tablet (200 mg total) by mouth daily. 08/02/18   Lennette BihariKelly, Thomas A, MD  amLODipine (NORVASC) 5 MG tablet Take 0.5 tablets (2.5 mg total) by mouth 2 (two) times daily. 10/18/18   Lennette BihariKelly, Thomas A, MD  apixaban (ELIQUIS) 2.5 MG TABS tablet Take 1 tablet (2.5 mg total) by mouth 2 (two) times daily. 09/06/18   Lennette BihariKelly, Thomas A, MD  atorvastatin (LIPITOR) 20 MG tablet Take 1 tablet (20 mg total) by mouth at bedtime. 03/16/18   Barnetta Chapelgbata, Sylvester I, MD  Azelastine-Fluticasone (DYMISTA) 137-50 MCG/ACT SUSP Place 1-2 sprays into the nose daily as needed (for seasonal allergies).    [provider]  docusate sodium (COLACE) 100 MG capsule Take 1 capsule (100 mg total) by mouth 2 (two) times daily. 03/16/18 03/16/19  Berton Mountgbata, Sylvester I, MD  ezetimibe (ZETIA) 10 MG tablet TAKE 1 TABLET (10 MG TOTAL) BY MOUTH DAILY. 08/01/18   Lennette BihariKelly, Thomas A, MD  fenofibrate (TRICOR) 145 MG tablet Take 72.5 mg by mouth daily.  12/10/15   [provider]  fluticasone (FLOVENT HFA) 110 MCG/ACT inhaler Inhale 1 puff into the lungs 3 (three)  times daily as needed (for seasonal flares).    [provider]  ipratropium (ATROVENT) 0.06 % nasal spray Can use two sprays in each nostril every six hours as needed to dry up nose. Patient taking differently: Place 2 sprays into both nostrils every 6 (six) hours as needed for rhinitis.  01/18/18   Kozlow, Alvira PhilipsEric J, MD  isosorbide mononitrate (IMDUR) 60 MG 24 hr tablet TAKE 90 MG IN AM AND 30 MG IN PM 09/20/18   Lennette BihariKelly, Thomas A, MD  loratadine (CLARITIN) 10 MG tablet Take 10 mg by mouth every evening.     [provider]  Multiple Vitamins-Minerals (CENTRUM SILVER 50+MEN) TABS Take 1 tablet by mouth at bedtime.    [provider]  nitroGLYCERIN (NITROLINGUAL) 0.4 MG/SPRAY spray PLACE 1 SPRAY UNDER THE TONGUE EVERY 5 (FIVE) MINUTES X 3 DOSES AS NEEDED FOR CHEST PAIN. 01/11/18  Kilroy, Luke K, PA-C  Omega-3 Fatty Acids (FISH OIL) 1000 MG CAPS Take 1,000 mg by mouth 2 (two) times daily.     [provider]  pantoprazole (PROTONIX) 40 MG tablet Take 1 tablet (40 mg total) by mouth daily. 03/17/18   Dana Allan I, MD  ranolazine (RANEXA) 500 MG 12 hr tablet TAKE 1 TABLET BY MOUTH TWICE A DAY 11/10/18   Troy Sine, MD  vitamin C (ASCORBIC ACID) 500 MG tablet Take 500 mg by mouth at bedtime.     [provider]  vitamin E 400 UNIT capsule Take 400 Units by mouth daily.    [provider]  zinc gluconate 50 MG tablet Take 50 mg by mouth 2 (two) times a week.    [provider]    Family History Family History  Problem Relation Age of Onset  . Cancer Mother   . Heart disease Father        died age 89  . Stroke Maternal Grandmother   . Cancer Maternal Grandfather     Social History Social History   Tobacco Use  . Smoking status: Never Smoker  . Smokeless tobacco: Never Used  Substance Use Topics  . Alcohol use: No    Alcohol/week: 0.0 standard drinks  . Drug use: No     Allergies   Altace [ramipril], Mucinex [guaifenesin  er], and Contrast media [iodinated diagnostic agents]   Review of Systems Review of Systems  Constitutional: Positive for fever (tmax 100.5). Negative for activity change, chills and fatigue.  HENT: Positive for congestion and sinus pressure. Negative for ear pain, postnasal drip, rhinorrhea, sinus pain and sore throat.   Eyes: Negative for pain and visual disturbance.  Respiratory: Negative for cough, chest tightness, shortness of breath and wheezing.   Cardiovascular: Negative for chest pain and palpitations.  Gastrointestinal: Positive for vomiting. Negative for abdominal pain, diarrhea and nausea.  Musculoskeletal: Negative for arthralgias and back pain.  Skin: Negative for color change and rash.  Neurological: Negative for light-headedness and headaches.  Hematological: Negative for adenopathy.  Psychiatric/Behavioral: Negative for agitation, confusion and sleep disturbance.  All other systems reviewed and are negative.    Physical Exam Triage Vital Signs ED Triage Vitals  Enc Vitals Group     BP 12/31/18 1340 120/78     Pulse Rate 12/31/18 1340 68     Resp 12/31/18 1340 17     Temp 12/31/18 1340 98.1 F (36.7 C)     Temp Source 12/31/18 1340 Tympanic     SpO2 12/31/18 1340 98 %     Weight --      Height --      Head Circumference --      Peak Flow --      Pain Score 12/31/18 1357 0     Pain Loc --      Pain Edu? --      Excl. in [place]? --    No data found.  Updated Vital Signs BP 120/78 (BP Location: Right Arm)   Pulse 68   Temp 98.1 F (36.7 C) (Tympanic)   Resp 17   SpO2 98%   Visual Acuity Right Eye Distance:   Left Eye Distance:   Bilateral Distance:    Right Eye Near:   Left Eye Near:    Bilateral Near:     Physical Exam Vitals signs and nursing note reviewed.  Constitutional:      General: He is not in acute distress.  Appearance: Normal appearance. He is well-developed. He is not ill-appearing or toxic-appearing.  HENT:     Head:  Normocephalic and atraumatic.     Right Ear: Hearing, tympanic membrane and ear canal normal. No middle ear effusion. Tympanic membrane is not retracted or bulging.     Left Ear: Hearing, tympanic membrane and ear canal normal.  No middle ear effusion. Tympanic membrane is not retracted or bulging.     Nose: No mucosal edema, congestion or rhinorrhea.     Right Sinus: No maxillary sinus tenderness or frontal sinus tenderness.     Left Sinus: No maxillary sinus tenderness or frontal sinus tenderness.     Mouth/Throat:     Lips: No lesions.     Mouth: Mucous membranes are moist.     Tongue: No lesions.     Pharynx: Oropharynx is clear. Uvula midline. No pharyngeal swelling, oropharyngeal exudate, posterior oropharyngeal erythema or uvula swelling.     Comments: Tonsils surgically absent. Eyes:     Conjunctiva/sclera: Conjunctivae normal.  Neck:     Musculoskeletal: Neck supple.  Cardiovascular:     Rate and Rhythm: Normal rate and regular rhythm.     Heart sounds: No murmur.  Pulmonary:     Effort: Pulmonary effort is normal. No respiratory distress.     Breath sounds: Normal breath sounds.  Abdominal:     Palpations: Abdomen is soft.     Tenderness: There is no abdominal tenderness.  Musculoskeletal: Normal range of motion.        General: No swelling or tenderness.  Skin:    General: Skin is warm and dry.     Capillary Refill: Capillary refill takes less than 2 seconds.  Neurological:     General: No focal deficit present.     Mental Status: He is alert and oriented to person, place, and time.     Cranial Nerves: No cranial nerve deficit.     Motor: No weakness.     Gait: Gait normal.  Psychiatric:        Mood and Affect: Mood normal.        Behavior: Behavior normal.      UC Treatments / Results  Labs (all labs ordered are listed, but only abnormal results are displayed) Labs Reviewed  NOVEL CORONAVIRUS, NAA (HOSPITAL ORDER, SEND-OUT TO REF LAB)    EKG    Radiology No results found.  Procedures Procedures (including critical care time)  Medications Ordered in UC Medications - No data to display  Initial Impression / Assessment and Plan / UC Course  I have reviewed the triage vital signs and the nursing notes.  Pertinent labs & imaging results that were available during my care of the patient were reviewed by me and considered in my medical decision making (see chart for details).     Follow up with PCP We will send COVID swab to lab, results usually back within 24 to 48 hours Remain in self isolation per CDC guidelines Go to ER with any acutely worsening symptoms. Final Clinical Impressions(s) / UC Diagnoses   Final diagnoses:  Viral illness     Discharge Instructions     We will contact you in 2 - 4 days if results are positive. Follow up with PCP if no improvement in symptoms. Go to ER with acutely worsening symptoms.   ED Prescriptions    None     Controlled Substance Prescriptions [place] Controlled Substance Registry consulted? Not Applicable   Evern CoreLindquist, Deontez Klinke, Cordelia Poche-C 12/31/18  1429  

## 2019-01-01 LAB — NOVEL CORONAVIRUS, NAA (HOSP ORDER, SEND-OUT TO REF LAB; TAT 18-24 HRS): SARS-CoV-2, NAA: NOT DETECTED

## 2019-01-10 DIAGNOSIS — E032 Hypothyroidism due to medicaments and other exogenous substances: Secondary | ICD-10-CM | POA: Diagnosis present

## 2019-01-31 ENCOUNTER — Other Ambulatory Visit: Payer: Self-pay | Admitting: Cardiovascular Disease

## 2019-02-01 NOTE — Telephone Encounter (Signed)
Rx(s) sent to pharmacy electronically. Per last note, patient was taking half-tab fenofibrate 145mg  QD

## 2019-02-01 NOTE — Telephone Encounter (Signed)
Can you all please advise?  i'm not sure if he should be on this or not per Dr.Kellys note.  It seems it was last filled in 2017?

## 2019-02-02 ENCOUNTER — Other Ambulatory Visit: Payer: Self-pay

## 2019-02-02 MED ORDER — PANTOPRAZOLE SODIUM 40 MG PO TBEC: 40.0000 mg | DELAYED_RELEASE_TABLET | Freq: Every day | ORAL | 0 refills | Status: DC

## 2019-02-02 NOTE — Telephone Encounter (Signed)
Patients daughter called to get a refill on the patients Protonix.  Patient is scheduled to see Dr Neldon Mc 10-20.  [facility] [address]   Please Advise.

## 2019-02-02 NOTE — Telephone Encounter (Signed)
Med sent.

## 2019-02-28 ENCOUNTER — Other Ambulatory Visit: Payer: Self-pay | Admitting: Cardiovascular Disease

## 2019-02-28 ENCOUNTER — Encounter: Payer: Self-pay | Admitting: Allergy and Immunology

## 2019-02-28 ENCOUNTER — Ambulatory Visit: Payer: Medicare Other | Admitting: Allergy and Immunology

## 2019-02-28 ENCOUNTER — Other Ambulatory Visit: Payer: Self-pay

## 2019-02-28 VITALS — BP 138/78 | HR 68 | Temp 97.9°F | Resp 18 | Ht 67.7 in | Wt 190.8 lb

## 2019-02-28 DIAGNOSIS — J4520 Mild intermittent asthma, uncomplicated: Secondary | ICD-10-CM

## 2019-02-28 DIAGNOSIS — J3089 Other allergic rhinitis: Secondary | ICD-10-CM | POA: Diagnosis not present

## 2019-02-28 DIAGNOSIS — K219 Gastro-esophageal reflux disease without esophagitis: Secondary | ICD-10-CM

## 2019-02-28 MED ORDER — IPRATROPIUM BROMIDE 0.06 % NA SOLN: NASAL | 5 refills | Status: DC

## 2019-02-28 MED ORDER — BUDESONIDE 32 MCG/ACT NA SUSP: NASAL | 5 refills | Status: DC

## 2019-02-28 MED ORDER — PANTOPRAZOLE SODIUM 40 MG PO TBEC: 40.0000 mg | DELAYED_RELEASE_TABLET | Freq: Every day | ORAL | 5 refills | Status: DC

## 2019-02-28 MED ORDER — ALBUTEROL SULFATE HFA 108 (90 BASE) MCG/ACT IN AERS: INHALATION_SPRAY | RESPIRATORY_TRACT | 5 refills | Status: DC

## 2019-02-28 NOTE — Progress Notes (Signed)
[place] [facility] [place] [facility] [place] [facility] [place] [facility] [place]   Follow-up Note  Referring Provider: Chilton Greathouse, MD Primary Provider: Chilton Greathouse, MD Date of Office Visit: 02/28/2019  Subjective:   Jorge Cherry (DOB: November 07, 1935) is a 83 y.o. male who [facility] in re-evaluation of the following:  HPI: Jorge Cherry returns to this clinic in evaluation of allergic rhinitis and asthma and reflux with LPR.  His last visit to this clinic was 18 January 2018.  Overall he has done pretty well with his nose although he has had some nasal congestion.  He has some intermittent rhinorrhea as well which responds quite well to intermittent use of nasal ipratropium about 3 times per week.  He stopped using a nasal steroid because he has had problems in the distant past with bleeding from using these medications.  He has not had an issue with his lower airway in over 2 years and has not used albuterol during that timeframe.  His reflux is under very good control at this point in time on a proton pump inhibitor.  He is having problems with his hearing and he is having problems with his audiologist.  His last visit with his audiologist to address the issue with his hearing was 6 months ago but he was not particularly satisfied with the response he received from that visit.  He did receive the flu vaccine.  Allergies as of 02/28/2019      Reactions   Altace [ramipril] Swelling, Other (See Comments)   Mouth swelling   Mucinex [guaifenesin Er] Hives, Swelling, Other (See Comments)   Mouth swelling   Contrast Media [iodinated Diagnostic Agents] Other (See Comments)   Made eyes change each time      Medication List      albuterol 108 (90 Base) MCG/ACT inhaler Commonly known as: ProAir HFA INHALE TWO PUFFS EVERY 4-6 HOURS AS NEEDED FOR COUGH OR WHEEZE   amiodarone 200 MG tablet Commonly known as: PACERONE Take 1 tablet (200 mg total) by mouth  daily.   amLODipine 5 MG tablet Commonly known as: NORVASC Take 0.5 tablets (2.5 mg total) by mouth 2 (two) times daily.   apixaban 2.5 MG Tabs tablet Commonly known as: ELIQUIS Take 1 tablet (2.5 mg total) by mouth 2 (two) times daily.   atorvastatin 20 MG tablet Commonly known as: LIPITOR Take 1 tablet (20 mg total) by mouth at bedtime.   Centrum Silver 50+Men Tabs Take 1 tablet by mouth at bedtime.   docusate sodium 100 MG capsule Commonly known as: Colace Take 1 capsule (100 mg total) by mouth 2 (two) times daily.    ezetimibe 10 MG tablet Commonly known as: ZETIA TAKE 1 TABLET (10 MG TOTAL) BY MOUTH DAILY.   fenofibrate 145 MG tablet Commonly known as: TRICOR Take 0.5 tablets (72.5 mg total) by mouth daily.   Fish Oil 1000 MG Caps Take 1,000 mg by mouth 2 (two) times daily.   fluticasone 110 MCG/ACT inhaler Commonly known as: FLOVENT HFA Inhale 1 puff into the lungs 3 (three) times daily as needed (for seasonal flares).   ipratropium 0.06 % nasal spray Commonly known as: ATROVENT Can use two sprays in each nostril every six hours as needed to dry up nose   isosorbide mononitrate 60 MG 24 hr tablet Commonly known as: IMDUR TAKE 90 MG IN AM AND 30 MG IN PM   loratadine 10 MG tablet Commonly known as: CLARITIN Take 10  mg by mouth every evening.   nitroGLYCERIN 0.4 MG/SPRAY spray Commonly known as: NITROLINGUAL PLACE 1 SPRAY UNDER THE TONGUE EVERY 5 (FIVE) MINUTES X 3 DOSES AS NEEDED FOR CHEST PAIN.   pantoprazole 40 MG tablet Commonly known as: PROTONIX Take 1 tablet (40 mg total) by mouth daily.   ranolazine 500 MG 12 hr tablet Commonly known as: RANEXA TAKE 1 TABLET BY MOUTH TWICE A DAY   vitamin C 500 MG tablet Commonly known as: ASCORBIC ACID Take 500 mg by mouth at bedtime.   vitamin E 400 UNIT capsule Take 400 Units by mouth daily.   zinc gluconate 50 MG tablet Take 50 mg by mouth 2 (two) times a week.       Past Medical History:   Diagnosis Date  . Arthritis    "just a touch in my hands" (11/24/2016)  . Asthma   . Atherosclerosis of renal artery ([place])    RENAL DOPPLER, 12/10/2011 - Left renal artery demonstrated narrowing with elevated velocities consistent with a 1-59% diameter reduction  . CKD (chronic kidney disease) stage 3, GFR 30-59 ml/min    "stable now since they backed off the water pills" (11/24/2016)  . Coronary artery disease    a. 1994 s/p CABG x 4 (LIMA-LAD, VG->D2, VG->OM, VG->RCA); b. 02/2004 PCI SVG-D2 (3.5x16 Taxus DES). VG->RCA 100. Sev apical LAD dzs distal to LIMA insertion; c. 11/2016 Cath: LAD 95p/179m, D2 100ost, LCX 100ost, 70p/m, RCA 100p/m, RPDA fills via L->R collats. VG->RCA 100, VG->D2 20 ost, patent prox stent, 10m, LIMA->LAD ok, VG->OM3 20p. EF 55%-->Med Rx.  . GERD (gastroesophageal reflux disease)   . High cholesterol   . History of lower GI bleeding    a. 09/2014 GIB due to diverticulosis/diverticulitis.  . Iron deficiency anemia   . Labile Hypertension   . Moderate aortic stenosis    a. 10/2011 Echo:  EF >55%, mild-mod TR, mild-mod AS, mod Ca2+ of AoV leaflets; b. 07/2016 Echo: EF 60-65%, no rwma, Gr1 DD, mod AS [(S) mean grad 48mmHg, peak grad 64mmHg. Valve area (VTI): 1.33cm^2, (Vmax) 1.44cm^2. Mild MR]; c. 02/2018 Echo: EF 55-60%, no rwma, GR1 DD, Mod AS [Peak Vel (S): 319cm/s, Mean grad (S) 39mmHg, peak grad (S) 38mmHg].  [name] PAF (paroxysmal atrial fibrillation) ([facility])    a. CHA2DS2VASc = 4-->Eliquis.    Past Surgical History:  Procedure Laterality Date  . CARDIAC CATHETERIZATION  02/27/2004   Coronary intervention and medical management  . CARDIAC CATHETERIZATION  11/24/2016  . CATARACT EXTRACTION W/ INTRAOCULAR LENS IMPLANT Left   . CORONARY ANGIOPLASTY  1994 X 2   "before bypass surgery"  . CORONARY ANGIOPLASTY WITH STENT PLACEMENT  03/04/2004   SVG supplying the diagonal vessel stented with a 3.5x82mm Taxus stent post dilated to 4.0 mm  . CORONARY ARTERY BYPASS GRAFT  1994    "CABG X4"  . INGUINAL HERNIA REPAIR Right   . RIGHT HEART CATH AND CORONARY/GRAFT ANGIOGRAPHY N/A 11/24/2016   Procedure: Right Heart Cath and Coronary/Graft Angiography;  Surgeon: Troy Sine, MD;  Location: [facility];  Service: Cardiovascular;  Laterality: N/A;  . TONSILLECTOMY AND ADENOIDECTOMY      Review of systems negative except as noted in HPI / PMHx or noted below:  Review of Systems  Constitutional: Negative.   HENT: Negative.   Eyes: Negative.   Respiratory: Negative.   Cardiovascular: Negative.   Gastrointestinal: Negative.   Genitourinary: Negative.   Musculoskeletal: Negative.   Skin: Negative.   Neurological: Negative.  Endo/Heme/Allergies: Negative.   Psychiatric/Behavioral: Negative.      Objective:   Vitals:   02/28/19 1020  BP: 138/78  Pulse: 68  Resp: 18  Temp: 97.9 F (36.6 C)  SpO2: 97%   Height: 5' 7.7" (172 cm)  Weight: 190 lb 12.8 oz (86.5 kg)   Physical Exam Constitutional:      Appearance: He is not diaphoretic.  HENT:     Head: Normocephalic.     Comments: Right hearing aid    Right Ear: Tympanic membrane, ear canal and external ear normal.     Left Ear: Tympanic membrane, ear canal and external ear normal.     Nose: Nose normal. No mucosal edema or rhinorrhea.     Mouth/Throat:     Pharynx: Uvula midline. No oropharyngeal exudate.  Eyes:     Conjunctiva/sclera: Conjunctivae normal.  Neck:     Thyroid: No thyromegaly.     Trachea: Trachea normal. No tracheal tenderness or tracheal deviation.  Cardiovascular:     Rate and Rhythm: Normal rate and regular rhythm.     Heart sounds: S1 normal and S2 normal. Murmur (Systolic) present.  Pulmonary:     Effort: No respiratory distress.     Breath sounds: Normal breath sounds. No stridor. No wheezing or rales.  Lymphadenopathy:     Head:     Right side of head: No tonsillar adenopathy.     Left side of head: No tonsillar adenopathy.     Cervical: No cervical adenopathy.   Skin:    Findings: No erythema or rash.     Nails: There is no clubbing.   Neurological:     Mental Status: He is alert.     Diagnostics:    Spirometry was performed and demonstrated an FEV1 of 2.86 at 115 % of predicted.  The patient had an Asthma Control Test with the following results: ACT Total Score: 25.    Assessment and Plan:   1. Perennial allergic rhinitis   2. Asthma, mild intermittent, well-controlled   3. LPRD (laryngopharyngeal reflux disease)     1. Start nasal budesonide -1 spray each nostril 3 times per week.  Takes days to work  2. Continue pantoprazole 40 mg daily  3.  If needed use the following medications:    A. Albuterol HFA 2 inhalations every 4-6 hours   B. OTC antihistamine & nasal saline  C.  Nasal ipratropium 0.06% 2 sprays each nostril every 6 hours  4. Return to clinic in 12 months or earlier if problem   5. Obtain Covid vaccine when available  We will have Cesario restart some nasal steroid using nasal budesonide at a relatively low dose given his previous history of developing some problems with epistaxis when using a nasal steroid.  He will continue on pantoprazole for his reflux.  He has several medications that he can utilize should they be required as noted above.  Assuming he does well I will see him back in this clinic in 12 months or earlier if there is a problem.  I asked him to once again address the issue of his hearing aid dysfunction with his audiologist.  Laurette SchimkeEric Nancyjo Givhan, MD Allergy / Immunology [facility]

## 2019-02-28 NOTE — Patient Instructions (Signed)
  1.  Start nasal budesonide -1 spray each nostril 3 times per week.  Takes days to work  2. Continue pantoprazole 40 mg daily  3.  If needed use the following medications:    A. Albuterol HFA 2 inhalations every 4-6 hours   B. OTC antihistamine & nasal saline  C.  Nasal ipratropium 0.06% 2 sprays each nostril every 6 hours  4. Return to clinic in 12 months or earlier if problem   5. Obtain Covid vaccine when available

## 2019-02-28 NOTE — Telephone Encounter (Signed)
51M 86.5 kg SCr 1.73 (03/2018), LOV TK 07/2018

## 2019-03-01 ENCOUNTER — Encounter: Payer: Self-pay | Admitting: Allergy and Immunology

## 2019-03-09 ENCOUNTER — Other Ambulatory Visit: Payer: Self-pay | Admitting: Physician Assistant

## 2019-03-09 DIAGNOSIS — I25708 Atherosclerosis of coronary artery bypass graft(s), unspecified, with other forms of angina pectoris: Secondary | ICD-10-CM

## 2019-03-09 MED ORDER — NITROGLYCERIN 0.4 MG/SPRAY TL SOLN: 1.0000 | 2 refills | Status: DC | PRN

## 2019-04-14 ENCOUNTER — Other Ambulatory Visit: Payer: Self-pay | Admitting: Cardiovascular Disease

## 2019-04-14 DIAGNOSIS — I25708 Atherosclerosis of coronary artery bypass graft(s), unspecified, with other forms of angina pectoris: Secondary | ICD-10-CM

## 2019-04-14 NOTE — Telephone Encounter (Signed)
New Message      *STAT* If patient is at the pharmacy, call can be transferred to refill team.   1. Which medications need to be refilled? (please list name of each medication and dose if known) nitroGLYCERIN (NITROLINGUAL) 0.4 MG/SPRAY spray  2. Which pharmacy/location (including street and city if local pharmacy) is medication to be sent to? [id number] - [address], [place]  3. Do they need a 30 day or 90 day supply? 90  Pt would like to pick up tonight

## 2019-04-17 MED ORDER — NITROGLYCERIN 0.4 MG/SPRAY TL SOLN: 1.0000 | 2 refills | Status: DC | PRN

## 2019-04-21 ENCOUNTER — Other Ambulatory Visit: Payer: Self-pay | Admitting: Cardiovascular Disease

## 2019-06-07 ENCOUNTER — Telehealth: Payer: Self-pay

## 2019-06-07 NOTE — Telephone Encounter (Signed)
   [facility] Pre-operative Risk Assessment    Request for surgical clearance:  1. What type of surgery is being performed? CATARACT EXTRACTION BY PE, IOL- RIGHT   2. When is this surgery scheduled? 06/19/2019   3. What type of clearance is required (medical clearance vs. Pharmacy clearance to hold med vs. Both)? MEDICAL CLEARANCE  4. Are there any medications that need to be held prior to surgery and how long? NOT SPECIFIED   5. Practice name and name of physician performing surgery? [facility]; DR. Herbie Baltimore SHARPE  6. What is your office phone number [phone]    7.   What is your office fax number [phone]  8.   Anesthesia type (None, local, MAC, general) ? IV SEDATION   Lyris Hitchman O Oliana Gowens 06/07/2019, 9:54 AM  _________________________________________________________________   (provider comments below)

## 2019-06-07 NOTE — Telephone Encounter (Signed)
   Primary Cardiologist: Nicki Guadalajara, MD  Chart reviewed as part of pre-operative protocol coverage. Cataract extractions are recognized in guidelines as low risk surgeries that do not typically require specific preoperative testing or holding of blood thinner therapy. Therefore, given past medical history and time since last visit, based on ACC/AHA guidelines, Jorge Cherry would be at acceptable risk for the planned procedure without further cardiovascular testing.   I will route this recommendation to the requesting party via Epic fax function and remove from pre-op pool.  Please call with questions.  [place], [place] 06/07/2019, 2:20 PM

## 2019-06-10 ENCOUNTER — Other Ambulatory Visit: Payer: Self-pay | Admitting: Cardiovascular Disease

## 2019-06-12 ENCOUNTER — Telehealth: Payer: Self-pay | Admitting: Cardiovascular Disease

## 2019-06-12 ENCOUNTER — Other Ambulatory Visit: Payer: Self-pay | Admitting: Cardiovascular Disease

## 2019-06-12 NOTE — Telephone Encounter (Signed)
Pt c/o medication issue:  1. Name of Medication: amLODipine (NORVASC) 5 MG tablet  2. How are you currently taking this medication (dosage and times per day)? As directed  3. Are you having a reaction (difficulty breathing--STAT)? No  4. What is your medication issue? Patient's wife calling in because she states that there was a note that went along with this medication. She believes it said that if the patients BP got below 140 then to stop taking the medication but she is unsure. She would like clarification.

## 2019-06-12 NOTE — Telephone Encounter (Signed)
Contacted patient wife- she states that she knew the patient was on Amlodipine 2.5 BID, but she states she saw a note from her husband for him to discontinue the medication if the BP was above 140. I advised with wife and patient that we would not want the BP to be consistently above 140- and the reason for this medication is to lower it, not stop it if it gets high. But they would like to check with Dr.Kelly before starting or stopping anything. Wife believes that her husband misunderstood Dr.Avva when he was discussing it.   Will route to MD and discuss when in office. Wife and patient verbalized understanding.

## 2019-06-14 NOTE — Telephone Encounter (Signed)
Follow up   Patient's wife is returning your call. Please call. 

## 2019-06-14 NOTE — Telephone Encounter (Signed)
See below.  Thank you

## 2019-06-14 NOTE — Telephone Encounter (Signed)
Spoke with pt's wife on the phone regarding Dr.Kelly's medication advice of: "Continue amlodipine 2.5mg  twice a day. However, if blood pressure gets below 120mg , just change to 2.5mg  daily."  Wife verbalized understanding and read back advice. No other questions or concerns at this time.

## 2019-06-14 NOTE — Telephone Encounter (Signed)
Continue amlodipine 2.5 mg twice a day.  However, if blood pressure gets below 120 mg, just changed to 2.5 mg daily

## 2019-06-14 NOTE — Telephone Encounter (Signed)
LVMTCB 2/3

## 2019-06-23 ENCOUNTER — Telehealth: Payer: Self-pay | Admitting: Cardiovascular Disease

## 2019-06-23 MED ORDER — RANOLAZINE ER 500 MG PO TB12: 500.0000 mg | ORAL_TABLET | Freq: Two times a day (BID) | ORAL | 0 refills | Status: DC

## 2019-06-23 NOTE — Telephone Encounter (Signed)
New message   *STAT* If patient is at the pharmacy, call can be transferred to refill team.   1. Which medications need to be refilled? (please list name of each medication and dose if known)ranolazine (RANEXA) 500 MG 12 hr tablet  2. Which pharmacy/location (including street and city if local pharmacy) is medication to be sent to?[id number] - [place], [place] - [address]  3. Do they need a 30 day or 90 day supply?90 day

## 2019-06-23 NOTE — Telephone Encounter (Signed)
Patient due for 6 month follow up in March. One month supply given until patient makes follow up appointment. Information notated on script.

## 2019-08-04 ENCOUNTER — Telehealth: Payer: Self-pay | Admitting: Cardiovascular Disease

## 2019-08-04 NOTE — Telephone Encounter (Signed)
  Wife wants to come with patient because his hearing aides are not working and he cannot hear very well. Appointment is with Azalee Course on 08/09/19. Please advise.

## 2019-08-04 NOTE — Telephone Encounter (Signed)
Spoke with patient's spouse. Informed spouse she can accompany patient to appointment. Spouse verbalized understanding.

## 2019-08-09 ENCOUNTER — Encounter: Payer: Self-pay | Admitting: Physician Assistant

## 2019-08-09 ENCOUNTER — Other Ambulatory Visit: Payer: Self-pay

## 2019-08-09 ENCOUNTER — Ambulatory Visit: Payer: Medicare PPO | Admitting: Physician Assistant

## 2019-08-09 VITALS — BP 130/80 | HR 66 | Ht 68.0 in | Wt 195.6 lb

## 2019-08-09 DIAGNOSIS — I25708 Atherosclerosis of coronary artery bypass graft(s), unspecified, with other forms of angina pectoris: Secondary | ICD-10-CM

## 2019-08-09 DIAGNOSIS — I10 Essential (primary) hypertension: Secondary | ICD-10-CM

## 2019-08-09 DIAGNOSIS — E785 Hyperlipidemia, unspecified: Secondary | ICD-10-CM | POA: Diagnosis not present

## 2019-08-09 DIAGNOSIS — I350 Nonrheumatic aortic (valve) stenosis: Secondary | ICD-10-CM

## 2019-08-09 DIAGNOSIS — I480 Paroxysmal atrial fibrillation: Secondary | ICD-10-CM

## 2019-08-09 DIAGNOSIS — N183 Chronic kidney disease, stage 3 unspecified: Secondary | ICD-10-CM

## 2019-08-09 NOTE — Progress Notes (Signed)
Cardiology Office Note:    Date:  08/11/2019   ID:  Doylene BodeGene S Beckles, DOB 10/06/1935, MRN [id number]  PCP:  Chilton GreathouseAvva, Ravisankar, MD  Cardiologist:  Nicki Guadalajarahomas Kelly, MD  Electrophysiologist:  None   Referring MD: Chilton GreathouseAvva, Ravisankar, MD   No chief complaint on file.   History of Present Illness:    Jorge Cherry is a 84 y.o. male with a hx of CAD s/p CABG 1994, CKD stage III, GERD, hypertension, hyperlipidemia, history of GI bleed, aortic stenosis and PAF.  Patient underwent stenting to the proximal SVG to diagonal in October 2003.  She had diffuse disease in the distal apical portion of LAD beyond the LIMA insertion, this was treated medically.  Patient was hospitalized here in 2016 due to GI bleed secondary to diverticulosis of the colon.  Last cardiac catheterization performed on 11/24/2016 showed occluded SVG to distal RCA, patent SVG to second diagonal, patent D2 stent, patent SVG to distal marginal branch, patent LIMA to mid LAD.  He did have significant disease in proximal LAD feeding the diagonal which was supplied by LIMA graft, however since there was total occlusion after the septal perforator, he was felt not to be a candidate for intervention to the proximal LAD at the time.  Echocardiogram obtained on 03/03/2018 showed EF 55 to 60%, grade 1 DD, moderate aortic stenosis, mild MR, moderate LAE.  I last saw the patient in November 2019.  Amiodarone therapy was reduced.  Patient presents today for cardiology office visit.  He has been doing well according to his wife.  He is hard of hearing.  He only has angina with very strenuous activity however does not have angina with regular daily activity.  He has no significant lower extremity edema in the right leg, he has chronic 2+ pitting edema in the left leg related to vein harvesting from the bypass surgery.  He denies any palpitation.  Recent lab work shows normal hemoglobin, creatinine 1.4 which is stable for him.  He is overdue for repeat echocardiogram  to check his aortic stenosis.  Otherwise, his blood pressure is well controlled on the current therapy.  He can follow-up in 6 months.   Past Medical History:  Diagnosis Date  . Arthritis    "just a touch in my hands" (11/24/2016)  . Asthma   . Atherosclerosis of renal artery (HCC)    RENAL DOPPLER, 12/10/2011 - Left renal artery demonstrated narrowing with elevated velocities consistent with a 1-59% diameter reduction  . CKD (chronic kidney disease) stage 3, GFR 30-59 ml/min    "stable now since they backed off the water pills" (11/24/2016)  . Coronary artery disease    a. 1994 s/p CABG x 4 (LIMA-LAD, VG->D2, VG->OM, VG->RCA); b. 02/2004 PCI SVG-D2 (3.5x16 Taxus DES). VG->RCA 100. Sev apical LAD dzs distal to LIMA insertion; c. 11/2016 Cath: LAD 95p/14517m, D2 100ost, LCX 100ost, 70p/m, RCA 100p/m, RPDA fills via L->R collats. VG->RCA 100, VG->D2 20 ost, patent prox stent, 4772m, LIMA->LAD ok, VG->OM3 20p. EF 55%-->Med Rx.  . GERD (gastroesophageal reflux disease)   . High cholesterol   . History of lower GI bleeding    a. 09/2014 GIB due to diverticulosis/diverticulitis.  . Iron deficiency anemia   . Labile Hypertension   . Moderate aortic stenosis    a. 10/2011 Echo:  EF >55%, mild-mod TR, mild-mod AS, mod Ca2+ of AoV leaflets; b. 07/2016 Echo: EF 60-65%, no rwma, Gr1 DD, mod AS [(S) mean grad 21mmHg, peak grad 34mmHg. Valve area (  VTI): 1.33cm^2, (Vmax) 1.44cm^2. Mild MR]; c. 02/2018 Echo: EF 55-60%, no rwma, GR1 DD, Mod AS [Peak Vel (S): 319cm/s, Mean grad (S) , peak grad (S) 42mmHg].  [name] PAF (paroxysmal atrial fibrillation) (HCC)    a. CHA2DS2VASc = 4-->Eliquis.    Past Surgical History:  Procedure Laterality Date  . CARDIAC CATHETERIZATION  02/27/2004   Coronary intervention and medical management  . CARDIAC CATHETERIZATION  11/24/2016  . CATARACT EXTRACTION W/ INTRAOCULAR LENS IMPLANT Left   . CORONARY ANGIOPLASTY  1994 X 2   "before bypass surgery"  . CORONARY ANGIOPLASTY WITH  STENT PLACEMENT  03/04/2004   SVG supplying the diagonal vessel stented with a 3.5x22mm Taxus stent post dilated to 4.0 mm  . CORONARY ARTERY BYPASS GRAFT  1994   "CABG X4"  . INGUINAL HERNIA REPAIR Right   . RIGHT HEART CATH AND CORONARY/GRAFT ANGIOGRAPHY N/A 11/24/2016   Procedure: Right Heart Cath and Coronary/Graft Angiography;  Surgeon: Lennette Bihari, MD;  Location: [facility] INVASIVE CV LAB;  Service: Cardiovascular;  Laterality: N/A;  . TONSILLECTOMY AND ADENOIDECTOMY      Current Medications: Current Meds  Medication Sig  . albuterol (PROAIR HFA) 108 (90 Base) MCG/ACT inhaler INHALE TWO PUFFS EVERY 4-6 HOURS AS NEEDED FOR COUGH OR WHEEZE  . amiodarone (PACERONE) 200 MG tablet Take 1 tablet (200 mg total) by mouth daily.  [name] amLODipine (NORVASC) 2.5 MG tablet Take 1 tablet (2.5 mg total) by mouth 2 (two) times daily. NEED OV.  [name] atorvastatin (LIPITOR) 20 MG tablet Take 1 tablet (20 mg total) by mouth at bedtime.  . Azelastine-Fluticasone (DYMISTA) 137-50 MCG/ACT SUSP Place 1-2 sprays into the nose daily as needed (for seasonal allergies).  [name] ELIQUIS 2.5 MG TABS tablet TAKE 1 TABLET BY MOUTH TWICE A DAY  . ezetimibe (ZETIA) 10 MG tablet TAKE 1 TABLET BY MOUTH EVERY DAY  . fenofibrate (TRICOR) 145 MG tablet Take 0.5 tablets (72.5 mg total) by mouth daily.  . fluticasone (FLOVENT HFA) 110 MCG/ACT inhaler Inhale 1 puff into the lungs 3 (three) times daily as needed (for seasonal flares).  [name] ipratropium (ATROVENT) 0.06 % nasal spray Can use two sprays in each nostril every six hours as needed to dry up nose.  . isosorbide mononitrate (IMDUR) 60 MG 24 hr tablet TAKE 90 MG IN AM AND 30 MG IN PM  . levothyroxine (SYNTHROID) 25 MCG tablet   . loratadine (CLARITIN) 10 MG tablet Take 10 mg by mouth every evening.   . Multiple Vitamins-Minerals (CENTRUM SILVER 50+MEN) TABS Take 1 tablet by mouth at bedtime.  . nitroGLYCERIN (NITROLINGUAL) 0.4 MG/SPRAY spray Place 1 spray under the tongue every 5  (five) minutes x 3 doses as needed for chest pain.  . Omega-3 Fatty Acids (FISH OIL) 1000 MG CAPS Take 1,000 mg by mouth 2 (two) times daily.   . pantoprazole (PROTONIX) 40 MG tablet Take 1 tablet (40 mg total) by mouth daily.  . ranolazine (RANEXA) 500 MG 12 hr tablet Take 1 tablet (500 mg total) by mouth 2 (two) times daily. Please make annual appt with Dr. Tresa Endo in March for refills. Thank you  . vitamin C (ASCORBIC ACID) 500 MG tablet Take 500 mg by mouth at bedtime.   . vitamin E 400 UNIT capsule Take 400 Units by mouth daily.  [name] zinc gluconate 50 MG tablet Take 50 mg by mouth 2 (two) times a week.     Allergies:   Altace [ramipril], Mucinex [guaifenesin er], and Contrast media [iodinated  diagnostic agents]   Social History   Socioeconomic History  . Marital status: Married    Spouse name: Not on file  . Number of children: 2  . Years of education: Not on file  . Highest education level: Not on file  Occupational History  . Not on file  Tobacco Use  . Smoking status: Never Smoker  . Smokeless tobacco: Never Used  Substance and Sexual Activity  . Alcohol use: No    Alcohol/week: 0.0 standard drinks  . Drug use: No  . Sexual activity: Not Currently  Other Topics Concern  . Not on file  Social History Narrative   Lives at home in [place] with wife.  Retired from [employer].     Social Determinants of Health   Financial Resource Strain:   . Difficulty of Paying Living Expenses:   Food Insecurity:   . Worried About Programme researcher, broadcasting/film/video in the Last Year:   . Barista in the Last Year:   Transportation Needs:   . Freight forwarder (Medical):   [name] Lack of Transportation (Non-Medical):   Physical Activity:   . Days of Exercise per Week:   . Minutes of Exercise per Session:   Stress:   . Feeling of Stress :   Social Connections:   . Frequency of Communication with Friends and Family:   . Frequency of Social Gatherings with Friends and Family:    . Attends Religious Services:   . Active Member of Clubs or Organizations:   . Attends Banker Meetings:   [name] Marital Status:      Family History: The patient's family history includes Cancer in his maternal grandfather and mother; Heart disease in his father; Stroke in his maternal grandmother. There is no history of Allergic rhinitis, Angioedema, Asthma, Atopy, Eczema, Immunodeficiency, or Urticaria.  ROS:   Please see the history of present illness.     All other systems reviewed and are negative.  EKGs/Labs/Other Studies Reviewed:    The following studies were reviewed today:  Echo 03/03/2018 LV EF: 55% -  60%   -------------------------------------------------------------------  Indications:   Aortic stenosis 424.1.   -------------------------------------------------------------------  Study Conclusions   - Left ventricle: The cavity size was mildly dilated. There was  mild concentric hypertrophy. Systolic function was normal. The  estimated ejection fraction was in the range of 55% to 60%. Wall  motion was normal; there were no regional wall motion  abnormalities. Doppler parameters are consistent with abnormal  left ventricular relaxation (grade 1 diastolic dysfunction).  - Aortic valve: Trileaflet; severely calcified leaflets. There was  moderate stenosis. Peak velocity (S): 319 cm/s. Mean gradient  (S): 24 mm Hg. Peak gradient (S): 41 mm Hg. Calculated AVA likely  not accurate given suboptimal LVOT velocity measurements.  - Mitral valve: There was mild regurgitation.  - Left atrium: The atrium was moderately dilated.  - Right ventricle: Systolic function was normal.  - Pulmonary arteries: Systolic pressure was within the normal  range.   Impressions:   - No significant change compared to prior study in 2018.   EKG:  EKG is ordered today.  The ekg ordered today demonstrates normal sinus rhythm, poor with question anterior  leads, first-degree AV block, incomplete left bundle branch block.  Recent Labs: No results found for requested labs within last 8760 hours.  Recent Lipid Panel    Component Value Date/Time   CHOL 106 07/01/2016 1345   CHOL 137 04/09/2014 1115  TRIG 120 07/01/2016 1345   TRIG 182 (H) 04/09/2014 1115   HDL 35 (L) 07/01/2016 1345   HDL 32 (L) 04/09/2014 1115   CHOLHDL 3.0 07/01/2016 1345   VLDL 24 07/01/2016 1345   LDLCALC 47 07/01/2016 1345   LDLCALC 69 04/09/2014 1115    Physical Exam:    VS:  BP 130/80   Pulse 66   Ht 5\' 8"  (1.727 m)   Wt 195 lb 9.6 oz (88.7 kg)   SpO2 96%   BMI 29.74 kg/m     Wt Readings from Last 3 Encounters:  08/09/19 195 lb 9.6 oz (88.7 kg)  02/28/19 190 lb 12.8 oz (86.5 kg)  07/25/18 191 lb (86.6 kg)     GEN:  Well nourished, well developed in no acute distress HEENT: Normal NECK: No JVD; No carotid bruits LYMPHATICS: No lymphadenopathy CARDIAC: RRR, no murmurs, rubs, gallops RESPIRATORY:  Clear to auscultation without rales, wheezing or rhonchi  ABDOMEN: Soft, non-tender, non-distended MUSCULOSKELETAL:  LLE edema; No deformity  SKIN: Warm and dry NEUROLOGIC:  Alert and oriented x 3 PSYCHIATRIC:  Normal affect   ASSESSMENT:    1. Coronary artery disease of bypass graft of native heart with stable angina pectoris ([place])   2. Aortic valve stenosis, etiology of cardiac valve disease unspecified   3. Essential hypertension   4. Hyperlipidemia LDL goal <70   5. PAF (paroxysmal atrial fibrillation) (HCC)   6. Stage 3 chronic kidney disease, unspecified whether stage 3a or 3b CKD    PLAN:    In order of problems listed above:  1. CAD s/p CABG: He has known severe disease in proximal LAD resulting in decreased blood flow to the diagonal artery.  This is not amenable to PCI.  He is being managed medically.  He has chronic stable angina with more strenuous activity however not with every day activity.  We will continue on statin.  Not on  aspirin given the need for Eliquis  2. Aortic valve stenosis: Obtain echocardiogram  3. Hypertension: Blood pressure stable  4. Hyperlipidemia: Continue statin therapy  5. PAF: On amiodarone and low-dose Eliquis  6. CKD stage III: Stable on recent lab work.   Medication Adjustments/Labs and Tests Ordered: Current medicines are reviewed at length with the patient today.  Concerns regarding medicines are outlined above.  Orders Placed This Encounter  Procedures  . EKG 12-Lead  . ECHOCARDIOGRAM COMPLETE   No orders of the defined types were placed in this encounter.   Patient Instructions  Medication Instructions:  Your physician recommends that you continue on your current medications as directed. Please refer to the Current Medication list given to you today.  *If you need a refill on your cardiac medications before your next appointment, please call your pharmacy*  Lab Work: NONE ordered at this time of appointment   If you have labs (blood work) drawn today and your tests are completely normal, you will receive your results only by: [name] MyChart Message (if you have MyChart) OR . A paper copy in the mail If you have any lab test that is abnormal or we need to change your treatment, we will call you to review the results.  Testing/Procedures: Your physician has requested that you have an echocardiogram. Echocardiography is a painless test that uses sound waves to create images of your heart. It provides your doctor with information about the size and shape of your heart and how well your heart's chambers and valves are working. This procedure  takes approximately one hour. There are no restrictions for this procedure.   PLEASE SCHEDULE TO BE DONE WITHIN 2 MONTHS  Follow-Up: At [facility], you and your health needs are our priority.  As part of our continuing mission to provide you with exceptional heart care, we have created designated Provider Care Teams.  These Care  Teams include your primary Cardiologist (physician) and Advanced Practice Providers (APPs -  Physician Assistants and Nurse Practitioners) who all work together to provide you with the care you need, when you need it.  Your next appointment:   6 month(s)  The format for your next appointment:   In Person  Provider:   Nicki Guadalajara, MD  Other Instructions      Signed, Azalee Course, PA  08/11/2019 11:17 PM    [facility]

## 2019-08-09 NOTE — Patient Instructions (Signed)
Medication Instructions:  Your physician recommends that you continue on your current medications as directed. Please refer to the Current Medication list given to you today.  *If you need a refill on your cardiac medications before your next appointment, please call your pharmacy*  Lab Work: NONE ordered at this time of appointment   If you have labs (blood work) drawn today and your tests are completely normal, you will receive your results only by: [name] MyChart Message (if you have MyChart) OR . A paper copy in the mail If you have any lab test that is abnormal or we need to change your treatment, we will call you to review the results.  Testing/Procedures: Your physician has requested that you have an echocardiogram. Echocardiography is a painless test that uses sound waves to create images of your heart. It provides your doctor with information about the size and shape of your heart and how well your heart's chambers and valves are working. This procedure takes approximately one hour. There are no restrictions for this procedure.   PLEASE SCHEDULE TO BE DONE WITHIN 2 MONTHS  Follow-Up: At [facility], you and your health needs are our priority.  As part of our continuing mission to provide you with exceptional heart care, we have created designated Provider Care Teams.  These Care Teams include your primary Cardiologist (physician) and Advanced Practice Providers (APPs -  Physician Assistants and Nurse Practitioners) who all work together to provide you with the care you need, when you need it.  Your next appointment:   6 month(s)  The format for your next appointment:   In Person  Provider:   Nicki Guadalajara, MD  Other Instructions

## 2019-08-14 ENCOUNTER — Ambulatory Visit (HOSPITAL_COMMUNITY): Payer: Medicare PPO | Attending: Cardiovascular Disease

## 2019-08-14 ENCOUNTER — Other Ambulatory Visit: Payer: Self-pay

## 2019-08-14 DIAGNOSIS — I350 Nonrheumatic aortic (valve) stenosis: Secondary | ICD-10-CM | POA: Insufficient documentation

## 2019-08-18 ENCOUNTER — Other Ambulatory Visit: Payer: Self-pay

## 2019-08-18 DIAGNOSIS — I350 Nonrheumatic aortic (valve) stenosis: Secondary | ICD-10-CM

## 2019-08-18 NOTE — Progress Notes (Unsigned)
Order placed for the repeat October

## 2019-08-21 ENCOUNTER — Telehealth: Payer: Self-pay | Admitting: Cardiovascular Disease

## 2019-08-21 NOTE — Telephone Encounter (Signed)
Spoke with patient and wife. His AS is now mod-severe. They are concerned about this, interested in getting his valve fixed and some blockage that Dr. Tresa Endo said they could reconsider addressing when his valve needed intervention.   Will route to MD/RN to review

## 2019-08-21 NOTE — Telephone Encounter (Signed)
Wife of the patient called. She wanted to know if it the change in blockage was something she and her patient needed to be worried about. They think 6 months is a long time to wait to have another test done. Please advise

## 2019-08-23 NOTE — Telephone Encounter (Signed)
Patient has had a echo Doppler study in April 2021.  Agree with close surveillance with follow-up echo in 6 months

## 2019-08-25 NOTE — Telephone Encounter (Addendum)
Sent mychart message as well. 4/16

## 2019-08-25 NOTE — Telephone Encounter (Signed)
Spoke with patient and wife and notified them that MD has reviewed echo as well, agrees w/6 month repeat test. Copy of echo report mailed per request

## 2019-08-25 NOTE — Telephone Encounter (Signed)
LVM2CB on cellphone 4/16

## 2019-08-25 NOTE — Telephone Encounter (Signed)
Patient's wife Jorge Cherry is returning call to discuss results.

## 2019-09-07 ENCOUNTER — Other Ambulatory Visit: Payer: Self-pay | Admitting: Cardiovascular Disease

## 2019-09-12 ENCOUNTER — Telehealth: Payer: Self-pay | Admitting: Cardiovascular Disease

## 2019-09-12 ENCOUNTER — Other Ambulatory Visit: Payer: Self-pay | Admitting: Cardiovascular Disease

## 2019-09-12 NOTE — Telephone Encounter (Signed)
83m 88.7kg Scr 1.4 (07/13/19) Lovw/meng 08/09/19 Pt requesting 2.5 mg eliquis when based on dosing guidelines, pt qualifies for 5mg. Will route to pharmd pool 

## 2019-09-12 NOTE — Telephone Encounter (Signed)
57m 88.7kg Scr 1.4 (07/13/19) Lovw/meng 08/09/19 Pt requesting 2.5 mg eliquis when based on dosing guidelines, pt qualifies for 5mg . Will route to pharmd pool

## 2019-09-12 NOTE — Telephone Encounter (Signed)
Scr variable between 1.2 to 1.7 with dx CKD stage 3. Will continue apixaban 2.5mg  for now and re-assess during OV with Dr Tresa Endo on 01/2020.

## 2019-09-12 NOTE — Telephone Encounter (Signed)
°*  STAT* If patient is at the pharmacy, call can be transferred to refill team.   1. Which medications need to be refilled? (please list name of each medication and dose if known) ELIQUIS 2.5 MG TABS tablet  2. Which pharmacy/location (including street and city if local pharmacy) is medication to be sent to? [id number] - [place], [place] - [address]  3. Do they need a 30 day or 90 day supply? 90 day

## 2019-09-17 ENCOUNTER — Other Ambulatory Visit: Payer: Self-pay | Admitting: Cardiovascular Disease

## 2019-09-21 ENCOUNTER — Other Ambulatory Visit: Payer: Self-pay | Admitting: Cardiovascular Disease

## 2019-10-01 ENCOUNTER — Other Ambulatory Visit: Payer: Self-pay | Admitting: Allergy and Immunology

## 2019-10-11 ENCOUNTER — Telehealth: Payer: Self-pay | Admitting: Cardiovascular Disease

## 2019-10-11 NOTE — Telephone Encounter (Signed)
New Message  Pt c/o of Chest Pain: STAT if CP now or developed within 24 hours  1. Are you having CP right now? No  2. Are you experiencing any other symptoms (ex. SOB, nausea, vomiting, sweating)? SOB  3. How long have you been experiencing CP? A Month  4. Is your CP continuous or coming and going? Coming and going  5. Have you taken Nitroglycerin? Yes ?

## 2019-10-11 NOTE — Telephone Encounter (Signed)
Spoke to patient and his wife. Patient has been having chest pain and shortness of breath, more frequent  History of low blood count, has appointment with PCP tomorrow  Scheduled visit with Lynnette Caffey PA Friday

## 2019-10-12 DIAGNOSIS — R05 Cough: Secondary | ICD-10-CM | POA: Diagnosis not present

## 2019-10-12 DIAGNOSIS — J029 Acute pharyngitis, unspecified: Secondary | ICD-10-CM | POA: Diagnosis not present

## 2019-10-12 DIAGNOSIS — D509 Iron deficiency anemia, unspecified: Secondary | ICD-10-CM | POA: Diagnosis not present

## 2019-10-12 DIAGNOSIS — J45909 Unspecified asthma, uncomplicated: Secondary | ICD-10-CM | POA: Diagnosis not present

## 2019-10-13 ENCOUNTER — Other Ambulatory Visit: Payer: Self-pay

## 2019-10-13 ENCOUNTER — Ambulatory Visit: Payer: Medicare PPO | Admitting: Medical

## 2019-10-13 ENCOUNTER — Encounter: Payer: Self-pay | Admitting: Medical

## 2019-10-13 VITALS — BP 152/84 | HR 67 | Ht 68.0 in | Wt 197.4 lb

## 2019-10-13 DIAGNOSIS — I10 Essential (primary) hypertension: Secondary | ICD-10-CM | POA: Diagnosis not present

## 2019-10-13 DIAGNOSIS — N183 Chronic kidney disease, stage 3 unspecified: Secondary | ICD-10-CM | POA: Diagnosis not present

## 2019-10-13 DIAGNOSIS — I25700 Atherosclerosis of coronary artery bypass graft(s), unspecified, with unstable angina pectoris: Secondary | ICD-10-CM

## 2019-10-13 DIAGNOSIS — Z01810 Encounter for preprocedural cardiovascular examination: Secondary | ICD-10-CM

## 2019-10-13 DIAGNOSIS — I480 Paroxysmal atrial fibrillation: Secondary | ICD-10-CM

## 2019-10-13 DIAGNOSIS — E785 Hyperlipidemia, unspecified: Secondary | ICD-10-CM | POA: Diagnosis not present

## 2019-10-13 DIAGNOSIS — I350 Nonrheumatic aortic (valve) stenosis: Secondary | ICD-10-CM | POA: Diagnosis not present

## 2019-10-13 MED ORDER — PREDNISONE 50 MG PO TABS: ORAL_TABLET | ORAL | 0 refills | Status: DC

## 2019-10-13 NOTE — Progress Notes (Signed)
Cardiology Office Note   Date:  10/15/2019   ID:  Jorge Cherry, DOB 05-21-1935, MRN [id number]  PCP:  Chilton Greathouse, MD  Cardiologist:  Nicki Guadalajara, MD EP: None  Chief Complaint  Patient presents with  . Chest Pain  . Shortness of Breath      History of Present Illness: Jorge Cherry is a 84 y.o. male with a PMH of CAD s/p CABG in 1994, paroxysmal atrial fibrillation, moderate-severe aortic stenosis, HTN, HLD, history of GIB, GERD, and CKD stage 3, who presents for the evaluation of chest pain and DOE.   He was last evaluated by cardiology at an outpatient visit with Azalee Course, PA-C at which point he had stable chronic angina with very strenuous activity, as well as chronic LE edema, though otherwise was doing fine from a cardiac standpoint. Prior to this visit his last echocardiogram in 2019 showed normal EF with moderate aortic stenosis. He was recommended to undergo a repeat echo which showed EF 55-60%, G2DD, mild MR, and moderate to severe aortic stenosis  With a mean gradient of 35 mmHg. His last ischemic evaluation was a cardiac cath in 2018 which showed severe multivessel native disease with occluded SVG to dRCA, patent SVG to 2nd diagonal, patent D2 stent, patent SVT to dOM branch, and patent LIMA to mLAD, though there was an area of the pLAD which had 95% stenosis and was not supplied by the LIMA that was recommended for medical management at that time given recent unexplained drop in Hgb in an effort to avoid triple therapy.   He presents today with his wife. He has been having chest pain with less and less activity recently. Pain is relieved with rest and occasional use of his nitroglycerin spray. DOE has also progressed. He has occasional lightheadedness but thankfully no syncope. He has chronic LE edema for which he wears compression stockings and has not noticed any significant changes. No complaints of palpitations, orthopnea, PND, of bleeding. He was seen by his PCP  yesterday and wife thinks his Hgb was 12 at that visit.    Past Medical History:  Diagnosis Date  . Arthritis    "just a touch in my hands" (11/24/2016)  . Asthma   . Atherosclerosis of renal artery (HCC)    RENAL DOPPLER, 12/10/2011 - Left renal artery demonstrated narrowing with elevated velocities consistent with a 1-59% diameter reduction  . CKD (chronic kidney disease) stage 3, GFR 30-59 ml/min    "stable now since they backed off the water pills" (11/24/2016)  . Coronary artery disease    a. 1994 s/p CABG x 4 (LIMA-LAD, VG->D2, VG->OM, VG->RCA); b. 02/2004 PCI SVG-D2 (3.5x16 Taxus DES). VG->RCA 100. Sev apical LAD dzs distal to LIMA insertion; c. 11/2016 Cath: LAD 95p/138m, D2 100ost, LCX 100ost, 70p/m, RCA 100p/m, RPDA fills via L->R collats. VG->RCA 100, VG->D2 20 ost, patent prox stent, 90m, LIMA->LAD ok, VG->OM3 20p. EF 55%-->Med Rx.  . GERD (gastroesophageal reflux disease)   . High cholesterol   . History of lower GI bleeding    a. 09/2014 GIB due to diverticulosis/diverticulitis.  . Iron deficiency anemia   . Labile Hypertension   . Moderate aortic stenosis    a. 10/2011 Echo:  EF >55%, mild-mod TR, mild-mod AS, mod Ca2+ of AoV leaflets; b. 07/2016 Echo: EF 60-65%, no rwma, Gr1 DD, mod AS [(S) mean grad , peak grad . Valve area (VTI): 1.33cm^2, (Vmax) 1.44cm^2. Mild MR]; c. 02/2018 Echo: EF 55-60%, no rwma,  GR1 DD, Mod AS [Peak Vel (S): 319cm/s, Mean grad (S) 99mmHg, peak grad (S) 17mmHg].  [name] PAF (paroxysmal atrial fibrillation) (HCC)    a. CHA2DS2VASc = 4-->Eliquis.    Past Surgical History:  Procedure Laterality Date  . CARDIAC CATHETERIZATION  02/27/2004   Coronary intervention and medical management  . CARDIAC CATHETERIZATION  11/24/2016  . CATARACT EXTRACTION W/ INTRAOCULAR LENS IMPLANT Left   . CORONARY ANGIOPLASTY  1994 X 2   "before bypass surgery"  . CORONARY ANGIOPLASTY WITH STENT PLACEMENT  03/04/2004   SVG supplying the diagonal vessel stented with a  3.5x11mm Taxus stent post dilated to 4.0 mm  . CORONARY ARTERY BYPASS GRAFT  1994   "CABG X4"  . INGUINAL HERNIA REPAIR Right   . RIGHT HEART CATH AND CORONARY/GRAFT ANGIOGRAPHY N/A 11/24/2016   Procedure: Right Heart Cath and Coronary/Graft Angiography;  Surgeon: Troy Sine, MD;  Location: [facility];  Service: Cardiovascular;  Laterality: N/A;  . TONSILLECTOMY AND ADENOIDECTOMY       Current Outpatient Medications  Medication Sig Dispense Refill  . albuterol (PROAIR HFA) 108 (90 Base) MCG/ACT inhaler INHALE TWO PUFFS EVERY 4-6 HOURS AS NEEDED FOR COUGH OR WHEEZE 18 g 5  . amiodarone (PACERONE) 200 MG tablet Take 1 tablet (200 mg total) by mouth daily. 90 tablet 3  . amLODipine (NORVASC) 2.5 MG tablet Take 1 tablet (2.5 mg total) by mouth 2 (two) times daily. NEED OV. 180 tablet 0  . atorvastatin (LIPITOR) 20 MG tablet Take 1 tablet (20 mg total) by mouth at bedtime. 30 tablet 0  . Azelastine-Fluticasone (DYMISTA) 137-50 MCG/ACT SUSP Place 1-2 sprays into the nose daily as needed (for seasonal allergies).    [name] ELIQUIS 2.5 MG TABS tablet TAKE 1 TABLET BY MOUTH TWICE A DAY 180 tablet 1  . ezetimibe (ZETIA) 10 MG tablet TAKE 1 TABLET BY MOUTH EVERY DAY 90 tablet 2  . fenofibrate (TRICOR) 145 MG tablet Take 0.5 tablets (72.5 mg total) by mouth daily. 90 tablet 1  . fluticasone (FLOVENT HFA) 110 MCG/ACT inhaler Inhale 1 puff into the lungs 3 (three) times daily as needed (for seasonal flares).    [name] ipratropium (ATROVENT) 0.06 % nasal spray Can use two sprays in each nostril every six hours as needed to dry up nose. 15 mL 5  . isosorbide mononitrate (IMDUR) 60 MG 24 hr tablet TAKE 1 AND 1/2 TABS (90MG ) BY MOUTH EVERY MORNING AND 1/2 TAB EVERY EVENING 180 tablet 3  . levothyroxine (SYNTHROID) 25 MCG tablet     . loratadine (CLARITIN) 10 MG tablet Take 10 mg by mouth every evening.     . Multiple Vitamins-Minerals (CENTRUM SILVER 50+MEN) TABS Take 1 tablet by mouth at bedtime.    .  nitroGLYCERIN (NITROLINGUAL) 0.4 MG/SPRAY spray Place 1 spray under the tongue every 5 (five) minutes x 3 doses as needed for chest pain. 4.9 g 2  . Omega-3 Fatty Acids (FISH OIL) 1000 MG CAPS Take 1,000 mg by mouth 2 (two) times daily.     . pantoprazole (PROTONIX) 40 MG tablet TAKE 1 TABLET BY MOUTH EVERY DAY 90 tablet 1  . ranolazine (RANEXA) 500 MG 12 hr tablet TAKE 1 TABLET BY MOUTH TWICE A DAY 180 tablet 3  . vitamin C (ASCORBIC ACID) 500 MG tablet Take 500 mg by mouth at bedtime.     . vitamin E 400 UNIT capsule Take 400 Units by mouth daily.    [name] zinc gluconate 50 MG  tablet Take 50 mg by mouth 2 (two) times a week.    . predniSONE (DELTASONE) 50 MG tablet Take 1 tablet 13 hours (Midnight)  before cardiac catheterization. Take 1 tablet 7 hours (6AM) before cardiac catheterization. 2 tablet 0   No current facility-administered medications for this visit.    Allergies:   Altace [ramipril], Mucinex [guaifenesin er], and Contrast media [iodinated diagnostic agents]    Social History:  The patient  reports that he has never smoked. He has never used smokeless tobacco. He reports that he does not drink alcohol or use drugs.   Family History:  The patient's family history includes Cancer in his maternal grandfather and mother; Heart disease in his father; Stroke in his maternal grandmother.    ROS:  Please see the history of present illness.   Otherwise, review of systems are positive for none.   All other systems are reviewed and negative.    PHYSICAL EXAM: VS:  BP (!) 152/84   Pulse 67   Ht 5' 8" (1.727 m)   Wt 197 lb 6.4 oz (89.5 kg)   SpO2 96%   BMI 30.01 kg/m  , BMI Body mass index is 30.01 kg/m. GEN: Well nourished, well developed, in no acute distress HEENT: sclera anicteric Neck: no JVD, carotid bruits, or masses Cardiac: RRR; +murmur, no rubs or gallops,  1-2+ LE edema  Respiratory:  clear to auscultation bilaterally, normal work of breathing GI: soft, nontender,  nondistended, + BS MS: no deformity or atrophy Skin: warm and dry, no rash Neuro:  Strength and sensation are intact Psych: euthymic mood, full affect   EKG:  EKG is ordered today. The ekg ordered today demonstrates sinus rhythm with 1st degree AV block, rate 67 bpm, incomplete LBBB, no STE/D, no significant change from previous   Recent Labs: No results found for requested labs within last 8760 hours.    Lipid Panel    Component Value Date/Time   CHOL 106 07/01/2016 1345   CHOL 137 04/09/2014 1115   TRIG 120 07/01/2016 1345   TRIG 182 (H) 04/09/2014 1115   HDL 35 (L) 07/01/2016 1345   HDL 32 (L) 04/09/2014 1115   CHOLHDL 3.0 07/01/2016 1345   VLDL 24 07/01/2016 1345   LDLCALC 47 07/01/2016 1345   LDLCALC 69 04/09/2014 1115      Wt Readings from Last 3 Encounters:  10/13/19 197 lb 6.4 oz (89.5 kg)  08/09/19 195 lb 9.6 oz (88.7 kg)  02/28/19 190 lb 12.8 oz (86.5 kg)      Other studies Reviewed: Additional studies/ records that were reviewed today include:   Echocardiogram 08/2019: 1. Left ventricular ejection fraction, by estimation, is 55 to 60%. The  left ventricle has normal function. The left ventricle has no regional  wall motion abnormalities. Left ventricular diastolic parameters are  consistent with Grade II diastolic  dysfunction (pseudonormalization).  2. Right ventricular systolic function is normal. The right ventricular  size is normal.  3. The mitral valve is normal in structure. Mild mitral valve  regurgitation. No evidence of mitral stenosis.  4. Tricuspid valve regurgitation is moderate.  5. The aortic valve is tricuspid. Aortic valve regurgitation is not  visualized. Moderate to severe aortic valve stenosis. Aortic valve mean  gradient measures 35.0 mmHg.   Cardiac cath 2018:  Prox RCA to Mid RCA lesion, 100 %stenosed.  SVG.  Origin to Insertion lesion, 100 %stenosed.  Prox LAD lesion, 95 %stenosed.  Mid LAD lesion, 100  %stenosed.    SVG.  Origin to Prox Graft lesion, 5 %stenosed.  Mid Graft lesion, 40 %stenosed.  Ost 2nd Diag lesion, 100 %stenosed.  LIMA.  Ost Cx to Prox Cx lesion, 100 %stenosed.  SVG.  Prox Graft lesion, 20 %stenosed.  Hemodynamic findings consistent with mild pulmonary hypertension.  Prox Cx to Mid Cx lesion, 70 %stenosed.  2nd Diag lesion, 30 %stenosed.  Origin lesion, 20 %stenosed.   Preserved global LV function with focal mild mid anterolateral hypocontractility and an ejection fraction of 55%.  Supravalvular aortography demonstrating upper normal aortic root with mild aortic valve calcification with reduced moderately reduced excursion.  No angiographic aortic insufficiency.  Severe native CAD with 95% proximal LAD stenosis just prior to the first diagonal vessel with total occlusion of the LAD after the third septal perforating artery and before the takeoff of the second diagonal branch; total occlusion of the circumflex at the ostium; and total proximal RCA occlusion.  Patent LIMA graft which supplies the mid LAD, but due to the total occlusion of the LAD after the septal perforating artery, the proximal LAD and diagonal vessel is not supplied by the graft.  Patent vein graft supplying the distal marginal vessel with filling retrograde of the circumflex up to the ostium with 70% mid AV groove stenosis.  There is collateral filling to the distal RCA.  Patent vein graft supplying the second diagonal vessel with 20% ostial narrowing, a patent proximal stent, and diffuse 40% mid graft stenosis.  There is 30% narrowing after the graft anastomosis.  Occluded SVG which had supplied the distal RCA.  RECOMMENDATION: Mr. Mckeithan has developed increasing symptoms of exertional angina leading to his cardiac catheterization.  The present study only demonstrates mild AS.  Of note, admission laboratory today revealed a significant hemoglobin drop compared to 3 months ago and it  is possible that his anemia with hemoglobin of 8 was contributing to his increasing anginal symptomatology.  He has a region of potential proximal LAD ischemia in the territory of the diagonal-1  Vessel due to the 95% stenosis proximally and this segment of the LAD is not supplied by the LIMA graft since the mid LAD os occluded before the graft anastomosis.  His eliquis has been on hold for several days.  Plan to increase medical therapy.  Hemoglobin/hematocrit and iron studies will be rechecked in a.m. and if further drop Dr. Madilyn Fireman will need to be contacted for follow-up GI evaluation.  (He had seen him last week).  At present, will defer intervention to the proximal LAD territory.  At present he is not a candidate for triple drug therapy.  He is maintaining sinus rhythm and eliquis may need to be deferred until  GI evaluation is complete.      ASSESSMENT AND PLAN:  1. Aortic stenosis: moderate-severe on echo 08/2019. Now with worsening exertional chest pain and DOE. Discussed with Dr. Tresa Endo in the office today - Will plan for [facility] to evaluate aortic valve and coronary/graft anatomy. The patient understands that risks included but are not limited to stroke (1 in 1000), death (1 in 1000), kidney failure [usually temporary] (1 in 500), bleeding (1 in 200), allergic reaction [possibly serious] (1 in 200).  The patient understands and agrees to proceed.  - Prednisone/benadryl ordered for contrast allergy - Patient instructed to hold eliquis 36 hours prior to his heart catheterization.   2. CAD s/p CABG in 1994 with PCI of SVG to diagonal in 2003: last cath in 2018 showed occluded SVG to dRCA,  otherwise patent grafts, though he did have an area of the pLAD which had 95% stenosis and was not supplied by the LIMA that was recommended for medical management at that time given recent unexplained drop in Hgb in an effort to avoid triple therapy. Not on aspirin given need for anticoagulation. Not on BBlocker  due to history of bradycardia on metoprolol. Now with worsening chest pain and SOB. Possible his heart disease is also contributing to his symptoms, in addition to #1. - Will plan for [facility] as above.  - Continue statin and zetia  - Continue amlodipine, ranexa, and imdur  3. Paroxysmal atrial fibrillation: EKG with sinus rhythm today. No complaints of bleeding on eliquis. Not on AV nodal blocking agents due to bradycardia trouble in the past - Continue eliquis for stroke ppx with plans to hold for [facility] as above - Continue amiodarone for rhythm control  4. HTN: BP up a bit today, though this is not his norm - Continue amlodipine ad imdur  5. HLD: LDL 45 12/2018; at goal of <23 - continue atorvastatin and zetia  6. CKD stage 3: Cr 1.4 on last check 12/2018 - Will have him arrive early for hydration prior to this cath - Anticipate recheck when seen in follow-up.    Current medicines are reviewed at length with the patient today.  The patient does not have concerns regarding medicines.  The following changes have been made:  As above  Labs/ tests ordered today include:   Orders Placed This Encounter  Procedures  . CBC  . Basic metabolic panel  . EKG 12-Lead     Disposition:   FU with Azalee Course in 2 weeks  Signed, Beatriz Stallion, PA-C  10/15/2019 9:37 PM

## 2019-10-13 NOTE — Patient Instructions (Addendum)
Medication Instructions:  Your physician recommends that you continue on your current medications as directed. Please refer to the Current Medication list given to you today.  *If you need a refill on your cardiac medications before your next appointment, please call your pharmacy*  Lab Work: Your physician recommends that you return for lab work on Monday 10/16/2019:   BMET  CBC  You will need to have a COVID test prior to your cardiac catheterization. You are scheduled for Monday 10/16/19 at 01:55PM. This test is done at [address] [place], [place] [address]  If you have labs (blood work) drawn today and your tests are completely normal, you will receive your results only by: [name] MyChart Message (if you have MyChart) OR . A paper copy in the mail If you have any lab test that is abnormal or we need to change your treatment, we will call you to review the results.  Testing/Procedures: Your physician has requested that you have a cardiac catheterization. Cardiac catheterization is used to diagnose and/or treat various heart conditions. Doctors may recommend this procedure for a number of different reasons. The most common reason is to evaluate chest pain. Chest pain can be a symptom of coronary artery disease (CAD), and cardiac catheterization can show whether plaque is narrowing or blocking your heart's arteries. This procedure is also used to evaluate the valves, as well as measure the blood flow and oxygen levels in different parts of your heart. For further information please visit [email or web address][email or web address]. Please follow instruction sheet, as given.   Follow-Up: At [facility], you and your health needs are our priority.  As part of our continuing mission to provide you with exceptional heart care, we have created designated Provider Care Teams.  These Care Teams include your primary Cardiologist (physician) and Advanced Practice Providers (APPs -  Physician Assistants and Nurse  Practitioners) who all work together to provide you with the care you need, when you need it.   Your next appointment:   2 week(s) after cardiac catheterization-10/18/2019  The format for your next appointment:   In Person  Provider:   You may see Shelva Majestic, MD or one of the following Advanced Practice Providers on your designated Care Team:    Almyra Deforest, PA-C  Fabian Sharp, [place] or   Roby Lofts, [place]  Other Instructions     [address] [place] [address] Dept: [phone] Loc: [phone]  KRYSTOFER HEVENER  10/13/2019  You are scheduled for a Cardiac Catheterization on Wednesday, June 9 with Dr. Shelva Majestic.  1. Please arrive at the [facility] (Main Entrance A) at [facility]: [address] [place], [place] [address] at 9:00 AM (This time is two hours before your procedure to ensure your preparation). Free valet parking service is available.   Special note: Every effort is made to have your procedure done on time. Please understand that emergencies sometimes delay scheduled procedures.  2. Diet: Do not eat solid foods after midnight.  The patient may have clear liquids until 5am upon the day of the procedure.  3. Labs: You will need to have blood drawn on Monday, June 7 at [facility]  Open: 8am - 5pm (Lunch 12:30 - 1:30)   Phone: [phone]. You do not need to be fasting.  4. Medication instructions in preparation for your procedure:   Contrast Allergy: Yes, Please take Prednisone  50mg  by mouth at: Thirteen hours prior to cath 12:00am on Wednesday Seven hours prior to cath 6:00am on Wednesday And prior to leaving home please take last dose of Prednisone 50mg  and Benadryl 50mg  by mouth.   Stop taking Eliquis (Apixiban) on Monday, June 7.   On the morning of your procedure, take your Aspirin and any morning  medicines NOT listed above.  You may use sips of water.  5. Plan for one night stay--bring personal belongings. 6. Bring a current list of your medications and current insurance cards. 7. You MUST have a responsible person to drive you home. 8. Someone MUST be with you the first 24 hours after you arrive home or your discharge will be delayed. 9. Please wear clothes that are easy to get on and off and wear slip-on shoes.  Thank you for allowing to care for you!   -- [facility]

## 2019-10-13 NOTE — H&P (View-Only) (Signed)
Cardiology Office Note   Date:  10/15/2019   ID:  Jorge Cherry, DOB 05-21-1935, MRN [id number]  PCP:  Chilton Greathouse, MD  Cardiologist:  Nicki Guadalajara, MD EP: None  Chief Complaint  Patient presents with  . Chest Pain  . Shortness of Breath      History of Present Illness: Jorge Cherry is a 84 y.o. male with a PMH of CAD s/p CABG in 1994, paroxysmal atrial fibrillation, moderate-severe aortic stenosis, HTN, HLD, history of GIB, GERD, and CKD stage 3, who presents for the evaluation of chest pain and DOE.   He was last evaluated by cardiology at an outpatient visit with Azalee Course, PA-C at which point he had stable chronic angina with very strenuous activity, as well as chronic LE edema, though otherwise was doing fine from a cardiac standpoint. Prior to this visit his last echocardiogram in 2019 showed normal EF with moderate aortic stenosis. He was recommended to undergo a repeat echo which showed EF 55-60%, G2DD, mild MR, and moderate to severe aortic stenosis  With a mean gradient of 35 mmHg. His last ischemic evaluation was a cardiac cath in 2018 which showed severe multivessel native disease with occluded SVG to dRCA, patent SVG to 2nd diagonal, patent D2 stent, patent SVT to dOM branch, and patent LIMA to mLAD, though there was an area of the pLAD which had 95% stenosis and was not supplied by the LIMA that was recommended for medical management at that time given recent unexplained drop in Hgb in an effort to avoid triple therapy.   He presents today with his wife. He has been having chest pain with less and less activity recently. Pain is relieved with rest and occasional use of his nitroglycerin spray. DOE has also progressed. He has occasional lightheadedness but thankfully no syncope. He has chronic LE edema for which he wears compression stockings and has not noticed any significant changes. No complaints of palpitations, orthopnea, PND, of bleeding. He was seen by his PCP  yesterday and wife thinks his Hgb was 12 at that visit.    Past Medical History:  Diagnosis Date  . Arthritis    "just a touch in my hands" (11/24/2016)  . Asthma   . Atherosclerosis of renal artery (HCC)    RENAL DOPPLER, 12/10/2011 - Left renal artery demonstrated narrowing with elevated velocities consistent with a 1-59% diameter reduction  . CKD (chronic kidney disease) stage 3, GFR 30-59 ml/min    "stable now since they backed off the water pills" (11/24/2016)  . Coronary artery disease    a. 1994 s/p CABG x 4 (LIMA-LAD, VG->D2, VG->OM, VG->RCA); b. 02/2004 PCI SVG-D2 (3.5x16 Taxus DES). VG->RCA 100. Sev apical LAD dzs distal to LIMA insertion; c. 11/2016 Cath: LAD 95p/138m, D2 100ost, LCX 100ost, 70p/m, RCA 100p/m, RPDA fills via L->R collats. VG->RCA 100, VG->D2 20 ost, patent prox stent, 90m, LIMA->LAD ok, VG->OM3 20p. EF 55%-->Med Rx.  . GERD (gastroesophageal reflux disease)   . High cholesterol   . History of lower GI bleeding    a. 09/2014 GIB due to diverticulosis/diverticulitis.  . Iron deficiency anemia   . Labile Hypertension   . Moderate aortic stenosis    a. 10/2011 Echo:  EF >55%, mild-mod TR, mild-mod AS, mod Ca2+ of AoV leaflets; b. 07/2016 Echo: EF 60-65%, no rwma, Gr1 DD, mod AS [(S) mean grad , peak grad . Valve area (VTI): 1.33cm^2, (Vmax) 1.44cm^2. Mild MR]; c. 02/2018 Echo: EF 55-60%, no rwma,  GR1 DD, Mod AS [Peak Vel (S): 319cm/s, Mean grad (S) 99mmHg, peak grad (S) 17mmHg].  [name] PAF (paroxysmal atrial fibrillation) (HCC)    a. CHA2DS2VASc = 4-->Eliquis.    Past Surgical History:  Procedure Laterality Date  . CARDIAC CATHETERIZATION  02/27/2004   Coronary intervention and medical management  . CARDIAC CATHETERIZATION  11/24/2016  . CATARACT EXTRACTION W/ INTRAOCULAR LENS IMPLANT Left   . CORONARY ANGIOPLASTY  1994 X 2   "before bypass surgery"  . CORONARY ANGIOPLASTY WITH STENT PLACEMENT  03/04/2004   SVG supplying the diagonal vessel stented with a  3.5x11mm Taxus stent post dilated to 4.0 mm  . CORONARY ARTERY BYPASS GRAFT  1994   "CABG X4"  . INGUINAL HERNIA REPAIR Right   . RIGHT HEART CATH AND CORONARY/GRAFT ANGIOGRAPHY N/A 11/24/2016   Procedure: Right Heart Cath and Coronary/Graft Angiography;  Surgeon: Troy Sine, MD;  Location: [facility];  Service: Cardiovascular;  Laterality: N/A;  . TONSILLECTOMY AND ADENOIDECTOMY       Current Outpatient Medications  Medication Sig Dispense Refill  . albuterol (PROAIR HFA) 108 (90 Base) MCG/ACT inhaler INHALE TWO PUFFS EVERY 4-6 HOURS AS NEEDED FOR COUGH OR WHEEZE 18 g 5  . amiodarone (PACERONE) 200 MG tablet Take 1 tablet (200 mg total) by mouth daily. 90 tablet 3  . amLODipine (NORVASC) 2.5 MG tablet Take 1 tablet (2.5 mg total) by mouth 2 (two) times daily. NEED OV. 180 tablet 0  . atorvastatin (LIPITOR) 20 MG tablet Take 1 tablet (20 mg total) by mouth at bedtime. 30 tablet 0  . Azelastine-Fluticasone (DYMISTA) 137-50 MCG/ACT SUSP Place 1-2 sprays into the nose daily as needed (for seasonal allergies).    [name] ELIQUIS 2.5 MG TABS tablet TAKE 1 TABLET BY MOUTH TWICE A DAY 180 tablet 1  . ezetimibe (ZETIA) 10 MG tablet TAKE 1 TABLET BY MOUTH EVERY DAY 90 tablet 2  . fenofibrate (TRICOR) 145 MG tablet Take 0.5 tablets (72.5 mg total) by mouth daily. 90 tablet 1  . fluticasone (FLOVENT HFA) 110 MCG/ACT inhaler Inhale 1 puff into the lungs 3 (three) times daily as needed (for seasonal flares).    [name] ipratropium (ATROVENT) 0.06 % nasal spray Can use two sprays in each nostril every six hours as needed to dry up nose. 15 mL 5  . isosorbide mononitrate (IMDUR) 60 MG 24 hr tablet TAKE 1 AND 1/2 TABS (90MG ) BY MOUTH EVERY MORNING AND 1/2 TAB EVERY EVENING 180 tablet 3  . levothyroxine (SYNTHROID) 25 MCG tablet     . loratadine (CLARITIN) 10 MG tablet Take 10 mg by mouth every evening.     . Multiple Vitamins-Minerals (CENTRUM SILVER 50+MEN) TABS Take 1 tablet by mouth at bedtime.    .  nitroGLYCERIN (NITROLINGUAL) 0.4 MG/SPRAY spray Place 1 spray under the tongue every 5 (five) minutes x 3 doses as needed for chest pain. 4.9 g 2  . Omega-3 Fatty Acids (FISH OIL) 1000 MG CAPS Take 1,000 mg by mouth 2 (two) times daily.     . pantoprazole (PROTONIX) 40 MG tablet TAKE 1 TABLET BY MOUTH EVERY DAY 90 tablet 1  . ranolazine (RANEXA) 500 MG 12 hr tablet TAKE 1 TABLET BY MOUTH TWICE A DAY 180 tablet 3  . vitamin C (ASCORBIC ACID) 500 MG tablet Take 500 mg by mouth at bedtime.     . vitamin E 400 UNIT capsule Take 400 Units by mouth daily.    [name] zinc gluconate 50 MG  tablet Take 50 mg by mouth 2 (two) times a week.    . predniSONE (DELTASONE) 50 MG tablet Take 1 tablet 13 hours (Midnight)  before cardiac catheterization. Take 1 tablet 7 hours (6AM) before cardiac catheterization. 2 tablet 0   No current facility-administered medications for this visit.    Allergies:   Altace [ramipril], Mucinex [guaifenesin er], and Contrast media [iodinated diagnostic agents]    Social History:  The patient  reports that he has never smoked. He has never used smokeless tobacco. He reports that he does not drink alcohol or use drugs.   Family History:  The patient's family history includes Cancer in his maternal grandfather and mother; Heart disease in his father; Stroke in his maternal grandmother.    ROS:  Please see the history of present illness.   Otherwise, review of systems are positive for none.   All other systems are reviewed and negative.    PHYSICAL EXAM: VS:  BP (!) 152/84   Pulse 67   Ht 5\' 8"  (1.727 m)   Wt 197 lb 6.4 oz (89.5 kg)   SpO2 96%   BMI 30.01 kg/m  , BMI Body mass index is 30.01 kg/m. GEN: Well nourished, well developed, in no acute distress HEENT: sclera anicteric Neck: no JVD, carotid bruits, or masses Cardiac: RRR; +murmur, no rubs or gallops,  1-2+ LE edema  Respiratory:  clear to auscultation bilaterally, normal work of breathing GI: soft, nontender,  nondistended, + BS MS: no deformity or atrophy Skin: warm and dry, no rash Neuro:  Strength and sensation are intact Psych: euthymic mood, full affect   EKG:  EKG is ordered today. The ekg ordered today demonstrates sinus rhythm with 1st degree AV block, rate 67 bpm, incomplete LBBB, no STE/D, no significant change from previous   Recent Labs: No results found for requested labs within last 8760 hours.    Lipid Panel    Component Value Date/Time   CHOL 106 07/01/2016 1345   CHOL 137 04/09/2014 1115   TRIG 120 07/01/2016 1345   TRIG 182 (H) 04/09/2014 1115   HDL 35 (L) 07/01/2016 1345   HDL 32 (L) 04/09/2014 1115   CHOLHDL 3.0 07/01/2016 1345   VLDL 24 07/01/2016 1345   LDLCALC 47 07/01/2016 1345   LDLCALC 69 04/09/2014 1115      Wt Readings from Last 3 Encounters:  10/13/19 197 lb 6.4 oz (89.5 kg)  08/09/19 195 lb 9.6 oz (88.7 kg)  02/28/19 190 lb 12.8 oz (86.5 kg)      Other studies Reviewed: Additional studies/ records that were reviewed today include:   Echocardiogram 08/2019: 1. Left ventricular ejection fraction, by estimation, is 55 to 60%. The  left ventricle has normal function. The left ventricle has no regional  wall motion abnormalities. Left ventricular diastolic parameters are  consistent with Grade II diastolic  dysfunction (pseudonormalization).  2. Right ventricular systolic function is normal. The right ventricular  size is normal.  3. The mitral valve is normal in structure. Mild mitral valve  regurgitation. No evidence of mitral stenosis.  4. Tricuspid valve regurgitation is moderate.  5. The aortic valve is tricuspid. Aortic valve regurgitation is not  visualized. Moderate to severe aortic valve stenosis. Aortic valve mean  gradient measures 35.0 mmHg.   Cardiac cath 2018:  Prox RCA to Mid RCA lesion, 100 %stenosed.  SVG.  Origin to Insertion lesion, 100 %stenosed.  Prox LAD lesion, 95 %stenosed.  Mid LAD lesion, 100  %stenosed.  SVG.  Origin to Prox Graft lesion, 5 %stenosed.  Mid Graft lesion, 40 %stenosed.  Ost 2nd Diag lesion, 100 %stenosed.  LIMA.  Ost Cx to Prox Cx lesion, 100 %stenosed.  SVG.  Prox Graft lesion, 20 %stenosed.  Hemodynamic findings consistent with mild pulmonary hypertension.  Prox Cx to Mid Cx lesion, 70 %stenosed.  2nd Diag lesion, 30 %stenosed.  Origin lesion, 20 %stenosed.   Preserved global LV function with focal mild mid anterolateral hypocontractility and an ejection fraction of 55%.  Supravalvular aortography demonstrating upper normal aortic root with mild aortic valve calcification with reduced moderately reduced excursion.  No angiographic aortic insufficiency.  Severe native CAD with 95% proximal LAD stenosis just prior to the first diagonal vessel with total occlusion of the LAD after the third septal perforating artery and before the takeoff of the second diagonal branch; total occlusion of the circumflex at the ostium; and total proximal RCA occlusion.  Patent LIMA graft which supplies the mid LAD, but due to the total occlusion of the LAD after the septal perforating artery, the proximal LAD and diagonal vessel is not supplied by the graft.  Patent vein graft supplying the distal marginal vessel with filling retrograde of the circumflex up to the ostium with 70% mid AV groove stenosis.  There is collateral filling to the distal RCA.  Patent vein graft supplying the second diagonal vessel with 20% ostial narrowing, a patent proximal stent, and diffuse 40% mid graft stenosis.  There is 30% narrowing after the graft anastomosis.  Occluded SVG which had supplied the distal RCA.  RECOMMENDATION: Mr. Mckeithan has developed increasing symptoms of exertional angina leading to his cardiac catheterization.  The present study only demonstrates mild AS.  Of note, admission laboratory today revealed a significant hemoglobin drop compared to 3 months ago and it  is possible that his anemia with hemoglobin of 8 was contributing to his increasing anginal symptomatology.  He has a region of potential proximal LAD ischemia in the territory of the diagonal-1  Vessel due to the 95% stenosis proximally and this segment of the LAD is not supplied by the LIMA graft since the mid LAD os occluded before the graft anastomosis.  His eliquis has been on hold for several days.  Plan to increase medical therapy.  Hemoglobin/hematocrit and iron studies will be rechecked in a.m. and if further drop Dr. Madilyn Fireman will need to be contacted for follow-up GI evaluation.  (He had seen him last week).  At present, will defer intervention to the proximal LAD territory.  At present he is not a candidate for triple drug therapy.  He is maintaining sinus rhythm and eliquis may need to be deferred until  GI evaluation is complete.      ASSESSMENT AND PLAN:  1. Aortic stenosis: moderate-severe on echo 08/2019. Now with worsening exertional chest pain and DOE. Discussed with Dr. Tresa Endo in the office today - Will plan for [facility] to evaluate aortic valve and coronary/graft anatomy. The patient understands that risks included but are not limited to stroke (1 in 1000), death (1 in 1000), kidney failure [usually temporary] (1 in 500), bleeding (1 in 200), allergic reaction [possibly serious] (1 in 200).  The patient understands and agrees to proceed.  - Prednisone/benadryl ordered for contrast allergy - Patient instructed to hold eliquis 36 hours prior to his heart catheterization.   2. CAD s/p CABG in 1994 with PCI of SVG to diagonal in 2003: last cath in 2018 showed occluded SVG to dRCA,  otherwise patent grafts, though he did have an area of the pLAD which had 95% stenosis and was not supplied by the LIMA that was recommended for medical management at that time given recent unexplained drop in Hgb in an effort to avoid triple therapy. Not on aspirin given need for anticoagulation. Not on BBlocker  due to history of bradycardia on metoprolol. Now with worsening chest pain and SOB. Possible his heart disease is also contributing to his symptoms, in addition to #1. - Will plan for [facility] as above.  - Continue statin and zetia  - Continue amlodipine, ranexa, and imdur  3. Paroxysmal atrial fibrillation: EKG with sinus rhythm today. No complaints of bleeding on eliquis. Not on AV nodal blocking agents due to bradycardia trouble in the past - Continue eliquis for stroke ppx with plans to hold for [facility] as above - Continue amiodarone for rhythm control  4. HTN: BP up a bit today, though this is not his norm - Continue amlodipine ad imdur  5. HLD: LDL 45 12/2018; at goal of <23 - continue atorvastatin and zetia  6. CKD stage 3: Cr 1.4 on last check 12/2018 - Will have him arrive early for hydration prior to this cath - Anticipate recheck when seen in follow-up.    Current medicines are reviewed at length with the patient today.  The patient does not have concerns regarding medicines.  The following changes have been made:  As above  Labs/ tests ordered today include:   Orders Placed This Encounter  Procedures  . CBC  . Basic metabolic panel  . EKG 12-Lead     Disposition:   FU with Azalee Course in 2 weeks  Signed, Beatriz Stallion, PA-C  10/15/2019 9:37 PM

## 2019-10-15 ENCOUNTER — Encounter: Payer: Self-pay | Admitting: Medical

## 2019-10-16 ENCOUNTER — Other Ambulatory Visit (HOSPITAL_COMMUNITY)
Admission: RE | Admit: 2019-10-16 | Discharge: 2019-10-16 | Disposition: A | Discharge status: Home / Self Care | Payer: Medicare PPO | Source: Ambulatory Visit | Attending: Cardiovascular Disease | Admitting: Cardiovascular Disease

## 2019-10-16 DIAGNOSIS — Z01812 Encounter for preprocedural laboratory examination: Secondary | ICD-10-CM | POA: Diagnosis not present

## 2019-10-16 DIAGNOSIS — Z20822 Contact with and (suspected) exposure to covid-19: Secondary | ICD-10-CM | POA: Diagnosis not present

## 2019-10-16 DIAGNOSIS — Z01810 Encounter for preprocedural cardiovascular examination: Secondary | ICD-10-CM | POA: Diagnosis not present

## 2019-10-17 ENCOUNTER — Telehealth: Payer: Self-pay | Admitting: *Deleted

## 2019-10-17 ENCOUNTER — Telehealth: Payer: Self-pay

## 2019-10-17 LAB — CBC
Hematocrit: 36.5 % — ABNORMAL LOW (ref 37.5–51.0)
Hemoglobin: 12 g/dL — ABNORMAL LOW (ref 13.0–17.7)
MCH: 32 pg (ref 26.6–33.0)
MCHC: 32.9 g/dL (ref 31.5–35.7)
MCV: 97 fL (ref 79–97)
Platelets: 156 10*3/uL (ref 150–450)
RBC: 3.75 x10E6/uL — ABNORMAL LOW (ref 4.14–5.80)
RDW: 12.2 % (ref 11.6–15.4)
WBC: 5.1 10*3/uL (ref 3.4–10.8)

## 2019-10-17 LAB — BASIC METABOLIC PANEL
BUN/Creatinine Ratio: 13 (ref 10–24)
BUN: 17 mg/dL (ref 8–27)
CO2: 19 mmol/L — ABNORMAL LOW (ref 20–29)
Calcium: 8.9 mg/dL (ref 8.6–10.2)
Chloride: 110 mmol/L — ABNORMAL HIGH (ref 96–106)
Creatinine, Ser: 1.35 mg/dL — ABNORMAL HIGH (ref 0.76–1.27)
GFR calc Af Amer: 57 mL/min/{1.73_m2} — ABNORMAL LOW (ref >59)
GFR calc non Af Amer: 49 mL/min/{1.73_m2} — ABNORMAL LOW (ref >59)
Glucose: 117 mg/dL — ABNORMAL HIGH (ref 65–99)
Potassium: 4 mmol/L (ref 3.5–5.2)
Sodium: 141 mmol/L (ref 134–144)

## 2019-10-17 LAB — SARS CORONAVIRUS 2 (TAT 6-24 HRS): SARS Coronavirus 2: NEGATIVE

## 2019-10-17 MED ORDER — PREDNISONE 50 MG PO TABS
ORAL_TABLET | ORAL | 0 refills | Status: DC
Start: 2019-10-17 — End: 2019-11-03

## 2019-10-17 NOTE — Telephone Encounter (Signed)
Erroneous encounter

## 2019-10-17 NOTE — Telephone Encounter (Signed)
Follow up   Patient's wife is returning your call. Please call. 

## 2019-10-17 NOTE — Telephone Encounter (Addendum)
Left a detailed message for the patient with the comments from Judy Pimple, PA-C and asked if he had any questions to give our office a call.   ----- Message from Beatriz Stallion, PA-C sent at 10/17/2019  8:40 AM EDT ----- See MyChart message, Hi Mr. Herbster - your lab work looks stable. We will proceed with your heart catheterization tomorrow as planned. Thanks!

## 2019-10-17 NOTE — Telephone Encounter (Signed)
Spoke with the pts wife and answered a few more questions but she had already had a throrough conversation with the Anne L. and felt comfortable with the pts plans for his heart cath in the morning.

## 2019-10-17 NOTE — Telephone Encounter (Deleted)
  ASA 81 mg   Confirmed patient has responsible adult to drive home post procedure and observe 24 hours after arriving home:   You are allowed ONE visitor in the waiting room during your procedure. Both you and your visitor must wear masks.      COVID-19 Pre-Screening Questions:  . In the past 7 to 10 days have you had a cough,  shortness of breath, headache, congestion, fever (100 or greater) body aches, chills, sore throat, or sudden loss of taste or sense of smell? . Have you been around anyone with known Covid 19 in the past 7 to 10 days? . Have you been around anyone who is awaiting Covid 19 test results in the past 7 to 10 days? . Have you been around anyone who has mentioned symptoms of Covid 19 within the past 7 to 10 days?

## 2019-10-17 NOTE — Telephone Encounter (Addendum)
Pt contacted pre-catheterization scheduled at [facility] for: Wednesday October 18, 2019 1 PM Verified arrival time and place: [facility] Main Entrance [facility]) at: 9 AM-pre procedure hydration   No solid food after midnight prior to cath, clear liquids until 5 AM day of procedure.  Contrast allergy: yes 13 hour Prednisone and Benadryl Prep reviewed with patient's wife (DPR): Prednisone 50 mg  10/17/19 midnight Prednisone 50 mg 10/18/19 6 AM Prednisone 50 mg and benadryl 50 mg-I have asked pt to take to hospital and take at 11 AM at hospital AM of procedure. I have asked pt to let nurses at Short Stay know that I have asked him to do this.  Hold: Eliquis-none 10/16/19 until post procedure-per pt  Except hold medications AM meds can be  taken pre-cath with sip of water including: ASA 81 mg   Confirmed patient has responsible adult to drive home post procedure and observe 24 hours after arriving home: yes   You are allowed ONE visitor in the waiting room during your procedure. Both you and your visitor must wear masks.      COVID-19 Pre-Screening Questions:  . In the past 7 to 10 days have you had a cough,  shortness of breath, headache, congestion, fever (100 or greater) body aches, chills, sore throat, or sudden loss of taste or sense of smell? Sinus congestion . Have you been around anyone with known Covid 19 in the past 7 to 10 days? no . Have you been around anyone who is awaiting Covid 19 test results in the past 7 to 10 days? no . Have you been around anyone who has mentioned symptoms of Covid 19 within the past 7 to 10 days? no   Reviewed procedure/mask/visitor instructions, COVID-19 screening questions, pre procedure hydration with patient's wife (DPR).

## 2019-10-18 ENCOUNTER — Encounter (HOSPITAL_COMMUNITY)
Admission: RE | Disposition: A | Discharge status: Home / Self Care | Payer: Self-pay | Source: Home / Self Care | Attending: Cardiovascular Disease

## 2019-10-18 ENCOUNTER — Ambulatory Visit (HOSPITAL_COMMUNITY)
Admission: RE | Admit: 2019-10-18 | Discharge: 2019-10-18 | Disposition: A | Discharge status: Home / Self Care | Payer: Medicare PPO | Attending: Cardiovascular Disease | Admitting: Cardiovascular Disease

## 2019-10-18 ENCOUNTER — Other Ambulatory Visit: Payer: Self-pay

## 2019-10-18 DIAGNOSIS — K219 Gastro-esophageal reflux disease without esophagitis: Secondary | ICD-10-CM | POA: Insufficient documentation

## 2019-10-18 DIAGNOSIS — E7800 Pure hypercholesterolemia, unspecified: Secondary | ICD-10-CM | POA: Insufficient documentation

## 2019-10-18 DIAGNOSIS — Z951 Presence of aortocoronary bypass graft: Secondary | ICD-10-CM | POA: Diagnosis not present

## 2019-10-18 DIAGNOSIS — I25119 Atherosclerotic heart disease of native coronary artery with unspecified angina pectoris: Secondary | ICD-10-CM | POA: Insufficient documentation

## 2019-10-18 DIAGNOSIS — Z79890 Hormone replacement therapy (postmenopausal): Secondary | ICD-10-CM | POA: Diagnosis not present

## 2019-10-18 DIAGNOSIS — Z79899 Other long term (current) drug therapy: Secondary | ICD-10-CM | POA: Insufficient documentation

## 2019-10-18 DIAGNOSIS — I701 Atherosclerosis of renal artery: Secondary | ICD-10-CM | POA: Insufficient documentation

## 2019-10-18 DIAGNOSIS — Z955 Presence of coronary angioplasty implant and graft: Secondary | ICD-10-CM | POA: Insufficient documentation

## 2019-10-18 DIAGNOSIS — I25708 Atherosclerosis of coronary artery bypass graft(s), unspecified, with other forms of angina pectoris: Secondary | ICD-10-CM

## 2019-10-18 DIAGNOSIS — I110 Hypertensive heart disease with heart failure: Secondary | ICD-10-CM | POA: Diagnosis not present

## 2019-10-18 DIAGNOSIS — N183 Chronic kidney disease, stage 3 unspecified: Secondary | ICD-10-CM | POA: Diagnosis not present

## 2019-10-18 DIAGNOSIS — Z7901 Long term (current) use of anticoagulants: Secondary | ICD-10-CM | POA: Insufficient documentation

## 2019-10-18 DIAGNOSIS — I2510 Atherosclerotic heart disease of native coronary artery without angina pectoris: Secondary | ICD-10-CM

## 2019-10-18 DIAGNOSIS — D509 Iron deficiency anemia, unspecified: Secondary | ICD-10-CM | POA: Insufficient documentation

## 2019-10-18 DIAGNOSIS — I480 Paroxysmal atrial fibrillation: Secondary | ICD-10-CM | POA: Insufficient documentation

## 2019-10-18 DIAGNOSIS — I25118 Atherosclerotic heart disease of native coronary artery with other forms of angina pectoris: Secondary | ICD-10-CM | POA: Diagnosis not present

## 2019-10-18 DIAGNOSIS — I350 Nonrheumatic aortic (valve) stenosis: Secondary | ICD-10-CM | POA: Insufficient documentation

## 2019-10-18 DIAGNOSIS — E785 Hyperlipidemia, unspecified: Secondary | ICD-10-CM | POA: Diagnosis not present

## 2019-10-18 HISTORY — PX: RIGHT/LEFT HEART CATH AND CORONARY/GRAFT ANGIOGRAPHY: CATH118267

## 2019-10-18 LAB — POCT I-STAT 7, (LYTES, BLD GAS, ICA,H+H)
Acid-base deficit: 2 mmol/L (ref 0.0–2.0)
Bicarbonate: 23.3 mmol/L (ref 20.0–28.0)
Calcium, Ion: 1.25 mmol/L (ref 1.15–1.40)
HCT: 35 % — ABNORMAL LOW (ref 39.0–52.0)
Hemoglobin: 11.9 g/dL — ABNORMAL LOW (ref 13.0–17.0)
O2 Saturation: 99 %
Potassium: 3.8 mmol/L (ref 3.5–5.1)
Sodium: 144 mmol/L (ref 135–145)
TCO2: 24 mmol/L (ref 22–32)
pCO2 arterial: 39.5 mmHg (ref 32.0–48.0)
pH, Arterial: 7.378 (ref 7.350–7.450)
pO2, Arterial: 129 mmHg — ABNORMAL HIGH (ref 83.0–108.0)

## 2019-10-18 LAB — POCT I-STAT EG7
Acid-Base Excess: 0 mmol/L (ref 0.0–2.0)
Acid-base deficit: 2 mmol/L (ref 0.0–2.0)
Acid-base deficit: 2 mmol/L (ref 0.0–2.0)
Bicarbonate: 23.1 mmol/L (ref 20.0–28.0)
Bicarbonate: 23.5 mmol/L (ref 20.0–28.0)
Bicarbonate: 25.5 mmol/L (ref 20.0–28.0)
Calcium, Ion: 1.25 mmol/L (ref 1.15–1.40)
Calcium, Ion: 1.26 mmol/L (ref 1.15–1.40)
Calcium, Ion: 1.27 mmol/L (ref 1.15–1.40)
HCT: 36 % — ABNORMAL LOW (ref 39.0–52.0)
HCT: 36 % — ABNORMAL LOW (ref 39.0–52.0)
HCT: 37 % — ABNORMAL LOW (ref 39.0–52.0)
Hemoglobin: 12.2 g/dL — ABNORMAL LOW (ref 13.0–17.0)
Hemoglobin: 12.2 g/dL — ABNORMAL LOW (ref 13.0–17.0)
Hemoglobin: 12.6 g/dL — ABNORMAL LOW (ref 13.0–17.0)
O2 Saturation: 73 %
O2 Saturation: 73 %
O2 Saturation: 80 %
Potassium: 3.8 mmol/L (ref 3.5–5.1)
Potassium: 3.9 mmol/L (ref 3.5–5.1)
Potassium: 3.9 mmol/L (ref 3.5–5.1)
Sodium: 145 mmol/L (ref 135–145)
Sodium: 145 mmol/L (ref 135–145)
Sodium: 146 mmol/L — ABNORMAL HIGH (ref 135–145)
TCO2: 24 mmol/L (ref 22–32)
TCO2: 25 mmol/L (ref 22–32)
TCO2: 27 mmol/L (ref 22–32)
pCO2, Ven: 40 mmHg — ABNORMAL LOW (ref 44.0–60.0)
pCO2, Ven: 42.7 mmHg — ABNORMAL LOW (ref 44.0–60.0)
pCO2, Ven: 45.7 mmHg (ref 44.0–60.0)
pH, Ven: 7.348 (ref 7.250–7.430)
pH, Ven: 7.354 (ref 7.250–7.430)
pH, Ven: 7.37 (ref 7.250–7.430)
pO2, Ven: 41 mmHg (ref 32.0–45.0)
pO2, Ven: 41 mmHg (ref 32.0–45.0)
pO2, Ven: 45 mmHg (ref 32.0–45.0)

## 2019-10-18 SURGERY — RIGHT/LEFT HEART CATH AND CORONARY/GRAFT ANGIOGRAPHY
Anesthesia: LOCAL

## 2019-10-18 MED ORDER — ACETAMINOPHEN 325 MG PO TABS: 650.0000 mg | ORAL_TABLET | ORAL | Status: DC | PRN

## 2019-10-18 MED ORDER — FENTANYL CITRATE (PF) 100 MCG/2ML IJ SOLN
INTRAMUSCULAR | Status: AC
  Filled 2019-10-18: qty 2

## 2019-10-18 MED ORDER — HYDRALAZINE HCL 20 MG/ML IJ SOLN: 10.0000 mg | INTRAMUSCULAR | Status: DC | PRN

## 2019-10-18 MED ORDER — ASPIRIN 81 MG PO CHEW
CHEWABLE_TABLET | ORAL | Status: AC
  Administered 2019-10-18: 81 mg via ORAL
  Filled 2019-10-18: qty 1

## 2019-10-18 MED ORDER — MIDAZOLAM HCL 2 MG/2ML IJ SOLN
INTRAMUSCULAR | Status: AC
  Filled 2019-10-18: qty 2

## 2019-10-18 MED ORDER — HEPARIN (PORCINE) IN NACL 1000-0.9 UT/500ML-% IV SOLN
INTRAVENOUS | Status: AC
  Filled 2019-10-18: qty 1000

## 2019-10-18 MED ORDER — HEPARIN (PORCINE) IN NACL 1000-0.9 UT/500ML-% IV SOLN
INTRAVENOUS | Status: DC | PRN
  Administered 2019-10-18 (×2): 500 mL

## 2019-10-18 MED ORDER — SODIUM CHLORIDE 0.9% FLUSH: 3.0000 mL | Freq: Two times a day (BID) | INTRAVENOUS | Status: DC

## 2019-10-18 MED ORDER — ATORVASTATIN CALCIUM 80 MG PO TABS
80.0000 mg | ORAL_TABLET | Freq: Every day | ORAL | Status: DC
Start: 2019-10-18 — End: 2019-10-18

## 2019-10-18 MED ORDER — SODIUM CHLORIDE 0.9 % IV SOLN: INTRAVENOUS | Status: DC

## 2019-10-18 MED ORDER — LABETALOL HCL 5 MG/ML IV SOLN: 10.0000 mg | INTRAVENOUS | Status: DC | PRN

## 2019-10-18 MED ORDER — ASPIRIN 81 MG PO CHEW: 81.0000 mg | CHEWABLE_TABLET | Freq: Once | ORAL | Status: AC

## 2019-10-18 MED ORDER — SODIUM CHLORIDE 0.9 % IV SOLN: 250.0000 mL | INTRAVENOUS | Status: DC | PRN

## 2019-10-18 MED ORDER — ONDANSETRON HCL 4 MG/2ML IJ SOLN: 4.0000 mg | Freq: Four times a day (QID) | INTRAMUSCULAR | Status: DC | PRN

## 2019-10-18 MED ORDER — SODIUM CHLORIDE 0.9 % WEIGHT BASED INFUSION: 1.0000 mL/kg/h | INTRAVENOUS | Status: DC

## 2019-10-18 MED ORDER — SODIUM CHLORIDE 0.9% FLUSH: 3.0000 mL | INTRAVENOUS | Status: DC | PRN

## 2019-10-18 MED ORDER — MIDAZOLAM HCL 2 MG/2ML IJ SOLN
INTRAMUSCULAR | Status: DC | PRN
  Administered 2019-10-18: 1 mg via INTRAVENOUS

## 2019-10-18 MED ORDER — FENTANYL CITRATE (PF) 100 MCG/2ML IJ SOLN
INTRAMUSCULAR | Status: DC | PRN
  Administered 2019-10-18: 25 ug via INTRAVENOUS

## 2019-10-18 MED ORDER — IOHEXOL 350 MG/ML SOLN
INTRAVENOUS | Status: AC
  Filled 2019-10-18: qty 1

## 2019-10-18 MED ORDER — IOHEXOL 350 MG/ML SOLN
INTRAVENOUS | Status: DC | PRN
  Administered 2019-10-18: 95 mL via INTRA_ARTERIAL

## 2019-10-18 MED ORDER — LIDOCAINE HCL (PF) 1 % IJ SOLN
INTRAMUSCULAR | Status: DC | PRN
  Administered 2019-10-18: 30 mL via INTRADERMAL

## 2019-10-18 MED ORDER — LIDOCAINE HCL (PF) 1 % IJ SOLN
INTRAMUSCULAR | Status: AC
  Filled 2019-10-18: qty 30

## 2019-10-18 MED ORDER — SODIUM CHLORIDE 0.9 % WEIGHT BASED INFUSION
3.0000 mL/kg/h | INTRAVENOUS | Status: AC
  Administered 2019-10-18: 3 mL/kg/h via INTRAVENOUS

## 2019-10-18 SURGICAL SUPPLY — 15 items
CATH INFINITI 5 FR IM (CATHETERS) ×2 IMPLANT
CATH INFINITI 5FR AL1 (CATHETERS) ×2 IMPLANT
CATH INFINITI 5FR JL5 (CATHETERS) ×2 IMPLANT
CATH INFINITI 5FR MULTPACK ANG (CATHETERS) ×2 IMPLANT
CATH SWAN GANZ 7F STRAIGHT (CATHETERS) ×2 IMPLANT
CLOSURE MYNX CONTROL 5F (Vascular Products) ×2 IMPLANT
KIT HEART LEFT (KITS) ×2 IMPLANT
PACK CARDIAC CATHETERIZATION (CUSTOM PROCEDURE TRAY) ×2 IMPLANT
SHEATH PINNACLE 5F 10CM (SHEATH) ×2 IMPLANT
SHEATH PINNACLE 7F 10CM (SHEATH) ×2 IMPLANT
TRANSDUCER W/STOPCOCK (MISCELLANEOUS) ×2 IMPLANT
TUBING CIL FLEX 10 FLL-RA (TUBING) ×2 IMPLANT
WIRE EMERALD 3MM-J .035X150CM (WIRE) ×2 IMPLANT
WIRE EMERALD 3MM-J .035X260CM (WIRE) ×2 IMPLANT
WIRE EMERALD ST .035X260CM (WIRE) ×2 IMPLANT

## 2019-10-18 NOTE — Progress Notes (Signed)
Ambulated in hallway tol well. No bleeding  Noted from groin before or after ambulation.

## 2019-10-18 NOTE — Interval H&P Note (Signed)
Cath Lab Visit (complete for each Cath Lab visit)  Clinical Evaluation Leading to the Procedure:   ACS: No.  Non-ACS:    Anginal Classification: CCS III  Anti-ischemic medical therapy: Maximal Therapy (2 or more classes of medications)  Non-Invasive Test Results: No non-invasive testing performed  Prior CABG: Previous CABG      History and Physical Interval Note:  10/18/2019 2:35 PM  Jorge Cherry  has presented today for surgery, with the diagnosis of Aortic stenosis.  The various methods of treatment have been discussed with the patient and family. After consideration of risks, benefits and other options for treatment, the patient has consented to  Procedure(s): RIGHT/LEFT HEART CATH AND CORONARY/GRAFT ANGIOGRAPHY (N/A) as a surgical intervention.  The patient's history has been reviewed, patient examined, no change in status, stable for surgery.  I have reviewed the patient's chart and labs.  Questions were answered to the patient's satisfaction.     Nicki Guadalajara

## 2019-10-18 NOTE — Discharge Instructions (Signed)
Femoral Site Care This sheet gives you information about how to care for yourself after your procedure. Your health care provider may also give you more specific instructions. If you have problems or questions, contact your health care provider. What can I expect after the procedure? After the procedure, it is common to have:  Bruising that usually fades within 1-2 weeks.  Tenderness at the site. Follow these instructions at home: Wound care  Follow instructions from your health care provider about how to take care of your insertion site. Make sure you: ? Wash your hands with soap and water before you change your bandage (dressing). If soap and water are not available, use hand sanitizer. ? Change your dressing as told by your health care provider. ? Leave stitches (sutures), skin glue, or adhesive strips in place. These skin closures may need to stay in place for 2 weeks or longer. If adhesive strip edges start to loosen and curl up, you may trim the loose edges. Do not remove adhesive strips completely unless your health care provider tells you to do that.  Do not take baths, swim, or use a hot tub until your health care provider approves.  You may shower 24-48 hours after the procedure or as told by your health care provider. ? Gently wash the site with plain soap and water. ? Pat the area dry with a clean towel. ? Do not rub the site. This may cause bleeding.  Do not apply powder or lotion to the site. Keep the site clean and dry.  Check your femoral site every day for signs of infection. Check for: ? Redness, swelling, or pain. ? Fluid or blood. ? Warmth. ? Pus or a bad smell. Activity  For the first 2-3 days after your procedure, or as long as directed: ? Avoid climbing stairs as much as possible. ? Do not squat.  Do not lift anything that is heavier than 10 lb (4.5 kg), or the limit that you are told, until your health care provider says that it is safe.  Rest as  directed. ? Avoid sitting for a long time without moving. Get up to take short walks every 1-2 hours.  Do not drive for 24 hours if you were given a medicine to help you relax (sedative). General instructions  Take over-the-counter and prescription medicines only as told by your health care provider.  Keep all follow-up visits as told by your health care provider. This is important. Contact a health care provider if you have:  A fever or chills.  You have redness, swelling, or pain around your insertion site. Get help right away if:  The catheter insertion area swells very fast.  You pass out.  You suddenly start to sweat or your skin gets clammy.  The catheter insertion area is bleeding, and the bleeding does not stop when you hold steady pressure on the area.  The area near or just beyond the catheter insertion site becomes pale, cool, tingly, or numb. These symptoms may represent a serious problem that is an emergency. Do not wait to see if the symptoms will go away. Get medical help right away. Call your local emergency services (911 in the [place] Do not drive yourself to the hospital. Summary  After the procedure, it is common to have bruising that usually fades within 1-2 weeks.  Check your femoral site every day for signs of infection.  Do not lift anything that is heavier than 10 lb (4.5 kg), or the   limit that you are told, until your health care provider says that it is safe. This information is not intended to replace advice given to you by your health care provider. Make sure you discuss any questions you have with your health care provider. Document Revised: 05/10/2017 Document Reviewed: 05/10/2017 Elsevier Patient Education  2020 [employer]  

## 2019-10-18 NOTE — Interval H&P Note (Signed)
History and Physical Interval Note:  10/18/2019 2:41 PM  Jorge Cherry  has presented today for surgery, with the diagnosis of Aortic stenosis.  The various methods of treatment have been discussed with the patient and family. After consideration of risks, benefits and other options for treatment, the patient has consented to  Procedure(s): RIGHT/LEFT HEART CATH AND CORONARY/GRAFT ANGIOGRAPHY (N/A) as a surgical intervention.  The patient's history has been reviewed, patient examined, no change in status, stable for surgery.  I have reviewed the patient's chart and labs.  Questions were answered to the patient's satisfaction.     Nicki Guadalajara

## 2019-10-18 NOTE — Progress Notes (Signed)
Discharge instructions reviewed with pt and his wife (via telephone) both voice understanding.  

## 2019-10-19 ENCOUNTER — Encounter (HOSPITAL_COMMUNITY): Payer: Self-pay | Admitting: Cardiovascular Disease

## 2019-10-27 ENCOUNTER — Encounter: Payer: Self-pay | Admitting: Cardiothoracic Surgery

## 2019-10-30 ENCOUNTER — Other Ambulatory Visit: Payer: Self-pay | Admitting: Allergy and Immunology

## 2019-10-31 ENCOUNTER — Ambulatory Visit: Payer: Medicare PPO | Admitting: Physician Assistant

## 2019-11-01 ENCOUNTER — Other Ambulatory Visit: Payer: Self-pay

## 2019-11-01 ENCOUNTER — Encounter: Payer: Self-pay | Admitting: Cardiovascular Disease

## 2019-11-01 ENCOUNTER — Ambulatory Visit: Payer: Medicare PPO | Admitting: Cardiovascular Disease

## 2019-11-01 VITALS — BP 150/70 | HR 58 | Ht 68.0 in | Wt 198.0 lb

## 2019-11-01 DIAGNOSIS — I350 Nonrheumatic aortic (valve) stenosis: Secondary | ICD-10-CM | POA: Diagnosis not present

## 2019-11-01 DIAGNOSIS — N183 Chronic kidney disease, stage 3 unspecified: Secondary | ICD-10-CM | POA: Diagnosis not present

## 2019-11-01 DIAGNOSIS — Z01812 Encounter for preprocedural laboratory examination: Secondary | ICD-10-CM

## 2019-11-01 LAB — BASIC METABOLIC PANEL
BUN/Creatinine Ratio: 14 (ref 10–24)
BUN: 19 mg/dL (ref 8–27)
CO2: 21 mmol/L (ref 20–29)
Calcium: 8.8 mg/dL (ref 8.6–10.2)
Chloride: 108 mmol/L — ABNORMAL HIGH (ref 96–106)
Creatinine, Ser: 1.37 mg/dL — ABNORMAL HIGH (ref 0.76–1.27)
GFR calc Af Amer: 56 mL/min/{1.73_m2} — ABNORMAL LOW (ref >59)
GFR calc non Af Amer: 48 mL/min/{1.73_m2} — ABNORMAL LOW (ref >59)
Glucose: 105 mg/dL — ABNORMAL HIGH (ref 65–99)
Potassium: 3.9 mmol/L (ref 3.5–5.2)
Sodium: 141 mmol/L (ref 134–144)

## 2019-11-01 NOTE — Patient Instructions (Signed)
Medication Instructions:  No changes today *If you need a refill on your cardiac medications before your next appointment, please call your pharmacy*   Lab Work: Today: BMET  If you have labs (blood work) drawn today and your tests are completely normal, you will receive your results only by:  MyChart Message (if you have MyChart) OR  A paper copy in the mail If you have any lab test that is abnormal or we need to change your treatment, we will call you to review the results.   Testing/Procedures: As planned   Other Instructions Julieta Gutting, RN Structural Heart Nurse Navigator will be in touch with you regarding plan for upcoming testing.

## 2019-11-01 NOTE — Progress Notes (Signed)
[facility] Clinic Consult Note  Chief Complaint  Patient presents with  . Follow-up    Severe aortic stenosis   History of Present Illness:83 yo male with history of CAD s/p CABG in 1994, PAF, HTN, HLD, GERD, CKD stage 3 and severe aortic stenosis who is here today as a new consult, referred by Dr. Claiborne Billings, for further evaluation of his severe aortic stenosis and discussion regarding TAVR. He is known to have CAD and had CABG in 1994. Cardiac cath 10/18/19 with total occlusion of all native vessels and 3 patent grafts (LIMA to LAD, SVG to OM, SVG to Diagonal), The vein graft to the RCA is occluded. Dr. Claiborne Billings and Dr. Burt Knack decided that medical management of his CAD was appropriate. Echo 08/14/19 with LVEF=55-60%, mild MR. The aortic valve is thickened and calcified with limited leaflet mobility, mean gradient 35 mmHg, peak gradient 67 mmHg, AVA 1.05 cm2, dimensionless index 0.30. He has had recent chest pain and dizziness. No syncope. He tells me today that he has progressive fatigue, dyspnea on exertion and LE edema. He lives with his wife. He has full dentures. He is retired from [employer].   Primary Care Physician: Prince Solian, MD Primary Cardiologist: Claiborne Billings Referring Cardiologist: Claiborne Billings  Past Medical History:  Diagnosis Date  . Arthritis    "just a touch in my hands" (11/24/2016)  . Asthma   . Atherosclerosis of renal artery ([place])    RENAL DOPPLER, 12/10/2011 - Left renal artery demonstrated narrowing with elevated velocities consistent with a 1-59% diameter reduction  . CKD (chronic kidney disease) stage 3, GFR 30-59 ml/min    "stable now since they backed off the water pills" (11/24/2016)  . Coronary artery disease    a. 1994 s/p CABG x 4 (LIMA-LAD, VG->D2, VG->OM, VG->RCA); b. 02/2004 PCI SVG-D2 (3.5x16 Taxus DES). VG->RCA 100. Sev apical LAD dzs distal to LIMA insertion; c. 11/2016 Cath: LAD 95p/160m, D2 100ost, LCX 100ost, 70p/m, RCA 100p/m, RPDA fills via L->R collats. VG->RCA 100,  VG->D2 20 ost, patent prox stent, 22m, LIMA->LAD ok, VG->OM3 20p. EF 55%-->Med Rx.  . GERD (gastroesophageal reflux disease)   . High cholesterol   . History of lower GI bleeding    a. 09/2014 GIB due to diverticulosis/diverticulitis.  . Iron deficiency anemia   . Labile Hypertension   . Moderate aortic stenosis    a. 10/2011 Echo:  EF >55%, mild-mod TR, mild-mod AS, mod Ca2+ of AoV leaflets; b. 07/2016 Echo: EF 60-65%, no rwma, Gr1 DD, mod AS [(S) mean grad 3mmHg, peak grad 85mmHg. Valve area (VTI): 1.33cm^2, (Vmax) 1.44cm^2. Mild MR]; c. 02/2018 Echo: EF 55-60%, no rwma, GR1 DD, Mod AS [Peak Vel (S): 319cm/s, Mean grad (S) 33mmHg, peak grad (S) 73mmHg].  [name] PAF (paroxysmal atrial fibrillation) ([facility])    a. CHA2DS2VASc = 4-->Eliquis.    Past Surgical History:  Procedure Laterality Date  . CARDIAC CATHETERIZATION  02/27/2004   Coronary intervention and medical management  . CARDIAC CATHETERIZATION  11/24/2016  . CATARACT EXTRACTION W/ INTRAOCULAR LENS IMPLANT Left   . CORONARY ANGIOPLASTY  1994 X 2   "before bypass surgery"  . CORONARY ANGIOPLASTY WITH STENT PLACEMENT  03/04/2004   SVG supplying the diagonal vessel stented with a 3.5x74mm Taxus stent post dilated to 4.0 mm  . CORONARY ARTERY BYPASS GRAFT  1994   "CABG X4"  . INGUINAL HERNIA REPAIR Right   . RIGHT HEART CATH AND CORONARY/GRAFT ANGIOGRAPHY N/A 11/24/2016   Procedure: Right Heart Cath and Coronary/Graft Angiography;  Surgeon: Lennette Bihari, MD;  Location: [facility] INVASIVE CV LAB;  Service: Cardiovascular;  Laterality: N/A;  . RIGHT/LEFT HEART CATH AND CORONARY/GRAFT ANGIOGRAPHY N/A 10/18/2019   Procedure: RIGHT/LEFT HEART CATH AND CORONARY/GRAFT ANGIOGRAPHY;  Surgeon: Lennette Bihari, MD;  Location: [facility] INVASIVE CV LAB;  Service: Cardiovascular;  Laterality: N/A;  . TONSILLECTOMY AND ADENOIDECTOMY      Current Outpatient Medications  Medication Sig Dispense Refill  . albuterol (VENTOLIN HFA) 108 (90 Base) MCG/ACT inhaler INHALE  TWO PUFFS EVERY 4-6 HOURS AS NEEDED FOR COUGH OR WHEEZE 8.5 g 1  . amiodarone (PACERONE) 200 MG tablet Take 1 tablet (200 mg total) by mouth daily. 90 tablet 3  . amLODipine (NORVASC) 2.5 MG tablet Take 1 tablet (2.5 mg total) by mouth 2 (two) times daily. NEED OV. 180 tablet 0  . atorvastatin (LIPITOR) 20 MG tablet Take 1 tablet (20 mg total) by mouth at bedtime. 30 tablet 0  . Azelastine-Fluticasone (DYMISTA) 137-50 MCG/ACT SUSP Place 1-2 sprays into the nose daily as needed (for seasonal allergies).    . docusate sodium (COLACE) 100 MG capsule Take 100 mg by mouth 2 (two) times daily.    [name] ELIQUIS 2.5 MG TABS tablet TAKE 1 TABLET BY MOUTH TWICE A DAY (Patient taking differently: Take 2.5 mg by mouth 2 (two) times daily. ) 180 tablet 1  . ezetimibe (ZETIA) 10 MG tablet TAKE 1 TABLET BY MOUTH EVERY DAY (Patient taking differently: Take 10 mg by mouth daily. TAKE 1 TABLET BY MOUTH EVERY DAY) 90 tablet 2  . fenofibrate (TRICOR) 145 MG tablet Take 0.5 tablets (72.5 mg total) by mouth daily. 90 tablet 1  . fluticasone (FLONASE) 50 MCG/ACT nasal spray Place 2 sprays into both nostrils daily.    . fluticasone (FLOVENT HFA) 110 MCG/ACT inhaler Inhale 1 puff into the lungs 3 (three) times daily as needed (for seasonal flares).    [name] ipratropium (ATROVENT) 0.06 % nasal spray Can use two sprays in each nostril every six hours as needed to dry up nose. (Patient taking differently: Place 2 sprays into both nostrils 3 (three) times daily. ) 15 mL 5  . isosorbide mononitrate (IMDUR) 60 MG 24 hr tablet TAKE 1 AND 1/2 TABS ( ) BY MOUTH EVERY MORNING AND 1/2 TAB EVERY EVENING (Patient taking differently: Take 30-90 mg by mouth See admin instructions. TAKE 1 AND 1/2 TABS ( ) BY MOUTH EVERY MORNING AND 1/2 TAB EVERY EVENING) 180 tablet 3  . levothyroxine (SYNTHROID) 25 MCG tablet Take 25 mcg by mouth daily before breakfast.     . loratadine (CLARITIN) 10 MG tablet Take 10 mg by mouth every evening.     . Multiple  Vitamins-Minerals (CENTRUM SILVER 50+MEN) TABS Take 1 tablet by mouth at bedtime.    . nitroGLYCERIN (NITROLINGUAL) 0.4 MG/SPRAY spray Place 1 spray under the tongue every 5 (five) minutes x 3 doses as needed for chest pain. 4.9 g 2  . Omega-3 Fatty Acids (FISH OIL) 1000 MG CAPS Take 1,000 mg by mouth 2 (two) times daily.     . pantoprazole (PROTONIX) 40 MG tablet TAKE 1 TABLET BY MOUTH EVERY DAY (Patient taking differently: Take 40 mg by mouth daily. ) 90 tablet 1  . predniSONE (DELTASONE) 50 MG tablet Take 1 tablet 13 hours (Midnight)  before cardiac catheterization. Take 1 tablet 7 hours (6AM) before cardiac catheterization. 2 tablet 0  . predniSONE (DELTASONE) 50 MG tablet Take by mouth at hospital 11 AM 10/18/19 with benadryl 50 mg 1  tablet 0  . ranolazine (RANEXA) 500 MG 12 hr tablet TAKE 1 TABLET BY MOUTH TWICE A DAY (Patient taking differently: Take 500 mg by mouth 2 (two) times daily. ) 180 tablet 3  . vitamin C (ASCORBIC ACID) 500 MG tablet Take 500 mg by mouth at bedtime.     . vitamin E 400 UNIT capsule Take 400 Units by mouth daily.    [name] zinc gluconate 50 MG tablet Take 50 mg by mouth 2 (two) times a week.     No current facility-administered medications for this visit.    Allergies  Allergen Reactions  . Altace [Ramipril] Swelling and Other (See Comments)    Mouth swelling  . Mucinex [Guaifenesin Er] Hives, Swelling and Other (See Comments)    Mouth swelling  . Contrast Media [Iodinated Diagnostic Agents] Other (See Comments)    Made eyes change each time    Social History   Socioeconomic History  . Marital status: Married    Spouse name: Not on file  . Number of children: 2  . Years of education: Not on file  . Highest education level: Not on file  Occupational History  . Not on file  Tobacco Use  . Smoking status: Never Smoker  . Smokeless tobacco: Never Used  Vaping Use  . Vaping Use: Never used  Substance and Sexual Activity  . Alcohol use: No    Alcohol/week:  0.0 standard drinks  . Drug use: No  . Sexual activity: Not Currently  Other Topics Concern  . Not on file  Social History Narrative   Lives at home in [place] with wife.  Retired from [employer].     Social Determinants of Health   Financial Resource Strain:   . Difficulty of Paying Living Expenses:   Food Insecurity:   . Worried About Programme researcher, broadcasting/film/video in the Last Year:   . Barista in the Last Year:   Transportation Needs:   . Freight forwarder (Medical):   [name] Lack of Transportation (Non-Medical):   Physical Activity:   . Days of Exercise per Week:   . Minutes of Exercise per Session:   Stress:   . Feeling of Stress :   Social Connections:   . Frequency of Communication with Friends and Family:   . Frequency of Social Gatherings with Friends and Family:   . Attends Religious Services:   . Active Member of Clubs or Organizations:   . Attends Banker Meetings:   [name] Marital Status:   Intimate Partner Violence:   . Fear of Current or Ex-Partner:   . Emotionally Abused:   [name] Physically Abused:   . Sexually Abused:     Family History  Problem Relation Age of Onset  . Cancer Mother   . Heart disease Father        died age 1  . Stroke Maternal Grandmother   . Cancer Maternal Grandfather   . Allergic rhinitis Neg Hx   . Angioedema Neg Hx   . Asthma Neg Hx   . Atopy Neg Hx   . Eczema Neg Hx   . Immunodeficiency Neg Hx   . Urticaria Neg Hx     Review of Systems:  As stated in the HPI and otherwise negative.   BP (!) 150/70   Pulse (!) 58   Ht 5\' 8"  (1.727 m)   Wt 198 lb (89.8 kg)   SpO2 97%   BMI 30.11 kg/m  Physical Examination: General: Well developed, well nourished, NAD  HEENT: OP clear, mucus membranes moist  SKIN: warm, dry. No rashes. Neuro: No focal deficits  Musculoskeletal: Muscle strength 5/5 all ext  Psychiatric: Mood and affect normal  Neck: No JVD, no carotid bruits, no thyromegaly, no  lymphadenopathy.  Lungs:Clear bilaterally, no wheezes, rhonci, crackles Cardiovascular: Regular rate and rhythm. Loud, harsh, late peaking systolic murmur.  Abdomen:Soft. Bowel sounds present. Non-tender.  Extremities: Trace bilateral lower extremity edema. Pulses are 2 + in the bilateral DP/PT.  Echo April 2021:  1. Left ventricular ejection fraction, by estimation, is 55 to 60%. The  left ventricle has normal function. The left ventricle has no regional  wall motion abnormalities. Left ventricular diastolic parameters are  consistent with Grade II diastolic  dysfunction (pseudonormalization).  2. Right ventricular systolic function is normal. The right ventricular  size is normal.  3. The mitral valve is normal in structure. Mild mitral valve  regurgitation. No evidence of mitral stenosis.  4. Tricuspid valve regurgitation is moderate.  5. The aortic valve is tricuspid. Aortic valve regurgitation is not  visualized. Moderate to severe aortic valve stenosis. Aortic valve mean  gradient measures 35.0 mmHg.   FINDINGS  Left Ventricle: Left ventricular ejection fraction, by estimation, is 55  to 60%. The left ventricle has normal function. The left ventricle has no  regional wall motion abnormalities. The left ventricular internal cavity  size was normal in size. There is  borderline left ventricular hypertrophy. Left ventricular diastolic  parameters are consistent with Grade II diastolic dysfunction  (pseudonormalization).   Right Ventricle: The right ventricular size is normal. No increase in  right ventricular wall thickness. Right ventricular systolic function is  normal.   Left Atrium: Left atrial size was normal in size.   Right Atrium: Right atrial size was normal in size.   Pericardium: There is no evidence of pericardial effusion.   Mitral Valve: The mitral valve is normal in structure. Mild mitral valve  regurgitation. No evidence of mitral valve stenosis.    Tricuspid Valve: The tricuspid valve is grossly normal. Tricuspid valve  regurgitation is moderate . No evidence of tricuspid stenosis.   Aortic Valve: The aortic valve is tricuspid. . There is mild thickening  and moderate calcification of the aortic valve. Aortic valve regurgitation  is not visualized. Moderate to severe aortic stenosis is present. Severe  aortic valve annular  calcification. There is mild thickening of the aortic valve. There is  moderate calcification of the aortic valve. Aortic valve mean gradient  measures 35.0 mmHg. Aortic valve peak gradient measures 67.1 mmHg. Aortic  valve area, by VTI measures 1.05 cm.   Pulmonic Valve: The pulmonic valve was grossly normal. Pulmonic valve  regurgitation is not visualized. No evidence of pulmonic stenosis.   Aorta: The aortic root and ascending aorta are structurally normal, with  no evidence of dilitation.   IAS/Shunts: The atrial septum is grossly normal.     LEFT VENTRICLE  PLAX 2D  LVIDd:     5.90 cm Diastology  LVIDs:     4.30 cm LV e' lateral:  5.44 cm/s  LV PW:     1.50 cm LV E/e' lateral: 16.8  LV IVS:    1.10 cm LV e' medial:  6.09 cm/s  LVOT diam:   2.10 cm LV E/e' medial: 15.0  LV SV:     100  LV SV Index:  49  LVOT Area:   3.46 cm  RIGHT VENTRICLE  RV S prime:   4.35 cm/s  TAPSE (M-mode): 1.9 cm   LEFT ATRIUM       Index    RIGHT ATRIUM      Index  LA diam:    4.10 cm 2.03 cm/m RA Area:   14.90 cm  LA Vol (A2C):  73.3 ml 36.21 ml/m RA Volume:  30.20 ml 14.92 ml/m  LA Vol (A4C):  62.6 ml 30.92 ml/m  LA Biplane Vol: 70.2 ml 34.67 ml/m  AORTIC VALVE  AV Area (Vmax):  1.04 cm  AV Area (Vmean):  1.01 cm  AV Area (VTI):   1.05 cm  AV Vmax:      409.50 cm/s  AV Vmean:     264.000 cm/s  AV VTI:      0.951 m  AV Peak Grad:   67.1 mmHg  AV Mean Grad:   35.0 mmHg  LVOT Vmax:     123.00 cm/s   LVOT Vmean:    77.200 cm/s  LVOT VTI:     0.289 m  LVOT/AV VTI ratio: 0.30    AORTA  Ao Root diam: 4.30 cm   MITRAL VALVE  MV Area (PHT): 4.57 cm  SHUNTS  MV Decel Time: 166 msec  Systemic VTI: 0.29 m  MV E velocity: 91.30 cm/s Systemic Diam: 2.10 cm  MV A velocity: 94.30 cm/s  MV E/A ratio: 0.97   Cardiac cath June 2021:  Left Anterior Descending  Prox LAD to Mid LAD lesion is 100% stenosed with 100% stenosed side branch in 1st Diag.  Left Circumflex  Ost Cx to Prox Cx lesion is 100% stenosed.  Right Coronary Artery  Ost RCA to Dist RCA lesion is 100% stenosed.  Right Posterior Descending Artery  Collaterals  RPDA filled by collaterals from Dist Cx.    Graft To RPDA  Origin lesion is 100% stenosed.  Graft To 2nd Diag  Previously placed Origin to Prox Graft stent (unknown type) is widely patent.  Prox Graft to Mid Graft lesion is 40% stenosed.  Dist Graft lesion is 70% stenosed.  Graft To 2nd Mrg  LIMA Graft To Mid LAD  Intervention  No interventions have been documented. Right Heart  Right Heart Pressures RA: A-wave 6, V wave 7; mean 4 RV: 37/8 PA: 30/11; mean 24 PW: A-wave 15,  V wave 21; mean 15  Initial AO 162/81          LV: 206/20  Pullback: LV: 207/21 AO: 164/84  O2 saturation in the pulmonary artery 73% and in the central aorta 99%.  Cardiac output by the thermodilution method was 6.5 L/min and by the Fick method 6.4 L/min. Cardiac index by the thermodilution method was 3.2 L/min/m and by the Fick method 3.1 L/min/m.  PVR: 1.42 WU  Aortic valve: Peak to peak gradient 43, mean gradient 41, valve area 1.0 cm  Coronary Diagrams  Diagnostic Dominance: Right  Intervention  Implants   Vascular Products  Closure Mynx Control 55f - [id number] - Implanted Inventory item: CLOSURE [facility] CONTROL 22F Model/Cat number: [id number]  Manufacturer: [employer] DIV [employer]JP Lot number: [id number]  Device identifier: [id number] Device  identifier type: GS1  GUDID Information  Request status Successful    Brand name: [facility] CONTROL Version/Model: OV5643  Company name: [employer] MRI safety info as of 10/18/19: MR Safe  Contains dry or latex rubber: No    GMDN P.T. name: Wound hydrogel dressing, non-antimicrobial    As of 10/18/2019  Status: Implanted      Syngo Images  Show images for CARDIAC CATHETERIZATION Images on Long Term Storage  Show images for Flanery, Treasure S Link to Procedure Log  Procedure Log    Hemo Data   Most Recent Value  Fick Cardiac Output 6.35 L/min  Fick Cardiac Output Index 3.13 (L/min)/BSA  Thermal Cardiac Output 6.49 L/min  Thermal Cardiac Output Index 3.2 (L/min)/BSA  Aortic Mean Gradient 40.5 mmHg  Aortic Peak Gradient 43 mmHg  Aortic Valve Area 1.00  Aortic Value Area Index 0.49 cm2/BSA  RA A Wave 6 mmHg  RA V Wave 7 mmHg  RA Mean 4 mmHg  RV Systolic Pressure 37 mmHg  RV Diastolic Pressure 1 mmHg  RV EDP 8 mmHg  PA Systolic Pressure 30 mmHg  PA Diastolic Pressure 11 mmHg  PA Mean 24 mmHg  PW A Wave 15 mmHg  PW V Wave 21 mmHg  PW Mean 15 mmHg  AO Systolic Pressure 162 mmHg  AO Diastolic Pressure 81 mmHg  AO Mean 114 mmHg  LV Systolic Pressure 205 mmHg  LV Diastolic Pressure 13 mmHg  LV EDP 20 mmHg  AOp Systolic Pressure 164 mmHg  AOp Diastolic Pressure 84 mmHg  AOp Mean Pressure 117 mmHg  LVp Systolic Pressure 207 mmHg  LVp Diastolic Pressure 13 mmHg  LVp EDP Pressure 21 mmHg  TPVR Index 7.5 HRUI  TSVR Index 35.62 HRUI  PVR SVR Ratio 0.08  TPVR/TSVR Ratio 0.21     Recent Labs: 10/16/2019: BUN 17; Creatinine, Ser 1.35; Platelets 156 10/18/2019: Hemoglobin 11.9; Potassium 3.8; Sodium 144    Wt Readings from Last 3 Encounters:  11/01/19 198 lb (89.8 kg)  10/18/19 195 lb (88.5 kg)  10/13/19 197 lb 6.4 oz (89.5 kg)     Other studies Reviewed: Additional studies/ records that were reviewed today include: Echo images, cath images. Office notes.  Review of  the above records demonstrates: severe AS   Assessment and Plan:   1. Severe Aortic Valve Stenosis: He has severe, stage D aortic valve stenosis. I have personally reviewed the echo images. The aortic valve is thickened, calcified with limited leaflet mobility. I think he would benefit from AVR. Given advanced age, he is not a good candidate for conventional AVR by surgical approach. I think he may be a good candidate for TAVR.   STS Risk Score: Risk of Mortality: 2.498% Renal Failure: 3.466% Permanent Stroke: 1.626% Prolonged Ventilation: 9.722% DSW Infection: 0.172% Reoperation: 4.597% Morbidity or Mortality: 15.465% Short Length of Stay: 20.908% Long Length of Stay: 9.223%   I have reviewed the natural history of aortic stenosis with the patient and their family members  who are present today. We have discussed the limitations of medical therapy and the poor prognosis associated with symptomatic aortic stenosis. We have reviewed potential treatment options, including palliative medical therapy, conventional surgical aortic valve replacement, and transcatheter aortic valve replacement. We discussed treatment options in the context of the patient's specific comorbid medical conditions.   He would like to proceed with planning for TAVR. Risks and benefits of the valve procedure are reviewed with the patient. Will arrange a cardiac CT, CTA of the chest/abdomen and pelvis, carotid artery dopplers and PT assessment and he will then be referred to see one of the CT surgeons on our TAVR team. He will need a BMET today and pre-treatment prior to CT scans and TAVR due to contrast allergy.      Current medicines are reviewed at length with the  patient today.  The patient does not have concerns regarding medicines.  The following changes have been made:  no change  Labs/ tests ordered today include:   Orders Placed This Encounter  Procedures  . Basic metabolic panel      Disposition:   FU with the valve team.    Signed, Verne Carrow, MD 11/01/2019 1:34 PM    [facility] [address] [place], [place], [place]  [address] Phone: [phone]; Fax: [phone]

## 2019-11-02 ENCOUNTER — Other Ambulatory Visit: Payer: Self-pay

## 2019-11-02 DIAGNOSIS — I350 Nonrheumatic aortic (valve) stenosis: Secondary | ICD-10-CM

## 2019-11-03 ENCOUNTER — Other Ambulatory Visit: Payer: Self-pay

## 2019-11-03 DIAGNOSIS — Z01810 Encounter for preprocedural cardiovascular examination: Secondary | ICD-10-CM

## 2019-11-03 MED ORDER — PREDNISONE 50 MG PO TABS: ORAL_TABLET | ORAL | 0 refills | Status: DC

## 2019-11-06 ENCOUNTER — Other Ambulatory Visit: Payer: Self-pay

## 2019-11-06 ENCOUNTER — Ambulatory Visit (HOSPITAL_COMMUNITY)
Admission: RE | Admit: 2019-11-06 | Discharge: 2019-11-06 | Disposition: A | Discharge status: Home / Self Care | Payer: Medicare PPO | Source: Ambulatory Visit | Attending: Cardiovascular Disease | Admitting: Cardiovascular Disease

## 2019-11-06 DIAGNOSIS — Z01818 Encounter for other preprocedural examination: Secondary | ICD-10-CM | POA: Diagnosis not present

## 2019-11-06 DIAGNOSIS — I700 Atherosclerosis of aorta: Secondary | ICD-10-CM | POA: Diagnosis not present

## 2019-11-06 DIAGNOSIS — I350 Nonrheumatic aortic (valve) stenosis: Secondary | ICD-10-CM

## 2019-11-06 IMAGING — CT CT ANGIO CHEST
3 of 4 series · 15 of 30 positions shown · IV contrast (APPLIED)
Comparison: No priors.

CLINICAL DATA: 83-year-old male with history of severe aortic
stenosis. Preprocedural study prior to potential transcatheter
aortic valve replacement (TAVR) procedure.

EXAM:
CT ANGIOGRAPHY CHEST, ABDOMEN AND PELVIS
TECHNIQUE: Non-contrast CT of the chest was initially obtained.

[Series 8: 0-90% · axial · 0.63mm/px · z∈[-144,-49]mm · 4 of 2551 slices shown]
[im 319/2551  lung]
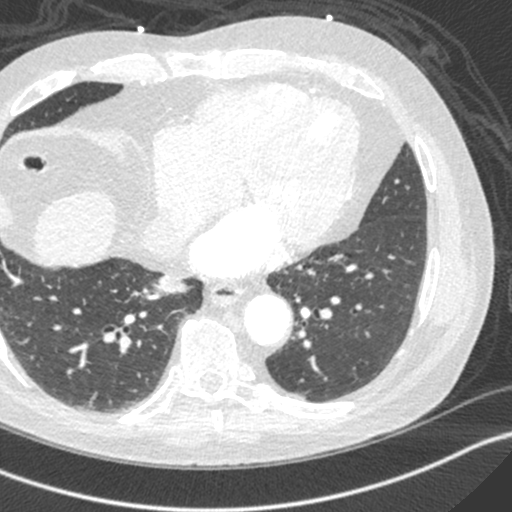
[im 638/2551  lung]
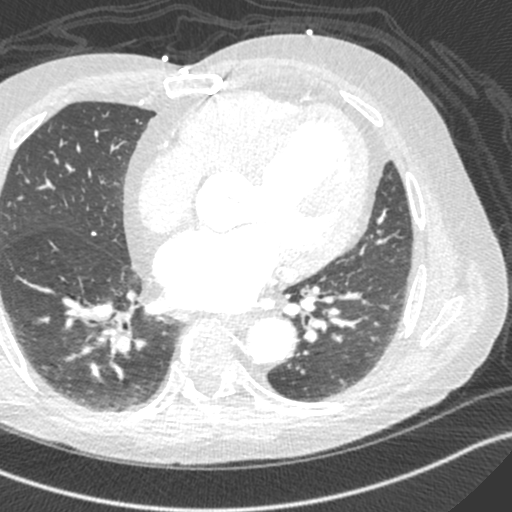
[im 957/2551  lung]
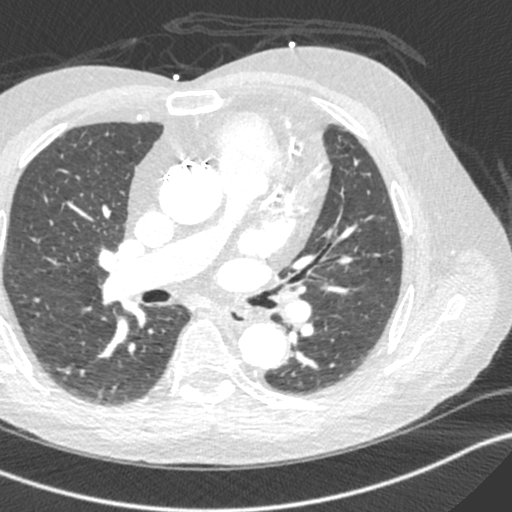
[im 1276/2551  lung]
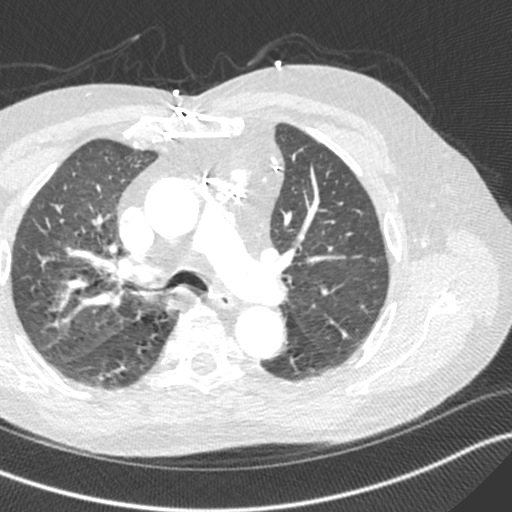

[Series 10: 5-95% · axial · 0.63mm/px · z∈[-159,+68]mm · 8 of 4370 slices shown]
[im 292/4370  lung]
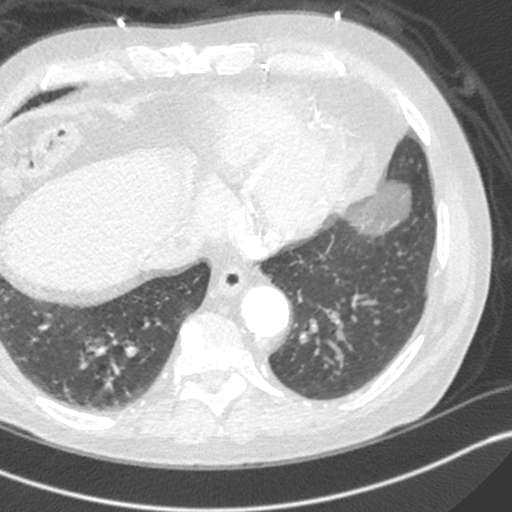
[im 874/4370  lung]
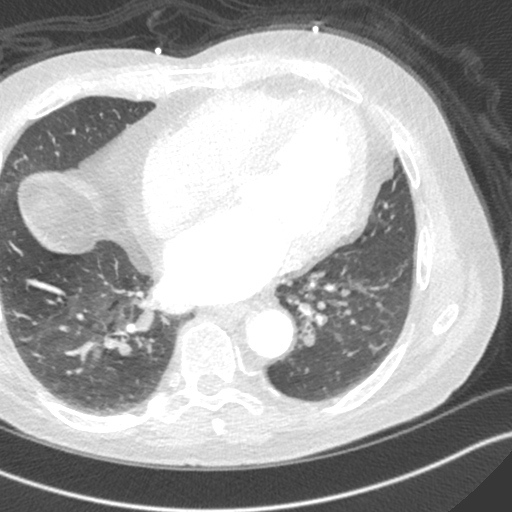
[im 1457/4370  lung]
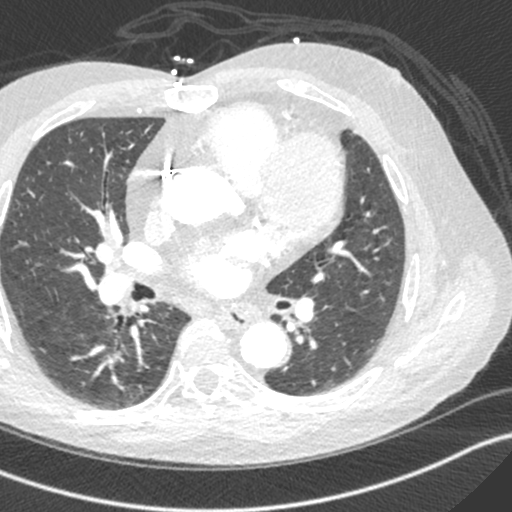
[im 2039/4370  lung]
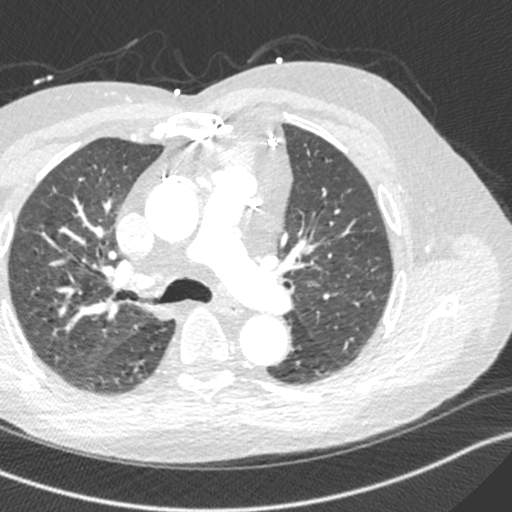
[im 2331/4370  lung]
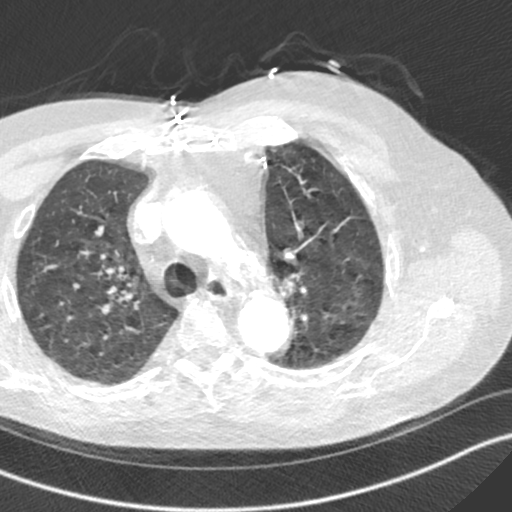
[im 2913/4370  lung]
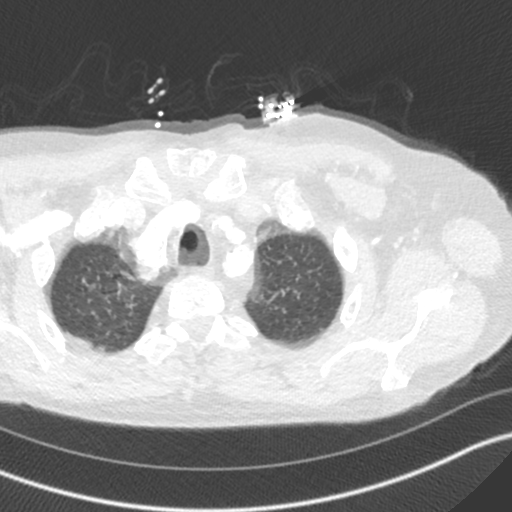
[im 3496/4370  lung]
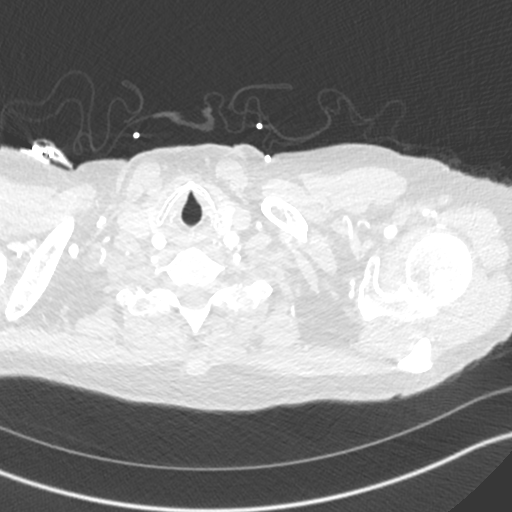
[im 4078/4370  lung]
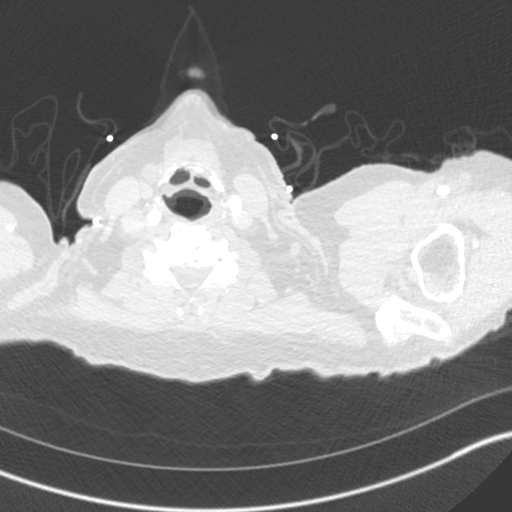

[Series 15: ax thins · axial · 0.59mm/px · z∈[-593,+40]mm · 3 of 634 slices shown]
[im 1/634  lung]
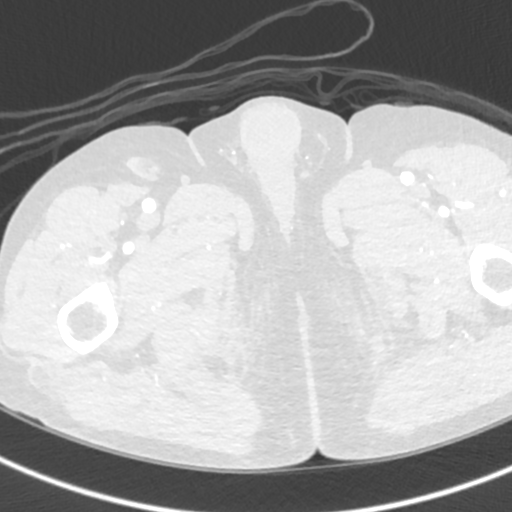
[im 331/634  mediastinal]
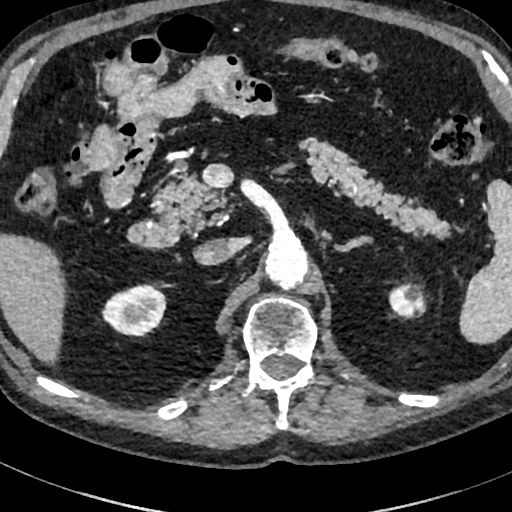
[im 634/634  lung]
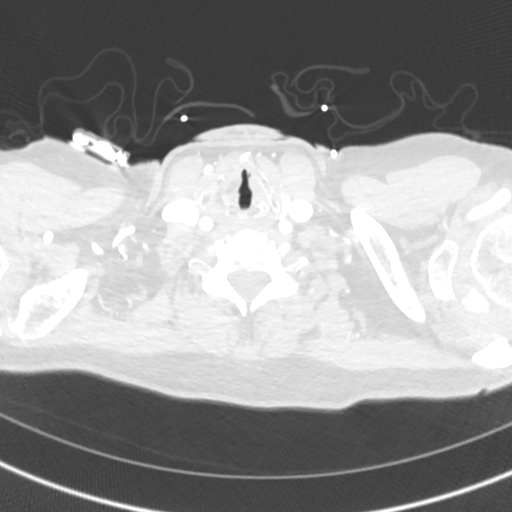

[15 of 30 positions shown; findings below may reference images not displayed]

Multidetector CT imaging through the chest, abdomen and pelvis was
performed using the standard protocol during bolus administration of
intravenous contrast. Multiplanar reconstructed images and MIPs were
obtained and reviewed to evaluate the vascular anatomy.

CONTRAST:  100mL OMNIPAQUE IOHEXOL 350 MG/ML SOLN
FINDINGS: CTA CHEST FINDINGS

Cardiovascular: Heart size is normal. There is no significant
pericardial fluid, thickening or pericardial calcification. There is
aortic atherosclerosis, as well as atherosclerosis of the great
vessels of the mediastinum and the coronary arteries, including
calcified atherosclerotic plaque in the left main, left anterior
descending, left circumflex and right coronary arteries. Status post
median sternotomy for CABG including [REDACTED] to the LAD. Severe
thickening calcification of the aortic valve.

Mediastinum/Lymph Nodes: No pathologically enlarged mediastinal or
hilar lymph nodes. Esophagus is unremarkable in appearance. No
axillary lymphadenopathy.

Lungs/Pleura: No suspicious appearing pulmonary nodules or masses
are noted. No acute consolidative airspace disease. No pleural
effusions.

Musculoskeletal/Soft Tissues: Median sternotomy wires. There are no
aggressive appearing lytic or blastic lesions noted in the
visualized portions of the skeleton.

CTA ABDOMEN AND PELVIS FINDINGS

Hepatobiliary: No suspicious cystic or solid hepatic lesions. No
intra or extrahepatic biliary ductal dilatation. Gallbladder is
normal in appearance.

Pancreas: No pancreatic mass. No pancreatic ductal dilatation. No
pancreatic or peripancreatic fluid collections or inflammatory
changes.

Spleen: Unremarkable.

Adrenals/Urinary Tract: Multiple small nonobstructive calculi are
noted within the collecting systems of both kidneys measuring up to
4 mm in the lower pole collecting system of the right kidney.
Multiple small renal lesions are noted bilaterally, most of which
are too small to definitively characterize. Several of the larger
lesions are intermediate to higher attenuation, and are concerning
for potential enhancing lesions, but are incompletely characterized
on today's study. The most concerning of these is in the interpolar
region of the left kidney (axial image 120 of series 16) measuring
2.2 x 1.9 cm (72 HU). No hydroureteronephrosis. Urinary bladder is
normal in appearance. Bilateral adrenal glands are normal in
appearance.

Stomach/Bowel: Normal appearance of the stomach. No pathologic
dilatation of small bowel or colon. Numerous colonic diverticulae
are noted, particularly in the sigmoid colon, without surrounding
inflammatory changes to suggest an acute diverticulitis at this
time. The appendix is not confidently identified and may be
surgically absent. Regardless, there are no inflammatory changes
noted adjacent to the cecum to suggest the presence of an acute
appendicitis at this time.

Vascular/Lymphatic: Aortic atherosclerosis, with vascular findings
and measurements pertinent to potential TAVR procedure, as detailed
below. Short-segment non propagating dissections of the right
external iliac and left common iliac arteries (discussed below).

Reproductive: Prostate gland and seminal vesicles are unremarkable
in appearance.

Other: No significant volume of ascites.  No pneumoperitoneum.

Musculoskeletal: Chronic appearing compression fractures of L1 and
L2 with approximately 30% loss of anterior vertebral body height at
both levels. 5 mm sclerotic lesion with narrow zone of transition in
the left sacral ala, likely to represent a bone island. There are no
other more aggressive appearing lytic or blastic lesions noted in
the visualized portions of the skeleton.

VASCULAR MEASUREMENTS PERTINENT TO TAVR:

AORTA:

Minimal Aortic [9Z] x 12 mm

Severity of Aortic Calcification-severe

RIGHT PELVIS:

Right Common Iliac Artery -

Minimal [9Z] x 10.4 mm

Tortuosity-moderate

Calcification-moderate

Right External Iliac Artery -

Minimal [9Z] x 8.1 mm

Tortuosity-severe

Calcification-mild

Comment: Short segment dissection of the distal right external iliac
artery.

Right Common Femoral Artery -

Minimal [9Z] x 7.7 mm

Tortuosity-mild

Calcification-mild-to-moderate

LEFT PELVIS:

Left Common Iliac Artery -

Minimal [9Z] x 6.8 mm

Tortuosity-moderate

Calcification-moderate

Comment: Short segment dissection of the proximal left common iliac
artery.

Left External Iliac Artery -

Minimal [9Z] x 9.0 mm

Tortuosity-severe

Calcification-mild

Left Common Femoral Artery -

Minimal [9Z] x 7.4 mm

Tortuosity-mild

Calcification-mild

Review of the MIP images confirms the above findings.
IMPRESSION: 1. Vascular findings and measurements pertinent to potential TAVR
procedure, as detailed above. In particular, please take note of the
short segment dissections in the right external iliac and left
common iliac arteries.
2. Severe thickening calcification of the aortic valve, compatible
with the reported clinical history of severe aortic stenosis.
3. Aortic atherosclerosis, in addition to left main and 3 vessel
coronary artery disease. Status post median sternotomy for CABG
including [REDACTED] to the LAD.
4. Multiple indeterminate lesions in the kidneys, most concerning of
which measures 2.2 x 1.9 cm in the interpolar region of the left
kidney. Further characterization with nonemergent abdominal MRI with
and without IV gadolinium is strongly recommended in the near future
to exclude the possibility of underlying renal neoplasm.
5. Nonobstructive calculi in the collecting systems of both kidneys
measuring up to 4 mm in the lower pole collecting system of the
right kidney.
6. Colonic diverticulosis without evidence of acute diverticulitis
at this time.
7. Additional incidental findings, as above.

## 2019-11-06 IMAGING — CT CT HEART MORP W/ CTA COR W/ SCORE W/ CA W/CM &/OR W/O CM
2 of 11 series · 7 of 20 positions shown, 8 images · IV contrast (APPLIED)
Comparison: None.
COMPARISON: None.

Addendum:
EXAM:
OVER-READ INTERPRETATION  CT CHEST

The following report is an over-read performed by radiologist Dr.
LALEHAN [REDACTED] on [DATE]. This
over-read does not include interpretation of cardiac or coronary
anatomy or pathology. The coronary calcium score/coronary CTA
interpretation by the cardiologist is attached.
CLINICAL DATA: 83-year-old male with severe aortic stenosis being
evaluated for a TAVR procedure.
Cardiac TAVR CT
TECHNIQUE: The patient was scanned on a Phillips Force scanner. A 120 kV
retrospective scan was triggered in the descending thoracic aorta at
111 HU's. Gantry rotation speed was 250 msecs and collimation was .6
mm. No beta blockade or nitro were given. The 3D data set was
reconstructed in 5% intervals of the R-R cycle. Systolic and
diastolic phases were analyzed on a dedicated work station using
MPR, MIP and VRT modes. The patient received 80 cc of contrast.

[Series 8: 0-90% · axial · 0.63mm/px · z∈[-111,+20]mm · 3 of 4370 slices shown]
[im 1093/4370  vessel]
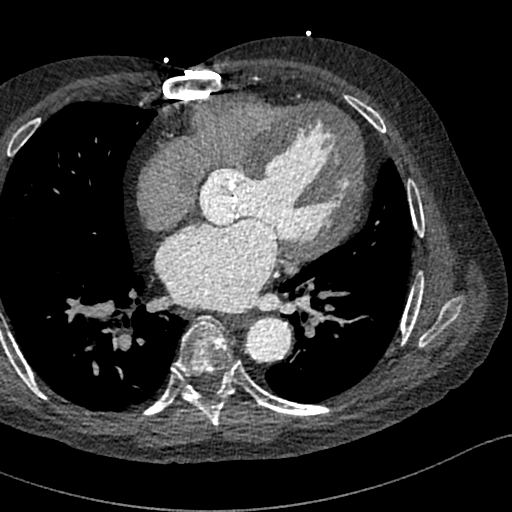
[im 2185/4370  vessel]
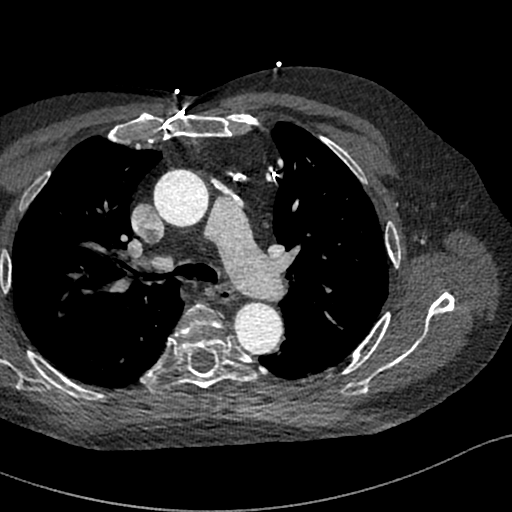
[im 3277/4370  vessel]
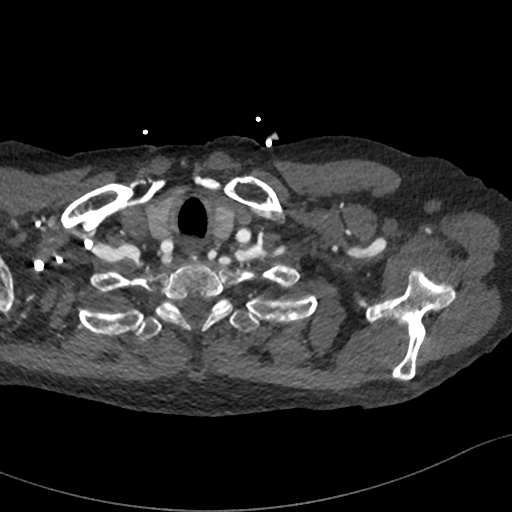

[Series 10: 5-95% · axial · 0.63mm/px · z∈[-124,+33]mm · 4 of 4370 slices shown, 5 images]
[im 874/4370  vessel]
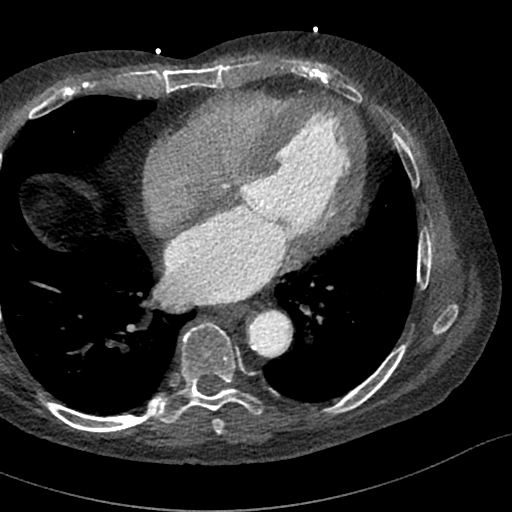
[im 874/4370  lung]
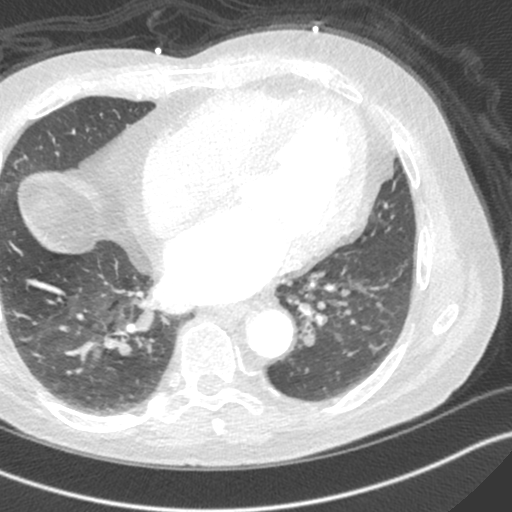
[im 1748/4370  vessel]
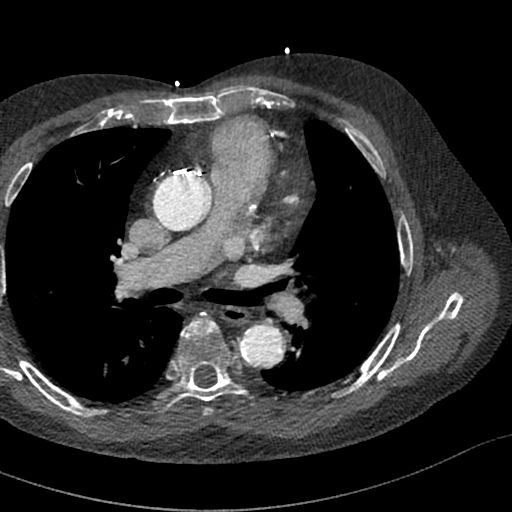
[im 2622/4370  vessel]
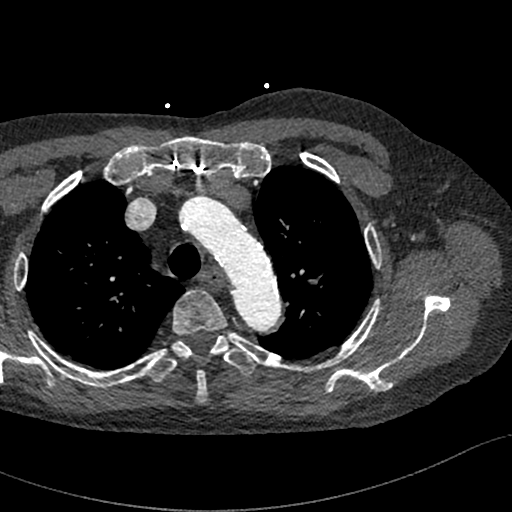
[im 3496/4370  vessel]
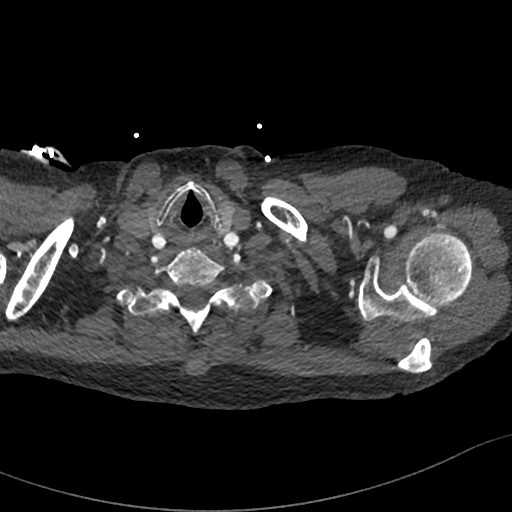

[7 of 20 positions shown; findings below may reference images not displayed]

FINDINGS: Extracardiac findings will be described separately under dictation
for contemporaneously obtained CTA chest, abdomen and pelvis.
IMPRESSION: Please see separate dictation for contemporaneously obtained CTA
chest, abdomen and pelvis dated [DATE] for full description of
relevant extracardiac findings.
FINDINGS: Aortic Valve: Trileaflet aortic valve with severely calcified
leaflet with severe motion restriction and only minimal
calcifications extending into the LVOT.

Aorta: Normal size of the thoracic aorta with moderate diffuse
atherosclerotic plaque and calcifications and no dissection.

Sinotubular Junction: 34 x 30 mm

Ascending Thoracic Aorta: 37 x 35 mm

Aortic Arch: 26 x 23 mm

Descending Thoracic Aorta: 28 x 27 mm

Sinus of Valsalva Measurements:

Non-coronary: 38 mm

Right -coronary: 36 mm

Left -coronary: 37 mm

Coronary Artery Height above Annulus:

Left Main: 15 mm

Right Coronary: 15 mm

Virtual Basal Annulus Measurements:

Maximum/Minimum Diameter: 29.3 x 23.4 mm

Mean Diameter: 25.6 mm

Perimeter: 83.1 mm

Area: 514 mm2

Optimum Fluoroscopic Angle for Delivery: LAO 2 LALEHAN 1
IMPRESSION: 1. Trileaflet aortic valve with severely calcified leaflet with
severe motion restriction and only minimal calcifications extending
into the LVOT. Annular measurements suitable for delivery of a 26 mm
Edwards-SAPIEN 3 Ultra valve. Aortic valve calcium score [WS]
consistent with severe aortic stenosis.

2. Sufficient coronary to annulus distance.

3. Optimum Fluoroscopic Angle for Delivery: LAO 2 LALEHAN 1

4. No thrombus in the left atrial appendage.

*** End of Addendum ***
EXAM:
OVER-READ INTERPRETATION  CT CHEST

The following report is an over-read performed by radiologist Dr.
LALEHAN [REDACTED] on [DATE]. This
over-read does not include interpretation of cardiac or coronary
anatomy or pathology. The coronary calcium score/coronary CTA
interpretation by the cardiologist is attached.
FINDINGS: Extracardiac findings will be described separately under dictation
for contemporaneously obtained CTA chest, abdomen and pelvis.
IMPRESSION: Please see separate dictation for contemporaneously obtained CTA
chest, abdomen and pelvis dated [DATE] for full description of
relevant extracardiac findings.

## 2019-11-06 MED ORDER — IOHEXOL 350 MG/ML SOLN
100.0000 mL | Freq: Once | INTRAVENOUS | Status: AC | PRN
  Administered 2019-11-06: 100 mL via INTRAVENOUS

## 2019-11-08 ENCOUNTER — Encounter (HOSPITAL_COMMUNITY): Payer: Medicare PPO

## 2019-11-08 ENCOUNTER — Other Ambulatory Visit: Payer: Self-pay

## 2019-11-08 DIAGNOSIS — I350 Nonrheumatic aortic (valve) stenosis: Secondary | ICD-10-CM

## 2019-11-08 DIAGNOSIS — Z01810 Encounter for preprocedural cardiovascular examination: Secondary | ICD-10-CM

## 2019-11-08 MED ORDER — PREDNISONE 50 MG PO TABS: ORAL_TABLET | ORAL | 0 refills | Status: DC

## 2019-11-09 ENCOUNTER — Ambulatory Visit: Payer: Medicare PPO | Attending: Cardiovascular Disease | Admitting: Physical Therapy

## 2019-11-09 ENCOUNTER — Other Ambulatory Visit: Payer: Self-pay

## 2019-11-09 ENCOUNTER — Encounter: Payer: Self-pay | Admitting: Physical Therapy

## 2019-11-09 ENCOUNTER — Encounter: Payer: Self-pay | Admitting: Surgery

## 2019-11-09 ENCOUNTER — Institutional Professional Consult (permissible substitution): Payer: Medicare PPO | Admitting: Surgery

## 2019-11-09 ENCOUNTER — Encounter: Payer: Medicare PPO | Admitting: Surgery

## 2019-11-09 VITALS — BP 153/75 | HR 67 | Temp 98.7°F | Resp 20 | Ht 68.0 in | Wt 194.4 lb

## 2019-11-09 DIAGNOSIS — I350 Nonrheumatic aortic (valve) stenosis: Secondary | ICD-10-CM | POA: Diagnosis not present

## 2019-11-09 DIAGNOSIS — R2689 Other abnormalities of gait and mobility: Secondary | ICD-10-CM | POA: Diagnosis not present

## 2019-11-09 NOTE — Progress Notes (Signed)
Your procedure is scheduled on Tuesday July 6.  Report to [facility] Main Entrance "A" at 05:30 A.M., and check in at the Admitting office.  Call this number if you have problems the morning of surgery: [phone]  Call [phone] if you have any questions prior to your surgery date Monday-Friday 8am-4pm   Remember: Do not eat or drink after midnight the night before your surgery   Per your surgeon's orders:   Stop taking Eliquis on 7/1. You will take your last dose on 6/30 (Wednesday).    Continue taking all other medications without change through the day before surgery.    On the morning of surgery DO NOT take any medications.    Prednisone 50 mg - take 13 hours prior to surgery (6:30 PM Monday)    Take another Prednisone 50 mg 7 hours prior to surgery (12:30 AM Tuesday)  IF NEEDED: albuterol (VENTOLIN HFA) inhaler - Please bring all inhalers with you the day of surgery.  fluticasone (FLOVENT HFA) inhaler   As of today, STOP taking any Aspirin containing products, Aleve, Naproxen, Ibuprofen, Motrin, Advil, Goody's, BC's, all herbal medications, fish oil, and all vitamins.    The Morning of Surgery  Do not wear jewelry  Do not wear lotions, powders, colognes, or deodorant Men may shave face and neck.  Do not bring valuables to the hospital.  [facility] is not responsible for any belongings or valuables.  If you are a smoker, DO NOT Smoke 24 hours prior to surgery  If you wear a CPAP at night please bring your mask the morning of surgery   Remember that you must have someone to transport you home after your surgery, and remain with you for 24 hours if you are discharged the same day.   Please bring cases for contacts, glasses, hearing aids, dentures or bridgework because it cannot be worn into surgery.    Leave your suitcase in the car.  After surgery it may be brought to your room.  For patients admitted to the hospital, discharge time will be  determined by your treatment team.  Patients discharged the day of surgery will not be allowed to drive home.    Special instructions:   [place]- Preparing For Surgery  Before surgery, you can play an important role. Because skin is not sterile, your skin needs to be as free of germs as possible. You can reduce the number of germs on your skin by washing with CHG (chlorahexidine gluconate) Soap before surgery.  CHG is an antiseptic cleaner which kills germs and bonds with the skin to continue killing germs even after washing.    Oral Hygiene is also important to reduce your risk of infection.  Remember - BRUSH YOUR TEETH THE MORNING OF SURGERY WITH YOUR REGULAR TOOTHPASTE  Please do not use if you have an allergy to CHG or antibacterial soaps. If your skin becomes reddened/irritated stop using the CHG.  Do not shave (including legs and underarms) for at least 48 hours prior to first CHG shower. It is OK to shave your face.  Please follow these instructions carefully.   1. Shower the NIGHT BEFORE SURGERY and the MORNING OF SURGERY with CHG Soap.   2. If you chose to wash your hair and body, wash as usual with your normal shampoo and body-wash/soap.  3. Rinse your hair and body thoroughly to remove the shampoo and soap.  4. Apply CHG directly to the skin (ONLY FROM THE NECK DOWN)  and wash gently with a scrungie or a clean washcloth.   5. Do not use on open wounds or open sores. Avoid contact with your eyes, ears, mouth and genitals (private parts). Wash Face and genitals (private parts)  with your normal soap.   6. Wash thoroughly, paying special attention to the area where your surgery will be performed.  7. Thoroughly rinse your body with warm water from the neck down.  8. DO NOT shower/wash with your normal soap after using and rinsing off the CHG Soap.  9. Pat yourself dry with a CLEAN TOWEL.  10. Wear CLEAN PAJAMAS to bed the night before surgery  11. Place CLEAN SHEETS on  your bed the night of your first shower and DO NOT SLEEP WITH PETS.  12. Wear comfortable clothes the morning of surgery.     Day of Surgery:  Please shower the morning of surgery with the CHG soap Do not apply any deodorants/lotions. Please wear clean clothes to the hospital/surgery center.   Remember to brush your teeth WITH YOUR REGULAR TOOTHPASTE.   Please read over the following fact sheets that you were given.

## 2019-11-09 NOTE — Therapy (Signed)
[facility] [address] [place], [place], [address] Phone: [phone]   Fax:  [phone]  Physical Therapy Evaluation  Patient Details  Name: Jorge Cherry MRN: [id number] Date of Birth: Feb 13, 1936 Referring Provider (PT): Dr Rochel Brome    Encounter Date: 11/09/2019   PT End of Session - 11/09/19 1538    Visit Number 1    Number of Visits 1    Date for PT Re-Evaluation 11/09/19    PT Start Time 1456    PT Stop Time 1525    PT Time Calculation (min) 29 min    Activity Tolerance Patient tolerated treatment well    Behavior During Therapy [facility] for tasks assessed/performed           Past Medical History:  Diagnosis Date  . Arthritis    "just a touch in my hands" (11/24/2016)  . Asthma   . Atherosclerosis of renal artery (HCC)    RENAL DOPPLER, 12/10/2011 - Left renal artery demonstrated narrowing with elevated velocities consistent with a 1-59% diameter reduction  . CKD (chronic kidney disease) stage 3, GFR 30-59 ml/min    "stable now since they backed off the water pills" (11/24/2016)  . Coronary artery disease    a. 1994 s/p CABG x 4 (LIMA-LAD, VG->D2, VG->OM, VG->RCA); b. 02/2004 PCI SVG-D2 (3.5x16 Taxus DES). VG->RCA 100. Sev apical LAD dzs distal to LIMA insertion; c. 11/2016 Cath: LAD 95p/187m, D2 100ost, LCX 100ost, 70p/m, RCA 100p/m, RPDA fills via L->R collats. VG->RCA 100, VG->D2 20 ost, patent prox stent, 63m, LIMA->LAD ok, VG->OM3 20p. EF 55%-->Med Rx.  . GERD (gastroesophageal reflux disease)   . High cholesterol   . History of lower GI bleeding    a. 09/2014 GIB due to diverticulosis/diverticulitis.  . Iron deficiency anemia   . Labile Hypertension   . Moderate aortic stenosis    a. 10/2011 Echo:  EF >55%, mild-mod TR, mild-mod AS, mod Ca2+ of AoV leaflets; b. 07/2016 Echo: EF 60-65%, no rwma, Gr1 DD, mod AS [(S) mean grad , peak grad . Valve area (VTI): 1.33cm^2, (Vmax) 1.44cm^2. Mild MR]; c.  02/2018 Echo: EF 55-60%, no rwma, GR1 DD, Mod AS [Peak Vel (S): 319cm/s, Mean grad (S) , peak grad (S) 48mmHg].  [name] PAF (paroxysmal atrial fibrillation) (HCC)    a. CHA2DS2VASc = 4-->Eliquis.    Past Surgical History:  Procedure Laterality Date  . CARDIAC CATHETERIZATION  02/27/2004   Coronary intervention and medical management  . CARDIAC CATHETERIZATION  11/24/2016  . CATARACT EXTRACTION W/ INTRAOCULAR LENS IMPLANT Left   . CORONARY ANGIOPLASTY  1994 X 2   "before bypass surgery"  . CORONARY ANGIOPLASTY WITH STENT PLACEMENT  03/04/2004   SVG supplying the diagonal vessel stented with a 3.5x15mm Taxus stent post dilated to 4.0 mm  . CORONARY ARTERY BYPASS GRAFT  1994   "CABG X4"  . INGUINAL HERNIA REPAIR Right   . RIGHT HEART CATH AND CORONARY/GRAFT ANGIOGRAPHY N/A 11/24/2016   Procedure: Right Heart Cath and Coronary/Graft Angiography;  Surgeon: Lennette Bihari, MD;  Location: [facility] INVASIVE CV LAB;  Service: Cardiovascular;  Laterality: N/A;  . RIGHT/LEFT HEART CATH AND CORONARY/GRAFT ANGIOGRAPHY N/A 10/18/2019   Procedure: RIGHT/LEFT HEART CATH AND CORONARY/GRAFT ANGIOGRAPHY;  Surgeon: Lennette Bihari, MD;  Location: [facility] INVASIVE CV LAB;  Service: Cardiovascular;  Laterality: N/A;  . TONSILLECTOMY AND ADENOIDECTOMY      There were no vitals filed for this visit.    Subjective Assessment - 11/09/19 1501  Subjective For about 2 years the patient has noticed shortness walking along with angina at times.    Pertinent History CKD, cardiac cath, aortic stenosis, low back pain    Currently in Pain? --   has intemittent back pain             [facility] PT Assessment - 11/09/19 0001      Assessment   Medical Diagnosis Severe Aortic Stenosis     Referring Provider (PT) Dr Rochel Brome     Onset Date/Surgical Date --   2 years prior    Hand Dominance Right    Next MD Visit Nothing today     Prior Therapy None      Precautions   Precautions None      Restrictions    Weight Bearing Restrictions No      Balance Screen   Has the patient fallen in the past 6 months Yes   mostly stumbles. has had low blood pressure    How many times? >5     Has the patient had a decrease in activity level because of a fear of falling?  No    Is the patient reluctant to leave their home because of a fear of falling?  No      Home Environment   Additional Comments Steps inside the house       Prior Function   Level of Independence Independent    Vocation Retired    Leisure likes to do Medical illustrator   Overall Cognitive Status Within Functional Limits for tasks assessed    Attention Focused    Focused Attention Appears intact    Memory Appears intact    Awareness Appears intact    Problem Solving Appears intact      Observation/Other Assessments   Focus on Therapeutic Outcomes (FOTO)  1x visit       ROM / Strength   AROM / PROM / Strength AROM;PROM;Strength      AROM   Overall AROM Comments full active ROM  of UE/LE       Strength   Overall Strength Comments 5/5 gorss UE/LE     Strength Assessment Site Hand    Right/Left hand Right;Left    Right Hand Grip (lbs) 60    Left Hand Grip (lbs) 50            [facility] Pre-Surgical Assessment - 11/09/19 0001    5 Meter Walk Test- trial 1 7 sec    5 Meter Walk Test- trial 2 7 sec.     5 Meter Walk Test- trial 3 6 sec.    5 meter walk test average 6.67 sec    4 Stage Balance Test tolerated for:  10 sec.    4 Stage Balance Test Position 3    comment unable to stand on 1 foot on the left. Patienreported back pain trying. Back pain resolved     Sit To Stand Test- trial 1 5 sec.    Comment 15 sec     ADL/IADL Independent with: Bathing;Dressing;Meal prep;Finances;Yard work    6 Minute Walk- Baseline yes    BP (mmHg) 149/72    HR (bpm) 76    02 Sat (%RA) 96 %    Modified Borg Scale for Dyspnea 0- Nothing at all    Perceived Rate of Exertion (Borg) 6-    6 Minute Walk Post Test yes    BP (mmHg) 141/86  HR (bpm) 89    02 Sat (%RA) 96 %    Modified Borg Scale for Dyspnea 3- Moderate shortness of breath or breathing difficulty    Perceived Rate of Exertion (Borg) 15- Hard    Aerobic Endurance Distance Walked 775    Endurance additional comments 43% disability                     Objective measurements completed on examination: See above findings.                            Plan - 11/09/19 1539    Clinical Impression Statement See below    Personal Factors and Comorbidities Comorbidity 1    Comorbidities severe aortic stenosis, low back pain    Examination-Activity Limitations Stand;Stairs;Locomotion Level    Examination-Participation Restrictions Laundry;Volunteer;Shop    Stability/Clinical Decision Making Stable/Uncomplicated    Clinical Decision Making Low    Rehab Potential Excellent    PT Frequency One time visit    PT Treatment/Interventions Patient/family education    Consulted and Agree with Plan of Care Patient           Patient will benefit from skilled therapeutic intervention in order to improve the following deficits and impairments:  Difficulty walking, Decreased activity tolerance  Visit Diagnosis: Other abnormalities of gait and mobility   Clinical Impression Statement: Pt is a 84 yo male presenting to OP PT for evaluation prior to possible TAVR surgery due to severe aortic stenosis. Pt reports onset of dyspnea and angina 24  months ago approximately  months ago. Symptoms are limiting ability to ambulate. Pt presents with decreased ROM and strength, decreased balance and is  at high fall risk 4 stage balance test, decreased walking speed and decreased aerobic endurance per 6 minute walk test. Pt ambulated 775 feet in 6 min At time of rest, patient's HR was 89 bpm and O2 was 96 on room air. Pt reported 3/10 shortness of breath on modified scale for dyspnea.. B/P increased significantly with 6 minute walk test. Based on the Short  Physical Performance Battery, patient has a frailty rating of 9/12 with </= 5/12 considered frail.    Problem List Patient Active Problem List   Diagnosis Date Noted  . Acute blood loss anemia 03/12/2018  . Bradycardia 03/03/2018  . Chronic anticoagulation 12/14/2017  . Mild intermittent asthma without complication 07/22/2017  . LPRD (laryngopharyngeal reflux disease) 07/22/2017  . Anemia 11/25/2016  . Coronary artery disease 11/24/2016  . Exertional angina (HCC)   . Chronic diastolic heart failure (HCC) 05/26/2016  . Junctional (nodal) bradycardia 11/20/2015  . Paroxysmal atrial fibrillation (HCC) 11/20/2015  . Hyperlipidemia LDL goal <70 05/03/2015  . Mild persistent asthma 03/19/2015  . Allergic rhinitis 03/19/2015  . Gastroesophageal reflux disease 03/19/2015  . Essential hypertension 02/07/2015  . Chest pain 01/22/2015  . Pain in the chest   . Chronic renal insufficiency, stage III (moderate) (HCC) 12/28/2014  . Lower GI bleed 09/24/2014  . Diverticulosis of colon with hemorrhage 09/24/2014  . Symptomatic anemia 09/24/2014  . Aortic valve stenosis 07/22/2013  . Hyperlipidemia with target LDL less than 70 10/27/2012  . Edema 10/27/2012  . Hearing decreased 10/27/2012  . Hx of CABG 10/27/2012  . Coronary atherosclerosis of native coronary artery 10/27/2012  . Benign hypertensive heart disease without heart failure 10/27/2012    Dessie Comaavid J Yatzari Jonsson PT DPT  11/09/2019, 3:47 PM  [facility] [place], [place], [address] Phone: [phone]   Fax:  [phone]  Name: Jorge Cherry MRN: [id number] Date of Birth: 1935/10/26

## 2019-11-09 NOTE — Progress Notes (Signed)
Patient ID: Jorge Cherry, male   DOB: Dec 06, 1935, 84 y.o.   MRN: [id number]  [facility]  MULTIDISCIPLINARY HEART VALVE CLINIC  CARDIOTHORACIC SURGERY CONSULTATION REPORT  Referring Provider is Lennette Bihari, MD Primary Cardiologist is Nicki Guadalajara, MD PCP is Chilton Greathouse, MD  Chief Complaint  Patient presents with  . Aortic Stenosis    Surgical eval for TAVR, review all studies     HPI:  The patient is an 84 year old gentleman with a history of hypertension, hyperlipidemia, coronary artery disease status post CABG by Dr. Tyrone Sage in 1994, paroxysmal atrial fibrillation on Eliquis, stage III chronic kidney disease, and severe aortic stenosis who presents with progressive fatigue and dyspnea on exertion.  Is also has bilateral lower extremity edema and some dizziness.  He reports some exertional chest discomfort.  An echocardiogram on 08/14/2019 showed a calcified and thickened aortic valve with restricted mobility.  The mean gradient was 35 mmHg with a peak gradient of 67 mmHg and an aortic valve area of 1 cm.  Dimensionless index of 0.3.  Left ventricular ejection fraction 55 to 60% with mild mitral regurgitation.  The patient is here today with his wife whom he lives with.  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  Past Medical History:  Diagnosis Date  . Arthritis    "just a touch in my hands" (11/24/2016)  . Asthma   . Atherosclerosis of renal artery (HCC)    RENAL DOPPLER, 12/10/2011 - Left renal artery demonstrated narrowing with elevated velocities consistent with a 1-59% diameter reduction  . CKD (chronic kidney disease) stage 3, GFR 30-59 ml/min    "stable now since they backed off the water pills" (11/24/2016)  . Coronary artery disease    a. 1994 s/p CABG x 4 (LIMA-LAD, VG->D2, VG->OM, VG->RCA); b. 02/2004 PCI SVG-D2 (3.5x16 Taxus DES). VG->RCA 100. Sev apical LAD dzs distal to LIMA insertion; c. 11/2016 Cath: LAD 95p/172m, D2 100ost, LCX 100ost,  70p/m, RCA 100p/m, RPDA fills via L->R collats. VG->RCA 100, VG->D2 20 ost, patent prox stent, 49m, LIMA->LAD ok, VG->OM3 20p. EF 55%-->Med Rx.  . GERD (gastroesophageal reflux disease)   . High cholesterol   . History of lower GI bleeding    a. 09/2014 GIB due to diverticulosis/diverticulitis.  . Iron deficiency anemia   . Labile Hypertension   . Moderate aortic stenosis    a. 10/2011 Echo:  EF >55%, mild-mod TR, mild-mod AS, mod Ca2+ of AoV leaflets; b. 07/2016 Echo: EF 60-65%, no rwma, Gr1 DD, mod AS [(S) mean grad , peak grad . Valve area (VTI): 1.33cm^2, (Vmax) 1.44cm^2. Mild MR]; c. 02/2018 Echo: EF 55-60%, no rwma, GR1 DD, Mod AS [Peak Vel (S): 319cm/s, Mean grad (S) , peak grad (S) 2mmHg].  [name] PAF (paroxysmal atrial fibrillation) (HCC)    a. CHA2DS2VASc = 4-->Eliquis.    Past Surgical History:  Procedure Laterality Date  . CARDIAC CATHETERIZATION  02/27/2004   Coronary intervention and medical management  . CARDIAC CATHETERIZATION  11/24/2016  . CATARACT EXTRACTION W/ INTRAOCULAR LENS IMPLANT Left   . CORONARY ANGIOPLASTY  1994 X 2   "before bypass surgery"  . CORONARY ANGIOPLASTY WITH STENT PLACEMENT  03/04/2004   SVG supplying the diagonal vessel stented with a 3.5x46mm Taxus stent post dilated to 4.0 mm  . CORONARY ARTERY BYPASS GRAFT  1994   "CABG X4"  . INGUINAL HERNIA REPAIR Right   . RIGHT HEART CATH AND CORONARY/GRAFT ANGIOGRAPHY N/A 11/24/2016   Procedure:  Right Heart Cath and Coronary/Graft Angiography;  Surgeon: Lennette BihariKelly, Thomas A, MD;  Location[facility];  Service: Cardiovascular;  Laterality: N/A;  . RIGHT/LEFT HEART CATH AND CORONARY/GRAFT ANGIOGRAPHY N/A 10/18/2019   Procedure: RIGHT/LEFT HEART CATH AND CORONARY/GRAFT ANGIOGRAPHY;  Surgeon: Lennette BihariKelly, Thomas A, MD;  Location: [facility];  Service: Cardiovascular;  Laterality: N/A;  . TONSILLECTOMY AND ADENOIDECTOMY      Family History  Problem Relation Age of Onset  . Cancer Mother   .  Heart disease Father        died age 82  . Stroke Maternal Grandmother   . Cancer Maternal Grandfather   . Allergic rhinitis Neg Hx   . Angioedema Neg Hx   . Asthma Neg Hx   . Atopy Neg Hx   . Eczema Neg Hx   . Immunodeficiency Neg Hx   . Urticaria Neg Hx     Social History   Socioeconomic History  . Marital status: Married    Spouse name: Not on file  . Number of children: 2  . Years of education: Not on file  . Highest education level: Not on file  Occupational History  . Not on file  Tobacco Use  . Smoking status: Never Smoker  . Smokeless tobacco: Never Used  Vaping Use  . Vaping Use: Never used  Substance and Sexual Activity  . Alcohol use: No    Alcohol/week: 0.0 standard drinks  . Drug use: No  . Sexual activity: Not Currently  Other Topics Concern  . Not on file  Social History Narrative   [place]  Retired from [employer].     Social Determinants of Health   Financial Resource Strain:   . Difficulty of Paying Living Expenses:   Food Insecurity:   . Worried About Programme researcher, broadcasting/film/videounning Out of Food in the Last Year:   . Baristaan Out of Food in the Last Year:   Transportation Needs:   . Freight forwarderLack of Transportation (Medical):   [name]. Lack of Transportation (Non-Medical):   Physical Activity:   . Days of Exercise per Week:   . Minutes of Exercise per Session:   Stress:   . Feeling of Stress :   Social Connections:   . Frequency of Communication with Friends and Family:   . Frequency of Social Gatherings with Friends and Family:   . Attends Religious Services:   . Active Member of Clubs or Organizations:   . Attends BankerClub or Organization Meetings:   [name]. Marital Status:   Intimate Partner Violence:   . Fear of Current or Ex-Partner:   . Emotionally Abused:   [name]. Physically Abused:   . Sexually Abused:     Current Outpatient Medications  Medication Sig Dispense Refill  . albuterol (VENTOLIN HFA) 108 (90 Base) MCG/ACT inhaler INHALE TWO  PUFFS EVERY 4-6 HOURS AS NEEDED FOR COUGH OR WHEEZE (Patient taking differently: Inhale 2 puffs into the lungs every 4 (four) hours as needed for wheezing or shortness of breath. ) 8.5 g 1  . amiodarone (PACERONE) 200 MG tablet Take 1 tablet (200 mg total) by mouth daily. 90 tablet 3  . amLODipine (NORVASC) 2.5 MG tablet Take 1 tablet (2.5 mg total) by mouth 2 (two) times daily. NEED OV. 180 tablet 0  . atorvastatin (LIPITOR) 20 MG tablet Take 1 tablet (20 mg total) by mouth at bedtime. 30 tablet 0  . Azelastine-Fluticasone (DYMISTA) 137-50 MCG/ACT SUSP Place 1-2 sprays into  the nose daily as needed (for seasonal allergies).    . docusate sodium (COLACE) 100 MG capsule Take 100 mg by mouth daily.     [name] ELIQUIS 2.5 MG TABS tablet TAKE 1 TABLET BY MOUTH TWICE A DAY 180 tablet 1  . ezetimibe (ZETIA) 10 MG tablet TAKE 1 TABLET BY MOUTH EVERY DAY (Patient taking differently: Take 10 mg by mouth daily. TAKE 1 TABLET BY MOUTH EVERY DAY) 90 tablet 2  . fenofibrate (TRICOR) 145 MG tablet Take 0.5 tablets (72.5 mg total) by mouth daily. 90 tablet 1  . fluticasone (FLONASE) 50 MCG/ACT nasal spray Place 2 sprays into both nostrils daily.     . fluticasone (FLOVENT HFA) 110 MCG/ACT inhaler Inhale 1 puff into the lungs 3 (three) times daily as needed (for seasonal flares).     [name] ipratropium (ATROVENT) 0.06 % nasal spray Can use two sprays in each nostril every six hours as needed to dry up nose. (Patient taking differently: Place 2 sprays into both nostrils 3 (three) times daily. ) 15 mL 5  . isosorbide mononitrate (IMDUR) 60 MG 24 hr tablet TAKE 1 AND 1/2 TABS (90MG ) BY MOUTH EVERY MORNING AND 1/2 TAB EVERY EVENING (Patient taking differently: Take 30-90 mg by mouth See admin instructions. TAKE 1 AND 1/2 TABS (90MG ) BY MOUTH EVERY MORNING AND 1/2 TAB EVERY EVENING) 180 tablet 3  . levothyroxine (SYNTHROID) 25 MCG tablet Take 25 mcg by mouth daily before breakfast.     . loratadine (CLARITIN) 10 MG tablet Take 10 mg  by mouth every evening.     . Multiple Vitamins-Minerals (CENTRUM SILVER 50+MEN) TABS Take 1 tablet by mouth at bedtime.    . nitroGLYCERIN (NITROLINGUAL) 0.4 MG/SPRAY spray Place 1 spray under the tongue every 5 (five) minutes x 3 doses as needed for chest pain. 4.9 g 2  . Omega-3 Fatty Acids (FISH OIL) 1000 MG CAPS Take 1,000 mg by mouth 2 (two) times daily.     . pantoprazole (PROTONIX) 40 MG tablet TAKE 1 TABLET BY MOUTH EVERY DAY (Patient taking differently: Take 40 mg by mouth daily. ) 90 tablet 1  . predniSONE (DELTASONE) 50 MG tablet Take as directed prior to TAVR surgery 2 tablet 0  . ranolazine (RANEXA) 500 MG 12 hr tablet TAKE 1 TABLET BY MOUTH TWICE A DAY (Patient taking differently: Take 500 mg by mouth 2 (two) times daily. ) 180 tablet 3  . vitamin C (ASCORBIC ACID) 500 MG tablet Take 500 mg by mouth at bedtime.     . vitamin E 400 UNIT capsule Take 400 Units by mouth daily.    zinc gluconate 50 MG tablet Take 50 mg by mouth 2 (two) times a week.     No current facility-administered medications for this visit.    Allergies  Allergen Reactions  . Altace [Ramipril] Swelling and Other (See Comments)    Mouth swelling  . Mucinex [Guaifenesin Er] Hives, Swelling and Other (See Comments)    Mouth swelling  . Contrast Media [Iodinated Diagnostic Agents] Other (See Comments)    Made eyes change each time      Review of Systems:   General:  normal appetite, + reduced energy, no weight gain, no weight loss, no fever  Cardiac:  + chest pain with exertion, no chest pain at rest, +SOB with mild exertion, no resting SOB, no PND, no orthopnea, no palpitations, + arrhythmia, + atrial fibrillation, + LE edema, + dizzy spells, no syncope  Respiratory:  +  exertional shortness of breath, no home oxygen, no productive cough, no dry cough, no bronchitis, no wheezing, no hemoptysis, no asthma, no pain with inspiration or cough, + sleep apnea, + CPAP at night  GI:   no difficulty swallowing, +  reflux, no frequent heartburn, no hiatal hernia, no abdominal pain, no constipation, no diarrhea, no hematochezia, no hematemesis, no melena  GU:   no dysuria,  no frequency, no urinary tract infection, no hematuria, + enlarged prostate, no kidney stones, + chronic kidney disease  Vascular:  no pain suggestive of claudication, no pain in feet, no leg cramps, no varicose veins, no DVT, no non-healing foot ulcer  Neuro:   no stroke, no TIA's, no seizures, no headaches, no temporary blindness one eye,  no slurred speech, no peripheral neuropathy, no chronic pain, no instability of gait, no memory/cognitive dysfunction  Musculoskeletal: no arthritis, no joint swelling, no myalgias, no difficulty walking, normal mobility   Skin:   no rash, no itching, no skin infections, no pressure sores or ulcerations  Psych:   no anxiety, no depression, no nervousness, no unusual recent stress  Eyes:   no blurry vision, no floaters, + recent vision changes, + wears glasses or contacts  ENT:   + hearing loss, no loose or painful teeth, + dentures  Hematologic:  no easy bruising, no abnormal bleeding, no clotting disorder, no frequent epistaxis  Endocrine:  no diabetes, does not check CBG's at home       Physical Exam:   BP (!) 153/75 (BP Location: Left Arm, Patient Position: Sitting, Cuff Size: Normal)   Pulse 67   Temp 98.7 F (37.1 C)   Resp 20   Ht 5\' 8"  (1.727 m)   Wt 194 lb 6.4 oz (88.2 kg)   SpO2 94% Comment: RA  BMI 29.56 kg/m   General:  Looks younger than age,  well-appearing  HEENT:  Unremarkable, NCAT, PERLA, EOMI  Neck:   no JVD, no bruits, no adenopathy   Chest:   clear to auscultation, symmetrical breath sounds, no wheezes, no rhonchi   CV:   RRR, grade lll/VI crescendo/decrescendo murmur heard best at RSB,  no diastolic murmur  Abdomen:  soft, non-tender, no masses   Extremities:  warm, well-perfused, pulses not palpable at ankle due to edema, bilateral LE  edema  Rectal/GU  Deferred  Neuro:   Grossly non-focal and symmetrical throughout  Skin:   Clean and dry, no rashes, no breakdown   Diagnostic Tests:  ECHOCARDIOGRAM REPORT       Patient Name:  ADARIUS TIGGES Date of Exam: 08/14/2019  Medical Rec #: [id number]   Height:    68.0 in  Accession #:  [id number]  Weight:    195.6 lb  Date of Birth: 1936/01/06   BSA:     2.025 m  Patient Age:  83 years   BP:      130/80 mmHg  Patient Gender: M       HR:      61 bpm.  Exam Location: [address]   Procedure: 2D Echo, Cardiac Doppler and Color Doppler   Indications:  I50.22 Chronic systolic (congestive) heart failure    History:    Patient has prior history of Echocardiogram examinations,  most         recent 03/03/2018. Prior CABG, Arrythmias:Atrial  Fibrillation;         Risk Factors:Hypertension and Dyslipidemia.    Sonographer:  Luvenia Redden RDCS  Referring Phys: [phone] HAO MENG  IMPRESSIONS    1. Left ventricular ejection fraction, by estimation, is 55 to 60%. The  left ventricle has normal function. The left ventricle has no regional  wall motion abnormalities. Left ventricular diastolic parameters are  consistent with Grade II diastolic  dysfunction (pseudonormalization).  2. Right ventricular systolic function is normal. The right ventricular  size is normal.  3. The mitral valve is normal in structure. Mild mitral valve  regurgitation. No evidence of mitral stenosis.  4. Tricuspid valve regurgitation is moderate.  5. The aortic valve is tricuspid. Aortic valve regurgitation is not  visualized. Moderate to severe aortic valve stenosis. Aortic valve mean  gradient measures 35.0 mmHg.   FINDINGS  Left Ventricle: Left ventricular ejection fraction, by estimation, is 55  to 60%. The left ventricle has normal function. The left ventricle has no  regional wall motion abnormalities. The left  ventricular internal cavity  size was normal in size. There is  borderline left ventricular hypertrophy. Left ventricular diastolic  parameters are consistent with Grade II diastolic dysfunction  (pseudonormalization).   Right Ventricle: The right ventricular size is normal. No increase in  right ventricular wall thickness. Right ventricular systolic function is  normal.   Left Atrium: Left atrial size was normal in size.   Right Atrium: Right atrial size was normal in size.   Pericardium: There is no evidence of pericardial effusion.   Mitral Valve: The mitral valve is normal in structure. Mild mitral valve  regurgitation. No evidence of mitral valve stenosis.   Tricuspid Valve: The tricuspid valve is grossly normal. Tricuspid valve  regurgitation is moderate . No evidence of tricuspid stenosis.   Aortic Valve: The aortic valve is tricuspid. . There is mild thickening  and moderate calcification of the aortic valve. Aortic valve regurgitation  is not visualized. Moderate to severe aortic stenosis is present. Severe  aortic valve annular  calcification. There is mild thickening of the aortic valve. There is  moderate calcification of the aortic valve. Aortic valve mean gradient  measures 35.0 mmHg. Aortic valve peak gradient measures 67.1 mmHg. Aortic  valve area, by VTI measures 1.05 cm.   Pulmonic Valve: The pulmonic valve was grossly normal. Pulmonic valve  regurgitation is not visualized. No evidence of pulmonic stenosis.   Aorta: The aortic root and ascending aorta are structurally normal, with  no evidence of dilitation.   IAS/Shunts: The atrial septum is grossly normal.     LEFT VENTRICLE  PLAX 2D  LVIDd:     5.90 cm Diastology  LVIDs:     4.30 cm LV e' lateral:  5.44 cm/s  LV PW:     1.50 cm LV E/e' lateral: 16.8  LV IVS:    1.10 cm LV e' medial:  6.09 cm/s  LVOT diam:   2.10 cm LV E/e' medial: 15.0  LV SV:     100  LV SV  Index:  49  LVOT Area:   3.46 cm     RIGHT VENTRICLE  RV S prime:   4.35 cm/s  TAPSE (M-mode): 1.9 cm   LEFT ATRIUM       Index    RIGHT ATRIUM      Index  LA diam:    4.10 cm 2.03 cm/m RA Area:   14.90 cm  LA Vol (A2C):  73.3 ml 36.21 ml/m RA Volume:  30.20 ml 14.92 ml/m  LA Vol (A4C):  62.6 ml 30.92 ml/m  LA Biplane Vol: 70.2 ml 34.67 ml/m  AORTIC VALVE  AV Area (Vmax):  1.04 cm  AV Area (Vmean):  1.01 cm  AV Area (VTI):   1.05 cm  AV Vmax:      409.50 cm/s  AV Vmean:     264.000 cm/s  AV VTI:      0.951 m  AV Peak Grad:   67.1 mmHg  AV Mean Grad:   35.0 mmHg  LVOT Vmax:     123.00 cm/s  LVOT Vmean:    77.200 cm/s  LVOT VTI:     0.289 m  LVOT/AV VTI ratio: 0.30    AORTA  Ao Root diam: 4.30 cm   MITRAL VALVE  MV Area (PHT): 4.57 cm  SHUNTS  MV Decel Time: 166 msec  Systemic VTI: 0.29 m  MV E velocity: 91.30 cm/s Systemic Diam: 2.10 cm  MV A velocity: 94.30 cm/s  MV E/A ratio: 0.97   Kristeen Miss MD  Electronically signed by Kristeen Miss MD  Signature Date/Time: 08/14/2019/1:36:55 PM      Physicians  Panel Physicians Referring Physician Case Authorizing Physician  Lennette Bihari, MD (Primary)    Procedures  RIGHT/LEFT HEART CATH AND CORONARY/GRAFT ANGIOGRAPHY  Conclusion    Ost RCA to Dist RCA lesion is 100% stenosed.  Origin lesion is 100% stenosed.  Ost Cx to Prox Cx lesion is 100% stenosed.  Prox LAD to Mid LAD lesion is 100% stenosed with 100% stenosed side branch in 1st Diag.  Dist Graft lesion is 70% stenosed.  Prox Graft to Mid Graft lesion is 40% stenosed.  Previously placed Origin to Prox Graft stent (unknown type) is widely patent.   Severe native coronary restrictive disease with total occlusion of the proximal LAD prior to any diagonal vessel takeoff; total occlusion of the ostium of the left circumflex coronary artery and total occlusion of the  ostium of the native RCA.  Patent LIMA graft supplying the mid LAD.  Patent large SVG supplying the OM 2 vessel with filling of the circumflex to the OM1 vessel with 70% mid AV groove stenosis and evidence for collateralization to the distal RCA.  Patent stent in the proximal SVG supplying the diagonal vessel with mid graft narrowing of 40% and focal 70% distal graft stenosis.  Old occluded graft which had supplied the distal RCA.  Severe aortic stenosis with a mean transvalvular aortic gradient at 41 mm.\  Mild right heart pressure elevation.  RECOMMENDATION: I reviewed the angiographic findings with Dr. Excell Seltzer.  Will refer for TAVR consultation.  Medical therapy for his significant CAD.  Patient will resume Eliquis tomorrow.   Recommendations  Antiplatelet/Anticoag Recommend to resume Apixaban, at currently prescribed dose and frequency on 10/19/2019.  Indications  Nonrheumatic aortic valve stenosis [I35.0 (ICD-10-CM)]  Coronary artery disease involving native coronary artery of native heart with other form of angina pectoris (HCC) [U44.034 (ICD-10-CM)]  Procedural Details  Technical Details Mr. Dyan Creelman is an 84 year old gentleman who  underwent CABG revascularization surgery in 1994 and had a LIMA placed to the mid LAD, and SVG to the circumflex marginal, and SVG to second diagonal vessel, and the SVG to the RCA.  In 2003 he underwent stenting in the proximal SVG graft supplying the diagonal vessel.  Over the years he has developed aortic valve stenosis.  He developed increasing symptomatology of chest pain and shortness of breath in 2018 at a time he had significant anemia and GI blood loss.  Repeat catheterization showed severe native CAD with 95% proximal LAD stenosis just prior to the  first diagonal vessel with total occlusion of the LAD after the third septal perforating artery and before the takeoff of the second diagonal branch; total occlusion of the circumflex at the  ostium; and total proximal RCA occlusion.  He had a patent LIMA to the mid LAD which did not supply the proximal diagonal vessel, a patent vein graft supplying the distal marginal vessels with filling retrograde to the circumflex which also collateralized the distal RCA, a patent vein graft supplying the second diagonal vessel with a patent proximal stent, and an occluded vein graft which had supplied the distal RCA.  He had been on medical therapy for the past 3 years and had done fairly well.  However he is again began to notice increasing chest pain as well as shortness of breath.  A 2D echo Doppler study has demonstrated progressive aortic valve stenosis now felt to be approaching severe AS.  He presents for repeat right and left heart cardiac catheterization.  Patient arrived to the catheterization laboratory in the fasting state.  He was premedicated with Versed 1 mg and fentanyl 25 mcg.  His right femoral artery was punctured anteriorly and a 5 French sheath was inserted without difficulty.  His right femoral vein was punctured anteriorly and a 7 French sheath was inserted.  Swan-Ganz catheter was inserted and pressures were obtained in the RA, RV, PA, and wedge positions.  O2 saturation in PA was performed.  Thermodilution cardiac outputs were obtained.  Oxygen saturation was obtained in the central aorta via the JL 4 initial diagnostic catheter.  Ultimately a JL 5 catheter was used for selective angiography in the left coronary system.  A 5 [place] JR4 catheter was used for angiography in the vein grafts.  The ostial RCA was calcified but not able to be injected but this had previously been demonstrated to be totally occluded.  A LIMA catheter was used for selective angiography into the left internal mammary artery.  An AL-1 catheter was used with a straight wire and the aortic valve was crossed.  LV pressures were recorded.  An LV to AO pullback was performed under hemodynamic monitoring.  All catheters  were removed from the patient.  The patient tolerated the procedure well.  A minx closure device was used for hemostasis to the arterial sheath.  The venous sheath was removed and hemostasis was obtained by manual pressure.  The patient left the catheterization laboratory chest pain-free with stable hemodynamics. Estimated blood loss <50 mL.   During this procedure medications were administered to achieve and maintain moderate conscious sedation while the patient's heart rate, blood pressure, and oxygen saturation were continuously monitored and I was present face-to-face 100% of this time.  Medications (Filter: Administrations occurring from 1408 to 1614 on 10/18/19) (important) Continuous medications are totaled by the amount administered until 10/18/19 1614.  Heparin (Porcine) in NaCl 1000-0.9 UT/500ML-% SOLN (mL) Total volume:  1,000 mL Date/Time  Rate/Dose/Volume Action  10/18/19 1409  500 mL Given  1426  500 mL Given    midazolam (VERSED) injection (mg) Total dose:  1 mg Date/Time  Rate/Dose/Volume Action  10/18/19 1435  1 mg Given    fentaNYL (SUBLIMAZE) injection (mcg) Total dose:  25 mcg Date/Time  Rate/Dose/Volume Action  10/18/19 1435  25 mcg Given    lidocaine (PF) (XYLOCAINE) 1 % injection (mL) Total volume:  30 mL Date/Time  Rate/Dose/Volume Action  10/18/19 1446  30 mL Given    iohexol (OMNIPAQUE) 350 MG/ML injection (mL) Total volume:  95 mL Date/Time  Rate/Dose/Volume Action  10/18/19 1544  95 mL Given    Sedation Time  Sedation Time Physician-1: 1 hour 7 minutes 47 seconds  Contrast  Medication Name Total Dose  iohexol (OMNIPAQUE) 350 MG/ML injection 95 mL    Radiation/Fluoro  Fluoro time: 18.5 (min) DAP: 16109 (mGycm2) Cumulative Air Kerma: 362 (mGy)  Coronary Findings  Diagnostic Dominance: Right Left Anterior Descending  Prox LAD to Mid LAD lesion is 100% stenosed with 100% stenosed side branch in 1st Diag.  Left Circumflex  Ost Cx to Prox  Cx lesion is 100% stenosed.  Right Coronary Artery  Ost RCA to Dist RCA lesion is 100% stenosed.  Right Posterior Descending Artery  Collaterals  RPDA filled by collaterals from Dist Cx.    Graft To RPDA  Origin lesion is 100% stenosed.  Graft To 2nd Diag  Previously placed Origin to Prox Graft stent (unknown type) is widely patent.  Prox Graft to Mid Graft lesion is 40% stenosed.  Dist Graft lesion is 70% stenosed.  Graft To 2nd Mrg  LIMA Graft To Mid LAD  Intervention  No interventions have been documented. Right Heart  Right Heart Pressures RA: A-wave 6, V wave 7; mean 4 RV: 37/8 PA: 30/11; mean 24 PW: A-wave 15,  V wave 21; mean 15  Initial AO 162/81          LV: 206/20  Pullback: LV: 207/21 AO: 164/84  O2 saturation in the pulmonary artery 73% and in the central aorta 99%.  Cardiac output by the thermodilution method was 6.5 L/min and by the Fick method 6.4 L/min. Cardiac index by the thermodilution method was 3.2 L/min/m and by the Fick method 3.1 L/min/m.  PVR: 1.42 WU  Aortic valve: Peak to peak gradient 43, mean gradient 41, valve area 1.0 cm  Coronary Diagrams  Diagnostic Dominance: Right  Intervention  Implants   Vascular Products  Closure Mynx Control 23f - [id number] - Implanted Inventory item: CLOSURE [facility] CONTROL 54F Model/Cat number: [id number]  Manufacturer: [employer] Lot number: [id number]  Device identifier: [id number] Device identifier type: GS1  GUDID Information  Request status Successful    Brand name: MYNX CONTROL Version/Model: [id number]  Company name: [employer] MRI safety info as of 10/18/19: MR Safe  Contains dry or latex rubber: No    GMDN P.T. name: Wound hydrogel dressing, non-antimicrobial    As of 10/18/2019  Status: Implanted      Syngo Images  Show images for CARDIAC CATHETERIZATION Images on Long Term Storage  Show images for Sorbo, Refujio S Link to Procedure Log  Procedure Log    Hemo Data    Most Recent Value  Fick Cardiac Output 6.35 L/min  Fick Cardiac Output Index 3.13 (L/min)/BSA  Thermal Cardiac Output 6.49 L/min  Thermal Cardiac Output Index 3.2 (L/min)/BSA  Aortic Mean Gradient 40.5 mmHg  Aortic Peak Gradient 43 mmHg  Aortic Valve Area 1.00  Aortic Value Area Index 0.49 cm2/BSA  RA A Wave 6 mmHg  RA V Wave 7 mmHg  RA Mean 4 mmHg  RV Systolic Pressure 37 mmHg  RV Diastolic Pressure 1 mmHg  RV EDP 8 mmHg  PA Systolic Pressure 30 mmHg  PA Diastolic Pressure 11 mmHg  PA Mean 24 mmHg  PW A Wave 15 mmHg  PW V Wave 21 mmHg  PW Mean 15 mmHg  AO Systolic Pressure 162 mmHg  AO Diastolic Pressure 81 mmHg  AO Mean 114 mmHg  LV  Systolic Pressure 205 mmHg  LV Diastolic Pressure 13 mmHg  LV EDP 20 mmHg  AOp Systolic Pressure 164 mmHg  AOp Diastolic Pressure 84 mmHg  AOp Mean Pressure 117 mmHg  LVp Systolic Pressure 207 mmHg  LVp Diastolic Pressure 13 mmHg  LVp EDP Pressure 21 mmHg  TPVR Index 7.5 HRUI  TSVR Index 35.62 HRUI  PVR SVR Ratio 0.08  TPVR/TSVR Ratio 0.21   ADDENDUM REPORT: 11/07/2019 07:39  CLINICAL DATA:  84 year old male with severe aortic stenosis being evaluated for a TAVR procedure.  EXAM: Cardiac TAVR CT  TECHNIQUE: The patient was scanned on a [employer]. A 120 kV retrospective scan was triggered in the descending thoracic aorta at 111 HU's. Gantry rotation speed was 250 msecs and collimation was .6 mm. No beta blockade or nitro were given. The 3D data set was reconstructed in 5% intervals of the R-R cycle. Systolic and diastolic phases were analyzed on a dedicated work station using MPR, MIP and VRT modes. The patient received 80 cc of contrast.  FINDINGS: Aortic Valve: Trileaflet aortic valve with severely calcified leaflet with severe motion restriction and only minimal calcifications extending into the LVOT.  Aorta: Normal size of the thoracic aorta with moderate diffuse atherosclerotic plaque and calcifications  and no dissection.  Sinotubular Junction: 34 x 30 mm  Ascending Thoracic Aorta: 37 x 35 mm  Aortic Arch: 26 x 23 mm  Descending Thoracic Aorta: 28 x 27 mm  Sinus of Valsalva Measurements:  Non-coronary: 38 mm  Right -coronary: 36 mm  Left -coronary: 37 mm  Coronary Artery Height above Annulus:  Left Main: 15 mm  Right Coronary: 15 mm  Virtual Basal Annulus Measurements:  Maximum/Minimum Diameter: 29.3 x 23.4 mm  Mean Diameter: 25.6 mm  Perimeter: 83.1 mm  Area: 514 mm2  Optimum Fluoroscopic Angle for Delivery: LAO 2 CAU 1  IMPRESSION: 1. Trileaflet aortic valve with severely calcified leaflet with severe motion restriction and only minimal calcifications extending into the LVOT. Annular measurements suitable for delivery of a 26 mm Edwards-SAPIEN 3 Ultra valve. Aortic valve calcium score 3373 consistent with severe aortic stenosis.  2. Sufficient coronary to annulus distance.  3. Optimum Fluoroscopic Angle for Delivery: LAO 2 CAU 1  4. No thrombus in the left atrial appendage.   Electronically Signed   By: Tobias Alexander   On: 11/07/2019 07:39   Addended by Lars Masson, MD on 11/07/2019 7:41 AM  Study Result  Addenda  ADDENDUM REPORT: 11/07/2019 07:39  CLINICAL DATA:  84 year old male with severe aortic stenosis being evaluated for a TAVR procedure.  EXAM: Cardiac TAVR CT  TECHNIQUE: The patient was scanned on a [employer]. A 120 kV retrospective scan was triggered in the descending thoracic aorta at 111 HU's. Gantry rotation speed was 250 msecs and collimation was .6 mm. No beta blockade or nitro were given. The 3D data set was reconstructed in 5% intervals of the R-R cycle. Systolic and diastolic phases were analyzed on a dedicated work station using MPR, MIP and VRT modes. The patient received 80 cc of contrast.  FINDINGS: Aortic Valve: Trileaflet aortic valve with severely  calcified leaflet with severe motion restriction and only minimal calcifications extending into the LVOT.  Aorta: Normal size of the thoracic aorta with moderate diffuse atherosclerotic plaque and calcifications and no dissection.  Sinotubular Junction: 34 x 30 mm  Ascending Thoracic Aorta: 37 x 35 mm  Aortic Arch: 26 x 23 mm  Descending Thoracic Aorta: 28  x 27 mm  Sinus of Valsalva Measurements:  Non-coronary: 38 mm  Right -coronary: 36 mm  Left -coronary: 37 mm  Coronary Artery Height above Annulus:  Left Main: 15 mm  Right Coronary: 15 mm  Virtual Basal Annulus Measurements:  Maximum/Minimum Diameter: 29.3 x 23.4 mm  Mean Diameter: 25.6 mm  Perimeter: 83.1 mm  Area: 514 mm2  Optimum Fluoroscopic Angle for Delivery: LAO 2 CAU 1  IMPRESSION: 1. Trileaflet aortic valve with severely calcified leaflet with severe motion restriction and only minimal calcifications extending into the LVOT. Annular measurements suitable for delivery of a 26 mm Edwards-SAPIEN 3 Ultra valve. Aortic valve calcium score 3373 consistent with severe aortic stenosis.  2. Sufficient coronary to annulus distance.  3. Optimum Fluoroscopic Angle for Delivery: LAO 2 CAU 1  4. No thrombus in the left atrial appendage.   Electronically Signed   By: Tobias Alexander   On: 11/07/2019 07:39   Signed by Lars Masson, MD on 11/07/2019 7:41 AM  Narrative & Impression  EXAM: OVER-READ INTERPRETATION  CT CHEST  The following report is an over-read performed by radiologist Dr. Trudie Reed of [facility] on 11/06/2019. This over-read does not include interpretation of cardiac or coronary anatomy or pathology. The coronary calcium score/coronary CTA interpretation by the cardiologist is attached.  COMPARISON:  None.  FINDINGS: Extracardiac findings will be described separately under dictation for contemporaneously obtained CTA chest,  abdomen and pelvis.  IMPRESSION: Please see separate dictation for contemporaneously obtained CTA chest, abdomen and pelvis dated 11/06/2019 for full description of relevant extracardiac findings.  Electronically Signed: By: Trudie Reed M.D. On: 11/06/2019 12:42     CLINICAL DATA:  84 year old male with history of severe aortic stenosis. Preprocedural study prior to potential transcatheter aortic valve replacement (TAVR) procedure.  EXAM: CT ANGIOGRAPHY CHEST, ABDOMEN AND PELVIS  TECHNIQUE: Non-contrast CT of the chest was initially obtained.  Multidetector CT imaging through the chest, abdomen and pelvis was performed using the standard protocol during bolus administration of intravenous contrast. Multiplanar reconstructed images and MIPs were obtained and reviewed to evaluate the vascular anatomy.  CONTRAST:  OMNIPAQUE IOHEXOL 350 MG/ML SOLN  COMPARISON:  No priors.  FINDINGS: CTA CHEST FINDINGS  Cardiovascular: Heart size is normal. There is no significant pericardial fluid, thickening or pericardial calcification. There is aortic atherosclerosis, as well as atherosclerosis of the great vessels of the mediastinum and the coronary arteries, including calcified atherosclerotic plaque in the left main, left anterior descending, left circumflex and right coronary arteries. Status post median sternotomy for CABG including LIMA to the LAD. Severe thickening calcification of the aortic valve.  Mediastinum/Lymph Nodes: No pathologically enlarged mediastinal or hilar lymph nodes. Esophagus is unremarkable in appearance. No axillary lymphadenopathy.  Lungs/Pleura: No suspicious appearing pulmonary nodules or masses are noted. No acute consolidative airspace disease. No pleural effusions.  Musculoskeletal/Soft Tissues: Median sternotomy wires. There are no aggressive appearing lytic or blastic lesions noted in the visualized portions of the  skeleton.  CTA ABDOMEN AND PELVIS FINDINGS  Hepatobiliary: No suspicious cystic or solid hepatic lesions. No intra or extrahepatic biliary ductal dilatation. Gallbladder is normal in appearance.  Pancreas: No pancreatic mass. No pancreatic ductal dilatation. No pancreatic or peripancreatic fluid collections or inflammatory changes.  Spleen: Unremarkable.  Adrenals/Urinary Tract: Multiple small nonobstructive calculi are noted within the collecting systems of both kidneys measuring up to 4 mm in the lower pole collecting system of the right kidney. Multiple small renal lesions are noted bilaterally,  most of which are too small to definitively characterize. Several of the larger lesions are intermediate to higher attenuation, and are concerning for potential enhancing lesions, but are incompletely characterized on today's study. The most concerning of these is in the interpolar region of the left kidney (axial image 120 of series 16) measuring 2.2 x 1.9 cm (72 HU). No hydroureteronephrosis. Urinary bladder is normal in appearance. Bilateral adrenal glands are normal in appearance.  Stomach/Bowel: Normal appearance of the stomach. No pathologic dilatation of small bowel or colon. Numerous colonic diverticulae are noted, particularly in the sigmoid colon, without surrounding inflammatory changes to suggest an acute diverticulitis at this time. The appendix is not confidently identified and may be surgically absent. Regardless, there are no inflammatory changes noted adjacent to the cecum to suggest the presence of an acute appendicitis at this time.  Vascular/Lymphatic: Aortic atherosclerosis, with vascular findings and measurements pertinent to potential TAVR procedure, as detailed below. 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  Reproductive: Prostate gland and seminal vesicles are  unremarkable in appearance.  Other: No significant volume of ascites.  No pneumoperitoneum.  Musculoskeletal: Chronic appearing compression fractures of L1 and L2 with approximately 30% loss of anterior vertebral body height at both levels. 5 mm sclerotic lesion with narrow zone of transition in the left sacral ala, likely to represent a bone island. There are no other more aggressive appearing lytic or blastic lesions noted in the visualized portions of the skeleton.  VASCULAR MEASUREMENTS PERTINENT TO TAVR:  AORTA:  Minimal Aortic Diameter-12 x 12 mm  Severity of Aortic Calcification-severe  RIGHT PELVIS:  Right Common Iliac Artery -  Minimal Diameter-10.2 x 10.4 mm  Tortuosity-moderate  Calcification-moderate  Right External Iliac Artery -  Minimal Diameter-8.6 x 8.1 mm  Tortuosity-severe  Calcification-mild  Comment: Short segment dissection of the distal right external iliac artery.  Right Common Femoral Artery -  Minimal Diameter-8.2 x 7.7 mm  Tortuosity-mild  Calcification-mild-to-moderate  LEFT PELVIS:  Left Common Iliac Artery -  Minimal Diameter-11.3 x 6.8 mm  Tortuosity-moderate  Calcification-moderate  Comment: Short segment dissection of the proximal left common iliac artery.  Left External Iliac Artery -  Minimal Diameter-8.7 x 9.0 mm  Tortuosity-severe  Calcification-mild  Left Common Femoral Artery -  Minimal Diameter-8.9 x 7.4 mm  Tortuosity-mild  Calcification-mild  Review of the MIP images confirms the above findings.  IMPRESSION: 1. Vascular findings and measurements pertinent to potential TAVR procedure, as detailed above. In particular, please take note of the short segment dissections in the right external iliac and left common iliac arteries. 2. Severe thickening calcification of the aortic valve, compatible with the reported clinical history of severe aortic stenosis. 3.  Aortic atherosclerosis, in addition to left main and 3 vessel coronary artery disease. Status post median sternotomy for CABG including LIMA to the LAD. 4. Multiple indeterminate lesions in the kidneys, most concerning of which measures 2.2 x 1.9 cm in the interpolar region of the left kidney. Further characterization with nonemergent abdominal MRI with and without IV gadolinium is strongly recommended in the near future to exclude the possibility of underlying renal neoplasm. 5. Nonobstructive calculi in the collecting systems of both kidneys measuring up to 4 mm in the lower pole collecting system of the right kidney. 6. Colonic diverticulosis without evidence of acute diverticulitis at this time. 7. Additional incidental findings, as above.   Electronically Signed   By: Brayton Mars.D.  On: 11/06/2019 13:36  STS Risk Score: Risk of Mortality: 2.498% Renal Failure: 3.466% Permanent Stroke: 1.626% Prolonged Ventilation: 9.722% DSW Infection: 0.172% Reoperation: 4.597% Morbidity or Mortality: 15.465% Short Length of Stay: 20.908% Long Length of Stay: 9.223%   Impression:  This 84 year old gentleman has stage D, severe, symptomatic aortic stenosis with New York Heart Association class II symptoms of progressive exertional fatigue and shortness of breath as well as lower extremity edema and exertional substernal chest discomfort consistent with chronic diastolic congestive heart failure.  I have personally reviewed his 2D echocardiogram, cardiac catheterization, and CTA studies.  His echocardiogram shows a trileaflet aortic valve with calcified leaflets with restricted mobility.  The mean gradient was measured at 35 mmHg.  Peak gradient was 67.1 mmHg with a valve area of 1.05 cm.  Left ventricular ejection fraction was 60 to 65%.  Cardiac catheterization showed total occlusion of the native coronary arteries proximally with a patent left internal mammary graft to  LAD and patent saphenous vein graft to the second marginal and diagonal branch.  The saphenous vein graft to the right coronary artery is occluded.  The mean gradient across the aortic valve was measured at 41 mmHg.  There is mild elevation of right heart pressures.  I agree that aortic valve replacement is indicated in this patient to prevent progressive left ventricular deterioration and improve his progressive heart failure symptoms.  Since he is 84 years old and has had prior heart surgery I think transcatheter aortic valve replacement be the best option for him.  He has 3 patent vein grafts and no need for revascularization.  His gated cardiac CTA shows anatomy suitable for transcatheter aortic valve replacement using a SAPIEN 3 valve.  His abdominal and pelvic CTA shows some localized dissection in the left common iliac artery I think there is adequate lumen to pass a diagnostic catheter.  The right iliofemoral system appears large enough for valve insertion.  There is a question of a localized dissection in the right external iliac artery but I think that is probably just a bend in the artery.  He does have significant calcification of the abdominal aorta but the lumen is large enough.  The abdominal CT also showed multiple indeterminate lesions in the kidneys with the most concerning being a 2.2 x 1.9 cm area in the interpolar region of the left kidney.  Most of these look like renal cysts but the one in the interpolar region of the left kidney is a little different than the others and an abdominal MRI in the future was recommended to rule out renal neoplasm.  I reviewed the images with the patient and his wife and plans for follow-up.  All of their questions were answered.  The patient and his wife were counseled at length regarding treatment alternatives for management of severe symptomatic aortic stenosis. The risks and benefits of surgical intervention has been discussed in detail. Long-term  prognosis with medical therapy was discussed. Alternative approaches such as conventional surgical aortic valve replacement, transcatheter aortic valve replacement, and palliative medical therapy were compared and contrasted at length. This discussion was placed in the context of the patient's own specific clinical presentation and past medical history. All of their questions have been addressed.   Following the decision to proceed with transcatheter aortic valve replacement, a discussion was held regarding what types of management strategies would be attempted intraoperatively in the event of life-threatening complications, including whether or not the patient would be considered a candidate for the  use of cardiopulmonary bypass and/or conversion to open sternotomy for attempted surgical intervention. The patient is aware of the fact that transient use of cardiopulmonary bypass may be necessary.  Given his advanced age and the fact that he is a redo patient I do not think he would be a candidate for emergent sternotomy to manage any intraoperative complications.  The patient has been advised of a variety of complications that might develop including but not limited to risks of death, stroke, paravalvular leak, aortic dissection or other major vascular complications, aortic annulus rupture, device embolization, cardiac rupture or perforation, mitral regurgitation, acute myocardial infarction, arrhythmia, heart block or bradycardia requiring permanent pacemaker placement, congestive heart failure, respiratory failure, renal failure, pneumonia, infection, other late complications related to structural valve deterioration or migration, or other complications that might ultimately cause a temporary or permanent loss of functional independence or other long term morbidity.  He had a previous ECG in 2019 bifascicular block and since then ECGs have shown nonspecific intraventricular block as well as an LVH pattern with  wide complexes suggesting left bundle branch block.  I think this would put him at increased risk for postoperative complete heart block and therefore we will place a Tempo wire in the right neck for pacing at the beginning of the procedure.  I discussed this with the patient and his wife and they are in agreement.  The patient provides full informed consent for the procedure as described and all questions were answered.     Plan:  He will be scheduled for transfemoral transcatheter aortic valve placement on Tuesday, 11/14/2019 with placement of a Tempo pacing wire in the right internal jugular vein at the beginning of the procedure.  He discontinued his Eliquis as of yesterday.   I spent 80 minutes performing this consultation and > 50% of this time was spent face to face counseling and coordinating the care of this patient's severe symptomatic aortic stenosis.    Alleen Borne, MD 11/09/2019

## 2019-11-10 ENCOUNTER — Encounter (HOSPITAL_COMMUNITY)
Admission: RE | Admit: 2019-11-10 | Discharge: 2019-11-10 | Disposition: A | Discharge status: Home / Self Care | Payer: Medicare PPO | Source: Ambulatory Visit | Attending: Cardiovascular Disease | Admitting: Cardiovascular Disease

## 2019-11-10 ENCOUNTER — Ambulatory Visit (HOSPITAL_COMMUNITY)
Admission: RE | Admit: 2019-11-10 | Discharge: 2019-11-10 | Disposition: A | Discharge status: Home / Self Care | Payer: Medicare PPO | Source: Ambulatory Visit | Attending: Cardiovascular Disease | Admitting: Cardiovascular Disease

## 2019-11-10 ENCOUNTER — Other Ambulatory Visit (HOSPITAL_COMMUNITY)
Admission: RE | Admit: 2019-11-10 | Discharge: 2019-11-10 | Disposition: A | Discharge status: Home / Self Care | Payer: Medicare PPO | Source: Ambulatory Visit | Attending: Cardiovascular Disease | Admitting: Cardiovascular Disease

## 2019-11-10 ENCOUNTER — Encounter (HOSPITAL_COMMUNITY): Payer: Self-pay

## 2019-11-10 DIAGNOSIS — Z951 Presence of aortocoronary bypass graft: Secondary | ICD-10-CM | POA: Diagnosis not present

## 2019-11-10 DIAGNOSIS — I350 Nonrheumatic aortic (valve) stenosis: Secondary | ICD-10-CM | POA: Diagnosis not present

## 2019-11-10 DIAGNOSIS — Z955 Presence of coronary angioplasty implant and graft: Secondary | ICD-10-CM | POA: Diagnosis not present

## 2019-11-10 DIAGNOSIS — J45909 Unspecified asthma, uncomplicated: Secondary | ICD-10-CM | POA: Insufficient documentation

## 2019-11-10 DIAGNOSIS — Z7901 Long term (current) use of anticoagulants: Secondary | ICD-10-CM | POA: Insufficient documentation

## 2019-11-10 DIAGNOSIS — Z01818 Encounter for other preprocedural examination: Secondary | ICD-10-CM | POA: Diagnosis not present

## 2019-11-10 DIAGNOSIS — E7800 Pure hypercholesterolemia, unspecified: Secondary | ICD-10-CM | POA: Diagnosis not present

## 2019-11-10 DIAGNOSIS — I6523 Occlusion and stenosis of bilateral carotid arteries: Secondary | ICD-10-CM | POA: Diagnosis not present

## 2019-11-10 DIAGNOSIS — K219 Gastro-esophageal reflux disease without esophagitis: Secondary | ICD-10-CM | POA: Diagnosis not present

## 2019-11-10 DIAGNOSIS — I480 Paroxysmal atrial fibrillation: Secondary | ICD-10-CM | POA: Diagnosis not present

## 2019-11-10 DIAGNOSIS — Z20822 Contact with and (suspected) exposure to covid-19: Secondary | ICD-10-CM | POA: Insufficient documentation

## 2019-11-10 DIAGNOSIS — Z91041 Radiographic dye allergy status: Secondary | ICD-10-CM | POA: Insufficient documentation

## 2019-11-10 DIAGNOSIS — N183 Chronic kidney disease, stage 3 unspecified: Secondary | ICD-10-CM | POA: Diagnosis not present

## 2019-11-10 DIAGNOSIS — G4733 Obstructive sleep apnea (adult) (pediatric): Secondary | ICD-10-CM | POA: Diagnosis not present

## 2019-11-10 DIAGNOSIS — Z79899 Other long term (current) drug therapy: Secondary | ICD-10-CM | POA: Diagnosis not present

## 2019-11-10 DIAGNOSIS — I129 Hypertensive chronic kidney disease with stage 1 through stage 4 chronic kidney disease, or unspecified chronic kidney disease: Secondary | ICD-10-CM | POA: Insufficient documentation

## 2019-11-10 DIAGNOSIS — Z79890 Hormone replacement therapy (postmenopausal): Secondary | ICD-10-CM | POA: Diagnosis not present

## 2019-11-10 DIAGNOSIS — I2510 Atherosclerotic heart disease of native coronary artery without angina pectoris: Secondary | ICD-10-CM | POA: Diagnosis not present

## 2019-11-10 HISTORY — DX: Sleep apnea, unspecified: G47.30

## 2019-11-10 LAB — BLOOD GAS, ARTERIAL
Acid-base deficit: 2.5 mmol/L — ABNORMAL HIGH (ref 0.0–2.0)
Bicarbonate: 21.5 mmol/L (ref 20.0–28.0)
Drawn by: 58743
FIO2: 21
O2 Saturation: 95.6 %
Patient temperature: 37
pCO2 arterial: 35 mmHg (ref 32.0–48.0)
pH, Arterial: 7.404 (ref 7.350–7.450)
pO2, Arterial: 106 mmHg (ref 83.0–108.0)

## 2019-11-10 LAB — URINALYSIS, ROUTINE W REFLEX MICROSCOPIC
Bilirubin Urine: NEGATIVE
Glucose, UA: NEGATIVE mg/dL
Hgb urine dipstick: NEGATIVE
Ketones, ur: NEGATIVE mg/dL
Leukocytes,Ua: NEGATIVE
Nitrite: NEGATIVE
Protein, ur: NEGATIVE mg/dL
Specific Gravity, Urine: 1.02 (ref 1.005–1.030)
pH: 5 (ref 5.0–8.0)

## 2019-11-10 LAB — COMPREHENSIVE METABOLIC PANEL
ALT: 50 U/L — ABNORMAL HIGH (ref 0–44)
AST: 41 U/L (ref 15–41)
Albumin: 3.3 g/dL — ABNORMAL LOW (ref 3.5–5.0)
Alkaline Phosphatase: 45 U/L (ref 38–126)
Anion gap: 7 (ref 5–15)
BUN: 28 mg/dL — ABNORMAL HIGH (ref 8–23)
CO2: 21 mmol/L — ABNORMAL LOW (ref 22–32)
Calcium: 8.9 mg/dL (ref 8.9–10.3)
Chloride: 114 mmol/L — ABNORMAL HIGH (ref 98–111)
Creatinine, Ser: 1.55 mg/dL — ABNORMAL HIGH (ref 0.61–1.24)
GFR calc Af Amer: 48 mL/min — ABNORMAL LOW (ref >60)
GFR calc non Af Amer: 41 mL/min — ABNORMAL LOW (ref >60)
Glucose, Bld: 97 mg/dL (ref 70–99)
Potassium: 4 mmol/L (ref 3.5–5.1)
Sodium: 142 mmol/L (ref 135–145)
Total Bilirubin: 0.9 mg/dL (ref 0.3–1.2)
Total Protein: 5.8 g/dL — ABNORMAL LOW (ref 6.5–8.1)

## 2019-11-10 LAB — CBC
HCT: 35.1 % — ABNORMAL LOW (ref 39.0–52.0)
Hemoglobin: 11.3 g/dL — ABNORMAL LOW (ref 13.0–17.0)
MCH: 31.9 pg (ref 26.0–34.0)
MCHC: 32.2 g/dL (ref 30.0–36.0)
MCV: 99.2 fL (ref 80.0–100.0)
Platelets: 167 10*3/uL (ref 150–400)
RBC: 3.54 MIL/uL — ABNORMAL LOW (ref 4.22–5.81)
RDW: 13.3 % (ref 11.5–15.5)
WBC: 6.4 10*3/uL (ref 4.0–10.5)
nRBC: 0 % (ref 0.0–0.2)

## 2019-11-10 LAB — HEMOGLOBIN A1C
Hgb A1c MFr Bld: 5.5 % (ref 4.8–5.6)
Mean Plasma Glucose: 111.15 mg/dL

## 2019-11-10 LAB — SARS CORONAVIRUS 2 (TAT 6-24 HRS): SARS Coronavirus 2: NEGATIVE

## 2019-11-10 LAB — SURGICAL PCR SCREEN
MRSA, PCR: NEGATIVE
Staphylococcus aureus: NEGATIVE

## 2019-11-10 LAB — TYPE AND SCREEN
ABO/RH(D): A POS
Antibody Screen: NEGATIVE

## 2019-11-10 LAB — APTT: aPTT: 32 seconds (ref 24–36)

## 2019-11-10 LAB — BRAIN NATRIURETIC PEPTIDE: B Natriuretic Peptide: 371.6 pg/mL — ABNORMAL HIGH (ref 0.0–100.0)

## 2019-11-10 LAB — PROTIME-INR
INR: 1.4 — ABNORMAL HIGH (ref 0.8–1.2)
Prothrombin Time: 16.7 seconds — ABNORMAL HIGH (ref 11.4–15.2)

## 2019-11-10 IMAGING — CR DG CHEST 2V
2 series · 2 of 2 positions shown · non-contrast
Comparison: CT chest [DATE]

CLINICAL DATA: Preoperative radiograph, TAVR scheduled [DATE]

EXAM:
CHEST - 2 VIEW

[w chest pa]
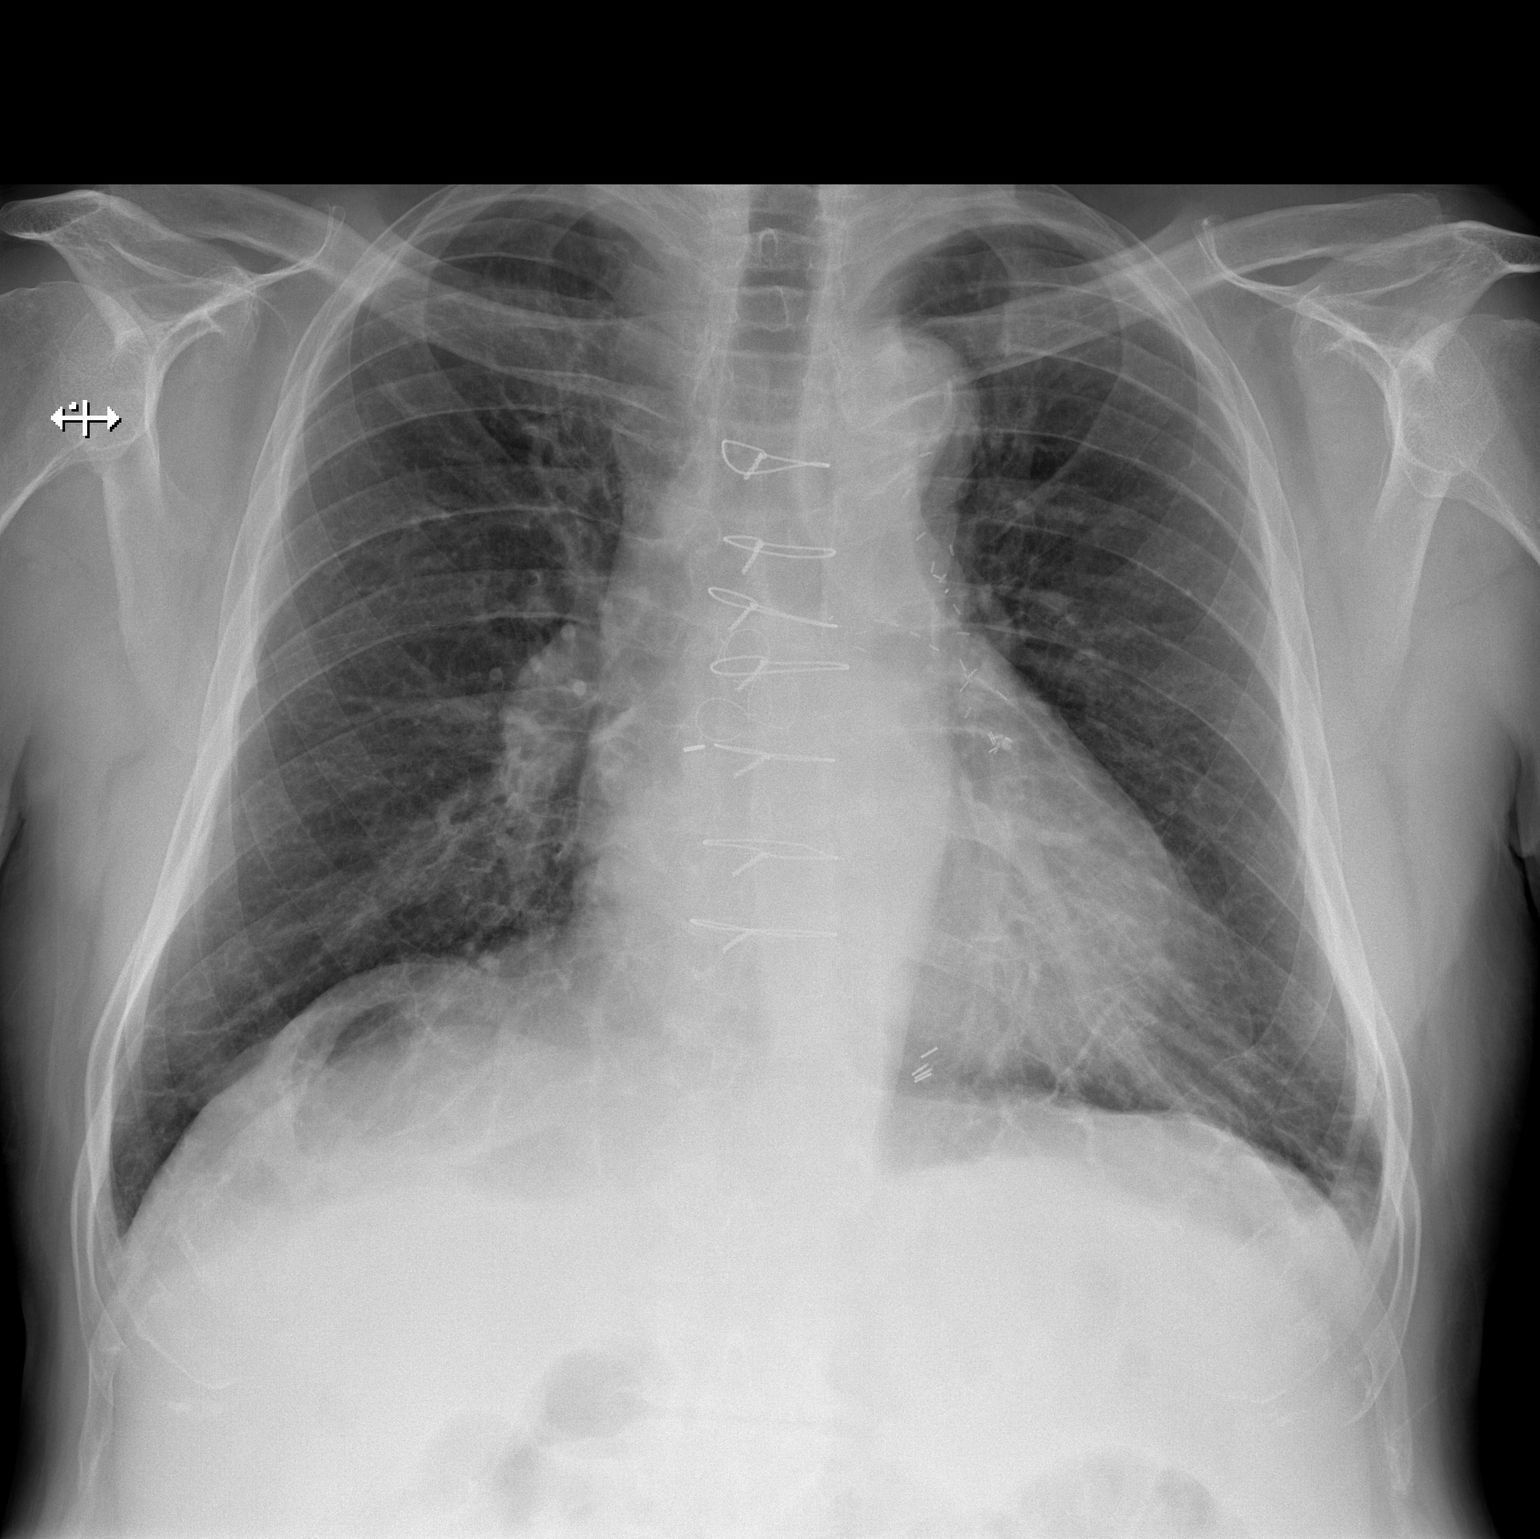

[w chest lat]
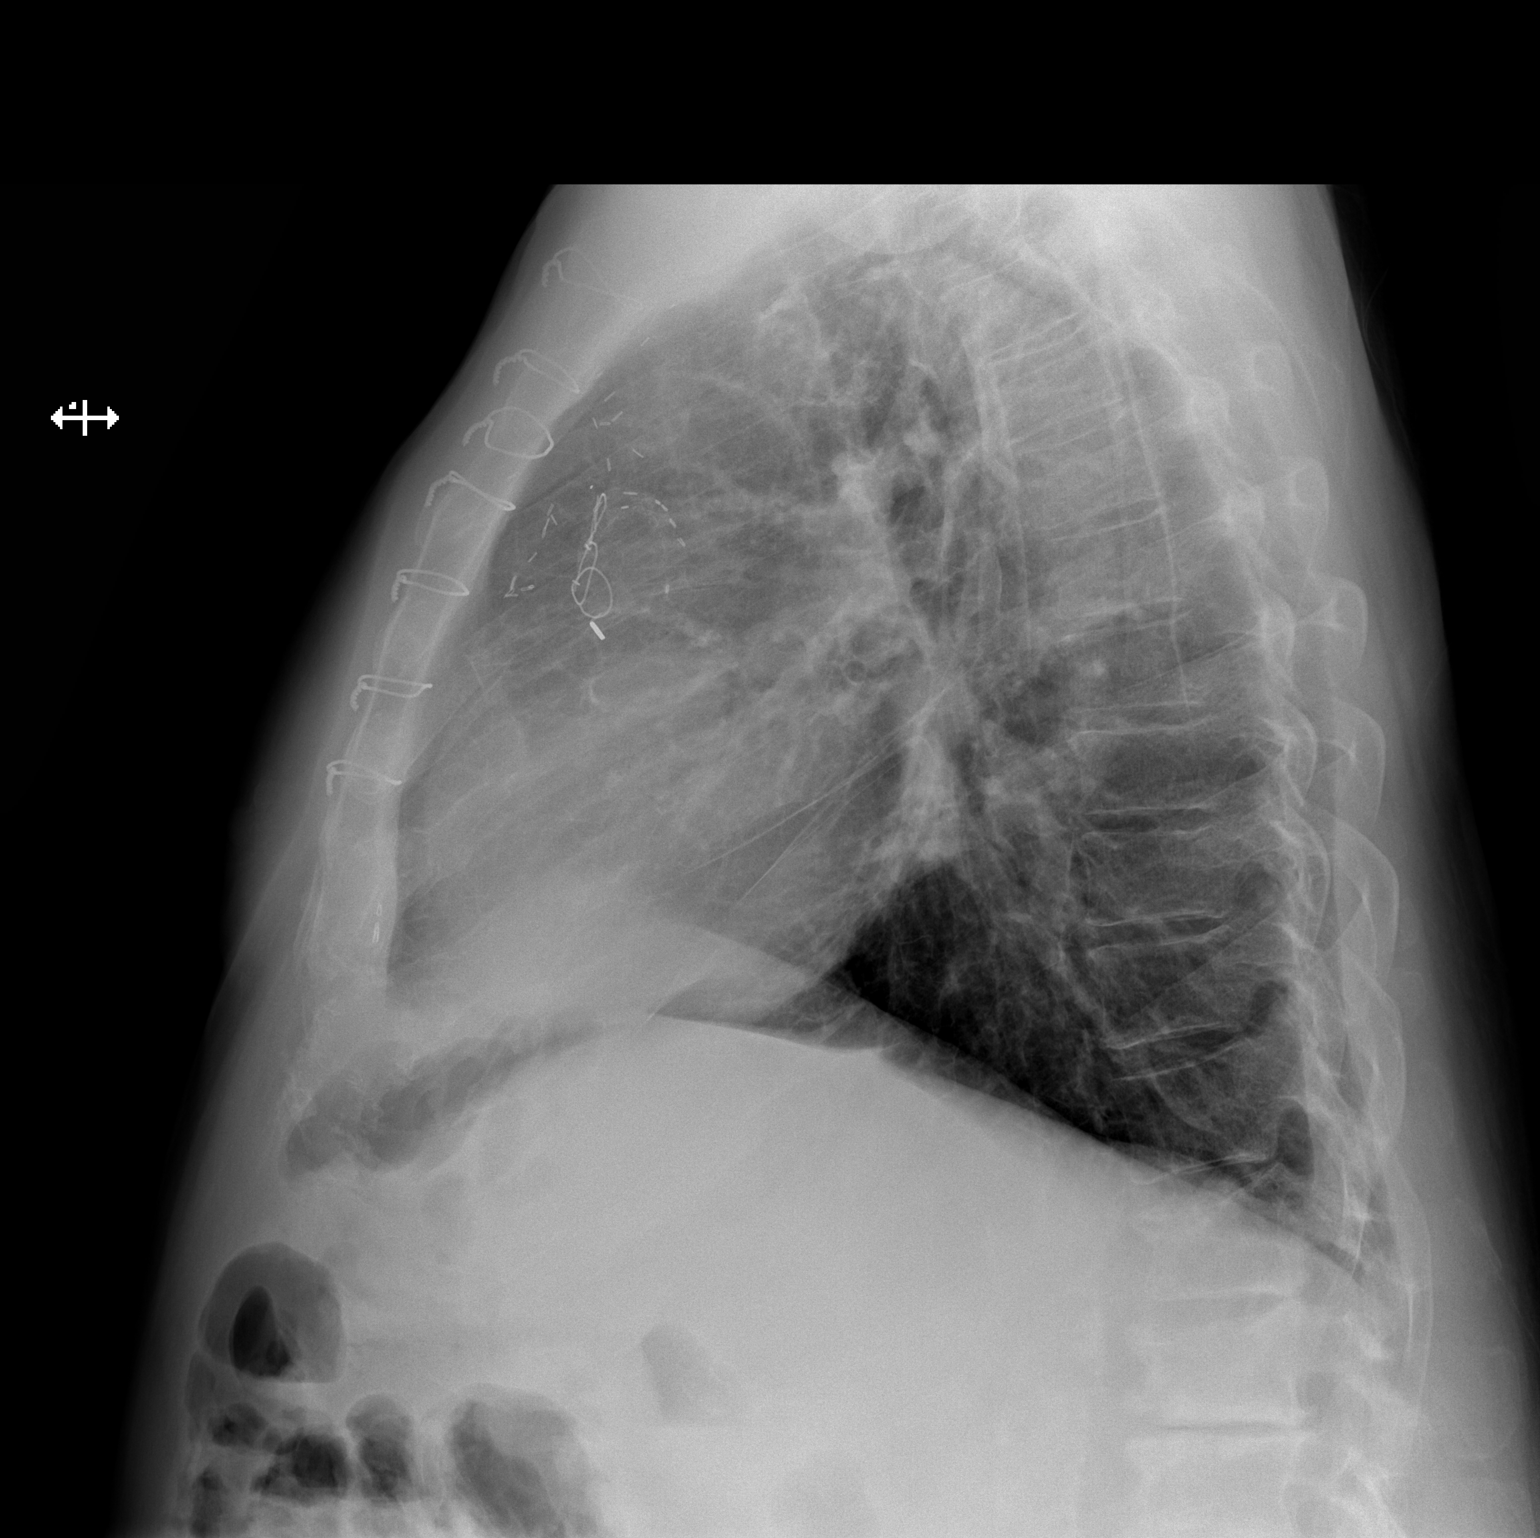

[2 of 2 positions shown; findings below may reference images not displayed]

FINDINGS: Few septal lines in the periphery of the lung bases with central
vascular congestion could reflect some mild interstitial edema. No
consolidation, pneumothorax, or effusion. Postsurgical changes
related to prior CABG including intact and aligned sternotomy wires
and multiple surgical clips projecting over the mediastinum. The
aorta is calcified. The remaining cardiomediastinal contours are
unremarkable. No acute osseous or soft tissue abnormality. Stable
compression deformities at L1 and L2. Degenerative changes are
present in the imaged spine and shoulders.
IMPRESSION: Mild interstitial edema. No other acute cardiopulmonary abnormality.

Prior CABG.

Unchanged L1, L2 compression deformities.

Aortic Atherosclerosis ([UA]-[UA]).

## 2019-11-10 MED ORDER — POTASSIUM CHLORIDE 2 MEQ/ML IV SOLN
80.0000 meq | INTRAVENOUS | Status: DC
  Filled 2019-11-10: qty 40

## 2019-11-10 MED ORDER — VANCOMYCIN HCL 1500 MG/300ML IV SOLN
1500.0000 mg | INTRAVENOUS | Status: AC
  Administered 2019-11-14: 1500 mg via INTRAVENOUS
  Filled 2019-11-10: qty 300

## 2019-11-10 MED ORDER — NOREPINEPHRINE 4 MG/250ML-% IV SOLN
0.0000 ug/min | INTRAVENOUS | Status: AC
  Administered 2019-11-14: 2 ug/min via INTRAVENOUS
  Filled 2019-11-10: qty 250

## 2019-11-10 MED ORDER — SODIUM CHLORIDE 0.9 % IV SOLN
1.5000 g | INTRAVENOUS | Status: AC
  Administered 2019-11-14: 1.5 g via INTRAVENOUS
  Filled 2019-11-10: qty 1.5

## 2019-11-10 MED ORDER — SODIUM CHLORIDE 0.9 % IV SOLN
INTRAVENOUS | Status: DC
  Filled 2019-11-10: qty 30

## 2019-11-10 MED ORDER — MAGNESIUM SULFATE 50 % IJ SOLN
40.0000 meq | INTRAMUSCULAR | Status: DC
  Filled 2019-11-10: qty 9.85

## 2019-11-10 MED ORDER — DEXMEDETOMIDINE HCL IN NACL 400 MCG/100ML IV SOLN
0.1000 ug/kg/h | INTRAVENOUS | Status: AC
  Administered 2019-11-14: 1 ug/kg/h via INTRAVENOUS
  Filled 2019-11-10: qty 100

## 2019-11-10 NOTE — Progress Notes (Signed)
Carotid duplex bilateral study completed.   See Cv Proc for preliminary results.   Jorge Cherry  

## 2019-11-10 NOTE — Anesthesia Preprocedure Evaluation (Addendum)
Anesthesia Evaluation  Patient identified by MRN, date of birth, ID band Patient awake    Reviewed: Allergy & Precautions, NPO status , Patient's Chart, lab work & pertinent test results  Airway Mallampati: II  TM Distance: >3 FB Neck ROM: Full    Dental   Pulmonary asthma , sleep apnea ,    breath sounds clear to auscultation       Cardiovascular hypertension, + angina + CAD, + CABG and + Peripheral Vascular Disease  + dysrhythmias Atrial Fibrillation + Valvular Problems/Murmurs AS  Rhythm:Irregular Rate:Normal + Systolic murmurs    Neuro/Psych negative neurological ROS     GI/Hepatic Neg liver ROS, GERD  ,  Endo/Other  negative endocrine ROS  Renal/GU Renal InsufficiencyRenal disease     Musculoskeletal  (+) Arthritis ,   Abdominal   Peds  Hematology  (+) anemia ,   Anesthesia Other Findings   Reproductive/Obstetrics                             Lab Results  Component Value Date   WBC 6.4 11/10/2019   HGB 11.3 (L) 11/10/2019   HCT 35.1 (L) 11/10/2019   MCV 99.2 11/10/2019   PLT 167 11/10/2019   Lab Results  Component Value Date   CREATININE 1.55 (H) 11/10/2019   BUN 28 (H) 11/10/2019   NA 142 11/10/2019   K 4.0 11/10/2019   CL 114 (H) 11/10/2019   CO2 21 (L) 11/10/2019    Anesthesia Physical Anesthesia Plan  ASA: IV  Anesthesia Plan: MAC   Post-op Pain Management:    Induction:   PONV Risk Score and Plan: 1 and Ondansetron, Treatment may vary due to age or medical condition and Dexamethasone  Airway Management Planned: Natural Airway and Simple Face Mask  Additional Equipment: Arterial line  Intra-op Plan:   Post-operative Plan:   Informed Consent: I have reviewed the patients History and Physical, chart, labs and discussed the procedure including the risks, benefits and alternatives for the proposed anesthesia with the patient or authorized representative who has  indicated his/her understanding and acceptance.       Plan Discussed with: CRNA  Anesthesia Plan Comments: (Per cardiology communication: "Plan tempo wire from the IJ, no neck lines")      Anesthesia Quick Evaluation

## 2019-11-10 NOTE — Progress Notes (Signed)
Anesthesia Chart Review:  Case: [id number] Date/Time: 11/14/19 0715   Procedures:      TRANSCATHETER AORTIC VALVE REPLACEMENT, TRANSFEMORAL (N/A Chest)     TRANSESOPHAGEAL ECHOCARDIOGRAM (TEE) (N/A )   Anesthesia type: General   Pre-op diagnosis: Severe Aortic Stenosis   [facility]: [facility]OM [facility] / [facility]   Surgeons: Kathleene Hazel, MD    CT Surgeon: Evelene Croon, MD   DISCUSSION: Patient is an 84 year old male scheduled for the above procedure.  History includes never smoker, CAD (s/p CABG 1994: LIMA-LAD, SVG-DIAG, SVG-OM, SVG-RCA; s/p stent SVG-DIAG 2005; occluded SVG-dRCA 2018, medical therapy), severe AS, PAF (on Eliquis), HTN, hypercholesterolemia, CKD (stage III), GERD, OSA (CPAP), iron deficiency anemia, asthma. No significant renal artery stenosis on 03/10/2013 renal artery ultrasound.  11/10/2019 CXR in process.  11/10/2019 presurgical COVID-19 test is in process.  Last Eliquis 11/08/2019.  He has a contrast allergy and appears that prednisone prescribed for him to take on 11/13/2019 and 11/14/2019.    Anesthesia team to evaluate on the day of surgery.  Per cardiology communication, "Plan temporal wire from the IJ, no neck lines".   VS: BP 139/80   Pulse 67   Temp (!) 36.3 C (Oral)   Resp 19   Ht 5\' 8"  (1.727 m)   Wt 88.7 kg   SpO2 98%   BMI 29.74 kg/m   PROVIDERS: , MD is PCP  Chilton Greathouse, MD is cardiologist   LABS: Labs reviewed: Acceptable for surgery. Cr 1.55 (1.35-1.73 since 03/13/18 per labs in [facility]). (all labs ordered are listed, but only abnormal results are displayed)  Labs Reviewed  BLOOD GAS, ARTERIAL - Abnormal; Notable for the following components:      Result Value   Acid-base deficit 2.5 (*)    Allens test (pass/fail) BRACHIAL ARTERY (*)    All other components within normal limits  BRAIN NATRIURETIC PEPTIDE - Abnormal; Notable for the following components:   B Natriuretic Peptide 371.6 (*)    All other components within normal  limits  CBC - Abnormal; Notable for the following components:   RBC 3.54 (*)    Hemoglobin 11.3 (*)    HCT 35.1 (*)    All other components within normal limits  COMPREHENSIVE METABOLIC PANEL - Abnormal; Notable for the following components:   Chloride 114 (*)    CO2 21 (*)    BUN 28 (*)    Creatinine, Ser 1.55 (*)    Total Protein 5.8 (*)    Albumin 3.3 (*)    ALT 50 (*)    GFR calc non Af Amer 41 (*)    GFR calc Af Amer 48 (*)    All other components within normal limits  PROTIME-INR - Abnormal; Notable for the following components:   Prothrombin Time 16.7 (*)    INR 1.4 (*)    All other components within normal limits  SURGICAL PCR SCREEN  APTT  HEMOGLOBIN A1C  URINALYSIS, ROUTINE W REFLEX MICROSCOPIC  TYPE AND SCREEN    Normal ventilatory function on 02/28/19 Spirometry.   IMAGES: CXR 11/10/19: In process.  CTA chest/abd/pelvis (ordered by 01/11/20, MD): IMPRESSION: 1. Vascular findings and measurements pertinent to potential TAVR procedure, as detailed above. In particular, please take note of the short segment dissections in the right external iliac and left common iliac arteries. 2. Severe thickening calcification of the aortic valve, compatible with the reported clinical history of severe aortic stenosis. 3. Aortic atherosclerosis, in addition to left main and  3 vessel coronary artery disease. Status post median sternotomy for CABG including LIMA to the LAD. 4. Multiple indeterminate lesions in the kidneys, most concerning of which measures 2.2 x 1.9 cm in the interpolar region of the left kidney. Further characterization with nonemergent abdominal MRI with and without IV gadolinium is strongly recommended in the near future to exclude the possibility of underlying renal neoplasm. 5. Nonobstructive calculi in the collecting systems of both kidneys measuring up to 4 mm in the lower pole collecting system of the right kidney. 6. Colonic  diverticulosis without evidence of acute diverticulitis at this time. 7. Additional incidental findings, as above. [See full report]   EKG: 11/10/19: Sinus rhythm with 1st degree A-V block Left axis deviation Non-specific intra-ventricular conduction block Left ventricular hypertrophy with repolarization abnormality ( R in aVL , Cornell product ) Abnormal ECG since last tracing, right bundle branch block, resolved Confirmed by Rinaldo Cloud ([id number]) on 11/10/2019 3:50:27 PM   CV: Carotid US 11/10/19: Summary:  Right Carotid: Velocities in the right ICA are consistent with a 1-39%  stenosis.  Left Carotid: Velocities in the left ICA are consistent with a 40-59%  stenosis.  Vertebrals: Bilateral vertebral arteries demonstrate antegrade flow.  Subclavians: Normal flow hemodynamics were seen in bilateral subclavian        arteries.    CT Coronary 11/06/19: IMPRESSION: 1. Trileaflet aortic valve with severely calcified leaflet with severe motion restriction and only minimal calcifications extending into the LVOT. Annular measurements suitable for delivery of a 26 mm Edwards-SAPIEN 3 Ultra valve. Aortic valve calcium score 3373 consistent with severe aortic stenosis. 2. Sufficient coronary to annulus distance. 3. Optimum Fluoroscopic Angle for Delivery: LAO 2 CAU 1 4. No thrombus in the left atrial appendage.   Cardiac cath 10/18/19:  Ost RCA to Dist RCA lesion is 100% stenosed.  Origin lesion is 100% stenosed.  Ost Cx to Prox Cx lesion is 100% stenosed.  Prox LAD to Mid LAD lesion is 100% stenosed with 100% stenosed side branch in 1st Diag.  Dist Graft lesion is 70% stenosed.  Prox Graft to Mid Graft lesion is 40% stenosed.  Previously placed Origin to Prox Graft stent (unknown type) is widely patent. - Severe native coronary restrictive disease with total occlusion of the proximal LAD prior to any diagonal vessel takeoff; total occlusion of the ostium of the left  circumflex coronary artery and total occlusion of the ostium of the native RCA. - Patent LIMA graft supplying the mid LAD. - Patent large SVG supplying the OM 2 vessel with filling of the circumflex to the OM1 vessel with 70% mid AV groove stenosis and evidence for collateralization to the distal RCA. - Patent stent in the proximal SVG supplying the diagonal vessel with mid graft narrowing of 40% and focal 70% distal graft stenosis. - Old occluded graft which had supplied the distal RCA. - Severe aortic stenosis with a mean transvalvular aortic gradient at 41 mm.\ - Mild right heart pressure elevation. RECOMMENDATION: Refer for TAVR consultation.  Medical therapy for his significant CAD.  Patient will resume Eliquis tomorrow.    Echo 08/14/19: 1. Left ventricular ejection fraction, by estimation, is 55 to 60%. The  left ventricle has normal function. The left ventricle has no regional  wall motion abnormalities. Left ventricular diastolic parameters are  consistent with Grade II diastolic  dysfunction (pseudonormalization).  2. Right ventricular systolic function is normal. The right ventricular  size is normal.  3. The mitral valve is normal in  structure. Mild mitral valve  regurgitation. No evidence of mitral stenosis.  4. Tricuspid valve regurgitation is moderate.  5. The aortic valve is tricuspid. Aortic valve regurgitation is not  visualized. Moderate to severe aortic valve stenosis. Aortic valve mean  gradient measures 35.0 mmHg.    Cardiac event monitor 03/04/18-03/18/18: The patient was monitored for 13 days and 20 hours from March 04, 2018 through March 18, 2018.  Predominant underlying rhythm was sinus rhythm with the slowest heart rate at 46 bpm and the maximum heart rate at 119 bpm.  The average heart rate was 70 bpm.  The patient had bundle branch block/IVCD and first-degree AV block.  The patient had a prolonged episode of atrial fibrillation which lasted 3 days and 14  hours from October 27 until March 09, 2018 with a rate ranging from 46 to 119 bpm.  With termination of the atrial fibrillation the patient had a prolonged pause lasting 3.1 seconds.  Atrial fibrillation burden was 25%.  There were rare isolated PACs (less than 1%) rare SVE couplets at less than 1%.  Isolated ventricular ectopy was rare less than 1%.  There were no couplets or triplets.   Past Medical History:  Diagnosis Date  . Arthritis    "just a touch in my hands" (11/24/2016)  . Asthma   . Atherosclerosis of renal artery (HCC)    RENAL DOPPLER, 12/10/2011 - Left renal artery demonstrated narrowing with elevated velocities consistent with a 1-59% diameter reduction  . CKD (chronic kidney disease) stage 3, GFR 30-59 ml/min    "stable now since they backed off the water pills" (11/24/2016)  . Coronary artery disease    a. 1994 s/p CABG x 4 (LIMA-LAD, VG->D2, VG->OM, VG->RCA); b. 02/2004 PCI SVG-D2 (3.5x16 Taxus DES). VG->RCA 100. Sev apical LAD dzs distal to LIMA insertion; c. 11/2016 Cath: LAD 95p/112m, D2 100ost, LCX 100ost, 70p/m, RCA 100p/m, RPDA fills via L->R collats. VG->RCA 100, VG->D2 20 ost, patent prox stent, 68m, LIMA->LAD ok, VG->OM3 20p. EF 55%-->Med Rx.  . GERD (gastroesophageal reflux disease)   . High cholesterol   . History of lower GI bleeding    a. 09/2014 GIB due to diverticulosis/diverticulitis.  . Iron deficiency anemia   . Labile Hypertension   . Moderate aortic stenosis    a. 10/2011 Echo:  EF >55%, mild-mod TR, mild-mod AS, mod Ca2+ of AoV leaflets; b. 07/2016 Echo: EF 60-65%, no rwma, Gr1 DD, mod AS [(S) mean grad , peak grad . Valve area (VTI): 1.33cm^2, (Vmax) 1.44cm^2. Mild MR]; c. 02/2018 Echo: EF 55-60%, no rwma, GR1 DD, Mod AS [Peak Vel (S): 319cm/s, Mean grad (S) , peak grad (S) 57mmHg].  [name] PAF (paroxysmal atrial fibrillation) (HCC)    a. CHA2DS2VASc = 4-->Eliquis.  . Sleep apnea     Past Surgical History:  Procedure Laterality Date  .  CARDIAC CATHETERIZATION  02/27/2004   Coronary intervention and medical management  . CARDIAC CATHETERIZATION  11/24/2016  . CATARACT EXTRACTION W/ INTRAOCULAR LENS IMPLANT Left   . CORONARY ANGIOPLASTY  1994 X 2   "before bypass surgery"  . CORONARY ANGIOPLASTY WITH STENT PLACEMENT  03/04/2004   SVG supplying the diagonal vessel stented with a 3.5x43mm Taxus stent post dilated to 4.0 mm  . CORONARY ARTERY BYPASS GRAFT  1994   "CABG X4"  . INGUINAL HERNIA REPAIR Right   . RIGHT HEART CATH AND CORONARY/GRAFT ANGIOGRAPHY N/A 11/24/2016   Procedure: Right Heart Cath and Coronary/Graft Angiography;  Surgeon: Lennette Bihari, MD;  Location: [facility];  Service: Cardiovascular;  Laterality: N/A;  . RIGHT/LEFT HEART CATH AND CORONARY/GRAFT ANGIOGRAPHY N/A 10/18/2019   Procedure: RIGHT/LEFT HEART CATH AND CORONARY/GRAFT ANGIOGRAPHY;  Surgeon: Lennette BihariKelly, Thomas A, MD;  Location: [facility];  Service: Cardiovascular;  Laterality: N/A;  . TONSILLECTOMY AND ADENOIDECTOMY      MEDICATIONS: . albuterol (VENTOLIN HFA) 108 (90 Base) MCG/ACT inhaler  . amiodarone (PACERONE) 200 MG tablet  . amLODipine (NORVASC) 2.5 MG tablet  . atorvastatin (LIPITOR) 20 MG tablet  . Azelastine-Fluticasone (DYMISTA) 137-50 MCG/ACT SUSP  . docusate sodium (COLACE) 100 MG capsule  . ELIQUIS 2.5 MG TABS tablet  . ezetimibe (ZETIA) 10 MG tablet  . fenofibrate (TRICOR) 145 MG tablet  . fluticasone (FLONASE) 50 MCG/ACT nasal spray  . fluticasone (FLOVENT HFA) 110 MCG/ACT inhaler  . ipratropium (ATROVENT) 0.06 % nasal spray  . isosorbide mononitrate (IMDUR) 60 MG 24 hr tablet  . levothyroxine (SYNTHROID) 25 MCG tablet  . loratadine (CLARITIN) 10 MG tablet  . Multiple Vitamins-Minerals (CENTRUM SILVER 50+MEN) TABS  . nitroGLYCERIN (NITROLINGUAL) 0.4 MG/SPRAY spray  . Omega-3 Fatty Acids (FISH OIL) 1000 MG CAPS  . pantoprazole (PROTONIX) 40 MG tablet  . predniSONE (DELTASONE) 50 MG tablet  . ranolazine (RANEXA)  500 MG 12 hr tablet  . vitamin C (ASCORBIC ACID) 500 MG tablet  . vitamin E 400 UNIT capsule  . zinc gluconate 50 MG tablet   No current facility-administered medications for this encounter.   Melene Muller. [START ON 11/14/2019] cefUROXime (ZINACEF) 1.5 g in sodium chloride 0.9 % 100 mL IVPB  . [START ON 11/14/2019] dexmedetomidine (PRECEDEX) 400 MCG/100ML (4 mcg/mL) infusion  . [START ON 11/14/2019] heparin 30,000 units/NS 1000 mL solution for CELLSAVER  . [START ON 11/14/2019] magnesium sulfate (IV Push/IM) injection 40 mEq  . [START ON 11/14/2019] norepinephrine (LEVOPHED) 4mg  in 250mL premix infusion  . [START ON 11/14/2019] potassium chloride injection 80 mEq  . [START ON 11/14/2019] vancomycin (VANCOREADY) IVPB 1500 mg/300 mL    Shonna ChockAllison Tashana Haberl, PA-C Surgical Short Stay/Anesthesiology [facility] Phone [phone] [facility] Phone [phone] 11/10/2019 5:19 PM

## 2019-11-10 NOTE — Progress Notes (Signed)
PCP Felipa Eth, MD Cardiologist Tresa Endo, MD    Chest x-ray - 11/10/19 EKG - 11/10/19 Stress Test - pt cannot recall where he got it but claims it was years ago  ECHO - 08/18/19 Cardiac Cath - 10/18/19   Sleep Study - yes CPAP - yes   Blood Thinner Instructions: see below Aspirin Instructions: see below  Per your surgeon's orders:   Stop taking Eliquis on 7/1. You will take your last dose on 6/30 (Wednesday).    Continue taking all other medications without change through the day before surgery.    On the morning of surgery DO NOT take any medications.    Prednisone 50 mg - take 13 hours prior to surgery (6:30 PM Monday)    Take another Prednisone 50 mg 7 hours prior to surgery (12:30 AM Tuesday)  ERAS Protcol - n/a PRE-SURGERY Ensure or G2- n/a  COVID TEST- 11/10/19  Coronavirus Screening  Have you experienced the following symptoms:  Cough yes/no: No Fever (>100.10F)  yes/no: No Runny nose yes/no: No Sore throat yes/no: No Difficulty breathing/shortness of breath  yes/no: No  Have you or a family member traveled in the last 14 days and where? yes/no: No   If the patient indicates "YES" to the above questions, their PAT will be rescheduled to limit the exposure to others and, the surgeon will be notified. THE PATIENT WILL NEED TO BE ASYMPTOMATIC FOR 14 DAYS.   If the patient is not experiencing any of these symptoms, the PAT nurse will instruct them to NOT bring anyone with them to their appointment since they may have these symptoms or traveled as well.   Please remind your patients and families that hospital visitation restrictions are in effect and the importance of the restrictions.     Anesthesia review: yes. Cardiac hx  Patient denies shortness of breath, fever, cough and chest pain at PAT appointment   All instructions explained to the patient, with a verbal understanding of the material. Patient agrees to go over the instructions while at home for a  better understanding. Patient also instructed to self quarantine after being tested for COVID-19. The opportunity to ask questions was provided.

## 2019-11-14 ENCOUNTER — Encounter (HOSPITAL_COMMUNITY)
Admission: RE | Disposition: A | Discharge status: Home / Self Care | Payer: Self-pay | Source: Home / Self Care | Attending: Surgery

## 2019-11-14 ENCOUNTER — Inpatient Hospital Stay (HOSPITAL_COMMUNITY): Payer: Medicare PPO

## 2019-11-14 ENCOUNTER — Encounter (HOSPITAL_COMMUNITY): Payer: Self-pay | Admitting: Anesthesiology

## 2019-11-14 ENCOUNTER — Inpatient Hospital Stay (HOSPITAL_COMMUNITY)
Admission: RE | Admit: 2019-11-14 | Discharge: 2019-11-15 | DRG: 266 | Disposition: A | Discharge status: Home / Self Care | Payer: Medicare PPO | Attending: Surgery | Admitting: Surgery

## 2019-11-14 ENCOUNTER — Inpatient Hospital Stay (HOSPITAL_COMMUNITY): Payer: Medicare PPO | Admitting: Vascular Surgery

## 2019-11-14 ENCOUNTER — Other Ambulatory Visit: Payer: Self-pay

## 2019-11-14 ENCOUNTER — Inpatient Hospital Stay (HOSPITAL_COMMUNITY): Payer: Medicare PPO | Admitting: Anesthesiology

## 2019-11-14 ENCOUNTER — Other Ambulatory Visit: Payer: Self-pay | Admitting: Physician Assistant

## 2019-11-14 ENCOUNTER — Encounter (HOSPITAL_COMMUNITY): Payer: Self-pay | Admitting: Cardiovascular Disease

## 2019-11-14 DIAGNOSIS — K219 Gastro-esophageal reflux disease without esophagitis: Secondary | ICD-10-CM | POA: Diagnosis not present

## 2019-11-14 DIAGNOSIS — D649 Anemia, unspecified: Secondary | ICD-10-CM | POA: Diagnosis not present

## 2019-11-14 DIAGNOSIS — R001 Bradycardia, unspecified: Secondary | ICD-10-CM

## 2019-11-14 DIAGNOSIS — Z952 Presence of prosthetic heart valve: Secondary | ICD-10-CM

## 2019-11-14 DIAGNOSIS — I5033 Acute on chronic diastolic (congestive) heart failure: Secondary | ICD-10-CM | POA: Diagnosis not present

## 2019-11-14 DIAGNOSIS — Z79890 Hormone replacement therapy (postmenopausal): Secondary | ICD-10-CM | POA: Diagnosis not present

## 2019-11-14 DIAGNOSIS — Z7901 Long term (current) use of anticoagulants: Secondary | ICD-10-CM | POA: Diagnosis not present

## 2019-11-14 DIAGNOSIS — N183 Chronic kidney disease, stage 3 unspecified: Secondary | ICD-10-CM | POA: Diagnosis present

## 2019-11-14 DIAGNOSIS — I2582 Chronic total occlusion of coronary artery: Secondary | ICD-10-CM | POA: Diagnosis not present

## 2019-11-14 DIAGNOSIS — I480 Paroxysmal atrial fibrillation: Secondary | ICD-10-CM | POA: Diagnosis not present

## 2019-11-14 DIAGNOSIS — Z8249 Family history of ischemic heart disease and other diseases of the circulatory system: Secondary | ICD-10-CM | POA: Diagnosis not present

## 2019-11-14 DIAGNOSIS — I454 Nonspecific intraventricular block: Secondary | ICD-10-CM | POA: Diagnosis not present

## 2019-11-14 DIAGNOSIS — I350 Nonrheumatic aortic (valve) stenosis: Secondary | ICD-10-CM

## 2019-11-14 DIAGNOSIS — N1831 Chronic kidney disease, stage 3a: Secondary | ICD-10-CM | POA: Diagnosis present

## 2019-11-14 DIAGNOSIS — E785 Hyperlipidemia, unspecified: Secondary | ICD-10-CM | POA: Diagnosis present

## 2019-11-14 DIAGNOSIS — I5032 Chronic diastolic (congestive) heart failure: Secondary | ICD-10-CM | POA: Diagnosis present

## 2019-11-14 DIAGNOSIS — Z79899 Other long term (current) drug therapy: Secondary | ICD-10-CM | POA: Diagnosis not present

## 2019-11-14 DIAGNOSIS — Z823 Family history of stroke: Secondary | ICD-10-CM

## 2019-11-14 DIAGNOSIS — I2510 Atherosclerotic heart disease of native coronary artery without angina pectoris: Secondary | ICD-10-CM | POA: Diagnosis present

## 2019-11-14 DIAGNOSIS — Z955 Presence of coronary angioplasty implant and graft: Secondary | ICD-10-CM | POA: Diagnosis not present

## 2019-11-14 DIAGNOSIS — Z951 Presence of aortocoronary bypass graft: Secondary | ICD-10-CM | POA: Diagnosis not present

## 2019-11-14 DIAGNOSIS — I10 Essential (primary) hypertension: Secondary | ICD-10-CM | POA: Diagnosis present

## 2019-11-14 DIAGNOSIS — I130 Hypertensive heart and chronic kidney disease with heart failure and stage 1 through stage 4 chronic kidney disease, or unspecified chronic kidney disease: Secondary | ICD-10-CM | POA: Diagnosis not present

## 2019-11-14 DIAGNOSIS — Z950 Presence of cardiac pacemaker: Secondary | ICD-10-CM | POA: Diagnosis not present

## 2019-11-14 DIAGNOSIS — I25810 Atherosclerosis of coronary artery bypass graft(s) without angina pectoris: Secondary | ICD-10-CM | POA: Diagnosis not present

## 2019-11-14 DIAGNOSIS — I700 Atherosclerosis of aorta: Secondary | ICD-10-CM | POA: Diagnosis not present

## 2019-11-14 DIAGNOSIS — Z954 Presence of other heart-valve replacement: Secondary | ICD-10-CM | POA: Diagnosis not present

## 2019-11-14 DIAGNOSIS — Z006 Encounter for examination for normal comparison and control in clinical research program: Secondary | ICD-10-CM | POA: Diagnosis not present

## 2019-11-14 DIAGNOSIS — J4530 Mild persistent asthma, uncomplicated: Secondary | ICD-10-CM | POA: Diagnosis not present

## 2019-11-14 DIAGNOSIS — N289 Disorder of kidney and ureter, unspecified: Secondary | ICD-10-CM

## 2019-11-14 HISTORY — PX: TRANSCATHETER AORTIC VALVE REPLACEMENT, TRANSFEMORAL: SHX6400

## 2019-11-14 HISTORY — PX: TEE WITHOUT CARDIOVERSION: SHX5443

## 2019-11-14 HISTORY — DX: Presence of prosthetic heart valve: Z95.2

## 2019-11-14 LAB — POCT I-STAT 7, (LYTES, BLD GAS, ICA,H+H)
Acid-base deficit: 4 mmol/L — ABNORMAL HIGH (ref 0.0–2.0)
Bicarbonate: 20 mmol/L (ref 20.0–28.0)
Calcium, Ion: 1.28 mmol/L (ref 1.15–1.40)
HCT: 33 % — ABNORMAL LOW (ref 39.0–52.0)
Hemoglobin: 11.2 g/dL — ABNORMAL LOW (ref 13.0–17.0)
O2 Saturation: 99 %
Potassium: 4.2 mmol/L (ref 3.5–5.1)
Sodium: 143 mmol/L (ref 135–145)
TCO2: 21 mmol/L — ABNORMAL LOW (ref 22–32)
pCO2 arterial: 32.3 mmHg (ref 32.0–48.0)
pH, Arterial: 7.4 (ref 7.350–7.450)
pO2, Arterial: 115 mmHg — ABNORMAL HIGH (ref 83.0–108.0)

## 2019-11-14 LAB — POCT I-STAT, CHEM 8
BUN: 17 mg/dL (ref 8–23)
BUN: 21 mg/dL (ref 8–23)
BUN: 17 mg/dL (ref 8–23)
Calcium, Ion: 1.3 mmol/L (ref 1.15–1.40)
Calcium, Ion: 1.22 mmol/L (ref 1.15–1.40)
Calcium, Ion: 1.27 mmol/L (ref 1.15–1.40)
Chloride: 109 mmol/L (ref 98–111)
Chloride: 108 mmol/L (ref 98–111)
Chloride: 108 mmol/L (ref 98–111)
Creatinine, Ser: 1.1 mg/dL (ref 0.61–1.24)
Creatinine, Ser: 1.1 mg/dL (ref 0.61–1.24)
Creatinine, Ser: 1 mg/dL (ref 0.61–1.24)
Glucose, Bld: 156 mg/dL — ABNORMAL HIGH (ref 70–99)
Glucose, Bld: 148 mg/dL — ABNORMAL HIGH (ref 70–99)
Glucose, Bld: 145 mg/dL — ABNORMAL HIGH (ref 70–99)
HCT: 34 % — ABNORMAL LOW (ref 39.0–52.0)
HCT: 33 % — ABNORMAL LOW (ref 39.0–52.0)
HCT: 32 % — ABNORMAL LOW (ref 39.0–52.0)
Hemoglobin: 11.6 g/dL — ABNORMAL LOW (ref 13.0–17.0)
Hemoglobin: 11.2 g/dL — ABNORMAL LOW (ref 13.0–17.0)
Hemoglobin: 10.9 g/dL — ABNORMAL LOW (ref 13.0–17.0)
Potassium: 4.2 mmol/L (ref 3.5–5.1)
Potassium: 5.2 mmol/L — ABNORMAL HIGH (ref 3.5–5.1)
Potassium: 4.2 mmol/L (ref 3.5–5.1)
Sodium: 141 mmol/L (ref 135–145)
Sodium: 141 mmol/L (ref 135–145)
Sodium: 143 mmol/L (ref 135–145)
TCO2: 19 mmol/L — ABNORMAL LOW (ref 22–32)
TCO2: 23 mmol/L (ref 22–32)
TCO2: 22 mmol/L (ref 22–32)

## 2019-11-14 LAB — POCT ACTIVATED CLOTTING TIME
Activated Clotting Time: 306 seconds
Activated Clotting Time: 121 seconds

## 2019-11-14 IMAGING — DX DG CHEST 1V PORT
1 series · 1 of 1 positions shown · non-contrast
Comparison: [DATE]

CLINICAL DATA: Status post aortic valve replacement

EXAM:
PORTABLE CHEST 1 VIEW

[chest]
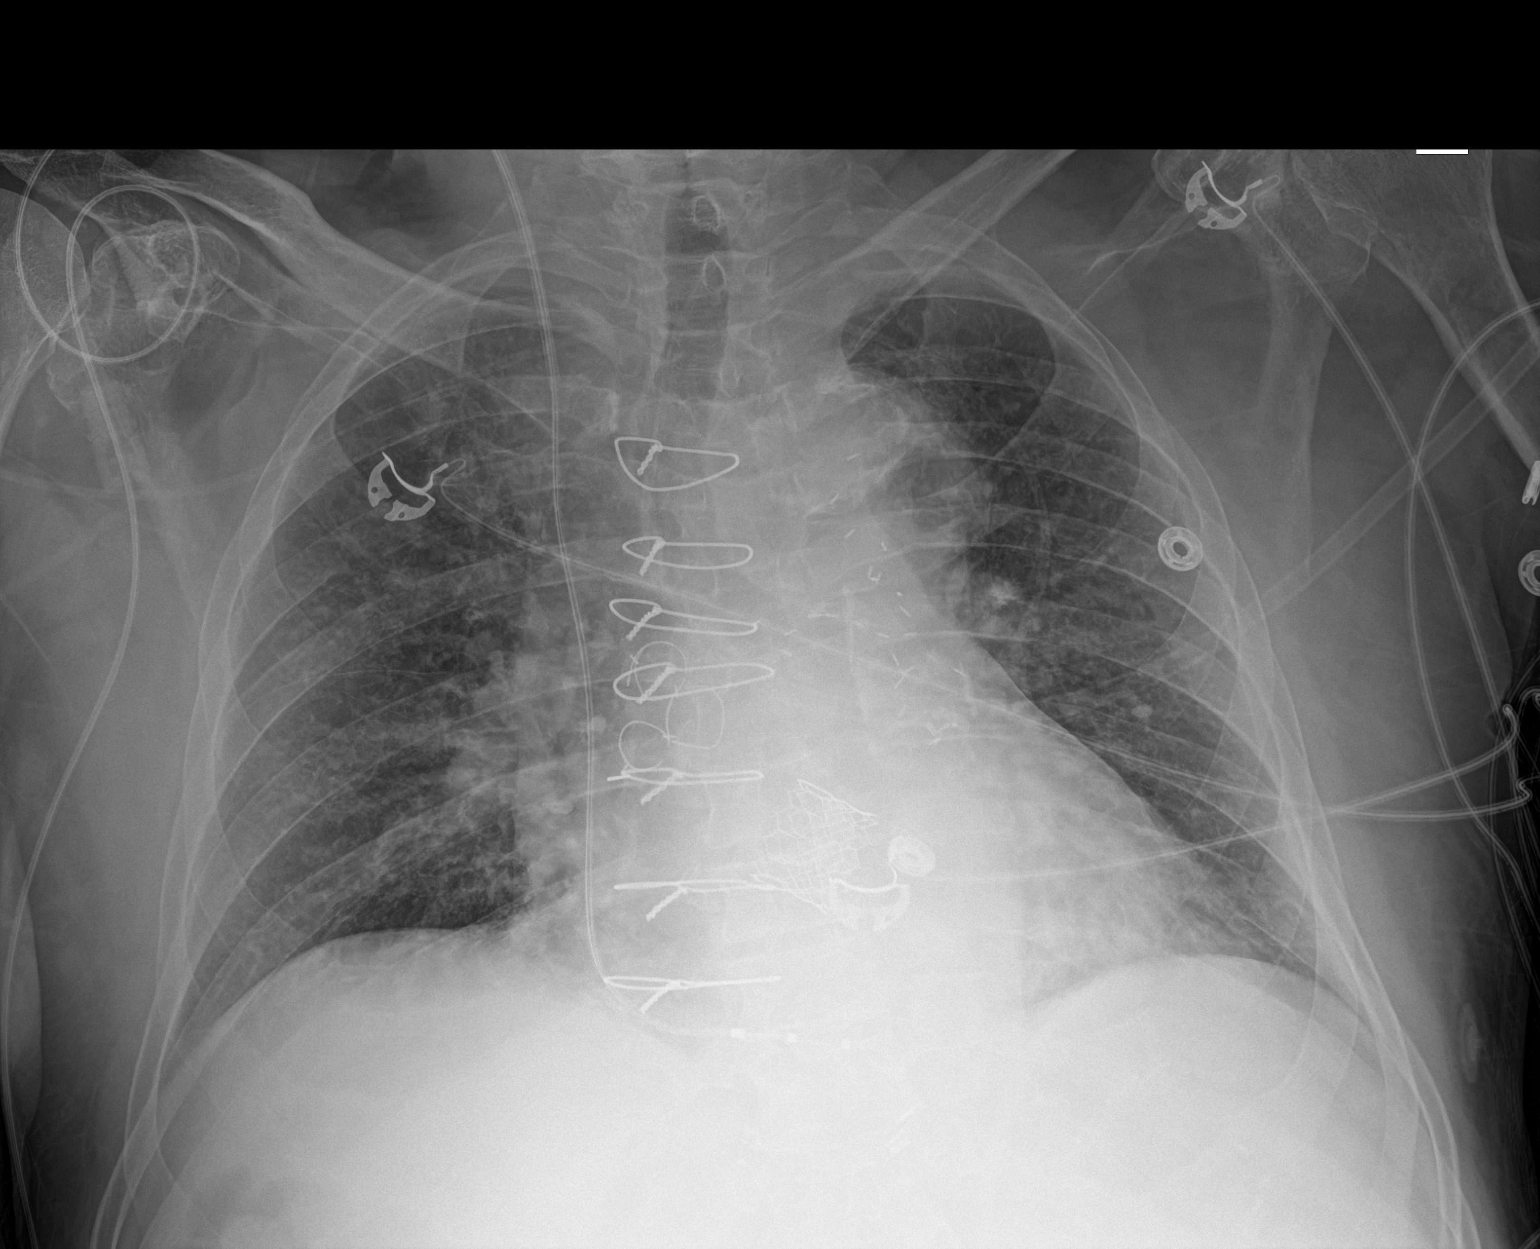

[1 of 1 positions shown; findings below may reference images not displayed]

FINDINGS: The lungs are clear. Heart is upper normal in size with pulmonary
vascularity normal. There is a prosthetic aortic valve. Patient is
status post coronary artery bypass grafting. Pacemaker lead is
attached to the right ventricle. There is aortic atherosclerosis. No
adenopathy. No pneumothorax. No bone lesions.
IMPRESSION: Postoperative changes. Lungs clear. Heart upper normal in size. No
pneumothorax.

Aortic Atherosclerosis ([DM]-[DM]).

## 2019-11-14 SURGERY — IMPLANTATION, AORTIC VALVE, TRANSCATHETER, FEMORAL APPROACH
Anesthesia: General | Site: Chest

## 2019-11-14 SURGERY — IMPLANTATION, AORTIC VALVE, TRANSCATHETER, FEMORAL APPROACH
Anesthesia: Monitor Anesthesia Care | Site: Chest

## 2019-11-14 MED ORDER — LIDOCAINE HCL 1 % IJ SOLN
INTRAMUSCULAR | Status: DC | PRN
  Administered 2019-11-14: 25 mL

## 2019-11-14 MED ORDER — LEVOTHYROXINE SODIUM 25 MCG PO TABS
25.0000 ug | ORAL_TABLET | Freq: Every day | ORAL | Status: DC
  Administered 2019-11-15: 25 ug via ORAL
  Filled 2019-11-14: qty 1

## 2019-11-14 MED ORDER — SODIUM CHLORIDE 0.9 % IV SOLN: 250.0000 mL | INTRAVENOUS | Status: DC | PRN

## 2019-11-14 MED ORDER — PANTOPRAZOLE SODIUM 40 MG PO TBEC
40.0000 mg | DELAYED_RELEASE_TABLET | Freq: Every day | ORAL | Status: DC
  Administered 2019-11-15: 40 mg via ORAL
  Filled 2019-11-14: qty 1

## 2019-11-14 MED ORDER — SODIUM CHLORIDE 0.9 % IV SOLN
1.5000 g | Freq: Two times a day (BID) | INTRAVENOUS | Status: DC
  Administered 2019-11-14 – 2019-11-15 (×2): 1.5 g via INTRAVENOUS
  Filled 2019-11-14 (×3): qty 1.5

## 2019-11-14 MED ORDER — CHLORHEXIDINE GLUCONATE 4 % EX LIQD: 60.0000 mL | Freq: Once | CUTANEOUS | Status: DC

## 2019-11-14 MED ORDER — EZETIMIBE 10 MG PO TABS: 10.0000 mg | ORAL_TABLET | Freq: Every day | ORAL | Status: DC

## 2019-11-14 MED ORDER — VANCOMYCIN HCL IN DEXTROSE 1-5 GM/200ML-% IV SOLN
1000.0000 mg | Freq: Once | INTRAVENOUS | Status: AC
  Administered 2019-11-14: 1000 mg via INTRAVENOUS
  Filled 2019-11-14: qty 200

## 2019-11-14 MED ORDER — ACETAMINOPHEN 500 MG PO TABS
1000.0000 mg | ORAL_TABLET | Freq: Once | ORAL | Status: AC
  Administered 2019-11-14: 1000 mg via ORAL
  Filled 2019-11-14: qty 2

## 2019-11-14 MED ORDER — LORATADINE 10 MG PO TABS: 10.0000 mg | ORAL_TABLET | Freq: Every evening | ORAL | Status: DC

## 2019-11-14 MED ORDER — PROPOFOL 10 MG/ML IV BOLUS
INTRAVENOUS | Status: DC | PRN
  Administered 2019-11-14: 10 mg via INTRAVENOUS

## 2019-11-14 MED ORDER — AMLODIPINE BESYLATE 5 MG PO TABS
2.5000 mg | ORAL_TABLET | Freq: Two times a day (BID) | ORAL | Status: DC
  Administered 2019-11-14 – 2019-11-15 (×2): 2.5 mg via ORAL
  Filled 2019-11-14 (×3): qty 1

## 2019-11-14 MED ORDER — EZETIMIBE 10 MG PO TABS
10.0000 mg | ORAL_TABLET | Freq: Every day | ORAL | Status: DC
  Administered 2019-11-15: 10 mg via ORAL
  Filled 2019-11-14: qty 1

## 2019-11-14 MED ORDER — PHENYLEPHRINE HCL-NACL 20-0.9 MG/250ML-% IV SOLN: 0.0000 ug/min | INTRAVENOUS | Status: DC

## 2019-11-14 MED ORDER — SODIUM CHLORIDE 0.9 % IV SOLN: INTRAVENOUS | Status: AC

## 2019-11-14 MED ORDER — TRAMADOL HCL 50 MG PO TABS: 50.0000 mg | ORAL_TABLET | ORAL | Status: DC | PRN

## 2019-11-14 MED ORDER — SODIUM CHLORIDE 0.9 % IV SOLN
INTRAVENOUS | Status: DC | PRN
  Administered 2019-11-14 (×3): 500 mL via INTRAMUSCULAR

## 2019-11-14 MED ORDER — ATORVASTATIN CALCIUM 10 MG PO TABS
20.0000 mg | ORAL_TABLET | Freq: Every day | ORAL | Status: DC
  Administered 2019-11-14: 20 mg via ORAL
  Filled 2019-11-14: qty 2

## 2019-11-14 MED ORDER — RANOLAZINE ER 500 MG PO TB12
500.0000 mg | ORAL_TABLET | Freq: Two times a day (BID) | ORAL | Status: DC
  Administered 2019-11-14 – 2019-11-15 (×2): 500 mg via ORAL
  Filled 2019-11-14 (×2): qty 1

## 2019-11-14 MED ORDER — FENTANYL CITRATE (PF) 250 MCG/5ML IJ SOLN
INTRAMUSCULAR | Status: AC
  Filled 2019-11-14: qty 5

## 2019-11-14 MED ORDER — PROTAMINE SULFATE 10 MG/ML IV SOLN
INTRAVENOUS | Status: DC | PRN
Start: 2019-11-14 — End: 2019-11-14
  Administered 2019-11-14: 10 mg via INTRAVENOUS
  Administered 2019-11-14: 130 mg via INTRAVENOUS

## 2019-11-14 MED ORDER — DOCUSATE SODIUM 100 MG PO CAPS
100.0000 mg | ORAL_CAPSULE | Freq: Every day | ORAL | Status: DC
  Administered 2019-11-15: 100 mg via ORAL
  Filled 2019-11-14: qty 1

## 2019-11-14 MED ORDER — PROPOFOL 10 MG/ML IV BOLUS
INTRAVENOUS | Status: AC
  Filled 2019-11-14: qty 20

## 2019-11-14 MED ORDER — AZELASTINE-FLUTICASONE 137-50 MCG/ACT NA SUSP: 1.0000 | Freq: Every day | NASAL | Status: DC | PRN

## 2019-11-14 MED ORDER — LACTATED RINGERS IV SOLN: INTRAVENOUS | Status: DC | PRN

## 2019-11-14 MED ORDER — CHLORHEXIDINE GLUCONATE CLOTH 2 % EX PADS
6.0000 | MEDICATED_PAD | Freq: Every day | CUTANEOUS | Status: DC
  Administered 2019-11-14: 6 via TOPICAL

## 2019-11-14 MED ORDER — AMIODARONE HCL 200 MG PO TABS
200.0000 mg | ORAL_TABLET | Freq: Every day | ORAL | Status: DC
  Administered 2019-11-15: 200 mg via ORAL
  Filled 2019-11-14: qty 1

## 2019-11-14 MED ORDER — 0.9 % SODIUM CHLORIDE (POUR BTL) OPTIME
TOPICAL | Status: DC | PRN
Start: 2019-11-14 — End: 2019-11-14
  Administered 2019-11-14: 1000 mL

## 2019-11-14 MED ORDER — SODIUM CHLORIDE 0.9% FLUSH
3.0000 mL | Freq: Two times a day (BID) | INTRAVENOUS | Status: DC
  Administered 2019-11-14 – 2019-11-15 (×2): 3 mL via INTRAVENOUS

## 2019-11-14 MED ORDER — CHLORHEXIDINE GLUCONATE 4 % EX LIQD: 30.0000 mL | CUTANEOUS | Status: DC

## 2019-11-14 MED ORDER — HEPARIN (PORCINE) IN NACL 1000-0.9 UT/500ML-% IV SOLN
INTRAVENOUS | Status: AC
  Filled 2019-11-14: qty 1500

## 2019-11-14 MED ORDER — MORPHINE SULFATE (PF) 2 MG/ML IV SOLN: 1.0000 mg | INTRAVENOUS | Status: DC | PRN

## 2019-11-14 MED ORDER — OXYCODONE HCL 5 MG PO TABS: 5.0000 mg | ORAL_TABLET | ORAL | Status: DC | PRN

## 2019-11-14 MED ORDER — ONDANSETRON HCL 4 MG/2ML IJ SOLN
INTRAMUSCULAR | Status: DC | PRN
  Administered 2019-11-14: 4 mg via INTRAVENOUS

## 2019-11-14 MED ORDER — FENOFIBRATE 54 MG PO TABS
54.0000 mg | ORAL_TABLET | Freq: Every day | ORAL | Status: DC
  Administered 2019-11-15: 54 mg via ORAL
  Filled 2019-11-14: qty 1

## 2019-11-14 MED ORDER — DIPHENHYDRAMINE HCL 25 MG PO CAPS
50.0000 mg | ORAL_CAPSULE | Freq: Once | ORAL | Status: AC
  Administered 2019-11-14: 50 mg via ORAL
  Filled 2019-11-14: qty 2

## 2019-11-14 MED ORDER — ISOSORBIDE MONONITRATE ER 30 MG PO TB24
30.0000 mg | ORAL_TABLET | Freq: Every day | ORAL | Status: DC
  Administered 2019-11-14: 30 mg via ORAL
  Filled 2019-11-14: qty 1

## 2019-11-14 MED ORDER — CHLORHEXIDINE GLUCONATE 0.12 % MT SOLN
15.0000 mL | Freq: Once | OROMUCOSAL | Status: AC
  Administered 2019-11-14: 15 mL via OROMUCOSAL
  Filled 2019-11-14: qty 15

## 2019-11-14 MED ORDER — SODIUM CHLORIDE 0.9 % IV SOLN
INTRAVENOUS | Status: DC | PRN
  Administered 2019-11-14: 1500 mL

## 2019-11-14 MED ORDER — LIDOCAINE HCL (PF) 1 % IJ SOLN
INTRAMUSCULAR | Status: AC
  Filled 2019-11-14: qty 30

## 2019-11-14 MED ORDER — SODIUM CHLORIDE 0.9 % IV SOLN
INTRAVENOUS | Status: AC
  Filled 2019-11-14 (×3): qty 1.2

## 2019-11-14 MED ORDER — ACETAMINOPHEN 650 MG RE SUPP: 650.0000 mg | Freq: Four times a day (QID) | RECTAL | Status: DC | PRN

## 2019-11-14 MED ORDER — HEPARIN SODIUM (PORCINE) 1000 UNIT/ML IJ SOLN
INTRAMUSCULAR | Status: DC | PRN
  Administered 2019-11-14: 14000 [IU] via INTRAVENOUS

## 2019-11-14 MED ORDER — ISOSORBIDE MONONITRATE ER 30 MG PO TB24
90.0000 mg | ORAL_TABLET | Freq: Every day | ORAL | Status: DC
  Administered 2019-11-14 – 2019-11-15 (×2): 90 mg via ORAL
  Filled 2019-11-14 (×2): qty 3

## 2019-11-14 MED ORDER — ACETAMINOPHEN 325 MG PO TABS: 650.0000 mg | ORAL_TABLET | Freq: Four times a day (QID) | ORAL | Status: DC | PRN

## 2019-11-14 MED ORDER — SODIUM CHLORIDE 0.9% FLUSH: 3.0000 mL | INTRAVENOUS | Status: DC | PRN

## 2019-11-14 MED ORDER — MIDAZOLAM HCL 2 MG/2ML IJ SOLN
INTRAMUSCULAR | Status: AC
  Filled 2019-11-14: qty 2

## 2019-11-14 MED ORDER — PREDNISONE 20 MG PO TABS
50.0000 mg | ORAL_TABLET | Freq: Once | ORAL | Status: AC
  Administered 2019-11-14: 50 mg via ORAL
  Filled 2019-11-14: qty 3

## 2019-11-14 MED ORDER — ASPIRIN 81 MG PO CHEW
81.0000 mg | CHEWABLE_TABLET | Freq: Every day | ORAL | Status: DC
  Administered 2019-11-15: 81 mg via ORAL
  Filled 2019-11-14: qty 1

## 2019-11-14 MED ORDER — AMLODIPINE BESYLATE 5 MG PO TABS: 2.5000 mg | ORAL_TABLET | Freq: Two times a day (BID) | ORAL | Status: DC

## 2019-11-14 MED ORDER — ONDANSETRON HCL 4 MG/2ML IJ SOLN: 4.0000 mg | Freq: Four times a day (QID) | INTRAMUSCULAR | Status: DC | PRN

## 2019-11-14 MED ORDER — SODIUM CHLORIDE 0.9 % IV SOLN: INTRAVENOUS | Status: DC

## 2019-11-14 MED ORDER — ISOSORBIDE MONONITRATE ER 30 MG PO TB24: 90.0000 mg | ORAL_TABLET | Freq: Every day | ORAL | Status: DC

## 2019-11-14 MED ORDER — LEVOTHYROXINE SODIUM 25 MCG PO TABS: 25.0000 ug | ORAL_TABLET | Freq: Every day | ORAL | Status: DC

## 2019-11-14 MED ORDER — NITROGLYCERIN IN D5W 200-5 MCG/ML-% IV SOLN: 0.0000 ug/min | INTRAVENOUS | Status: DC

## 2019-11-14 MED ORDER — IODIXANOL 320 MG/ML IV SOLN
INTRAVENOUS | Status: DC | PRN
  Administered 2019-11-14: 80 mL

## 2019-11-14 MED ORDER — LACTATED RINGERS IV SOLN
INTRAVENOUS | Status: DC | PRN
Start: 2019-11-14 — End: 2019-11-14

## 2019-11-14 SURGICAL SUPPLY — 80 items
BAG DECANTER FOR FLEXI CONT (MISCELLANEOUS) IMPLANT
BAG SNAP BAND KOVER 36X36 (MISCELLANEOUS) IMPLANT
BLADE CLIPPER SURG (BLADE) IMPLANT
BLADE OSCILLATING /SAGITTAL (BLADE) IMPLANT
BLADE STERNUM SYSTEM 6 (BLADE) IMPLANT
BLADE SURG 10 STRL SS (BLADE) IMPLANT
CABLE ADAPT CONN TEMP 6FT (ADAPTER) IMPLANT
CATH DIAG EXPO 6F AL1 (CATHETERS) IMPLANT
CATH DIAG EXPO 6F VENT PIG 145 (CATHETERS) IMPLANT
CATH EXTERNAL FEMALE PUREWICK (CATHETERS) IMPLANT
CATH INFINITI 6F AL2 (CATHETERS) IMPLANT
CATH S G BIP PACING (CATHETERS) IMPLANT
CHLORAPREP W/TINT 26 (MISCELLANEOUS) IMPLANT
CLIP VESOCCLUDE MED 24/CT (CLIP) IMPLANT
CLIP VESOCCLUDE SM WIDE 24/CT (CLIP) IMPLANT
CNTNR URN SCR LID CUP LEK RST (MISCELLANEOUS) IMPLANT
CONT SPEC 4OZ STRL OR WHT (MISCELLANEOUS)
COVER BACK TABLE 80X110 HD (DRAPES) IMPLANT
COVER WAND RF STERILE (DRAPES) IMPLANT
DECANTER SPIKE VIAL GLASS SM (MISCELLANEOUS) IMPLANT
DERMABOND ADVANCED (GAUZE/BANDAGES/DRESSINGS)
DERMABOND ADVANCED .7 DNX12 (GAUZE/BANDAGES/DRESSINGS) IMPLANT
DEVICE CLOSURE PERCLS PRGLD 6F (VASCULAR PRODUCTS) IMPLANT
DRAPE INCISE IOBAN 66X45 STRL (DRAPES) IMPLANT
DRSG TEGADERM 4X4.75 (GAUZE/BANDAGES/DRESSINGS) IMPLANT
ELECT CAUTERY BLADE 6.4 (BLADE) IMPLANT
ELECT REM PT RETURN 9FT ADLT (ELECTROSURGICAL)
ELECTRODE REM PT RTRN 9FT ADLT (ELECTROSURGICAL) IMPLANT
FELT TEFLON 6X6 (MISCELLANEOUS) IMPLANT
GAUZE SPONGE 4X4 12PLY STRL (GAUZE/BANDAGES/DRESSINGS) IMPLANT
GLOVE BIO SURGEON STRL SZ7.5 (GLOVE) IMPLANT
GLOVE BIO SURGEON STRL SZ8 (GLOVE) IMPLANT
GLOVE EUDERMIC 7 POWDERFREE (GLOVE) IMPLANT
GLOVE ORTHO TXT STRL SZ7.5 (GLOVE) IMPLANT
GLOVE TRIUMPH SURG SIZE 7.0 (KITS) IMPLANT
GOWN STRL REUS W/ TWL LRG LVL3 (GOWN DISPOSABLE) IMPLANT
GOWN STRL REUS W/ TWL XL LVL3 (GOWN DISPOSABLE) IMPLANT
GOWN STRL REUS W/TWL LRG LVL3 (GOWN DISPOSABLE)
GOWN STRL REUS W/TWL XL LVL3 (GOWN DISPOSABLE)
GUIDEWIRE SAFE TJ AMPLATZ EXST (WIRE) IMPLANT
INSERT FOGARTY SM (MISCELLANEOUS) IMPLANT
KIT BASIN OR (CUSTOM PROCEDURE TRAY) IMPLANT
KIT HEART LEFT (KITS) IMPLANT
KIT SUCTION CATH 14FR (SUCTIONS) IMPLANT
KIT TURNOVER KIT B (KITS) IMPLANT
LOOP VESSEL MAXI BLUE (MISCELLANEOUS) IMPLANT
LOOP VESSEL MINI RED (MISCELLANEOUS) IMPLANT
NS IRRIG 1000ML POUR BTL (IV SOLUTION) IMPLANT
PACK ENDO MINOR (CUSTOM PROCEDURE TRAY) IMPLANT
PAD ARMBOARD 7.5X6 YLW CONV (MISCELLANEOUS) IMPLANT
PAD ELECT DEFIB RADIOL ZOLL (MISCELLANEOUS) IMPLANT
PENCIL BUTTON HOLSTER BLD 10FT (ELECTRODE) IMPLANT
PERCLOSE PROGLIDE 6F (VASCULAR PRODUCTS)
POSITIONER HEAD DONUT 9IN (MISCELLANEOUS) IMPLANT
SET MICROPUNCTURE 5F STIFF (MISCELLANEOUS) IMPLANT
SHEATH BRITE TIP 7FR 35CM (SHEATH) IMPLANT
SHEATH PINNACLE 6F 10CM (SHEATH) IMPLANT
SHEATH PINNACLE 8F 10CM (SHEATH) IMPLANT
SLEEVE REPOSITIONING LENGTH 30 (MISCELLANEOUS) IMPLANT
STOPCOCK MORSE 400PSI 3WAY (MISCELLANEOUS) IMPLANT
SUT ETHIBOND X763 2 0 SH 1 (SUTURE) IMPLANT
SUT GORETEX CV 4 TH 22 36 (SUTURE) IMPLANT
SUT GORETEX CV4 TH-18 (SUTURE) IMPLANT
SUT MNCRL AB 3-0 PS2 18 (SUTURE) IMPLANT
SUT PROLENE 5 0 C 1 36 (SUTURE) IMPLANT
SUT PROLENE 6 0 C 1 30 (SUTURE) IMPLANT
SUT SILK  1 MH (SUTURE)
SUT SILK 1 MH (SUTURE) IMPLANT
SUT VIC AB 2-0 CT1 27 (SUTURE)
SUT VIC AB 2-0 CT1 TAPERPNT 27 (SUTURE) IMPLANT
SUT VIC AB 2-0 CTX 36 (SUTURE) IMPLANT
SUT VIC AB 3-0 SH 8-18 (SUTURE) IMPLANT
SYR 50ML LL SCALE MARK (SYRINGE) IMPLANT
SYR BULB IRRIG 60ML STRL (SYRINGE) IMPLANT
SYR MEDRAD MARK V 150ML (SYRINGE) IMPLANT
TOWEL GREEN STERILE (TOWEL DISPOSABLE) IMPLANT
TRANSDUCER W/STOPCOCK (MISCELLANEOUS) IMPLANT
TRAY FOLEY SLVR 16FR TEMP STAT (SET/KITS/TRAYS/PACK) IMPLANT
WIRE EMERALD 3MM-J .035X150CM (WIRE) IMPLANT
WIRE EMERALD 3MM-J .035X260CM (WIRE) IMPLANT

## 2019-11-14 SURGICAL SUPPLY — 28 items
CABLE ADAPT PACING TEMP 12FT (ADAPTER) ×4 IMPLANT
CATH 26 ULTRA DELIVERY (CATHETERS) ×4 IMPLANT
CATH DIAG 6FR PIGTAIL ANGLED (CATHETERS) ×8 IMPLANT
CATH INFINITI 5 FR AL2 (CATHETERS) ×4 IMPLANT
CATH INFINITI 6F AL1 (CATHETERS) ×4 IMPLANT
CATH INFINITI 6F AL2 (CATHETERS) ×4 IMPLANT
CATH TEMPO TEMP PACE LEAD (CATHETERS) ×2 IMPLANT
CATHETER TEMPO TEMP PACE LEAD (CATHETERS) ×4
CLOSURE MYNX CONTROL 6F/7F (Vascular Products) ×4 IMPLANT
CRIMPER (MISCELLANEOUS) ×4 IMPLANT
DEVICE CLOSURE PERCLS PRGLD 6F (VASCULAR PRODUCTS) ×4 IMPLANT
DEVICE INFLATION ATRION QL2530 (MISCELLANEOUS) ×4 IMPLANT
GUIDEWIRE SAFE TJ AMPLATZ EXST (WIRE) ×4 IMPLANT
KIT MICROPUNCTURE NIT STIFF (SHEATH) ×4 IMPLANT
PERCLOSE PROGLIDE 6F (VASCULAR PRODUCTS) ×8
SHEATH 14X36 EDWARDS (SHEATH) ×4 IMPLANT
SHEATH PINNACLE 6F 10CM (SHEATH) ×8 IMPLANT
SHEATH PINNACLE 7F 10CM (SHEATH) ×4 IMPLANT
SHEATH PINNACLE 8F 10CM (SHEATH) ×8 IMPLANT
SHEATH PROBE COVER 6X72 (BAG) ×4 IMPLANT
SHIELD RADPAD SCOOP 12X17 (MISCELLANEOUS) ×4 IMPLANT
SLEEVE REPOSITIONING LENGTH 30 (MISCELLANEOUS) ×4 IMPLANT
TUBE CONN 8.8X1320 FR HP M-F (CONNECTOR) ×4 IMPLANT
TUBING CIL FLEX 10 FLL-RA (TUBING) ×4 IMPLANT
VALVE 26 ULTRA SAPIEN KIT (Valve) ×4 IMPLANT
WIRE AMPLATZ SS-J .035X180CM (WIRE) ×4 IMPLANT
WIRE EMERALD 3MM-J .035X150CM (WIRE) ×4 IMPLANT
WIRE EMERALD ST .035X260CM (WIRE) ×4 IMPLANT

## 2019-11-14 NOTE — H&P (Signed)
[address]       [place] [address]             [phone]      Cardiothoracic Surgery Admission History and Physical   Referring Provider is Lennette Bihari, MD  Primary Cardiologist is Nicki Guadalajara, MD  PCP is Chilton Greathouse, MD      Chief Complaint  Patient presents with  . Aortic Stenosis       HPI:  The patient is an 84 year old gentleman with a history of hypertension, hyperlipidemia, coronary artery disease status post CABG by Dr. Tyrone Sage in 1994, paroxysmal atrial fibrillation on Eliquis, stage III chronic kidney disease, and severe aortic stenosis who presents with progressive fatigue and dyspnea on exertion. Is also has bilateral lower extremity edema and some dizziness. He reports some exertional chest discomfort. An echocardiogram on 08/14/2019 showed a calcified and thickened aortic valve with restricted mobility. The mean gradient was 35 mmHg with a peak gradient of 67 mmHg and an aortic valve area of 1 cm. Dimensionless index of 0.3. Left ventricular ejection fraction 55 to 60% with mild mitral regurgitation.  The patient is married and lives with his wife.  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      Past Medical History:  Diagnosis Date  . Arthritis    "just a touch in my hands" (11/24/2016)  . Asthma   . Atherosclerosis of renal artery (HCC)    RENAL DOPPLER, 12/10/2011 - Left renal artery demonstrated narrowing with elevated velocities consistent with a 1-59% diameter reduction  . CKD (chronic kidney disease) stage 3, GFR 30-59 ml/min    "stable now since they backed off the water pills" (11/24/2016)  . Coronary artery disease    a. 1994 s/p CABG x 4 (LIMA-LAD, VG->D2, VG->OM, VG->RCA); b. 02/2004 PCI SVG-D2 (3.5x16 Taxus DES). VG->RCA 100. Sev apical LAD dzs distal to LIMA insertion; c. 11/2016 Cath: LAD 95p/143m, D2 100ost, LCX 100ost, 70p/m, RCA 100p/m, RPDA fills via L->R collats. VG->RCA 100, VG->D2 20 ost, patent  prox stent, 38m, LIMA->LAD ok, VG->OM3 20p. EF 55%-->Med Rx.  . GERD (gastroesophageal reflux disease)   . High cholesterol   . History of lower GI bleeding    a. 09/2014 GIB due to diverticulosis/diverticulitis.  . Iron deficiency anemia   . Labile Hypertension   . Moderate aortic stenosis    a. 10/2011 Echo: EF >55%, mild-mod TR, mild-mod AS, mod Ca2+ of AoV leaflets; b. 07/2016 Echo: EF 60-65%, no rwma, Gr1 DD, mod AS [(S) mean grad , peak grad . Valve area (VTI): 1.33cm^2, (Vmax) 1.44cm^2. Mild MR]; c. 02/2018 Echo: EF 55-60%, no rwma, GR1 DD, Mod AS [Peak Vel (S): 319cm/s, Mean grad (S) , peak grad (S) 25mmHg].  [name] PAF (paroxysmal atrial fibrillation) (HCC)    a. CHA2DS2VASc = 4-->Eliquis.        Past Surgical History:  Procedure Laterality Date  . CARDIAC CATHETERIZATION  02/27/2004   Coronary intervention and medical management  . CARDIAC CATHETERIZATION  11/24/2016  . CATARACT EXTRACTION W/ INTRAOCULAR LENS IMPLANT Left   . CORONARY ANGIOPLASTY  1994 X 2   "before bypass surgery"  . CORONARY ANGIOPLASTY WITH STENT PLACEMENT  03/04/2004   SVG supplying the diagonal vessel stented with a 3.5x22mm Taxus stent post dilated to 4.0 mm  . CORONARY ARTERY BYPASS GRAFT  1994   "CABG X4"  . INGUINAL HERNIA REPAIR Right   . RIGHT HEART CATH  AND CORONARY/GRAFT ANGIOGRAPHY N/A 11/24/2016   Procedure: Right Heart Cath and Coronary/Graft Angiography; Surgeon: Lennette Bihari, MD; Location: [facility] INVASIVE CV LAB; Service: Cardiovascular; Laterality: N/A;  . RIGHT/LEFT HEART CATH AND CORONARY/GRAFT ANGIOGRAPHY N/A 10/18/2019   Procedure: RIGHT/LEFT HEART CATH AND CORONARY/GRAFT ANGIOGRAPHY; Surgeon: Lennette Bihari, MD; Location: [facility] INVASIVE CV LAB; Service: Cardiovascular; Laterality: N/A;  . TONSILLECTOMY AND ADENOIDECTOMY          Family History  Problem Relation Age of Onset  . Cancer Mother   . Heart disease Father    died age 70  . Stroke Maternal Grandmother   . Cancer  Maternal Grandfather   . Allergic rhinitis Neg Hx   . Angioedema Neg Hx   . Asthma Neg Hx   . Atopy Neg Hx   . Eczema Neg Hx   . Immunodeficiency Neg Hx   . Urticaria Neg Hx    Social History        Socioeconomic History  . Marital status: Married    Spouse name: Not on file  . Number of children: 2  . Years of education: Not on file  . Highest education level: Not on file  Occupational History  . Not on file  Tobacco Use  . Smoking status: Never Smoker  . Smokeless tobacco: Never Used  Vaping Use  . Vaping Use: Never used  Substance and Sexual Activity  . Alcohol use: No    Alcohol/week: 0.0 standard drinks  . Drug use: No  . Sexual activity: Not Currently  Other Topics Concern  . Not on file  Social History Narrative   Lives at home in [place] with wife. Retired from [employer].    Social Determinants of Health      Financial Resource Strain:   . Difficulty of Paying Living Expenses:   Food Insecurity:   . Worried About Programme researcher, broadcasting/film/video in the Last Year:   . Barista in the Last Year:   Transportation Needs:   . Freight forwarder (Medical):   [name] Lack of Transportation (Non-Medical):   Physical Activity:   . Days of Exercise per Week:   . Minutes of Exercise per Session:   Stress:   . Feeling of Stress :   Social Connections:   . Frequency of Communication with Friends and Family:   . Frequency of Social Gatherings with Friends and Family:   . Attends Religious Services:   . Active Member of Clubs or Organizations:   . Attends Banker Meetings:   [name] Marital Status:   Intimate Partner Violence:   . Fear of Current or Ex-Partner:   . Emotionally Abused:   [name] Physically Abused:   . Sexually Abused:          Current Outpatient Medications  Medication Sig Dispense Refill  . albuterol (VENTOLIN HFA) 108 (90 Base) MCG/ACT inhaler INHALE TWO PUFFS EVERY 4-6 HOURS AS NEEDED FOR COUGH OR WHEEZE (Patient taking  differently: Inhale 2 puffs into the lungs every 4 (four) hours as needed for wheezing or shortness of breath. ) 8.5 g 1  . amiodarone (PACERONE) 200 MG tablet Take 1 tablet (200 mg total) by mouth daily. 90 tablet 3  . amLODipine (NORVASC) 2.5 MG tablet Take 1 tablet (2.5 mg total) by mouth 2 (two) times daily. NEED OV. 180 tablet 0  . atorvastatin (LIPITOR) 20 MG tablet Take 1 tablet (20 mg total) by mouth at bedtime. 30 tablet 0  .  Azelastine-Fluticasone (DYMISTA) 137-50 MCG/ACT SUSP Place 1-2 sprays into the nose daily as needed (for seasonal allergies).    . docusate sodium (COLACE) 100 MG capsule Take 100 mg by mouth daily.     [name] ELIQUIS 2.5 MG TABS tablet TAKE 1 TABLET BY MOUTH TWICE A DAY 180 tablet 1  . ezetimibe (ZETIA) 10 MG tablet TAKE 1 TABLET BY MOUTH EVERY DAY (Patient taking differently: Take 10 mg by mouth daily. TAKE 1 TABLET BY MOUTH EVERY DAY) 90 tablet 2  . fenofibrate (TRICOR) 145 MG tablet Take 0.5 tablets (72.5 mg total) by mouth daily. 90 tablet 1  . fluticasone (FLONASE) 50 MCG/ACT nasal spray Place 2 sprays into both nostrils daily.     . fluticasone (FLOVENT HFA) 110 MCG/ACT inhaler Inhale 1 puff into the lungs 3 (three) times daily as needed (for seasonal flares).     [name] ipratropium (ATROVENT) 0.06 % nasal spray Can use two sprays in each nostril every six hours as needed to dry up nose. (Patient taking differently: Place 2 sprays into both nostrils 3 (three) times daily. ) 15 mL 5  . isosorbide mononitrate (IMDUR) 60 MG 24 hr tablet TAKE 1 AND 1/2 TABS (90MG ) BY MOUTH EVERY MORNING AND 1/2 TAB EVERY EVENING (Patient taking differently: Take 30-90 mg by mouth See admin instructions. TAKE 1 AND 1/2 TABS (90MG ) BY MOUTH EVERY MORNING AND 1/2 TAB EVERY EVENING) 180 tablet 3  . levothyroxine (SYNTHROID) 25 MCG tablet Take 25 mcg by mouth daily before breakfast.     . loratadine (CLARITIN) 10 MG tablet Take 10 mg by mouth every evening.     . Multiple Vitamins-Minerals (CENTRUM  SILVER 50+MEN) TABS Take 1 tablet by mouth at bedtime.    . nitroGLYCERIN (NITROLINGUAL) 0.4 MG/SPRAY spray Place 1 spray under the tongue every 5 (five) minutes x 3 doses as needed for chest pain. 4.9 g 2  . Omega-3 Fatty Acids (FISH OIL) 1000 MG CAPS Take 1,000 mg by mouth 2 (two) times daily.     . pantoprazole (PROTONIX) 40 MG tablet TAKE 1 TABLET BY MOUTH EVERY DAY (Patient taking differently: Take 40 mg by mouth daily. ) 90 tablet 1  . predniSONE (DELTASONE) 50 MG tablet Take as directed prior to TAVR surgery 2 tablet 0  . ranolazine (RANEXA) 500 MG 12 hr tablet TAKE 1 TABLET BY MOUTH TWICE A DAY (Patient taking differently: Take 500 mg by mouth 2 (two) times daily. ) 180 tablet 3  . vitamin C (ASCORBIC ACID) 500 MG tablet Take 500 mg by mouth at bedtime.     . vitamin E 400 UNIT capsule Take 400 Units by mouth daily.    [name] zinc gluconate 50 MG tablet Take 50 mg by mouth 2 (two) times a week.     No current facility-administered medications for this visit.        Allergies  Allergen Reactions  . Altace [Ramipril] Swelling and Other (See Comments)    Mouth swelling  . Mucinex [Guaifenesin Er] Hives, Swelling and Other (See Comments)    Mouth swelling  . Contrast Media [Iodinated Diagnostic Agents] Other (See Comments)    Made eyes change each time   Review of Systems:   General: normal appetite, + reduced energy, no weight gain, no weight loss, no fever  Cardiac: + chest pain with exertion, no chest pain at rest, +SOB with mild exertion, no resting SOB, no PND, no orthopnea, no palpitations, + arrhythmia, + atrial fibrillation, + LE edema, +  dizzy spells, no syncope  Respiratory: + exertional shortness of breath, no home oxygen, no productive cough, no dry cough, no bronchitis, no wheezing, no hemoptysis, no asthma, no pain with inspiration or cough, + sleep apnea, + CPAP at night  GI: no difficulty swallowing, + reflux, no frequent heartburn, no hiatal hernia, no abdominal pain, no  constipation, no diarrhea, no hematochezia, no hematemesis, no melena  GU: no dysuria, no frequency, no urinary tract infection, no hematuria, + enlarged prostate, no kidney stones, + chronic kidney disease  Vascular: no pain suggestive of claudication, no pain in feet, no leg cramps, no varicose veins, no DVT, no non-healing foot ulcer  Neuro: no stroke, no TIA's, no seizures, no headaches, no temporary blindness one eye, no slurred speech, no peripheral neuropathy, no chronic pain, no instability of gait, no memory/cognitive dysfunction  Musculoskeletal: no arthritis, no joint swelling, no myalgias, no difficulty walking, normal mobility  Skin: no rash, no itching, no skin infections, no pressure sores or ulcerations  Psych: no anxiety, no depression, no nervousness, no unusual recent stress  Eyes: no blurry vision, no floaters, + recent vision changes, + wears glasses or contacts  ENT: + hearing loss, no loose or painful teeth, + dentures  Hematologic: no easy bruising, no abnormal bleeding, no clotting disorder, no frequent epistaxis  Endocrine: no diabetes, does not check CBG's at home    Physical Exam:   BP (!) 153/75 (BP Location: Left Arm, Patient Position: Sitting, Cuff Size: Normal)  Pulse 67  Temp 98.7 F (37.1 C)  Resp 20  Ht 5\' 8"  (1.727 m)  Wt 194 lb 6.4 oz (88.2 kg)  SpO2 94% Comment: RA  BMI 29.56 kg/m  General: Looks younger than age, well-appearing  HEENT: Unremarkable, NCAT, PERLA, EOMI  Neck: no JVD, no bruits, no adenopathy  Chest: clear to auscultation, symmetrical breath sounds, no wheezes, no rhonchi  CV: RRR, grade lll/VI crescendo/decrescendo murmur heard best at RSB, no diastolic murmur  Abdomen: soft, non-tender, no masses  Extremities: warm, well-perfused, pulses not palpable at ankle due to edema, bilateral LE edema  Rectal/GU Deferred  Neuro: Grossly non-focal and symmetrical throughout  Skin: Clean and dry, no rashes, no breakdown    Diagnostic  Tests:   ECHOCARDIOGRAM REPORT     Patient Name: KAVIR SAVOCA Date of Exam: 08/14/2019  Medical Rec #: [id number] Height: 68.0 in  Accession #: [id number] Weight: 195.6 lb  Date of Birth: 1936/01/07 BSA: 2.025 m  Patient Age: 78 years BP: 130/80 mmHg  Patient Gender: M HR: 61 bpm.  Exam Location: [address]   Procedure: 2D Echo, Cardiac Doppler and Color Doppler   Indications: I50.22 Chronic systolic (congestive) heart failure   History: Patient has prior history of Echocardiogram examinations,  most  recent 03/03/2018. Prior CABG, Arrythmias:Atrial  Fibrillation;  Risk Factors:Hypertension and Dyslipidemia.   Sonographer: Luvenia Redden RDCS  Referring Phys: [phone] HAO MENG   IMPRESSIONS    1. Left ventricular ejection fraction, by estimation, is 55 to 60%. The  left ventricle has normal function. The left ventricle has no regional  wall motion abnormalities. Left ventricular diastolic parameters are  consistent with Grade II diastolic  dysfunction (pseudonormalization).  2. Right ventricular systolic function is normal. The right ventricular  size is normal.  3. The mitral valve is normal in structure. Mild mitral valve  regurgitation. No evidence of mitral stenosis.  4. Tricuspid valve regurgitation is moderate.  5. The aortic valve is tricuspid. Aortic valve regurgitation is  not  visualized. Moderate to severe aortic valve stenosis. Aortic valve mean  gradient measures 35.0 mmHg.   FINDINGS  Left Ventricle: Left ventricular ejection fraction, by estimation, is 55  to 60%. The left ventricle has normal function. The left ventricle has no  regional wall motion abnormalities. The left ventricular internal cavity  size was normal in size. There is  borderline left ventricular hypertrophy. Left ventricular diastolic  parameters are consistent with Grade II diastolic dysfunction  (pseudonormalization).   Right Ventricle: The right ventricular size is normal. No  increase in  right ventricular wall thickness. Right ventricular systolic function is  normal.   Left Atrium: Left atrial size was normal in size.   Right Atrium: Right atrial size was normal in size.   Pericardium: There is no evidence of pericardial effusion.   Mitral Valve: The mitral valve is normal in structure. Mild mitral valve  regurgitation. No evidence of mitral valve stenosis.   Tricuspid Valve: The tricuspid valve is grossly normal. Tricuspid valve  regurgitation is moderate . No evidence of tricuspid stenosis.   Aortic Valve: The aortic valve is tricuspid. . There is mild thickening  and moderate calcification of the aortic valve. Aortic valve regurgitation  is not visualized. Moderate to severe aortic stenosis is present. Severe  aortic valve annular  calcification. There is mild thickening of the aortic valve. There is  moderate calcification of the aortic valve. Aortic valve mean gradient  measures 35.0 mmHg. Aortic valve peak gradient measures 67.1 mmHg. Aortic  valve area, by VTI measures 1.05 cm.   Pulmonic Valve: The pulmonic valve was grossly normal. Pulmonic valve  regurgitation is not visualized. No evidence of pulmonic stenosis.   Aorta: The aortic root and ascending aorta are structurally normal, with  no evidence of dilitation.   IAS/Shunts: The atrial septum is grossly normal.    LEFT VENTRICLE  PLAX 2D  LVIDd: 5.90 cm Diastology  LVIDs: 4.30 cm LV e' lateral: 5.44 cm/s  LV PW: 1.50 cm LV E/e' lateral: 16.8  LV IVS: 1.10 cm LV e' medial: 6.09 cm/s  LVOT diam: 2.10 cm LV E/e' medial: 15.0  LV SV: 100  LV SV Index: 49  LVOT Area: 3.46 cm    RIGHT VENTRICLE  RV S prime: 4.35 cm/s  TAPSE (M-mode): 1.9 cm   LEFT ATRIUM Index RIGHT ATRIUM Index  LA diam: 4.10 cm 2.03 cm/m RA Area: 14.90 cm  LA Vol (A2C): 73.3 ml 36.21 ml/m RA Volume: 30.20 ml 14.92 ml/m  LA Vol (A4C): 62.6 ml 30.92 ml/m  LA Biplane Vol: 70.2 ml 34.67 ml/m  AORTIC  VALVE  AV Area (Vmax): 1.04 cm  AV Area (Vmean): 1.01 cm  AV Area (VTI): 1.05 cm  AV Vmax: 409.50 cm/s  AV Vmean: 264.000 cm/s  AV VTI: 0.951 m  AV Peak Grad: 67.1 mmHg  AV Mean Grad: 35.0 mmHg  LVOT Vmax: 123.00 cm/s  LVOT Vmean: 77.200 cm/s  LVOT VTI: 0.289 m  LVOT/AV VTI ratio: 0.30   AORTA  Ao Root diam: 4.30 cm   MITRAL VALVE  MV Area (PHT): 4.57 cm SHUNTS  MV Decel Time: 166 msec Systemic VTI: 0.29 m  MV E velocity: 91.30 cm/s Systemic Diam: 2.10 cm  MV A velocity: 94.30 cm/s  MV E/A ratio: 0.97   Kristeen Miss MD  Electronically signed by Kristeen Miss MD  Signature Date/Time: 08/14/2019/1:36:55 PM       Panel Physicians Referring Physician Case Authorizing Physician  Lennette Bihari,  MD (Primary)    Procedures  RIGHT/LEFT HEART CATH AND CORONARY/GRAFT ANGIOGRAPHY  Conclusion  Ost RCA to Dist RCA lesion is 100% stenosed.  Origin lesion is 100% stenosed.  Ost Cx to Prox Cx lesion is 100% stenosed.  Prox LAD to Mid LAD lesion is 100% stenosed with 100% stenosed side branch in 1st Diag.  Dist Graft lesion is 70% stenosed.  Prox Graft to Mid Graft lesion is 40% stenosed.  Previously placed Origin to Prox Graft stent (unknown type) is widely patent. Severe native coronary restrictive disease with total occlusion of the proximal LAD prior to any diagonal vessel takeoff; total occlusion of the ostium of the left circumflex coronary artery and total occlusion of the ostium of the native RCA.  Patent LIMA graft supplying the mid LAD.  Patent large SVG supplying the OM 2 vessel with filling of the circumflex to the OM1 vessel with 70% mid AV groove stenosis and evidence for collateralization to the distal RCA.  Patent stent in the proximal SVG supplying the diagonal vessel with mid graft narrowing of 40% and focal 70% distal graft stenosis.  Old occluded graft which had supplied the distal RCA.  Severe aortic stenosis with a mean transvalvular aortic gradient at 41  mm.\  Mild right heart pressure elevation.  RECOMMENDATION:  I reviewed the angiographic findings with Dr. Excell Seltzer. Will refer for TAVR consultation. Medical therapy for his significant CAD. Patient will resume Eliquis tomorrow.  Recommendations  Antiplatelet/Anticoag Recommend to resume Apixaban, at currently prescribed dose and frequency on 10/19/2019.  Indications  Nonrheumatic aortic valve stenosis [I35.0 (ICD-10-CM)]  Coronary artery disease involving native coronary artery of native heart with other form of angina pectoris (HCC) [W43.154 (ICD-10-CM)]  Procedural Details  Technical Details Jorge Cherry is an 84 year old gentleman who underwent CABG revascularization surgery in 1994 and had a LIMA placed to the mid LAD, and SVG to the circumflex marginal, and SVG to second diagonal vessel, and the SVG to the RCA. In 2003 he underwent stenting in the proximal SVG graft supplying the diagonal vessel. Over the years he has developed aortic valve stenosis. He developed increasing symptomatology of chest pain and shortness of breath in 2018 at a time he had significant anemia and GI blood loss. Repeat catheterization showed severe native CAD with 95% proximal LAD stenosis just prior to the first diagonal vessel with total occlusion of the LAD after the third septal perforating artery and before the takeoff of the second diagonal branch; total occlusion of the circumflex at the ostium; and total proximal RCA occlusion. He had a patent LIMA to the mid LAD which did not supply the proximal diagonal vessel, a patent vein graft supplying the distal marginal vessels with filling retrograde to the circumflex which also collateralized the distal RCA, a patent vein graft supplying the second diagonal vessel with a patent proximal stent, and an occluded vein graft which had supplied the distal RCA. He had been on medical therapy for the past 3 years and had done fairly well. However he is again began to notice  increasing chest pain as well as shortness of breath. A 2D echo Doppler study has demonstrated progressive aortic valve stenosis now felt to be approaching severe AS. He presents for repeat right and left heart cardiac catheterization.  Patient arrived to the catheterization laboratory in the fasting state. He was premedicated with Versed 1 mg and fentanyl 25 mcg. His right femoral artery was punctured anteriorly and a 5 French sheath was inserted without  difficulty. His right femoral vein was punctured anteriorly and a 7 French sheath was inserted. Swan-Ganz catheter was inserted and pressures were obtained in the RA, RV, PA, and wedge positions. O2 saturation in PA was performed. Thermodilution cardiac outputs were obtained. Oxygen saturation was obtained in the central aorta via the JL 4 initial diagnostic catheter. Ultimately a JL 5 catheter was used for selective angiography in the left coronary system. A 5 [place] JR4 catheter was used for angiography in the vein grafts. The ostial RCA was calcified but not able to be injected but this had previously been demonstrated to be totally occluded. A LIMA catheter was used for selective angiography into the left internal mammary artery. An AL-1 catheter was used with a straight wire and the aortic valve was crossed. LV pressures were recorded. An LV to AO pullback was performed under hemodynamic monitoring. All catheters were removed from the patient. The patient tolerated the procedure well. A minx closure device was used for hemostasis to the arterial sheath. The venous sheath was removed and hemostasis was obtained by manual pressure. The patient left the catheterization laboratory chest pain-free with stable hemodynamics. Estimated blood loss <50 mL.   During this procedure medications were administered to achieve and maintain moderate conscious sedation while the patient's heart rate, blood pressure, and oxygen saturation were continuously monitored and I  was present face-to-face 100% of this time.  Medications  (Filter: Administrations occurring from 1408 to 1614 on 10/18/19)  (important) Continuous medications are totaled by the amount administered until 10/18/19 1614.  Heparin (Porcine) in NaCl 1000-0.9 UT/500ML-% SOLN (mL)  Total volume: 1,000 mL  Date/Time  Rate/Dose/Volume Action  10/18/19 1409  500 mL Given  1426  500 mL Given  midazolam (VERSED) injection (mg)  Total dose: 1 mg  Date/Time  Rate/Dose/Volume Action  10/18/19 1435  1 mg Given  fentaNYL (SUBLIMAZE) injection (mcg)  Total dose: 25 mcg  Date/Time  Rate/Dose/Volume Action  10/18/19 1435  25 mcg Given  lidocaine (PF) (XYLOCAINE) 1 % injection (mL)  Total volume: 30 mL  Date/Time  Rate/Dose/Volume Action  10/18/19 1446  30 mL Given  iohexol (OMNIPAQUE) 350 MG/ML injection (mL)  Total volume: 95 mL  Date/Time  Rate/Dose/Volume Action  10/18/19 1544  95 mL Given  Sedation Time  Sedation Time Physician-1: 1 hour 7 minutes 47 seconds  Contrast  Medication Name Total Dose  iohexol (OMNIPAQUE) 350 MG/ML injection 95 mL  Radiation/Fluoro  Fluoro time: 18.5 (min)  DAP: 14782 (mGycm2)  Cumulative Air Kerma: 362 (mGy)  Coronary Findings  Diagnostic  Dominance: Right  Left Anterior Descending  Prox LAD to Mid LAD lesion is 100% stenosed with 100% stenosed side branch in 1st Diag.  Left Circumflex  Ost Cx to Prox Cx lesion is 100% stenosed.  Right Coronary Artery  Ost RCA to Dist RCA lesion is 100% stenosed.  Right Posterior Descending Artery  Collaterals  RPDA filled by collaterals from Dist Cx.    Graft To RPDA  Origin lesion is 100% stenosed.  Graft To 2nd Diag  Previously placed Origin to Prox Graft stent (unknown type) is widely patent.  Prox Graft to Mid Graft lesion is 40% stenosed.  Dist Graft lesion is 70% stenosed.  Graft To 2nd Mrg  LIMA Graft To Mid LAD  Intervention  No interventions have been documented.  Right Heart  Right Heart Pressures  RA: A-wave 6, V wave 7; mean 4 RV: 37/8 PA: 30/11; mean 24 PW: A-wave 15, V wave  21; mean 15  Initial AO 162/81 LV: 206/20  Pullback: LV: 207/21 AO: 164/84  O2 saturation in the pulmonary artery 73% and in the central aorta 99%.  Cardiac output by the thermodilution method was 6.5 L/min and by the Fick method 6.4 L/min. Cardiac index by the thermodilution method was 3.2 L/min/m and by the Fick method 3.1 L/min/m.  PVR: 1.42 WU  Aortic valve: Peak to peak gradient 43, mean gradient 41, valve area 1.0 cm  Coronary Diagrams  Diagnostic  Dominance: Right   Intervention  Implants     Vascular Products  Closure Mynx Control 31f - [id number] - Implanted   Inventory item: CLOSURE [facility] CONTROL 67F Model/Cat number: [id number]  Manufacturer: [employer] Lot number: [id number]  Device identifier: [id number] Device identifier type: GS1  GUDID Information   Request status Successful    Brand name: MYNX CONTROL Version/Model: [id number]  Company name: [employer] MRI safety info as of 10/18/19: MR Safe  Contains dry or latex rubber: No    GMDN P.T. name: Wound hydrogel dressing, non-antimicrobial    As of 10/18/2019    Status: Implanted     Syngo Images  Show images for CARDIAC CATHETERIZATION  Images on Long Term Storage  Show images for Boateng, Garo S Link to Procedure Log     Procedure Log   Hemo Data   Most Recent Value  Fick Cardiac Output 6.35 L/min  Fick Cardiac Output Index 3.13 (L/min)/BSA  Thermal Cardiac Output 6.49 L/min  Thermal Cardiac Output Index 3.2 (L/min)/BSA  Aortic Mean Gradient 40.5 mmHg  Aortic Peak Gradient 43 mmHg  Aortic Valve Area 1.00  Aortic Value Area Index 0.49 cm2/BSA  RA A Wave 6 mmHg  RA V Wave 7 mmHg  RA Mean 4 mmHg  RV Systolic Pressure 37 mmHg  RV Diastolic Pressure 1 mmHg  RV EDP 8 mmHg  PA Systolic Pressure 30 mmHg  PA Diastolic Pressure 11 mmHg  PA Mean 24 mmHg  PW A Wave 15 mmHg  PW V Wave 21 mmHg  PW Mean 15 mmHg    AO Systolic Pressure 162 mmHg  AO Diastolic Pressure 81 mmHg  AO Mean 114 mmHg  LV Systolic Pressure 205 mmHg  LV Diastolic Pressure 13 mmHg  LV EDP 20 mmHg  AOp Systolic Pressure 164 mmHg  AOp Diastolic Pressure 84 mmHg  AOp Mean Pressure 117 mmHg  LVp Systolic Pressure 207 mmHg  LVp Diastolic Pressure 13 mmHg  LVp EDP Pressure 21 mmHg  TPVR Index 7.5 HRUI  TSVR Index 35.62 HRUI  PVR SVR Ratio 0.08  TPVR/TSVR Ratio 0.21     ADDENDUM REPORT: 11/07/2019 07:39  CLINICAL DATA: 84 year old male with severe aortic stenosis being  evaluated for a TAVR procedure.  EXAM:  Cardiac TAVR CT  TECHNIQUE:  The patient was scanned on a [employer]. A 120 kV  retrospective scan was triggered in the descending thoracic aorta at  111 HU's. Gantry rotation speed was 250 msecs and collimation was .6  mm. No beta blockade or nitro were given. The 3D data set was  reconstructed in 5% intervals of the R-R cycle. Systolic and  diastolic phases were analyzed on a dedicated work station using  MPR, MIP and VRT modes. The patient received 80 cc of contrast.  FINDINGS:  Aortic Valve: Trileaflet aortic valve with severely calcified  leaflet with severe motion restriction and only minimal  calcifications extending into the LVOT.  Aorta: Normal size of the  thoracic aorta with moderate diffuse  atherosclerotic plaque and calcifications and no dissection.  Sinotubular Junction: 34 x 30 mm  Ascending Thoracic Aorta: 37 x 35 mm  Aortic Arch: 26 x 23 mm  Descending Thoracic Aorta: 28 x 27 mm  Sinus of Valsalva Measurements:  Non-coronary: 38 mm  Right -coronary: 36 mm  Left -coronary: 37 mm  Coronary Artery Height above Annulus:  Left Main: 15 mm  Right Coronary: 15 mm  Virtual Basal Annulus Measurements:  Maximum/Minimum Diameter: 29.3 x 23.4 mm  Mean Diameter: 25.6 mm  Perimeter: 83.1 mm  Area: 514 mm2  Optimum Fluoroscopic Angle for Delivery: LAO 2 CAU 1  IMPRESSION:  1.  Trileaflet aortic valve with severely calcified leaflet with  severe motion restriction and only minimal calcifications extending  into the LVOT. Annular measurements suitable for delivery of a 26 mm  Edwards-SAPIEN 3 Ultra valve. Aortic valve calcium score 3373  consistent with severe aortic stenosis.  2. Sufficient coronary to annulus distance.  3. Optimum Fluoroscopic Angle for Delivery: LAO 2 CAU 1  4. No thrombus in the left atrial appendage.  Electronically Signed  By: Tobias Alexander  On: 11/07/2019 07:39   Addended by Lars Masson, MD on 11/07/2019 7:41 AM  Study Result  Addenda  ADDENDUM REPORT: 11/07/2019 07:39  CLINICAL DATA: 84 year old male with severe aortic stenosis being  evaluated for a TAVR procedure.  EXAM:  Cardiac TAVR CT  TECHNIQUE:  The patient was scanned on a [employer]. A 120 kV  retrospective scan was triggered in the descending thoracic aorta at  111 HU's. Gantry rotation speed was 250 msecs and collimation was .6  mm. No beta blockade or nitro were given. The 3D data set was  reconstructed in 5% intervals of the R-R cycle. Systolic and  diastolic phases were analyzed on a dedicated work station using  MPR, MIP and VRT modes. The patient received 80 cc of contrast.  FINDINGS:  Aortic Valve: Trileaflet aortic valve with severely calcified  leaflet with severe motion restriction and only minimal  calcifications extending into the LVOT.  Aorta: Normal size of the thoracic aorta with moderate diffuse  atherosclerotic plaque and calcifications and no dissection.  Sinotubular Junction: 34 x 30 mm  Ascending Thoracic Aorta: 37 x 35 mm  Aortic Arch: 26 x 23 mm  Descending Thoracic Aorta: 28 x 27 mm  Sinus of Valsalva Measurements:  Non-coronary: 38 mm  Right -coronary: 36 mm  Left -coronary: 37 mm  Coronary Artery Height above Annulus:  Left Main: 15 mm  Right Coronary: 15 mm  Virtual Basal Annulus Measurements:  Maximum/Minimum  Diameter: 29.3 x 23.4 mm  Mean Diameter: 25.6 mm  Perimeter: 83.1 mm  Area: 514 mm2  Optimum Fluoroscopic Angle for Delivery: LAO 2 CAU 1  IMPRESSION:  1. Trileaflet aortic valve with severely calcified leaflet with  severe motion restriction and only minimal calcifications extending  into the LVOT. Annular measurements suitable for delivery of a 26 mm  Edwards-SAPIEN 3 Ultra valve. Aortic valve calcium score 3373  consistent with severe aortic stenosis.  2. Sufficient coronary to annulus distance.  3. Optimum Fluoroscopic Angle for Delivery: LAO 2 CAU 1  4. No thrombus in the left atrial appendage.  Electronically Signed  By: Tobias Alexander  On: 11/07/2019 07:39   Signed by Lars Masson, MD on 11/07/2019 7:41 AM  Narrative & Impression  EXAM:  OVER-READ INTERPRETATION CT CHEST  The following report is an over-read performed by  radiologist Dr.  Trudie [facility]iel [facility]kin [facility] on 11/06/2019. This  over-read does not include interpretation of cardiac or coronary  anatomy or pathology. The coronary calcium score/coronary CTA  interpretation by the cardiologist is attached.  COMPARISON: None.  FINDINGS:  Extracardiac findings will be described separately under dictation  for contemporaneously obtained CTA chest, abdomen and pelvis.  IMPRESSION:  Please see separate dictation for contemporaneously obtained CTA  chest, abdomen and pelvis dated 11/06/2019 for full description of  relevant extracardiac findings.  Electronically Signed:  By: Trudie Reedaniel Entrikin M.D.  On: 11/06/2019 12:42   CLINICAL DATA: 84 year old male with history of severe aortic  stenosis. Preprocedural study prior to potential transcatheter  aortic valve replacement (TAVR) procedure.  EXAM:  CT ANGIOGRAPHY CHEST, ABDOMEN AND PELVIS  TECHNIQUE:  Non-contrast CT of the chest was initially obtained.  Multidetector CT imaging through the chest, abdomen and pelvis was  performed using the standard  protocol during bolus administration of  intravenous contrast. Multiplanar reconstructed images and MIPs were  obtained and reviewed to evaluate the vascular anatomy.  CONTRAST: 100mL OMNIPAQUE IOHEXOL 350 MG/ML SOLN  COMPARISON: No priors.  FINDINGS:  CTA CHEST FINDINGS  Cardiovascular: Heart size is normal. There is no significant  pericardial fluid, thickening or pericardial calcification. There is  aortic atherosclerosis, as well as atherosclerosis of the great  vessels of the mediastinum and the coronary arteries, including  calcified atherosclerotic plaque in the left main, left anterior  descending, left circumflex and right coronary arteries. Status post  median sternotomy for CABG including LIMA to the LAD. Severe  thickening calcification of the aortic valve.  Mediastinum/Lymph Nodes: No pathologically enlarged mediastinal or  hilar lymph nodes. Esophagus is unremarkable in appearance. No  axillary lymphadenopathy.  Lungs/Pleura: No suspicious appearing pulmonary nodules or masses  are noted. No acute consolidative airspace disease. No pleural  effusions.  Musculoskeletal/Soft Tissues: Median sternotomy wires. There are no  aggressive appearing lytic or blastic lesions noted in the  visualized portions of the skeleton.  CTA ABDOMEN AND PELVIS FINDINGS  Hepatobiliary: No suspicious cystic or solid hepatic lesions. No  intra or extrahepatic biliary ductal dilatation. Gallbladder is  normal in appearance.  Pancreas: No pancreatic mass. No pancreatic ductal dilatation. No  pancreatic or peripancreatic fluid collections or inflammatory  changes.  Spleen: Unremarkable.  Adrenals/Urinary Tract: Multiple small nonobstructive calculi are  noted within the collecting systems of both kidneys measuring up to  4 mm in the lower pole collecting system of the right kidney.  Multiple small renal lesions are noted bilaterally, most of which  are too small to definitively characterize.  Several of the larger  lesions are intermediate to higher attenuation, and are concerning  for potential enhancing lesions, but are incompletely characterized  on today's study. The most concerning of these is in the interpolar  region of the left kidney (axial image 120 of series 16) measuring  2.2 x 1.9 cm (72 HU). No hydroureteronephrosis. Urinary bladder is  normal in appearance. Bilateral adrenal glands are normal in  appearance.  Stomach/Bowel: Normal appearance of the stomach. No pathologic  dilatation of small bowel or colon. Numerous colonic diverticulae  are noted, particularly in the sigmoid colon, without surrounding  inflammatory changes to suggest an acute diverticulitis at this  time. The appendix is not confidently identified and may be  surgically absent. Regardless, there are no inflammatory changes  noted adjacent to the cecum to suggest the presence of an acute  appendicitis at this time.  Vascular/Lymphatic: Aortic atherosclerosis, with vascular findings  and measurements pertinent to potential TAVR procedure, as detailed  below. 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  Reproductive: Prostate gland and seminal vesicles are unremarkable  in appearance.  Other: No significant volume of ascites. No pneumoperitoneum.  Musculoskeletal: Chronic appearing compression fractures of L1 and  L2 with approximately 30% loss of anterior vertebral body height at  both levels. 5 mm sclerotic lesion with narrow zone of transition in  the left sacral ala, likely to represent a bone island. There are no  other more aggressive appearing lytic or blastic lesions noted in  the visualized portions of the skeleton.  VASCULAR MEASUREMENTS PERTINENT TO TAVR:  AORTA:  Minimal Aortic Diameter-12 x 12 mm  Severity of Aortic Calcification-severe  RIGHT PELVIS:  Right Common Iliac Artery -  Minimal Diameter-10.2  x 10.4 mm  Tortuosity-moderate  Calcification-moderate  Right External Iliac Artery -  Minimal Diameter-8.6 x 8.1 mm  Tortuosity-severe  Calcification-mild  Comment: Short segment dissection of the distal right external iliac  artery.  Right Common Femoral Artery -  Minimal Diameter-8.2 x 7.7 mm  Tortuosity-mild  Calcification-mild-to-moderate  LEFT PELVIS:  Left Common Iliac Artery -  Minimal Diameter-11.3 x 6.8 mm  Tortuosity-moderate  Calcification-moderate  Comment: Short segment dissection of the proximal left common iliac  artery.  Left External Iliac Artery -  Minimal Diameter-8.7 x 9.0 mm  Tortuosity-severe  Calcification-mild  Left Common Femoral Artery -  Minimal Diameter-8.9 x 7.4 mm  Tortuosity-mild  Calcification-mild  Review of the MIP images confirms the above findings.  IMPRESSION:  1. Vascular findings and measurements pertinent to potential TAVR  procedure, as detailed above. In particular, please take note of the  short segment dissections in the right external iliac and left  common iliac arteries.  2. Severe thickening calcification of the aortic valve, compatible  with the reported clinical history of severe aortic stenosis.  3. Aortic atherosclerosis, in addition to left main and 3 vessel  coronary artery disease. Status post median sternotomy for CABG  including LIMA to the LAD.  4. Multiple indeterminate lesions in the kidneys, most concerning of  which measures 2.2 x 1.9 cm in the interpolar region of the left  kidney. Further characterization with nonemergent abdominal MRI with  and without IV gadolinium is strongly recommended in the near future  to exclude the possibility of underlying renal neoplasm.  5. Nonobstructive calculi in the collecting systems of both kidneys  measuring up to 4 mm in the lower pole collecting system of the  right kidney.  6. Colonic diverticulosis without evidence of acute diverticulitis  at this time.  7.  Additional incidental findings, as above.  Electronically Signed  By: Trudie Reed M.D.  On: 11/06/2019 13:36    STS Risk Score:   Risk of Mortality:  2.498%  Renal Failure:  3.466%  Permanent Stroke:  1.626%  Prolonged Ventilation:  9.722%  DSW Infection:  0.172%  Reoperation:  4.597%  Morbidity or Mortality:  15.465%  Short Length of Stay:  20.908%  Long Length of Stay:  9.223%    Impression:   This 84 year old gentleman has stage D, severe, symptomatic aortic stenosis with New York Heart Association class II symptoms of progressive exertional fatigue and shortness of breath as well as lower extremity edema and exertional substernal chest discomfort consistent with chronic diastolic congestive heart failure. I have personally reviewed his 2D echocardiogram, cardiac catheterization,  and CTA studies. His echocardiogram shows a trileaflet aortic valve with calcified leaflets with restricted mobility. The mean gradient was measured at 35 mmHg. Peak gradient was 67.1 mmHg with a valve area of 1.05 cm. Left ventricular ejection fraction was 60 to 65%. Cardiac catheterization showed total occlusion of the native coronary arteries proximally with a patent left internal mammary graft to LAD and patent saphenous vein graft to the second marginal and diagonal branch. The saphenous vein graft to the right coronary artery is occluded. The mean gradient across the aortic valve was measured at 41 mmHg. There is mild elevation of right heart pressures. I agree that aortic valve replacement is indicated in this patient to prevent progressive left ventricular deterioration and improve his progressive heart failure symptoms. Since he is 83 years old and has had prior heart surgery I think transcatheter aortic valve replacement be the best option for him. He has 3 patent vein grafts and no need for revascularization. His gated cardiac CTA shows anatomy suitable for transcatheter aortic valve  replacement using a SAPIEN 3 valve. His abdominal and pelvic CTA shows some localized dissection in the left common iliac artery but I think there is adequate lumen to pass a diagnostic catheter. The right iliofemoral system appears large enough for valve insertion. There is a question of a localized dissection in the right external iliac artery but I think that is probably just a bend in the artery. He does have significant calcification of the abdominal aorta but the lumen is large enough.   The abdominal CT also showed multiple indeterminate lesions in the kidneys with the most concerning being a 2.2 x 1.9 cm area in the interpolar region of the left kidney. Most of these look like renal cysts but the one in the interpolar region of the left kidney is a little different than the others and an abdominal MRI in the future was recommended to rule out renal neoplasm. I reviewed the images with the patient and his wife and plans for follow-up. All of their questions were answered.  The patient and his wife were counseled at length regarding treatment alternatives for management of severe symptomatic aortic stenosis. The risks and benefits of surgical intervention has been discussed in detail. Long-term prognosis with medical therapy was discussed. Alternative approaches such as conventional surgical aortic valve replacement, transcatheter aortic valve replacement, and palliative medical therapy were compared and contrasted at length. This discussion was placed in the context of the patient's own specific clinical presentation and past medical history. All of their questions have been addressed.   Following the decision to proceed with transcatheter aortic valve replacement, a discussion was held regarding what types of management strategies would be attempted intraoperatively in the event of life-threatening complications, including whether or not the patient would be considered a candidate for the use of  cardiopulmonary bypass and/or conversion to open sternotomy for attempted surgical intervention. The patient is aware of the fact that transient use of cardiopulmonary bypass may be necessary. Given his advanced age and the fact that he is a redo patient I do not think he would be a candidate for emergent sternotomy to manage any intraoperative complications. The patient has been advised of a variety of complications that might develop including but not limited to risks of death, stroke, paravalvular leak, aortic dissection or other major vascular complications, aortic annulus rupture, device embolization, cardiac rupture or perforation, mitral regurgitation, acute myocardial infarction, arrhythmia, heart block or bradycardia requiring permanent pacemaker placement, congestive  heart failure, respiratory failure, renal failure, pneumonia, infection, other late complications related to structural valve deterioration or migration, or other complications that might ultimately cause a temporary or permanent loss of functional independence or other long term morbidity. He had a previous ECG in 2019 bifascicular block and since then ECGs have shown nonspecific intraventricular block as well as an LVH pattern with wide complexes suggesting left bundle branch block. I think this would put him at increased risk for postoperative complete heart block and therefore we will place a Tempo wire in the right neck for pacing at the beginning of the procedure. I discussed this with the patient and his wife and they are in agreement. The patient provides full informed consent for the procedure as described and all questions were answered.    Plan:   Transfemoral transcatheter aortic valve placement.   Alleen Borne, MD

## 2019-11-14 NOTE — Progress Notes (Signed)
Patient ID: Jorge Cherry, male   DOB: February 20, 1936, 84 y.o.   MRN: [id number] TCTS Evening Rounds:  Hemodynamically stable  Atrial fib 60's  Temp pacer on VVI 30.  Awake and alert, neuro intact. Groin sites ok

## 2019-11-14 NOTE — Progress Notes (Signed)
Echocardiogram 2D Echocardiogram has been performed.  Warren Lacy Jonty Morrical 11/14/2019, 10:34 AM

## 2019-11-14 NOTE — Interval H&P Note (Signed)
History and Physical Interval Note:  11/14/2019 6:30 AM  Jorge Cherry  has presented today for surgery, with the diagnosis of Severe Aortic Stenosis.  The various methods of treatment have been discussed with the patient and family. After consideration of risks, benefits and other options for treatment, the patient has consented to  Procedure(s): TRANSCATHETER AORTIC VALVE REPLACEMENT, TRANSFEMORAL (N/A) TRANSESOPHAGEAL ECHOCARDIOGRAM (TEE) (N/A) as a surgical intervention.  The patient's history has been reviewed, patient examined, no change in status, stable for surgery.  I have reviewed the patient's chart and labs.  Questions were answered to the patient's satisfaction.     Alleen Borne

## 2019-11-14 NOTE — Progress Notes (Addendum)
  [facility]   MULTIDISCIPLINARY HEART VALVE TEAM  Patient doing well s/p TAVR. He is hemodynamically stable. Groin sites stable. ECG with afib with SVR vs junctional brady. There is no high grade block, but has intermittently required pacing. Tempo wire in place in IJ. Turned down to 30 to see underlying rhythm. Currently HR in 50s but occasionally dipping into 40s.  Cline Crock PA-C  [facility]  Pager [phone]   ADDENDUM: called by Arlys John, RN who said BP is becoming more elevated. Will resume home imdur and Norvasc.    Cline Crock PA-C  [facility]

## 2019-11-14 NOTE — Anesthesia Postprocedure Evaluation (Signed)
Anesthesia Post Note  Patient: Jorge Cherry  Procedure(s) Performed: TRANSCATHETER AORTIC VALVE REPLACEMENT, TRANSFEMORAL (N/A Chest) TRANSESOPHAGEAL ECHOCARDIOGRAM (TEE) (N/A )     Patient location during evaluation: PACU Anesthesia Type: MAC Level of consciousness: awake and alert Pain management: pain level controlled Vital Signs Assessment: post-procedure vital signs reviewed and stable Respiratory status: spontaneous breathing, nonlabored ventilation, respiratory function stable and patient connected to nasal cannula oxygen Cardiovascular status: stable and blood pressure returned to baseline Postop Assessment: no apparent nausea or vomiting Anesthetic complications: no   No complications documented.  Last Vitals:  Vitals:   11/14/19 1345 11/14/19 1400  BP: (!) 169/89 (!) 148/78  Pulse:  (!) 55  Resp:  14  Temp:    SpO2:  99%    Last Pain:  Vitals:   11/14/19 1200  TempSrc:   PainSc: 0-No pain                 Tiajuana Amass

## 2019-11-14 NOTE — Discharge Instructions (Signed)
ACTIVITY AND EXERCISE °• Daily activity and exercise are an important part of your recovery. People recover at different rates depending on their general health and type of valve procedure. °• Most people recovering from TAVR feel better relatively quickly  °• No lifting, pushing, pulling more than 10 pounds (examples to avoid: groceries, vacuuming, gardening, golfing): °            - For one week with a procedure through the groin. °            - For six weeks for procedures through the chest wall or neck. °NOTE: You will typically see one of our providers 7-14 days after your procedure to discuss WHEN TO RESUME the above activities.  °  °  °DRIVING °• Do not drive until you are seen for follow up and cleared by a provider. Generally, we ask patient to not drive for 1 week after their procedure. °• If you have been told by your doctor in the past that you may not drive, you must talk with him/her before you begin driving again. °  °DRESSING °• Groin site: you may leave the clear dressing over the site for up to one week or until it falls off. °  °HYGIENE °• If you had a femoral (leg) procedure, you may take a shower when you return home. After the shower, pat the site dry. Do NOT use powder, oils or lotions in your groin area until the site has completely healed. °• If you had a chest procedure, you may shower when you return home unless specifically instructed not to by your discharging practitioner. °            - DO NOT scrub incision; pat dry with a towel. °            - DO NOT apply any lotions, oils, powders to the incision. °            - No tub baths / swimming for at least 2 weeks. °• If you notice any fevers, chills, increased pain, swelling, bleeding or pus, please contact your doctor. °  °ADDITIONAL INFORMATION °• If you are going to have an upcoming dental procedure, please contact our office as you will require antibiotics ahead of time to prevent infection on your heart valve.  ° ° °If you have any  questions or concerns you can call the structural heart phone during normal business hours 8am-4pm. If you have an urgent need after hours or weekends please call [phone] to talk to the on call provider for general cardiology. If you have an emergency that requires immediate attention, please call 911.  ° ° °After TAVR Checklist ° °Check  Test Description  ° Follow up appointment in 1-2 weeks  You will see our structural heart physician assistant, Katie Rhodesia Stanger. Your incision sites will be checked and you will be cleared to drive and resume all normal activities if you are doing well.    ° 1 month echo and follow up  You will have an echo to check on your new heart valve and be seen back in the office by Katie Dennise Raabe. Many times the echo is not read by your appointment time, but Katie will call you later that day or the following day to report your results.  ° Follow up with your primary cardiologist You will need to be seen by your primary cardiologist in the following 3-6 months after your 1 month appointment in the valve   clinic. Often times your Plavix or Aspirin will be discontinued during this time, but this is decided on a case by case basis.   ° 1 year echo and follow up You will have another echo to check on your heart valve after 1 year and be seen back in the office by Katie Loveta Dellis. This your last structural heart visit.  ° Bacterial endocarditis prophylaxis  You will have to take antibiotics for the rest of your life before all dental procedures (even teeth cleanings) to protect your heart valve. Antibiotics are also required before some surgeries. Please check with your cardiologist before scheduling any surgeries. Also, please make sure to tell us if you have a penicillin allergy as you will require an alternative antibiotic.   ° ° °

## 2019-11-14 NOTE — Op Note (Signed)
[facility]   MULTIDISCIPLINARY HEART VALVE TEAM   TAVR OPERATIVE NOTE   Date of Procedure:  11/14/2019  Preoperative Diagnosis: Severe Aortic Stenosis   Postoperative Diagnosis: Same   Procedure:    Transcatheter Aortic Valve Replacement - Percutaneous Right Transfemoral Approach  Edwards Sapien 3 Ultra THV (size 26 mm, model # [id number], serial # [id number])   Co-Surgeons:  Alleen Borne, MD and Verne Carrow, MD   Anesthesiologist:  R. Sampson Goon, MD  Echocardiographer:  Eloy End, MD  Pre-operative Echo Findings:  Severe aortic stenosis  Mild left ventricular systolic dysfunction  Post-operative Echo Findings:  No paravalvular leak  Mild left ventricular systolic dysfunction   BRIEF CLINICAL NOTE AND INDICATIONS FOR SURGERY  This 84 year old gentleman has stage D, severe, symptomatic aortic stenosis with New York Heart Association class II symptoms of progressive exertional fatigue and shortness of breath as well as lower extremity edema and exertional substernal chest discomfort consistent with chronic diastolic congestive heart failure. I have personally reviewed his 2D echocardiogram, cardiac catheterization, and CTA studies. His echocardiogram shows a trileaflet aortic valve with calcified leaflets with restricted mobility. The mean gradient was measured at 35 mmHg. Peak gradient was 67.1 mmHg with a valve area of 1.05 cm. Left ventricular ejection fraction was 60 to 65%. Cardiac catheterization showed total occlusion of the native coronary arteries proximally with a patent left internal mammary graft to LAD and patent saphenous vein graft to the second marginal and diagonal branch. The saphenous vein graft to the right coronary artery is occluded. The mean gradient across the aortic valve was measured at 41 mmHg. There is mild elevation of right heart pressures. I agree that aortic valve replacement is indicated in this patient to prevent  progressive left ventricular deterioration and improve his progressive heart failure symptoms. Since he is 84 years old and has had prior heart surgery I think transcatheter aortic valve replacement be the best option for him. He has 3 patent vein grafts and no need for revascularization. His gated cardiac CTA shows anatomy suitable for transcatheter aortic valve replacement using a SAPIEN 3 valve. His abdominal and pelvic CTA shows some localized dissection in the left common iliac artery but I think there is adequate lumen to pass a diagnostic catheter. The right iliofemoral system appears large enough for valve insertion. There is a question of a localized dissection in the right external iliac artery but I think that is probably just a bend in the artery. He does have significant calcification of the abdominal aorta but the lumen is large enough.   The abdominal CT also showed multiple indeterminate lesions in the kidneys with the most concerning being a 2.2 x 1.9 cm area in the interpolar region of the left kidney. Most of these look like renal cysts but the one in the interpolar region of the left kidney is a little different than the others and an abdominal MRI in the future was recommended to rule out renal neoplasm. I reviewed the images with the patient and his wife and plans for follow-up. All of their questions were answered.  The patient and his wife were counseled at length regarding treatment alternatives for management of severe symptomatic aortic stenosis. The risks and benefits of surgical intervention has been discussed in detail. Long-term prognosis with medical therapy was discussed. Alternative approaches such as conventional surgical aortic valve replacement, transcatheter aortic valve replacement, and palliative medical therapy were compared and contrasted at length. This discussion was placed in the  context of the patient's own specific clinical presentation and past medical history. All  of their questions have been addressed.   Following the decision to proceed with transcatheter aortic valve replacement, a discussion was held regarding what types of management strategies would be attempted intraoperatively in the event of life-threatening complications, including whether or not the patient would be considered a candidate for the use of cardiopulmonary bypass and/or conversion to open sternotomy for attempted surgical intervention. The patient is aware of the fact that transient use of cardiopulmonary bypass may be necessary. Given his advanced age and the fact that he is a redo patient I do not think he would be a candidate for emergent sternotomy to manage any intraoperative complications. The patient has been advised of a variety of complications that might develop including but not limited to risks of death, stroke, paravalvular leak, aortic dissection or other major vascular complications, aortic annulus rupture, device embolization, cardiac rupture or perforation, mitral regurgitation, acute myocardial infarction, arrhythmia, heart block or bradycardia requiring permanent pacemaker placement, congestive heart failure, respiratory failure, renal failure, pneumonia, infection, other late complications related to structural valve deterioration or migration, or other complications that might ultimately cause a temporary or permanent loss of functional independence or other long term morbidity. He had a previous ECG in 2019 bifascicular block and since then ECGs have shown nonspecific intraventricular block as well as an LVH pattern with wide complexes suggesting left bundle branch block. I think this would put him at increased risk for postoperative complete heart block and therefore we will place a Tempo wire in the right neck for pacing at the beginning of the procedure. I discussed this with the patient and his wife and they are in agreement. The patient provides full informed consent for  the procedure as described and all questions were answered.     DETAILS OF THE OPERATIVE PROCEDURE  PREPARATION:    The patient was brought to the operating room on the above mentioned date and appropriate monitoring was established by the anesthesia team. The patient was placed in the supine position on the operating table.  Intravenous antibiotics were administered. The patient was monitored closely throughout the procedure under conscious sedation.    Baseline transthoracic echocardiogram was performed. The patient's abdomen and both groins were prepped and draped in a sterile manner. A time out procedure was performed.   Temporary Pacemaker:  The right neck was prepped and draped sterilely. Using ultrasound guidance a 7-F sheath was inserted into the right IJ vein. A Tempo pacing wire was inserted and advanced into the RV. Pacing threshold was acceptable.  PERIPHERAL ACCESS:    Using the modified Seldinger technique, femoral arterial access was obtained with placement of 6 Fr sheath on the left side.  A pigtail diagnostic catheter was passed through the left arterial sheath under fluoroscopic guidance into the aortic root. Aortic root angiography was performed in order to determine the optimal angiographic angle for valve deployment.   TRANSFEMORAL ACCESS:   Percutaneous transfemoral access and sheath placement was performed using ultrasound guidance.  The right common femoral artery was cannulated using a micropuncture needle and appropriate location was verified using hand injection angiogram.  A pair of Abbott Perclose percutaneous closure devices were placed and a 6 French sheath replaced into the femoral artery.  The patient was heparinized systemically and ACT verified > 250 seconds.    A 14 Fr transfemoral E-sheath was introduced into the right common femoral artery after progressively dilating over an  Amplatz superstiff wire. An AL-1 catheter was used to direct a straight-tip  exchange length wire across the native aortic valve into the left ventricle. This was exchanged out for a pigtail catheter and position was confirmed in the LV apex. Simultaneous LV and Ao pressures were recorded.  The pigtail catheter was exchanged for an Amplatz Extra-stiff wire in the LV apex.     BALLOON AORTIC VALVULOPLASTY:   Not performed   TRANSCATHETER HEART VALVE DEPLOYMENT:   An Edwards Sapien 3 Ultra transcatheter heart valve (size 26 mm, model #[id number], serial #[id number]) was prepared and crimped per manufacturer's guidelines, and the proper orientation of the valve is confirmed on the [employer] delivery system. The valve was advanced through the introducer sheath using normal technique until in an appropriate position in the abdominal aorta beyond the sheath tip. The balloon was then retracted and using the fine-tuning wheel was centered on the valve. The valve was then advanced across the aortic arch using appropriate flexion of the catheter. The valve was carefully positioned across the aortic valve annulus. The Commander catheter was retracted using normal technique. Once final position of the valve has been confirmed by angiographic assessment, the valve is deployed while temporarily holding ventilation and during rapid ventricular pacing to maintain systolic blood pressure < 50 mmHg and pulse pressure < 10 mmHg. The balloon inflation is held for >3 seconds after reaching full deployment volume. Once the balloon has fully deflated the balloon is retracted into the ascending aorta and valve function is assessed using echocardiography. There is felt to be no paravalvular leak and no central aortic insufficiency.  The patient's hemodynamic recovery following valve deployment is good.  The deployment balloon and guidewire are both removed.    PROCEDURE COMPLETION:   The sheath was removed and femoral artery closure performed.  Protamine was administered once femoral arterial repair  was complete. The temporary pacemaker, pigtail catheters and femoral sheaths were removed with manual pressure used for hemostasis.  A Mynx femoral closure device was utilized following removal of the diagnostic sheath in the left femoral artery.  The patient tolerated the procedure well and is transported to the cath lab recovery area in stable condition. There were no immediate intraoperative complications. All sponge instrument and needle counts are verified correct at completion of the operation.   No blood products were administered during the operation.  The patient received a total of 80 mL of intravenous contrast during the procedure.   Alleen Borne, MD 11/14/2019 11:01 AM

## 2019-11-14 NOTE — Anesthesia Procedure Notes (Signed)
Procedure Name: MAC Date/Time: 11/14/2019 7:33 AM Performed by: Griffin Dakin, CRNA Pre-anesthesia Checklist: Patient identified, Emergency Drugs available, Suction available and Patient being monitored Patient Re-evaluated:Patient Re-evaluated prior to induction Oxygen Delivery Method: Simple face mask Induction Type: IV induction Number of attempts: 1 Placement Confirmation: positive ETCO2 and breath sounds checked- equal and bilateral Dental Injury: Teeth and Oropharynx as per pre-operative assessment

## 2019-11-14 NOTE — CV Procedure (Signed)
[facility]  TAVR OPERATIVE NOTE   Date of Procedure:  11/14/2019  Preoperative Diagnosis: Severe Aortic Stenosis   Postoperative Diagnosis: Same   Procedure:    Transcatheter Aortic Valve Replacement - Transfemoral Approach  Edwards Sapien 3 THV (size 26 mm, model # [id number], serial # [id number])   Co-Surgeons:  Lauree [name], MD and Gaye Pollack, MD   Anesthesiologist:  Ola Spurr  Echocardiographer:  Meda Coffee  Pre-operative Echo Findings:  Severe aortic stenosis  Mild left ventricular systolic dysfunction  Post-operative Echo Findings:  No paravalvular leak  Mild left ventricular systolic dysfunction  BRIEF CLINICAL NOTE AND INDICATIONS FOR SURGERY  84 yo male with history of CAD s/p CABG in 1994, PAF, HTN, HLD, GERD, CKD stage 3 and severe aortic stenosis who is here today for TAVR. He is known to have CAD and had CABG in 1994. Cardiac cath 10/18/19 with total occlusion of all native vessels and 3 patent grafts (LIMA to LAD, SVG to OM, SVG to Diagonal), The vein graft to the RCA is occluded. Dr. Claiborne Billings and Dr. Burt Knack decided that medical management of his CAD was appropriate. Echo 08/14/19 with LVEF=55-60%, mild MR. The aortic valve is thickened and calcified with limited leaflet mobility, mean gradient 35 mmHg, peak gradient 67 mmHg, AVA 1.05 cm2, dimensionless index 0.30. He has had recent chest pain and dizziness as well as progressive fatigue and DOE.   During the course of the patient's preoperative work up they have been evaluated comprehensively by a multidisciplinary team of specialists coordinated through the [facility] in the [facility].  They have been demonstrated to suffer from symptomatic severe aortic stenosis as noted above. The patient has been counseled extensively as to the relative risks and benefits of all options for the treatment of severe aortic stenosis including long term medical  therapy, conventional surgery for aortic valve replacement, and transcatheter aortic valve replacement.  The patient has been independently evaluated by Dr. Cyndia Bent with CT surgery and they are felt to be at high risk for conventional surgical aortic valve replacement. The surgeon indicated the patient would be a poor candidate for conventional surgery. Based upon review of all of the patient's preoperative diagnostic tests they are felt to be candidate for transcatheter aortic valve replacement using the transfemoral approach as an alternative to high risk conventional surgery.    Following the decision to proceed with transcatheter aortic valve replacement, a discussion has been held regarding what types of management strategies would be attempted intraoperatively in the event of life-threatening complications, including whether or not the patient would be considered a candidate for the use of cardiopulmonary bypass and/or conversion to open sternotomy for attempted surgical intervention.  The patient has been advised of a variety of complications that might develop peculiar to this approach including but not limited to risks of death, stroke, paravalvular leak, aortic dissection or other major vascular complications, aortic annulus rupture, device embolization, cardiac rupture or perforation, acute myocardial infarction, arrhythmia, heart block or bradycardia requiring permanent pacemaker placement, congestive heart failure, respiratory failure, renal failure, pneumonia, infection, other late complications related to structural valve deterioration or migration, or other complications that might ultimately cause a temporary or permanent loss of functional independence or other long term morbidity.  The patient provides full informed consent for the procedure as described and all questions were answered preoperatively.    DETAILS OF THE OPERATIVE PROCEDURE  PREPARATION:   The patient is brought to the  operating room on the above mentioned date and central monitoring was established by the anesthesia team including placement of a radial arterial line. The patient is placed in the supine position on the operating table.  Intravenous antibiotics are administered. Conscious sedation is used.   Baseline transthoracic echocardiogram was performed. The patient's chest, abdomen, both groins, and both lower extremities are prepared and draped in a sterile manner. A time out procedure is performed.   PERIPHERAL ACCESS:   The right neck was prepped and draped. U/s guidance to place a 7 French sheath in the right internal jugular vein. A Tempo pacing wire was then placed in the RV.   Using the modified Seldinger technique, femoral arterial access was obtained with placement of 6 Fr sheath on the left side using u/s guidance.  A pigtail diagnostic catheter was passed through the femoral arterial sheath under fluoroscopic guidance into the aortic root.  Aortic root angiography was performed in order to determine the optimal angiographic angle for valve deployment.  TRANSFEMORAL ACCESS:  A micropuncture kit was used to gain access to the right femoral artery. Position confirmed with angiography. Pre-closure with double ProGlide closure devices. The patient was heparinized systemically and ACT verified > 250 seconds.    A 14 Fr transfemoral E-sheath was introduced into the right femoral artery after progressively dilating over an Amplatz superstiff wire. An AL-1 catheter was used to direct a straight-tip exchange length wire across the native aortic valve into the left ventricle. This was exchanged out for a pigtail catheter and position was confirmed in the LV apex. Simultaneous LV and Ao pressures were recorded.  The pigtail catheter was then exchanged for an Amplatz Extra-stiff wire in the LV apex.   TRANSCATHETER HEART VALVE DEPLOYMENT:  An Edwards Sapien 3 THV (size 26 mm) was prepared and crimped per  manufacturer's guidelines, and the proper orientation of the valve is confirmed on the [employer] delivery system. The valve was advanced through the introducer sheath using normal technique until in an appropriate position in the abdominal aorta beyond the sheath tip. The balloon was then retracted and using the fine-tuning wheel was centered on the valve. The valve was then advanced across the aortic arch using appropriate flexion of the catheter. The valve was carefully positioned across the aortic valve annulus. The Commander catheter was retracted using normal technique. Once final position of the valve has been confirmed by angiographic assessment, the valve is deployed while temporarily holding ventilation and during rapid ventricular pacing to maintain systolic blood pressure < 50 mmHg and pulse pressure < 10 mmHg. The balloon inflation is held for >3 seconds after reaching full deployment volume. Once the balloon has fully deflated the balloon is retracted into the ascending aorta and valve function is assessed using TTE. There is felt to be no paravalvular leak and no central aortic insufficiency.  The patient's hemodynamic recovery following valve deployment is good.  The deployment balloon and guidewire are both removed. Echo demostrated acceptable post-procedural gradients, stable mitral valve function, and no AI.   PROCEDURE COMPLETION:  The sheath was then removed and closure devices were completed. Protamine was administered once femoral arterial repair was complete. The pigtail catheter and left femoral sheath were removed with a Mynx closure device placed in the artery. The pacing wire was left in place in the right IJ.    The patient tolerated the procedure well and is transported to the surgical intensive care in stable condition. There were no immediate intraoperative complications.  All sponge instrument and needle counts are verified correct at completion of the operation.   No  blood products were administered during the operation.  The patient received a total of  80 mL of intravenous contrast during the procedure.  Lauree [name] MD 11/14/2019 10:54 AM

## 2019-11-14 NOTE — Anesthesia Procedure Notes (Addendum)
Arterial Line Insertion Start/End7/10/2019 6:30 AM, 11/14/2019 6:15 AM Performed by: Marcene Duos, MD, Macie Burows, CRNA, CRNA  Patient location: Pre-op. Preanesthetic checklist: patient identified, IV checked, site marked, risks and benefits discussed, surgical consent, monitors and equipment checked, pre-op evaluation, timeout performed and anesthesia consent Lidocaine 1% used for infiltration Right, radial was placed Catheter size: 20 Fr Hand hygiene performed  and maximum sterile barriers used   Attempts: 1 Procedure performed without using ultrasound guided technique. Following insertion, dressing applied and Biopatch. Post procedure assessment: normal and unchanged  Patient tolerated the procedure well with no immediate complications.

## 2019-11-14 NOTE — Transfer of Care (Signed)
Immediate Anesthesia Transfer of Care Note  Patient: Jorge Cherry  Procedure(s) Performed: TRANSCATHETER AORTIC VALVE REPLACEMENT, TRANSFEMORAL (N/A Chest) TRANSESOPHAGEAL ECHOCARDIOGRAM (TEE) (N/A )  Patient Location: PACU and Cath Lab  Anesthesia Type:MAC  Level of Consciousness: awake, alert  and oriented  Airway & Oxygen Therapy: Patient Spontanous Breathing  Post-op Assessment: Report given to RN and Post -op Vital signs reviewed and stable  Post vital signs: Reviewed and stable  Last Vitals:  Vitals Value Taken Time  BP 129/59 11/14/19 1058  Temp    Pulse 55 11/14/19 1102  Resp 18 11/14/19 1102  SpO2 94 % 11/14/19 1102  Vitals shown include unvalidated device data.  Last Pain:  Vitals:   11/14/19 0612  TempSrc: Oral  PainSc: 0-No pain         Complications: No complications documented.

## 2019-11-14 NOTE — Progress Notes (Signed)
0.9NS infusing at 10cc/hr to rt IJ. Pacing wires taped to rt lower chest.

## 2019-11-15 ENCOUNTER — Inpatient Hospital Stay (INDEPENDENT_AMBULATORY_CARE_PROVIDER_SITE_OTHER): Payer: Medicare PPO

## 2019-11-15 ENCOUNTER — Telehealth: Payer: Self-pay | Admitting: Cardiovascular Disease

## 2019-11-15 ENCOUNTER — Other Ambulatory Visit: Payer: Self-pay | Admitting: Cardiovascular Disease

## 2019-11-15 ENCOUNTER — Inpatient Hospital Stay (HOSPITAL_COMMUNITY): Payer: Medicare PPO

## 2019-11-15 DIAGNOSIS — I350 Nonrheumatic aortic (valve) stenosis: Secondary | ICD-10-CM

## 2019-11-15 DIAGNOSIS — N289 Disorder of kidney and ureter, unspecified: Secondary | ICD-10-CM

## 2019-11-15 DIAGNOSIS — R001 Bradycardia, unspecified: Secondary | ICD-10-CM

## 2019-11-15 DIAGNOSIS — Z952 Presence of prosthetic heart valve: Secondary | ICD-10-CM

## 2019-11-15 DIAGNOSIS — I25708 Atherosclerosis of coronary artery bypass graft(s), unspecified, with other forms of angina pectoris: Secondary | ICD-10-CM

## 2019-11-15 DIAGNOSIS — Z954 Presence of other heart-valve replacement: Secondary | ICD-10-CM

## 2019-11-15 LAB — BASIC METABOLIC PANEL
Anion gap: 9 (ref 5–15)
BUN: 19 mg/dL (ref 8–23)
CO2: 20 mmol/L — ABNORMAL LOW (ref 22–32)
Calcium: 8.4 mg/dL — ABNORMAL LOW (ref 8.9–10.3)
Chloride: 112 mmol/L — ABNORMAL HIGH (ref 98–111)
Creatinine, Ser: 1.17 mg/dL (ref 0.61–1.24)
GFR calc Af Amer: >60 mL/min (ref >60)
GFR calc non Af Amer: 58 mL/min — ABNORMAL LOW (ref >60)
Glucose, Bld: 110 mg/dL — ABNORMAL HIGH (ref 70–99)
Potassium: 3.9 mmol/L (ref 3.5–5.1)
Sodium: 141 mmol/L (ref 135–145)

## 2019-11-15 LAB — MAGNESIUM: Magnesium: 1.8 mg/dL (ref 1.7–2.4)

## 2019-11-15 LAB — CBC
HCT: 32.6 % — ABNORMAL LOW (ref 39.0–52.0)
Hemoglobin: 10.3 g/dL — ABNORMAL LOW (ref 13.0–17.0)
MCH: 31 pg (ref 26.0–34.0)
MCHC: 31.6 g/dL (ref 30.0–36.0)
MCV: 98.2 fL (ref 80.0–100.0)
Platelets: 130 10*3/uL — ABNORMAL LOW (ref 150–400)
RBC: 3.32 MIL/uL — ABNORMAL LOW (ref 4.22–5.81)
RDW: 13.2 % (ref 11.5–15.5)
WBC: 12.9 10*3/uL — ABNORMAL HIGH (ref 4.0–10.5)
nRBC: 0 % (ref 0.0–0.2)

## 2019-11-15 LAB — ECHOCARDIOGRAM LIMITED
Height: 68 in
Weight: 3125.24 oz

## 2019-11-15 MED ORDER — SODIUM CHLORIDE 0.9% FLUSH: 10.0000 mL | INTRAVENOUS | Status: DC | PRN

## 2019-11-15 MED ORDER — ASPIRIN 81 MG PO CHEW: 81.0000 mg | CHEWABLE_TABLET | Freq: Every day | ORAL | Status: DC

## 2019-11-15 MED ORDER — ORAL CARE MOUTH RINSE
15.0000 mL | Freq: Two times a day (BID) | OROMUCOSAL | Status: DC
  Administered 2019-11-15: 15 mL via OROMUCOSAL

## 2019-11-15 NOTE — Plan of Care (Signed)
Pt in ICU with TVP for bradycardia. HR improved overnight. Pt remains A & O, denies pain, discomfort or shortness of breath. Aline discontinued per MD orders.

## 2019-11-15 NOTE — Plan of Care (Signed)

## 2019-11-15 NOTE — Discharge Summary (Signed)
[facility]   MULTIDISCIPLINARY HEART VALVE TEAM  Discharge Summary    Patient ID: Jorge Cherry MRN: [id number]; DOB: 1936-02-18  Admit date: 11/14/2019 Discharge date: 11/15/2019  Primary Care Provider: Chilton Greathouse, MD  Primary Cardiologist: Nicki Guadalajara, MD / Dr. Clifton James & Dr. Laneta Simmers (TAVR)  Discharge Diagnoses    Principal Problem:   S/P TAVR (transcatheter aortic valve replacement) Active Problems:   Hx of CABG   Severe aortic stenosis   Chronic renal insufficiency, stage III (moderate) (HCC)   Essential hypertension   Mild persistent asthma   Gastroesophageal reflux disease   Hyperlipidemia LDL goal <70   Paroxysmal atrial fibrillation (HCC)   Acute on chronic diastolic heart failure (HCC)   Coronary artery disease   Anemia   Renal lesion   Allergies Allergies  Allergen Reactions  . Altace [Ramipril] Swelling and Other (See Comments)    Mouth swelling  . Mucinex [Guaifenesin Er] Hives, Swelling and Other (See Comments)    Mouth swelling  . Contrast Media [Iodinated Diagnostic Agents] Other (See Comments)    Made eyes change each time    Diagnostic Studies/Procedures    TAVR OPERATIVE NOTE   Date of Procedure:                11/14/2019  Preoperative Diagnosis:      Severe Aortic Stenosis   Postoperative Diagnosis:    Same   Procedure:        Transcatheter Aortic Valve Replacement - Percutaneous Right Transfemoral Approach             Edwards Sapien 3 Ultra THV (size 26 mm, model # [id number], serial # [id number])              Co-Surgeons:                        Alleen Borne, MD and Verne Carrow, MD   Anesthesiologist:                  Glade Stanford, MD  Echocardiographer:              Eloy End, MD  Pre-operative Echo Findings: ? Severe aortic stenosis ? Mild left ventricular systolic dysfunction  Post-operative Echo Findings: ? No paravalvular leak ? Mild left ventricular systolic dysfunction _____________     Echo 11/15/2019: complete but pending formal read at the time of discharge    History of Present Illness     Jorge Cherry is a 84 y.o. male with a history of CAD s/p CABG x4V (1994), PAF on Eliquis, HTN, HLD, GERD, CKD stage IIIA, and severe AS who presented to [facility] on 11/14/19 for planned TAVR.   He is known to have CAD and had CABG in 1994. Cardiac cath 10/18/19 with total occlusion of all native vessels and 3 patent grafts (LIMA to LAD, SVG to OM, SVG to Diagonal), The vein graft to the RCA is occluded. Dr. Tresa Endo and Dr. Excell Seltzer decided that medical management of his CAD was appropriate. Echo 08/14/19 with LVEF=55-60%, mild MR. The aortic valve is thickened and calcified with limited leaflet mobility, mean gradient 35 mmHg, peak gradient 67 mmHg, AVA 1.05 cm2, dimensionless index 0.30. He has had recent chest pain and dizziness as well as progressive fatigue, dyspnea on exertion and LE edema.  The patient has been evaluated by the multidisciplinary valve team and felt to have severe, symptomatic aortic stenosis and to be a suitable candidate  for TAVR, which was set up for 11/14/19.   Hospital Course     Consultants: none  Severe AS: s/p successful TAVR with a 26 mm Edwards Sapien 3 Ultra THV via the TF approach on 11/14/19. Post operative echo completed but pending formal read. Groin sites are stable. ECG with afib with slow VR and LBBB. There has been no evidence of HAVB. Resumed on home Eliquis and started on a baby Asprin. Plan for DC home with a Zio patch with close follow up in the office next week.   Conduction disease: patient's pre operative ECG showed 1st deg AV block with previous RBBB and LAFB. Now in afib with IVCD in a LBBB pattern. He has had up to 2.5 second pauses. Will place a Zio patch at discharge.   PAF: he has converted into afib. He has been continued on amiodarone. Hopefully, he will convert back to NSR. Resumed on home Eliquis 2.5mg  BID. He really qualifies for 5mg  dosing based on  age, weight and renal function, but will continue on the same given addition of a baby aspirin.   Acute on chronic diastolic CHF: as evidenced by an elevated BNP on pre admission lab work and mild CHF on CXR. This has been treated with TAVR. Not on any diuretics  Renal lesion: pre TAVR CT showed multiple indeterminate lesions in the kidneys, most concerning of which measures 2.2 x 1.9 cm in the interpolar region of the left kidney. Further characterization with nonemergent abdominal MRI with and without IV gadolinium is strongly recommended in the near future to exclude the possibility of underlying renal neoplasm. This will be arranged in the outpatient setting.    _____________  Discharge Vitals Blood pressure 138/77, pulse (!) 54, temperature (!) 96.6 F (35.9 C), temperature source Axillary, resp. rate 16, height 5\' 8"  (1.727 m), weight 88.6 kg, SpO2 96 %.  Filed Weights   11/14/19 0612 11/15/19 0600  Weight: 88.7 kg 88.6 kg    Labs & Radiologic Studies    CBC Recent Labs    11/14/19 1126 11/15/19 0426  WBC  --  12.9*  HGB 10.9* 10.3*  HCT 32.0* 32.6*  MCV  --  98.2  PLT  --  130*   Basic Metabolic Panel Recent Labs    16/10/96 1126 11/15/19 0426  NA 143 141  K 4.2 3.9  CL 108 112*  CO2  --  20*  GLUCOSE 145* 110*  BUN 17 19  CREATININE 1.00 1.17  CALCIUM  --  8.4*  MG  --  1.8   Liver Function Tests No results for input(s): AST, ALT, ALKPHOS, BILITOT, PROT, ALBUMIN in the last 72 hours. No results for input(s): LIPASE, AMYLASE in the last 72 hours. Cardiac Enzymes No results for input(s): CKTOTAL, CKMB, CKMBINDEX, TROPONINI in the last 72 hours. BNP Invalid input(s): POCBNP D-Dimer No results for input(s): DDIMER in the last 72 hours. Hemoglobin A1C No results for input(s): HGBA1C in the last 72 hours. Fasting Lipid Panel No results for input(s): CHOL, HDL, LDLCALC, TRIG, CHOLHDL, LDLDIRECT in the last 72 hours. Thyroid Function Tests No results for  input(s): TSH, T4TOTAL, T3FREE, THYROIDAB in the last 72 hours.  Invalid input(s): FREET3 _____________  DG Chest 2 View  Result Date: 11/11/2019 CLINICAL DATA:  Preoperative radiograph, TAVR scheduled 11/14/2019 EXAM: CHEST - 2 VIEW COMPARISON:  CT chest 11/06/2019 FINDINGS: Few septal lines in the periphery of the lung bases with central vascular congestion could reflect some mild interstitial edema. No  consolidation, pneumothorax, or effusion. Postsurgical changes related to prior CABG including intact and aligned sternotomy wires and multiple surgical clips projecting over the mediastinum. The aorta is calcified. The remaining cardiomediastinal contours are unremarkable. No acute osseous or soft tissue abnormality. Stable compression deformities at L1 and L2. Degenerative changes are present in the imaged spine and shoulders. IMPRESSION: Mild interstitial edema. No other acute cardiopulmonary abnormality. Prior CABG. Unchanged L1, L2 compression deformities. Aortic Atherosclerosis (ICD10-I70.0). Electronically Signed   By: Kreg Shropshire M.D.   On: 11/11/2019 00:14   CARDIAC CATHETERIZATION  Result Date: 10/18/2019  Ost RCA to Dist RCA lesion is 100% stenosed.  Origin lesion is 100% stenosed.  Ost Cx to Prox Cx lesion is 100% stenosed.  Prox LAD to Mid LAD lesion is 100% stenosed with 100% stenosed side branch in 1st Diag.  Dist Graft lesion is 70% stenosed.  Prox Graft to Mid Graft lesion is 40% stenosed.  Previously placed Origin to Prox Graft stent (unknown type) is widely patent.  Severe native coronary restrictive disease with total occlusion of the proximal LAD prior to any diagonal vessel takeoff; total occlusion of the ostium of the left circumflex coronary artery and total occlusion of the ostium of the native RCA. Patent LIMA graft supplying the mid LAD. Patent large SVG supplying the OM 2 vessel with filling of the circumflex to the OM1 vessel with 70% mid AV groove stenosis and evidence  for collateralization to the distal RCA. Patent stent in the proximal SVG supplying the diagonal vessel with mid graft narrowing of 40% and focal 70% distal graft stenosis. Old occluded graft which had supplied the distal RCA. Severe aortic stenosis with a mean transvalvular aortic gradient at 41 mm.\ Mild right heart pressure elevation. RECOMMENDATION: I reviewed the angiographic findings with Dr. Excell Seltzer.  Will refer for TAVR consultation.  Medical therapy for his significant CAD.  Patient will resume Eliquis tomorrow.   CT CORONARY MORPH W/CTA COR W/SCORE W/CA W/CM &/OR WO/CM  Addendum Date: 11/07/2019   ADDENDUM REPORT: 11/07/2019 07:39 CLINICAL DATA:  84 year old male with severe aortic stenosis being evaluated for a TAVR procedure. EXAM: Cardiac TAVR CT TECHNIQUE: The patient was scanned on a [employer]. A 120 kV retrospective scan was triggered in the descending thoracic aorta at 111 HU's. Gantry rotation speed was 250 msecs and collimation was .6 mm. No beta blockade or nitro were given. The 3D data set was reconstructed in 5% intervals of the R-R cycle. Systolic and diastolic phases were analyzed on a dedicated work station using MPR, MIP and VRT modes. The patient received 80 cc of contrast. FINDINGS: Aortic Valve: Trileaflet aortic valve with severely calcified leaflet with severe motion restriction and only minimal calcifications extending into the LVOT. Aorta: Normal size of the thoracic aorta with moderate diffuse atherosclerotic plaque and calcifications and no dissection. Sinotubular Junction: 34 x 30 mm Ascending Thoracic Aorta: 37 x 35 mm Aortic Arch: 26 x 23 mm Descending Thoracic Aorta: 28 x 27 mm Sinus of Valsalva Measurements: Non-coronary: 38 mm Right -coronary: 36 mm Left -coronary: 37 mm Coronary Artery Height above Annulus: Left Main: 15 mm Right Coronary: 15 mm Virtual Basal Annulus Measurements: Maximum/Minimum Diameter: 29.3 x 23.4 mm Mean Diameter: 25.6 mm Perimeter: 83.1  mm Area: 514 mm2 Optimum Fluoroscopic Angle for Delivery: LAO 2 CAU 1 IMPRESSION: 1. Trileaflet aortic valve with severely calcified leaflet with severe motion restriction and only minimal calcifications extending into the LVOT. Annular measurements suitable for delivery of a  26 mm Edwards-SAPIEN 3 Ultra valve. Aortic valve calcium score 3373 consistent with severe aortic stenosis. 2. Sufficient coronary to annulus distance. 3. Optimum Fluoroscopic Angle for Delivery: LAO 2 CAU 1 4. No thrombus in the left atrial appendage. Electronically Signed   By: Tobias Alexander   On: 11/07/2019 07:39   Result Date: 11/07/2019 EXAM: OVER-READ INTERPRETATION  CT CHEST The following report is an over-read performed by radiologist Dr. Trudie Reed of [facility] on 11/06/2019. This over-read does not include interpretation of cardiac or coronary anatomy or pathology. The coronary calcium score/coronary CTA interpretation by the cardiologist is attached. COMPARISON:  None. FINDINGS: Extracardiac findings will be described separately under dictation for contemporaneously obtained CTA chest, abdomen and pelvis. IMPRESSION: Please see separate dictation for contemporaneously obtained CTA chest, abdomen and pelvis dated 11/06/2019 for full description of relevant extracardiac findings. Electronically Signed: By: Trudie Reed M.D. On: 11/06/2019 12:42   DG Chest Port 1 View  Result Date: 11/14/2019 CLINICAL DATA:  Status post aortic valve replacement EXAM: PORTABLE CHEST 1 VIEW COMPARISON:  November 10, 2019 FINDINGS: The lungs are clear. Heart is upper normal in size with pulmonary vascularity normal. There is a prosthetic aortic valve. Patient is status post coronary artery bypass grafting. Pacemaker lead is attached to the right ventricle. There is aortic atherosclerosis. No adenopathy. No pneumothorax. No bone lesions. IMPRESSION: Postoperative changes. Lungs clear. Heart upper normal in size. No pneumothorax.  Aortic Atherosclerosis (ICD10-I70.0). Electronically Signed   By: Bretta Bang III M.D.   On: 11/14/2019 12:21   CT ANGIO CHEST AORTA W/CM & OR WO/CM  Result Date: 11/06/2019 CLINICAL DATA:  84 year old male with history of severe aortic stenosis. Preprocedural study prior to potential transcatheter aortic valve replacement (TAVR) procedure. EXAM: CT ANGIOGRAPHY CHEST, ABDOMEN AND PELVIS TECHNIQUE: Non-contrast CT of the chest was initially obtained. Multidetector CT imaging through the chest, abdomen and pelvis was performed using the standard protocol during bolus administration of intravenous contrast. Multiplanar reconstructed images and MIPs were obtained and reviewed to evaluate the vascular anatomy. CONTRAST:  OMNIPAQUE IOHEXOL 350 MG/ML SOLN COMPARISON:  No priors. FINDINGS: CTA CHEST FINDINGS Cardiovascular: Heart size is normal. There is no significant pericardial fluid, thickening or pericardial calcification. There is aortic atherosclerosis, as well as atherosclerosis of the great vessels of the mediastinum and the coronary arteries, including calcified atherosclerotic plaque in the left main, left anterior descending, left circumflex and right coronary arteries. Status post median sternotomy for CABG including LIMA to the LAD. Severe thickening calcification of the aortic valve. Mediastinum/Lymph Nodes: No pathologically enlarged mediastinal or hilar lymph nodes. Esophagus is unremarkable in appearance. No axillary lymphadenopathy. Lungs/Pleura: No suspicious appearing pulmonary nodules or masses are noted. No acute consolidative airspace disease. No pleural effusions. Musculoskeletal/Soft Tissues: Median sternotomy wires. There are no aggressive appearing lytic or blastic lesions noted in the visualized portions of the skeleton. CTA ABDOMEN AND PELVIS FINDINGS Hepatobiliary: No suspicious cystic or solid hepatic lesions. No intra or extrahepatic biliary ductal dilatation. Gallbladder is  normal in appearance. Pancreas: No pancreatic mass. No pancreatic ductal dilatation. No pancreatic or peripancreatic fluid collections or inflammatory changes. Spleen: Unremarkable. Adrenals/Urinary Tract: Multiple small nonobstructive calculi are noted within the collecting systems of both kidneys measuring up to 4 mm in the lower pole collecting system of the right kidney. Multiple small renal lesions are noted bilaterally, most of which are too small to definitively characterize. Several of the larger lesions are intermediate to higher attenuation, and are concerning  for potential enhancing lesions, but are incompletely characterized on today's study. The most concerning of these is in the interpolar region of the left kidney (axial image 120 of series 16) measuring 2.2 x 1.9 cm (72 HU). No hydroureteronephrosis. Urinary bladder is normal in appearance. Bilateral adrenal glands are normal in appearance. Stomach/Bowel: Normal appearance of the stomach. No pathologic dilatation of small bowel or colon. Numerous colonic diverticulae are noted, particularly in the sigmoid colon, without surrounding inflammatory changes to suggest an acute diverticulitis at this time. The appendix is not confidently identified and may be surgically absent. Regardless, there are no inflammatory changes noted adjacent to the cecum to suggest the presence of an acute appendicitis at this time. Vascular/Lymphatic: Aortic atherosclerosis, with vascular findings and measurements pertinent to potential TAVR procedure, as detailed below. 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 Reproductive: Prostate gland and seminal vesicles are unremarkable in appearance. Other: No significant volume of ascites.  No pneumoperitoneum. Musculoskeletal: Chronic appearing compression fractures of L1 and L2 with approximately 30% loss of anterior vertebral body height at both levels.  5 mm sclerotic lesion with narrow zone of transition in the left sacral ala, likely to represent a bone island. There are no other more aggressive appearing lytic or blastic lesions noted in the visualized portions of the skeleton. 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 Right Common Femoral Artery - Minimal Diameter-8.2 x 7.7 mm Tortuosity-mild Calcification-mild-to-moderate LEFT PELVIS: Left Common Iliac Artery - Minimal Diameter-11.3 x 6.8 mm Tortuosity-moderate Calcification-moderate Comment: Short segment dissection of the proximal left common iliac artery. Left External Iliac Artery - Minimal Diameter-8.7 x 9.0 mm Tortuosity-severe Calcification-mild Left Common Femoral Artery - Minimal Diameter-8.9 x 7.4 mm Tortuosity-mild Calcification-mild Review of the MIP images confirms the above findings. IMPRESSION: 1. Vascular findings and measurements pertinent to potential TAVR procedure, as detailed above. In particular, please take note of the short segment dissections in the right external iliac and left common iliac arteries. 2. Severe thickening calcification of the aortic valve, compatible with the reported clinical history of severe aortic stenosis. 3. Aortic atherosclerosis, in addition to left main and 3 vessel coronary artery disease. Status post median sternotomy for CABG including LIMA to the LAD. 4. Multiple indeterminate lesions in the kidneys, most concerning of which measures 2.2 x 1.9 cm in the interpolar region of the left kidney. Further characterization with nonemergent abdominal MRI with and without IV gadolinium is strongly  recommended in the near future to exclude the possibility of underlying renal neoplasm. 5. Nonobstructive calculi in the collecting systems of both kidneys measuring up to 4 mm in the lower pole collecting system of the right kidney. 6. Colonic diverticulosis without evidence of acute diverticulitis at this time. 7. Additional incidental findings, as above. Electronically Signed   By: Trudie Reed M.D.   On: 11/06/2019 13:36   VAS US CAROTID  Result Date: 11/10/2019 Carotid Arterial Duplex Study Indications:  Severe aortic stenosis, pre TAVR. Risk Factors: Hypertension, coronary artery disease. Performing Technologist: Jean Rosenthal  Examination Guidelines: A complete evaluation includes B-mode imaging, spectral Doppler, color Doppler, and power Doppler as needed of all accessible portions of each vessel. Bilateral testing is considered  an integral part of a complete examination. Limited examinations for reoccurring indications may be performed as noted.  Right Carotid Findings: +----------+--------+--------+--------+---------------------+------------------+           PSV cm/sEDV cm/sStenosisPlaque Description   Comments           +----------+--------+--------+--------+---------------------+------------------+ CCA Prox  63      12                                   intimal thickening +----------+--------+--------+--------+---------------------+------------------+ CCA Distal64      14              heterogenous         intimal thickening +----------+--------+--------+--------+---------------------+------------------+ ICA Prox  125     39      1-39%   irregular and                                                             calcific                                +----------+--------+--------+--------+---------------------+------------------+ ICA Mid   93      28                                                       +----------+--------+--------+--------+---------------------+------------------+ ICA Distal64      26                                                      +----------+--------+--------+--------+---------------------+------------------+ ECA       77      11                                                      +----------+--------+--------+--------+---------------------+------------------+ +----------+--------+-------+----------------+-------------------+           PSV cm/sEDV cmsDescribe        Arm Pressure (mmHG) +----------+--------+-------+----------------+-------------------+ [id number]             Multiphasic, WNL                    +----------+--------+-------+----------------+-------------------+ +---------+--------+--+--------+-+---------+ VertebralPSV cm/s30EDV cm/s9Antegrade +---------+--------+--+--------+-+---------+  Left Carotid Findings: +----------+--------+--------+--------+---------------------+------------------+           PSV cm/sEDV cm/sStenosisPlaque Description   Comments           +----------+--------+--------+--------+---------------------+------------------+ CCA Prox  70      17                                   intimal thickening +----------+--------+--------+--------+---------------------+------------------+ CCA Distal59      15  heterogenous         intimal thickening +----------+--------+--------+--------+---------------------+------------------+ ICA Prox  123     40      40-59%  irregular and                                                             calcific                                +----------+--------+--------+--------+---------------------+------------------+ ICA Distal89      20                                                      +----------+--------+--------+--------+---------------------+------------------+ ECA       107     17                                                       +----------+--------+--------+--------+---------------------+------------------+ +----------+--------+--------+----------------+-------------------+           PSV cm/sEDV cm/sDescribe        Arm Pressure (mmHG) +----------+--------+--------+----------------+-------------------+ [id number]              Multiphasic, WNL                    +----------+--------+--------+----------------+-------------------+ +---------+--------+--+--------+--+---------+ VertebralPSV cm/s59EDV cm/s16Antegrade +---------+--------+--+--------+--+---------+   Summary: Right Carotid: Velocities in the right ICA are consistent with a 1-39% stenosis. Left Carotid: Velocities in the left ICA are consistent with a 40-59% stenosis. Vertebrals:  Bilateral vertebral arteries demonstrate antegrade flow. Subclavians: Normal flow hemodynamics were seen in bilateral subclavian              arteries. *See table(s) above for measurements and observations.  Electronically signed by Sherald Hess MD on 11/10/2019 at 2:42:03 PM.    Final    ECHOCARDIOGRAM LIMITED  Result Date: 11/14/2019    ECHOCARDIOGRAM LIMITED REPORT   Patient Name:   Jorge Cherry Date of Exam: 11/14/2019 Medical Rec #:  [id number]    Height:       68.0 in Accession #:    [id number]   Weight:       195.6 lb Date of Birth:  December 31, 1935    BSA:          2.025 m Patient Age:    83 years     BP:           163/73 mmHg Patient Gender: M            HR:           66 bpm. Exam Location:  Inpatient Procedure: Limited Echo, Color Doppler and Cardiac Doppler Indications:     Aortic Stenosis i35.0  History:         Patient has prior history of Echocardiogram examinations, most                  recent 08/31/2019. CHF, Prior CABG, Arrythmias:Atrial  Fibrillation; Risk Factors:Hypertension, Dyslipidemia and Sleep                  Apnea.  Sonographer:     Irving BurtonEmily Senior RDCS Referring Phys:  [id number] Kathleene HazelHRISTOPHER D MCALHANY Diagnosing Phys: Tobias AlexanderKatarina Nelson MD  Sonographer  Comments: TAVR Procedure for implantation of 26mm Edwards Ultra Sapien Bioprosthetic IMPRESSIONS  1. Periprocedural limited TTE during a TAVR procedure. A 26 mm Edwards-SAPIEN 3 Ultra valve was successfully deployed in the aortic position with improvement of transaortic gradients from peak/mean 59/40 mmHg to 7/3 mmHg. No paravalvular leak was seen. No pericardial effusion prior or post procedure. LVEF has slightly improved post procedure from 45-50% to 50-55%.  2. Left ventricular ejection fraction, by estimation, is 50 to 55%. The left ventricle has low normal function. There is mild concentric left ventricular hypertrophy. Left ventricular diastolic function could not be evaluated.  3. Right ventricular systolic function is normal. The right ventricular size is normal.  4. Left atrial size was mildly dilated.  5. The mitral valve is degenerative. Mild mitral valve regurgitation. No evidence of mitral stenosis.  6. The aortic valve is tricuspid. Aortic valve regurgitation is not visualized. Severe aortic valve stenosis. Aortic valve mean gradient measures 40.0 mmHg. FINDINGS  Left Ventricle: Left ventricular ejection fraction, by estimation, is 50 to 55%. The left ventricle has low normal function. There is mild concentric left ventricular hypertrophy. Right Ventricle: The right ventricular size is normal. No increase in right ventricular wall thickness. Right ventricular systolic function is normal. Left Atrium: Left atrial size was mildly dilated. Right Atrium: Right atrial size was normal in size. Pericardium: There is no evidence of pericardial effusion. Mitral Valve: The mitral valve is degenerative in appearance. There is moderate thickening of the mitral valve leaflet(s). There is moderate calcification of the mitral valve leaflet(s). Mild mitral valve regurgitation. No evidence of mitral valve stenosis. Tricuspid Valve: The tricuspid valve is normal in structure. Tricuspid valve regurgitation is trivial.  Aortic Valve: The aortic valve is tricuspid. . There is severe thickening and severe calcifcation of the aortic valve. Aortic valve regurgitation is not visualized. Severe aortic stenosis is present. There is severe thickening of the aortic valve. There is severe calcifcation of the aortic valve. Aortic valve mean gradient measures 40.0 mmHg. Aortic valve peak gradient measures 7.0 mmHg. Aortic valve area, by VTI measures 2.48 cm. Aorta: The aortic root is normal in size and structure.  LEFT VENTRICLE PLAX 2D LVOT diam:     2.00 cm LV SV:         68 LV SV Index:   33 LVOT Area:     3.14 cm  AORTIC VALVE AV Area (Vmax):    2.25 cm AV Area (Vmean):   2.46 cm AV Area (VTI):     2.48 cm AV Vmax:           132.00 cm/s AV Vmean:          84.000 cm/s AV VTI:            0.272 m AV Peak Grad:      7.0 mmHg AV Mean Grad:      40.0 mmHg LVOT Vmax:         94.60 cm/s LVOT Vmean:        65.700 cm/s LVOT VTI:          0.215 m LVOT/AV VTI ratio: 0.79  SHUNTS Systemic VTI:  0.22 m Systemic Diam: 2.00 cm Tobias AlexanderKatarina Nelson MD Electronically signed  by Tobias Alexander MD Signature Date/Time: 11/14/2019/11:04:54 AM    Final    Structural Heart Procedure  Result Date: 11/14/2019 See surgical note for result.  Structural Heart Procedure  Result Date: 11/14/2019 See surgical note for result.  CT Angio Abd/Pel w/ and/or w/o  Result Date: 11/06/2019 CLINICAL DATA:  84 year old male with history of severe aortic stenosis. Preprocedural study prior to potential transcatheter aortic valve replacement (TAVR) procedure. EXAM: CT ANGIOGRAPHY CHEST, ABDOMEN AND PELVIS TECHNIQUE: Non-contrast CT of the chest was initially obtained. Multidetector CT imaging through the chest, abdomen and pelvis was performed using the standard protocol during bolus administration of intravenous contrast. Multiplanar reconstructed images and MIPs were obtained and reviewed to evaluate the vascular anatomy. CONTRAST:  OMNIPAQUE IOHEXOL 350 MG/ML SOLN  COMPARISON:  No priors. FINDINGS: CTA CHEST FINDINGS Cardiovascular: Heart size is normal. There is no significant pericardial fluid, thickening or pericardial calcification. There is aortic atherosclerosis, as well as atherosclerosis of the great vessels of the mediastinum and the coronary arteries, including calcified atherosclerotic plaque in the left main, left anterior descending, left circumflex and right coronary arteries. Status post median sternotomy for CABG including LIMA to the LAD. Severe thickening calcification of the aortic valve. Mediastinum/Lymph Nodes: No pathologically enlarged mediastinal or hilar lymph nodes. Esophagus is unremarkable in appearance. No axillary lymphadenopathy. Lungs/Pleura: No suspicious appearing pulmonary nodules or masses are noted. No acute consolidative airspace disease. No pleural effusions. Musculoskeletal/Soft Tissues: Median sternotomy wires. There are no aggressive appearing lytic or blastic lesions noted in the visualized portions of the skeleton. CTA ABDOMEN AND PELVIS FINDINGS Hepatobiliary: No suspicious cystic or solid hepatic lesions. No intra or extrahepatic biliary ductal dilatation. Gallbladder is normal in appearance. Pancreas: No pancreatic mass. No pancreatic ductal dilatation. No pancreatic or peripancreatic fluid collections or inflammatory changes. Spleen: Unremarkable. Adrenals/Urinary Tract: Multiple small nonobstructive calculi are noted within the collecting systems of both kidneys measuring up to 4 mm in the lower pole collecting system of the right kidney. Multiple small renal lesions are noted bilaterally, most of which are too small to definitively characterize. Several of the larger lesions are intermediate to higher attenuation, and are concerning for potential enhancing lesions, but are incompletely characterized on today's study. The most concerning of these is in the interpolar region of the left kidney (axial image 120 of series 16)  measuring 2.2 x 1.9 cm (72 HU). No hydroureteronephrosis. Urinary bladder is normal in appearance. Bilateral adrenal glands are normal in appearance. Stomach/Bowel: Normal appearance of the stomach. No pathologic dilatation of small bowel or colon. Numerous colonic diverticulae are noted, particularly in the sigmoid colon, without surrounding inflammatory changes to suggest an acute diverticulitis at this time. The appendix is not confidently identified and may be surgically absent. Regardless, there are no inflammatory changes noted adjacent to the cecum to suggest the presence of an acute appendicitis at this time. Vascular/Lymphatic: Aortic atherosclerosis, with vascular findings and measurements pertinent to potential TAVR procedure, as detailed below. 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 Reproductive: Prostate gland and seminal vesicles are unremarkable in appearance. Other: No significant volume of ascites.  No pneumoperitoneum. Musculoskeletal: Chronic appearing compression fractures of L1 and L2 with approximately 30% loss of anterior vertebral body height at both levels. 5 mm sclerotic lesion with narrow zone of transition in the left sacral ala, likely to represent a bone island. There are no other more aggressive appearing lytic or blastic lesions noted in the  visualized portions of the skeleton. 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 Right Common Femoral Artery - Minimal Diameter-8.2 x 7.7 mm Tortuosity-mild Calcification-mild-to-moderate LEFT PELVIS:  Left Common Iliac Artery - Minimal Diameter-11.3 x 6.8 mm Tortuosity-moderate Calcification-moderate Comment: Short segment dissection of the proximal left common iliac artery. Left External Iliac Artery - Minimal Diameter-8.7 x 9.0 mm Tortuosity-severe Calcification-mild Left Common Femoral Artery - Minimal Diameter-8.9 x 7.4 mm Tortuosity-mild Calcification-mild Review of the MIP images confirms the above findings. IMPRESSION: 1. Vascular findings and measurements pertinent to potential TAVR procedure, as detailed above. In particular, please take note of the short segment dissections in the right external iliac and left common iliac arteries. 2. Severe thickening calcification of the aortic valve, compatible with the reported clinical history of severe aortic stenosis. 3. Aortic atherosclerosis, in addition to left main and 3 vessel coronary artery disease. Status post median sternotomy for CABG including LIMA to the LAD. 4. Multiple indeterminate lesions in the kidneys, most concerning of which measures 2.2 x 1.9 cm in the interpolar region of the left kidney. Further characterization with nonemergent abdominal MRI with and without IV gadolinium is strongly recommended in the near future to exclude the possibility of underlying renal neoplasm. 5. Nonobstructive calculi in the collecting systems of both kidneys measuring up to 4 mm in the lower pole collecting system of the right kidney. 6. Colonic diverticulosis without evidence of acute diverticulitis at this time. 7. Additional incidental findings, as above. Electronically Signed   By: Trudie Reed M.D.   On: 11/06/2019 13:36   Disposition   Pt is being discharged home today in good condition.  Follow-up Plans & Appointments     Follow-up Information    Janetta Hora, PA-C. Go on 11/22/2019.   Specialties: Cardiology, Radiology Why: @ 1:30pm, please arrive at least 10 minutes early. Contact information: [address] [place] [address] [phone]                Discharge Medications   Allergies as of 11/15/2019      Reactions   Altace [ramipril] Swelling, Other (See Comments)   Mouth swelling   Mucinex [guaifenesin Er] Hives, Swelling, Other (See Comments)   Mouth swelling   Contrast Media [iodinated Diagnostic Agents] Other (See Comments)   Made eyes change each time      Medication List    STOP taking these medications   predniSONE 50 MG tablet Commonly known as: DELTASONE     TAKE these medications   albuterol 108 (90 Base) MCG/ACT inhaler Commonly known as: VENTOLIN HFA INHALE TWO PUFFS EVERY 4-6 HOURS AS NEEDED FOR COUGH OR WHEEZE What changed: See the new instructions.   amiodarone 200 MG tablet Commonly known as: PACERONE Take 1 tablet (200 mg total) by mouth daily.   amLODipine 2.5 MG tablet Commonly known as: NORVASC Take 1 tablet (2.5 mg total) by mouth 2 (two) times daily. NEED OV.   aspirin 81 MG chewable tablet Chew 1 tablet (81 mg total) by mouth daily. Start taking on: November 16, 2019   atorvastatin 20 MG tablet Commonly known as: LIPITOR Take 1 tablet (20 mg total) by mouth at bedtime.   Centrum Silver 50+Men Tabs  Take 1 tablet by mouth at bedtime.   docusate sodium 100 MG capsule Commonly known as: COLACE Take 100 mg by mouth daily.   Dymista 137-50 MCG/ACT Susp Generic drug: Azelastine-Fluticasone Place 1-2 sprays into the nose daily as needed (for seasonal allergies).   Eliquis 2.5 MG Tabs tablet Generic drug: apixaban TAKE 1 TABLET BY MOUTH TWICE A DAY   ezetimibe 10 MG tablet Commonly known as: ZETIA TAKE 1 TABLET BY MOUTH EVERY DAY What changed:   how much to take  how to take this  when to take this   fenofibrate 145 MG tablet Commonly known as: TRICOR Take 0.5 tablets (72.5 mg total) by mouth daily.   Fish Oil 1000 MG Caps Take 1,000 mg by mouth 2 (two) times daily.   fluticasone 110 MCG/ACT inhaler Commonly  known as: FLOVENT HFA Inhale 1 puff into the lungs 3 (three) times daily as needed (for seasonal flares).   fluticasone 50 MCG/ACT nasal spray Commonly known as: FLONASE Place 2 sprays into both nostrils daily.   ipratropium 0.06 % nasal spray Commonly known as: ATROVENT Can use two sprays in each nostril every six hours as needed to dry up nose. What changed:   how much to take  how to take this  when to take this  additional instructions   isosorbide mononitrate 60 MG 24 hr tablet Commonly known as: IMDUR TAKE 1 AND 1/2 TABS (90MG ) BY MOUTH EVERY MORNING AND 1/2 TAB EVERY EVENING What changed:   how much to take  how to take this  when to take this   levothyroxine 25 MCG tablet Commonly known as: SYNTHROID Take 25 mcg by mouth daily before breakfast.   loratadine 10 MG tablet Commonly known as: CLARITIN Take 10 mg by mouth every evening.   nitroGLYCERIN 0.4 MG/SPRAY spray Commonly known as: NITROLINGUAL Place 1 spray under the tongue every 5 (five) minutes x 3 doses as needed for chest pain.   pantoprazole 40 MG tablet Commonly known as: PROTONIX TAKE 1 TABLET BY MOUTH EVERY DAY   ranolazine 500 MG 12 hr tablet Commonly known as: RANEXA TAKE 1 TABLET BY MOUTH TWICE A DAY   vitamin C 500 MG tablet Commonly known as: ASCORBIC ACID Take 500 mg by mouth at bedtime.   vitamin E 180 MG (400 UNITS) capsule Take 400 Units by mouth daily.   zinc gluconate 50 MG tablet Take 50 mg by mouth 2 (two) times a week.         Outstanding Labs/Studies   none  Duration of Discharge Encounter   Greater than 30 minutes including physician time.  SignedCline Crock, PA-C 11/15/2019, 10:42 AM [phone]

## 2019-11-15 NOTE — Progress Notes (Signed)
CARDIAC REHAB PHASE I   PRE:  Rate/Rhythm: 66 Afib  BP:  Sitting: 127/83      SaO2: 97 RA  MODE:  Ambulation: 370 ft   POST:  Rate/Rhythm: 80 Afib  BP:  Sitting: 142/109    SaO2: 99 RA  Pt ambulated 335ft in hallway handheld assist with steady gait. Pt denies pain, SOB, or dizziness. Pt educated on site care and importance of monitoring incision daily. Encouraged continued ambulation with emphasis on safety. Will refer to CRP II [place].  [phone] Reynold Bowen, RN BSN 11/15/2019 11:50 AM

## 2019-11-15 NOTE — Progress Notes (Signed)
  Echocardiogram 2D Echocardiogram has been performed.  [name] 11/15/2019, 9:52 AM

## 2019-11-15 NOTE — Telephone Encounter (Signed)
New Message   Jorge Cherry is calling with monitor results   Call was disconnected   Please call

## 2019-11-15 NOTE — Progress Notes (Signed)
1 Day Post-Op Procedure(s) (LRB): TRANSCATHETER AORTIC VALVE REPLACEMENT, TRANSFEMORAL (N/A) TRANSESOPHAGEAL ECHOCARDIOGRAM (TEE) (N/A) Subjective: No complaints. Feels well.  Rhythm has been atrial fib postop with rate 60's to 70's. No pacing.  Objective: Vital signs in last 24 hours: Temp:  [96.6 F (35.9 C)-98.4 F (36.9 C)] 96.6 F (35.9 C) (07/07 0713) Pulse Rate:  [0-138] 56 (07/07 0700) Cardiac Rhythm: Atrial fibrillation (07/07 0700) Resp:  [13-20] 17 (07/07 0700) BP: (109-169)/(48-92) 143/92 (07/07 0700) SpO2:  [89 %-100 %] 97 % (07/07 0700) Arterial Line BP: (69-163)/(51-69) 138/57 (07/07 0600) Weight:  [88.6 kg] 88.6 kg (07/07 0600)  Hemodynamic parameters for last 24 hours:    Intake/Output from previous day: 07/06 0701 - 07/07 0700 In: 2552.6 [I.V.:2155.8; IV Piggyback:396.7] Out: 2025 [Urine:2025] Intake/Output this shift: No intake/output data recorded.  General appearance: alert and cooperative Neurologic: intact Heart: irregularly irregular rhythm and no murmur Lungs: clear to auscultation bilaterally Extremities: extremities normal, atraumatic, no cyanosis or edema Wound: groin sites look good.  Lab Results: Recent Labs    11/14/19 1126 11/15/19 0426  WBC  --  12.9*  HGB 10.9* 10.3*  HCT 32.0* 32.6*  PLT  --  130*   BMET:  Recent Labs    11/14/19 1126 11/15/19 0426  NA 143 141  K 4.2 3.9  CL 108 112*  CO2  --  20*  GLUCOSE 145* 110*  BUN 17 19  CREATININE 1.00 1.17  CALCIUM  --  8.4*    PT/INR: No results for input(s): LABPROT, INR in the last 72 hours. ABG    Component Value Date/Time   PHART 7.400 11/14/2019 0804   HCO3 20.0 11/14/2019 0804   TCO2 22 11/14/2019 1126   ACIDBASEDEF 4.0 (H) 11/14/2019 0804   O2SAT 99.0 11/14/2019 0804   CBG (last 3)  No results for input(s): GLUCAP in the last 72 hours.   ECG: atrial fib 51 with non-specific intraventricular block.  Assessment/Plan: S/P Procedure(s) (LRB): TRANSCATHETER  AORTIC VALVE REPLACEMENT, TRANSFEMORAL (N/A) TRANSESOPHAGEAL ECHOCARDIOGRAM (TEE) (N/A)  POD 1 Hemodynamically stable. Rhythm is atrial fib with rate 60's to 70's. Non-specific IV block which was present preop. He was in sinus preop.  DC temp pacer and sheath. Plan to resume Eliquis and start ASA 81 mg at discharge. 2D echo this am. Ambulate and plan home later today if no problems.   LOS: 1 day    Alleen Borne 11/15/2019

## 2019-11-15 NOTE — Progress Notes (Signed)
Zio patch placed onto patient.  All instructions and information reviewed with patient, they verbalize understanding with no questions. 

## 2019-11-15 NOTE — Telephone Encounter (Signed)
Patient is currently in the hospital. Spoke with the patient's wife. Patient has been in atrial fibrillation. The wife also states that they had the patient push the button on the monitor a little bit ago to test it out so it was not an actual patient triggered event.

## 2019-11-15 NOTE — Telephone Encounter (Signed)
Janae from [employer] calling to report that patient had 1st documented atrial fibrillation today at 11:14am. Heart rate went from 55-68 bpm.

## 2019-11-16 ENCOUNTER — Telehealth: Payer: Self-pay | Admitting: Physician Assistant

## 2019-11-16 DIAGNOSIS — Z954 Presence of other heart-valve replacement: Secondary | ICD-10-CM | POA: Diagnosis not present

## 2019-11-16 NOTE — Telephone Encounter (Signed)
  [facility]   MULTIDISCIPLINARY HEART VALVE TEAM   Patient contacted regarding discharge from [facility] on 7/7  Patient understands to follow up with provider Katie Glyn Gerads on 7/14 at [address]  Patient understands discharge instructions? yes Patient understands medications and regimen? yes Patient understands to bring all medications to this visit? yes  Jonell Brumbaugh PA-C  [facility]      

## 2019-11-19 ENCOUNTER — Encounter: Payer: Self-pay | Admitting: *Deleted

## 2019-11-19 ENCOUNTER — Emergency Department
Admission: EM | Admit: 2019-11-19 | Discharge: 2019-11-20 | Payer: Medicare PPO | Attending: Emergency Medicine | Admitting: Emergency Medicine

## 2019-11-19 ENCOUNTER — Inpatient Hospital Stay: Admission: AD | Admit: 2019-11-19 | Payer: Medicare PPO | Source: Other Acute Inpatient Hospital | Admitting: Cardiology

## 2019-11-19 ENCOUNTER — Telehealth: Payer: Self-pay | Admitting: Internal Medicine

## 2019-11-19 ENCOUNTER — Emergency Department: Payer: Medicare PPO

## 2019-11-19 DIAGNOSIS — I959 Hypotension, unspecified: Secondary | ICD-10-CM | POA: Diagnosis not present

## 2019-11-19 DIAGNOSIS — R55 Syncope and collapse: Secondary | ICD-10-CM | POA: Diagnosis not present

## 2019-11-19 DIAGNOSIS — I5033 Acute on chronic diastolic (congestive) heart failure: Secondary | ICD-10-CM | POA: Diagnosis not present

## 2019-11-19 DIAGNOSIS — I455 Other specified heart block: Secondary | ICD-10-CM | POA: Diagnosis not present

## 2019-11-19 DIAGNOSIS — Z7951 Long term (current) use of inhaled steroids: Secondary | ICD-10-CM | POA: Diagnosis not present

## 2019-11-19 DIAGNOSIS — I130 Hypertensive heart and chronic kidney disease with heart failure and stage 1 through stage 4 chronic kidney disease, or unspecified chronic kidney disease: Secondary | ICD-10-CM | POA: Diagnosis not present

## 2019-11-19 DIAGNOSIS — I2510 Atherosclerotic heart disease of native coronary artery without angina pectoris: Secondary | ICD-10-CM | POA: Diagnosis not present

## 2019-11-19 DIAGNOSIS — R231 Pallor: Secondary | ICD-10-CM | POA: Diagnosis not present

## 2019-11-19 DIAGNOSIS — Z79899 Other long term (current) drug therapy: Secondary | ICD-10-CM | POA: Insufficient documentation

## 2019-11-19 DIAGNOSIS — S0990XA Unspecified injury of head, initial encounter: Secondary | ICD-10-CM | POA: Diagnosis not present

## 2019-11-19 DIAGNOSIS — I495 Sick sinus syndrome: Secondary | ICD-10-CM | POA: Insufficient documentation

## 2019-11-19 DIAGNOSIS — Z951 Presence of aortocoronary bypass graft: Secondary | ICD-10-CM | POA: Insufficient documentation

## 2019-11-19 DIAGNOSIS — I480 Paroxysmal atrial fibrillation: Secondary | ICD-10-CM | POA: Insufficient documentation

## 2019-11-19 DIAGNOSIS — I10 Essential (primary) hypertension: Secondary | ICD-10-CM | POA: Diagnosis not present

## 2019-11-19 DIAGNOSIS — Z7982 Long term (current) use of aspirin: Secondary | ICD-10-CM | POA: Insufficient documentation

## 2019-11-19 DIAGNOSIS — I4891 Unspecified atrial fibrillation: Secondary | ICD-10-CM | POA: Diagnosis not present

## 2019-11-19 DIAGNOSIS — G9389 Other specified disorders of brain: Secondary | ICD-10-CM | POA: Diagnosis not present

## 2019-11-19 DIAGNOSIS — N183 Chronic kidney disease, stage 3 unspecified: Secondary | ICD-10-CM | POA: Insufficient documentation

## 2019-11-19 LAB — CBC
HCT: 32.7 % — ABNORMAL LOW (ref 39.0–52.0)
Hemoglobin: 10.7 g/dL — ABNORMAL LOW (ref 13.0–17.0)
MCH: 31.2 pg (ref 26.0–34.0)
MCHC: 32.7 g/dL (ref 30.0–36.0)
MCV: 95.3 fL (ref 80.0–100.0)
Platelets: 101 10*3/uL — ABNORMAL LOW (ref 150–400)
RBC: 3.43 MIL/uL — ABNORMAL LOW (ref 4.22–5.81)
RDW: 13.2 % (ref 11.5–15.5)
WBC: 11.4 10*3/uL — ABNORMAL HIGH (ref 4.0–10.5)
nRBC: 0 % (ref 0.0–0.2)

## 2019-11-19 LAB — BASIC METABOLIC PANEL
Anion gap: 9 (ref 5–15)
BUN: 22 mg/dL (ref 8–23)
CO2: 21 mmol/L — ABNORMAL LOW (ref 22–32)
Calcium: 8.6 mg/dL — ABNORMAL LOW (ref 8.9–10.3)
Chloride: 109 mmol/L (ref 98–111)
Creatinine, Ser: 1.39 mg/dL — ABNORMAL HIGH (ref 0.61–1.24)
GFR calc Af Amer: 55 mL/min — ABNORMAL LOW (ref >60)
GFR calc non Af Amer: 47 mL/min — ABNORMAL LOW (ref >60)
Glucose, Bld: 117 mg/dL — ABNORMAL HIGH (ref 70–99)
Potassium: 3.9 mmol/L (ref 3.5–5.1)
Sodium: 139 mmol/L (ref 135–145)

## 2019-11-19 LAB — TROPONIN I (HIGH SENSITIVITY): Troponin I (High Sensitivity): 39 ng/L — ABNORMAL HIGH (ref <18)

## 2019-11-19 IMAGING — CT CT HEAD W/O CM
4 series · 16 of 47 positions shown, 18 images · non-contrast
Comparison: Head CT dated [DATE].

CLINICAL DATA: 83-year-old male with head trauma.

EXAM:
CT HEAD WITHOUT CONTRAST
TECHNIQUE: Contiguous axial images were obtained from the base of the skull
through the vertex without intravenous contrast.

[Series 2: head bone · axial · 0.42mm/px · z∈[+105,+137]mm · 3 of 78 slices shown]
[im 8/78  bone]
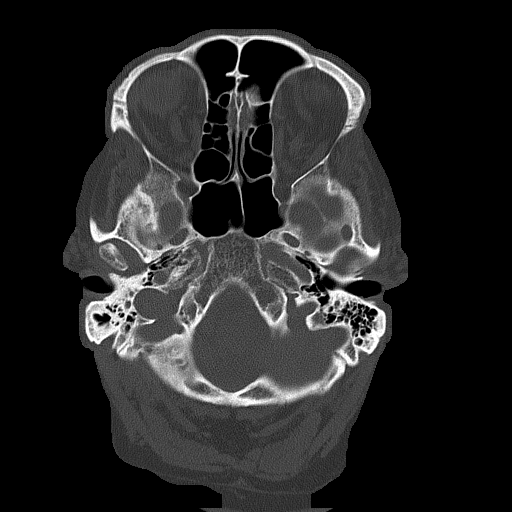
[im 16/78  bone]
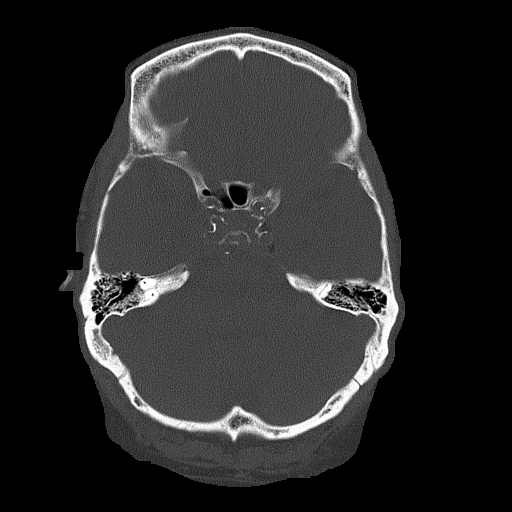
[im 24/78  bone]
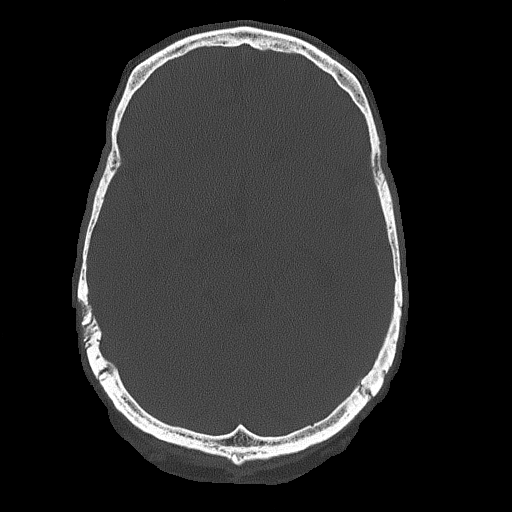

[Series 3: head wo · axial · 0.42mm/px · z∈[+106,+226]mm · 7 of 32 slices shown, 9 images]
[im 4/32  brain]
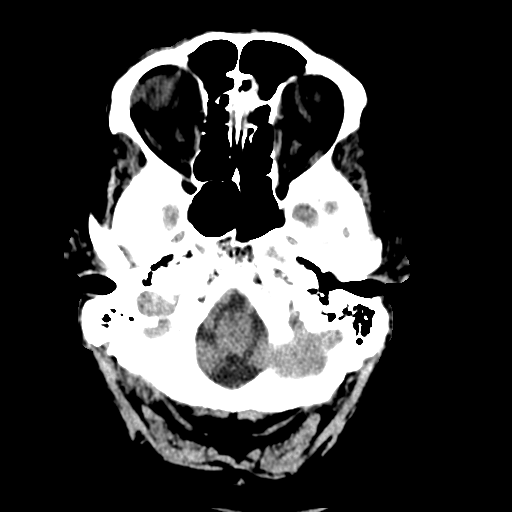
[im 4/32  bone]
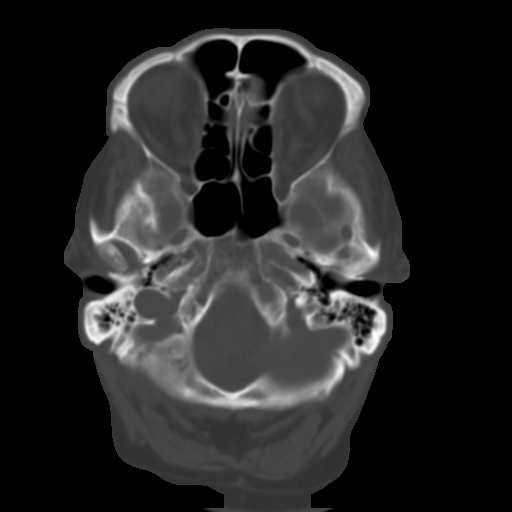
[im 8/32  brain]
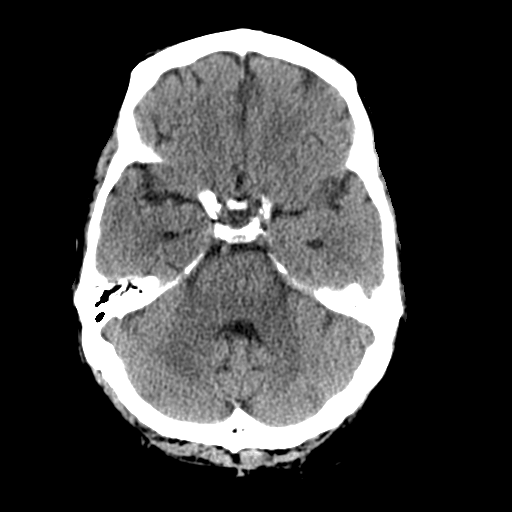
[im 12/32  brain]
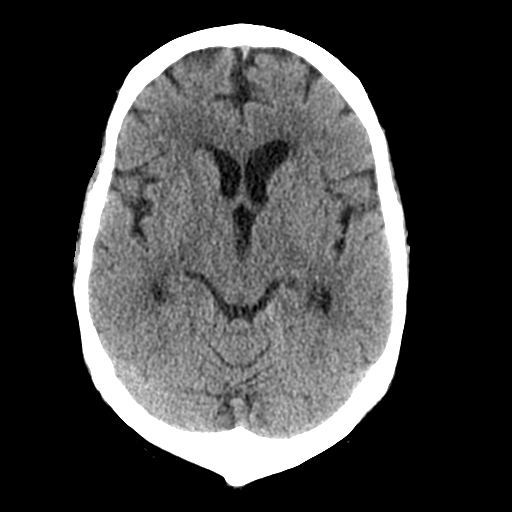
[im 16/32  brain]
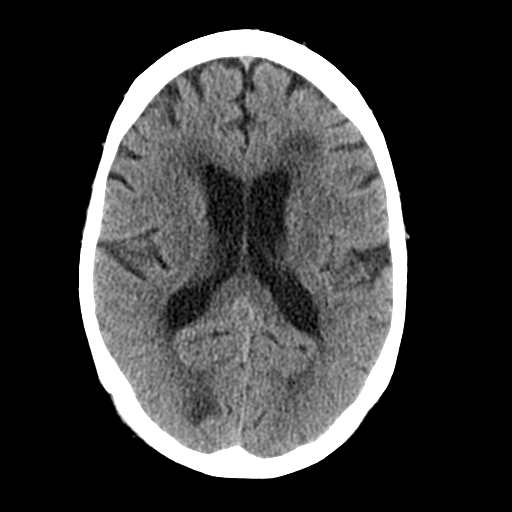
[im 20/32  brain]
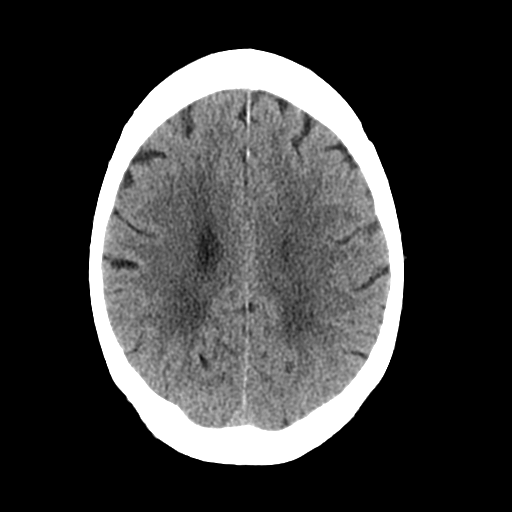
[im 20/32  bone]
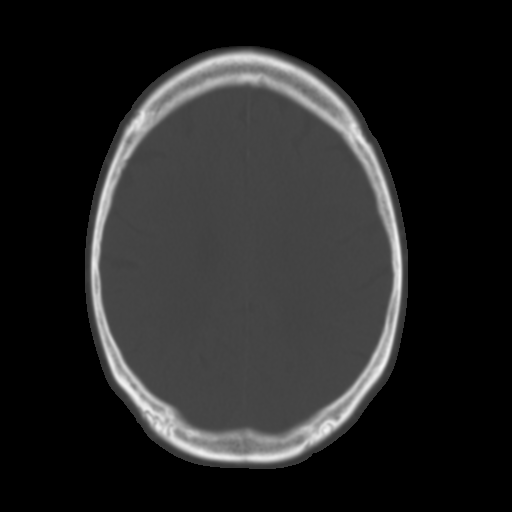
[im 24/32  brain]
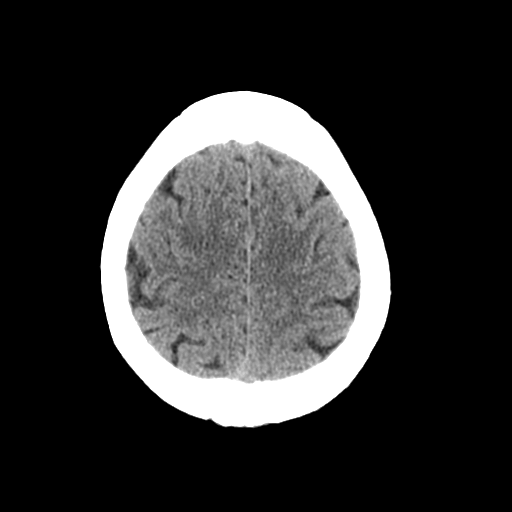
[im 28/32  brain]
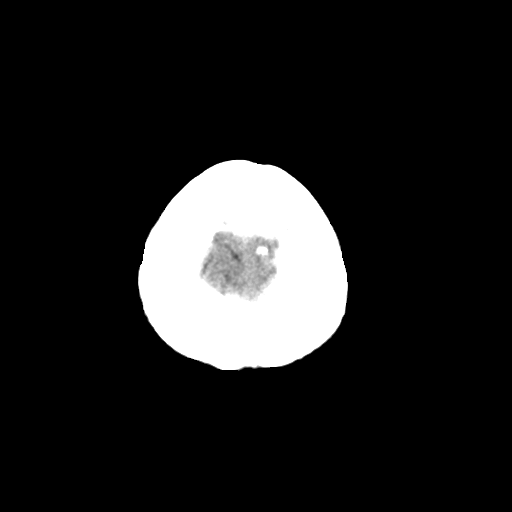

[Series 4: coronal soft tissue · coronal · 0.29mm/px · 3 of 67 slices shown]
[im 23/67  brain]
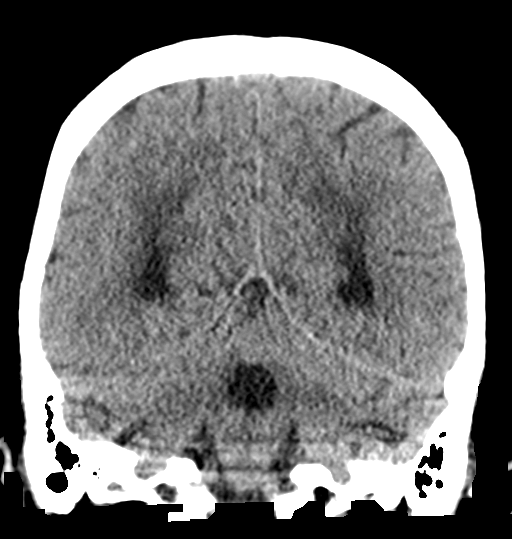
[im 30/67  brain]
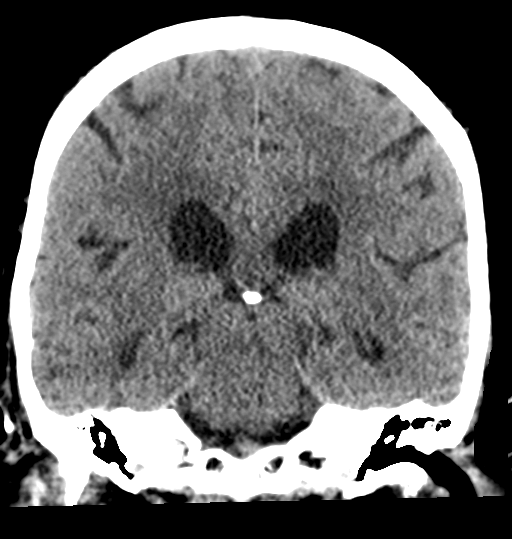
[im 37/67  brain]
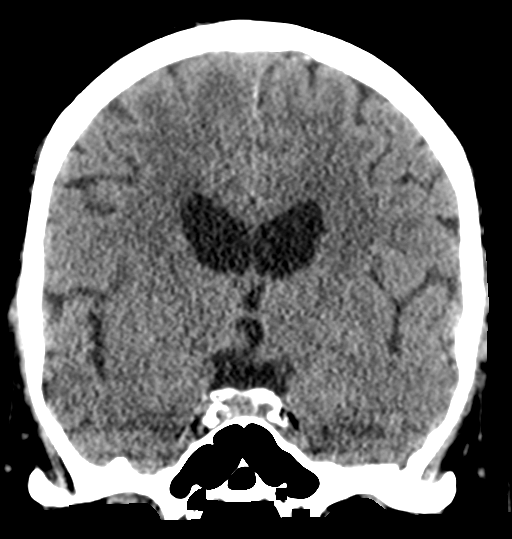

[Series 5: sagittal soft tissue · sagittal · 0.31mm/px · 3 of 51 slices shown]
[im 17/51  brain]
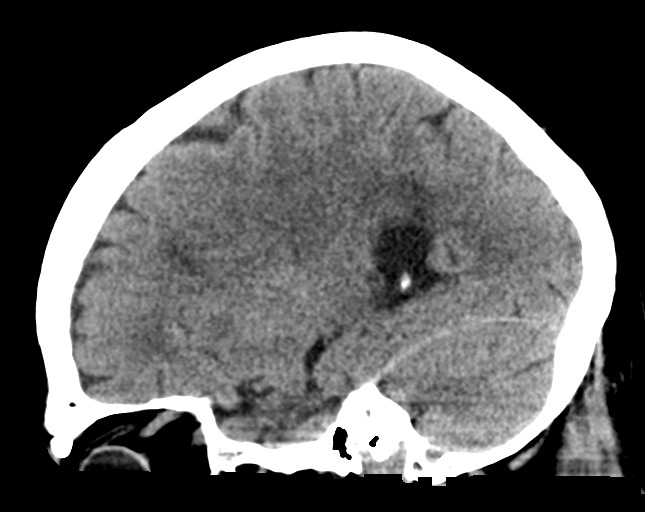
[im 26/51  brain]
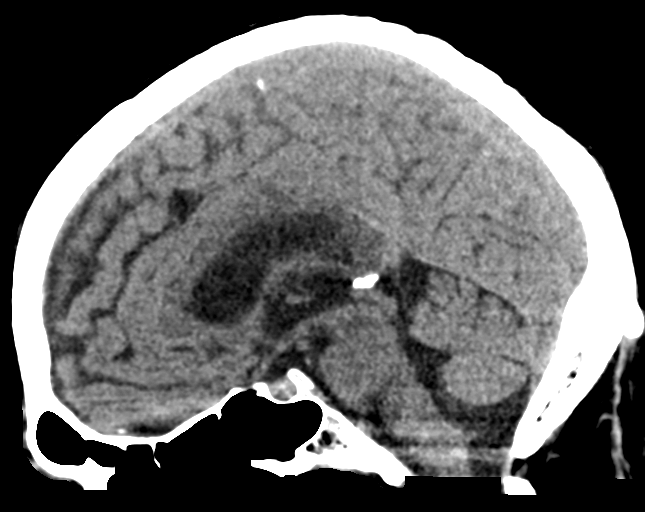
[im 34/51  brain]
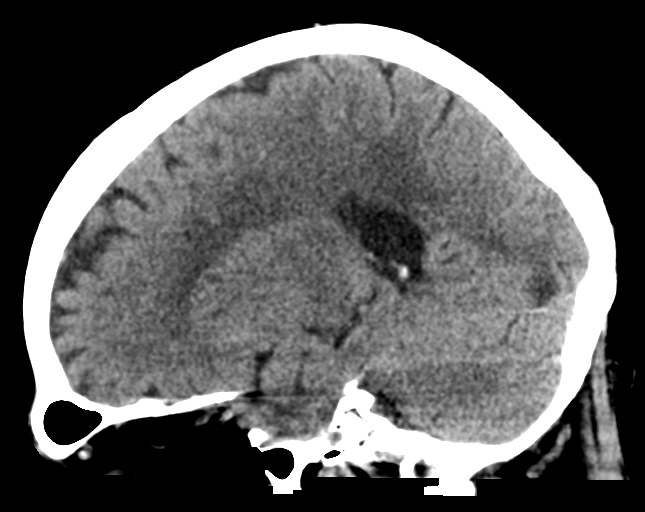

[16 of 47 positions shown; findings below may reference images not displayed]

FINDINGS: Brain: Mild age-related atrophy and chronic microvascular ischemic
changes. There is a focal area of old infarct and encephalomalacia
in the right occipital lobe. There is no acute intracranial
hemorrhage. No mass effect or midline shift. No extra-axial fluid
collection.

Vascular: No hyperdense vessel or unexpected calcification.

Skull: Normal. Negative for fracture or focal lesion.

Sinuses/Orbits: No acute finding.

Other: None
IMPRESSION: 1. No acute intracranial pathology.
2. Mild age-related atrophy and chronic microvascular ischemic
changes. Old right occipital infarct and encephalomalacia.

## 2019-11-19 MED ORDER — SODIUM CHLORIDE 0.9% FLUSH: 3.0000 mL | Freq: Once | INTRAVENOUS | Status: DC

## 2019-11-19 NOTE — ED Provider Notes (Signed)
[facility] Emergency Department Provider Note  Time seen: 8:19 PM  I have reviewed the triage vital signs and the nursing notes.   HISTORY  Chief Complaint Loss of Consciousness   HPI Jorge Cherry is a 84 y.o. male with a past medical history of arthritis, CKD, CAD, gastric reflux, paroxysmal atrial fibrillation status post recent aortic valve replacement 11/14/2019 presents to the emergency department after syncopal event.  According to the patient he got out of his truck to go get food and felt very lightheaded fell to the ground had a brief LOC.  Patient does believe he hit his head on the truck while falling to the ground.  States he felt nauseated but denies any chest pain at any point.  Denies shortness of breath.  Patient states he feels fairly normal.  Patient is wearing a cardiac monitor placed after the surgery per patient.  Denies any chest pain now or at any point.  No shortness of breath.   Past Medical History:  Diagnosis Date   Arthritis    "just a touch in my hands" (11/24/2016)   Asthma    Atherosclerosis of renal artery (HCC)    RENAL DOPPLER, 12/10/2011 - Left renal artery demonstrated narrowing with elevated velocities consistent with a 1-59% diameter reduction   CKD (chronic kidney disease) stage 3, GFR 30-59 ml/min    "stable now since they backed off the water pills" (11/24/2016)   Coronary artery disease    a. 1994 s/p CABG x 4 (LIMA-LAD, VG->D2, VG->OM, VG->RCA); b. 02/2004 PCI SVG-D2 (3.5x16 Taxus DES). VG->RCA 100. Sev apical LAD dzs distal to LIMA insertion; c. 11/2016 Cath: LAD 95p/179m, D2 100ost, LCX 100ost, 70p/m, RCA 100p/m, RPDA fills via L->R collats. VG->RCA 100, VG->D2 20 ost, patent prox stent, 68m, LIMA->LAD ok, VG->OM3 20p. EF 55%-->Med Rx.   GERD (gastroesophageal reflux disease)    High cholesterol    History of lower GI bleeding    a. 09/2014 GIB due to diverticulosis/diverticulitis.   Iron deficiency anemia    Labile  Hypertension    Moderate aortic stenosis    a. 10/2011 Echo:  EF >55%, mild-mod TR, mild-mod AS, mod Ca2+ of AoV leaflets; b. 07/2016 Echo: EF 60-65%, no rwma, Gr1 DD, mod AS [(S) mean grad , peak grad . Valve area (VTI): 1.33cm^2, (Vmax) 1.44cm^2. Mild MR]; c. 02/2018 Echo: EF 55-60%, no rwma, GR1 DD, Mod AS [Peak Vel (S): 319cm/s, Mean grad (S) , peak grad (S) 28mmHg].   PAF (paroxysmal atrial fibrillation) (HCC)    a. CHA2DS2VASc = 4-->Eliquis.   S/P TAVR (transcatheter aortic valve replacement) 11/14/2019   s/p TAVR with a 26 mm Edwards S3U via the TF approach by Drs Clifton James & Bartle   Sleep apnea     Patient Active Problem List   Diagnosis Date Noted   Renal lesion 11/15/2019   S/P TAVR (transcatheter aortic valve replacement) 11/14/2019   Bradycardia 03/03/2018   Chronic anticoagulation 12/14/2017   Anemia 11/25/2016   Coronary artery disease 11/24/2016   Acute on chronic diastolic heart failure (HCC) 05/26/2016   Junctional (nodal) bradycardia 11/20/2015   Paroxysmal atrial fibrillation (HCC) 11/20/2015   Hyperlipidemia LDL goal <70 05/03/2015   Mild persistent asthma 03/19/2015   Gastroesophageal reflux disease 03/19/2015   Essential hypertension 02/07/2015   Pain in the chest    Chronic renal insufficiency, stage III (moderate) (HCC) 12/28/2014   Lower GI bleed 09/24/2014   Diverticulosis of colon with hemorrhage 09/24/2014   Symptomatic  anemia 09/24/2014   Severe aortic stenosis 07/22/2013   Hyperlipidemia with target LDL less than 70 10/27/2012   Hearing decreased 10/27/2012   Hx of CABG 10/27/2012   Coronary atherosclerosis of native coronary artery 10/27/2012    Past Surgical History:  Procedure Laterality Date   CARDIAC CATHETERIZATION  02/27/2004   Coronary intervention and medical management   CARDIAC CATHETERIZATION  11/24/2016   CATARACT EXTRACTION W/ INTRAOCULAR LENS IMPLANT Left    CORONARY ANGIOPLASTY   1994 X 2   "before bypass surgery"   CORONARY ANGIOPLASTY WITH STENT PLACEMENT  03/04/2004   SVG supplying the diagonal vessel stented with a 3.5x72mm Taxus stent post dilated to 4.0 mm   CORONARY ARTERY BYPASS GRAFT  1994   "CABG X4"   INGUINAL HERNIA REPAIR Right    RIGHT HEART CATH AND CORONARY/GRAFT ANGIOGRAPHY N/A 11/24/2016   Procedure: Right Heart Cath and Coronary/Graft Angiography;  Surgeon: Lennette Bihari, MD;  Location: [facility] INVASIVE CV LAB;  Service: Cardiovascular;  Laterality: N/A;   RIGHT/LEFT HEART CATH AND CORONARY/GRAFT ANGIOGRAPHY N/A 10/18/2019   Procedure: RIGHT/LEFT HEART CATH AND CORONARY/GRAFT ANGIOGRAPHY;  Surgeon: Lennette Bihari, MD;  Location: [facility] INVASIVE CV LAB;  Service: Cardiovascular;  Laterality: N/A;   TEE WITHOUT CARDIOVERSION N/A 11/14/2019   Procedure: TRANSESOPHAGEAL ECHOCARDIOGRAM (TEE);  Surgeon: Kathleene Hazel, MD;  Location: [facility] INVASIVE CV LAB;  Service: Open Heart Surgery;  Laterality: N/A;   TONSILLECTOMY AND ADENOIDECTOMY     TRANSCATHETER AORTIC VALVE REPLACEMENT, TRANSFEMORAL N/A 11/14/2019   Procedure: TRANSCATHETER AORTIC VALVE REPLACEMENT, TRANSFEMORAL;  Surgeon: Kathleene Hazel, MD;  Location: [facility] INVASIVE CV LAB;  Service: Open Heart Surgery;  Laterality: N/A;    Prior to Admission medications   Medication Sig Start Date End Date Taking? Authorizing Provider  albuterol (VENTOLIN HFA) 108 (90 Base) MCG/ACT inhaler INHALE TWO PUFFS EVERY 4-6 HOURS AS NEEDED FOR COUGH OR WHEEZE Patient taking differently: Inhale 2 puffs into the lungs every 4 (four) hours as needed for wheezing or shortness of breath.  10/30/19   Kozlow, Alvira Philips, MD  amiodarone (PACERONE) 200 MG tablet Take 1 tablet (200 mg total) by mouth daily. 09/21/19   Lennette Bihari, MD  amLODipine (NORVASC) 2.5 MG tablet Take 1 tablet (2.5 mg total) by mouth 2 (two) times daily. NEED OV. 06/12/19   Lennette Bihari, MD  aspirin 81 MG chewable tablet Chew 1 tablet (81 mg total)  by mouth daily. 11/16/19   Janetta Hora, PA-C  atorvastatin (LIPITOR) 20 MG tablet Take 1 tablet (20 mg total) by mouth at bedtime. 03/16/18   Barnetta Chapel, MD  Azelastine-Fluticasone (DYMISTA) 137-50 MCG/ACT SUSP Place 1-2 sprays into the nose daily as needed (for seasonal allergies).    [provider]  docusate sodium (COLACE) 100 MG capsule Take 100 mg by mouth daily.     [provider]  ELIQUIS 2.5 MG TABS tablet TAKE 1 TABLET BY MOUTH TWICE A DAY 09/12/19   Lennette Bihari, MD  ezetimibe (ZETIA) 10 MG tablet TAKE 1 TABLET BY MOUTH EVERY DAY Patient taking differently: Take 10 mg by mouth daily. TAKE 1 TABLET BY MOUTH EVERY DAY 04/25/19   Lennette Bihari, MD  fenofibrate (TRICOR) 145 MG tablet Take 0.5 tablets (72.5 mg total) by mouth daily. 02/01/19   Lennette Bihari, MD  fluticasone (FLONASE) 50 MCG/ACT nasal spray Place 2 sprays into both nostrils daily.     [provider]  fluticasone (FLOVENT HFA) 110  MCG/ACT inhaler Inhale 1 puff into the lungs 3 (three) times daily as needed (for seasonal flares).     [provider]  ipratropium (ATROVENT) 0.06 % nasal spray Can use two sprays in each nostril every six hours as needed to dry up nose. Patient taking differently: Place 2 sprays into both nostrils 3 (three) times daily.  02/28/19   Kozlow, Alvira Philips, MD  isosorbide mononitrate (IMDUR) 60 MG 24 hr tablet TAKE 1 AND 1/2 TABS ( ) BY MOUTH EVERY MORNING AND 1/2 TAB EVERY EVENING Patient taking differently: Take 30-90 mg by mouth See admin instructions. TAKE 1 AND 1/2 TABS ( ) BY MOUTH EVERY MORNING AND 1/2 TAB EVERY EVENING 09/07/19   Lennette Bihari, MD  levothyroxine (SYNTHROID) 25 MCG tablet Take 25 mcg by mouth daily before breakfast.  07/08/19   [provider]  loratadine (CLARITIN) 10 MG tablet Take 10 mg by mouth every evening.     [provider]  Multiple Vitamins-Minerals (CENTRUM SILVER 50+MEN) TABS Take 1 tablet by  mouth at bedtime.    [provider]  nitroGLYCERIN (NITROLINGUAL) 0.4 MG/SPRAY spray PLACE 1 SPRAY UNDER THE TONGUE EVERY 5 (FIVE) MINUTES X 3 DOSES AS NEEDED FOR CHEST PAIN. 11/17/19   Kathleene Hazel, MD  Omega-3 Fatty Acids (FISH OIL) 1000 MG CAPS Take 1,000 mg by mouth 2 (two) times daily.     [provider]  pantoprazole (PROTONIX) 40 MG tablet TAKE 1 TABLET BY MOUTH EVERY DAY Patient taking differently: Take 40 mg by mouth daily.  10/02/19   Kozlow, Alvira Philips, MD  ranolazine (RANEXA) 500 MG 12 hr tablet TAKE 1 TABLET BY MOUTH TWICE A DAY Patient taking differently: Take 500 mg by mouth 2 (two) times daily.  09/19/19   Lennette Bihari, MD  vitamin C (ASCORBIC ACID) 500 MG tablet Take 500 mg by mouth at bedtime.     [provider]  vitamin E 400 UNIT capsule Take 400 Units by mouth daily.    [provider]  zinc gluconate 50 MG tablet Take 50 mg by mouth 2 (two) times a week.    [provider]    Allergies  Allergen Reactions   Altace [Ramipril] Swelling and Other (See Comments)    Mouth swelling   Mucinex [Guaifenesin Er] Hives, Swelling and Other (See Comments)    Mouth swelling   Contrast Media [Iodinated Diagnostic Agents] Other (See Comments)    Made eyes change each time    Family History  Problem Relation Age of Onset   Cancer Mother    Heart disease Father        died age 23   Stroke Maternal Grandmother    Cancer Maternal Grandfather    Allergic rhinitis Neg Hx    Angioedema Neg Hx    Asthma Neg Hx    Atopy Neg Hx    Eczema Neg Hx    Immunodeficiency Neg Hx    Urticaria Neg Hx     Social History Social History   Tobacco Use   Smoking status: Never Smoker   Smokeless tobacco: Never Used  Building services engineer Use: Never used  Substance Use Topics   Alcohol use: No    Alcohol/week: 0.0 standard drinks   Drug use: No    Review of Systems Constitutional: Positive for brief LOC.  Negative  for fever. Cardiovascular: Negative for chest pain. Respiratory: Negative for shortness of breath. Gastrointestinal: Negative for abdominal pain, vomiting.  Positive  for nausea. Musculoskeletal: Negative for musculoskeletal complaints Neurological: Negative for headache All other ROS negative  ____________________________________________   PHYSICAL EXAM:  VITAL SIGNS: ED Triage Vitals  Enc Vitals Group     BP 11/19/19 2001 (!) 176/81     Pulse Rate 11/19/19 2001 74     Resp 11/19/19 2001 19     Temp 11/19/19 2007 99.8 F (37.7 C)     Temp Source 11/19/19 2007 Oral     SpO2 11/19/19 1959 94 %     Weight --      Height --      Head Circumference --      Peak Flow --      Pain Score 11/19/19 2008 0     Pain Loc --      Pain Edu? --      Excl. in GC? --     Constitutional: Alert and oriented. Well appearing and in no distress. Eyes: Normal exam ENT      Head: Mild hematoma behind the right ear.      Mouth/Throat: Mucous membranes are moist. Cardiovascular: Irregular rhythm rate around 50 bpm.  No significant murmur. Respiratory: Normal respiratory effort without tachypnea nor retractions. Breath sounds are clear Gastrointestinal: Soft and nontender. No distention.   Musculoskeletal: Nontender with normal range of motion in all extremities. Neurologic:  Normal speech and language. No gross focal neurologic deficits Skin:  Skin is warm, dry and intact.  Psychiatric: Mood and affect are normal.   ____________________________________________    EKG  EKG viewed and interpreted by myself shows atrial fibrillation at 52 bpm with a widened QRS, normal axis, normal intervals nonspecific ST changes.  ____________________________________________    RADIOLOGY  CT shows no acute abnormality.  ____________________________________________   INITIAL IMPRESSION / ASSESSMENT AND PLAN / ED COURSE  Pertinent labs & imaging results that were available during my care of the  patient were reviewed by me and considered in my medical decision making (see chart for details).   Patient presents to the emergency department after syncopal episode approximately 4 days status post aortic valve replacement.  Patient states anticoagulation we will obtain a CT scan of the head as he believes he hit his head when he fell.  Patient is currently in atrial fibrillation rate around 50 to 60 bpm.  Per EMS patient was briefly around 35 bpm.  Patient has a cardiac monitor on, daughter is bringing the interrogation device.  We will check labs, continue to closely monitor on telemetry.  Patient does have a low-grade nine 9.8 temperature denies any shortness of breath cough or dysuria.  We will check urinalysis as well as a chest x-ray as a precaution.  We will likely discussed with the patient's cardiologist once his work-up is been completed.  Patient's lab work today shows a mild troponin elevation of 39.  I spoke to Dr. Ashley Royalty of cardiology at [facility]  He was able to remotely see the patient's ZIO patch which reported sinus pauses between 8 and 11 seconds.  The sinus pauses was most likely the reason for the syncopal event.  Patient will require admission to the hospital, I am currently waiting to hear back to see if they would like the patient transferred to [facility] for possible pacemaker consideration.  I spoke to Dr. Ashley Royalty who is accepted the patient to [facility].  They do not have beds available tonight, but we do not have beds available tonight either.  They would  likely transfer in the morning.  We will monitor the patient here until bed becomes available.  Casin S Malstrom was evaluated in Emergency Department on 11/19/2019 for the symptoms described in the history of present illness. He was evaluated in the context of the global COVID-19 pandemic, which necessitated consideration that the patient might be at risk for infection with the SARS-CoV-2 virus that causes  COVID-19. Institutional protocols and algorithms that pertain to the evaluation of patients at risk for COVID-19 are in a state of rapid change based on information released by regulatory bodies including the CDC and federal and state organizations. These policies and algorithms were followed during the patient's care in the ED.  ____________________________________________   FINAL CLINICAL IMPRESSION(S) / ED DIAGNOSES  Syncope Sinus pauses   Minna AntisPaduchowski, Nasirah Sachs, MD 11/19/19 2218

## 2019-11-19 NOTE — ED Triage Notes (Signed)
Report per EMSP Pt driving, became dizzy,  Pulled over and got out of car, became syncopal, falling and striking back of head R occupital. Pt has blood and swelling below ear. Pt s/p aortic valve replacement 11/15/19. Pacemeaker removed at that time. Pt A&O x 4 upon arrival to ED. Pt denies pain at this time. Pt is anticoagulated with Eliquis  and is on a home cardiac monitor.

## 2019-11-20 ENCOUNTER — Ambulatory Visit (HOSPITAL_COMMUNITY)
Admission: EM | Admit: 2019-11-20 | Discharge: 2019-11-21 | Disposition: A | Discharge status: Home / Self Care | Payer: Medicare PPO | Source: Ambulatory Visit | Attending: Internal Medicine | Admitting: Internal Medicine

## 2019-11-20 ENCOUNTER — Ambulatory Visit (HOSPITAL_COMMUNITY)
Admission: EM | Disposition: A | Discharge status: Home / Self Care | Payer: Self-pay | Source: Ambulatory Visit | Attending: Internal Medicine

## 2019-11-20 DIAGNOSIS — Z7982 Long term (current) use of aspirin: Secondary | ICD-10-CM | POA: Diagnosis not present

## 2019-11-20 DIAGNOSIS — Z91041 Radiographic dye allergy status: Secondary | ICD-10-CM | POA: Diagnosis not present

## 2019-11-20 DIAGNOSIS — I2510 Atherosclerotic heart disease of native coronary artery without angina pectoris: Secondary | ICD-10-CM | POA: Diagnosis not present

## 2019-11-20 DIAGNOSIS — K219 Gastro-esophageal reflux disease without esophagitis: Secondary | ICD-10-CM | POA: Insufficient documentation

## 2019-11-20 DIAGNOSIS — I480 Paroxysmal atrial fibrillation: Secondary | ICD-10-CM

## 2019-11-20 DIAGNOSIS — R55 Syncope and collapse: Secondary | ICD-10-CM | POA: Diagnosis not present

## 2019-11-20 DIAGNOSIS — E7800 Pure hypercholesterolemia, unspecified: Secondary | ICD-10-CM | POA: Insufficient documentation

## 2019-11-20 DIAGNOSIS — Z79899 Other long term (current) drug therapy: Secondary | ICD-10-CM | POA: Diagnosis not present

## 2019-11-20 DIAGNOSIS — Z79890 Hormone replacement therapy (postmenopausal): Secondary | ICD-10-CM | POA: Diagnosis not present

## 2019-11-20 DIAGNOSIS — Z7901 Long term (current) use of anticoagulants: Secondary | ICD-10-CM | POA: Diagnosis not present

## 2019-11-20 DIAGNOSIS — M19041 Primary osteoarthritis, right hand: Secondary | ICD-10-CM | POA: Diagnosis not present

## 2019-11-20 DIAGNOSIS — I442 Atrioventricular block, complete: Secondary | ICD-10-CM | POA: Diagnosis not present

## 2019-11-20 DIAGNOSIS — Z955 Presence of coronary angioplasty implant and graft: Secondary | ICD-10-CM | POA: Diagnosis not present

## 2019-11-20 DIAGNOSIS — Z951 Presence of aortocoronary bypass graft: Secondary | ICD-10-CM | POA: Diagnosis not present

## 2019-11-20 DIAGNOSIS — J45909 Unspecified asthma, uncomplicated: Secondary | ICD-10-CM | POA: Insufficient documentation

## 2019-11-20 DIAGNOSIS — I455 Other specified heart block: Secondary | ICD-10-CM | POA: Diagnosis not present

## 2019-11-20 DIAGNOSIS — Z959 Presence of cardiac and vascular implant and graft, unspecified: Secondary | ICD-10-CM

## 2019-11-20 DIAGNOSIS — G4730 Sleep apnea, unspecified: Secondary | ICD-10-CM | POA: Diagnosis not present

## 2019-11-20 DIAGNOSIS — I4819 Other persistent atrial fibrillation: Secondary | ICD-10-CM | POA: Diagnosis not present

## 2019-11-20 DIAGNOSIS — E039 Hypothyroidism, unspecified: Secondary | ICD-10-CM | POA: Diagnosis not present

## 2019-11-20 DIAGNOSIS — I447 Left bundle-branch block, unspecified: Secondary | ICD-10-CM | POA: Diagnosis not present

## 2019-11-20 DIAGNOSIS — Z952 Presence of prosthetic heart valve: Secondary | ICD-10-CM | POA: Insufficient documentation

## 2019-11-20 DIAGNOSIS — I452 Bifascicular block: Secondary | ICD-10-CM

## 2019-11-20 DIAGNOSIS — Z8249 Family history of ischemic heart disease and other diseases of the circulatory system: Secondary | ICD-10-CM | POA: Diagnosis not present

## 2019-11-20 DIAGNOSIS — Z888 Allergy status to other drugs, medicaments and biological substances status: Secondary | ICD-10-CM | POA: Diagnosis not present

## 2019-11-20 DIAGNOSIS — N183 Chronic kidney disease, stage 3 unspecified: Secondary | ICD-10-CM | POA: Insufficient documentation

## 2019-11-20 DIAGNOSIS — I701 Atherosclerosis of renal artery: Secondary | ICD-10-CM | POA: Diagnosis not present

## 2019-11-20 DIAGNOSIS — I130 Hypertensive heart and chronic kidney disease with heart failure and stage 1 through stage 4 chronic kidney disease, or unspecified chronic kidney disease: Secondary | ICD-10-CM | POA: Insufficient documentation

## 2019-11-20 DIAGNOSIS — M19042 Primary osteoarthritis, left hand: Secondary | ICD-10-CM | POA: Diagnosis not present

## 2019-11-20 HISTORY — PX: PACEMAKER IMPLANT: EP1218

## 2019-11-20 LAB — URINALYSIS, COMPLETE (UACMP) WITH MICROSCOPIC
Bacteria, UA: NONE SEEN
Bilirubin Urine: NEGATIVE
Glucose, UA: NEGATIVE mg/dL
Hgb urine dipstick: NEGATIVE
Ketones, ur: NEGATIVE mg/dL
Leukocytes,Ua: NEGATIVE
Nitrite: NEGATIVE
Protein, ur: 100 mg/dL — AB
Specific Gravity, Urine: 1.019 (ref 1.005–1.030)
Squamous Epithelial / HPF: NONE SEEN (ref 0–5)
pH: 6 (ref 5.0–8.0)

## 2019-11-20 LAB — TROPONIN I (HIGH SENSITIVITY): Troponin I (High Sensitivity): 46 ng/L — ABNORMAL HIGH (ref <18)

## 2019-11-20 LAB — TSH: TSH: 1.744 u[IU]/mL (ref 0.350–4.500)

## 2019-11-20 SURGERY — PACEMAKER IMPLANT

## 2019-11-20 MED ORDER — METHYLPREDNISOLONE SODIUM SUCC 125 MG IJ SOLR
INTRAMUSCULAR | Status: AC
  Filled 2019-11-20: qty 2

## 2019-11-20 MED ORDER — LIDOCAINE HCL 1 % IJ SOLN
INTRAMUSCULAR | Status: AC
  Filled 2019-11-20: qty 20

## 2019-11-20 MED ORDER — CEFAZOLIN SODIUM-DEXTROSE 1-4 GM/50ML-% IV SOLN
1.0000 g | Freq: Four times a day (QID) | INTRAVENOUS | Status: AC
  Administered 2019-11-20 – 2019-11-21 (×3): 1 g via INTRAVENOUS
  Filled 2019-11-20 (×3): qty 50

## 2019-11-20 MED ORDER — SODIUM CHLORIDE 0.9 % IV SOLN: INTRAVENOUS | Status: DC

## 2019-11-20 MED ORDER — MIDAZOLAM HCL 5 MG/5ML IJ SOLN
INTRAMUSCULAR | Status: DC | PRN
  Administered 2019-11-20: 1 mg via INTRAVENOUS

## 2019-11-20 MED ORDER — RANOLAZINE ER 500 MG PO TB12
500.0000 mg | ORAL_TABLET | Freq: Two times a day (BID) | ORAL | Status: DC
  Administered 2019-11-20 – 2019-11-21 (×2): 500 mg via ORAL
  Filled 2019-11-20 (×2): qty 1

## 2019-11-20 MED ORDER — LORATADINE 10 MG PO TABS
10.0000 mg | ORAL_TABLET | Freq: Every evening | ORAL | Status: DC
  Administered 2019-11-20: 10 mg via ORAL
  Filled 2019-11-20: qty 1

## 2019-11-20 MED ORDER — METHYLPREDNISOLONE SODIUM SUCC 125 MG IJ SOLR
125.0000 mg | Freq: Once | INTRAMUSCULAR | Status: AC
  Administered 2019-11-20: 125 mg via INTRAVENOUS

## 2019-11-20 MED ORDER — ISOSORBIDE MONONITRATE ER 30 MG PO TB24: 30.0000 mg | ORAL_TABLET | ORAL | Status: DC

## 2019-11-20 MED ORDER — EZETIMIBE 10 MG PO TABS
10.0000 mg | ORAL_TABLET | Freq: Every day | ORAL | Status: DC
  Administered 2019-11-20 – 2019-11-21 (×2): 10 mg via ORAL
  Filled 2019-11-20 (×2): qty 1

## 2019-11-20 MED ORDER — ALBUTEROL SULFATE HFA 108 (90 BASE) MCG/ACT IN AERS: 2.0000 | INHALATION_SPRAY | RESPIRATORY_TRACT | Status: DC | PRN

## 2019-11-20 MED ORDER — PANTOPRAZOLE SODIUM 40 MG PO TBEC
40.0000 mg | DELAYED_RELEASE_TABLET | Freq: Every day | ORAL | Status: DC
  Administered 2019-11-20 – 2019-11-21 (×2): 40 mg via ORAL
  Filled 2019-11-20 (×2): qty 1

## 2019-11-20 MED ORDER — FUROSEMIDE 10 MG/ML IJ SOLN
40.0000 mg | Freq: Two times a day (BID) | INTRAMUSCULAR | Status: DC
  Administered 2019-11-21: 40 mg via INTRAVENOUS
  Filled 2019-11-20: qty 4

## 2019-11-20 MED ORDER — FUROSEMIDE 10 MG/ML IJ SOLN
INTRAMUSCULAR | Status: DC | PRN
  Administered 2019-11-20: 40 mg via INTRAVENOUS

## 2019-11-20 MED ORDER — LIDOCAINE HCL (PF) 1 % IJ SOLN
INTRAMUSCULAR | Status: DC | PRN
  Administered 2019-11-20: 60 mL

## 2019-11-20 MED ORDER — NITROGLYCERIN 0.4 MG/SPRAY TL SOLN
1.0000 | Status: DC | PRN
  Filled 2019-11-20: qty 12

## 2019-11-20 MED ORDER — ISOSORBIDE MONONITRATE ER 60 MG PO TB24
90.0000 mg | ORAL_TABLET | Freq: Every day | ORAL | Status: DC
  Administered 2019-11-21: 90 mg via ORAL
  Filled 2019-11-20: qty 1

## 2019-11-20 MED ORDER — HYDRALAZINE HCL 20 MG/ML IJ SOLN
INTRAMUSCULAR | Status: AC
  Filled 2019-11-20: qty 1

## 2019-11-20 MED ORDER — CEFAZOLIN SODIUM-DEXTROSE 2-3 GM-%(50ML) IV SOLR
INTRAVENOUS | Status: DC | PRN
  Administered 2019-11-20: 2 g via INTRAVENOUS

## 2019-11-20 MED ORDER — CEFAZOLIN SODIUM-DEXTROSE 2-4 GM/100ML-% IV SOLN
INTRAVENOUS | Status: AC
  Filled 2019-11-20: qty 100

## 2019-11-20 MED ORDER — MIDAZOLAM HCL 5 MG/5ML IJ SOLN
INTRAMUSCULAR | Status: AC
  Filled 2019-11-20: qty 5

## 2019-11-20 MED ORDER — AMLODIPINE BESYLATE 2.5 MG PO TABS
2.5000 mg | ORAL_TABLET | Freq: Two times a day (BID) | ORAL | Status: DC
  Administered 2019-11-20 – 2019-11-21 (×2): 2.5 mg via ORAL
  Filled 2019-11-20 (×2): qty 1

## 2019-11-20 MED ORDER — ISOSORBIDE MONONITRATE ER 30 MG PO TB24
30.0000 mg | ORAL_TABLET | Freq: Every day | ORAL | Status: DC
  Administered 2019-11-20: 30 mg via ORAL
  Filled 2019-11-20: qty 1

## 2019-11-20 MED ORDER — FENTANYL CITRATE (PF) 100 MCG/2ML IJ SOLN
INTRAMUSCULAR | Status: DC | PRN
  Administered 2019-11-20: 25 ug via INTRAVENOUS

## 2019-11-20 MED ORDER — FLUTICASONE PROPIONATE 50 MCG/ACT NA SUSP
2.0000 | Freq: Every day | NASAL | Status: DC | PRN
  Filled 2019-11-20: qty 16

## 2019-11-20 MED ORDER — FENTANYL CITRATE (PF) 100 MCG/2ML IJ SOLN
INTRAMUSCULAR | Status: AC
  Filled 2019-11-20: qty 2

## 2019-11-20 MED ORDER — HEPARIN (PORCINE) IN NACL 2-0.9 UNITS/ML
INTRAMUSCULAR | Status: AC | PRN
  Administered 2019-11-20: 500 mL

## 2019-11-20 MED ORDER — LEVOTHYROXINE SODIUM 25 MCG PO TABS
25.0000 ug | ORAL_TABLET | Freq: Every day | ORAL | Status: DC
  Administered 2019-11-21: 25 ug via ORAL
  Filled 2019-11-20: qty 1

## 2019-11-20 MED ORDER — HEPARIN (PORCINE) IN NACL 1000-0.9 UT/500ML-% IV SOLN
INTRAVENOUS | Status: AC
  Filled 2019-11-20: qty 500

## 2019-11-20 MED ORDER — DIPHENHYDRAMINE HCL 50 MG/ML IJ SOLN
25.0000 mg | Freq: Once | INTRAMUSCULAR | Status: AC
  Administered 2019-11-20: 25 mg via INTRAVENOUS

## 2019-11-20 MED ORDER — SODIUM CHLORIDE 0.9 % IV SOLN: INTRAVENOUS | Status: DC | PRN

## 2019-11-20 MED ORDER — SODIUM CHLORIDE 0.9 % IV SOLN
INTRAVENOUS | Status: AC
  Filled 2019-11-20: qty 2

## 2019-11-20 MED ORDER — DIPHENHYDRAMINE HCL 50 MG/ML IJ SOLN
INTRAMUSCULAR | Status: AC
  Filled 2019-11-20: qty 1

## 2019-11-20 MED ORDER — ONDANSETRON HCL 4 MG/2ML IJ SOLN: 4.0000 mg | Freq: Four times a day (QID) | INTRAMUSCULAR | Status: DC | PRN

## 2019-11-20 MED ORDER — HYDRALAZINE HCL 20 MG/ML IJ SOLN
5.0000 mg | Freq: Once | INTRAMUSCULAR | Status: AC
  Administered 2019-11-20: 5 mg via INTRAVENOUS

## 2019-11-20 MED ORDER — FUROSEMIDE 10 MG/ML IJ SOLN
INTRAMUSCULAR | Status: AC
  Filled 2019-11-20: qty 4

## 2019-11-20 MED ORDER — ATORVASTATIN CALCIUM 10 MG PO TABS
20.0000 mg | ORAL_TABLET | Freq: Every day | ORAL | Status: DC
  Administered 2019-11-20: 20 mg via ORAL
  Filled 2019-11-20: qty 2

## 2019-11-20 MED ORDER — ACETAMINOPHEN 325 MG PO TABS: 325.0000 mg | ORAL_TABLET | ORAL | Status: DC | PRN

## 2019-11-20 MED ORDER — ALBUTEROL SULFATE (2.5 MG/3ML) 0.083% IN NEBU: 2.5000 mg | INHALATION_SOLUTION | RESPIRATORY_TRACT | Status: DC | PRN

## 2019-11-20 MED ORDER — SODIUM CHLORIDE 0.9 % IV SOLN: Freq: Once | INTRAVENOUS | Status: DC

## 2019-11-20 SURGICAL SUPPLY — 9 items
CABLE SURGICAL S-101-97-12 (CABLE) ×3 IMPLANT
HEMOSTAT SURGICEL 2X4 FIBR (HEMOSTASIS) ×3 IMPLANT
IPG PACE AZUR XT DR MRI W1DR01 (Pacemaker) ×1 IMPLANT
LEAD CAPSURE NOVUS 5076-52CM (Lead) ×3 IMPLANT
LEAD CAPSURE NOVUS 5076-58CM (Lead) ×3 IMPLANT
PACE AZURE XT DR MRI W1DR01 (Pacemaker) ×3 IMPLANT
PAD PRO RADIOLUCENT 2001M-C (PAD) ×3 IMPLANT
SHEATH 7FR PRELUDE SNAP 13 (SHEATH) ×6 IMPLANT
TRAY PACEMAKER INSERTION (PACKS) ×3 IMPLANT

## 2019-11-20 NOTE — ED Provider Notes (Signed)
Vitals:   11/20/19 0730 11/20/19 0800  BP: (!) 162/79 (!) 183/72  Pulse: (!) 31 68  Resp: 20 20  Temp: 98.7 F (37.1 C)   SpO2: 93% 96%     Visions resting comfortably.  He is in no distress.  Dr. Kirke Corin of cardiology just stepping out of the room as I entered.  Patient has no concerns at this time.  Had a couple small ice chips this morning, but otherwise nothing to eat or drink.  Patient understanding of plan, tells me that he understands he is going to probably go get a pacemaker this morning  Patient appears to be stable for transfer   Sharyn Creamer, MD 11/20/19 [phone]

## 2019-11-20 NOTE — Consult Note (Signed)
Cardiology Consultation:   Patient ID: Jorge Cherry; [id number]; 1936-01-20   Admit date: 11/19/2019 Date of Consult: 11/20/2019  Primary Care Provider: Chilton Greathouse, MD Primary Cardiologist: Jorge Cherry Electrophysiologist:  None   Patient Profile:   Jorge Cherry is a 84 y.o. male with a hx of CAD status four-vessel CABG in 1994 with LIMA to LAD, SVG to OM, SVG to diagonal, and SVG to RCA, severe aortic stenosis status post recent TAVR on 11/14/2019, PAF on Eliquis, HFpEF, CVA, CKD stage III with nonspecific renal lesions noted, prior GI bleed, HTN, HLD, and GERD who is being seen today for the evaluation of syncope at the request of Dr. Graciela Cherry.  History of Present Illness:   [name] underwent four-vessel CABG in 1994.  Subsequent LHC in 02/2004 with PCI to the SVG to D2 with documented occlusion of the SVG to RCA.  LHC in 11/2016 with severe native vessel CAD, known occluded SVG to RCA, patent SVG to 2nd diagonal, patent D2 stent, patent SVT to dOM branch, and patent LIMA to LAD, with an area of the  Proximal LAD which had 95% stenosis and was not supplied by the LIMA that was recommended for medical management.  Over the years he has been monitored for aortic stenosis with echo in 2019 showing normal LV SF with moderate aortic stenosis.  He was seen in 07/2019 with stable chronic angina and chronic lower extremity edema.  Repeat echo at that time showed an EF of 55 to 60%, grade 2 diastolic dysfunction, mild regurgitation, and moderate to severe aortic stenosis with a mean gradient of 35 mmHg, peak gradient 67 mercury, AVA 1.05 cm.  With worsening symptoms, he underwent diagnostic R/LHC on 10/18/2019 which demonstrated severe native vessel CAD with occlusion of all native vessels and 3 patent grafts (LIMA to LAD, SVG to diagonal, SVG to OM) with known CTO of the SVG to RCA.  Medical management was advised patient was subsequently evaluated by the structural heart team and underwent  successful TAVR with a 26 mm Edwards Sapien 3 Ultra THV on 11/14/2019.  Post procedure echo stable as outlined below.  His preprocedure EKG showed first-degree AV block with prior RBBB and left anterior fascicular block.  Post procedure he was now in A. fib with nonspecific IVCD and LBBB pattern with up to 2.5-second pauses.  In this setting, Zio patch was placed.  Patient indicates he was in his usual state of health around the 4th July when he was walking around a construction site of his daughter's new home when he reports he "blacked out."  He does not recall the circumstances surrounding this event.  However, he later tells me he did not suffer a syncopal episode and states he tripped over a rock.  More recently, on 7/11 the patient had driven to [employer] for dinner around 7 PM.  Upon getting out of the truck he became very dizzy and suffered a sudden syncopal episode striking the right side of his head along the rubber step of his truck.  He denies any preceding angina, palpitations, diaphoresis, nausea, or vomiting.  No seizure-like symptoms.  No loss of bowel or bladder function.  Indicates he was without consciousness for "a moment."  In this setting he was brought to [facility] ED last evening.  Review of his Zio patch showed multiple pauses lasting from 8 to 11 seconds in duration.  CT head showed no acute intracranial pathology.  Old right occipital infarct was noted.  Labs showed an initial high-sensitivity troponin of 39 with a delta pending.  Potassium 3.9.  Telemetry as demonstrated A. fib with slow ventricular response with bundle branch block with longest R-R interval being 3.7 seconds.  Case was discussed with the cardiology fellow at [facility] overnight who accepted the patient.  EP is aware of the patient and is planning on pacemaker today.  He is n.p.o.  His last dose of Eliquis was on the morning of 11/19/2019.  He is not on any AV nodal blocking medications.  Since his arrival to [facility] he has been  asymptomatic without any further syncope or dizziness.  No chest pain, dyspnea, nausea, vomiting.   Past Medical History:  Diagnosis Date  . Arthritis    "just a touch in my hands" (11/24/2016)  . Asthma   . Atherosclerosis of renal artery (HCC)    RENAL DOPPLER, 12/10/2011 - Left renal artery demonstrated narrowing with elevated velocities consistent with a 1-59% diameter reduction  . CKD (chronic kidney disease) stage 3, GFR 30-59 ml/min    "stable now since they backed off the water pills" (11/24/2016)  . Coronary artery disease    a. 1994 s/p CABG x 4 (LIMA-LAD, VG->D2, VG->OM, VG->RCA); b. 02/2004 PCI SVG-D2 (3.5x16 Taxus DES). VG->RCA 100. Sev apical LAD dzs distal to LIMA insertion; c. 11/2016 Cath: LAD 95p/146m, D2 100ost, LCX 100ost, 70p/m, RCA 100p/m, RPDA fills via L->R collats. VG->RCA 100, VG->D2 20 ost, patent prox stent, 29m, LIMA->LAD ok, VG->OM3 20p. EF 55%-->Med Rx.  . GERD (gastroesophageal reflux disease)   . High cholesterol   . History of lower GI bleeding    a. 09/2014 GIB due to diverticulosis/diverticulitis.  . Iron deficiency anemia   . Labile Hypertension   . Moderate aortic stenosis    a. 10/2011 Echo:  EF >55%, mild-mod TR, mild-mod AS, mod Ca2+ of AoV leaflets; b. 07/2016 Echo: EF 60-65%, no rwma, Gr1 DD, mod AS [(S) mean grad , peak grad . Valve area (VTI): 1.33cm^2, (Vmax) 1.44cm^2. Mild MR]; c. 02/2018 Echo: EF 55-60%, no rwma, GR1 DD, Mod AS [Peak Vel (S): 319cm/s, Mean grad (S) , peak grad (S) 45mmHg].  [name] PAF (paroxysmal atrial fibrillation) (HCC)    a. CHA2DS2VASc = 4-->Eliquis.  . S/P TAVR (transcatheter aortic valve replacement) 11/14/2019   s/p TAVR with a 26 mm Edwards S3U via the TF approach by Jorge Cherry & Jorge Cherry  . Sleep apnea     Past Surgical History:  Procedure Laterality Date  . CARDIAC CATHETERIZATION  02/27/2004   Coronary intervention and medical management  . CARDIAC CATHETERIZATION  11/24/2016  . CATARACT EXTRACTION W/  INTRAOCULAR LENS IMPLANT Left   . CORONARY ANGIOPLASTY  1994 X 2   "before bypass surgery"  . CORONARY ANGIOPLASTY WITH STENT PLACEMENT  03/04/2004   SVG supplying the diagonal vessel stented with a 3.5x29mm Taxus stent post dilated to 4.0 mm  . CORONARY ARTERY BYPASS GRAFT  1994   "CABG X4"  . INGUINAL HERNIA REPAIR Right   . RIGHT HEART CATH AND CORONARY/GRAFT ANGIOGRAPHY N/A 11/24/2016   Procedure: Right Heart Cath and Coronary/Graft Angiography;  Surgeon: Lennette Bihari, MD;  Location: [facility] INVASIVE CV LAB;  Service: Cardiovascular;  Laterality: N/A;  . RIGHT/LEFT HEART CATH AND CORONARY/GRAFT ANGIOGRAPHY N/A 10/18/2019   Procedure: RIGHT/LEFT HEART CATH AND CORONARY/GRAFT ANGIOGRAPHY;  Surgeon: Lennette Bihari, MD;  Location: [facility] INVASIVE CV LAB;  Service: Cardiovascular;  Laterality: N/A;  . TEE WITHOUT CARDIOVERSION N/A 11/14/2019  Procedure: TRANSESOPHAGEAL ECHOCARDIOGRAM (TEE);  Surgeon: Kathleene Hazel, MD;  Location: [facility] INVASIVE CV LAB;  Service: Open Heart Surgery;  Laterality: N/A;  . TONSILLECTOMY AND ADENOIDECTOMY    . TRANSCATHETER AORTIC VALVE REPLACEMENT, TRANSFEMORAL N/A 11/14/2019   Procedure: TRANSCATHETER AORTIC VALVE REPLACEMENT, TRANSFEMORAL;  Surgeon: Kathleene Hazel, MD;  Location: [facility] INVASIVE CV LAB;  Service: Open Heart Surgery;  Laterality: N/A;     Home Meds: Prior to Admission medications   Medication Sig Start Date End Date Taking? Authorizing Provider  albuterol (VENTOLIN HFA) 108 (90 Base) MCG/ACT inhaler INHALE TWO PUFFS EVERY 4-6 HOURS AS NEEDED FOR COUGH OR WHEEZE Patient taking differently: Inhale 2 puffs into the lungs every 4 (four) hours as needed for wheezing or shortness of breath.  10/30/19   Kozlow, Alvira Philips, MD  amiodarone (PACERONE) 200 MG tablet Take 1 tablet (200 mg total) by mouth daily. 09/21/19   Lennette Bihari, MD  amLODipine (NORVASC) 2.5 MG tablet Take 1 tablet (2.5 mg total) by mouth 2 (two) times daily. NEED OV. 06/12/19   Lennette Bihari, MD  aspirin 81 MG chewable tablet Chew 1 tablet (81 mg total) by mouth daily. 11/16/19   Janetta Hora, PA-C  atorvastatin (LIPITOR) 20 MG tablet Take 1 tablet (20 mg total) by mouth at bedtime. 03/16/18   Barnetta Chapel, MD  Azelastine-Fluticasone (DYMISTA) 137-50 MCG/ACT SUSP Place 1-2 sprays into the nose daily as needed (for seasonal allergies).    [provider]  docusate sodium (COLACE) 100 MG capsule Take 100 mg by mouth daily.     [provider]  ELIQUIS 2.5 MG TABS tablet TAKE 1 TABLET BY MOUTH TWICE A DAY 09/12/19   Lennette Bihari, MD  ezetimibe (ZETIA) 10 MG tablet TAKE 1 TABLET BY MOUTH EVERY DAY Patient taking differently: Take 10 mg by mouth daily. TAKE 1 TABLET BY MOUTH EVERY DAY 04/25/19   Lennette Bihari, MD  fenofibrate (TRICOR) 145 MG tablet Take 0.5 tablets (72.5 mg total) by mouth daily. 02/01/19   Lennette Bihari, MD  fluticasone (FLONASE) 50 MCG/ACT nasal spray Place 2 sprays into both nostrils daily.     [provider]  fluticasone (FLOVENT HFA) 110 MCG/ACT inhaler Inhale 1 puff into the lungs 3 (three) times daily as needed (for seasonal flares).     [provider]  ipratropium (ATROVENT) 0.06 % nasal spray Can use two sprays in each nostril every six hours as needed to dry up nose. Patient taking differently: Place 2 sprays into both nostrils 3 (three) times daily.  02/28/19   Kozlow, Alvira Philips, MD  isosorbide mononitrate (IMDUR) 60 MG 24 hr tablet TAKE 1 AND 1/2 TABS (90MG ) BY MOUTH EVERY MORNING AND 1/2 TAB EVERY EVENING Patient taking differently: Take 30-90 mg by mouth See admin instructions. TAKE 1 AND 1/2 TABS (90MG ) BY MOUTH EVERY MORNING AND 1/2 TAB EVERY EVENING 09/07/19   , MD  levothyroxine (SYNTHROID) 25 MCG tablet Take 25 mcg by mouth daily before breakfast.  07/08/19   [provider]  loratadine (CLARITIN) 10 MG tablet Take 10 mg by mouth every evening.     [provider]    Multiple Vitamins-Minerals (CENTRUM SILVER 50+MEN) TABS Take 1 tablet by mouth at bedtime.    [provider]  nitroGLYCERIN (NITROLINGUAL) 0.4 MG/SPRAY spray PLACE 1 SPRAY UNDER THE TONGUE EVERY 5 (FIVE) MINUTES X 3 DOSES AS NEEDED FOR CHEST PAIN. 11/17/19   07/10/19  D, MD  Omega-3 Fatty Acids (FISH OIL) 1000 MG CAPS Take 1,000 mg by mouth 2 (two) times daily.     [provider]  pantoprazole (PROTONIX) 40 MG tablet TAKE 1 TABLET BY MOUTH EVERY DAY Patient taking differently: Take 40 mg by mouth daily.  10/02/19   Kozlow, Alvira Philips, MD  ranolazine (RANEXA) 500 MG 12 hr tablet TAKE 1 TABLET BY MOUTH TWICE A DAY Patient taking differently: Take 500 mg by mouth 2 (two) times daily.  09/19/19   Lennette Bihari, MD  vitamin C (ASCORBIC ACID) 500 MG tablet Take 500 mg by mouth at bedtime.     [provider]  vitamin E 400 UNIT capsule Take 400 Units by mouth daily.    [provider]  zinc gluconate 50 MG tablet Take 50 mg by mouth 2 (two) times a week.    [provider]    Inpatient Medications: Scheduled Meds: . sodium chloride flush  3 mL Intravenous Once   Continuous Infusions:  PRN Meds:   Allergies:   Allergies  Allergen Reactions  . Altace [Ramipril] Swelling and Other (See Comments)    Mouth swelling  . Mucinex [Guaifenesin Er] Hives, Swelling and Other (See Comments)    Mouth swelling  . Contrast Media [Iodinated Diagnostic Agents] Other (See Comments)    Made eyes change each time    Social History:   Social History   Socioeconomic History  . Marital status: Married    Spouse name: Not on file  . Number of children: 2  . Years of education: Not on file  . Highest education level: Not on file  Occupational History  . Not on file  Tobacco Use  . Smoking status: Never Smoker  . Smokeless tobacco: Never Used  Vaping Use  . Vaping Use: Never used  Substance and Sexual Activity  . Alcohol use: No    Alcohol/week:  0.0 standard drinks  . Drug use: No  . Sexual activity: Not Currently  Other Topics Concern  . Not on file  Social History Narrative   Lives at home in [place] with wife.  Retired from [employer].     Social Determinants of Health   Financial Resource Strain:   . Difficulty of Paying Living Expenses:   Food Insecurity:   . Worried About Programme researcher, broadcasting/film/video in the Last Year:   . Barista in the Last Year:   Transportation Needs:   . Freight forwarder (Medical):   [name] Lack of Transportation (Non-Medical):   Physical Activity:   . Days of Exercise per Week:   . Minutes of Exercise per Session:   Stress:   . Feeling of Stress :   Social Connections:   . Frequency of Communication with Friends and Family:   . Frequency of Social Gatherings with Friends and Family:   . Attends Religious Services:   . Active Member of Clubs or Organizations:   . Attends Banker Meetings:   [name] Marital Status:   Intimate Partner Violence:   . Fear of Current or Ex-Partner:   . Emotionally Abused:   [name] Physically Abused:   . Sexually Abused:      Family History:   Family History  Problem Relation Age of Onset  . Cancer Mother   . Heart disease Father        died age 2  . Stroke Maternal Grandmother   . Cancer Maternal Grandfather   .  Allergic rhinitis Neg Hx   . Angioedema Neg Hx   . Asthma Neg Hx   . Atopy Neg Hx   . Eczema Neg Hx   . Immunodeficiency Neg Hx   . Urticaria Neg Hx     ROS:  Review of Systems  Constitutional: Positive for malaise/fatigue. Negative for chills, diaphoresis, fever and weight loss.  HENT: Negative for congestion.   Eyes: Negative for discharge and redness.  Respiratory: Negative for cough, hemoptysis, sputum production, shortness of breath and wheezing.   Cardiovascular: Negative for chest pain, palpitations, orthopnea, claudication, leg swelling and PND.  Gastrointestinal: Negative for abdominal pain, blood in  stool, heartburn, melena, nausea and vomiting.  Genitourinary: Negative for hematuria.  Musculoskeletal: Positive for falls. Negative for myalgias.  Skin: Negative for rash.  Neurological: Positive for dizziness, loss of consciousness, weakness and headaches. Negative for tingling, tremors, sensory change, speech change, focal weakness and seizures.  Endo/Heme/Allergies: Does not bruise/bleed easily.  Psychiatric/Behavioral: Negative for substance abuse. The patient is not nervous/anxious.   All other systems reviewed and are negative.     Physical Exam/Data:   Vitals:   11/20/19 0321 11/20/19 0321 11/20/19 0707 11/20/19 0730  BP:  128/67 (!) 153/78 (!) 162/79  Pulse: (!) 58 (!) 49 61 (!) 31  Resp: (!) 34 (!) 24 19 20   Temp:    98.7 F (37.1 C)  TempSrc:      SpO2: 92% 92% 94% 93%   No intake or output data in the 24 hours ending 11/20/19 0755 There were no vitals filed for this visit. There is no height or weight on file to calculate BMI.   Physical Exam: General: Well developed, well nourished, in no acute distress. Head: Normocephalic, atraumatic, sclera non-icteric, no xanthomas, nares without discharge.  Neck: Negative for carotid bruits. JVD not elevated. Lungs: Coarse breath sounds bilaterally with scattered wheezing. Breathing is unlabored. Heart: Bradycardic and irregularly irregular with S1 S2. II/VI systolic murmur RUSB, no rubs, or gallops appreciated. Abdomen: Soft, non-tender, non-distended with normoactive bowel sounds. No hepatomegaly. No rebound/guarding. No obvious abdominal masses. Msk:  Strength and tone appear normal for age. Extremities: No clubbing or cyanosis.  Trace bilateral pretibial edema. Distal pedal pulses are 2+ and equal bilaterally. Neuro: Alert and oriented X 3. No facial asymmetry. No focal deficit. Moves all extremities spontaneously. Psych:  Responds to questions appropriately with a normal affect.   EKG:  The EKG was personally reviewed  and demonstrates: A. fib, 52 bpm, nonspecific IVCD and LBBB pattern Telemetry:  Telemetry was personally reviewed and demonstrates: A. fib with slow ventricular response with longest R-R interval 3.7 seconds on telemetry  Weights: There were no vitals filed for this visit.  Relevant CV Studies:  [facility] 10/18/2019:  Ost RCA to Dist RCA lesion is 100% stenosed.  Origin lesion is 100% stenosed.  Ost Cx to Prox Cx lesion is 100% stenosed.  Prox LAD to Mid LAD lesion is 100% stenosed with 100% stenosed side branch in 1st Diag.  Dist Graft lesion is 70% stenosed.  Prox Graft to Mid Graft lesion is 40% stenosed.  Previously placed Origin to Prox Graft stent (unknown type) is widely patent.   Severe native coronary restrictive disease with total occlusion of the proximal LAD prior to any diagonal vessel takeoff; total occlusion of the ostium of the left circumflex coronary artery and total occlusion of the ostium of the native RCA.  Patent LIMA graft supplying the mid LAD.  Patent large SVG supplying the  OM 2 vessel with filling of the circumflex to the OM1 vessel with 70% mid AV groove stenosis and evidence for collateralization to the distal RCA.  Patent stent in the proximal SVG supplying the diagonal vessel with mid graft narrowing of 40% and focal 70% distal graft stenosis.  Old occluded graft which had supplied the distal RCA.  Severe aortic stenosis with a mean transvalvular aortic gradient at 41 mm.\  Mild right heart pressure elevation.  RECOMMENDATION: I reviewed the angiographic findings with Dr. Excell Seltzerooper.  Will refer for TAVR consultation.  Medical therapy for his significant CAD.  Patient will resume Eliquis tomorrow.  __________  Limited echo 11/14/2019: 1. Periprocedural limited TTE during a TAVR procedure. A 26 mm  Edwards-SAPIEN 3 Ultra valve was successfully deployed in the aortic  position with improvement of transaortic gradients from peak/mean 59/40  mmHg to  7/3 mmHg. No paravalvular leak was seen.  No pericardial effusion prior or post procedure. LVEF has slightly  improved post procedure from 45-50% to 50-55%.  2. Left ventricular ejection fraction, by estimation, is 50 to 55%. The  left ventricle has low normal function. There is mild concentric left  ventricular hypertrophy. Left ventricular diastolic function could not be  evaluated.  3. Right ventricular systolic function is normal. The right ventricular  size is normal.  4. Left atrial size was mildly dilated.  5. The mitral valve is degenerative. Mild mitral valve regurgitation. No  evidence of mitral stenosis.  6. The aortic valve is tricuspid. Aortic valve regurgitation is not  visualized. Severe aortic valve stenosis. Aortic valve mean gradient  measures 40.0 mmHg. __________  Limited echo 11/15/2019: 1. Day 1 post TAVR, no paravalvular leak. Normal transaortic gradients  peak/mean 17/10 mmHg. LVEF improved to 55-60%.  2. Left ventricular ejection fraction, by estimation, is 55 to 60%. The  left ventricle has normal function. There is moderate concentric left  ventricular hypertrophy.  3. Trivial mitral valve regurgitation.  4. The aortic valve has been repaired/replaced. There is a 26 mm Ultra,  stented (TAVR) valve present in the aortic position. Procedure Date:  11/14/2019. Echo findings are consistent with normal structure and  function of the aortic valve prosthesis.  Aortic valve mean gradient measures 10.0 mmHg.  5. There is normal pulmonary artery systolic pressure. The estimated  right ventricular systolic pressure is 29.6 mmHg.   Laboratory Data:  Chemistry Recent Labs  Lab 11/14/19 1126 11/15/19 0426 11/19/19 2013  NA 143 141 139  K 4.2 3.9 3.9  CL 108 112* 109  CO2  --  20* 21*  GLUCOSE 145* 110* 117*  BUN 17 19 22   CREATININE 1.00 1.17 1.39*  CALCIUM  --  8.4* 8.6*  GFRNONAA  --  58* 47*  GFRAA  --  >60 55*  ANIONGAP  --  9 9    No  results for input(s): PROT, ALBUMIN, AST, ALT, ALKPHOS, BILITOT in the last 168 hours. Hematology Recent Labs  Lab 11/14/19 1126 11/15/19 0426 11/19/19 2013  WBC  --  12.9* 11.4*  RBC  --  3.32* 3.43*  HGB 10.9* 10.3* 10.7*  HCT 32.0* 32.6* 32.7*  MCV  --  98.2 95.3  MCH  --  31.0 31.2  MCHC  --  31.6 32.7  RDW  --  13.2 13.2  PLT  --  130* 101*   Cardiac EnzymesNo results for input(s): TROPONINI in the last 168 hours. No results for input(s): TROPIPOC in the last 168 hours.  BNPNo results  for input(s): BNP, PROBNP in the last 168 hours.  DDimer No results for input(s): DDIMER in the last 168 hours.  Radiology/Studies:  CT HEAD WO CONTRAST  Result Date: 11/19/2019 IMPRESSION: 1. No acute intracranial pathology. 2. Mild age-related atrophy and chronic microvascular ischemic changes. 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 Electronically Signed   By: Elgie Collard M.D.   On: 11/19/2019 20:57    Assessment and Plan:   1.  Syncope with sinus pauses/high-grade conduction disease: -Patient admitted with syncope with documented 8 to 11-second sinus pause episodes in the setting of recent TAVR with prior documented RBBB with LAFB now with nonspecific IVCD and LBBB pattern -Transfer to [facility] for further evaluation and management by EP -Avoid AV nodal blocking agents -Patient to have 2 peripheral IVs in the right upper extremity, 1 in the left upper extremity -Start IV fluids at 50 mL/hr  2.  CAD status post CABG with elevated troponin: -No symptoms of chest pain -Initial high-sensitivity troponin 39 with delta pending -Recent LHC 10/2019 with medical management as outlined above -ASA -Ranexa, continue for now per MD -Imdur  3.  PAF: -He remains in A. fib with controlled ventricular response with ventricular rates ranging from the 50s to 60s -Not on AV nodal blocking medications as outlined above -CHA2DS2-VASc 7 (CHF, HTN, age x 2, vascular disease, CVA x  2) -Resume Eliquis when able post procedure -Hold amiodarone after discussion with MD  4.  HFpEF: -Diuresis as indicated in the periprocedural timeframe  5.  Aortic stenosis status post TAVR: -Management per structural heart team  6.  HLD: -Atorvastatin -Ezetimibe  7.  HTN: -Avoid AV nodal blocking medications -Hydralazine as needed  8.  Hypothyroidism: -TSH pending -PTA levothyroxine  9.  GERD: -Pantoprazole   For questions or updates, please contact [facility] Please consult [email or web address] for contact info under Cardiology/STEMI.   Signed, Eula Listen, PA-C [facility] Pager: [phone] 11/20/2019, 7:55 AM

## 2019-11-20 NOTE — Discharge Summary (Signed)
DISCHARGE SUMMARY    Patient ID: Jorge Cherry,  MRN: [id number], DOB/AGE: 1935/07/10 84 y.o.  Admit date: 11/20/2019 Discharge date: 11/21/2019  Primary Care Physician: Chilton Greathouse, MD  Primary Cardiologist: Dr. Tresa Endo Structural Heart: Dr. Dr. Clifton James Electrophysiologist: new, Dr. Graciela Husbands  Primary Discharge Diagnosis:  1. CHB 2. Symptomatic bradycardia  Secondary Discharge Diagnosis:  1. CAD     H/o remote CABG >> remote PCI     Medical management s/p cath 10/18/19 2. VHD     S/p TAVR 11/14/2019 3. Persistent AFib     CHA2DS2Vasc is 4, on Eliquis 4. HTN  Allergies  Allergen Reactions  . Altace [Ramipril] Swelling and Other (See Comments)    Mouth swelling  . Mucinex [Guaifenesin Er] Hives, Swelling and Other (See Comments)    Mouth swelling  . Contrast Media [Iodinated Diagnostic Agents] Other (See Comments)    Made eyes change each time     Procedures This Admission:  1.  Implantation of a MDT dual chamber PPM on 11/20/2019 by Dr Graciela Husbands.  The patient received a  Medtronic MRI compatible  pulse generator serial number [id number], Medtronic MRI compatible 5076 ventricular lead serial number [id number] and a  Medtronic MRI compatible 5076 atrial lead serial number [id number] . There were no immediate post procedure complications. 2.  CXR on 11/21/2019 demonstrated no pneumothorax status post device implantation.   Brief HPI: Jorge Cherry is a 84 y.o. male with PMHx as noted above, post TAVR noting baseline RBBB, LAD >> Afib w/LBBB was placed post procedure on Zio patch. He subsequently suffered 2 syncopal events.  He was brought via EMS after the last to [facility] Review of his Zio patch showedmultiple pauses lasting from 8 to 11 seconds in duration. CT head showed no acute intracranial pathology. Old right occipital infarct was noted.  Telemetry as demonstrated A. fib with slow ventricular response with bundle branch block with longest R-R interval being 3.7 seconds.  he was on amiodarone for AFib.  . Labs largely unremarkable, HS trop 39 and 46,  no CP, not felt ACS.  Case was discussed with the cardiology fellow at [facility] overnight who accepted the patient and transferred to [facility] for EP evaluation and likely [facility]    Hospital Course:  The patient was transferred to [facility], seen in consult by Dr. Graciela Husbands.  EMS EKG reviewed, is bradycardic, regular, no discernable P waves, otherwise has been seen in Afib, suspect his EMS EKG is AFib w/CHB, interestingly back in RBBB. Given his baseline conductions sytem disease, and alternating BBB post TAVR, PPM was recommended. admitted and underwent implantation of a PPM with details as outlined above. He was monitored on telemetry overnight which demonstrated AFib intermittent V pacing (brady rate set at 50bpm).  Left chest was without hematoma or ecchymosis.  The device was interrogated and found to be functioning normally.  CXR was obtained and demonstrated no pneumothorax status post device implantation.  Wound care, arm mobility, and restrictions were reviewed with the patient.  The patient feels generally OK, though in AFib with reduced energy and exertional capacity, no CP or SOB, he was examined by Dr. Graciela Husbands planned for IV lasix this morning and considered stable for discharge to home.   Dr. Graciela Husbands would like his to see the AFib clinic next week, once back on his Eliquis, feels his AFib contributes to reduced energy, with thoughts towards TEE/DCCV if he is in Afib at his visit next week and back on  his Eliquis  Will resume eliquis Friday 11/24/19 He qualifies for 5mg  dosing, looks like this has been discussed and at the time  Of hs TAVR and with the additionof ASA to his regime, left at the lower dose.   We will increase his eliquis to 5mg  BID (to start Friday) the patient and his wife are aware   Physical Exam: Vitals:   11/21/19 0546 11/21/19 0800 11/21/19 1033 11/21/19 1149  BP: (!) 159/78 (!) 145/90 (!) 141/72 133/74    Pulse: 62 (!) 53 (!) 59 (!) 54  Resp: 16  16 18   Temp: 98.2 F (36.8 C) 98.4 F (36.9 C) 98.2 F (36.8 C) 98.1 F (36.7 C)  TempSrc: Oral Oral Oral Oral  SpO2: 97% (!) 75% 96% 94%  Weight: 86.5 kg     Height:        GEN- The patient is well appearing, alert and oriented x 3 today.   HEENT: normocephalic, atraumatic; sclera clear, conjunctiva pink; hearing intact; oropharynx clear; neck supple, no JVP Lungs- CTA b/l, normal work of breathing.  No wheezes, rales, rhonchi Heart- irreg-irreg, no murmurs, rubs or gallops, PMI not laterally displaced GI- soft, non-tender, non-distended Extremities- no clubbing, cyanosis, or edema MS- no significant deformity or atrophy Skin- warm and dry, no rash or lesion,  left chest without hematoma/ecchymosis Psych- euthymic mood, full affect Neuro- no gross deficits   Labs:   Lab Results  Component Value Date   WBC 11.4 (H) 11/19/2019   HGB 10.7 (L) 11/19/2019   HCT 32.7 (L) 11/19/2019   MCV 95.3 11/19/2019   PLT 101 (L) 11/19/2019    Recent Labs  Lab 11/21/19 0449  NA 143  K 3.9  CL 110  CO2 21*  BUN 22  CREATININE 1.20  CALCIUM 8.8*  GLUCOSE 138*    Discharge Medications:  Allergies as of 11/21/2019      Reactions   Altace [ramipril] Swelling, Other (See Comments)   Mouth swelling   Mucinex [guaifenesin Er] Hives, Swelling, Other (See Comments)   Mouth swelling   Contrast Media [iodinated Diagnostic Agents] Other (See Comments)   Made eyes change each time      Medication List    TAKE these medications   albuterol 108 (90 Base) MCG/ACT inhaler Commonly known as: VENTOLIN HFA INHALE TWO PUFFS EVERY 4-6 HOURS AS NEEDED FOR COUGH OR WHEEZE What changed: See the new instructions.   amiodarone 200 MG tablet Commonly known as: PACERONE Take 1 tablet (200 mg total) by mouth daily.   amLODipine 2.5 MG tablet Commonly known as: NORVASC Take 1 tablet (2.5 mg total) by mouth 2 (two) times daily. NEED OV.   apixaban 5  MG Tabs tablet Commonly known as: ELIQUIS Take 1 tablet (5 mg total) by mouth 2 (two) times daily. What changed:   medication strength  how much to take Notes to patient: Do not resume until Friday 11/24/2019   aspirin 81 MG chewable tablet Chew 1 tablet (81 mg total) by mouth daily.   atorvastatin 20 MG tablet Commonly known as: LIPITOR Take 1 tablet (20 mg total) by mouth at bedtime.   Centrum Silver 50+Men Tabs Take 1 tablet by mouth at bedtime.   docusate sodium 100 MG capsule Commonly known as: COLACE Take 100 mg by mouth daily.   ezetimibe 10 MG tablet Commonly known as: ZETIA TAKE 1 TABLET BY MOUTH EVERY DAY What changed:   how much to take  how to take this  when to  take this   fenofibrate 145 MG tablet Commonly known as: TRICOR Take 0.5 tablets (72.5 mg total) by mouth daily.   Fish Oil 1000 MG Caps Take 1,000 mg by mouth 2 (two) times daily.   fluticasone 110 MCG/ACT inhaler Commonly known as: FLOVENT HFA Inhale 1 puff into the lungs 3 (three) times daily as needed (for seasonal flares).   fluticasone 50 MCG/ACT nasal spray Commonly known as: FLONASE Place 2 sprays into both nostrils daily as needed for allergies or rhinitis.   ipratropium 0.06 % nasal spray Commonly known as: ATROVENT Can use two sprays in each nostril every six hours as needed to dry up nose.   isosorbide mononitrate 60 MG 24 hr tablet Commonly known as: IMDUR TAKE 1 AND 1/2 TABS (90MG ) BY MOUTH EVERY MORNING AND 1/2 TAB EVERY EVENING What changed:   how much to take  how to take this  when to take this  additional instructions   levothyroxine 25 MCG tablet Commonly known as: SYNTHROID Take 25 mcg by mouth daily before breakfast.   loratadine 10 MG tablet Commonly known as: CLARITIN Take 10 mg by mouth every evening.   nitroGLYCERIN 0.4 MG/SPRAY spray Commonly known as: NITROLINGUAL PLACE 1 SPRAY UNDER THE TONGUE EVERY 5 (FIVE) MINUTES X 3 DOSES AS NEEDED FOR  CHEST PAIN.   pantoprazole 40 MG tablet Commonly known as: PROTONIX TAKE 1 TABLET BY MOUTH EVERY DAY   ranolazine 500 MG 12 hr tablet Commonly known as: RANEXA TAKE 1 TABLET BY MOUTH TWICE A DAY   vitamin C 500 MG tablet Commonly known as: ASCORBIC ACID Take 500 mg by mouth at bedtime.   vitamin E 180 MG (400 UNITS) capsule Take 400 Units by mouth daily.   zinc gluconate 50 MG tablet Take 50 mg by mouth 2 (two) times a week.            Discharge Care Instructions  (From admission, onward)         Start     Ordered   11/21/19 0000  Discharge wound care:       Comments: As noted in the discharge instructions   11/21/19 1217          Disposition: Home Discharge Instructions    Diet - low sodium heart healthy   Complete by: As directed    Discharge wound care:   Complete by: As directed    As noted in the discharge instructions   Increase activity slowly   Complete by: As directed       Follow-up Information    [facility] Office Follow up.   Specialty: Cardiology Why: 12/05/2019 @ 2:30PM, wound check visit Contact information: [address], [address] [place] [phone]       [phone], MD Follow up.   Specialty: Cardiology Why: 03/06/2020 @ 1:45PM Contact information: [address] [place] [phone]        [facility] Follow up.   Specialty: Cardiology Why: 11/29/2019 @ 1:30PM with 12/01/2019, PA Contact information: [address] [id number] [place] [phone]              Duration of Discharge Encounter: Greater than 30 minutes including physician time.  [phone], PA-C 11/21/2019 12:18 PM

## 2019-11-20 NOTE — ED Notes (Signed)
Dentures found in a cup with water when cleaning room. Spoke with daughter and wife and they will pick up.  Left with pt label at first nurse desk.

## 2019-11-20 NOTE — ED Notes (Signed)
[facility] transporting [facility] to [facility]. VSS at this time.

## 2019-11-20 NOTE — ED Notes (Signed)
Cardiology PA at bedside. 

## 2019-11-20 NOTE — Consult Note (Signed)
ELECTROPHYSIOLOGY CONSULT NOTE  Patient ID: Jorge Cherry, MRN: [id number], DOB/AGE: 82-Mar-1937 82 y.o. Admit date: 11/20/2019 Date of Consult: 11/20/2019  Primary Physician: Jorge Greathouse, MD Primary Cardiologist: Jorge Cherry is a 84 y.o. male who is being seen today for the evaluation of synope at the request of Dr DM   Chief Complaint:   HPI Jorge Cherry is a 84 y.o. male  Transferred for syncope with documented pauses in afib condution.  Had pre TAVR RBBB and sinus and post TAVR LBBB and  Now afib  ischmeic cardiomyopathy with prior CABG prog exercise intolerance class 3 b symptoms < 100 ft ; no PND or orthopnea,  Some edema  Remote syncope   LHC 6/21 patent grafts, occluded natives, EF by Echo 55-60-%  CT head>> old occipital infarct  Thromboembolic risk factors ( age  -2, HTN-1, TIA/CVA-2, Vasc disease -1) for a CHADSVASc Score of >6  Date Cr K Hgb  7/21/  1.39 3.9 11.4            Past Medical History:  Diagnosis Date  . Arthritis    "just a touch in my hands" (11/24/2016)  . Asthma   . Atherosclerosis of renal artery (HCC)    RENAL DOPPLER, 12/10/2011 - Left renal artery demonstrated narrowing with elevated velocities consistent with a 1-59% diameter reduction  . CKD (chronic kidney disease) stage 3, GFR 30-59 ml/min    "stable now since they backed off the water pills" (11/24/2016)  . Coronary artery disease    a. 1994 s/p CABG x 4 (LIMA-LAD, VG->D2, VG->OM, VG->RCA); b. 02/2004 PCI SVG-D2 (3.5x16 Taxus DES). VG->RCA 100. Sev apical LAD dzs distal to LIMA insertion; c. 11/2016 Cath: LAD 95p/186m, D2 100ost, LCX 100ost, 70p/m, RCA 100p/m, RPDA fills via L->R collats. VG->RCA 100, VG->D2 20 ost, patent prox stent, 22m, LIMA->LAD ok, VG->OM3 20p. EF 55%-->Med Rx.  . GERD (gastroesophageal reflux disease)   . High cholesterol   . History of lower GI bleeding    a. 09/2014 GIB due to diverticulosis/diverticulitis.  . Iron deficiency anemia   . Labile  Hypertension   . Moderate aortic stenosis    a. 10/2011 Echo:  EF >55%, mild-mod TR, mild-mod AS, mod Ca2+ of AoV leaflets; b. 07/2016 Echo: EF 60-65%, no rwma, Gr1 DD, mod AS [(S) mean grad , peak grad . Valve area (VTI): 1.33cm^2, (Vmax) 1.44cm^2. Mild MR]; c. 02/2018 Echo: EF 55-60%, no rwma, GR1 DD, Mod AS [Peak Vel (S): 319cm/s, Mean grad (S) , peak grad (S) 23mmHg].  [name] PAF (paroxysmal atrial fibrillation) (HCC)    a. CHA2DS2VASc = 4-->Eliquis.  . S/P TAVR (transcatheter aortic valve replacement) 11/14/2019   s/p TAVR with a 26 mm Edwards S3U via the TF approach by Drs Clifton James & Bartle  . Sleep apnea       Surgical History:  Past Surgical History:  Procedure Laterality Date  . CARDIAC CATHETERIZATION  02/27/2004   Coronary intervention and medical management  . CARDIAC CATHETERIZATION  11/24/2016  . CATARACT EXTRACTION W/ INTRAOCULAR LENS IMPLANT Left   . CORONARY ANGIOPLASTY  1994 X 2   "before bypass surgery"  . CORONARY ANGIOPLASTY WITH STENT PLACEMENT  03/04/2004   SVG supplying the diagonal vessel stented with a 3.5x35mm Taxus stent post dilated to 4.0 mm  . CORONARY ARTERY BYPASS GRAFT  1994   "CABG X4"  . INGUINAL HERNIA REPAIR Right   . RIGHT HEART CATH AND CORONARY/GRAFT ANGIOGRAPHY N/A 11/24/2016  Procedure: Right Heart Cath and Coronary/Graft Angiography;  Surgeon: Lennette BihariKelly, Thomas A, MD;  [facility];  Service: Cardiovascular;  Laterality: N/A;  . RIGHT/LEFT HEART CATH AND CORONARY/GRAFT ANGIOGRAPHY N/A 10/18/2019   Procedure: RIGHT/LEFT HEART CATH AND CORONARY/GRAFT ANGIOGRAPHY;  Surgeon: Lennette BihariKelly, Thomas A, MD;  Location: [facility] INVASIVE CV LAB;  Service: Cardiovascular;  Laterality: N/A;  . TEE WITHOUT CARDIOVERSION N/A 11/14/2019   Procedure: TRANSESOPHAGEAL ECHOCARDIOGRAM (TEE);  Surgeon: Kathleene HazelMcAlhany, Christopher D, MD;  [facility];  Service: Open Heart Surgery;  Laterality: N/A;  . TONSILLECTOMY AND ADENOIDECTOMY    .  TRANSCATHETER AORTIC VALVE REPLACEMENT, TRANSFEMORAL N/A 11/14/2019   Procedure: TRANSCATHETER AORTIC VALVE REPLACEMENT, TRANSFEMORAL;  Surgeon: Kathleene HazelMcAlhany, Christopher D, MD;  Location: [facility] INVASIVE CV LAB;  Service: Open Heart Surgery;  Laterality: N/A;     Home Meds: Prior to Admission medications   Medication Sig Start Date End Date Taking? Authorizing Provider  albuterol (VENTOLIN HFA) 108 (90 Base) MCG/ACT inhaler INHALE TWO PUFFS EVERY 4-6 HOURS AS NEEDED FOR COUGH OR WHEEZE Patient taking differently: Inhale 2 puffs into the lungs every 4 (four) hours as needed for wheezing or shortness of breath.  10/30/19   Kozlow, Alvira PhilipsEric J, MD  amiodarone (PACERONE) 200 MG tablet Take 1 tablet (200 mg total) by mouth daily. 09/21/19   Lennette BihariKelly, Thomas A, MD  amLODipine (NORVASC) 2.5 MG tablet Take 1 tablet (2.5 mg total) by mouth 2 (two) times daily. NEED OV. 06/12/19   Lennette BihariKelly, Thomas A, MD  aspirin 81 MG chewable tablet Chew 1 tablet (81 mg total) by mouth daily. 11/16/19   Janetta Horahompson, Kathryn R, PA-C  atorvastatin (LIPITOR) 20 MG tablet Take 1 tablet (20 mg total) by mouth at bedtime. 03/16/18   Barnetta Chapelgbata, Sylvester I, MD  Azelastine-Fluticasone (DYMISTA) 137-50 MCG/ACT SUSP Place 1-2 sprays into the nose daily as needed (for seasonal allergies).    [provider]  docusate sodium (COLACE) 100 MG capsule Take 100 mg by mouth daily.     [provider]  ELIQUIS 2.5 MG TABS tablet TAKE 1 TABLET BY MOUTH TWICE A DAY 09/12/19   Lennette BihariKelly, Thomas A, MD  ezetimibe (ZETIA) 10 MG tablet TAKE 1 TABLET BY MOUTH EVERY DAY Patient taking differently: Take 10 mg by mouth daily. TAKE 1 TABLET BY MOUTH EVERY DAY 04/25/19   Lennette BihariKelly, Thomas A, MD  fenofibrate (TRICOR) 145 MG tablet Take 0.5 tablets (72.5 mg total) by mouth daily. 02/01/19   Lennette BihariKelly, Thomas A, MD  fluticasone (FLONASE) 50 MCG/ACT nasal spray Place 2 sprays into both nostrils daily.     [provider]  fluticasone (FLOVENT HFA) 110 MCG/ACT inhaler Inhale 1  puff into the lungs 3 (three) times daily as needed (for seasonal flares).     [provider]  ipratropium (ATROVENT) 0.06 % nasal spray Can use two sprays in each nostril every six hours as needed to dry up nose. Patient taking differently: Place 2 sprays into both nostrils 3 (three) times daily.  02/28/19   Kozlow, Alvira PhilipsEric J, MD  isosorbide mononitrate (IMDUR) 60 MG 24 hr tablet TAKE 1 AND 1/2 TABS (90MG ) BY MOUTH EVERY MORNING AND 1/2 TAB EVERY EVENING Patient taking differently: Take 30-90 mg by mouth See admin instructions. TAKE 1 AND 1/2 TABS (90MG ) BY MOUTH EVERY MORNING AND 1/2 TAB EVERY EVENING 09/07/19   Lennette BihariKelly, Thomas A, MD  levothyroxine (SYNTHROID) 25 MCG tablet Take 25 mcg by mouth daily before breakfast.  07/08/19   [provider]  loratadine (CLARITIN) 10 MG tablet Take 10 mg by mouth every evening.     [provider]  Multiple Vitamins-Minerals (CENTRUM SILVER 50+MEN) TABS Take 1 tablet by mouth at bedtime.    [provider]  nitroGLYCERIN (NITROLINGUAL) 0.4 MG/SPRAY spray PLACE 1 SPRAY UNDER THE TONGUE EVERY 5 (FIVE) MINUTES X 3 DOSES AS NEEDED FOR CHEST PAIN. 11/17/19   Kathleene Hazel, MD  Omega-3 Fatty Acids (FISH OIL) 1000 MG CAPS Take 1,000 mg by mouth 2 (two) times daily.     [provider]  pantoprazole (PROTONIX) 40 MG tablet TAKE 1 TABLET BY MOUTH EVERY DAY Patient taking differently: Take 40 mg by mouth daily.  10/02/19   Kozlow, Alvira Philips, MD  ranolazine (RANEXA) 500 MG 12 hr tablet TAKE 1 TABLET BY MOUTH TWICE A DAY Patient taking differently: Take 500 mg by mouth 2 (two) times daily.  09/19/19   Lennette Bihari, MD  vitamin C (ASCORBIC ACID) 500 MG tablet Take 500 mg by mouth at bedtime.     [provider]  vitamin E 400 UNIT capsule Take 400 Units by mouth daily.    [provider]  zinc gluconate 50 MG tablet Take 50 mg by mouth 2 (two) times a week.    [provider]    Inpatient  Medications:     Allergies:  Allergies  Allergen Reactions  . Altace [Ramipril] Swelling and Other (See Comments)    Mouth swelling  . Mucinex [Guaifenesin Er] Hives, Swelling and Other (See Comments)    Mouth swelling  . Contrast Media [Iodinated Diagnostic Agents] Other (See Comments)    Made eyes change each time    Social History   Socioeconomic History  . Marital status: Married    Spouse name: Not on file  . Number of children: 2  . Years of education: Not on file  . Highest education level: Not on file  Occupational History  . Not on file  Tobacco Use  . Smoking status: Never Smoker  . Smokeless tobacco: Never Used  Vaping Use  . Vaping Use: Never used  Substance and Sexual Activity  . Alcohol use: No    Alcohol/week: 0.0 standard drinks  . Drug use: No  . Sexual activity: Not Currently  Other Topics Concern  . Not on file  Social History Narrative   Lives at home in [place] with wife.  Retired from [employer].     Social Determinants of Health   Financial Resource Strain:   . Difficulty of Paying Living Expenses:   Food Insecurity:   . Worried About Programme researcher, broadcasting/film/video in the Last Year:   . Barista in the Last Year:   Transportation Needs:   . Freight forwarder (Medical):   [name] Lack of Transportation (Non-Medical):   Physical Activity:   . Days of Exercise per Week:   . Minutes of Exercise per Session:   Stress:   . Feeling of Stress :   Social Connections:   . Frequency of Communication with Friends and Family:   . Frequency of Social Gatherings with Friends and Family:   . Attends Religious Services:   . Active Member of Clubs or Organizations:   . Attends Banker Meetings:   [name] Marital Status:   Intimate Partner Violence:   . Fear of Current or Ex-Partner:   . Emotionally Abused:   [name] Physically Abused:   . Sexually Abused:  Family History  Problem Relation Age of Onset  . Cancer Mother   .  Heart disease Father        died age 35  . Stroke Maternal Grandmother   . Cancer Maternal Grandfather   . Allergic rhinitis Neg Hx   . Angioedema Neg Hx   . Asthma Neg Hx   . Atopy Neg Hx   . Eczema Neg Hx   . Immunodeficiency Neg Hx   . Urticaria Neg Hx      ROS:  Please see the history of present illness.     All other systems reviewed and negative.    Physical Exam:  VS  HR 65 and irregular BP sys 214  General: Well developed, well nourished male in no acute distress. Head: Normocephalic, atraumatic, sclera non-icteric, no xanthomas, nares are without discharge. EENT: normal Lymph Nodes:  none Back: without scoliosis/kyphosis , no CVA tendersness Neck: Negative for carotid bruits. JVD 8-10 Lungs: Clear bilaterally to auscultation without wheezes, rales, or rhonchi. Breathing is unlabored. Heart: IRREGULARLY IRREGULAR  2/6 systolic murmur, varies with RR rubs, or gallops appreciated. Abdomen: Soft, non-tender, non-distended with normoactive bowel sounds. No hepatomegaly. No rebound/guarding. No obvious abdominal masses. Msk:  Strength and tone appear normal for age. Extremities: No clubbing or cyanosis. 2+ edema.  Distal pedal pulses are 2+ and equal bilaterally. Skin: Warm and Dry Neuro: Alert and oriented X 3. CN III-XII intact Grossly normal sensory and motor function . Psych:  Responds to questions appropriately with a normal affect.      Labs: Cardiac Enzymes No results for input(s): CKTOTAL, CKMB, TROPONINI in the last 72 hours. CBC Lab Results  Component Value Date   WBC 11.4 (H) 11/19/2019   HGB 10.7 (L) 11/19/2019   HCT 32.7 (L) 11/19/2019   MCV 95.3 11/19/2019   PLT 101 (L) 11/19/2019   PROTIME: No results for input(s): LABPROT, INR in the last 72 hours. Chemistry  Recent Labs  Lab 11/19/19 2013  NA 139  K 3.9  CL 109  CO2 21*  BUN 22  CREATININE 1.39*  CALCIUM 8.6*  GLUCOSE 117*   Lipids Lab Results  Component Value Date   CHOL 106  07/01/2016   HDL 35 (L) 07/01/2016   LDLCALC 47 07/01/2016   TRIG 120 07/01/2016   BNP No results found for: PROBNP Thyroid Function Tests: Recent Labs    11/20/19 0742  TSH 1.744      Miscellaneous No results found for: DDIMER  Radiology/Studies:  DG Chest 2 View  Result Date: 11/11/2019 CLINICAL DATA:  Preoperative radiograph, TAVR scheduled 11/14/2019 EXAM: CHEST - 2 VIEW COMPARISON:  CT chest 11/06/2019 FINDINGS: Few septal lines in the periphery of the lung bases with central vascular congestion could reflect some mild interstitial edema. No consolidation, pneumothorax, or effusion. Postsurgical changes related to prior CABG including intact and aligned sternotomy wires and multiple surgical clips projecting over the mediastinum. The aorta is calcified. The remaining cardiomediastinal contours are unremarkable. No acute osseous or soft tissue abnormality. Stable compression deformities at L1 and L2. Degenerative changes are present in the imaged spine and shoulders. IMPRESSION: Mild interstitial edema. No other acute cardiopulmonary abnormality. Prior CABG. Unchanged L1, L2 compression deformities. Aortic Atherosclerosis (ICD10-I70.0). Electronically Signed   By: Kreg Shropshire M.D.   On: 11/11/2019 00:14   CT HEAD WO CONTRAST  Result Date: 11/19/2019 CLINICAL DATA:  84 year old male with head trauma. EXAM: CT HEAD WITHOUT CONTRAST TECHNIQUE: Contiguous axial images were obtained from the  base of the skull through the vertex without intravenous contrast. COMPARISON:  Head CT dated 06/13/2009. FINDINGS: Brain: Mild age-related atrophy and chronic microvascular ischemic changes. There is a focal area of old infarct and encephalomalacia in the right occipital lobe. There is no acute intracranial hemorrhage. No mass effect or midline shift. No extra-axial fluid collection. Vascular: No hyperdense vessel or unexpected calcification. Skull: Normal. Negative for fracture or focal lesion.  Sinuses/Orbits: No acute finding. Other: None IMPRESSION: 1. No acute intracranial pathology. 2. Mild age-related atrophy and chronic microvascular ischemic changes. 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 Electronically Signed   By: Elgie Collard M.D.   On: 11/19/2019 20:57   CT CORONARY MORPH W/CTA COR W/SCORE W/CA W/CM &/OR WO/CM  Addendum Date: 11/07/2019   ADDENDUM REPORT: 11/07/2019 07:39 CLINICAL DATA:  84 year old male with severe aortic stenosis being evaluated for a TAVR procedure. EXAM: Cardiac TAVR CT TECHNIQUE: The patient was scanned on a [employer]. A 120 kV retrospective scan was triggered in the descending thoracic aorta at 111 HU's. Gantry rotation speed was 250 msecs and collimation was .6 mm. No beta blockade or nitro were given. The 3D data set was reconstructed in 5% intervals of the R-R cycle. Systolic and diastolic phases were analyzed on a dedicated work station using MPR, MIP and VRT modes. The patient received 80 cc of contrast. FINDINGS: Aortic Valve: Trileaflet aortic valve with severely calcified leaflet with severe motion restriction and only minimal calcifications extending into the LVOT. Aorta: Normal size of the thoracic aorta with moderate diffuse atherosclerotic plaque and calcifications and no dissection. Sinotubular Junction: 34 x 30 mm Ascending Thoracic Aorta: 37 x 35 mm Aortic Arch: 26 x 23 mm Descending Thoracic Aorta: 28 x 27 mm Sinus of Valsalva Measurements: Non-coronary: 38 mm Right -coronary: 36 mm Left -coronary: 37 mm Coronary Artery Height above Annulus: Left Main: 15 mm Right Coronary: 15 mm Virtual Basal Annulus Measurements: Maximum/Minimum Diameter: 29.3 x 23.4 mm Mean Diameter: 25.6 mm Perimeter: 83.1 mm Area: 514 mm2 Optimum Fluoroscopic Angle for Delivery: LAO 2 CAU 1 IMPRESSION: 1. Trileaflet aortic valve with severely calcified leaflet with severe motion restriction and only minimal calcifications extending into the LVOT. Annular  measurements suitable for delivery of a 26 mm Edwards-SAPIEN 3 Ultra valve. Aortic valve calcium score 3373 consistent with severe aortic stenosis. 2. Sufficient coronary to annulus distance. 3. Optimum Fluoroscopic Angle for Delivery: LAO 2 CAU 1 4. No thrombus in the left atrial appendage. Electronically Signed   By: Tobias Alexander   On: 11/07/2019 07:39   Result Date: 11/07/2019 EXAM: OVER-READ INTERPRETATION  CT CHEST The following report is an over-read performed by radiologist Dr. Trudie Reed of [facility] on 11/06/2019. This over-read does not include interpretation of cardiac or coronary anatomy or pathology. The coronary calcium score/coronary CTA interpretation by the cardiologist is attached. COMPARISON:  None. FINDINGS: Extracardiac findings will be described separately under dictation for contemporaneously obtained CTA chest, abdomen and pelvis. IMPRESSION: Please see separate dictation for contemporaneously obtained CTA chest, abdomen and pelvis dated 11/06/2019 for full description of relevant extracardiac findings. Electronically Signed: By: Trudie Reed M.D. On: 11/06/2019 12:42   DG Chest Port 1 View  Result Date: 11/14/2019 CLINICAL DATA:  Status post aortic valve replacement EXAM: PORTABLE CHEST 1 VIEW COMPARISON:  November 10, 2019 FINDINGS: The lungs are clear. Heart is upper normal in size with pulmonary vascularity normal. There is a prosthetic aortic valve. Patient is status post coronary artery  bypass grafting. Pacemaker lead is attached to the right ventricle. There is aortic atherosclerosis. No adenopathy. No pneumothorax. No bone lesions. IMPRESSION: Postoperative changes. Lungs clear. Heart upper normal in size. No pneumothorax. Aortic Atherosclerosis (ICD10-I70.0). Electronically Signed   By: Bretta Bang III M.D.   On: 11/14/2019 12:21   CT ANGIO CHEST AORTA W/CM & OR WO/CM  Result Date: 11/06/2019 CLINICAL DATA:  85 year old male with history of severe  aortic stenosis. Preprocedural study prior to potential transcatheter aortic valve replacement (TAVR) procedure. EXAM: CT ANGIOGRAPHY CHEST, ABDOMEN AND PELVIS TECHNIQUE: Non-contrast CT of the chest was initially obtained. Multidetector CT imaging through the chest, abdomen and pelvis was performed using the standard protocol during bolus administration of intravenous contrast. Multiplanar reconstructed images and MIPs were obtained and reviewed to evaluate the vascular anatomy. CONTRAST:  OMNIPAQUE IOHEXOL 350 MG/ML SOLN COMPARISON:  No priors. FINDINGS: CTA CHEST FINDINGS Cardiovascular: Heart size is normal. There is no significant pericardial fluid, thickening or pericardial calcification. There is aortic atherosclerosis, as well as atherosclerosis of the great vessels of the mediastinum and the coronary arteries, including calcified atherosclerotic plaque in the left main, left anterior descending, left circumflex and right coronary arteries. Status post median sternotomy for CABG including LIMA to the LAD. Severe thickening calcification of the aortic valve. Mediastinum/Lymph Nodes: No pathologically enlarged mediastinal or hilar lymph nodes. Esophagus is unremarkable in appearance. No axillary lymphadenopathy. Lungs/Pleura: No suspicious appearing pulmonary nodules or masses are noted. No acute consolidative airspace disease. No pleural effusions. Musculoskeletal/Soft Tissues: Median sternotomy wires. There are no aggressive appearing lytic or blastic lesions noted in the visualized portions of the skeleton. CTA ABDOMEN AND PELVIS FINDINGS Hepatobiliary: No suspicious cystic or solid hepatic lesions. No intra or extrahepatic biliary ductal dilatation. Gallbladder is normal in appearance. Pancreas: No pancreatic mass. No pancreatic ductal dilatation. No pancreatic or peripancreatic fluid collections or inflammatory changes. Spleen: Unremarkable. Adrenals/Urinary Tract: Multiple small nonobstructive  calculi are noted within the collecting systems of both kidneys measuring up to 4 mm in the lower pole collecting system of the right kidney. Multiple small renal lesions are noted bilaterally, most of which are too small to definitively characterize. Several of the larger lesions are intermediate to higher attenuation, and are concerning for potential enhancing lesions, but are incompletely characterized on today's study. The most concerning of these is in the interpolar region of the left kidney (axial image 120 of series 16) measuring 2.2 x 1.9 cm (72 HU). No hydroureteronephrosis. Urinary bladder is normal in appearance. Bilateral adrenal glands are normal in appearance. Stomach/Bowel: Normal appearance of the stomach. No pathologic dilatation of small bowel or colon. Numerous colonic diverticulae are noted, particularly in the sigmoid colon, without surrounding inflammatory changes to suggest an acute diverticulitis at this time. The appendix is not confidently identified and may be surgically absent. Regardless, there are no inflammatory changes noted adjacent to the cecum to suggest the presence of an acute appendicitis at this time. Vascular/Lymphatic: Aortic atherosclerosis, with vascular findings and measurements pertinent to potential TAVR procedure, as detailed below. 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 Reproductive: Prostate gland and seminal vesicles are unremarkable in appearance. Other: No significant volume of ascites.  No pneumoperitoneum. Musculoskeletal: Chronic appearing compression fractures of L1 and L2 with approximately 30% loss of anterior vertebral body height at both levels. 5 mm sclerotic lesion with narrow zone of transition in the left sacral ala, likely to represent a bone island.  There are no other more aggressive appearing lytic or blastic lesions noted in the visualized portions of the skeleton.  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 Right Common Femoral Artery - Minimal Diameter-8.2 x 7.7 mm Tortuosity-mild Calcification-mild-to-moderate LEFT PELVIS: Left Common Iliac Artery - Minimal Diameter-11.3 x 6.8 mm Tortuosity-moderate Calcification-moderate Comment: Short segment dissection of the proximal left common iliac artery. Left External Iliac Artery - Minimal Diameter-8.7 x 9.0 mm Tortuosity-severe Calcification-mild Left Common Femoral Artery - Minimal Diameter-8.9 x 7.4 mm Tortuosity-mild Calcification-mild Review of the MIP images confirms the above findings. IMPRESSION: 1. Vascular findings and measurements pertinent to potential TAVR procedure, as detailed above. In particular, please take note of the short segment dissections in the right external iliac and left common iliac arteries. 2. Severe thickening calcification of the aortic valve, compatible with the reported clinical history of severe aortic stenosis. 3. Aortic atherosclerosis, in addition to left main and 3 vessel coronary artery disease. Status post median sternotomy for CABG including LIMA to the LAD. 4. Multiple indeterminate lesions in the kidneys, most concerning of which measures 2.2 x 1.9 cm in the interpolar region of the left kidney. Further characterization with nonemergent abdominal MRI with and without IV gadolinium is strongly recommended in the near future to exclude the possibility of underlying renal neoplasm. 5. Nonobstructive calculi in the collecting systems of both kidneys measuring up to 4 mm in the lower pole collecting system of the right kidney.  6. Colonic diverticulosis without evidence of acute diverticulitis at this time. 7. Additional incidental findings, as above. Electronically Signed   By: Trudie Reed M.D.   On: 11/06/2019 13:36   VAS US CAROTID  Result Date: 11/10/2019 Carotid Arterial Duplex Study Indications:  Severe aortic stenosis, pre TAVR. Risk Factors: Hypertension, coronary artery disease. Performing Technologist: Jean Rosenthal  Examination Guidelines: A complete evaluation includes B-mode imaging, spectral Doppler, color Doppler, and power Doppler as needed of all accessible portions of each vessel. Bilateral testing is considered an integral part of a complete examination. Limited examinations for reoccurring indications may be performed as noted.  Right Carotid Findings: +----------+--------+--------+--------+---------------------+------------------+           PSV cm/sEDV cm/sStenosisPlaque Description   Comments           +----------+--------+--------+--------+---------------------+------------------+ CCA Prox  63      12                                   intimal thickening +----------+--------+--------+--------+---------------------+------------------+ CCA Distal64      14              heterogenous         intimal thickening +----------+--------+--------+--------+---------------------+------------------+ ICA Prox  125     39      1-39%   irregular and                                                             calcific                                +----------+--------+--------+--------+---------------------+------------------+  ICA Mid   93      28                                                      +----------+--------+--------+--------+---------------------+------------------+ ICA Distal64      26                                                      +----------+--------+--------+--------+---------------------+------------------+ ECA       77      11                                                       +----------+--------+--------+--------+---------------------+------------------+ +----------+--------+-------+----------------+-------------------+           PSV cm/sEDV cmsDescribe        Arm Pressure (mmHG) +----------+--------+-------+----------------+-------------------+ [id number]             Multiphasic, WNL                    +----------+--------+-------+----------------+-------------------+ +---------+--------+--+--------+-+---------+ VertebralPSV cm/s30EDV cm/s9Antegrade +---------+--------+--+--------+-+---------+  Left Carotid Findings: +----------+--------+--------+--------+---------------------+------------------+           PSV cm/sEDV cm/sStenosisPlaque Description   Comments           +----------+--------+--------+--------+---------------------+------------------+ CCA Prox  70      17                                   intimal thickening +----------+--------+--------+--------+---------------------+------------------+ CCA Distal59      15              heterogenous         intimal thickening +----------+--------+--------+--------+---------------------+------------------+ ICA Prox  123     40      40-59%  irregular and                                                             calcific                                +----------+--------+--------+--------+---------------------+------------------+ ICA Distal89      20                                                      +----------+--------+--------+--------+---------------------+------------------+ ECA       107     17                                                      +----------+--------+--------+--------+---------------------+------------------+ +----------+--------+--------+----------------+-------------------+  PSV cm/sEDV cm/sDescribe        Arm Pressure (mmHG) +----------+--------+--------+----------------+-------------------+  [id number]              Multiphasic, WNL                    +----------+--------+--------+----------------+-------------------+ +---------+--------+--+--------+--+---------+ VertebralPSV cm/s59EDV cm/s16Antegrade +---------+--------+--+--------+--+---------+   Summary: Right Carotid: Velocities in the right ICA are consistent with a 1-39% stenosis. Left Carotid: Velocities in the left ICA are consistent with a 40-59% stenosis. Vertebrals:  Bilateral vertebral arteries demonstrate antegrade flow. Subclavians: Normal flow hemodynamics were seen in bilateral subclavian              arteries. *See table(s) above for measurements and observations.  Electronically signed by Sherald Hess MD on 11/10/2019 at 2:42:03 PM.    Final    ECHOCARDIOGRAM LIMITED  Result Date: 11/15/2019    ECHOCARDIOGRAM LIMITED REPORT   Patient Name:   Jorge Cherry Date of Exam: 11/15/2019 Medical Rec #:  [id number]    Height:       68.0 in Accession #:    [id number]   Weight:       195.3 lb Date of Birth:  03/27/1936    BSA:          2.023 m Patient Age:    83 years     BP:           112/55 mmHg Patient Gender: M            HR:           48 bpm. Exam Location:  Inpatient Procedure: Limited Echo, Limited Color Doppler and Cardiac Doppler Indications:    Post TAVR evaluation V43.3 / Z95.2  History:        Patient has prior history of Echocardiogram examinations, most                 recent 11/14/2019. CAD, Arrythmias:Atrial Fibrillation; Risk                 Factors:Dyslipidemia and Hypertension. Chronic kideny disease,                 asthma, GERD.                 Aortic Valve: 26 mm Ultra, stented (TAVR) valve is present in                 the aortic position. Procedure Date: 11/14/2019.  Sonographer:    Leta Jungling RDCS Referring Phys: [id number] KATHRYN R THOMPSON IMPRESSIONS  1. Day 1 post TAVR, no paravalvular leak. Normal transaortic gradients peak/mean 17/10 mmHg. LVEF improved to 55-60%.  2. Left ventricular ejection  fraction, by estimation, is 55 to 60%. The left ventricle has normal function. There is moderate concentric left ventricular hypertrophy.  3. Trivial mitral valve regurgitation.  4. The aortic valve has been repaired/replaced. There is a 26 mm Ultra, stented (TAVR) valve present in the aortic position. Procedure Date: 11/14/2019. Echo findings are consistent with normal structure and function of the aortic valve prosthesis. Aortic valve mean gradient measures 10.0 mmHg.  5. There is normal pulmonary artery systolic pressure. The estimated right ventricular systolic pressure is 29.6 mmHg. FINDINGS  Left Ventricle: Left ventricular ejection fraction, by estimation, is 55 to 60%. The left ventricle has normal function. There is moderate concentric left ventricular hypertrophy. Right Ventricle: There is normal pulmonary artery systolic pressure. The tricuspid regurgitant velocity is 2.58 m/s, and with an assumed  right atrial pressure of 3 mmHg, the estimated right ventricular systolic pressure is 29.6 mmHg. Mitral Valve: Trivial mitral valve regurgitation. Tricuspid Valve: Tricuspid valve regurgitation is mild. Aortic Valve: The aortic valve has been repaired/replaced. Aortic valve mean gradient measures 10.0 mmHg. Aortic valve peak gradient measures 16.6 mmHg. Aortic valve area, by VTI measures 2.26 cm. There is a 26 mm Ultra, stented (TAVR) valve present in the aortic position. Procedure Date: 11/14/2019. Echo findings are consistent with normal structure and function of the aortic valve prosthesis.  LEFT VENTRICLE PLAX 2D LVIDd:         5.40 cm LVIDs:         4.20 cm LV PW:         1.30 cm LV IVS:        1.60 cm LVOT diam:     2.35 cm LV SV:         92 LV SV Index:   46 LVOT Area:     4.34 cm  AORTIC VALVE AV Area (Vmax):    2.28 cm AV Area (Vmean):   2.39 cm AV Area (VTI):     2.26 cm AV Vmax:           203.50 cm/s AV Vmean:          144.000 cm/s AV VTI:            0.409 m AV Peak Grad:      16.6 mmHg AV Mean  Grad:      10.0 mmHg LVOT Vmax:         107.00 cm/s LVOT Vmean:        79.300 cm/s LVOT VTI:          0.213 m LVOT/AV VTI ratio: 0.52 TRICUSPID VALVE TR Peak grad:   26.6 mmHg TR Vmax:        258.00 cm/s  SHUNTS Systemic VTI:  0.21 m Systemic Diam: 2.35 cm Tobias Alexander MD Electronically signed by Tobias Alexander MD Signature Date/Time: 11/15/2019/10:18:49 PM    Final    ECHOCARDIOGRAM LIMITED  Result Date: 11/14/2019    ECHOCARDIOGRAM LIMITED REPORT   Patient Name:   Jorge Cherry Date of Exam: 11/14/2019 Medical Rec #:  [id number]    Height:       68.0 in Accession #:    [id number]   Weight:       195.6 lb Date of Birth:  1936-04-25    BSA:          2.025 m Patient Age:    83 years     BP:           163/73 mmHg Patient Gender: M            HR:           66 bpm. Exam Location:  Inpatient Procedure: Limited Echo, Color Doppler and Cardiac Doppler Indications:     Aortic Stenosis i35.0  History:         Patient has prior history of Echocardiogram examinations, most                  recent 08/31/2019. CHF, Prior CABG, Arrythmias:Atrial                  Fibrillation; Risk Factors:Hypertension, Dyslipidemia and Sleep                  Apnea.  Sonographer:     Irving Burton Senior RDCS Referring Phys:  [id number] CHRISTOPHER D  MCALHANY Diagnosing Phys: Tobias Alexander MD  Sonographer Comments: TAVR Procedure for implantation of 26mm Edwards Ultra Sapien Bioprosthetic IMPRESSIONS  1. Periprocedural limited TTE during a TAVR procedure. A 26 mm Edwards-SAPIEN 3 Ultra valve was successfully deployed in the aortic position with improvement of transaortic gradients from peak/mean 59/40 mmHg to 7/3 mmHg. No paravalvular leak was seen. No pericardial effusion prior or post procedure. LVEF has slightly improved post procedure from 45-50% to 50-55%.  2. Left ventricular ejection fraction, by estimation, is 50 to 55%. The left ventricle has low normal function. There is mild concentric left ventricular hypertrophy. Left ventricular diastolic  function could not be evaluated.  3. Right ventricular systolic function is normal. The right ventricular size is normal.  4. Left atrial size was mildly dilated.  5. The mitral valve is degenerative. Mild mitral valve regurgitation. No evidence of mitral stenosis.  6. The aortic valve is tricuspid. Aortic valve regurgitation is not visualized. Severe aortic valve stenosis. Aortic valve mean gradient measures 40.0 mmHg. FINDINGS  Left Ventricle: Left ventricular ejection fraction, by estimation, is 50 to 55%. The left ventricle has low normal function. There is mild concentric left ventricular hypertrophy. Right Ventricle: The right ventricular size is normal. No increase in right ventricular wall thickness. Right ventricular systolic function is normal. Left Atrium: Left atrial size was mildly dilated. Right Atrium: Right atrial size was normal in size. Pericardium: There is no evidence of pericardial effusion. Mitral Valve: The mitral valve is degenerative in appearance. There is moderate thickening of the mitral valve leaflet(s). There is moderate calcification of the mitral valve leaflet(s). Mild mitral valve regurgitation. No evidence of mitral valve stenosis. Tricuspid Valve: The tricuspid valve is normal in structure. Tricuspid valve regurgitation is trivial. Aortic Valve: The aortic valve is tricuspid. . There is severe thickening and severe calcifcation of the aortic valve. Aortic valve regurgitation is not visualized. Severe aortic stenosis is present. There is severe thickening of the aortic valve. There is severe calcifcation of the aortic valve. Aortic valve mean gradient measures 40.0 mmHg. Aortic valve peak gradient measures 7.0 mmHg. Aortic valve area, by VTI measures 2.48 cm. Aorta: The aortic root is normal in size and structure.  LEFT VENTRICLE PLAX 2D LVOT diam:     2.00 cm LV SV:         68 LV SV Index:   33 LVOT Area:     3.14 cm  AORTIC VALVE AV Area (Vmax):    2.25 cm AV Area (Vmean):    2.46 cm AV Area (VTI):     2.48 cm AV Vmax:           132.00 cm/s AV Vmean:          84.000 cm/s AV VTI:            0.272 m AV Peak Grad:      7.0 mmHg AV Mean Grad:      40.0 mmHg LVOT Vmax:         94.60 cm/s LVOT Vmean:        65.700 cm/s LVOT VTI:          0.215 m LVOT/AV VTI ratio: 0.79  SHUNTS Systemic VTI:  0.22 m Systemic Diam: 2.00 cm Tobias Alexander MD Electronically signed by Tobias Alexander MD Signature Date/Time: 11/14/2019/11:04:54 AM    Final    Structural Heart Procedure  Result Date: 11/14/2019 See surgical note for result.  Structural Heart Procedure  Result Date: 11/14/2019 See surgical note for  result.  CT Angio Abd/Pel w/ and/or w/o  Result Date: 11/06/2019 CLINICAL DATA:  84 year old male with history of severe aortic stenosis. Preprocedural study prior to potential transcatheter aortic valve replacement (TAVR) procedure. EXAM: CT ANGIOGRAPHY CHEST, ABDOMEN AND PELVIS TECHNIQUE: Non-contrast CT of the chest was initially obtained. Multidetector CT imaging through the chest, abdomen and pelvis was performed using the standard protocol during bolus administration of intravenous contrast. Multiplanar reconstructed images and MIPs were obtained and reviewed to evaluate the vascular anatomy. CONTRAST:  OMNIPAQUE IOHEXOL 350 MG/ML SOLN COMPARISON:  No priors. FINDINGS: CTA CHEST FINDINGS Cardiovascular: Heart size is normal. There is no significant pericardial fluid, thickening or pericardial calcification. There is aortic atherosclerosis, as well as atherosclerosis of the great vessels of the mediastinum and the coronary arteries, including calcified atherosclerotic plaque in the left main, left anterior descending, left circumflex and right coronary arteries. Status post median sternotomy for CABG including LIMA to the LAD. Severe thickening calcification of the aortic valve. Mediastinum/Lymph Nodes: No pathologically enlarged mediastinal or hilar lymph nodes. Esophagus is  unremarkable in appearance. No axillary lymphadenopathy. Lungs/Pleura: No suspicious appearing pulmonary nodules or masses are noted. No acute consolidative airspace disease. No pleural effusions. Musculoskeletal/Soft Tissues: Median sternotomy wires. There are no aggressive appearing lytic or blastic lesions noted in the visualized portions of the skeleton. CTA ABDOMEN AND PELVIS FINDINGS Hepatobiliary: No suspicious cystic or solid hepatic lesions. No intra or extrahepatic biliary ductal dilatation. Gallbladder is normal in appearance. Pancreas: No pancreatic mass. No pancreatic ductal dilatation. No pancreatic or peripancreatic fluid collections or inflammatory changes. Spleen: Unremarkable. Adrenals/Urinary Tract: Multiple small nonobstructive calculi are noted within the collecting systems of both kidneys measuring up to 4 mm in the lower pole collecting system of the right kidney. Multiple small renal lesions are noted bilaterally, most of which are too small to definitively characterize. Several of the larger lesions are intermediate to higher attenuation, and are concerning for potential enhancing lesions, but are incompletely characterized on today's study. The most concerning of these is in the interpolar region of the left kidney (axial image 120 of series 16) measuring 2.2 x 1.9 cm (72 HU). No hydroureteronephrosis. Urinary bladder is normal in appearance. Bilateral adrenal glands are normal in appearance. Stomach/Bowel: Normal appearance of the stomach. No pathologic dilatation of small bowel or colon. Numerous colonic diverticulae are noted, particularly in the sigmoid colon, without surrounding inflammatory changes to suggest an acute diverticulitis at this time. The appendix is not confidently identified and may be surgically absent. Regardless, there are no inflammatory changes noted adjacent to the cecum to suggest the presence of an acute appendicitis at this time. Vascular/Lymphatic: Aortic  atherosclerosis, with vascular findings and measurements pertinent to potential TAVR procedure, as detailed below. 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 Reproductive: Prostate gland and seminal vesicles are unremarkable in appearance. Other: No significant volume of ascites.  No pneumoperitoneum. Musculoskeletal: Chronic appearing compression fractures of L1 and L2 with approximately 30% loss of anterior vertebral body height at both levels. 5 mm sclerotic lesion with narrow zone of transition in the left sacral ala, likely to represent a bone island. There are no other more aggressive appearing lytic or blastic lesions noted in the visualized portions of the skeleton. VASCULAR MEASUREMENTS PERTINENT TO TAVR: AORTA: Minimal Aortic Diameter-12 x 12 mm Severity of Aortic Calcification-severe RIGHT PELVIS: Right Common Iliac Artery - Minimal Diameter-10.2 x 10.4 mm Tortuosity-moderate Calcification-moderate Right External Iliac Artery -  Minimal Diameter-8.6 x 8.1 mm Tortuosity-severe Calcification-mild Comment: Short segment dissection of the distal right external iliac artery. Right Common Femoral Artery - Minimal Diameter-8.2 x 7.7 mm Tortuosity-mild Calcification-mild-to-moderate LEFT PELVIS: Left Common Iliac Artery - Minimal Diameter-11.3 x 6.8 mm Tortuosity-moderate Calcification-moderate Comment: Short segment dissection of the proximal left common iliac artery. Left External Iliac Artery - Minimal Diameter-8.7 x 9.0 mm Tortuosity-severe Calcification-mild Left Common Femoral Artery - Minimal Diameter-8.9 x 7.4 mm Tortuosity-mild Calcification-mild Review of the MIP images confirms the above findings. IMPRESSION: 1. Vascular findings and measurements pertinent to potential TAVR procedure, as detailed above. In particular, please take note of the short segment dissections in the right external iliac and left common iliac arteries.  2. Severe thickening calcification of the aortic valve, compatible with the reported clinical history of severe aortic stenosis. 3. Aortic atherosclerosis, in addition to left main and 3 vessel coronary artery disease. Status post median sternotomy for CABG including LIMA to the LAD. 4. Multiple indeterminate lesions in the kidneys, most concerning of which measures 2.2 x 1.9 cm in the interpolar region of the left kidney. Further characterization with nonemergent abdominal MRI with and without IV gadolinium is strongly recommended in the near future to exclude the possibility of underlying renal neoplasm. 5. Nonobstructive calculi in the collecting systems of both kidneys measuring up to 4 mm in the lower pole collecting system of the right kidney. 6. Colonic diverticulosis without evidence of acute diverticulitis at this time. 7. Additional incidental findings, as above. Electronically Signed   By: Trudie Reed M.D.   On: 11/06/2019 13:36    EKG: atrial fib LBBB  -/18/46   Assessment and Plan:  Syncope recurrent  RBBB pre tTAVR,  LBBB post TAVR  ICM, s/p CABG normal LV function  AF paroxysmal    HFpEF    Pt with antecedent RBBB and then post op LBBB and syncope with heart block in setting of recurrent afib  The benefits and risks were reviewed including but not limited to death,  perforation, infection, lead dislodgement and device malfunction.  The patient understands agrees and is willing to proceed.  Will hold Apixoban  Post implant for a few days and then resume> DCCV May need TEE as still somewhat limited  Volume overloaded  Will plan IV Diuresis.     Sherryl Manges

## 2019-11-20 NOTE — H&P (Signed)
Cardiology Admission History and Physical:   Patient ID: Jorge Cherry MRN: [id number]; DOB: [birth year]   Admission date: (Not on file)  Primary Care Provider: Chilton Greathouse, MD [facility] Cardiologist: Jorge Guadalajara, MD  [facility] Electrophysiologist:  Jorge Manges, MD   Chief Complaint: Syncope  Patient Profile:   Jorge Cherry is a 84 y.o. male with CAD status four-vessel CABG in 1994 with LIMA to LAD, SVG to OM, SVG to diagonal, and SVG to RCA, severe aortic stenosis status post recent TAVR on 11/14/2019, PAF on Eliquis, HFpEF, CVA, CKD stage III with nonspecific renal lesions noted, prior GI bleed, HTN, HLD, and GERD who is being seen today for the evaluation of syncope.  History of Present Illness:   Jorge Cherry underwent four-vessel CABG in 1994.  Subsequent LHC in 02/2004 with PCI to the SVG to D2 with documented occlusion of the SVG to RCA.  LHC in 11/2016 with severe native vessel CAD, known occluded SVG to RCA, patent SVG to 2nd diagonal, patent D2 stent, patent SVT to dOM branch, and patent LIMA to LAD, with an area of the  Proximal LAD which had 95% stenosis and was not supplied by the LIMA that was recommended for medical management.  Over the years he has been monitored for aortic stenosis with echo in 2019 showing normal LV SF with moderate aortic stenosis.  He was seen in 07/2019 with stable chronic angina and chronic lower extremity edema.  Repeat echo at that time showed an EF of 55 to 60%, grade 2 diastolic dysfunction, mild regurgitation, and moderate to severe aortic stenosis with a mean gradient of 35 mmHg, peak gradient 67 mercury, AVA 1.05 cm.  With worsening symptoms, he underwent diagnostic R/LHC on 10/18/2019 which demonstrated severe native vessel CAD with occlusion of all native vessels and 3 patent grafts (LIMA to LAD, SVG to diagonal, SVG to OM) with known CTO of the SVG to RCA.  Medical management was advised patient was subsequently evaluated by the structural  heart team and underwent successful TAVR with a 26 mm Edwards Sapien 3 Ultra THV on 11/14/2019.  Post procedure echo stable as outlined below.  His preprocedure EKG showed first-degree AV block with prior RBBB and left anterior fascicular block.  Post procedure he was now in A. fib with nonspecific IVCD and LBBB pattern with up to 2.5-second pauses.  In this setting, Zio patch was placed.  Patient indicates he was in his usual state of health around the 4th July when he was walking around a construction site of his daughter's new home when he reports he "blacked out."  He does not recall the circumstances surrounding this event.  However, he later tells me he did not suffer a syncopal episode and states he tripped over a rock.  More recently, on 7/11 the patient had driven to [employer] for dinner around 7 PM.  Upon getting out of the truck he became very dizzy and suffered a sudden syncopal episode striking the right side of his head along the rubber step of his truck.  He denies any preceding angina, palpitations, diaphoresis, nausea, or vomiting.  No seizure-like symptoms.  No loss of bowel or bladder function.  Indicates he was without consciousness for "a moment."  In this setting he was brought to [facility] ED last evening.  Review of his Zio patch showed multiple pauses lasting from 8 to 11 seconds in duration.  CT head showed no acute intracranial pathology.  Old right occipital infarct  was noted.  Labs showed an initial high-sensitivity troponin of 39 with a delta pending.  Potassium 3.9.  Telemetry as demonstrated A. fib with slow ventricular response with bundle branch block with longest R-R interval being 3.7 seconds.  Case was discussed with the cardiology fellow at [facility] overnight who accepted the patient.  EP is aware of the patient and is planning on pacemaker today.  He is n.p.o.  His last dose of Eliquis was on the morning of 11/19/2019.  He is not on any AV nodal blocking medications.  Since his arrival  to [facility] he has been asymptomatic without any further syncope or dizziness.  No chest pain, dyspnea, nausea, vomiting.   Past Medical History:  Diagnosis Date  . Arthritis    "just a touch in my hands" (11/24/2016)  . Asthma   . Atherosclerosis of renal artery (HCC)    RENAL DOPPLER, 12/10/2011 - Left renal artery demonstrated narrowing with elevated velocities consistent with a 1-59% diameter reduction  . CKD (chronic kidney disease) stage 3, GFR 30-59 ml/min    "stable now since they backed off the water pills" (11/24/2016)  . Coronary artery disease    a. 1994 s/p CABG x 4 (LIMA-LAD, VG->D2, VG->OM, VG->RCA); b. 02/2004 PCI SVG-D2 (3.5x16 Taxus DES). VG->RCA 100. Sev apical LAD dzs distal to LIMA insertion; c. 11/2016 Cath: LAD 95p/119m, D2 100ost, LCX 100ost, 70p/m, RCA 100p/m, RPDA fills via L->R collats. VG->RCA 100, VG->D2 20 ost, patent prox stent, 85m, LIMA->LAD ok, VG->OM3 20p. EF 55%-->Med Rx.  . GERD (gastroesophageal reflux disease)   . High cholesterol   . History of lower GI bleeding    a. 09/2014 GIB due to diverticulosis/diverticulitis.  . Iron deficiency anemia   . Labile Hypertension   . Moderate aortic stenosis    a. 10/2011 Echo:  EF >55%, mild-mod TR, mild-mod AS, mod Ca2+ of AoV leaflets; b. 07/2016 Echo: EF 60-65%, no rwma, Gr1 DD, mod AS [(S) mean grad , peak grad . Valve area (VTI): 1.33cm^2, (Vmax) 1.44cm^2. Mild MR]; c. 02/2018 Echo: EF 55-60%, no rwma, GR1 DD, Mod AS [Peak Vel (S): 319cm/s, Mean grad (S) , peak grad (S) 46mmHg].  [name] PAF (paroxysmal atrial fibrillation) (HCC)    a. CHA2DS2VASc = 4-->Eliquis.  . S/P TAVR (transcatheter aortic valve replacement) 11/14/2019   s/p TAVR with a 26 mm Edwards S3U via the TF approach by Drs Clifton James & Bartle  . Sleep apnea     Past Surgical History:  Procedure Laterality Date  . CARDIAC CATHETERIZATION  02/27/2004   Coronary intervention and medical management  . CARDIAC CATHETERIZATION  11/24/2016  .  CATARACT EXTRACTION W/ INTRAOCULAR LENS IMPLANT Left   . CORONARY ANGIOPLASTY  1994 X 2   "before bypass surgery"  . CORONARY ANGIOPLASTY WITH STENT PLACEMENT  03/04/2004   SVG supplying the diagonal vessel stented with a 3.5x58mm Taxus stent post dilated to 4.0 mm  . CORONARY ARTERY BYPASS GRAFT  1994   "CABG X4"  . INGUINAL HERNIA REPAIR Right   . RIGHT HEART CATH AND CORONARY/GRAFT ANGIOGRAPHY N/A 11/24/2016   Procedure: Right Heart Cath and Coronary/Graft Angiography;  Surgeon: Lennette Bihari, MD;  Location: [facility] INVASIVE CV LAB;  Service: Cardiovascular;  Laterality: N/A;  . RIGHT/LEFT HEART CATH AND CORONARY/GRAFT ANGIOGRAPHY N/A 10/18/2019   Procedure: RIGHT/LEFT HEART CATH AND CORONARY/GRAFT ANGIOGRAPHY;  Surgeon: Lennette Bihari, MD;  Location: [facility] INVASIVE CV LAB;  Service: Cardiovascular;  Laterality: N/A;  . TEE WITHOUT CARDIOVERSION N/A  11/14/2019   Procedure: TRANSESOPHAGEAL ECHOCARDIOGRAM (TEE);  Surgeon: Kathleene Hazel, MD;  Location: [facility] INVASIVE CV LAB;  Service: Open Heart Surgery;  Laterality: N/A;  . TONSILLECTOMY AND ADENOIDECTOMY    . TRANSCATHETER AORTIC VALVE REPLACEMENT, TRANSFEMORAL N/A 11/14/2019   Procedure: TRANSCATHETER AORTIC VALVE REPLACEMENT, TRANSFEMORAL;  Surgeon: Kathleene Hazel, MD;  Location: [facility] INVASIVE CV LAB;  Service: Open Heart Surgery;  Laterality: N/A;     Medications Prior to Admission: Prior to Admission medications   Medication Sig Start Date End Date Taking? Authorizing Provider  albuterol (VENTOLIN HFA) 108 (90 Base) MCG/ACT inhaler INHALE TWO PUFFS EVERY 4-6 HOURS AS NEEDED FOR COUGH OR WHEEZE Patient taking differently: Inhale 2 puffs into the lungs every 4 (four) hours as needed for wheezing or shortness of breath.  10/30/19   Kozlow, Alvira Philips, MD  amiodarone (PACERONE) 200 MG tablet Take 1 tablet (200 mg total) by mouth daily. 09/21/19   Lennette Bihari, MD  amLODipine (NORVASC) 2.5 MG tablet Take 1 tablet (2.5 mg total) by mouth 2  (two) times daily. NEED OV. 06/12/19   Lennette Bihari, MD  aspirin 81 MG chewable tablet Chew 1 tablet (81 mg total) by mouth daily. 11/16/19   Janetta Hora, PA-C  atorvastatin (LIPITOR) 20 MG tablet Take 1 tablet (20 mg total) by mouth at bedtime. 03/16/18   Barnetta Chapel, MD  Azelastine-Fluticasone (DYMISTA) 137-50 MCG/ACT SUSP Place 1-2 sprays into the nose daily as needed (for seasonal allergies).    [provider]  docusate sodium (COLACE) 100 MG capsule Take 100 mg by mouth daily.     [provider]  ELIQUIS 2.5 MG TABS tablet TAKE 1 TABLET BY MOUTH TWICE A DAY 09/12/19   Lennette Bihari, MD  ezetimibe (ZETIA) 10 MG tablet TAKE 1 TABLET BY MOUTH EVERY DAY Patient taking differently: Take 10 mg by mouth daily. TAKE 1 TABLET BY MOUTH EVERY DAY 04/25/19   Lennette Bihari, MD  fenofibrate (TRICOR) 145 MG tablet Take 0.5 tablets (72.5 mg total) by mouth daily. 02/01/19   Lennette Bihari, MD  fluticasone (FLONASE) 50 MCG/ACT nasal spray Place 2 sprays into both nostrils daily.     [provider]  fluticasone (FLOVENT HFA) 110 MCG/ACT inhaler Inhale 1 puff into the lungs 3 (three) times daily as needed (for seasonal flares).     [provider]  ipratropium (ATROVENT) 0.06 % nasal spray Can use two sprays in each nostril every six hours as needed to dry up nose. Patient taking differently: Place 2 sprays into both nostrils 3 (three) times daily.  02/28/19   Kozlow, Alvira Philips, MD  isosorbide mononitrate (IMDUR) 60 MG 24 hr tablet TAKE 1 AND 1/2 TABS (90MG ) BY MOUTH EVERY MORNING AND 1/2 TAB EVERY EVENING Patient taking differently: Take 30-90 mg by mouth See admin instructions. TAKE 1 AND 1/2 TABS (90MG ) BY MOUTH EVERY MORNING AND 1/2 TAB EVERY EVENING 09/07/19   , MD  levothyroxine (SYNTHROID) 25 MCG tablet Take 25 mcg by mouth daily before breakfast.  07/08/19   [provider]  loratadine (CLARITIN) 10 MG tablet Take 10 mg by mouth  every evening.     [provider]  Multiple Vitamins-Minerals (CENTRUM SILVER 50+MEN) TABS Take 1 tablet by mouth at bedtime.    [provider]  nitroGLYCERIN (NITROLINGUAL) 0.4 MG/SPRAY spray PLACE 1 SPRAY UNDER THE TONGUE EVERY 5 (FIVE) MINUTES X 3 DOSES AS NEEDED FOR CHEST PAIN. 11/17/19  Kathleene Hazel, MD  Omega-3 Fatty Acids (FISH OIL) 1000 MG CAPS Take 1,000 mg by mouth 2 (two) times daily.     [provider]  pantoprazole (PROTONIX) 40 MG tablet TAKE 1 TABLET BY MOUTH EVERY DAY Patient taking differently: Take 40 mg by mouth daily.  10/02/19   Kozlow, Alvira Philips, MD  ranolazine (RANEXA) 500 MG 12 hr tablet TAKE 1 TABLET BY MOUTH TWICE A DAY Patient taking differently: Take 500 mg by mouth 2 (two) times daily.  09/19/19   Lennette Bihari, MD  vitamin C (ASCORBIC ACID) 500 MG tablet Take 500 mg by mouth at bedtime.     [provider]  vitamin E 400 UNIT capsule Take 400 Units by mouth daily.    [provider]  zinc gluconate 50 MG tablet Take 50 mg by mouth 2 (two) times a week.    [provider]     Allergies:    Allergies  Allergen Reactions  . Altace [Ramipril] Swelling and Other (See Comments)    Mouth swelling  . Mucinex [Guaifenesin Er] Hives, Swelling and Other (See Comments)    Mouth swelling  . Contrast Media [Iodinated Diagnostic Agents] Other (See Comments)    Made eyes change each time    Social History:   Social History   Socioeconomic History  . Marital status: Married    Spouse name: Not on file  . Number of children: 2  . Years of education: Not on file  . Highest education level: Not on file  Occupational History  . Not on file  Tobacco Use  . Smoking status: Never Smoker  . Smokeless tobacco: Never Used  Vaping Use  . Vaping Use: Never used  Substance and Sexual Activity  . Alcohol use: No    Alcohol/week: 0.0 standard drinks  . Drug use: No  . Sexual activity: Not Currently  Other  Topics Concern  . Not on file  Social History Narrative   Lives at home in [place] with wife.  Retired from [employer].     Social Determinants of Health   Financial Resource Strain:   . Difficulty of Paying Living Expenses:   Food Insecurity:   . Worried About Programme researcher, broadcasting/film/video in the Last Year:   . Barista in the Last Year:   Transportation Needs:   . Freight forwarder (Medical):   [name] Lack of Transportation (Non-Medical):   Physical Activity:   . Days of Exercise per Week:   . Minutes of Exercise per Session:   Stress:   . Feeling of Stress :   Social Connections:   . Frequency of Communication with Friends and Family:   . Frequency of Social Gatherings with Friends and Family:   . Attends Religious Services:   . Active Member of Clubs or Organizations:   . Attends Banker Meetings:   [name] Marital Status:   Intimate Partner Violence:   . Fear of Current or Ex-Partner:   . Emotionally Abused:   [name] Physically Abused:   . Sexually Abused:     Family History:   The patient's family history includes Cancer in his maternal grandfather and mother; Heart disease in his father; Stroke in his maternal grandmother. There is no history of Allergic rhinitis, Angioedema, Asthma, Atopy, Eczema, Immunodeficiency, or Urticaria.    ROS:  Please see the history of present illness.  All other ROS reviewed and negative.  Physical Exam/Data:   Vitals:   11/20/19 0321 11/20/19 0321 11/20/19 0707 11/20/19 0730  BP:  128/67 (!) 153/78 (!) 162/79  Pulse: (!) 58 (!) 49 61 (!) 31  Resp: (!) 34 (!) Temp:    98.7 F (37.1 C)  TempSrc:      SpO2: 92% 92% 94% 93%    General:  Well nourished, well developed, in no acute distress HEENT: Normal Lymph: No adenopathy Neck: No JVD Endocrine:  No thryomegaly Vascular: No carotid bruits; FA pulses 2+ bilaterally without bruits  Cardiac: Bradycardic, irregularly irregular S1, S2; RRR;  II/VI murmur RUSB Lungs: Scattered wheezes Abd: Soft, nontender, no hepatomegaly  Ext: Trace bilateral pretibial edema Musculoskeletal:  No deformities, BUE and BLE strength normal and equal Skin: warm and dry  Neuro:  CNs 2-12 intact, no focal abnormalities noted Psych:  Normal affect    EKG:  The ECG that was done 11/19/2019 was personally reviewed and demonstrates A. fib, 52 bpm, nonspecific IVCD and LBBB pattern  Relevant CV Studies:  [facility] 10/18/2019:  Ost RCA to Dist RCA lesion is 100% stenosed.  Origin lesion is 100% stenosed.  Ost Cx to Prox Cx lesion is 100% stenosed.  Prox LAD to Mid LAD lesion is 100% stenosed with 100% stenosed side branch in 1st Diag.  Dist Graft lesion is 70% stenosed.  Prox Graft to Mid Graft lesion is 40% stenosed.  Previously placed Origin to Prox Graft stent (unknown type) is widely patent.  Severe native coronary restrictive disease with total occlusion of the proximal LAD prior to any diagonal vessel takeoff; total occlusion of the ostium of the left circumflex coronary artery and total occlusion of the ostium of the native RCA.  Patent LIMA graft supplying the mid LAD.  Patent large SVG supplying the OM 2 vessel with filling of the circumflex to the OM1 vessel with 70% mid AV groove stenosis and evidence for collateralization to the distal RCA.  Patent stent in the proximal SVG supplying the diagonal vessel with mid graft narrowing of 40% and focal 70% distal graft stenosis.  Old occluded graft which had supplied the distal RCA.  Severe aortic stenosis with a mean transvalvular aortic gradient at 41 mm.\  Mild right heart pressure elevation.  RECOMMENDATION: I reviewed the angiographic findings with Dr. Excell Seltzer. Will refer for TAVR consultation. Medical therapy for his significant CAD. Patient will resume Eliquis tomorrow.  __________  Limited echo 11/14/2019: 1. Periprocedural limited TTE during a TAVR procedure. A 26 mm    Edwards-SAPIEN 3 Ultra valve was successfully deployed in the aortic  position with improvement of transaortic gradients from peak/mean 59/40  mmHg to 7/3 mmHg. No paravalvular leak was seen.  No pericardial effusion prior or post procedure. LVEF has slightly  improved post procedure from 45-50% to 50-55%.  2. Left ventricular ejection fraction, by estimation, is 50 to 55%. The  left ventricle has low normal function. There is mild concentric left  ventricular hypertrophy. Left ventricular diastolic function could not be  evaluated.  3. Right ventricular systolic function is normal. The right ventricular  size is normal.  4. Left atrial size was mildly dilated.  5. The mitral valve is degenerative. Mild mitral valve regurgitation. No  evidence of mitral stenosis.  6. The aortic valve is tricuspid. Aortic valve regurgitation is not  visualized. Severe aortic valve stenosis. Aortic valve mean gradient  measures 40.0 mmHg. __________  Limited echo 11/15/2019: 1. Day 1 post TAVR, no paravalvular leak.  Normal transaortic gradients  peak/mean 17/10 mmHg. LVEF improved to 55-60%.  2. Left ventricular ejection fraction, by estimation, is 55 to 60%. The  left ventricle has normal function. There is moderate concentric left  ventricular hypertrophy.  3. Trivial mitral valve regurgitation.  4. The aortic valve has been repaired/replaced. There is a 26 mm Ultra,  stented (TAVR) valve present in the aortic position. Procedure Date:  11/14/2019. Echo findings are consistent with normal structure and  function of the aortic valve prosthesis.  Aortic valve mean gradient measures 10.0 mmHg.  5. There is normal pulmonary artery systolic pressure. The estimated  right ventricular systolic pressure is 29.6 mmHg.  Laboratory Data:  High Sensitivity Troponin:   Recent Labs  Lab 11/19/19 2013 11/20/19 0742  TROPONINIHS 39* 46*      Chemistry Recent Labs  Lab 11/15/19 0426  11/19/19 2013  NA 141 139  K 3.9 3.9  CL 112* 109  CO2 20* 21*  GLUCOSE 110* 117*  BUN 19 22  CREATININE 1.17 1.39*  CALCIUM 8.4* 8.6*  GFRNONAA 58* 47*  GFRAA >60 55*  ANIONGAP 9 9    No results for input(s): PROT, ALBUMIN, AST, ALT, ALKPHOS, BILITOT in the last 168 hours. Hematology Recent Labs  Lab 11/15/19 0426 11/19/19 2013  WBC 12.9* 11.4*  RBC 3.32* 3.43*  HGB 10.3* 10.7*  HCT 32.6* 32.7*  MCV 98.2 95.3  MCH 31.0 31.2  MCHC 31.6 32.7  RDW 13.2 13.2  PLT 130* 101*   BNPNo results for input(s): BNP, PROBNP in the last 168 hours.  DDimer No results for input(s): DDIMER in the last 168 hours.   Radiology/Studies:  CT HEAD WO CONTRAST  Result Date: 11/19/2019 IMPRESSION: 1. No acute intracranial pathology. 2. Mild age-related atrophy and chronic microvascular ischemic changes. 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 Electronically Signed   By: Elgie Collard M.D.   On: 11/19/2019 20:57     Assessment and Plan:   Syncope with sinus pauses/high-grade conduction disease: -Patient admitted with syncope with documented 8 to 11-second sinus pause episodes in the setting of recent TAVR with prior documented RBBB with LAFB now with nonspecific IVCD and LBBB pattern -Transfer to [facility] for further evaluation and management by EP -Avoid AV nodal blocking agents -Patient to have 2 peripheral IVs in the right upper extremity, 1 in the left upper extremity -IV fluids at 50 mL/hr -N.p.o.  2.  CAD status post CABG with elevated troponin: -No symptoms of chest pain -Initial high-sensitivity troponin 39 with delta pending -Recent LHC 10/2019 with medical management as outlined above -ASA -Ranexa, continue for now per MD -Imdur  3.  PAF: -He remains in A. fib with controlled ventricular response with ventricular rates ranging from the 50s to 60s -Not on AV nodal blocking medications as outlined above -CHA2DS2-VASc 7 (CHF, HTN, age x 2, vascular disease,  CVA x 2) -Resume Eliquis when able post procedure -Hold amiodarone after discussion with MD  4.  HFpEF: -Diuresis as indicated in the periprocedural timeframe  5.  Aortic stenosis status post TAVR: -Management per structural heart team  6.  HLD: -Atorvastatin -Ezetimibe  7.  HTN: -Avoid AV nodal blocking medications -Hydralazine as needed  8.  Hypothyroidism: -TSH pending -PTA levothyroxine  9.  GERD: -Pantoprazole  Severity of Illness: The appropriate patient status for this patient is OBSERVATION. Observation status is judged to be reasonable and necessary in order to provide the required intensity of service to ensure the patient's safety. The  patient's presenting symptoms, physical exam findings, and initial radiographic and laboratory data in the context of their medical condition is felt to place them at decreased risk for further clinical deterioration. Furthermore, it is anticipated that the patient will be medically stable for discharge from the hospital within 2 midnights of admission. The following factors support the patient status of observation.   " The patient's presenting symptoms include as above. " The physical exam findings include as above. " The initial radiographic and laboratory data are as above.     For questions or updates, please contact [facility] Please consult [email or web address] for contact info under     Signed, Eula ListenRyan Mahari Vankirk, PA-C  11/20/2019 8:44 AM

## 2019-11-20 NOTE — ED Notes (Signed)
Pt denies any pain at this time. [facility].

## 2019-11-20 NOTE — Telephone Encounter (Signed)
Patient to have pacemaker by Dr. Graciela Husbands today

## 2019-11-20 NOTE — ED Notes (Signed)
Paper consent for transfer signed and given to secretary.

## 2019-11-21 ENCOUNTER — Ambulatory Visit (HOSPITAL_COMMUNITY): Payer: Medicare PPO

## 2019-11-21 ENCOUNTER — Encounter (HOSPITAL_COMMUNITY): Payer: Self-pay | Admitting: Internal Medicine

## 2019-11-21 DIAGNOSIS — K219 Gastro-esophageal reflux disease without esophagitis: Secondary | ICD-10-CM | POA: Diagnosis not present

## 2019-11-21 DIAGNOSIS — E039 Hypothyroidism, unspecified: Secondary | ICD-10-CM | POA: Diagnosis not present

## 2019-11-21 DIAGNOSIS — I480 Paroxysmal atrial fibrillation: Secondary | ICD-10-CM | POA: Diagnosis not present

## 2019-11-21 DIAGNOSIS — E7800 Pure hypercholesterolemia, unspecified: Secondary | ICD-10-CM | POA: Diagnosis not present

## 2019-11-21 DIAGNOSIS — I130 Hypertensive heart and chronic kidney disease with heart failure and stage 1 through stage 4 chronic kidney disease, or unspecified chronic kidney disease: Secondary | ICD-10-CM | POA: Diagnosis not present

## 2019-11-21 DIAGNOSIS — I499 Cardiac arrhythmia, unspecified: Secondary | ICD-10-CM | POA: Diagnosis not present

## 2019-11-21 DIAGNOSIS — N183 Chronic kidney disease, stage 3 unspecified: Secondary | ICD-10-CM | POA: Diagnosis not present

## 2019-11-21 DIAGNOSIS — R55 Syncope and collapse: Secondary | ICD-10-CM | POA: Diagnosis not present

## 2019-11-21 DIAGNOSIS — I442 Atrioventricular block, complete: Secondary | ICD-10-CM | POA: Diagnosis not present

## 2019-11-21 DIAGNOSIS — I2510 Atherosclerotic heart disease of native coronary artery without angina pectoris: Secondary | ICD-10-CM | POA: Diagnosis not present

## 2019-11-21 LAB — BASIC METABOLIC PANEL
Anion gap: 12 (ref 5–15)
BUN: 22 mg/dL (ref 8–23)
CO2: 21 mmol/L — ABNORMAL LOW (ref 22–32)
Calcium: 8.8 mg/dL — ABNORMAL LOW (ref 8.9–10.3)
Chloride: 110 mmol/L (ref 98–111)
Creatinine, Ser: 1.2 mg/dL (ref 0.61–1.24)
GFR calc Af Amer: >60 mL/min (ref >60)
GFR calc non Af Amer: 56 mL/min — ABNORMAL LOW (ref >60)
Glucose, Bld: 138 mg/dL — ABNORMAL HIGH (ref 70–99)
Potassium: 3.9 mmol/L (ref 3.5–5.1)
Sodium: 143 mmol/L (ref 135–145)

## 2019-11-21 IMAGING — DX DG CHEST 2V
2 series · 2 of 2 positions shown · non-contrast
Comparison: [DATE]

CLINICAL DATA: Pacemaker placement.  Cardiac arrhythmia

EXAM:
CHEST - 2 VIEW

[chest pa]
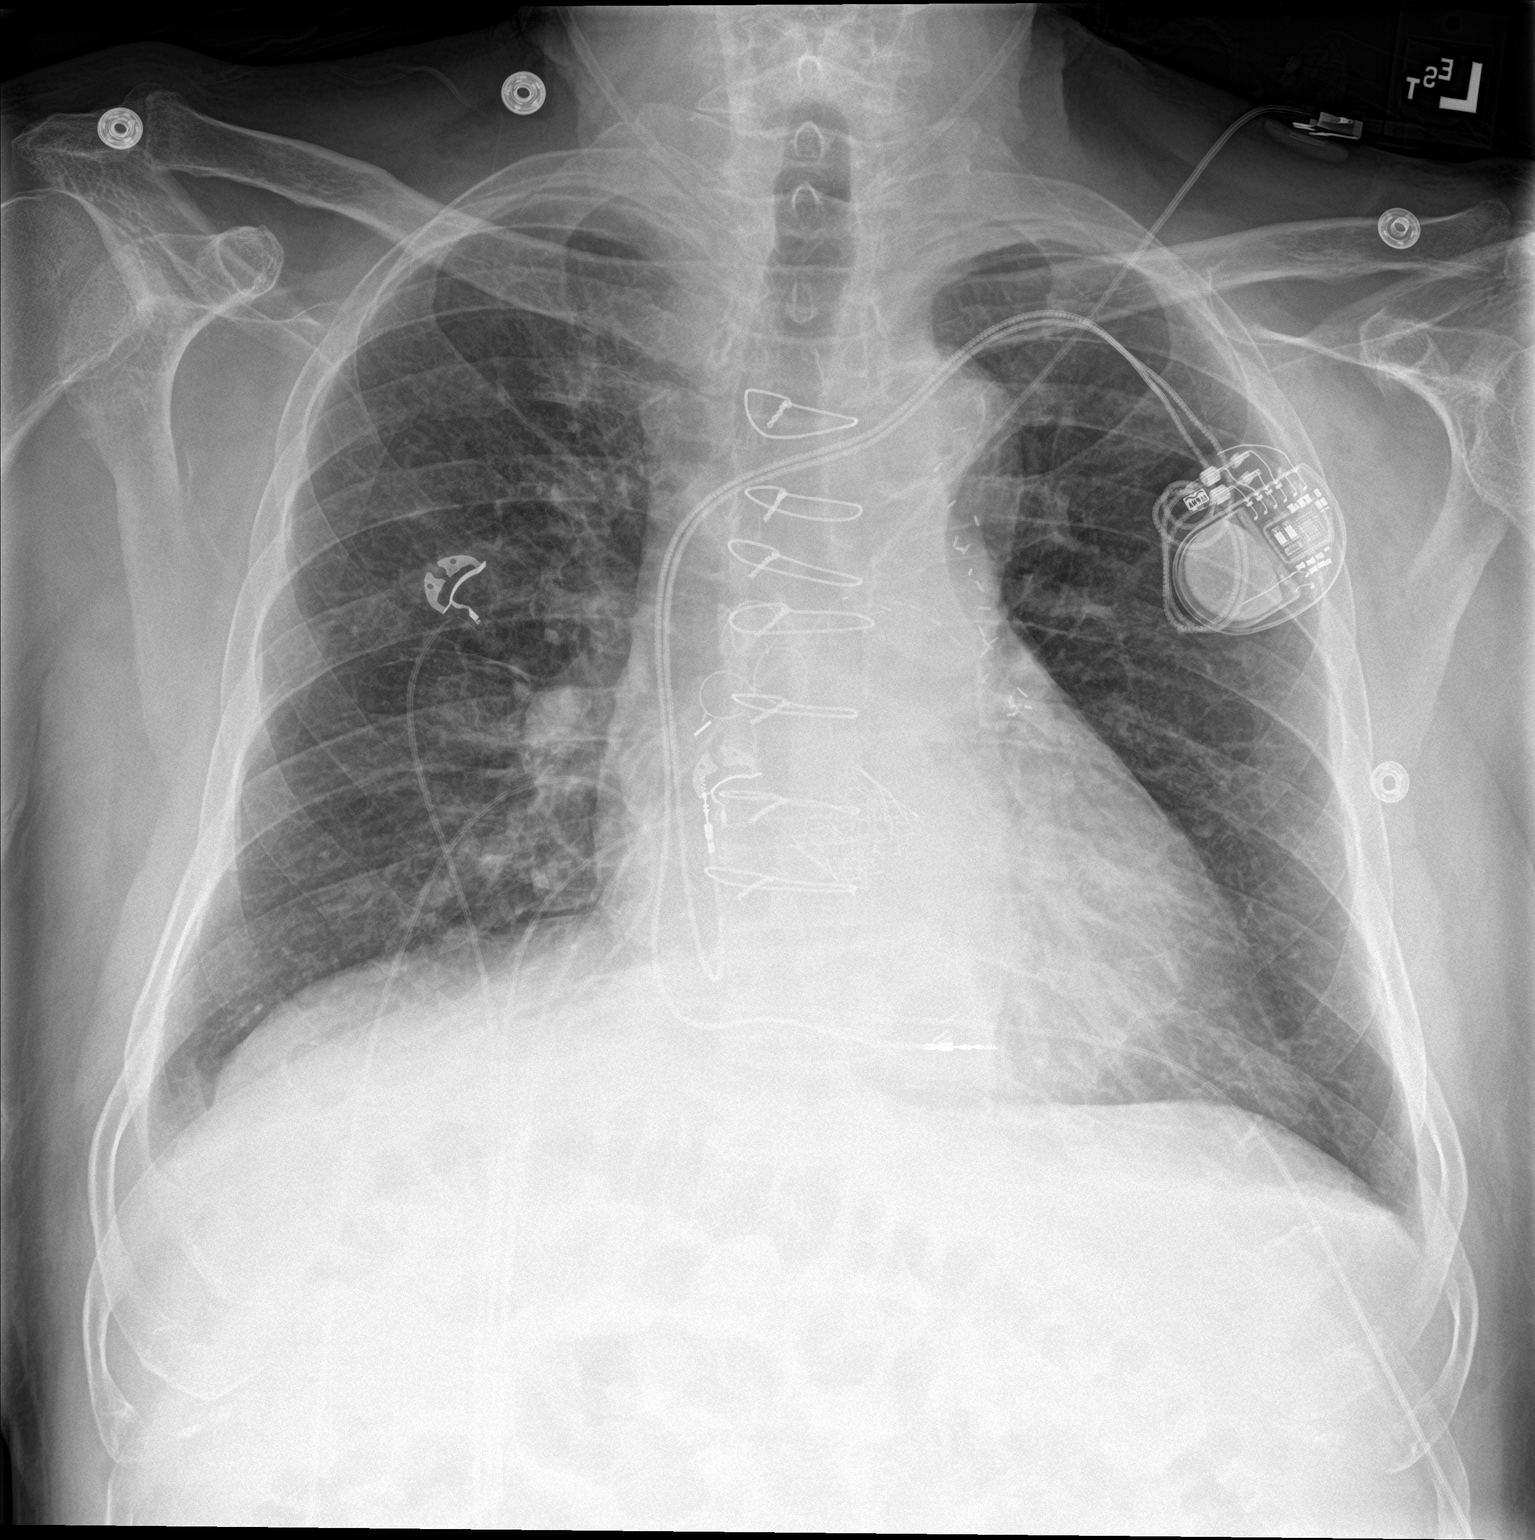

[chest lat]
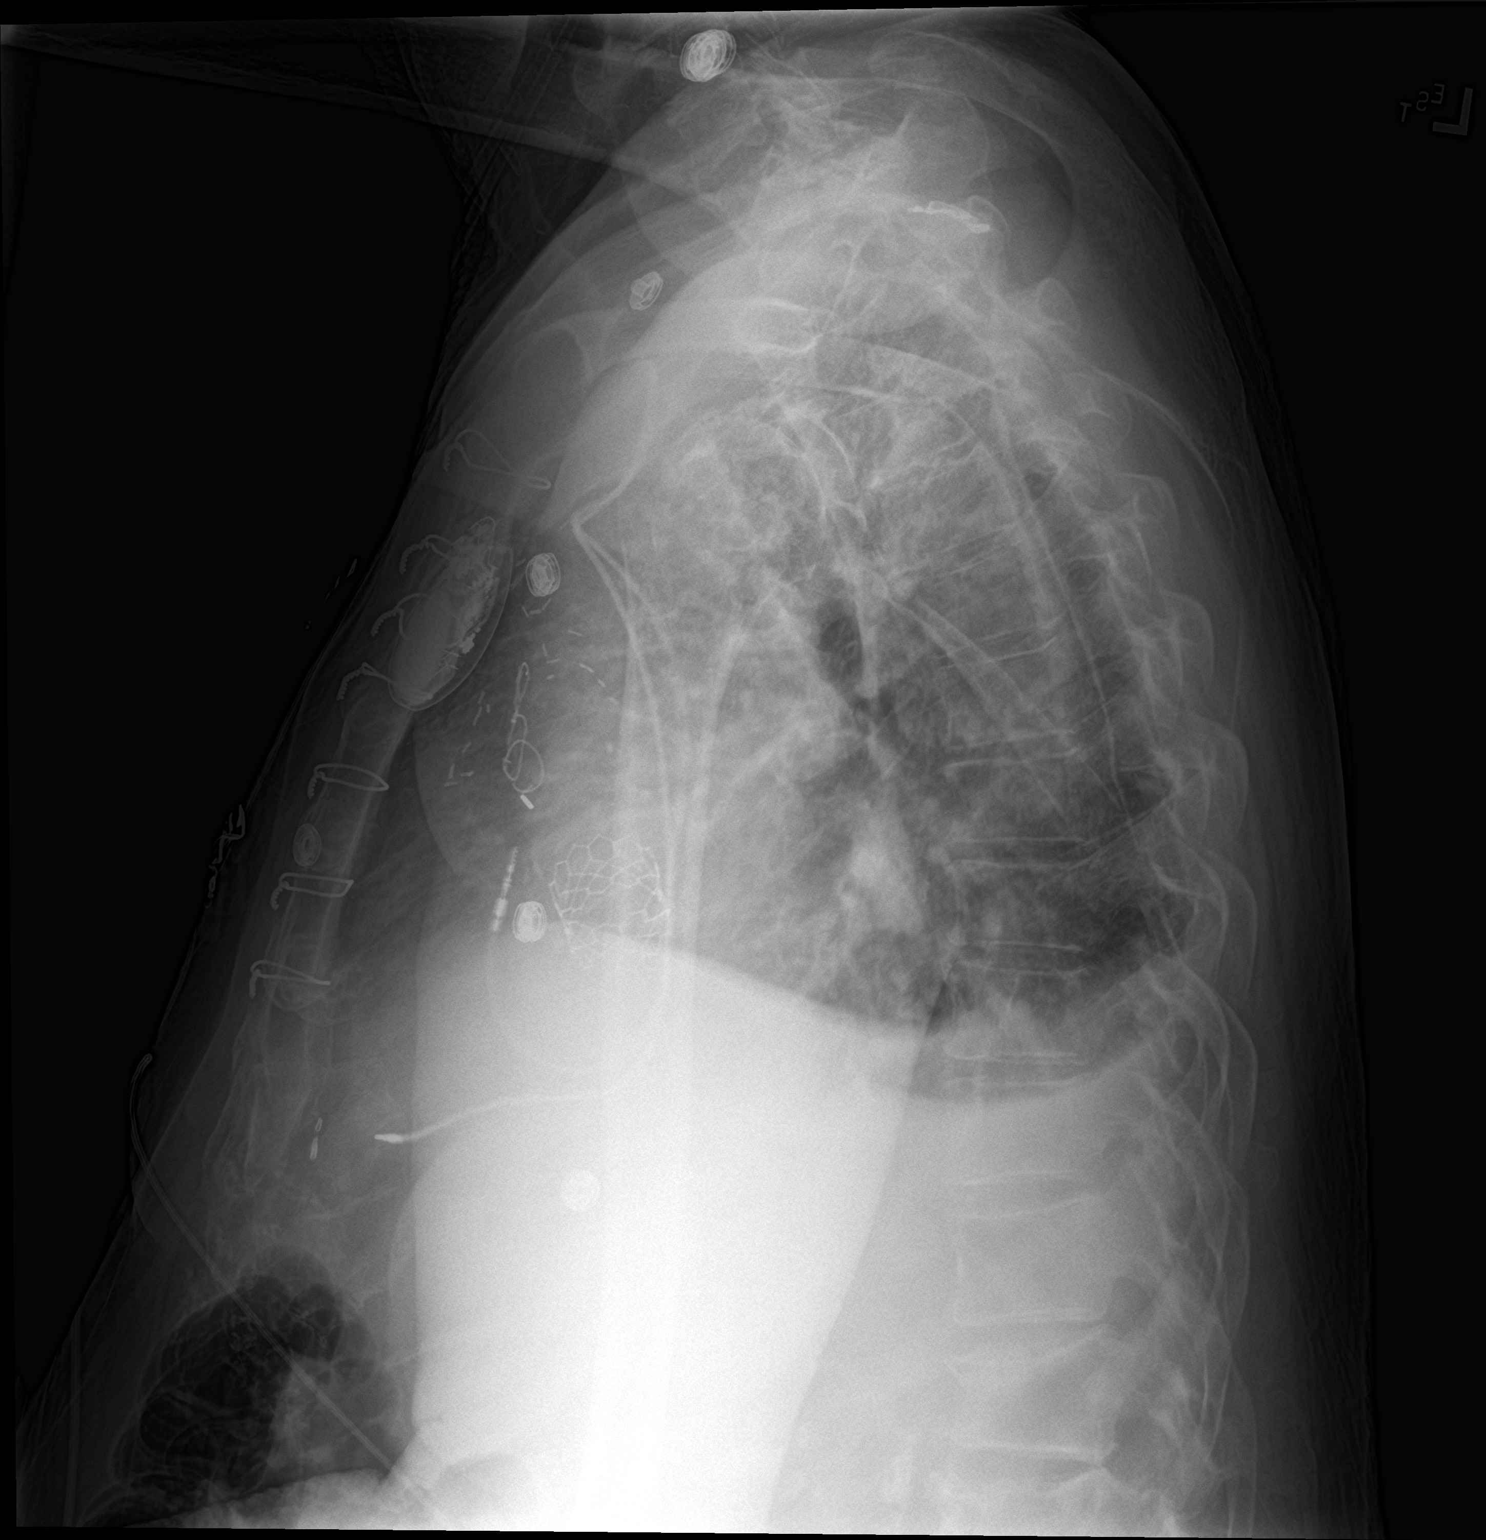

[2 of 2 positions shown; findings below may reference images not displayed]

FINDINGS: Pacemaker present on the left. Lead tips attached to right atrium
and right ventricle. No pneumothorax. Lungs are clear. Heart is
upper normal in size with pulmonary vascularity normal. Patient is
status post coronary artery bypass grafting and aortic valve
replacement. There is aortic atherosclerosis. No adenopathy. There
is anterior wedging of the L1 vertebral body.
IMPRESSION: Pacemaker leads attached to right atrium and right ventricle. No
pneumothorax. Lungs clear. Heart upper normal in size. Postoperative
changes noted.

Aortic Atherosclerosis ([DU]-[DU]).

## 2019-11-21 MED ORDER — APIXABAN 5 MG PO TABS
5.0000 mg | ORAL_TABLET | Freq: Two times a day (BID) | ORAL | 6 refills | Status: DC
Start: 2019-11-21 — End: 2020-06-25

## 2019-11-21 MED ORDER — FUROSEMIDE 10 MG/ML IJ SOLN: 80.0000 mg | Freq: Two times a day (BID) | INTRAMUSCULAR | Status: DC

## 2019-11-21 MED ORDER — FUROSEMIDE 10 MG/ML IJ SOLN
80.0000 mg | Freq: Two times a day (BID) | INTRAMUSCULAR | Status: DC
  Administered 2019-11-21: 80 mg via INTRAVENOUS
  Filled 2019-11-21: qty 8

## 2019-11-21 MED FILL — Lidocaine HCl Local Inj 1%: INTRAMUSCULAR | Qty: 60 | Status: AC

## 2019-11-21 NOTE — Plan of Care (Signed)
Plan of care initiated.

## 2019-11-21 NOTE — Discharge Instructions (Signed)
    Supplemental Discharge Instructions for  Pacemaker/Defibrillator Patients  Activity No heavy lifting or vigorous activity with your left/right arm for 6 to 8 weeks.  Do not raise your left/right arm above your head for one week.  Gradually raise your affected arm as drawn below.              11/24/2019                11/25/2019                11/26/2019               11/27/2019 __   NO DRIVING until cleared to at your wound check visit  WOUND CARE - Keep the wound area clean and dry.  Do not get this area wet for 24 hours, you may shower on   11/21/2019 evening  . - Do not remove/peel off the DERMABOND (skin glue).  No bandage is needed on the site.  DO  NOT apply any creams, oils, or ointments to the wound area. - If you notice any drainage or discharge from the wound, any swelling or bruising at the site, or you develop a fever > 101? F after you are discharged home, call the office at once.  Special Instructions - You are still able to use cellular telephones; use the ear opposite the side where you have your pacemaker/defibrillator.  Avoid carrying your cellular phone near your device. - When traveling through airports, show security personnel your identification card to avoid being screened in the metal detectors.  Ask the security personnel to use the hand wand. - Avoid arc welding equipment, MRI testing (magnetic resonance imaging), TENS units (transcutaneous nerve stimulators).  Call the office for questions about other devices. - Avoid electrical appliances that are in poor condition or are not properly grounded. - Microwave ovens are safe to be near or to operate.   You have an appointment set up with the Atrial Fibrillation Clinic.  Multiple studies have shown that being followed by a dedicated atrial fibrillation clinic in addition to the standard care you receive from your other physicians improves health. We believe that enrollment in the atrial fibrillation clinic will allow  [place] to better care for you.   The phone number to the Atrial Fibrillation Clinic is [phone]. The clinic is staffed Monday through Friday from 8:30am to 5pm.  Parking Directions: The clinic is located in the Heart and Vascular Building connected to [facility]. 1)From [address] turn on to [address] and go to the 3rd entrance  (Heart and Vascular entrance) on the right. 2)Look to the right for Heart &Vascular Parking Garage. 3)A code for the entrance is required please call to get the code for June.   4)Take the elevators to the 1st floor. Registration is in the room with the glass walls at the end of the hallway.  If you have any trouble parking or locating the clinic, please don't hesitate to call [phone].

## 2019-11-22 ENCOUNTER — Telehealth: Payer: Self-pay | Admitting: Physician Assistant

## 2019-11-22 ENCOUNTER — Telehealth: Payer: Self-pay | Admitting: Cardiovascular Disease

## 2019-11-22 ENCOUNTER — Ambulatory Visit: Payer: Medicare PPO | Admitting: Physician Assistant

## 2019-11-22 NOTE — Telephone Encounter (Signed)
Paged by wife who is wondering if he should take both aspirin and Eliquis. Both are on hold until Friday. I reviewed the recent workup. He had recent valve surgery by Dr. Laneta Simmers, he was restarted on aspirin after the valve procedure on top of Eliquis.

## 2019-11-22 NOTE — Telephone Encounter (Signed)
New Message  Dois Davenport with [facility] is calling because the patient is no longer wearing his Zio because he now has a pacemaker. She was calling to advise. Any questions please call and use reference number [phone]

## 2019-11-23 ENCOUNTER — Other Ambulatory Visit: Payer: Self-pay | Admitting: Physician Assistant

## 2019-11-23 NOTE — Progress Notes (Signed)
Pt started on full dose Eliquis 5mg  BID after PPM. Will plan to stop aspirin now.

## 2019-11-29 ENCOUNTER — Ambulatory Visit (HOSPITAL_COMMUNITY)
Admit: 2019-11-29 | Discharge: 2019-11-29 | Disposition: A | Discharge status: Home / Self Care | Payer: Medicare PPO | Source: Ambulatory Visit | Attending: Physician Assistant | Admitting: Physician Assistant

## 2019-11-29 ENCOUNTER — Encounter (HOSPITAL_COMMUNITY): Payer: Self-pay | Admitting: Physician Assistant

## 2019-11-29 ENCOUNTER — Other Ambulatory Visit: Payer: Self-pay

## 2019-11-29 VITALS — BP 138/78 | HR 70 | Ht 68.0 in | Wt 184.2 lb

## 2019-11-29 DIAGNOSIS — I5030 Unspecified diastolic (congestive) heart failure: Secondary | ICD-10-CM | POA: Diagnosis not present

## 2019-11-29 DIAGNOSIS — Z950 Presence of cardiac pacemaker: Secondary | ICD-10-CM | POA: Diagnosis not present

## 2019-11-29 DIAGNOSIS — Z79890 Hormone replacement therapy (postmenopausal): Secondary | ICD-10-CM | POA: Diagnosis not present

## 2019-11-29 DIAGNOSIS — I130 Hypertensive heart and chronic kidney disease with heart failure and stage 1 through stage 4 chronic kidney disease, or unspecified chronic kidney disease: Secondary | ICD-10-CM | POA: Diagnosis not present

## 2019-11-29 DIAGNOSIS — I350 Nonrheumatic aortic (valve) stenosis: Secondary | ICD-10-CM | POA: Diagnosis not present

## 2019-11-29 DIAGNOSIS — Z7901 Long term (current) use of anticoagulants: Secondary | ICD-10-CM | POA: Diagnosis not present

## 2019-11-29 DIAGNOSIS — R001 Bradycardia, unspecified: Secondary | ICD-10-CM | POA: Diagnosis not present

## 2019-11-29 DIAGNOSIS — Z952 Presence of prosthetic heart valve: Secondary | ICD-10-CM | POA: Diagnosis not present

## 2019-11-29 DIAGNOSIS — Z951 Presence of aortocoronary bypass graft: Secondary | ICD-10-CM | POA: Diagnosis not present

## 2019-11-29 DIAGNOSIS — D508 Other iron deficiency anemias: Secondary | ICD-10-CM | POA: Insufficient documentation

## 2019-11-29 DIAGNOSIS — I25810 Atherosclerosis of coronary artery bypass graft(s) without angina pectoris: Secondary | ICD-10-CM | POA: Insufficient documentation

## 2019-11-29 DIAGNOSIS — E7800 Pure hypercholesterolemia, unspecified: Secondary | ICD-10-CM | POA: Diagnosis not present

## 2019-11-29 DIAGNOSIS — N183 Chronic kidney disease, stage 3 unspecified: Secondary | ICD-10-CM | POA: Diagnosis not present

## 2019-11-29 DIAGNOSIS — Z823 Family history of stroke: Secondary | ICD-10-CM | POA: Diagnosis not present

## 2019-11-29 DIAGNOSIS — E785 Hyperlipidemia, unspecified: Secondary | ICD-10-CM | POA: Insufficient documentation

## 2019-11-29 DIAGNOSIS — Z79899 Other long term (current) drug therapy: Secondary | ICD-10-CM | POA: Insufficient documentation

## 2019-11-29 DIAGNOSIS — I4819 Other persistent atrial fibrillation: Secondary | ICD-10-CM | POA: Diagnosis not present

## 2019-11-29 DIAGNOSIS — Z8249 Family history of ischemic heart disease and other diseases of the circulatory system: Secondary | ICD-10-CM | POA: Diagnosis not present

## 2019-11-29 DIAGNOSIS — K219 Gastro-esophageal reflux disease without esophagitis: Secondary | ICD-10-CM | POA: Insufficient documentation

## 2019-11-29 DIAGNOSIS — D6869 Other thrombophilia: Secondary | ICD-10-CM | POA: Diagnosis not present

## 2019-11-29 DIAGNOSIS — G4730 Sleep apnea, unspecified: Secondary | ICD-10-CM | POA: Diagnosis not present

## 2019-11-29 NOTE — Progress Notes (Signed)
Primary Care Physician: Chilton Greathouse, MD Primary Cardiologist: Dr Tresa Endo Structural Heart: Dr Clifton James Primary Electrophysiologist: Dr Graciela Husbands Referring Physician: Dr Keane Jorge Cherry is a 84 y.o. male with a history of CAD status four-vessel CABG in 1994, severe aortic stenosis status post TAVR on 11/14/2019, HFpEF, CVA, CKD stage III, prior GI bleed, HTN, HLD, GERD, and persistent atrial fibrillation who presents for follow up in the [facility]rial Fibr[facility]. Patient is on Eliquis for a CHADS2VASC score of 6. Patient was found to have afib post TAVR and a Zio patch was placed. He subsequently suffered 2 syncopal events.  He was brought via EMS after the last to [facility]. Review of his Zio patch showedmultiple pauses lasting from 8 to 11 seconds in duration and he underwent PPM implantation with Dr Graciela Husbands on 11/20/19. Since his hospitalization, he reports that he has done reasonably well. He does have symptoms of fatigue and dyspnea with exertion but admits he has not been very active in recent years. He is in rate controlled afib today.  Today, he denies symptoms of palpitations, chest pain, orthopnea, PND, lower extremity edema, dizziness, presyncope, syncope, snoring, daytime somnolence, bleeding, or neurologic sequela. The patient is tolerating medications without difficulties and is otherwise without complaint today.    Atrial Fibrillation Risk Factors:  he does not have symptoms or diagnosis of sleep apnea.   he has a BMI of Body mass index is 28.01 kg/m.[name] Filed Weights   11/29/19 1353  Weight: 83.6 kg    Family History  Problem Relation Age of Onset  . Cancer Mother   . Heart disease Father        died age 71  . Stroke Maternal Grandmother   . Cancer Maternal Grandfather   . Allergic rhinitis Neg Hx   . Angioedema Neg Hx   . Asthma Neg Hx   . Atopy Neg Hx   . Eczema Neg Hx   . Immunodeficiency Neg Hx   . Urticaria Neg Hx      Atrial Fibrillation  Management history:  Previous antiarrhythmic drugs: amiodarone Previous cardioversions: none Previous ablations: none CHADS2VASC score: 6 Anticoagulation history: Eliquis   Past Medical History:  Diagnosis Date  . Arthritis    "just a touch in my hands" (11/24/2016)  . Asthma   . Atherosclerosis of renal artery (HCC)    RENAL DOPPLER, 12/10/2011 - Left renal artery demonstrated narrowing with elevated velocities consistent with a 1-59% diameter reduction  . CKD (chronic kidney disease) stage 3, GFR 30-59 ml/min    "stable now since they backed off the water pills" (11/24/2016)  . Coronary artery disease    a. 1994 s/p CABG x 4 (LIMA-LAD, VG->D2, VG->OM, VG->RCA); b. 02/2004 PCI SVG-D2 (3.5x16 Taxus DES). VG->RCA 100. Sev apical LAD dzs distal to LIMA insertion; c. 11/2016 Cath: LAD 95p/135m, D2 100ost, LCX 100ost, 70p/m, RCA 100p/m, RPDA fills via L->R collats. VG->RCA 100, VG->D2 20 ost, patent prox stent, 62m, LIMA->LAD ok, VG->OM3 20p. EF 55%-->Med Rx.  . GERD (gastroesophageal reflux disease)   . High cholesterol   . History of lower GI bleeding    a. 09/2014 GIB due to diverticulosis/diverticulitis.  . Iron deficiency anemia   . Labile Hypertension   . Moderate aortic stenosis    a. 10/2011 Echo:  EF >55%, mild-mod TR, mild-mod AS, mod Ca2+ of AoV leaflets; b. 07/2016 Echo: EF 60-65%, no rwma, Gr1 DD, mod AS [(S) mean grad , peak grad . Valve  area (VTI): 1.33cm^2, (Vmax) 1.44cm^2. Mild MR]; c. 02/2018 Echo: EF 55-60%, no rwma, GR1 DD, Mod AS [Peak Vel (S): 319cm/s, Mean grad (S) , peak grad (S) 29mmHg].  [name] PAF (paroxysmal atrial fibrillation) (HCC)    a. CHA2DS2VASc = 4-->Eliquis.  . S/P TAVR (transcatheter aortic valve replacement) 11/14/2019   s/p TAVR with a 26 mm Edwards S3U via the TF approach by Drs Clifton James & Bartle  . Sleep apnea    Past Surgical History:  Procedure Laterality Date  . CARDIAC CATHETERIZATION  02/27/2004   Coronary intervention and medical  management  . CARDIAC CATHETERIZATION  11/24/2016  . CATARACT EXTRACTION W/ INTRAOCULAR LENS IMPLANT Left   . CORONARY ANGIOPLASTY  1994 X 2   "before bypass surgery"  . CORONARY ANGIOPLASTY WITH STENT PLACEMENT  03/04/2004   SVG supplying the diagonal vessel stented with a 3.5x17mm Taxus stent post dilated to 4.0 mm  . CORONARY ARTERY BYPASS GRAFT  1994   "CABG X4"  . INGUINAL HERNIA REPAIR Right   . PACEMAKER IMPLANT N/A 11/20/2019   Procedure: PACEMAKER IMPLANT;  Surgeon: Duke Salvia, MD;  Location: [facility] INVASIVE CV LAB;  Service: Cardiovascular;  Laterality: N/A;  . RIGHT HEART CATH AND CORONARY/GRAFT ANGIOGRAPHY N/A 11/24/2016   Procedure: Right Heart Cath and Coronary/Graft Angiography;  Surgeon: Lennette Bihari, MD;  Location: [facility] INVASIVE CV LAB;  Service: Cardiovascular;  Laterality: N/A;  . RIGHT/LEFT HEART CATH AND CORONARY/GRAFT ANGIOGRAPHY N/A 10/18/2019   Procedure: RIGHT/LEFT HEART CATH AND CORONARY/GRAFT ANGIOGRAPHY;  Surgeon: Lennette Bihari, MD;  Location: [facility] INVASIVE CV LAB;  Service: Cardiovascular;  Laterality: N/A;  . TEE WITHOUT CARDIOVERSION N/A 11/14/2019   Procedure: TRANSESOPHAGEAL ECHOCARDIOGRAM (TEE);  Surgeon: Kathleene Hazel, MD;  Location: [facility] INVASIVE CV LAB;  Service: Open Heart Surgery;  Laterality: N/A;  . TONSILLECTOMY AND ADENOIDECTOMY    . TRANSCATHETER AORTIC VALVE REPLACEMENT, TRANSFEMORAL N/A 11/14/2019   Procedure: TRANSCATHETER AORTIC VALVE REPLACEMENT, TRANSFEMORAL;  Surgeon: Kathleene Hazel, MD;  Location: [facility] INVASIVE CV LAB;  Service: Open Heart Surgery;  Laterality: N/A;    Current Outpatient Medications  Medication Sig Dispense Refill  . albuterol (VENTOLIN HFA) 108 (90 Base) MCG/ACT inhaler INHALE TWO PUFFS EVERY 4-6 HOURS AS NEEDED FOR COUGH OR WHEEZE 8.5 g 1  . amiodarone (PACERONE) 200 MG tablet Take 1 tablet (200 mg total) by mouth daily. 90 tablet 3  . amLODipine (NORVASC) 2.5 MG tablet Take 1 tablet (2.5 mg total) by mouth 2  (two) times daily. NEED OV. 180 tablet 0  . apixaban (ELIQUIS) 5 MG TABS tablet Take 1 tablet (5 mg total) by mouth 2 (two) times daily. 60 tablet 6  . atorvastatin (LIPITOR) 20 MG tablet Take 1 tablet (20 mg total) by mouth at bedtime. 30 tablet 0  . docusate sodium (COLACE) 100 MG capsule Take 100 mg by mouth daily.     [name] ezetimibe (ZETIA) 10 MG tablet TAKE 1 TABLET BY MOUTH EVERY DAY 90 tablet 2  . fenofibrate (TRICOR) 145 MG tablet Take 0.5 tablets (72.5 mg total) by mouth daily. 90 tablet 1  . fluticasone (FLONASE) 50 MCG/ACT nasal spray Place 2 sprays into both nostrils daily as needed for allergies or rhinitis.     . fluticasone (FLOVENT HFA) 110 MCG/ACT inhaler Inhale 1 puff into the lungs 3 (three) times daily as needed (for seasonal flares).     [name] ipratropium (ATROVENT) 0.06 % nasal spray Can use two sprays in each nostril every six hours as  needed to dry up nose. 15 mL 5  . isosorbide mononitrate (IMDUR) 60 MG 24 hr tablet TAKE 1 AND 1/2 TABS (90MG ) BY MOUTH EVERY MORNING AND 1/2 TAB EVERY EVENING 180 tablet 3  . levothyroxine (SYNTHROID) 25 MCG tablet Take 25 mcg by mouth daily before breakfast.     . loratadine (CLARITIN) 10 MG tablet Take 10 mg by mouth every evening.     . Multiple Vitamins-Minerals (CENTRUM SILVER 50+MEN) TABS Take 1 tablet by mouth at bedtime.    . nitroGLYCERIN (NITROLINGUAL) 0.4 MG/SPRAY spray PLACE 1 SPRAY UNDER THE TONGUE EVERY 5 (FIVE) MINUTES X 3 DOSES AS NEEDED FOR CHEST PAIN. 12 g 1  . Omega-3 Fatty Acids (FISH OIL) 1000 MG CAPS Take 1,000 mg by mouth 2 (two) times daily.     . pantoprazole (PROTONIX) 40 MG tablet TAKE 1 TABLET BY MOUTH EVERY DAY 90 tablet 1  . ranolazine (RANEXA) 500 MG 12 hr tablet TAKE 1 TABLET BY MOUTH TWICE A DAY 180 tablet 3  . vitamin C (ASCORBIC ACID) 500 MG tablet Take 500 mg by mouth at bedtime.     . vitamin E 400 UNIT capsule Take 400 Units by mouth daily.    zinc gluconate 50 MG tablet Take 50 mg by mouth 2 (two) times a  week.     No current facility-administered medications for this encounter.    Allergies  Allergen Reactions  . Altace [Ramipril] Swelling and Other (See Comments)    Mouth swelling  . Mucinex [Guaifenesin Er] Hives, Swelling and Other (See Comments)    Mouth swelling  . Contrast Media [Iodinated Diagnostic Agents] Other (See Comments)    Made eyes change each time    Social History   Socioeconomic History  . Marital status: Married    Spouse name: Not on file  . Number of children: 2  . Years of education: Not on file  . Highest education level: Not on file  Occupational History  . Not on file  Tobacco Use  . Smoking status: Never Smoker  . Smokeless tobacco: Never Used  Vaping Use  . Vaping Use: Never used  Substance and Sexual Activity  . Alcohol use: No    Alcohol/week: 0.0 standard drinks  . Drug use: No  . Sexual activity: Not Currently  Other Topics Concern  . Not on file  Social History Narrative   Lives at home in [place] with wife.  Retired from [employer].     Social Determinants of Health   Financial Resource Strain:   . Difficulty of Paying Living Expenses:   Food Insecurity:   . Worried About [employer] in the Last Year:   . Programme researcher, broadcasting/film/video in the Last Year:   Transportation Needs:   . Barista (Medical):   Freight forwarder Lack of Transportation (Non-Medical):   Physical Activity:   . Days of Exercise per Week:   . Minutes of Exercise per Session:   Stress:   . Feeling of Stress :   Social Connections:   . Frequency of Communication with Friends and Family:   . Frequency of Social Gatherings with Friends and Family:   . Attends Religious Services:   . Active Member of Clubs or Organizations:   . Attends [name] Meetings:   Banker Marital Status:   Intimate Partner Violence:   . Fear of Current or Ex-Partner:   . Emotionally Abused:   [name] Physically Abused:   .  Sexually Abused:      ROS- All systems are  reviewed and negative except as per the HPI above.  Physical Exam: Vitals:   11/29/19 1353  BP: 138/78  Pulse: 70  Weight: 83.6 kg  Height: 5\' 8"  (1.727 m)    GEN- The patient is well appearing elderly male, alert and oriented x 3 today.   Head- normocephalic, atraumatic Eyes-  Sclera clear, conjunctiva pink Ears- hearing intact Oropharynx- clear Neck- supple  Lungs- Clear to ausculation bilaterally, normal work of breathing Heart- irregular rate and rhythm, no murmurs, rubs or gallops  GI- soft, NT, ND, + BS Extremities- no clubbing, cyanosis, or edema MS- no significant deformity or atrophy Skin- no rash or lesion Psych- euthymic mood, full affect Neuro- strength and sensation are intact  Wt Readings from Last 3 Encounters:  11/29/19 83.6 kg  11/21/19 86.5 kg  11/15/19 88.6 kg    EKG today demonstrates afib HR 70, PVCs, LBBB, QRS 172, QTc 542  Echo 11/15/19 demonstrated  1. Day 1 post TAVR, no paravalvular leak. Normal transaortic gradients  peak/mean 17/10 mmHg. LVEF improved to 55-60%.  2. Left ventricular ejection fraction, by estimation, is 55 to 60%. The  left ventricle has normal function. There is moderate concentric left  ventricular hypertrophy.  3. Trivial mitral valve regurgitation.  4. The aortic valve has been repaired/replaced. There is a 26 mm Ultra,  stented (TAVR) valve present in the aortic position. Procedure Date:  11/14/2019. Echo findings are consistent with normal structure and  function of the aortic valve prosthesis.  Aortic valve mean gradient measures 10.0 mmHg.  5. There is normal pulmonary artery systolic pressure. The estimated  right ventricular systolic pressure is 29.6 mmHg.   Epic records are reviewed at length today  CHA2DS2-VASc Score = 6  The patient's score is based upon: CHF History: 0 HTN History: 1 Age : 2 Diabetes History: 0 Stroke History: 2 Vascular Disease History: 1 Gender: 0      ASSESSMENT AND PLAN: 1.  Persistent Atrial Fibrillation (ICD10:  I48.19) The patient's CHA2DS2-VASc score is 6, indicating a 9.7% annual risk of stroke. We discussed therapeutic options today including a trial of SR with TEE/DCCV.  Patient would like to take time to consider as he is going out of town next week. Will reassess once he gets back.  He is rate controlled. Continue amiodarone 200 mg daily Continue Eliquis 5 mg BID  2. Secondary Hypercoagulable State (ICD10:  D68.69) The patient is at significant risk for stroke/thromboembolism based upon his CHA2DS2-VASc Score of 6.  Continue Apixaban (Eliquis).   3.CAD S/p CABG 1994 No anginal symptoms.  4. Aortic stenosis S/p TAVR 11/14/19.  5. HTN Stable, no changes today.  6. CHB/symptomatic bradycardia S/p PPM, followed by Dr Graciela HusbandsKlein and the device clinic.   Follow up in the AF clinic the week of 8/2 to discuss DCCV.   Jorja Loaicky Zacariah Belue PA-C Afib Clinic [facility] [address] [place], [place] [address] [phone] 11/29/2019 2:44 PM

## 2019-12-05 ENCOUNTER — Other Ambulatory Visit: Payer: Self-pay

## 2019-12-05 ENCOUNTER — Ambulatory Visit (INDEPENDENT_AMBULATORY_CARE_PROVIDER_SITE_OTHER): Payer: Medicare PPO | Admitting: Emergency Medicine

## 2019-12-05 DIAGNOSIS — R001 Bradycardia, unspecified: Secondary | ICD-10-CM | POA: Diagnosis not present

## 2019-12-05 LAB — CUP PACEART INCLINIC DEVICE CHECK
Battery Remaining Longevity: 166 mo
Battery Voltage: 3.22 V
Brady Statistic RA Percent Paced: 0.11 %
Brady Statistic RV Percent Paced: 32.43 %
Date Time Interrogation Session: 20210727143100
Implantable Lead Implant Date: 20210712
Implantable Lead Implant Date: 20210712
Implantable Lead Location: 753859
Implantable Lead Location: 753860
Implantable Lead Model: 5076
Implantable Lead Model: 5076
Implantable Pulse Generator Implant Date: 20210712
Lead Channel Impedance Value: 266 Ohm
Lead Channel Impedance Value: 323 Ohm
Lead Channel Impedance Value: 399 Ohm
Lead Channel Impedance Value: 418 Ohm
Lead Channel Pacing Threshold Amplitude: 1.125 V
Lead Channel Pacing Threshold Pulse Width: 0.4 ms
Lead Channel Sensing Intrinsic Amplitude: 0.75 mV
Lead Channel Sensing Intrinsic Amplitude: 9 mV
Lead Channel Setting Pacing Amplitude: 3.5 V
Lead Channel Setting Pacing Amplitude: 3.5 V
Lead Channel Setting Pacing Pulse Width: 0.4 ms
Lead Channel Setting Sensing Sensitivity: 0.9 mV

## 2019-12-05 NOTE — Progress Notes (Signed)
Wound check appointment. Steri-strips removed. Wound without redness or edema. Incision edges approximated, wound well healed. Normal device function. Thresholds, sensing, and impedances consistent with implant measurements. Device programmed at 3.5V/auto capture programmed on for extra safety margin until 3 month visit. Histogram distribution appropriate for patient and level of activity. AT/AF burden 100%, episode started 11/21/19, eliquis. Has AF clinic apt. 12/11/19. No high ventricular rates noted. Patient educated about wound care, arm mobility, lifting restrictions. ROV in 3 months with implanting physician. Next home remote 02/19/20.

## 2019-12-05 NOTE — Patient Instructions (Signed)
Please call device clinic if you notice any increased swelling, pain, drainage, fever or chills.  Device Clinic [phone].

## 2019-12-09 ENCOUNTER — Other Ambulatory Visit: Payer: Self-pay | Admitting: Cardiovascular Disease

## 2019-12-11 ENCOUNTER — Ambulatory Visit (HOSPITAL_COMMUNITY)
Admission: RE | Admit: 2019-12-11 | Discharge: 2019-12-11 | Disposition: A | Discharge status: Home / Self Care | Payer: Medicare PPO | Source: Ambulatory Visit | Attending: Physician Assistant | Admitting: Physician Assistant

## 2019-12-11 ENCOUNTER — Other Ambulatory Visit: Payer: Self-pay

## 2019-12-11 VITALS — BP 160/84 | HR 77 | Ht 68.0 in | Wt 186.0 lb

## 2019-12-11 DIAGNOSIS — N183 Chronic kidney disease, stage 3 unspecified: Secondary | ICD-10-CM | POA: Insufficient documentation

## 2019-12-11 DIAGNOSIS — E7800 Pure hypercholesterolemia, unspecified: Secondary | ICD-10-CM | POA: Insufficient documentation

## 2019-12-11 DIAGNOSIS — I350 Nonrheumatic aortic (valve) stenosis: Secondary | ICD-10-CM | POA: Diagnosis not present

## 2019-12-11 DIAGNOSIS — Z950 Presence of cardiac pacemaker: Secondary | ICD-10-CM | POA: Insufficient documentation

## 2019-12-11 DIAGNOSIS — E785 Hyperlipidemia, unspecified: Secondary | ICD-10-CM | POA: Diagnosis not present

## 2019-12-11 DIAGNOSIS — I2510 Atherosclerotic heart disease of native coronary artery without angina pectoris: Secondary | ICD-10-CM | POA: Diagnosis not present

## 2019-12-11 DIAGNOSIS — Z951 Presence of aortocoronary bypass graft: Secondary | ICD-10-CM | POA: Insufficient documentation

## 2019-12-11 DIAGNOSIS — Z823 Family history of stroke: Secondary | ICD-10-CM | POA: Insufficient documentation

## 2019-12-11 DIAGNOSIS — Z79890 Hormone replacement therapy (postmenopausal): Secondary | ICD-10-CM | POA: Diagnosis not present

## 2019-12-11 DIAGNOSIS — I4819 Other persistent atrial fibrillation: Secondary | ICD-10-CM | POA: Diagnosis not present

## 2019-12-11 DIAGNOSIS — Z7901 Long term (current) use of anticoagulants: Secondary | ICD-10-CM | POA: Diagnosis not present

## 2019-12-11 DIAGNOSIS — I447 Left bundle-branch block, unspecified: Secondary | ICD-10-CM | POA: Insufficient documentation

## 2019-12-11 DIAGNOSIS — I5030 Unspecified diastolic (congestive) heart failure: Secondary | ICD-10-CM | POA: Insufficient documentation

## 2019-12-11 DIAGNOSIS — I701 Atherosclerosis of renal artery: Secondary | ICD-10-CM | POA: Insufficient documentation

## 2019-12-11 DIAGNOSIS — Z79899 Other long term (current) drug therapy: Secondary | ICD-10-CM | POA: Insufficient documentation

## 2019-12-11 DIAGNOSIS — R001 Bradycardia, unspecified: Secondary | ICD-10-CM | POA: Diagnosis not present

## 2019-12-11 DIAGNOSIS — Z955 Presence of coronary angioplasty implant and graft: Secondary | ICD-10-CM | POA: Insufficient documentation

## 2019-12-11 DIAGNOSIS — Z8249 Family history of ischemic heart disease and other diseases of the circulatory system: Secondary | ICD-10-CM | POA: Diagnosis not present

## 2019-12-11 DIAGNOSIS — D6869 Other thrombophilia: Secondary | ICD-10-CM | POA: Diagnosis not present

## 2019-12-11 DIAGNOSIS — I130 Hypertensive heart and chronic kidney disease with heart failure and stage 1 through stage 4 chronic kidney disease, or unspecified chronic kidney disease: Secondary | ICD-10-CM | POA: Diagnosis not present

## 2019-12-11 DIAGNOSIS — K219 Gastro-esophageal reflux disease without esophagitis: Secondary | ICD-10-CM | POA: Diagnosis not present

## 2019-12-11 LAB — BASIC METABOLIC PANEL
Anion gap: 9 (ref 5–15)
BUN: 17 mg/dL (ref 8–23)
CO2: 23 mmol/L (ref 22–32)
Calcium: 8.9 mg/dL (ref 8.9–10.3)
Chloride: 109 mmol/L (ref 98–111)
Creatinine, Ser: 1.39 mg/dL — ABNORMAL HIGH (ref 0.61–1.24)
GFR calc Af Amer: 55 mL/min — ABNORMAL LOW (ref >60)
GFR calc non Af Amer: 47 mL/min — ABNORMAL LOW (ref >60)
Glucose, Bld: 98 mg/dL (ref 70–99)
Potassium: 4 mmol/L (ref 3.5–5.1)
Sodium: 141 mmol/L (ref 135–145)

## 2019-12-11 LAB — CBC
HCT: 35.9 % — ABNORMAL LOW (ref 39.0–52.0)
Hemoglobin: 11.1 g/dL — ABNORMAL LOW (ref 13.0–17.0)
MCH: 30.1 pg (ref 26.0–34.0)
MCHC: 30.9 g/dL (ref 30.0–36.0)
MCV: 97.3 fL (ref 80.0–100.0)
Platelets: 122 10*3/uL — ABNORMAL LOW (ref 150–400)
RBC: 3.69 MIL/uL — ABNORMAL LOW (ref 4.22–5.81)
RDW: 13.2 % (ref 11.5–15.5)
WBC: 5.1 10*3/uL (ref 4.0–10.5)
nRBC: 0 % (ref 0.0–0.2)

## 2019-12-11 NOTE — Progress Notes (Signed)
Primary Care Physician: Chilton Greathouse, MD Primary Cardiologist: Dr Tresa Endo Structural Heart: Dr Clifton James Primary Electrophysiologist: Dr Graciela Husbands Referring Physician: Dr Keane Jorge Cherry is a 84 y.o. male with a history of CAD status four-vessel CABG in 1994, severe aortic stenosis status post TAVR on 11/14/2019, HFpEF, CVA, CKD stage III, prior GI bleed, HTN, HLD, GERD, and persistent atrial fibrillation who presents for follow up in the [facility]. Patient is on Eliquis for a CHADS2VASC score of 6. Patient was found to have afib post TAVR and a Zio patch was placed. He subsequently suffered 2 syncopal events.  He was brought via EMS after the last to [facility]. Review of his Zio patch showedmultiple pauses lasting from 8 to 11 seconds in duration and he underwent PPM implantation with Dr Graciela Husbands on 11/20/19.   On follow up today, patient reports that he has done reasonably well since his last visit. He reports that he feels stronger although his energy level is not back to baseline. He denies bleeding issues with anticoagulation.   Today, he denies symptoms of palpitations, chest pain, orthopnea, PND, lower extremity edema, dizziness, presyncope, syncope, snoring, daytime somnolence, bleeding, or neurologic sequela. The patient is tolerating medications without difficulties and is otherwise without complaint today.    Atrial Fibrillation Risk Factors:  he does not have symptoms or diagnosis of sleep apnea.   he has a BMI of Body mass index is 28.28 kg/m.[name] Filed Weights   12/11/19 1332  Weight: 84.4 kg    Family History  Problem Relation Age of Onset  . Cancer Mother   . Heart disease Father        died age 76  . Stroke Maternal Grandmother   . Cancer Maternal Grandfather   . Allergic rhinitis Neg Hx   . Angioedema Neg Hx   . Asthma Neg Hx   . Atopy Neg Hx   . Eczema Neg Hx   . Immunodeficiency Neg Hx   . Urticaria Neg Hx      Atrial Fibrillation  Management history:  Previous antiarrhythmic drugs: amiodarone Previous cardioversions: none Previous ablations: none CHADS2VASC score: 6 Anticoagulation history: Eliquis   Past Medical History:  Diagnosis Date  . Arthritis    "just a touch in my hands" (11/24/2016)  . Asthma   . Atherosclerosis of renal artery (HCC)    RENAL DOPPLER, 12/10/2011 - Left renal artery demonstrated narrowing with elevated velocities consistent with a 1-59% diameter reduction  . CKD (chronic kidney disease) stage 3, GFR 30-59 ml/min    "stable now since they backed off the water pills" (11/24/2016)  . Coronary artery disease    a. 1994 s/p CABG x 4 (LIMA-LAD, VG->D2, VG->OM, VG->RCA); b. 02/2004 PCI SVG-D2 (3.5x16 Taxus DES). VG->RCA 100. Sev apical LAD dzs distal to LIMA insertion; c. 11/2016 Cath: LAD 95p/146m, D2 100ost, LCX 100ost, 70p/m, RCA 100p/m, RPDA fills via L->R collats. VG->RCA 100, VG->D2 20 ost, patent prox stent, 37m, LIMA->LAD ok, VG->OM3 20p. EF 55%-->Med Rx.  . GERD (gastroesophageal reflux disease)   . High cholesterol   . History of lower GI bleeding    a. 09/2014 GIB due to diverticulosis/diverticulitis.  . Iron deficiency anemia   . Labile Hypertension   . Moderate aortic stenosis    a. 10/2011 Echo:  EF >55%, mild-mod TR, mild-mod AS, mod Ca2+ of AoV leaflets; b. 07/2016 Echo: EF 60-65%, no rwma, Gr1 DD, mod AS [(S) mean grad , peak grad .  Valve area (VTI): 1.33cm^2, (Vmax) 1.44cm^2. Mild MR]; c. 02/2018 Echo: EF 55-60%, no rwma, GR1 DD, Mod AS [Peak Vel (S): 319cm/s, Mean grad (S) , peak grad (S) 39mmHg].  [name] PAF (paroxysmal atrial fibrillation) (HCC)    a. CHA2DS2VASc = 4-->Eliquis.  . S/P TAVR (transcatheter aortic valve replacement) 11/14/2019   s/p TAVR with a 26 mm Edwards S3U via the TF approach by Drs Clifton James & Bartle  . Sleep apnea    Past Surgical History:  Procedure Laterality Date  . CARDIAC CATHETERIZATION  02/27/2004   Coronary intervention and medical  management  . CARDIAC CATHETERIZATION  11/24/2016  . CATARACT EXTRACTION W/ INTRAOCULAR LENS IMPLANT Left   . CORONARY ANGIOPLASTY  1994 X 2   "before bypass surgery"  . CORONARY ANGIOPLASTY WITH STENT PLACEMENT  03/04/2004   SVG supplying the diagonal vessel stented with a 3.5x39mm Taxus stent post dilated to 4.0 mm  . CORONARY ARTERY BYPASS GRAFT  1994   "CABG X4"  . INGUINAL HERNIA REPAIR Right   . PACEMAKER IMPLANT N/A 11/20/2019   Procedure: PACEMAKER IMPLANT;  Surgeon: Duke Salvia, MD;  Location: [facility] INVASIVE CV LAB;  Service: Cardiovascular;  Laterality: N/A;  . RIGHT HEART CATH AND CORONARY/GRAFT ANGIOGRAPHY N/A 11/24/2016   Procedure: Right Heart Cath and Coronary/Graft Angiography;  Surgeon: Lennette Bihari, MD;  Location: [facility] INVASIVE CV LAB;  Service: Cardiovascular;  Laterality: N/A;  . RIGHT/LEFT HEART CATH AND CORONARY/GRAFT ANGIOGRAPHY N/A 10/18/2019   Procedure: RIGHT/LEFT HEART CATH AND CORONARY/GRAFT ANGIOGRAPHY;  Surgeon: Lennette Bihari, MD;  Location: [facility] INVASIVE CV LAB;  Service: Cardiovascular;  Laterality: N/A;  . TEE WITHOUT CARDIOVERSION N/A 11/14/2019   Procedure: TRANSESOPHAGEAL ECHOCARDIOGRAM (TEE);  Surgeon: Kathleene Hazel, MD;  Location: [facility] INVASIVE CV LAB;  Service: Open Heart Surgery;  Laterality: N/A;  . TONSILLECTOMY AND ADENOIDECTOMY    . TRANSCATHETER AORTIC VALVE REPLACEMENT, TRANSFEMORAL N/A 11/14/2019   Procedure: TRANSCATHETER AORTIC VALVE REPLACEMENT, TRANSFEMORAL;  Surgeon: Kathleene Hazel, MD;  Location: [facility] INVASIVE CV LAB;  Service: Open Heart Surgery;  Laterality: N/A;    Current Outpatient Medications  Medication Sig Dispense Refill  . albuterol (VENTOLIN HFA) 108 (90 Base) MCG/ACT inhaler INHALE TWO PUFFS EVERY 4-6 HOURS AS NEEDED FOR COUGH OR WHEEZE 8.5 g 1  . amiodarone (PACERONE) 200 MG tablet Take 1 tablet (200 mg total) by mouth daily. 90 tablet 3  . amLODipine (NORVASC) 2.5 MG tablet Take 1 tablet (2.5 mg total) by mouth 2  (two) times daily. NEED OV. (Patient taking differently: Take 2.5 mg by mouth 2 (two) times daily. NEED OV. If blood pressure below 120 change to 2.5 mg daily) 180 tablet 0  . apixaban (ELIQUIS) 5 MG TABS tablet Take 1 tablet (5 mg total) by mouth 2 (two) times daily. 60 tablet 6  . atorvastatin (LIPITOR) 20 MG tablet Take 1 tablet (20 mg total) by mouth at bedtime. 30 tablet 0  . docusate sodium (COLACE) 100 MG capsule Take 100 mg by mouth daily.     [name] ezetimibe (ZETIA) 10 MG tablet TAKE 1 TABLET BY MOUTH EVERY DAY 90 tablet 2  . fenofibrate (TRICOR) 145 MG tablet Take 0.5 tablets (72.5 mg total) by mouth daily. 90 tablet 1  . fluticasone (FLONASE) 50 MCG/ACT nasal spray Place 2 sprays into both nostrils daily as needed for allergies or rhinitis.     [name] ipratropium (ATROVENT) 0.06 % nasal spray Can use two sprays in each nostril every six hours as needed  to dry up nose. 15 mL 5  . isosorbide mononitrate (IMDUR) 60 MG 24 hr tablet TAKE 1 AND 1/2 TABS (90MG ) BY MOUTH EVERY MORNING AND 1/2 TAB EVERY EVENING 180 tablet 3  . levothyroxine (SYNTHROID) 25 MCG tablet Take 25 mcg by mouth daily before breakfast.     . loratadine (CLARITIN) 10 MG tablet Take 10 mg by mouth every evening.     . Multiple Vitamins-Minerals (CENTRUM SILVER 50+MEN) TABS Take 1 tablet by mouth at bedtime.    . nitroGLYCERIN (NITROLINGUAL) 0.4 MG/SPRAY spray PLACE 1 SPRAY UNDER THE TONGUE EVERY 5 (FIVE) MINUTES X 3 DOSES AS NEEDED FOR CHEST PAIN. 12 g 1  . Omega-3 Fatty Acids (FISH OIL) 1000 MG CAPS Take 1,000 mg by mouth 2 (two) times daily.     . pantoprazole (PROTONIX) 40 MG tablet TAKE 1 TABLET BY MOUTH EVERY DAY 90 tablet 1  . ranolazine (RANEXA) 500 MG 12 hr tablet TAKE 1 TABLET BY MOUTH TWICE A DAY 180 tablet 3  . vitamin C (ASCORBIC ACID) 500 MG tablet Take 500 mg by mouth at bedtime.     . vitamin E 400 UNIT capsule Take 400 Units by mouth daily.    [name]. zinc gluconate 50 MG tablet Take 50 mg by mouth 2 (two) times a week.     . fluticasone (FLOVENT HFA) 110 MCG/ACT inhaler Inhale 1 puff into the lungs 3 (three) times daily as needed (for seasonal flares).  (Patient not taking: Reported on 12/11/2019)     No current facility-administered medications for this encounter.    Allergies  Allergen Reactions  . Altace [Ramipril] Swelling and Other (See Comments)    Mouth swelling  . Mucinex [Guaifenesin Er] Hives, Swelling and Other (See Comments)    Mouth swelling  . Contrast Media [Iodinated Diagnostic Agents] Other (See Comments)    Made eyes change each time    Social History   Socioeconomic History  . Marital status: Married    Spouse name: Not on file  . Number of children: 2  . Years of education: Not on file  . Highest education level: Not on file  Occupational History  . Not on file  Tobacco Use  . Smoking status: Never Smoker  . Smokeless tobacco: Never Used  Vaping Use  . Vaping Use: Never used  Substance and Sexual Activity  . Alcohol use: No    Alcohol/week: 0.0 standard drinks  . Drug use: No  . Sexual activity: Not Currently  Other Topics Concern  . Not on file  Social History Narrative   [place] at [place].  Retired from [employer].     Social Determinants of Health   Financial Resource Strain:   . Difficulty of Paying Living Expenses:   Food Insecurity:   . Worried About Programme researcher, broadcasting/film/videounning Out of Food in the Last Year:   . Baristaan Out of Food in the Last Year:   Transportation Needs:   . Freight forwarderLack of Transportation (Medical):   [name]. Lack of Transportation (Non-Medical):   Physical Activity:   . Days of Exercise per Week:   . Minutes of Exercise per Session:   Stress:   . Feeling of Stress :   Social Connections:   . Frequency of Communication with Friends and Family:   . Frequency of Social Gatherings with Friends and Family:   . Attends Religious Services:   . Active Member of Clubs or Organizations:   . Attends Club or  Organization Meetings:   [name] Marital  Status:   Intimate Partner Violence:   . Fear of Current or Ex-Partner:   . Emotionally Abused:   [name] Physically Abused:   . Sexually Abused:      ROS- All systems are reviewed and negative except as per the HPI above.  Physical Exam: Vitals:   12/11/19 1332  BP: (!) 160/84  Pulse: 77  Weight: 84.4 kg  Height: 5\' 8"  (1.727 m)    GEN- The patient is well appearing elderly male, alert and oriented x 3 today.   HEENT-head normocephalic, atraumatic, sclera clear, conjunctiva pink, hearing intact, trachea midline. Lungs- Clear to ausculation bilaterally, normal work of breathing Heart- irregular rate and rhythm, no murmurs, rubs or gallops  GI- soft, NT, ND, + BS Extremities- no clubbing, cyanosis, or edema MS- no significant deformity or atrophy Skin- no rash or lesion Psych- euthymic mood, full affect Neuro- strength and sensation are intact   Wt Readings from Last 3 Encounters:  12/11/19 84.4 kg  11/29/19 83.6 kg  11/21/19 86.5 kg    EKG today demonstrates afib HR 77, PVCs, LBBB, QRS 176, QTc 543  Echo 11/15/19 demonstrated  1. Day 1 post TAVR, no paravalvular leak. Normal transaortic gradients  peak/mean 17/10 mmHg. LVEF improved to 55-60%.  2. Left ventricular ejection fraction, by estimation, is 55 to 60%. The  left ventricle has normal function. There is moderate concentric left  ventricular hypertrophy.  3. Trivial mitral valve regurgitation.  4. The aortic valve has been repaired/replaced. There is a 26 mm Ultra,  stented (TAVR) valve present in the aortic position. Procedure Date:  11/14/2019. Echo findings are consistent with normal structure and  function of the aortic valve prosthesis.  Aortic valve mean gradient measures 10.0 mmHg.  5. There is normal pulmonary artery systolic pressure. The estimated  right ventricular systolic pressure is 29.6 mmHg.   Epic records are reviewed at length today  CHA2DS2-VASc Score = 6  The patient's score is based  upon: CHF History: 0 HTN History: 1 Age : 2 Diabetes History: 0 Stroke History: 2 Vascular Disease History: 1 Gender: 0      ASSESSMENT AND PLAN: 1. Persistent Atrial Fibrillation (ICD10:  I48.19) The patient's CHA2DS2-VASc score is 6, indicating a 9.7% annual risk of stroke. Patient remains in rate controlled afib. We discussed a trial of SR with DCCV to see if his fatigue improves. Will arrange for DCCV once he has had 3 weeks of uninterrupted anticoagulation (8/6). Check bmet/CBC Continue amiodarone 200 mg daily Continue Eliquis 5 mg BID  2. Secondary Hypercoagulable State (ICD10:  D68.69) The patient is at significant risk for stroke/thromboembolism based upon his CHA2DS2-VASc Score of 6.  Continue Apixaban (Eliquis).   3.CAD S/p CABG 1994 No anginal symptoms.  4. Aortic stenosis S/p TAVR 11/14/19.  5. HTN Elevated today, well controlled on his last visit. Will reassess at f/u.  6. CHB/symptomatic bradycardia S/p PPM, followed by Dr 01/15/20 and the device clinic.   Follow up in the AF clinic one week post DCCV.   Graciela Husbands PA-C Afib Clinic [facility] [address] [place], [place] [phone] 12/11/2019 2:42 PM

## 2019-12-11 NOTE — H&P (View-Only) (Signed)
Primary Care Physician: Chilton Greathouse, MD Primary Cardiologist: Dr Tresa Endo Structural Heart: Dr Clifton James Primary Electrophysiologist: Dr Graciela Husbands Referring Physician: Dr Keane Scrape is a 84 y.o. male with a history of CAD status four-vessel CABG in 1994, severe aortic stenosis status post TAVR on 11/14/2019, HFpEF, CVA, CKD stage III, prior GI bleed, HTN, HLD, GERD, and persistent atrial fibrillation who presents for follow up in the [facility]. Patient is on Eliquis for a CHADS2VASC score of 6. Patient was found to have afib post TAVR and a Zio patch was placed. He subsequently suffered 2 syncopal events.  He was brought via EMS after the last to [facility]. Review of his Zio patch showedmultiple pauses lasting from 8 to 11 seconds in duration and he underwent PPM implantation with Dr Graciela Husbands on 11/20/19.   On follow up today, patient reports that he has done reasonably well since his last visit. He reports that he feels stronger although his energy level is not back to baseline. He denies bleeding issues with anticoagulation.   Today, he denies symptoms of palpitations, chest pain, orthopnea, PND, lower extremity edema, dizziness, presyncope, syncope, snoring, daytime somnolence, bleeding, or neurologic sequela. The patient is tolerating medications without difficulties and is otherwise without complaint today.    Atrial Fibrillation Risk Factors:  he does not have symptoms or diagnosis of sleep apnea.   he has a BMI of Body mass index is 28.28 kg/m.[name] Filed Weights   12/11/19 1332  Weight: 84.4 kg    Family History  Problem Relation Age of Onset  . Cancer Mother   . Heart disease Father        died age 76  . Stroke Maternal Grandmother   . Cancer Maternal Grandfather   . Allergic rhinitis Neg Hx   . Angioedema Neg Hx   . Asthma Neg Hx   . Atopy Neg Hx   . Eczema Neg Hx   . Immunodeficiency Neg Hx   . Urticaria Neg Hx      Atrial Fibrillation  Management history:  Previous antiarrhythmic drugs: amiodarone Previous cardioversions: none Previous ablations: none CHADS2VASC score: 6 Anticoagulation history: Eliquis   Past Medical History:  Diagnosis Date  . Arthritis    "just a touch in my hands" (11/24/2016)  . Asthma   . Atherosclerosis of renal artery (HCC)    RENAL DOPPLER, 12/10/2011 - Left renal artery demonstrated narrowing with elevated velocities consistent with a 1-59% diameter reduction  . CKD (chronic kidney disease) stage 3, GFR 30-59 ml/min    "stable now since they backed off the water pills" (11/24/2016)  . Coronary artery disease    a. 1994 s/p CABG x 4 (LIMA-LAD, VG->D2, VG->OM, VG->RCA); b. 02/2004 PCI SVG-D2 (3.5x16 Taxus DES). VG->RCA 100. Sev apical LAD dzs distal to LIMA insertion; c. 11/2016 Cath: LAD 95p/146m, D2 100ost, LCX 100ost, 70p/m, RCA 100p/m, RPDA fills via L->R collats. VG->RCA 100, VG->D2 20 ost, patent prox stent, 37m, LIMA->LAD ok, VG->OM3 20p. EF 55%-->Med Rx.  . GERD (gastroesophageal reflux disease)   . High cholesterol   . History of lower GI bleeding    a. 09/2014 GIB due to diverticulosis/diverticulitis.  . Iron deficiency anemia   . Labile Hypertension   . Moderate aortic stenosis    a. 10/2011 Echo:  EF >55%, mild-mod TR, mild-mod AS, mod Ca2+ of AoV leaflets; b. 07/2016 Echo: EF 60-65%, no rwma, Gr1 DD, mod AS [(S) mean grad , peak grad .  Valve area (VTI): 1.33cm^2, (Vmax) 1.44cm^2. Mild MR]; c. 02/2018 Echo: EF 55-60%, no rwma, GR1 DD, Mod AS [Peak Vel (S): 319cm/s, Mean grad (S) , peak grad (S) 39mmHg].  [name] PAF (paroxysmal atrial fibrillation) (HCC)    a. CHA2DS2VASc = 4-->Eliquis.  . S/P TAVR (transcatheter aortic valve replacement) 11/14/2019   s/p TAVR with a 26 mm Edwards S3U via the TF approach by Drs Clifton James & Bartle  . Sleep apnea    Past Surgical History:  Procedure Laterality Date  . CARDIAC CATHETERIZATION  02/27/2004   Coronary intervention and medical  management  . CARDIAC CATHETERIZATION  11/24/2016  . CATARACT EXTRACTION W/ INTRAOCULAR LENS IMPLANT Left   . CORONARY ANGIOPLASTY  1994 X 2   "before bypass surgery"  . CORONARY ANGIOPLASTY WITH STENT PLACEMENT  03/04/2004   SVG supplying the diagonal vessel stented with a 3.5x39mm Taxus stent post dilated to 4.0 mm  . CORONARY ARTERY BYPASS GRAFT  1994   "CABG X4"  . INGUINAL HERNIA REPAIR Right   . PACEMAKER IMPLANT N/A 11/20/2019   Procedure: PACEMAKER IMPLANT;  Surgeon: Duke Salvia, MD;  Location: [facility] INVASIVE CV LAB;  Service: Cardiovascular;  Laterality: N/A;  . RIGHT HEART CATH AND CORONARY/GRAFT ANGIOGRAPHY N/A 11/24/2016   Procedure: Right Heart Cath and Coronary/Graft Angiography;  Surgeon: Lennette Bihari, MD;  Location: [facility] INVASIVE CV LAB;  Service: Cardiovascular;  Laterality: N/A;  . RIGHT/LEFT HEART CATH AND CORONARY/GRAFT ANGIOGRAPHY N/A 10/18/2019   Procedure: RIGHT/LEFT HEART CATH AND CORONARY/GRAFT ANGIOGRAPHY;  Surgeon: Lennette Bihari, MD;  Location: [facility] INVASIVE CV LAB;  Service: Cardiovascular;  Laterality: N/A;  . TEE WITHOUT CARDIOVERSION N/A 11/14/2019   Procedure: TRANSESOPHAGEAL ECHOCARDIOGRAM (TEE);  Surgeon: Kathleene Hazel, MD;  Location: [facility] INVASIVE CV LAB;  Service: Open Heart Surgery;  Laterality: N/A;  . TONSILLECTOMY AND ADENOIDECTOMY    . TRANSCATHETER AORTIC VALVE REPLACEMENT, TRANSFEMORAL N/A 11/14/2019   Procedure: TRANSCATHETER AORTIC VALVE REPLACEMENT, TRANSFEMORAL;  Surgeon: Kathleene Hazel, MD;  Location: [facility] INVASIVE CV LAB;  Service: Open Heart Surgery;  Laterality: N/A;    Current Outpatient Medications  Medication Sig Dispense Refill  . albuterol (VENTOLIN HFA) 108 (90 Base) MCG/ACT inhaler INHALE TWO PUFFS EVERY 4-6 HOURS AS NEEDED FOR COUGH OR WHEEZE 8.5 g 1  . amiodarone (PACERONE) 200 MG tablet Take 1 tablet (200 mg total) by mouth daily. 90 tablet 3  . amLODipine (NORVASC) 2.5 MG tablet Take 1 tablet (2.5 mg total) by mouth 2  (two) times daily. NEED OV. (Patient taking differently: Take 2.5 mg by mouth 2 (two) times daily. NEED OV. If blood pressure below 120 change to 2.5 mg daily) 180 tablet 0  . apixaban (ELIQUIS) 5 MG TABS tablet Take 1 tablet (5 mg total) by mouth 2 (two) times daily. 60 tablet 6  . atorvastatin (LIPITOR) 20 MG tablet Take 1 tablet (20 mg total) by mouth at bedtime. 30 tablet 0  . docusate sodium (COLACE) 100 MG capsule Take 100 mg by mouth daily.     [name] ezetimibe (ZETIA) 10 MG tablet TAKE 1 TABLET BY MOUTH EVERY DAY 90 tablet 2  . fenofibrate (TRICOR) 145 MG tablet Take 0.5 tablets (72.5 mg total) by mouth daily. 90 tablet 1  . fluticasone (FLONASE) 50 MCG/ACT nasal spray Place 2 sprays into both nostrils daily as needed for allergies or rhinitis.     [name] ipratropium (ATROVENT) 0.06 % nasal spray Can use two sprays in each nostril every six hours as needed  to dry up nose. 15 mL 5  . isosorbide mononitrate (IMDUR) 60 MG 24 hr tablet TAKE 1 AND 1/2 TABS (90MG) BY MOUTH EVERY MORNING AND 1/2 TAB EVERY EVENING 180 tablet 3  . levothyroxine (SYNTHROID) 25 MCG tablet Take 25 mcg by mouth daily before breakfast.     . loratadine (CLARITIN) 10 MG tablet Take 10 mg by mouth every evening.     . Multiple Vitamins-Minerals (CENTRUM SILVER 50+MEN) TABS Take 1 tablet by mouth at bedtime.    . nitroGLYCERIN (NITROLINGUAL) 0.4 MG/SPRAY spray PLACE 1 SPRAY UNDER THE TONGUE EVERY 5 (FIVE) MINUTES X 3 DOSES AS NEEDED FOR CHEST PAIN. 12 g 1  . Omega-3 Fatty Acids (FISH OIL) 1000 MG CAPS Take 1,000 mg by mouth 2 (two) times daily.     . pantoprazole (PROTONIX) 40 MG tablet TAKE 1 TABLET BY MOUTH EVERY DAY 90 tablet 1  . ranolazine (RANEXA) 500 MG 12 hr tablet TAKE 1 TABLET BY MOUTH TWICE A DAY 180 tablet 3  . vitamin C (ASCORBIC ACID) 500 MG tablet Take 500 mg by mouth at bedtime.     . vitamin E 400 UNIT capsule Take 400 Units by mouth daily.    . zinc gluconate 50 MG tablet Take 50 mg by mouth 2 (two) times a week.     . fluticasone (FLOVENT HFA) 110 MCG/ACT inhaler Inhale 1 puff into the lungs 3 (three) times daily as needed (for seasonal flares).  (Patient not taking: Reported on 12/11/2019)     No current facility-administered medications for this encounter.    Allergies  Allergen Reactions  . Altace [Ramipril] Swelling and Other (See Comments)    Mouth swelling  . Mucinex [Guaifenesin Er] Hives, Swelling and Other (See Comments)    Mouth swelling  . Contrast Media [Iodinated Diagnostic Agents] Other (See Comments)    Made eyes change each time    Social History   Socioeconomic History  . Marital status: Married    Spouse name: Not on file  . Number of children: 2  . Years of education: Not on file  . Highest education level: Not on file  Occupational History  . Not on file  Tobacco Use  . Smoking status: Never Smoker  . Smokeless tobacco: Never Used  Vaping Use  . Vaping Use: Never used  Substance and Sexual Activity  . Alcohol use: No    Alcohol/week: 0.0 standard drinks  . Drug use: No  . Sexual activity: Not Currently  Other Topics Concern  . Not on file  Social History Narrative   Lives at home in [place] with wife.  Retired from [employer] department.     Social Determinants of Health   Financial Resource Strain:   . Difficulty of Paying Living Expenses:   Food Insecurity:   . Worried About Running Out of Food in the Last Year:   . Ran Out of Food in the Last Year:   Transportation Needs:   . Lack of Transportation (Medical):   . Lack of Transportation (Non-Medical):   Physical Activity:   . Days of Exercise per Week:   . Minutes of Exercise per Session:   Stress:   . Feeling of Stress :   Social Connections:   . Frequency of Communication with Friends and Family:   . Frequency of Social Gatherings with Friends and Family:   . Attends Religious Services:   . Active Member of Clubs or Organizations:   . Attends Club or   Organization Meetings:   [name] Marital  Status:   Intimate Partner Violence:   . Fear of Current or Ex-Partner:   . Emotionally Abused:   [name] Physically Abused:   . Sexually Abused:      ROS- All systems are reviewed and negative except as per the HPI above.  Physical Exam: Vitals:   12/11/19 1332  BP: (!) 160/84  Pulse: 77  Weight: 84.4 kg  Height: 5\' 8"  (1.727 m)    GEN- The patient is well appearing elderly male, alert and oriented x 3 today.   HEENT-head normocephalic, atraumatic, sclera clear, conjunctiva pink, hearing intact, trachea midline. Lungs- Clear to ausculation bilaterally, normal work of breathing Heart- irregular rate and rhythm, no murmurs, rubs or gallops  GI- soft, NT, ND, + BS Extremities- no clubbing, cyanosis, or edema MS- no significant deformity or atrophy Skin- no rash or lesion Psych- euthymic mood, full affect Neuro- strength and sensation are intact   Wt Readings from Last 3 Encounters:  12/11/19 84.4 kg  11/29/19 83.6 kg  11/21/19 86.5 kg    EKG today demonstrates afib HR 77, PVCs, LBBB, QRS 176, QTc 543  Echo 11/15/19 demonstrated  1. Day 1 post TAVR, no paravalvular leak. Normal transaortic gradients  peak/mean 17/10 mmHg. LVEF improved to 55-60%.  2. Left ventricular ejection fraction, by estimation, is 55 to 60%. The  left ventricle has normal function. There is moderate concentric left  ventricular hypertrophy.  3. Trivial mitral valve regurgitation.  4. The aortic valve has been repaired/replaced. There is a 26 mm Ultra,  stented (TAVR) valve present in the aortic position. Procedure Date:  11/14/2019. Echo findings are consistent with normal structure and  function of the aortic valve prosthesis.  Aortic valve mean gradient measures 10.0 mmHg.  5. There is normal pulmonary artery systolic pressure. The estimated  right ventricular systolic pressure is 29.6 mmHg.   Epic records are reviewed at length today  CHA2DS2-VASc Score = 6  The patient's score is based  upon: CHF History: 0 HTN History: 1 Age : 2 Diabetes History: 0 Stroke History: 2 Vascular Disease History: 1 Gender: 0      ASSESSMENT AND PLAN: 1. Persistent Atrial Fibrillation (ICD10:  I48.19) The patient's CHA2DS2-VASc score is 6, indicating a 9.7% annual risk of stroke. Patient remains in rate controlled afib. We discussed a trial of SR with DCCV to see if his fatigue improves. Will arrange for DCCV once he has had 3 weeks of uninterrupted anticoagulation (8/6). Check bmet/CBC Continue amiodarone 200 mg daily Continue Eliquis 5 mg BID  2. Secondary Hypercoagulable State (ICD10:  D68.69) The patient is at significant risk for stroke/thromboembolism based upon his CHA2DS2-VASc Score of 6.  Continue Apixaban (Eliquis).   3.CAD S/p CABG 1994 No anginal symptoms.  4. Aortic stenosis S/p TAVR 11/14/19.  5. HTN Elevated today, well controlled on his last visit. Will reassess at f/u.  6. CHB/symptomatic bradycardia S/p PPM, followed by Dr 01/15/20 and the device clinic.   Follow up in the AF clinic one week post DCCV.   Graciela Husbands PA-C Afib Clinic [facility] [address] [place], [place] [phone] 12/11/2019 2:42 PM

## 2019-12-11 NOTE — Patient Instructions (Signed)
Cardioversion scheduled for Wednesday, August 11th  - Arrive at the [facility] and go to admitting at [facility]  - Do not eat or drink anything after midnight the night prior to your procedure.  - Take all your morning medication (except diabetic medications) with a sip of water prior to arrival.  - You will not be able to drive home after your procedure.  - Do NOT miss any doses of your blood thinner - if you should miss a dose please notify our office immediately.

## 2019-12-18 ENCOUNTER — Other Ambulatory Visit: Payer: Self-pay

## 2019-12-18 ENCOUNTER — Other Ambulatory Visit
Admission: RE | Admit: 2019-12-18 | Discharge: 2019-12-18 | Disposition: A | Discharge status: Home / Self Care | Payer: Medicare PPO | Source: Ambulatory Visit | Attending: Cardiovascular Disease | Admitting: Cardiovascular Disease

## 2019-12-18 DIAGNOSIS — Z01812 Encounter for preprocedural laboratory examination: Secondary | ICD-10-CM | POA: Insufficient documentation

## 2019-12-18 DIAGNOSIS — Z20822 Contact with and (suspected) exposure to covid-19: Secondary | ICD-10-CM | POA: Diagnosis not present

## 2019-12-19 LAB — SARS CORONAVIRUS 2 (TAT 6-24 HRS): SARS Coronavirus 2: NEGATIVE

## 2019-12-20 ENCOUNTER — Ambulatory Visit (HOSPITAL_COMMUNITY): Payer: Medicare PPO | Admitting: Anesthesiology

## 2019-12-20 ENCOUNTER — Encounter (HOSPITAL_COMMUNITY): Payer: Self-pay | Admitting: Cardiovascular Disease

## 2019-12-20 ENCOUNTER — Ambulatory Visit (HOSPITAL_COMMUNITY)
Admission: AD | Admit: 2019-12-20 | Discharge: 2019-12-20 | Disposition: A | Discharge status: Home / Self Care | Payer: Medicare PPO | Attending: Cardiovascular Disease | Admitting: Cardiovascular Disease

## 2019-12-20 ENCOUNTER — Encounter (HOSPITAL_COMMUNITY)
Admission: AD | Disposition: A | Discharge status: Home / Self Care | Payer: Self-pay | Source: Home / Self Care | Attending: Cardiovascular Disease

## 2019-12-20 DIAGNOSIS — D6869 Other thrombophilia: Secondary | ICD-10-CM | POA: Diagnosis not present

## 2019-12-20 DIAGNOSIS — I350 Nonrheumatic aortic (valve) stenosis: Secondary | ICD-10-CM | POA: Insufficient documentation

## 2019-12-20 DIAGNOSIS — Z79890 Hormone replacement therapy (postmenopausal): Secondary | ICD-10-CM | POA: Diagnosis not present

## 2019-12-20 DIAGNOSIS — I5033 Acute on chronic diastolic (congestive) heart failure: Secondary | ICD-10-CM | POA: Diagnosis not present

## 2019-12-20 DIAGNOSIS — D509 Iron deficiency anemia, unspecified: Secondary | ICD-10-CM | POA: Diagnosis not present

## 2019-12-20 DIAGNOSIS — Z7901 Long term (current) use of anticoagulants: Secondary | ICD-10-CM | POA: Insufficient documentation

## 2019-12-20 DIAGNOSIS — I442 Atrioventricular block, complete: Secondary | ICD-10-CM | POA: Insufficient documentation

## 2019-12-20 DIAGNOSIS — Z952 Presence of prosthetic heart valve: Secondary | ICD-10-CM | POA: Diagnosis not present

## 2019-12-20 DIAGNOSIS — N183 Chronic kidney disease, stage 3 unspecified: Secondary | ICD-10-CM | POA: Diagnosis not present

## 2019-12-20 DIAGNOSIS — I701 Atherosclerosis of renal artery: Secondary | ICD-10-CM | POA: Insufficient documentation

## 2019-12-20 DIAGNOSIS — Z8673 Personal history of transient ischemic attack (TIA), and cerebral infarction without residual deficits: Secondary | ICD-10-CM | POA: Insufficient documentation

## 2019-12-20 DIAGNOSIS — I509 Heart failure, unspecified: Secondary | ICD-10-CM | POA: Insufficient documentation

## 2019-12-20 DIAGNOSIS — I130 Hypertensive heart and chronic kidney disease with heart failure and stage 1 through stage 4 chronic kidney disease, or unspecified chronic kidney disease: Secondary | ICD-10-CM | POA: Insufficient documentation

## 2019-12-20 DIAGNOSIS — J45909 Unspecified asthma, uncomplicated: Secondary | ICD-10-CM | POA: Insufficient documentation

## 2019-12-20 DIAGNOSIS — Z951 Presence of aortocoronary bypass graft: Secondary | ICD-10-CM | POA: Insufficient documentation

## 2019-12-20 DIAGNOSIS — I4819 Other persistent atrial fibrillation: Secondary | ICD-10-CM | POA: Diagnosis not present

## 2019-12-20 DIAGNOSIS — E785 Hyperlipidemia, unspecified: Secondary | ICD-10-CM | POA: Insufficient documentation

## 2019-12-20 DIAGNOSIS — D631 Anemia in chronic kidney disease: Secondary | ICD-10-CM | POA: Diagnosis not present

## 2019-12-20 DIAGNOSIS — I2510 Atherosclerotic heart disease of native coronary artery without angina pectoris: Secondary | ICD-10-CM | POA: Insufficient documentation

## 2019-12-20 DIAGNOSIS — Z79899 Other long term (current) drug therapy: Secondary | ICD-10-CM | POA: Diagnosis not present

## 2019-12-20 DIAGNOSIS — K219 Gastro-esophageal reflux disease without esophagitis: Secondary | ICD-10-CM | POA: Diagnosis not present

## 2019-12-20 DIAGNOSIS — R001 Bradycardia, unspecified: Secondary | ICD-10-CM | POA: Insufficient documentation

## 2019-12-20 DIAGNOSIS — E7800 Pure hypercholesterolemia, unspecified: Secondary | ICD-10-CM | POA: Diagnosis not present

## 2019-12-20 DIAGNOSIS — I483 Typical atrial flutter: Secondary | ICD-10-CM | POA: Diagnosis not present

## 2019-12-20 DIAGNOSIS — I480 Paroxysmal atrial fibrillation: Secondary | ICD-10-CM | POA: Diagnosis not present

## 2019-12-20 HISTORY — PX: CARDIOVERSION: SHX1299

## 2019-12-20 SURGERY — CARDIOVERSION
Anesthesia: General

## 2019-12-20 MED ORDER — LIDOCAINE 2% (20 MG/ML) 5 ML SYRINGE
INTRAMUSCULAR | Status: DC | PRN
  Administered 2019-12-20: 60 mg via INTRAVENOUS

## 2019-12-20 MED ORDER — PROPOFOL 10 MG/ML IV BOLUS
INTRAVENOUS | Status: DC | PRN
  Administered 2019-12-20: 50 mg via INTRAVENOUS

## 2019-12-20 MED ORDER — SODIUM CHLORIDE 0.9 % IV SOLN: INTRAVENOUS | Status: DC | PRN

## 2019-12-20 NOTE — CV Procedure (Signed)
Electrical Cardioversion Procedure Note Jorge Cherry [id number] [birth year]  Procedure: Electrical Cardioversion Indications:  Atrial Fibrillation  Procedure Details Consent: Risks of procedure as well as the alternatives and risks of each were explained to the (patient/caregiver).  Consent for procedure obtained. Time Out: Verified patient identification, verified procedure, site/side was marked, verified correct patient position, special equipment/implants available, medications/allergies/relevent history reviewed, required imaging and test results available.  Performed  Patient placed on cardiac monitor, pulse oximetry, supplemental oxygen as necessary.  Sedation given: propofol Pacer pads placed anterior and posterior chest.  Cardioverted 1 time(s).  Cardioverted at 150J.  Evaluation Findings: Post procedure EKG shows: NSR Complications: None Patient did tolerate procedure well.   Chilton Si, MD 12/20/2019, 12:12 PM

## 2019-12-20 NOTE — Interval H&P Note (Signed)
History and Physical Interval Note:  12/20/2019 11:46 AM  Jorge Cherry  has presented today for surgery, with the diagnosis of AFIB.  The various methods of treatment have been discussed with the patient and family. After consideration of risks, benefits and other options for treatment, the patient has consented to  Procedure(s): CARDIOVERSION (N/A) as a surgical intervention.  The patient's history has been reviewed, patient examined, no change in status, stable for surgery.  I have reviewed the patient's chart and labs.  Questions were answered to the patient's satisfaction.     Chilton Si, MD

## 2019-12-20 NOTE — Anesthesia Postprocedure Evaluation (Signed)
Anesthesia Post Note  Patient: Jorge Cherry  Procedure(s) Performed: CARDIOVERSION (N/A )     Patient location during evaluation: PACU Anesthesia Type: General Level of consciousness: awake and alert, oriented and patient cooperative Pain management: pain level controlled Vital Signs Assessment: post-procedure vital signs reviewed and stable Respiratory status: spontaneous breathing, nonlabored ventilation and respiratory function stable Cardiovascular status: blood pressure returned to baseline and stable Postop Assessment: no apparent nausea or vomiting Anesthetic complications: no   No complications documented.  Last Vitals:  Vitals:   12/20/19 1230 12/20/19 1240  BP: (!) 154/82 (!) 171/83  Pulse: 65 66  Resp: 13 12  SpO2: 94% 95%    Last Pain:  Vitals:   12/20/19 1240  PainSc: 0-No pain                 Tennis Must Laconda Basich

## 2019-12-20 NOTE — Anesthesia Preprocedure Evaluation (Signed)
Anesthesia Evaluation  Patient identified by MRN, date of birth, ID band Patient awake    Reviewed: Allergy & Precautions, NPO status , Patient's Chart, lab work & pertinent test results  Airway Mallampati: II  TM Distance: >3 FB Neck ROM: Full    Dental no notable dental hx.    Pulmonary asthma , sleep apnea ,    Pulmonary exam normal breath sounds clear to auscultation       Cardiovascular hypertension, Pt. on medications + CAD, + CABG (1994 CABG x 4) and + Peripheral Vascular Disease  Normal cardiovascular exam+ dysrhythmias Atrial Fibrillation  Rhythm:Regular Rate:Normal  TAVR 11/2019  Post-TAVR echo: 1. Day 1 post TAVR, no paravalvular leak. Normal transaortic gradients  peak/mean 17/10 mmHg. LVEF improved to 55-60%.  2. Left ventricular ejection fraction, by estimation, is 55 to 60%. The  left ventricle has normal function. There is moderate concentric left  ventricular hypertrophy.  3. Trivial mitral valve regurgitation.  4. The aortic valve has been repaired/replaced. There is a 26 mm Ultra,  stented (TAVR) valve present in the aortic position. Procedure Date:  11/14/2019. Echo findings are consistent with normal structure and  function of the aortic valve prosthesis.  Aortic valve mean gradient measures 10.0 mmHg.  5. There is normal pulmonary artery systolic pressure. The estimated  right ventricular systolic pressure is 29.6 mmHg.    Neuro/Psych negative neurological ROS  negative psych ROS   GI/Hepatic Neg liver ROS, GERD  Medicated and Controlled,Hx GIB, diverticulosis    Endo/Other  Hypothyroidism   Renal/GU Renal InsufficiencyRenal diseaseCr 1.39  negative genitourinary   Musculoskeletal  (+) Arthritis , Osteoarthritis,    Abdominal   Peds  Hematology  (+) Blood dyscrasia, anemia , hct 35.9, plt 122   Anesthesia Other Findings   Reproductive/Obstetrics negative OB ROS                              Anesthesia Physical Anesthesia Plan  ASA: III  Anesthesia Plan: General   Post-op Pain Management:    Induction: Intravenous  PONV Risk Score and Plan: Treatment may vary due to age or medical condition, Propofol infusion and TIVA  Airway Management Planned: Mask and Natural Airway  Additional Equipment: None  Intra-op Plan:   Post-operative Plan:   Informed Consent: I have reviewed the patients History and Physical, chart, labs and discussed the procedure including the risks, benefits and alternatives for the proposed anesthesia with the patient or authorized representative who has indicated his/her understanding and acceptance.     Dental advisory given  Plan Discussed with: CRNA  Anesthesia Plan Comments:         Anesthesia Quick Evaluation

## 2019-12-20 NOTE — Discharge Instructions (Signed)
Electrical Cardioversion Electrical cardioversion is the delivery of a jolt of electricity to restore a normal rhythm to the heart. A rhythm that is too fast or is not regular keeps the heart from pumping well. In this procedure, sticky patches or metal paddles are placed on the chest to deliver electricity to the heart from a device.  Follow these instructions at home:  Do not drive for 24 hours if you were given a sedative during your procedure.  Take over-the-counter and prescription medicines only as told by your health care provider.  Ask your health care provider how to check your pulse. Check it often.  Rest for 48 hours after the procedure or as told by your health care provider.  Avoid or limit your caffeine use as told by your health care provider.  Keep all follow-up visits as told by your health care provider. This is important. Contact a health care provider if:  You feel like your heart is beating too quickly or your pulse is not regular.  You have a serious muscle cramp that does not go away. Get help right away if:  You have discomfort in your chest.  You are dizzy or you feel faint.  You have trouble breathing or you are short of breath.  Your speech is slurred.  You have trouble moving an arm or leg on one side of your body.  Your fingers or toes turn cold or blue. Summary  Electrical cardioversion is the delivery of a jolt of electricity to restore a normal rhythm to the heart.  This procedure may be done right away in an emergency or may be a scheduled procedure if the condition is not an emergency.  Generally, this is a safe procedure.  After the procedure, check your pulse often as told by your health care provider. This information is not intended to replace advice given to you by your health care provider. Make sure you discuss any questions you have with your health care provider. Document Revised: 11/28/2018 Document Reviewed: 11/28/2018 Elsevier  Patient Education  2020 [employer] Inc[employer]  

## 2019-12-20 NOTE — Transfer of Care (Signed)
Immediate Anesthesia Transfer of Care Note  Patient: Jorge Cherry  Procedure(s) Performed: CARDIOVERSION (N/A )  Patient Location: Endoscopy Unit  Anesthesia Type:General  Level of Consciousness: drowsy  Airway & Oxygen Therapy: Patient Spontanous Breathing  Post-op Assessment: Report given to RN and Post -op Vital signs reviewed and stable  Post vital signs: Reviewed and stable  Last Vitals:  Vitals Value Taken Time  BP    Temp    Pulse    Resp    SpO2      Last Pain: There were no vitals filed for this visit.       Complications: No complications documented.

## 2019-12-21 ENCOUNTER — Encounter (HOSPITAL_COMMUNITY): Payer: Self-pay | Admitting: Cardiovascular Disease

## 2019-12-25 ENCOUNTER — Ambulatory Visit: Payer: Medicare PPO | Admitting: Physician Assistant

## 2019-12-25 ENCOUNTER — Other Ambulatory Visit (HOSPITAL_COMMUNITY): Payer: Medicare PPO

## 2019-12-27 ENCOUNTER — Other Ambulatory Visit: Payer: Self-pay | Admitting: Cardiovascular Disease

## 2019-12-27 ENCOUNTER — Other Ambulatory Visit: Payer: Self-pay

## 2019-12-27 ENCOUNTER — Ambulatory Visit (HOSPITAL_COMMUNITY)
Admission: RE | Admit: 2019-12-27 | Discharge: 2019-12-27 | Disposition: A | Discharge status: Home / Self Care | Payer: Medicare PPO | Source: Ambulatory Visit | Attending: Physician Assistant | Admitting: Physician Assistant

## 2019-12-27 VITALS — BP 170/70 | HR 70 | Ht 68.0 in | Wt 185.6 lb

## 2019-12-27 DIAGNOSIS — E785 Hyperlipidemia, unspecified: Secondary | ICD-10-CM | POA: Diagnosis not present

## 2019-12-27 DIAGNOSIS — Z8673 Personal history of transient ischemic attack (TIA), and cerebral infarction without residual deficits: Secondary | ICD-10-CM | POA: Diagnosis not present

## 2019-12-27 DIAGNOSIS — I350 Nonrheumatic aortic (valve) stenosis: Secondary | ICD-10-CM | POA: Insufficient documentation

## 2019-12-27 DIAGNOSIS — D6869 Other thrombophilia: Secondary | ICD-10-CM | POA: Insufficient documentation

## 2019-12-27 DIAGNOSIS — I701 Atherosclerosis of renal artery: Secondary | ICD-10-CM | POA: Insufficient documentation

## 2019-12-27 DIAGNOSIS — Z952 Presence of prosthetic heart valve: Secondary | ICD-10-CM | POA: Diagnosis not present

## 2019-12-27 DIAGNOSIS — I2510 Atherosclerotic heart disease of native coronary artery without angina pectoris: Secondary | ICD-10-CM | POA: Insufficient documentation

## 2019-12-27 DIAGNOSIS — J45909 Unspecified asthma, uncomplicated: Secondary | ICD-10-CM | POA: Diagnosis not present

## 2019-12-27 DIAGNOSIS — I447 Left bundle-branch block, unspecified: Secondary | ICD-10-CM | POA: Diagnosis not present

## 2019-12-27 DIAGNOSIS — I130 Hypertensive heart and chronic kidney disease with heart failure and stage 1 through stage 4 chronic kidney disease, or unspecified chronic kidney disease: Secondary | ICD-10-CM | POA: Insufficient documentation

## 2019-12-27 DIAGNOSIS — N183 Chronic kidney disease, stage 3 unspecified: Secondary | ICD-10-CM | POA: Insufficient documentation

## 2019-12-27 DIAGNOSIS — K219 Gastro-esophageal reflux disease without esophagitis: Secondary | ICD-10-CM | POA: Diagnosis not present

## 2019-12-27 DIAGNOSIS — Z7901 Long term (current) use of anticoagulants: Secondary | ICD-10-CM | POA: Insufficient documentation

## 2019-12-27 DIAGNOSIS — Z950 Presence of cardiac pacemaker: Secondary | ICD-10-CM | POA: Insufficient documentation

## 2019-12-27 DIAGNOSIS — Z888 Allergy status to other drugs, medicaments and biological substances status: Secondary | ICD-10-CM | POA: Insufficient documentation

## 2019-12-27 DIAGNOSIS — I4819 Other persistent atrial fibrillation: Secondary | ICD-10-CM | POA: Diagnosis not present

## 2019-12-27 DIAGNOSIS — I5032 Chronic diastolic (congestive) heart failure: Secondary | ICD-10-CM | POA: Diagnosis not present

## 2019-12-27 DIAGNOSIS — Z79899 Other long term (current) drug therapy: Secondary | ICD-10-CM | POA: Diagnosis not present

## 2019-12-27 DIAGNOSIS — Z8719 Personal history of other diseases of the digestive system: Secondary | ICD-10-CM | POA: Diagnosis not present

## 2019-12-27 DIAGNOSIS — I442 Atrioventricular block, complete: Secondary | ICD-10-CM | POA: Diagnosis not present

## 2019-12-27 DIAGNOSIS — M1990 Unspecified osteoarthritis, unspecified site: Secondary | ICD-10-CM | POA: Diagnosis not present

## 2019-12-27 DIAGNOSIS — Z951 Presence of aortocoronary bypass graft: Secondary | ICD-10-CM | POA: Insufficient documentation

## 2019-12-27 MED ORDER — AMIODARONE HCL 200 MG PO TABS: 200.0000 mg | ORAL_TABLET | Freq: Two times a day (BID) | ORAL | 3 refills | Status: DC

## 2019-12-27 NOTE — Progress Notes (Signed)
Primary Care Physician: Chilton GreathouseAvva, Ravisankar, MD Primary Cardiologist: Dr Tresa EndoKelly Structural Heart: Dr Clifton JamesMcAlhany Primary Electrophysiologist: Dr Graciela HusbandsKlein Referring Physician: Dr Keane ScrapeKlein   Jorge Cherry is a 84 y.o. male with a history of CAD status four-vessel CABG in 1994, severe aortic stenosis status post TAVR on 11/14/2019, HFpEF, CVA, CKD stage III, prior GI bleed, HTN, HLD, GERD, and persistent atrial fibrillation who [facility]. Patient is on Eliquis for a CHADS2VASC score of 6. Patient was found to have afib post TAVR and a Zio patch was placed. He subsequently suffered 2 syncopal events.  He was brought via EMS after [facility]. Review of his Zio patch showedmultiple pauses lasting from 8 to 11 seconds in duration and he underwent PPM implantation with Dr Graciela HusbandsKlein on 11/20/19.   On follow up today, patient is s/p DCCV 12/20/19. He was in SR for about 2 days. He felt minimally improved during those two days. He knew he was back in afib on his smart watch.   Today, he denies symptoms of palpitations, chest pain, orthopnea, PND, lower extremity edema, dizziness, presyncope, syncope, snoring, daytime somnolence, bleeding, or neurologic sequela. The patient is tolerating medications without difficulties and is otherwise without complaint today.    Atrial Fibrillation Risk Factors:  he does not have symptoms or diagnosis of sleep apnea.   he has a BMI of Body mass index is 28.22 kg/m.[name]. Filed Weights   12/27/19 1335  Weight: 84.2 kg    Family History  Problem Relation Age of Onset  . Cancer Mother   . Heart disease Father        died age 82  . Stroke Maternal Grandmother   . Cancer Maternal Grandfather   . Allergic rhinitis Neg Hx   . Angioedema Neg Hx   . Asthma Neg Hx   . Atopy Neg Hx   . Eczema Neg Hx   . Immunodeficiency Neg Hx   . Urticaria Neg Hx      Atrial Fibrillation Management history:  Previous  antiarrhythmic drugs: amiodarone Previous cardioversions: 12/20/19 Previous ablations: none CHADS2VASC score: 6 Anticoagulation history: Eliquis   Past Medical History:  Diagnosis Date  . Arthritis    "just a touch in my hands" (11/24/2016)  . Asthma   . Atherosclerosis of renal artery (HCC)    RENAL DOPPLER, 12/10/2011 - Left renal artery demonstrated narrowing with elevated velocities consistent with a 1-59% diameter reduction  . CKD (chronic kidney disease) stage 3, GFR 30-59 ml/min    "stable now since they backed off the water pills" (11/24/2016)  . Coronary artery disease    a. 1994 s/p CABG x 4 (LIMA-LAD, VG->D2, VG->OM, VG->RCA); b. 02/2004 PCI SVG-D2 (3.5x16 Taxus DES). VG->RCA 100. Sev apical LAD dzs distal to LIMA insertion; c. 11/2016 Cath: LAD 95p/15215m, D2 100ost, LCX 100ost, 70p/m, RCA 100p/m, RPDA fills via L->R collats. VG->RCA 100, VG->D2 20 ost, patent prox stent, 1310m, LIMA->LAD ok, VG->OM3 20p. EF 55%-->Med Rx.  . GERD (gastroesophageal reflux disease)   . High cholesterol   . History of lower GI bleeding    a. 09/2014 GIB due to diverticulosis/diverticulitis.  . Iron deficiency anemia   . Labile Hypertension   . Moderate aortic stenosis    a. 10/2011 Echo:  EF >55%, mild-mod TR, mild-mod AS, mod Ca2+ of AoV leaflets; b. 07/2016 Echo: EF 60-65%, no rwma, Gr1 DD, mod AS [(S) mean grad 21mmHg, peak grad 34mmHg. Valve  area (VTI): 1.33cm^2, (Vmax) 1.44cm^2. Mild MR]; c. 02/2018 Echo: EF 55-60%, no rwma, GR1 DD, Mod AS [Peak Vel (S): 319cm/s, Mean grad (S) , peak grad (S) 57mmHg].  [name] PAF (paroxysmal atrial fibrillation) (HCC)    a. CHA2DS2VASc = 4-->Eliquis.  . S/P TAVR (transcatheter aortic valve replacement) 11/14/2019   s/p TAVR with a 26 mm Edwards S3U via the TF approach by Drs Clifton James & Bartle  . Sleep apnea    Past Surgical History:  Procedure Laterality Date  . CARDIAC CATHETERIZATION  02/27/2004   Coronary intervention and medical management  . CARDIAC  CATHETERIZATION  11/24/2016  . CARDIOVERSION N/A 12/20/2019   Procedure: CARDIOVERSION;  Surgeon: Chilton Si, MD;  Location: [facility] ENDOSCOPY;  Service: Cardiovascular;  Laterality: N/A;  . CATARACT EXTRACTION W/ INTRAOCULAR LENS IMPLANT Left   . CORONARY ANGIOPLASTY  1994 X 2   "before bypass surgery"  . CORONARY ANGIOPLASTY WITH STENT PLACEMENT  03/04/2004   SVG supplying the diagonal vessel stented with a 3.5x49mm Taxus stent post dilated to 4.0 mm  . CORONARY ARTERY BYPASS GRAFT  1994   "CABG X4"  . INGUINAL HERNIA REPAIR Right   . PACEMAKER IMPLANT N/A 11/20/2019   Procedure: PACEMAKER IMPLANT;  Surgeon: Duke Salvia, MD;  Location: [facility] INVASIVE CV LAB;  Service: Cardiovascular;  Laterality: N/A;  . RIGHT HEART CATH AND CORONARY/GRAFT ANGIOGRAPHY N/A 11/24/2016   Procedure: Right Heart Cath and Coronary/Graft Angiography;  Surgeon: Lennette Bihari, MD;  Location[facility] INVASIVE CV LAB;  Service: Cardiovascular;  Laterality: N/A;  . RIGHT/LEFT HEART CATH AND CORONARY/GRAFT ANGIOGRAPHY N/A 10/18/2019   Procedure: RIGHT/LEFT HEART CATH AND CORONARY/GRAFT ANGIOGRAPHY;  Surgeon: Lennette Bihari, MD;  Location: [facility] INVASIVE CV LAB;  Service: Cardiovascular;  Laterality: N/A;  . TEE WITHOUT CARDIOVERSION N/A 11/14/2019   Procedure: TRANSESOPHAGEAL ECHOCARDIOGRAM (TEE);  Surgeon: Kathleene Hazel, MD;  Location: [facility] INVASIVE CV LAB;  Service: Open Heart Surgery;  Laterality: N/A;  . TONSILLECTOMY AND ADENOIDECTOMY    . TRANSCATHETER AORTIC VALVE REPLACEMENT, TRANSFEMORAL N/A 11/14/2019   Procedure: TRANSCATHETER AORTIC VALVE REPLACEMENT, TRANSFEMORAL;  Surgeon: Kathleene Hazel, MD;  Location: [facility] INVASIVE CV LAB;  Service: Open Heart Surgery;  Laterality: N/A;    Current Outpatient Medications  Medication Sig Dispense Refill  . albuterol (VENTOLIN HFA) 108 (90 Base) MCG/ACT inhaler INHALE TWO PUFFS EVERY 4-6 HOURS AS NEEDED FOR COUGH OR WHEEZE (Patient taking differently: Inhale 2 puffs  into the lungs See admin instructions. INHALE TWO PUFFS EVERY 4-6 HOURS AS NEEDED FOR COUGH OR WHEEZE) 8.5 g 1  . amiodarone (PACERONE) 200 MG tablet Take 1 tablet (200 mg total) by mouth 2 (two) times daily. 90 tablet 3  . amLODipine (NORVASC) 2.5 MG tablet TAKE 1 TABLET BY MOUTH TWICE A DAY (Patient taking differently: Take 2.5 mg by mouth in the morning and at bedtime. ) 180 tablet 0  . apixaban (ELIQUIS) 5 MG TABS tablet Take 1 tablet (5 mg total) by mouth 2 (two) times daily. 60 tablet 6  . atorvastatin (LIPITOR) 20 MG tablet Take 1 tablet (20 mg total) by mouth at bedtime. 30 tablet 0  . Azelastine-Fluticasone (DYMISTA) 137-50 MCG/ACT SUSP Place 1 spray into the nose daily as needed (Sinus).    [name] docusate sodium (COLACE) 100 MG capsule Take 100 mg by mouth daily.     [name] ezetimibe (ZETIA) 10 MG tablet Take 1 tablet (10 mg total) by mouth daily. 90 tablet 2  . fenofibrate (TRICOR) 145 MG tablet  Take 0.5 tablets (72.5 mg total) by mouth daily. 90 tablet 1  . fluticasone (FLONASE) 50 MCG/ACT nasal spray Place 2 sprays into both nostrils daily as needed for allergies or rhinitis.     . fluticasone (FLOVENT HFA) 110 MCG/ACT inhaler Inhale 1 puff into the lungs 3 (three) times daily as needed (for seasonal flares). sinus    . ipratropium (ATROVENT) 0.06 % nasal spray Can use two sprays in each nostril every six hours as needed to dry up nose. (Patient taking differently: Place 2 sprays into both nostrils every 6 (six) hours as needed for rhinitis. ) 15 mL 5  . isosorbide mononitrate (IMDUR) 60 MG 24 hr tablet TAKE 1 AND 1/2 TABS (90MG ) BY MOUTH EVERY MORNING AND 1/2 TAB EVERY EVENING (Patient taking differently: Take 30-90 mg by mouth in the morning and at bedtime. TAKE 90 MG BY MOUTH EVERY MORNING AND 30 mgTAB EVERY EVENING) 180 tablet 3  . levothyroxine (SYNTHROID) 25 MCG tablet Take 25 mcg by mouth daily before breakfast.     . loratadine (CLARITIN) 10 MG tablet Take 10 mg by mouth every evening.       . Multiple Vitamins-Minerals (CENTRUM SILVER 50+MEN) TABS Take 1 tablet by mouth at bedtime.    . nitroGLYCERIN (NITROLINGUAL) 0.4 MG/SPRAY spray PLACE 1 SPRAY UNDER THE TONGUE EVERY 5 (FIVE) MINUTES X 3 DOSES AS NEEDED FOR CHEST PAIN. 12 g 1  . Omega-3 Fatty Acids (FISH OIL) 1000 MG CAPS Take 1,000 mg by mouth 2 (two) times daily.     . pantoprazole (PROTONIX) 40 MG tablet TAKE 1 TABLET BY MOUTH EVERY DAY (Patient taking differently: Take 40 mg by mouth daily. ) 90 tablet 1  . ranolazine (RANEXA) 500 MG 12 hr tablet TAKE 1 TABLET BY MOUTH TWICE A DAY (Patient taking differently: Take 500 mg by mouth 2 (two) times daily. ) 180 tablet 3  . vitamin C (ASCORBIC ACID) 500 MG tablet Take 500 mg by mouth at bedtime.     . vitamin E 400 UNIT capsule Take 400 Units by mouth daily.    zinc gluconate 50 MG tablet Take 50 mg by mouth 2 (two) times a week.     No current facility-administered medications for this encounter.    Allergies  Allergen Reactions  . Altace [Ramipril] Swelling and Other (See Comments)    Mouth swelling  . Mucinex [Guaifenesin Er] Hives, Swelling and Other (See Comments)    Mouth swelling  . Contrast Media [Iodinated Diagnostic Agents] Other (See Comments)    Made eyes change each time    Social History   Socioeconomic History  . Marital status: Married    Spouse name: Not on file  . Number of children: 2  . Years of education: Not on file  . Highest education level: Not on file  Occupational History  . Not on file  Tobacco Use  . Smoking status: Never Smoker  . Smokeless tobacco: Never Used  Vaping Use  . Vaping Use: Never used  Substance and Sexual Activity  . Alcohol use: No    Alcohol/week: 0.0 standard drinks  . Drug use: No  . Sexual activity: Not Currently  Other Topics Concern  . Not on file  Social History Narrative   Lives at home in [place] with wife.  Retired from [employer].     Social Determinants of Health   Financial  Resource Strain:   . Difficulty of Paying Living Expenses:   Food  Insecurity:   . Worried About Programme researcher, broadcasting/film/video in the Last Year:   . Barista in the Last Year:   Transportation Needs:   . Freight forwarder (Medical):   [name] Lack of Transportation (Non-Medical):   Physical Activity:   . Days of Exercise per Week:   . Minutes of Exercise per Session:   Stress:   . Feeling of Stress :   Social Connections:   . Frequency of Communication with Friends and Family:   . Frequency of Social Gatherings with Friends and Family:   . Attends Religious Services:   . Active Member of Clubs or Organizations:   . Attends Banker Meetings:   [name] Marital Status:   Intimate Partner Violence:   . Fear of Current or Ex-Partner:   . Emotionally Abused:   [name] Physically Abused:   . Sexually Abused:      ROS- All systems are reviewed and negative except as per the HPI above.  Physical Exam: Vitals:   12/27/19 1335  BP: (!) 170/70  Pulse: 70  Weight: 84.2 kg  Height:  (1.727 m)    GEN- The patient is well appearing elderly male, alert and oriented x 3 today.   HEENT-head normocephalic, atraumatic, sclera clear, conjunctiva pink, hearing intact, trachea midline. Lungs- Clear to ausculation bilaterally, normal work of breathing Heart- irregular rate and rhythm, no murmurs, rubs or gallops  GI- soft, NT, ND, + BS Extremities- no clubbing, cyanosis, or edema MS- no significant deformity or atrophy Skin- no rash or lesion Psych- euthymic mood, full affect Neuro- strength and sensation are intact   Wt Readings from Last 3 Encounters:  12/27/19 84.2 kg  12/11/19 84.4 kg  11/29/19 83.6 kg    EKG today demonstrates afib HR 70, intermittent V pacing, LBBB, QRS 162, QTc 537  Echo 11/15/19 demonstrated  1. Day 1 post TAVR, no paravalvular leak. Normal transaortic gradients  peak/mean 17/10 mmHg. LVEF improved to 55-60%.  2. Left ventricular ejection fraction, by  estimation, is 55 to 60%. The  left ventricle has normal function. There is moderate concentric left  ventricular hypertrophy.  3. Trivial mitral valve regurgitation.  4. The aortic valve has been repaired/replaced. There is a 26 mm Ultra,  stented (TAVR) valve present in the aortic position. Procedure Date:  11/14/2019. Echo findings are consistent with normal structure and  function of the aortic valve prosthesis.  Aortic valve mean gradient measures 10.0 mmHg.  5. There is normal pulmonary artery systolic pressure. The estimated  right ventricular systolic pressure is 29.6 mmHg.   Epic records are reviewed at length today  CHA2DS2-VASc Score = 6  The patient's score is based upon: CHF History: 0 HTN History: 1 Age : 2 Diabetes History: 0 Stroke History: 2 Vascular Disease History: 1 Gender: 0      ASSESSMENT AND PLAN: 1. Persistent Atrial Fibrillation (ICD10:  I48.19) The patient's CHA2DS2-VASc score is 6, indicating a 9.7% annual risk of stroke. S/p DCCV on 12/20/19 We discussed therapeutic options including rhythm and rate control. Patient would like to try one more attempt at SR. Will increase amiodarone to 200 mg BID and repeat DCCV. Continue Eliquis 5 mg BID  2. Secondary Hypercoagulable State (ICD10:  D68.69) The patient is at significant risk for stroke/thromboembolism based upon his CHA2DS2-VASc Score of 6.  Continue Apixaban (Eliquis).   3.CAD S/p CABG 1994 No anginal symptoms.  4. Aortic stenosis S/p TAVR 11/14/19.  5. HTN Elevated  today, will reassess in SR.  6. CHB/symptomatic bradycardia S/p PPM, followed by Dr Graciela Husbands and the device clinic.   Follow up in the AF clinic in 2 weeks.    Jorja Loa PA-C Afib Clinic [facility] [address] [place], [place] [address] [phone] 12/27/2019 2:34 PM

## 2019-12-27 NOTE — Telephone Encounter (Signed)
Rx has been sent to the pharmacy electronically. ° °

## 2019-12-27 NOTE — Patient Instructions (Signed)
Increase amiodarone 200mg  twice a day

## 2020-01-01 NOTE — Progress Notes (Signed)
[facility]   MULTIDISCIPLINARY HEART VALVE CLINIC                                       Cardiology Office Note    Date:  01/03/2020   ID:  Jorge Cherry, DOB August 07, 1935, MRN [id number]  PCP:  Chilton Greathouse, MD  Cardiologist:  Nicki Guadalajara, MD / Dr. Clifton James & Dr. Laneta Simmers (TAVR)  CC: 1 month s/p TAVR  History of Present Illness:  Jorge Cherry is a 84 y.o. male with a history of CAD s/p CABG x4V (1994), PAF on Eliquis, HTN, HLD, GERD, CKD stage IIIA, and severe AS s/p TAVR (11/14/19) c/b delayed CHB s/p PPM who presents to clinic for follow up .   Pt has a history of CAD s/p CABG in 1994. Cardiac cath 10/18/19 with total occlusion of all native vessels and 3 patent grafts (LIMA to LAD, SVG to OM, SVG to Diagonal), The vein graft to the RCA is occluded. Dr. Tresa Endo and Dr. Excell Seltzer decided that medical management of his CAD was appropriate. Echo 08/14/19 with LVEF=55-60%, mild MR. The aortic valve is thickened and calcified with limited leaflet mobility, mean gradient 35 mmHg, peak gradient 67 mmHg, AVA 1.05 cm2, dimensionless index 0.30. He has had recent chest pain and dizziness as well as progressive fatigue, dyspnea on exertion and LE edema.  The patient was evaluated by the multidisciplinary valve team and underwent successful TAVR with a 26 mm Edwards Sapien 3 Ultra THV via the TF approach on 11/14/19. Post operative echo showed EF 55%, normally functioning TAVR with a mean gradient of 10 mmHg and no PVL. Given underlying conduction disease, he was discharged with a Zio patch.   He was then readmitted 7/12-7/13 after two syncopal episodes found to be 2/2 8-11 second pauses. He underwent implantation of a MDT dual chamber PPM on 11/20/2019 by Dr Graciela Husbands. His Eliquis was increased to  BID (appropriate dosing for weight, renal function and age) and aspirin discontinued. He has had persistent afib followed by afib clinic. He underwent DCCV on 12/20/19 with return to afib 2 days after. His  amio was increased with plans to repeat DCCV at a later time.   Today he presents to clinic for follow up. Here with his wife. Doing well. No CP or SOB. Mild LE edema that's chronic. No orthopnea or PND. No dizziness or syncope. No blood in stool or urine. No palpitations.  Does not like being in afib.    Past Medical History:  Diagnosis Date  . Arthritis    "just a touch in my hands" (11/24/2016)  . Asthma   . Atherosclerosis of renal artery (HCC)    RENAL DOPPLER, 12/10/2011 - Left renal artery demonstrated narrowing with elevated velocities consistent with a 1-59% diameter reduction  . CKD (chronic kidney disease) stage 3, GFR 30-59 ml/min    "stable now since they backed off the water pills" (11/24/2016)  . Coronary artery disease    a. 1994 s/p CABG x 4 (LIMA-LAD, VG->D2, VG->OM, VG->RCA); b. 02/2004 PCI SVG-D2 (3.5x16 Taxus DES). VG->RCA 100. Sev apical LAD dzs distal to LIMA insertion; c. 11/2016 Cath: LAD 95p/192m, D2 100ost, LCX 100ost, 70p/m, RCA 100p/m, RPDA fills via L->R collats. VG->RCA 100, VG->D2 20 ost, patent prox stent, 56m, LIMA->LAD ok, VG->OM3 20p. EF 55%-->Med Rx.  . GERD (gastroesophageal reflux disease)   . High cholesterol   .  History of lower GI bleeding    a. 09/2014 GIB due to diverticulosis/diverticulitis.  . Iron deficiency anemia   . Labile Hypertension   . Moderate aortic stenosis    a. 10/2011 Echo:  EF >55%, mild-mod TR, mild-mod AS, mod Ca2+ of AoV leaflets; b. 07/2016 Echo: EF 60-65%, no rwma, Gr1 DD, mod AS [(S) mean grad , peak grad . Valve area (VTI): 1.33cm^2, (Vmax) 1.44cm^2. Mild MR]; c. 02/2018 Echo: EF 55-60%, no rwma, GR1 DD, Mod AS [Peak Vel (S): 319cm/s, Mean grad (S) , peak grad (S) 35mmHg].  [name] PAF (paroxysmal atrial fibrillation) (HCC)    a. CHA2DS2VASc = 4-->Eliquis.  . S/P TAVR (transcatheter aortic valve replacement) 11/14/2019   s/p TAVR with a 26 mm Edwards S3U via the TF approach by Drs Clifton James & Bartle  . Sleep apnea      Past Surgical History:  Procedure Laterality Date  . CARDIAC CATHETERIZATION  02/27/2004   Coronary intervention and medical management  . CARDIAC CATHETERIZATION  11/24/2016  . CARDIOVERSION N/A 12/20/2019   Procedure: CARDIOVERSION;  Surgeon: Chilton Si, MD;  Location: [facility] ENDOSCOPY;  Service: Cardiovascular;  Laterality: N/A;  . CATARACT EXTRACTION W/ INTRAOCULAR LENS IMPLANT Left   . CORONARY ANGIOPLASTY  1994 X 2   "before bypass surgery"  . CORONARY ANGIOPLASTY WITH STENT PLACEMENT  03/04/2004   SVG supplying the diagonal vessel stented with a 3.5x13mm Taxus stent post dilated to 4.0 mm  . CORONARY ARTERY BYPASS GRAFT  1994   "CABG X4"  . INGUINAL HERNIA REPAIR Right   . PACEMAKER IMPLANT N/A 11/20/2019   Procedure: PACEMAKER IMPLANT;  Surgeon: Duke Salvia, MD;  Location: [facility] INVASIVE CV LAB;  Service: Cardiovascular;  Laterality: N/A;  . RIGHT HEART CATH AND CORONARY/GRAFT ANGIOGRAPHY N/A 11/24/2016   Procedure: Right Heart Cath and Coronary/Graft Angiography;  Surgeon: Lennette Bihari, MD;  Location: [facility] INVASIVE CV LAB;  Service: Cardiovascular;  Laterality: N/A;  . RIGHT/LEFT HEART CATH AND CORONARY/GRAFT ANGIOGRAPHY N/A 10/18/2019   Procedure: RIGHT/LEFT HEART CATH AND CORONARY/GRAFT ANGIOGRAPHY;  Surgeon: Lennette Bihari, MD;  Location: [facility] INVASIVE CV LAB;  Service: Cardiovascular;  Laterality: N/A;  . TEE WITHOUT CARDIOVERSION N/A 11/14/2019   Procedure: TRANSESOPHAGEAL ECHOCARDIOGRAM (TEE);  Surgeon: Kathleene Hazel, MD;  Location: [facility] INVASIVE CV LAB;  Service: Open Heart Surgery;  Laterality: N/A;  . TONSILLECTOMY AND ADENOIDECTOMY    . TRANSCATHETER AORTIC VALVE REPLACEMENT, TRANSFEMORAL N/A 11/14/2019   Procedure: TRANSCATHETER AORTIC VALVE REPLACEMENT, TRANSFEMORAL;  Surgeon: Kathleene Hazel, MD;  Location: [facility] INVASIVE CV LAB;  Service: Open Heart Surgery;  Laterality: N/A;    Current Medications: Outpatient Medications Prior to Visit  Medication  Sig Dispense Refill  . albuterol (VENTOLIN HFA) 108 (90 Base) MCG/ACT inhaler INHALE TWO PUFFS EVERY 4-6 HOURS AS NEEDED FOR COUGH OR WHEEZE 8.5 g 1  . amiodarone (PACERONE) 200 MG tablet Take 1 tablet (200 mg total) by mouth 2 (two) times daily. 90 tablet 3  . amLODipine (NORVASC) 2.5 MG tablet TAKE 1 TABLET BY MOUTH TWICE A DAY 180 tablet 0  . apixaban (ELIQUIS) 5 MG TABS tablet Take 1 tablet (5 mg total) by mouth 2 (two) times daily. 60 tablet 6  . atorvastatin (LIPITOR) 20 MG tablet Take 1 tablet (20 mg total) by mouth at bedtime. 30 tablet 0  . Azelastine-Fluticasone (DYMISTA) 137-50 MCG/ACT SUSP Place 1 spray into the nose daily as needed (Sinus).    [name] docusate sodium (COLACE) 100 MG capsule Take 100  mg by mouth daily.     [name] ezetimibe (ZETIA) 10 MG tablet Take 1 tablet (10 mg total) by mouth daily. 90 tablet 2  . fenofibrate (TRICOR) 145 MG tablet Take 0.5 tablets (72.5 mg total) by mouth daily. 90 tablet 1  . fluticasone (FLONASE) 50 MCG/ACT nasal spray Place 2 sprays into both nostrils daily as needed for allergies or rhinitis.     . fluticasone (FLOVENT HFA) 110 MCG/ACT inhaler Inhale 1 puff into the lungs 3 (three) times daily as needed (for seasonal flares). sinus    . ipratropium (ATROVENT) 0.06 % nasal spray Can use two sprays in each nostril every six hours as needed to dry up nose. 15 mL 5  . isosorbide mononitrate (IMDUR) 60 MG 24 hr tablet TAKE 1 AND 1/2 TABS (90MG ) BY MOUTH EVERY MORNING AND 1/2 TAB EVERY EVENING 180 tablet 3  . levothyroxine (SYNTHROID) 25 MCG tablet Take 25 mcg by mouth daily before breakfast.     . loratadine (CLARITIN) 10 MG tablet Take 10 mg by mouth every evening.     . Multiple Vitamins-Minerals (CENTRUM SILVER 50+MEN) TABS Take 1 tablet by mouth at bedtime.    . nitroGLYCERIN (NITROLINGUAL) 0.4 MG/SPRAY spray PLACE 1 SPRAY UNDER THE TONGUE EVERY 5 (FIVE) MINUTES X 3 DOSES AS NEEDED FOR CHEST PAIN. 12 g 1  . Omega-3 Fatty Acids (FISH OIL) 1000 MG CAPS Take  1,000 mg by mouth 2 (two) times daily.     . pantoprazole (PROTONIX) 40 MG tablet TAKE 1 TABLET BY MOUTH EVERY DAY 90 tablet 1  . ranolazine (RANEXA) 500 MG 12 hr tablet TAKE 1 TABLET BY MOUTH TWICE A DAY 180 tablet 3  . vitamin C (ASCORBIC ACID) 500 MG tablet Take 500 mg by mouth at bedtime.     . vitamin E 400 UNIT capsule Take 400 Units by mouth daily.    zinc gluconate 50 MG tablet Take 50 mg by mouth 2 (two) times a week.     No facility-administered medications prior to visit.     Allergies:   Altace [ramipril], Mucinex [guaifenesin er], and Contrast media [iodinated diagnostic agents]   Social History   Socioeconomic History  . Marital status: Married    Spouse name: Not on file  . Number of children: 2  . Years of education: Not on file  . Highest education level: Not on file  Occupational History  . Not on file  Tobacco Use  . Smoking status: Never Smoker  . Smokeless tobacco: Never Used  Vaping Use  . Vaping Use: Never used  Substance and Sexual Activity  . Alcohol use: No    Alcohol/week: 0.0 standard drinks  . Drug use: No  . Sexual activity: Not Currently  Other Topics Concern  . Not on file  Social History Narrative   Lives at home in [place] with wife.  Retired from [employer].     Social Determinants of Health   Financial Resource Strain:   . Difficulty of Paying Living Expenses: Not on file  Food Insecurity:   . Worried About [employer] in the Last Year: Not on file  . Ran Out of Food in the Last Year: Not on file  Transportation Needs:   . Lack of Transportation (Medical): Not on file  . Lack of Transportation (Non-Medical): Not on file  Physical Activity:   . Days of Exercise per Week: Not on file  . Minutes of Exercise per Session:  Not on file  Stress:   . Feeling of Stress : Not on file  Social Connections:   . Frequency of Communication with Friends and Family: Not on file  . Frequency of Social Gatherings with  Friends and Family: Not on file  . Attends Religious Services: Not on file  . Active Member of Clubs or Organizations: Not on file  . Attends BankerClub or Organization Meetings: Not on file  . Marital Status: Not on file     Family History:  The patient's family history includes Cancer in his maternal grandfather and mother; Heart disease in his father; Stroke in his maternal grandmother.     ROS:   Please see the history of present illness.    ROS All other systems reviewed and are negative.   PHYSICAL EXAM:   VS:  BP (!) 148/80   Pulse 60   Ht 5\' 8"  (1.727 m)   Wt 187 lb 3.2 oz (84.9 kg)   SpO2 97%   BMI 28.46 kg/m    GEN: Well nourished, well developed, in no acute distress HEENT: normal Neck: no JVD or masses Cardiac: irreg irreg; soft flow murmur. No rubs, or gallops. 1+ biltateral Respiratory:  clear to auscultation bilaterally, normal work of breathing GI: soft, nontender, nondistended, + BS MS: no deformity or atrophy Skin: warm and dry, no rash Neuro:  Alert and Oriented x 3, Strength and sensation are intact Psych: euthymic mood, full affect   Wt Readings from Last 3 Encounters:  01/03/20 187 lb 3.2 oz (84.9 kg)  12/27/19 185 lb 9.6 oz (84.2 kg)  12/11/19 186 lb (84.4 kg)      Studies/Labs Reviewed:   EKG:  EKG is NOT ordered today.   Recent Labs: 11/10/2019: ALT 50; B Natriuretic Peptide 371.6 11/15/2019: Magnesium 1.8 11/20/2019: TSH 1.744 12/11/2019: BUN 17; Creatinine, Ser 1.39; Hemoglobin 11.1; Platelets 122; Potassium 4.0; Sodium 141   Lipid Panel    Component Value Date/Time   CHOL 106 07/01/2016 1345   CHOL 137 04/09/2014 1115   TRIG 120 07/01/2016 1345   TRIG 182 (H) 04/09/2014 1115   HDL 35 (L) 07/01/2016 1345   HDL 32 (L) 04/09/2014 1115   CHOLHDL 3.0 07/01/2016 1345   VLDL 24 07/01/2016 1345   LDLCALC 47 07/01/2016 1345   LDLCALC 69 04/09/2014 1115    Additional studies/ records that were reviewed today include:  TAVR OPERATIVE  NOTE   Date of Procedure:11/14/2019  Preoperative Diagnosis:Severe Aortic Stenosis   Postoperative Diagnosis:Same   Procedure:   Transcatheter Aortic Valve Replacement - PercutaneousRightTransfemoral Approach Edwards Sapien 3 Ultra THV (size 26mm, model # 9750TFX, serial # [id number])  Co-Surgeons:Bryan Jennefer BravoK. Bartle, MD andChristopher Clifton JamesMcAlhany, MD   Anesthesiologist:R. Sampson GoonFitzgerald, MD  Lynn ItoEchocardiographer:K. Nelson, MD  Pre-operative Echo Findings: ? Severe aortic stenosis ? Mildleft ventricular systolic dysfunction  Post-operative Echo Findings: ? Noparavalvular leak ? Mildleft ventricular systolic dysfunction  _____________    Echo 11/15/2019: IMPRESSIONS 1. Day 1 post TAVR, no paravalvular leak. Normal transaortic gradients  peak/mean 17/10 mmHg. LVEF improved to 55-60%.  2. Left ventricular ejection fraction, by estimation, is 55 to 60%. The  left ventricle has normal function. There is moderate concentric left  ventricular hypertrophy.  3. Trivial mitral valve regurgitation.  4. The aortic valve has been repaired/replaced. There is a 26 mm Ultra,  stented (TAVR) valve present in the aortic position. Procedure Date:  11/14/2019. Echo findings are consistent with normal structure and  function of the aortic valve prosthesis.  Aortic valve mean gradient measures 10.0 mmHg.  5. There is normal pulmonary artery systolic pressure. The estimated  right ventricular systolic pressure is 29.6 mmHg.    __________________  Echo 01/03/20 IMPRESSIONS  1. Left ventricular ejection fraction, by estimation, is 60 to 65%. The left ventricle has normal function. The left ventricle has no regional wall motion abnormalities. There is moderate left ventricular hypertrophy. Left ventricular diastolic  parameters are indeterminate.  2. Right ventricular  systolic function is normal. The right ventricular size is normal. There is normal pulmonary artery systolic pressure.  3. Left atrial size was moderately dilated.  4. The mitral valve is normal in structure. Mild mitral valve regurgitation. No evidence of mitral stenosis.  5. Tricuspid valve regurgitation is moderate.  6. Mild perivalvular leak. The aortic valve has been repaired/replaced. Aortic valve regurgitation is mild. No aortic stenosis is present. There is a 26 mm Edwards Sapien prosthetic (TAVR) valve present in the aortic position. Procedure Date:  11/14/2019. Aortic valve mean gradient measures 13.0 mmHg. Aortic valve Vmax measures 2.35 m/s.  7. Aortic dilatation noted. There is mild dilatation of the ascending aorta measuring 38 mm.  8. The inferior vena cava is normal in size with greater than 50% respiratory variability, suggesting right atrial pressure of 3 mmHg.   ASSESSMENT & PLAN:   Severe AS s/p TAVR: echo today shows EF 60%, normally functioning TAVR with a mean gradient of 13 mm hg and mild PVL. He has NYHA class I symtpoms. SBE prophylaxis discussed; the patient is edentulous and does not go to the dentist. Continue on Eliquis  BID alone. I will see him back in 1 year for follow up and echo.   CHB s/p PPM: followed by Dr. Graciela Husbands.   Persistent atrial fibrillation: sounds irregular exam today. He underwent DCCV on 12/20/19 with return to afib 2 days after. His amio was increased with plans to repeat DCCV at a later time. Continue follow up with Jorja Loa.   Chronic diastolic CHF: appears euvolemic on exam off diuretics.   Renal lesion: pre TAVR CT showed multiple indeterminate lesions in the kidneys, most concerning of which measures 2.2 x 1.9 cm in the interpolar region of the left kidney. Further characterization with nonemergent abdominal MRI with and without IV gadolinium is strongly recommended in the near future to exclude the possibility of underlying renal  neoplasm. Will have this set up today. BMET given need for contrast dye  BPH: pt has symptoms of BPH, will send to [facility].   Medication Adjustments/Labs and Tests Ordered: Current medicines are reviewed at length with the patient today.  Concerns regarding medicines are outlined above.  Medication changes, Labs and Tests ordered today are listed in the Patient Instructions below. Patient Instructions  Medication Instructions:  Your physician recommends that you continue on your current medications as directed. Please refer to the Current Medication list given to you today.  *If you need a refill on your cardiac medications before your next appointment, please call your pharmacy*   Lab Work: BMET today  If you have labs (blood work) drawn today and your tests are completely normal, you will receive your results only by: [name] MyChart Message (if you have MyChart) OR . A paper copy in the mail If you have any lab test that is abnormal or we need to change your treatment, we will call you to review the results.   Testing/Procedures: Your physician recommends that you have an abdominal MRI performed.  Follow-Up: At [facility], you and your health needs are our priority.  As part of our continuing mission to provide you with exceptional heart care, we have created designated Provider Care Teams.  These Care Teams include your primary Cardiologist (physician) and Advanced Practice Providers (APPs -  Physician Assistants and Nurse Practitioners) who all work together to provide you with the care you need, when you need it.  We recommend signing up for the patient portal called "MyChart".  Sign up information is provided on this After Visit Summary.  MyChart is used to connect with patients for Virtual Visits (Telemedicine).  Patients are able to view lab/test results, encounter notes, upcoming appointments, etc.  Non-urgent messages can be sent to your provider as well.   To learn  more about what you can do with MyChart, go to [email or web address].    Your next appointment:   3 month(s)  The format for your next appointment:   In Person  Provider:   You may see Nicki Guadalajara, MD or one of the following Advanced Practice Providers on your designated Care Team:    Azalee Course, PA-C  Micah Flesher, PA-C or   Judy Pimple, [place]    Other Instructions  You have been referred to Kasandra Knudsen, MD at [facility].  Their office will contact you to set up an appointment.      Signed, Cline Crock, PA-C  01/03/2020 2:51 PM    [facility] [address] [place], [place], [place]  [address] Phone: [phone]; Fax: [phone]

## 2020-01-03 ENCOUNTER — Encounter: Payer: Self-pay | Admitting: Physician Assistant

## 2020-01-03 ENCOUNTER — Ambulatory Visit: Payer: Medicare PPO | Admitting: Physician Assistant

## 2020-01-03 ENCOUNTER — Other Ambulatory Visit: Payer: Self-pay

## 2020-01-03 ENCOUNTER — Ambulatory Visit (HOSPITAL_COMMUNITY): Payer: Medicare PPO | Attending: Cardiology

## 2020-01-03 VITALS — BP 140/75 | HR 60 | Ht 68.0 in | Wt 187.2 lb

## 2020-01-03 DIAGNOSIS — N289 Disorder of kidney and ureter, unspecified: Secondary | ICD-10-CM

## 2020-01-03 DIAGNOSIS — I480 Paroxysmal atrial fibrillation: Secondary | ICD-10-CM | POA: Diagnosis not present

## 2020-01-03 DIAGNOSIS — Z952 Presence of prosthetic heart valve: Secondary | ICD-10-CM | POA: Diagnosis not present

## 2020-01-03 DIAGNOSIS — I5032 Chronic diastolic (congestive) heart failure: Secondary | ICD-10-CM

## 2020-01-03 DIAGNOSIS — N2889 Other specified disorders of kidney and ureter: Secondary | ICD-10-CM | POA: Diagnosis not present

## 2020-01-03 DIAGNOSIS — Z950 Presence of cardiac pacemaker: Secondary | ICD-10-CM | POA: Diagnosis not present

## 2020-01-03 DIAGNOSIS — N401 Enlarged prostate with lower urinary tract symptoms: Secondary | ICD-10-CM | POA: Diagnosis not present

## 2020-01-03 LAB — ECHOCARDIOGRAM COMPLETE
AR max vel: 1.28 cm2
AV Area VTI: 1.32 cm2
AV Area mean vel: 1.1 cm2
AV Mean grad: 13 mmHg
AV Peak grad: 22.1 mmHg
Ao pk vel: 2.35 m/s
Area-P 1/2: 3.38 cm2
P 1/2 time: 415 msec
S' Lateral: 4.4 cm

## 2020-01-03 NOTE — Patient Instructions (Addendum)
Medication Instructions:  Your physician recommends that you continue on your current medications as directed. Please refer to the Current Medication list given to you today.  *If you need a refill on your cardiac medications before your next appointment, please call your pharmacy*   Lab Work: BMET today  If you have labs (blood work) drawn today and your tests are completely normal, you will receive your results only by: [name] MyChart Message (if you have MyChart) OR . A paper copy in the mail If you have any lab test that is abnormal or we need to change your treatment, we will call you to review the results.   Testing/Procedures: Your physician recommends that you have an abdominal MRI performed.    Follow-Up: At [facility], you and your health needs are our priority.  As part of our continuing mission to provide you with exceptional heart care, we have created designated Provider Care Teams.  These Care Teams include your primary Cardiologist (physician) and Advanced Practice Providers (APPs -  Physician Assistants and Nurse Practitioners) who all work together to provide you with the care you need, when you need it.  We recommend signing up for the patient portal called "MyChart".  Sign up information is provided on this After Visit Summary.  MyChart is used to connect with patients for Virtual Visits (Telemedicine).  Patients are able to view lab/test results, encounter notes, upcoming appointments, etc.  Non-urgent messages can be sent to your provider as well.   To learn more about what you can do with MyChart, go to [email or web address].    Your next appointment:   3 month(s)  The format for your next appointment:   In Person  Provider:   You may see Nicki Guadalajara, MD or one of the following Advanced Practice Providers on your designated Care Team:    Azalee Course, PA-C  Micah Flesher, PA-C or   Judy Pimple, [place]    Other Instructions  You have been referred to  Kasandra Knudsen, MD at [facility].  Their office will contact you to set up an appointment.

## 2020-01-04 LAB — BASIC METABOLIC PANEL
BUN/Creatinine Ratio: 14 (ref 10–24)
BUN: 19 mg/dL (ref 8–27)
CO2: 20 mmol/L (ref 20–29)
Calcium: 8.8 mg/dL (ref 8.6–10.2)
Chloride: 108 mmol/L — ABNORMAL HIGH (ref 96–106)
Creatinine, Ser: 1.36 mg/dL — ABNORMAL HIGH (ref 0.76–1.27)
GFR calc Af Amer: 56 mL/min/{1.73_m2} — ABNORMAL LOW (ref >59)
GFR calc non Af Amer: 48 mL/min/{1.73_m2} — ABNORMAL LOW (ref >59)
Glucose: 97 mg/dL (ref 65–99)
Potassium: 3.9 mmol/L (ref 3.5–5.2)
Sodium: 143 mmol/L (ref 134–144)

## 2020-01-10 ENCOUNTER — Other Ambulatory Visit: Payer: Self-pay

## 2020-01-10 ENCOUNTER — Ambulatory Visit (HOSPITAL_COMMUNITY)
Admission: RE | Admit: 2020-01-10 | Discharge: 2020-01-10 | Disposition: A | Discharge status: Home / Self Care | Payer: Medicare PPO | Source: Ambulatory Visit | Attending: Physician Assistant | Admitting: Physician Assistant

## 2020-01-10 ENCOUNTER — Encounter (HOSPITAL_COMMUNITY): Payer: Self-pay | Admitting: Physician Assistant

## 2020-01-10 VITALS — BP 152/70 | HR 67 | Ht 68.0 in | Wt 184.8 lb

## 2020-01-10 DIAGNOSIS — I2510 Atherosclerotic heart disease of native coronary artery without angina pectoris: Secondary | ICD-10-CM | POA: Insufficient documentation

## 2020-01-10 DIAGNOSIS — I350 Nonrheumatic aortic (valve) stenosis: Secondary | ICD-10-CM | POA: Insufficient documentation

## 2020-01-10 DIAGNOSIS — Z8249 Family history of ischemic heart disease and other diseases of the circulatory system: Secondary | ICD-10-CM | POA: Insufficient documentation

## 2020-01-10 DIAGNOSIS — Z7901 Long term (current) use of anticoagulants: Secondary | ICD-10-CM | POA: Diagnosis not present

## 2020-01-10 DIAGNOSIS — N183 Chronic kidney disease, stage 3 unspecified: Secondary | ICD-10-CM | POA: Insufficient documentation

## 2020-01-10 DIAGNOSIS — E785 Hyperlipidemia, unspecified: Secondary | ICD-10-CM | POA: Diagnosis not present

## 2020-01-10 DIAGNOSIS — G4730 Sleep apnea, unspecified: Secondary | ICD-10-CM | POA: Insufficient documentation

## 2020-01-10 DIAGNOSIS — Z955 Presence of coronary angioplasty implant and graft: Secondary | ICD-10-CM | POA: Insufficient documentation

## 2020-01-10 DIAGNOSIS — I130 Hypertensive heart and chronic kidney disease with heart failure and stage 1 through stage 4 chronic kidney disease, or unspecified chronic kidney disease: Secondary | ICD-10-CM | POA: Insufficient documentation

## 2020-01-10 DIAGNOSIS — Z950 Presence of cardiac pacemaker: Secondary | ICD-10-CM | POA: Diagnosis not present

## 2020-01-10 DIAGNOSIS — D6869 Other thrombophilia: Secondary | ICD-10-CM | POA: Diagnosis not present

## 2020-01-10 DIAGNOSIS — Z79899 Other long term (current) drug therapy: Secondary | ICD-10-CM | POA: Insufficient documentation

## 2020-01-10 DIAGNOSIS — Z8673 Personal history of transient ischemic attack (TIA), and cerebral infarction without residual deficits: Secondary | ICD-10-CM | POA: Insufficient documentation

## 2020-01-10 DIAGNOSIS — I5030 Unspecified diastolic (congestive) heart failure: Secondary | ICD-10-CM | POA: Insufficient documentation

## 2020-01-10 DIAGNOSIS — K219 Gastro-esophageal reflux disease without esophagitis: Secondary | ICD-10-CM | POA: Insufficient documentation

## 2020-01-10 DIAGNOSIS — Z951 Presence of aortocoronary bypass graft: Secondary | ICD-10-CM | POA: Insufficient documentation

## 2020-01-10 DIAGNOSIS — I4819 Other persistent atrial fibrillation: Secondary | ICD-10-CM | POA: Insufficient documentation

## 2020-01-10 DIAGNOSIS — Z952 Presence of prosthetic heart valve: Secondary | ICD-10-CM | POA: Insufficient documentation

## 2020-01-10 DIAGNOSIS — I442 Atrioventricular block, complete: Secondary | ICD-10-CM | POA: Diagnosis not present

## 2020-01-10 DIAGNOSIS — Z79890 Hormone replacement therapy (postmenopausal): Secondary | ICD-10-CM | POA: Insufficient documentation

## 2020-01-10 DIAGNOSIS — Z823 Family history of stroke: Secondary | ICD-10-CM | POA: Diagnosis not present

## 2020-01-10 LAB — CBC
HCT: 37.1 % — ABNORMAL LOW (ref 39.0–52.0)
Hemoglobin: 11.6 g/dL — ABNORMAL LOW (ref 13.0–17.0)
MCH: 30.1 pg (ref 26.0–34.0)
MCHC: 31.3 g/dL (ref 30.0–36.0)
MCV: 96.4 fL (ref 80.0–100.0)
Platelets: 141 10*3/uL — ABNORMAL LOW (ref 150–400)
RBC: 3.85 MIL/uL — ABNORMAL LOW (ref 4.22–5.81)
RDW: 13.8 % (ref 11.5–15.5)
WBC: 5.7 10*3/uL (ref 4.0–10.5)
nRBC: 0 % (ref 0.0–0.2)

## 2020-01-10 NOTE — Patient Instructions (Signed)
Cardioversion scheduled for Thursday, September 9th  - Arrive at the [facility] and go to admitting at 9:30AM  - Do not eat or drink anything after midnight the night prior to your procedure.  - Take all your morning medication (except diabetic medications) with a sip of water prior to arrival.  - You will not be able to drive home after your procedure.  - Do NOT miss any doses of your blood thinner - if you should miss a dose please notify our office immediately.  Continue Amiodarone at 200mg  twice a day until cardioversion then reduce to 200mg  once a day

## 2020-01-10 NOTE — H&P (View-Only) (Signed)
Primary Care Physician: Chilton Greathouse, MD Primary Cardiologist: Dr Tresa Endo Structural Heart: Dr Clifton James Primary Electrophysiologist: Dr Graciela Husbands Referring Physician: Dr Keane Scrape is a 84 y.o. male with a history of CAD status four-vessel CABG in 1994, severe aortic stenosis status post TAVR on 11/14/2019, HFpEF, CVA, CKD stage III, prior GI bleed, HTN, HLD, GERD, and persistent atrial fibrillation who presents for follow up in the [facility]. Patient is on Eliquis for a CHADS2VASC score of 6. Patient was found to have afib post TAVR and a Zio patch was placed. He subsequently suffered 2 syncopal events.  He was brought via EMS after the last to [facility]. Review of his Zio patch showedmultiple pauses lasting from 8 to 11 seconds in duration and he underwent PPM implantation with Dr Graciela Husbands on 11/20/19. Patient is s/p DCCV 12/20/19. He was in SR for about 2 days. He felt minimally improved during those two days. He knew he was back in afib on his smart watch.   On follow up today, patient reports he has done well since his last visit. He actually reports that he feels his strength and stamina are improving. He denies any bleeding issues on anticoagulation or missed doses. His smart watch has shown persistent afib.  Today, he denies symptoms of palpitations, chest pain, orthopnea, PND, lower extremity edema, dizziness, presyncope, syncope, snoring, daytime somnolence, bleeding, or neurologic sequela. The patient is tolerating medications without difficulties and is otherwise without complaint today.    Atrial Fibrillation Risk Factors:  he does not have symptoms or diagnosis of sleep apnea.   he has a BMI of Body mass index is 28.1 kg/m.[name] Filed Weights   01/10/20 1407  Weight: 83.8 kg    Family History  Problem Relation Age of Onset  . Cancer Mother   . Heart disease Father        died age 50  . Stroke Maternal Grandmother   . Cancer Maternal  Grandfather   . Allergic rhinitis Neg Hx   . Angioedema Neg Hx   . Asthma Neg Hx   . Atopy Neg Hx   . Eczema Neg Hx   . Immunodeficiency Neg Hx   . Urticaria Neg Hx      Atrial Fibrillation Management history:  Previous antiarrhythmic drugs: amiodarone Previous cardioversions: 12/20/19 Previous ablations: none CHADS2VASC score: 6 Anticoagulation history: Eliquis   Past Medical History:  Diagnosis Date  . Arthritis    "just a touch in my hands" (11/24/2016)  . Asthma   . Atherosclerosis of renal artery (HCC)    RENAL DOPPLER, 12/10/2011 - Left renal artery demonstrated narrowing with elevated velocities consistent with a 1-59% diameter reduction  . CKD (chronic kidney disease) stage 3, GFR 30-59 ml/min    "stable now since they backed off the water pills" (11/24/2016)  . Coronary artery disease    a. 1994 s/p CABG x 4 (LIMA-LAD, VG->D2, VG->OM, VG->RCA); b. 02/2004 PCI SVG-D2 (3.5x16 Taxus DES). VG->RCA 100. Sev apical LAD dzs distal to LIMA insertion; c. 11/2016 Cath: LAD 95p/167m, D2 100ost, LCX 100ost, 70p/m, RCA 100p/m, RPDA fills via L->R collats. VG->RCA 100, VG->D2 20 ost, patent prox stent, 18m, LIMA->LAD ok, VG->OM3 20p. EF 55%-->Med Rx.  . GERD (gastroesophageal reflux disease)   . High cholesterol   . History of lower GI bleeding    a. 09/2014 GIB due to diverticulosis/diverticulitis.  . Iron deficiency anemia   . Labile Hypertension   . Moderate aortic  stenosis    a. 10/2011 Echo:  EF >55%, mild-mod TR, mild-mod AS, mod Ca2+ of AoV leaflets; b. 07/2016 Echo: EF 60-65%, no rwma, Gr1 DD, mod AS [(S) mean grad , peak grad . Valve area (VTI): 1.33cm^2, (Vmax) 1.44cm^2. Mild MR]; c. 02/2018 Echo: EF 55-60%, no rwma, GR1 DD, Mod AS [Peak Vel (S): 319cm/s, Mean grad (S) , peak grad (S) 32mmHg].  [name] PAF (paroxysmal atrial fibrillation) (HCC)    a. CHA2DS2VASc = 4-->Eliquis.  . S/P TAVR (transcatheter aortic valve replacement) 11/14/2019   s/p TAVR with a 26 mm  Edwards S3U via the TF approach by Drs Clifton James & Bartle  . Sleep apnea    Past Surgical History:  Procedure Laterality Date  . CARDIAC CATHETERIZATION  02/27/2004   Coronary intervention and medical management  . CARDIAC CATHETERIZATION  11/24/2016  . CARDIOVERSION N/A 12/20/2019   Procedure: CARDIOVERSION;  Surgeon: Chilton Si, MD;  Location: [facility] ENDOSCOPY;  Service: Cardiovascular;  Laterality: N/A;  . CATARACT EXTRACTION W/ INTRAOCULAR LENS IMPLANT Left   . CORONARY ANGIOPLASTY  1994 X 2   "before bypass surgery"  . CORONARY ANGIOPLASTY WITH STENT PLACEMENT  03/04/2004   SVG supplying the diagonal vessel stented with a 3.5x56mm Taxus stent post dilated to 4.0 mm  . CORONARY ARTERY BYPASS GRAFT  1994   "CABG X4"  . INGUINAL HERNIA REPAIR Right   . PACEMAKER IMPLANT N/A 11/20/2019   Procedure: PACEMAKER IMPLANT;  Surgeon: Duke Salvia, MD;  Location: [facility] INVASIVE CV LAB;  Service: Cardiovascular;  Laterality: N/A;  . RIGHT HEART CATH AND CORONARY/GRAFT ANGIOGRAPHY N/A 11/24/2016   Procedure: Right Heart Cath and Coronary/Graft Angiography;  Surgeon: Lennette Bihari, MD;  Location: [facility] INVASIVE CV LAB;  Service: Cardiovascular;  Laterality: N/A;  . RIGHT/LEFT HEART CATH AND CORONARY/GRAFT ANGIOGRAPHY N/A 10/18/2019   Procedure: RIGHT/LEFT HEART CATH AND CORONARY/GRAFT ANGIOGRAPHY;  Surgeon: Lennette Bihari, MD;  Location: [facility] INVASIVE CV LAB;  Service: Cardiovascular;  Laterality: N/A;  . TEE WITHOUT CARDIOVERSION N/A 11/14/2019   Procedure: TRANSESOPHAGEAL ECHOCARDIOGRAM (TEE);  Surgeon: Kathleene Hazel, MD;  Location: [facility] INVASIVE CV LAB;  Service: Open Heart Surgery;  Laterality: N/A;  . TONSILLECTOMY AND ADENOIDECTOMY    . TRANSCATHETER AORTIC VALVE REPLACEMENT, TRANSFEMORAL N/A 11/14/2019   Procedure: TRANSCATHETER AORTIC VALVE REPLACEMENT, TRANSFEMORAL;  Surgeon: Kathleene Hazel, MD;  Location: [facility] INVASIVE CV LAB;  Service: Open Heart Surgery;  Laterality: N/A;     Current Outpatient Medications  Medication Sig Dispense Refill  . albuterol (VENTOLIN HFA) 108 (90 Base) MCG/ACT inhaler INHALE TWO PUFFS EVERY 4-6 HOURS AS NEEDED FOR COUGH OR WHEEZE 8.5 g 1  . amiodarone (PACERONE) 200 MG tablet Take 1 tablet (200 mg total) by mouth 2 (two) times daily. 90 tablet 3  . amLODipine (NORVASC) 2.5 MG tablet TAKE 1 TABLET BY MOUTH TWICE A DAY 180 tablet 0  . apixaban (ELIQUIS) 5 MG TABS tablet Take 1 tablet (5 mg total) by mouth 2 (two) times daily. 60 tablet 6  . atorvastatin (LIPITOR) 20 MG tablet Take 1 tablet (20 mg total) by mouth at bedtime. 30 tablet 0  . Azelastine-Fluticasone (DYMISTA) 137-50 MCG/ACT SUSP Place 1 spray into the nose daily as needed (Sinus).    [name] docusate sodium (COLACE) 100 MG capsule Take 100 mg by mouth daily.     [name] ezetimibe (ZETIA) 10 MG tablet Take 1 tablet (10 mg total) by mouth daily. 90 tablet 2  . fenofibrate (TRICOR) 145 MG tablet Take  0.5 tablets (72.5 mg total) by mouth daily. 90 tablet 1  . fluticasone (FLONASE) 50 MCG/ACT nasal spray Place 2 sprays into both nostrils daily as needed for allergies or rhinitis.     . fluticasone (FLOVENT HFA) 110 MCG/ACT inhaler Inhale 1 puff into the lungs 3 (three) times daily as needed (for seasonal flares). sinus    . ipratropium (ATROVENT) 0.06 % nasal spray Can use two sprays in each nostril every six hours as needed to dry up nose. 15 mL 5  . isosorbide mononitrate (IMDUR) 60 MG 24 hr tablet TAKE 1 AND 1/2 TABS (90MG) BY MOUTH EVERY MORNING AND 1/2 TAB EVERY EVENING 180 tablet 3  . levothyroxine (SYNTHROID) 25 MCG tablet Take 25 mcg by mouth daily before breakfast.     . loratadine (CLARITIN) 10 MG tablet Take 10 mg by mouth every evening.     . Multiple Vitamins-Minerals (CENTRUM SILVER 50+MEN) TABS Take 1 tablet by mouth at bedtime.    . nitroGLYCERIN (NITROLINGUAL) 0.4 MG/SPRAY spray PLACE 1 SPRAY UNDER THE TONGUE EVERY 5 (FIVE) MINUTES X 3 DOSES AS NEEDED FOR CHEST PAIN. 12 g 1  .  Omega-3 Fatty Acids (FISH OIL) 1000 MG CAPS Take 1,000 mg by mouth 2 (two) times daily.     . pantoprazole (PROTONIX) 40 MG tablet TAKE 1 TABLET BY MOUTH EVERY DAY 90 tablet 1  . ranolazine (RANEXA) 500 MG 12 hr tablet TAKE 1 TABLET BY MOUTH TWICE A DAY 180 tablet 3  . vitamin C (ASCORBIC ACID) 500 MG tablet Take 500 mg by mouth at bedtime.     . vitamin E 400 UNIT capsule Take 400 Units by mouth daily.    . zinc gluconate 50 MG tablet Take 50 mg by mouth 2 (two) times a week.     No current facility-administered medications for this encounter.    Allergies  Allergen Reactions  . Altace [Ramipril] Swelling and Other (See Comments)    Mouth swelling  . Mucinex [Guaifenesin Er] Hives, Swelling and Other (See Comments)    Mouth swelling  . Contrast Media [Iodinated Diagnostic Agents] Other (See Comments)    Made eyes change each time    Social History   Socioeconomic History  . Marital status: Married    Spouse name: Not on file  . Number of children: 2  . Years of education: Not on file  . Highest education level: Not on file  Occupational History  . Not on file  Tobacco Use  . Smoking status: Never Smoker  . Smokeless tobacco: Never Used  Vaping Use  . Vaping Use: Never used  Substance and Sexual Activity  . Alcohol use: No    Alcohol/week: 0.0 standard drinks  . Drug use: No  . Sexual activity: Not Currently  Other Topics Concern  . Not on file  Social History Narrative   Lives at home in [place] with wife.  Retired from [employer].     Social Determinants of Health   Financial Resource Strain:   . Difficulty of Paying Living Expenses: Not on file  Food Insecurity:   . Worried About Running Out of Food in the Last Year: Not on file  . Ran Out of Food in the Last Year: Not on file  Transportation Needs:   . Lack of Transportation (Medical): Not on file  . Lack of Transportation (Non-Medical): Not on file  Physical Activity:   . Days of Exercise  per Week: Not on file  .   Minutes of Exercise per Session: Not on file  Stress:   . Feeling of Stress : Not on file  Social Connections:   . Frequency of Communication with Friends and Family: Not on file  . Frequency of Social Gatherings with Friends and Family: Not on file  . Attends Religious Services: Not on file  . Active Member of Clubs or Organizations: Not on file  . Attends BankerClub or Organization Meetings: Not on file  . Marital Status: Not on file  Intimate Partner Violence:   . Fear of Current or Ex-Partner: Not on file  . Emotionally Abused: Not on file  . Physically Abused: Not on file  . Sexually Abused: Not on file     ROS- All systems are reviewed and negative except as per the HPI above.  Physical Exam: Vitals:   01/10/20 1407  BP: (!) 152/70  Pulse: 67  Weight: 83.8 kg  Height: 5\' 8"  (1.727 m)    GEN- The patient is well appearing elderly male, alert and oriented x 3 today.   HEENT-head normocephalic, atraumatic, sclera clear, conjunctiva pink, hearing intact, trachea midline. Lungs- Clear to ausculation bilaterally, normal work of breathing Heart- irregular rate and rhythm, no murmurs, rubs or gallops  GI- soft, NT, ND, + BS Extremities- no clubbing, cyanosis, or edema MS- no significant deformity or atrophy Skin- no rash or lesion Psych- euthymic mood, full affect Neuro- strength and sensation are intact   Wt Readings from Last 3 Encounters:  01/10/20 83.8 kg  01/03/20 84.9 kg  12/27/19 84.2 kg    EKG today demonstrates afib HR 67, LBBB, QRS 160, QTc 507  Echo 11/15/19 demonstrated  1. Day 1 post TAVR, no paravalvular leak. Normal transaortic gradients  peak/mean 17/10 mmHg. LVEF improved to 55-60%.  2. Left ventricular ejection fraction, by estimation, is 55 to 60%. The  left ventricle has normal function. There is moderate concentric left  ventricular hypertrophy.  3. Trivial mitral valve regurgitation.  4. The aortic valve has been  repaired/replaced. There is a 26 mm Ultra,  stented (TAVR) valve present in the aortic position. Procedure Date:  11/14/2019. Echo findings are consistent with normal structure and  function of the aortic valve prosthesis.  Aortic valve mean gradient measures 10.0 mmHg.  5. There is normal pulmonary artery systolic pressure. The estimated  right ventricular systolic pressure is 29.6 mmHg.   Epic records are reviewed at length today  CHA2DS2-VASc Score = 6  The patient's score is based upon: CHF History: 0 HTN History: 1 Age : 2 Diabetes History: 0 Stroke History: 2 Vascular Disease History: 1 Gender: 0      ASSESSMENT AND PLAN: 1. Persistent Atrial Fibrillation (ICD10:  I48.19) The patient's CHA2DS2-VASc score is 6, indicating a 9.7% annual risk of stroke. S/p DCCV on 12/20/19 with early return of afib. Patient would like to try one more attempt at SR. Given his symptom improvement over the last two weeks, he may be a good candidate for rate control if he has return of his afib again. Will continue amiodarone 200 mg BID and repeat DCCV. Then decrease back to 200 mg daily. Continue Eliquis 5 mg BID  2. Secondary Hypercoagulable State (ICD10:  D68.69) The patient is at significant risk for stroke/thromboembolism based upon his CHA2DS2-VASc Score of 6.  Continue Apixaban (Eliquis).   3.CAD S/p CABG 1994 No anginal symptoms.  4. Aortic stenosis S/p TAVR 11/14/19.  5. HTN Stable, no changes today.  6. CHB/symptomatic bradycardia S/p PPM,  followed by Dr Graciela Husbands and the device clinic.   Follow up in the AF clinic one week post DCCV.   Jorja Loa PA-C [facility] [address] [place], [place] [address] [phone] 01/10/2020 2:39 PM

## 2020-01-10 NOTE — Progress Notes (Signed)
Primary Care Physician: Chilton Greathouse, MD Primary Cardiologist: Dr Tresa Endo Structural Heart: Dr Clifton James Primary Electrophysiologist: Dr Graciela Husbands Referring Physician: Dr Keane Scrape is a 84 y.o. male with a history of CAD status four-vessel CABG in 1994, severe aortic stenosis status post TAVR on 11/14/2019, HFpEF, CVA, CKD stage III, prior GI bleed, HTN, HLD, GERD, and persistent atrial fibrillation who presents for follow up in the [facility]. Patient is on Eliquis for a CHADS2VASC score of 6. Patient was found to have afib post TAVR and a Zio patch was placed. He subsequently suffered 2 syncopal events.  He was brought via EMS after the last to [facility]. Review of his Zio patch showedmultiple pauses lasting from 8 to 11 seconds in duration and he underwent PPM implantation with Dr Graciela Husbands on 11/20/19. Patient is s/p DCCV 12/20/19. He was in SR for about 2 days. He felt minimally improved during those two days. He knew he was back in afib on his smart watch.   On follow up today, patient reports he has done well since his last visit. He actually reports that he feels his strength and stamina are improving. He denies any bleeding issues on anticoagulation or missed doses. His smart watch has shown persistent afib.  Today, he denies symptoms of palpitations, chest pain, orthopnea, PND, lower extremity edema, dizziness, presyncope, syncope, snoring, daytime somnolence, bleeding, or neurologic sequela. The patient is tolerating medications without difficulties and is otherwise without complaint today.    Atrial Fibrillation Risk Factors:  he does not have symptoms or diagnosis of sleep apnea.   he has a BMI of Body mass index is 28.1 kg/m.[name] Filed Weights   01/10/20 1407  Weight: 83.8 kg    Family History  Problem Relation Age of Onset  . Cancer Mother   . Heart disease Father        died age 50  . Stroke Maternal Grandmother   . Cancer Maternal  Grandfather   . Allergic rhinitis Neg Hx   . Angioedema Neg Hx   . Asthma Neg Hx   . Atopy Neg Hx   . Eczema Neg Hx   . Immunodeficiency Neg Hx   . Urticaria Neg Hx      Atrial Fibrillation Management history:  Previous antiarrhythmic drugs: amiodarone Previous cardioversions: 12/20/19 Previous ablations: none CHADS2VASC score: 6 Anticoagulation history: Eliquis   Past Medical History:  Diagnosis Date  . Arthritis    "just a touch in my hands" (11/24/2016)  . Asthma   . Atherosclerosis of renal artery (HCC)    RENAL DOPPLER, 12/10/2011 - Left renal artery demonstrated narrowing with elevated velocities consistent with a 1-59% diameter reduction  . CKD (chronic kidney disease) stage 3, GFR 30-59 ml/min    "stable now since they backed off the water pills" (11/24/2016)  . Coronary artery disease    a. 1994 s/p CABG x 4 (LIMA-LAD, VG->D2, VG->OM, VG->RCA); b. 02/2004 PCI SVG-D2 (3.5x16 Taxus DES). VG->RCA 100. Sev apical LAD dzs distal to LIMA insertion; c. 11/2016 Cath: LAD 95p/167m, D2 100ost, LCX 100ost, 70p/m, RCA 100p/m, RPDA fills via L->R collats. VG->RCA 100, VG->D2 20 ost, patent prox stent, 18m, LIMA->LAD ok, VG->OM3 20p. EF 55%-->Med Rx.  . GERD (gastroesophageal reflux disease)   . High cholesterol   . History of lower GI bleeding    a. 09/2014 GIB due to diverticulosis/diverticulitis.  . Iron deficiency anemia   . Labile Hypertension   . Moderate aortic  stenosis    a. 10/2011 Echo:  EF >55%, mild-mod TR, mild-mod AS, mod Ca2+ of AoV leaflets; b. 07/2016 Echo: EF 60-65%, no rwma, Gr1 DD, mod AS [(S) mean grad , peak grad . Valve area (VTI): 1.33cm^2, (Vmax) 1.44cm^2. Mild MR]; c. 02/2018 Echo: EF 55-60%, no rwma, GR1 DD, Mod AS [Peak Vel (S): 319cm/s, Mean grad (S) , peak grad (S) 32mmHg].  [name] PAF (paroxysmal atrial fibrillation) (HCC)    a. CHA2DS2VASc = 4-->Eliquis.  . S/P TAVR (transcatheter aortic valve replacement) 11/14/2019   s/p TAVR with a 26 mm  Edwards S3U via the TF approach by Drs Clifton James & Bartle  . Sleep apnea    Past Surgical History:  Procedure Laterality Date  . CARDIAC CATHETERIZATION  02/27/2004   Coronary intervention and medical management  . CARDIAC CATHETERIZATION  11/24/2016  . CARDIOVERSION N/A 12/20/2019   Procedure: CARDIOVERSION;  Surgeon: Chilton Si, MD;  Location: [facility] ENDOSCOPY;  Service: Cardiovascular;  Laterality: N/A;  . CATARACT EXTRACTION W/ INTRAOCULAR LENS IMPLANT Left   . CORONARY ANGIOPLASTY  1994 X 2   "before bypass surgery"  . CORONARY ANGIOPLASTY WITH STENT PLACEMENT  03/04/2004   SVG supplying the diagonal vessel stented with a 3.5x56mm Taxus stent post dilated to 4.0 mm  . CORONARY ARTERY BYPASS GRAFT  1994   "CABG X4"  . INGUINAL HERNIA REPAIR Right   . PACEMAKER IMPLANT N/A 11/20/2019   Procedure: PACEMAKER IMPLANT;  Surgeon: Duke Salvia, MD;  Location: [facility] INVASIVE CV LAB;  Service: Cardiovascular;  Laterality: N/A;  . RIGHT HEART CATH AND CORONARY/GRAFT ANGIOGRAPHY N/A 11/24/2016   Procedure: Right Heart Cath and Coronary/Graft Angiography;  Surgeon: Lennette Bihari, MD;  Location: [facility] INVASIVE CV LAB;  Service: Cardiovascular;  Laterality: N/A;  . RIGHT/LEFT HEART CATH AND CORONARY/GRAFT ANGIOGRAPHY N/A 10/18/2019   Procedure: RIGHT/LEFT HEART CATH AND CORONARY/GRAFT ANGIOGRAPHY;  Surgeon: Lennette Bihari, MD;  Location: [facility] INVASIVE CV LAB;  Service: Cardiovascular;  Laterality: N/A;  . TEE WITHOUT CARDIOVERSION N/A 11/14/2019   Procedure: TRANSESOPHAGEAL ECHOCARDIOGRAM (TEE);  Surgeon: Kathleene Hazel, MD;  Location: [facility] INVASIVE CV LAB;  Service: Open Heart Surgery;  Laterality: N/A;  . TONSILLECTOMY AND ADENOIDECTOMY    . TRANSCATHETER AORTIC VALVE REPLACEMENT, TRANSFEMORAL N/A 11/14/2019   Procedure: TRANSCATHETER AORTIC VALVE REPLACEMENT, TRANSFEMORAL;  Surgeon: Kathleene Hazel, MD;  Location: [facility] INVASIVE CV LAB;  Service: Open Heart Surgery;  Laterality: N/A;     Current Outpatient Medications  Medication Sig Dispense Refill  . albuterol (VENTOLIN HFA) 108 (90 Base) MCG/ACT inhaler INHALE TWO PUFFS EVERY 4-6 HOURS AS NEEDED FOR COUGH OR WHEEZE 8.5 g 1  . amiodarone (PACERONE) 200 MG tablet Take 1 tablet (200 mg total) by mouth 2 (two) times daily. 90 tablet 3  . amLODipine (NORVASC) 2.5 MG tablet TAKE 1 TABLET BY MOUTH TWICE A DAY 180 tablet 0  . apixaban (ELIQUIS) 5 MG TABS tablet Take 1 tablet (5 mg total) by mouth 2 (two) times daily. 60 tablet 6  . atorvastatin (LIPITOR) 20 MG tablet Take 1 tablet (20 mg total) by mouth at bedtime. 30 tablet 0  . Azelastine-Fluticasone (DYMISTA) 137-50 MCG/ACT SUSP Place 1 spray into the nose daily as needed (Sinus).    [name] docusate sodium (COLACE) 100 MG capsule Take 100 mg by mouth daily.     [name] ezetimibe (ZETIA) 10 MG tablet Take 1 tablet (10 mg total) by mouth daily. 90 tablet 2  . fenofibrate (TRICOR) 145 MG tablet Take  0.5 tablets (72.5 mg total) by mouth daily. 90 tablet 1  . fluticasone (FLONASE) 50 MCG/ACT nasal spray Place 2 sprays into both nostrils daily as needed for allergies or rhinitis.     . fluticasone (FLOVENT HFA) 110 MCG/ACT inhaler Inhale 1 puff into the lungs 3 (three) times daily as needed (for seasonal flares). sinus    . ipratropium (ATROVENT) 0.06 % nasal spray Can use two sprays in each nostril every six hours as needed to dry up nose. 15 mL 5  . isosorbide mononitrate (IMDUR) 60 MG 24 hr tablet TAKE 1 AND 1/2 TABS ( ) BY MOUTH EVERY MORNING AND 1/2 TAB EVERY EVENING 180 tablet 3  . levothyroxine (SYNTHROID) 25 MCG tablet Take 25 mcg by mouth daily before breakfast.     . loratadine (CLARITIN) 10 MG tablet Take 10 mg by mouth every evening.     . Multiple Vitamins-Minerals (CENTRUM SILVER 50+MEN) TABS Take 1 tablet by mouth at bedtime.    . nitroGLYCERIN (NITROLINGUAL) 0.4 MG/SPRAY spray PLACE 1 SPRAY UNDER THE TONGUE EVERY 5 (FIVE) MINUTES X 3 DOSES AS NEEDED FOR CHEST PAIN. 12 g 1  .  Omega-3 Fatty Acids (FISH OIL) 1000 MG CAPS Take 1,000 mg by mouth 2 (two) times daily.     . pantoprazole (PROTONIX) 40 MG tablet TAKE 1 TABLET BY MOUTH EVERY DAY 90 tablet 1  . ranolazine (RANEXA) 500 MG 12 hr tablet TAKE 1 TABLET BY MOUTH TWICE A DAY 180 tablet 3  . vitamin C (ASCORBIC ACID) 500 MG tablet Take 500 mg by mouth at bedtime.     . vitamin E 400 UNIT capsule Take 400 Units by mouth daily.    [name] zinc gluconate 50 MG tablet Take 50 mg by mouth 2 (two) times a week.     No current facility-administered medications for this encounter.    Allergies  Allergen Reactions  . Altace [Ramipril] Swelling and Other (See Comments)    Mouth swelling  . Mucinex [Guaifenesin Er] Hives, Swelling and Other (See Comments)    Mouth swelling  . Contrast Media [Iodinated Diagnostic Agents] Other (See Comments)    Made eyes change each time    Social History   Socioeconomic History  . Marital status: Married    Spouse name: Not on file  . Number of children: 2  . Years of education: Not on file  . Highest education level: Not on file  Occupational History  . Not on file  Tobacco Use  . Smoking status: Never Smoker  . Smokeless tobacco: Never Used  Vaping Use  . Vaping Use: Never used  Substance and Sexual Activity  . Alcohol use: No    Alcohol/week: 0.0 standard drinks  . Drug use: No  . Sexual activity: Not Currently  Other Topics Concern  . Not on file  Social History Narrative   Lives at home in [place] with wife.  Retired from [employer].     Social Determinants of Health   Financial Resource Strain:   . Difficulty of Paying Living Expenses: Not on file  Food Insecurity:   . Worried About Programme researcher, broadcasting/film/video in the Last Year: Not on file  . Ran Out of Food in the Last Year: Not on file  Transportation Needs:   . Lack of Transportation (Medical): Not on file  . Lack of Transportation (Non-Medical): Not on file  Physical Activity:   . Days of Exercise  per Week: Not on file  .  Minutes of Exercise per Session: Not on file  Stress:   . Feeling of Stress : Not on file  Social Connections:   . Frequency of Communication with Friends and Family: Not on file  . Frequency of Social Gatherings with Friends and Family: Not on file  . Attends Religious Services: Not on file  . Active Member of Clubs or Organizations: Not on file  . Attends BankerClub or Organization Meetings: Not on file  . Marital Status: Not on file  Intimate Partner Violence:   . Fear of Current or Ex-Partner: Not on file  . Emotionally Abused: Not on file  . Physically Abused: Not on file  . Sexually Abused: Not on file     ROS- All systems are reviewed and negative except as per the HPI above.  Physical Exam: Vitals:   01/10/20 1407  BP: (!) 152/70  Pulse: 67  Weight: 83.8 kg  Height: 5\' 8"  (1.727 m)    GEN- The patient is well appearing elderly male, alert and oriented x 3 today.   HEENT-head normocephalic, atraumatic, sclera clear, conjunctiva pink, hearing intact, trachea midline. Lungs- Clear to ausculation bilaterally, normal work of breathing Heart- irregular rate and rhythm, no murmurs, rubs or gallops  GI- soft, NT, ND, + BS Extremities- no clubbing, cyanosis, or edema MS- no significant deformity or atrophy Skin- no rash or lesion Psych- euthymic mood, full affect Neuro- strength and sensation are intact   Wt Readings from Last 3 Encounters:  01/10/20 83.8 kg  01/03/20 84.9 kg  12/27/19 84.2 kg    EKG today demonstrates afib HR 67, LBBB, QRS 160, QTc 507  Echo 11/15/19 demonstrated  1. Day 1 post TAVR, no paravalvular leak. Normal transaortic gradients  peak/mean 17/10 mmHg. LVEF improved to 55-60%.  2. Left ventricular ejection fraction, by estimation, is 55 to 60%. The  left ventricle has normal function. There is moderate concentric left  ventricular hypertrophy.  3. Trivial mitral valve regurgitation.  4. The aortic valve has been  repaired/replaced. There is a 26 mm Ultra,  stented (TAVR) valve present in the aortic position. Procedure Date:  11/14/2019. Echo findings are consistent with normal structure and  function of the aortic valve prosthesis.  Aortic valve mean gradient measures 10.0 mmHg.  5. There is normal pulmonary artery systolic pressure. The estimated  right ventricular systolic pressure is 29.6 mmHg.   Epic records are reviewed at length today  CHA2DS2-VASc Score = 6  The patient's score is based upon: CHF History: 0 HTN History: 1 Age : 2 Diabetes History: 0 Stroke History: 2 Vascular Disease History: 1 Gender: 0      ASSESSMENT AND PLAN: 1. Persistent Atrial Fibrillation (ICD10:  I48.19) The patient's CHA2DS2-VASc score is 6, indicating a 9.7% annual risk of stroke. S/p DCCV on 12/20/19 with early return of afib. Patient would like to try one more attempt at SR. Given his symptom improvement over the last two weeks, he may be a good candidate for rate control if he has return of his afib again. Will continue amiodarone 200 mg BID and repeat DCCV. Then decrease back to 200 mg daily. Continue Eliquis 5 mg BID  2. Secondary Hypercoagulable State (ICD10:  D68.69) The patient is at significant risk for stroke/thromboembolism based upon his CHA2DS2-VASc Score of 6.  Continue Apixaban (Eliquis).   3.CAD S/p CABG 1994 No anginal symptoms.  4. Aortic stenosis S/p TAVR 11/14/19.  5. HTN Stable, no changes today.  6. CHB/symptomatic bradycardia S/p PPM,  followed by Dr Graciela Husbands and the device clinic.   Follow up in the AF clinic one week post DCCV.   Jorja Loa PA-C Afib Clinic [facility] [address] [place], [place] [address] [phone] 01/10/2020 2:39 PM

## 2020-01-11 ENCOUNTER — Other Ambulatory Visit (HOSPITAL_COMMUNITY): Payer: Self-pay | Admitting: *Deleted

## 2020-01-11 ENCOUNTER — Encounter (HOSPITAL_COMMUNITY): Payer: Self-pay | Admitting: *Deleted

## 2020-01-16 ENCOUNTER — Other Ambulatory Visit: Payer: Medicare PPO

## 2020-01-17 ENCOUNTER — Other Ambulatory Visit (HOSPITAL_COMMUNITY): Payer: Self-pay | Admitting: *Deleted

## 2020-01-17 MED ORDER — AMIODARONE HCL 200 MG PO TABS
200.0000 mg | ORAL_TABLET | Freq: Every day | ORAL | 3 refills | Status: DC
Start: 2020-01-17 — End: 2020-03-06

## 2020-01-19 DIAGNOSIS — Z125 Encounter for screening for malignant neoplasm of prostate: Secondary | ICD-10-CM | POA: Diagnosis not present

## 2020-01-19 DIAGNOSIS — E785 Hyperlipidemia, unspecified: Secondary | ICD-10-CM | POA: Diagnosis not present

## 2020-01-19 DIAGNOSIS — E032 Hypothyroidism due to medicaments and other exogenous substances: Secondary | ICD-10-CM | POA: Diagnosis not present

## 2020-01-25 ENCOUNTER — Ambulatory Visit (HOSPITAL_COMMUNITY): Payer: Medicare PPO | Admitting: Physician Assistant

## 2020-01-25 DIAGNOSIS — D509 Iron deficiency anemia, unspecified: Secondary | ICD-10-CM | POA: Diagnosis not present

## 2020-01-25 DIAGNOSIS — D692 Other nonthrombocytopenic purpura: Secondary | ICD-10-CM | POA: Diagnosis not present

## 2020-01-25 DIAGNOSIS — E7849 Other hyperlipidemia: Secondary | ICD-10-CM | POA: Diagnosis not present

## 2020-01-25 DIAGNOSIS — N1831 Chronic kidney disease, stage 3a: Secondary | ICD-10-CM | POA: Diagnosis not present

## 2020-01-25 DIAGNOSIS — I5032 Chronic diastolic (congestive) heart failure: Secondary | ICD-10-CM | POA: Diagnosis not present

## 2020-01-25 DIAGNOSIS — Z23 Encounter for immunization: Secondary | ICD-10-CM | POA: Diagnosis not present

## 2020-01-25 DIAGNOSIS — Z0000 Encounter for general adult medical examination without abnormal findings: Secondary | ICD-10-CM | POA: Diagnosis not present

## 2020-01-25 DIAGNOSIS — I130 Hypertensive heart and chronic kidney disease with heart failure and stage 1 through stage 4 chronic kidney disease, or unspecified chronic kidney disease: Secondary | ICD-10-CM | POA: Diagnosis not present

## 2020-01-25 DIAGNOSIS — I25810 Atherosclerosis of coronary artery bypass graft(s) without angina pectoris: Secondary | ICD-10-CM | POA: Diagnosis not present

## 2020-01-25 DIAGNOSIS — E032 Hypothyroidism due to medicaments and other exogenous substances: Secondary | ICD-10-CM | POA: Diagnosis not present

## 2020-01-25 DIAGNOSIS — R82998 Other abnormal findings in urine: Secondary | ICD-10-CM | POA: Diagnosis not present

## 2020-01-31 ENCOUNTER — Other Ambulatory Visit: Payer: Self-pay

## 2020-01-31 ENCOUNTER — Other Ambulatory Visit: Payer: Medicare PPO

## 2020-01-31 ENCOUNTER — Other Ambulatory Visit: Payer: Self-pay | Admitting: Physician Assistant

## 2020-01-31 ENCOUNTER — Ambulatory Visit (HOSPITAL_COMMUNITY)
Admission: RE | Admit: 2020-01-31 | Discharge: 2020-01-31 | Disposition: A | Discharge status: Home / Self Care | Payer: Medicare PPO | Source: Ambulatory Visit | Attending: Physician Assistant | Admitting: Physician Assistant

## 2020-01-31 DIAGNOSIS — I700 Atherosclerosis of aorta: Secondary | ICD-10-CM | POA: Diagnosis not present

## 2020-01-31 DIAGNOSIS — N2889 Other specified disorders of kidney and ureter: Secondary | ICD-10-CM

## 2020-01-31 DIAGNOSIS — K5730 Diverticulosis of large intestine without perforation or abscess without bleeding: Secondary | ICD-10-CM | POA: Diagnosis not present

## 2020-01-31 DIAGNOSIS — N281 Cyst of kidney, acquired: Secondary | ICD-10-CM | POA: Diagnosis not present

## 2020-01-31 IMAGING — MR MR ABDOMEN W/O CM
10 series · 47 of 48 positions shown · non-contrast
Comparison: CT angiography from [DATE]

CLINICAL DATA: Renal mass, history of normal renal function. Study
performed without contrast at the request of the patient

EXAM:
MRI ABDOMEN WITHOUT CONTRAST
TECHNIQUE: Multiplanar multisequence MR imaging was performed without the
administration of intravenous contrast.

[Series 4: cor haste · coronal · 6.0mm · 1.31mm/px · 2 of 34 slices shown]
[im 1/34]
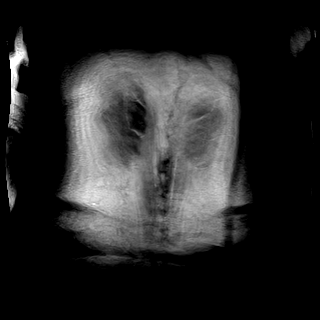
[im 34/34]
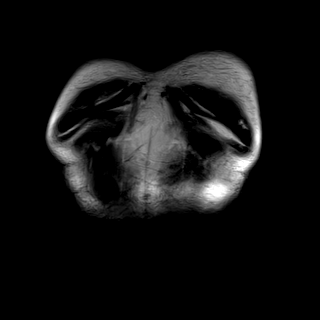

[Series 5: ax haste · axial · 6.0mm · 1.25mm/px · z∈[-143,+137]mm · 3 of 40 slices shown]
[im 1/40]
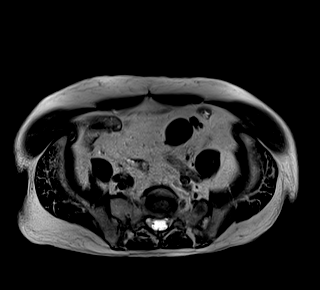
[im 20/40]
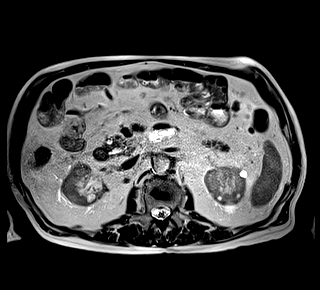
[im 40/40]
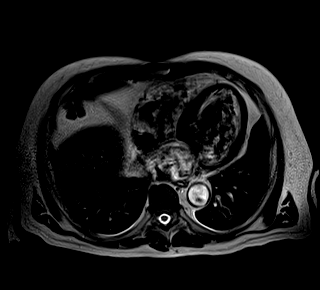

[Series 7: T2 fat-sat · axial · 6.0mm · 1.25mm/px · z∈[-143,+137]mm · 3 of 40 slices shown]
[im 1/40]
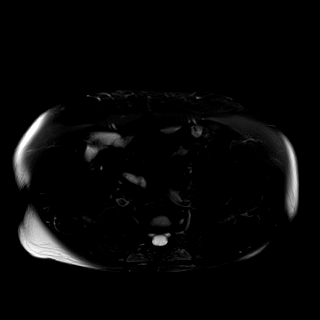
[im 20/40]
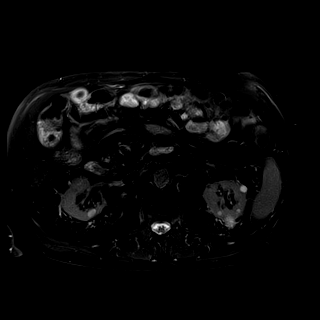
[im 40/40]
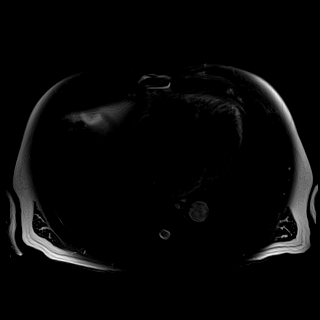

[Series 9: t1_vibe_opp-in_tra_p4_bh · axial · 3.0mm · 1.31mm/px · z∈[-123,+138]mm · 7 of 88 slices shown (1 of 2)]
[im 1/88]
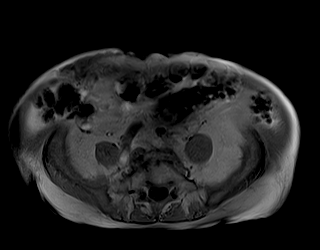
[im 15/88]
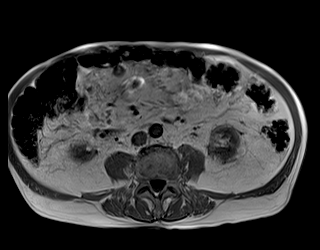
[im 30/88]
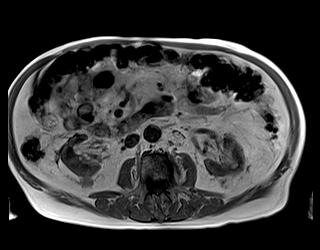
[im 44/88]
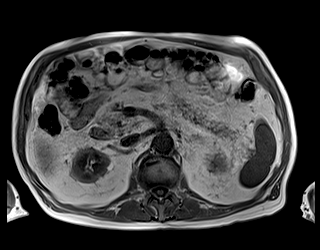
[im 59/88]
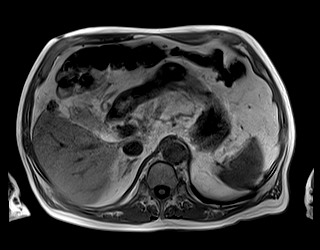
[im 73/88]
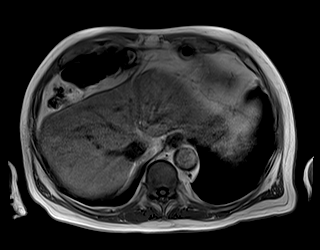
[im 88/88]
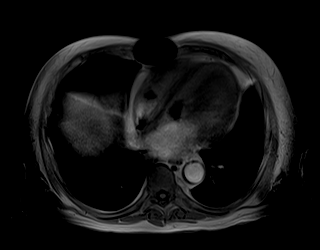

[Series 9: t1_vibe_opp-in_tra_p4_bh · axial · 3.0mm · 1.31mm/px · z∈[-123,+138]mm · 7 of 88 slices shown (2 of 2)]
[im 1/88]
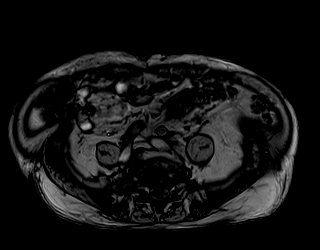
[im 15/88]
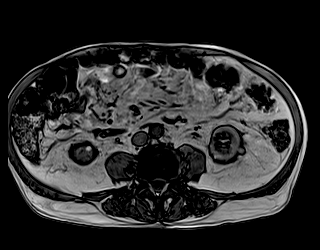
[im 30/88]
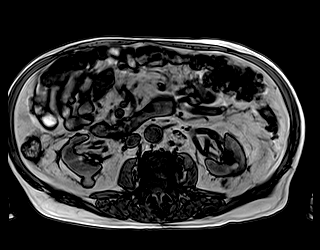
[im 44/88]
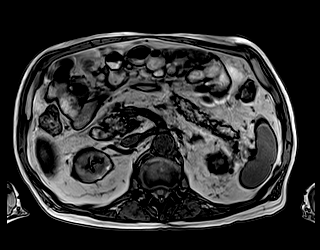
[im 59/88]
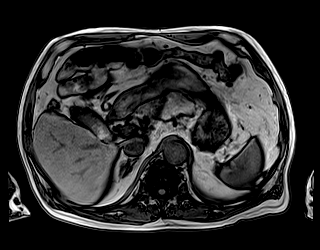
[im 73/88]
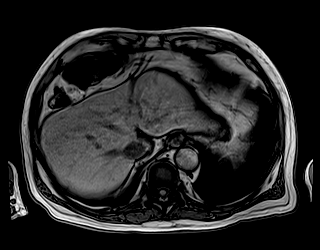
[im 88/88]
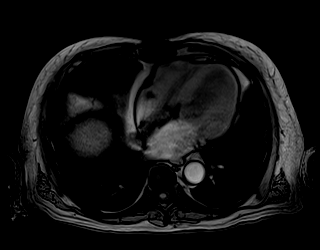

[Series 10: DWI · axial · 6.0mm · 1.49mm/px · z∈[-158,+152]mm · 9 of 132 slices shown (1 of 2)]
[im 1/132]
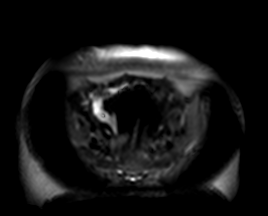
[im 15/132]
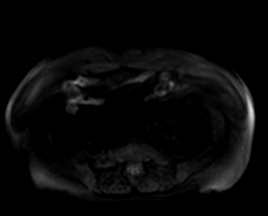
[im 30/132]
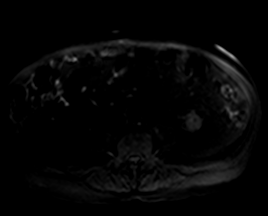
[im 44/132]
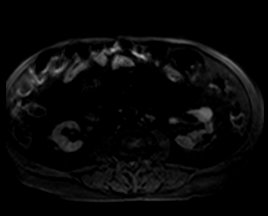
[im 59/132]
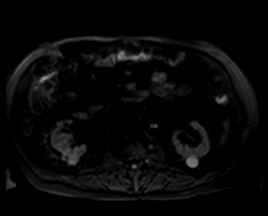
[im 73/132]
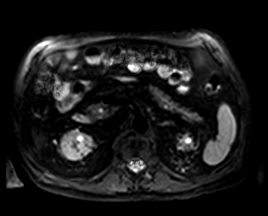
[im 88/132]
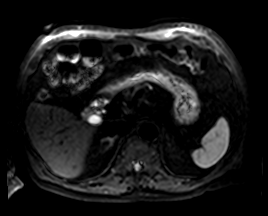
[im 117/132]
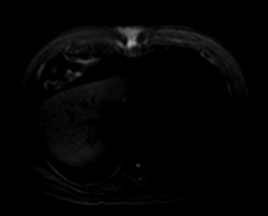
[im 132/132]
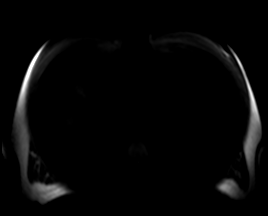

[Series 11: DWI · axial · 6.0mm · 1.49mm/px · z∈[-158,+152]mm · 3 of 44 slices shown (2 of 2)]
[im 1/44]
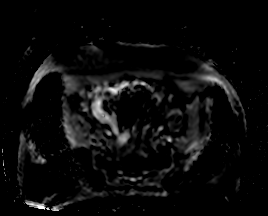
[im 22/44]
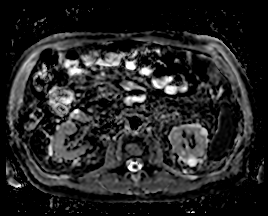
[im 44/44]
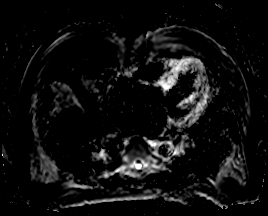

[Series 12: bSSFP · axial · 6.0mm · 0.74mm/px · z∈[-143,+137]mm · 3 of 40 slices shown (1 of 2)]
[im 1/40]
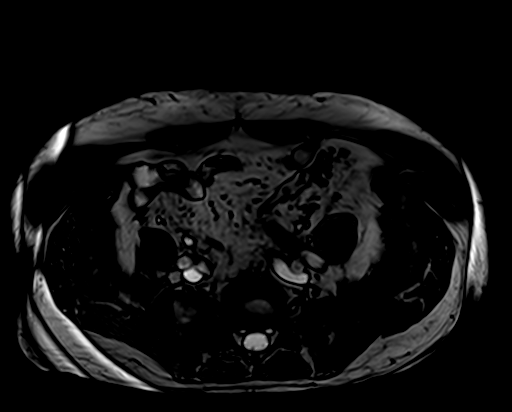
[im 20/40]
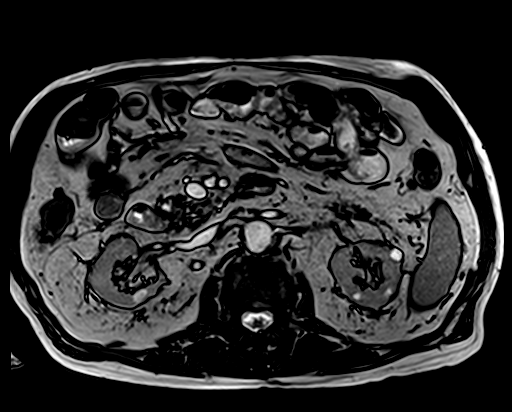
[im 40/40]
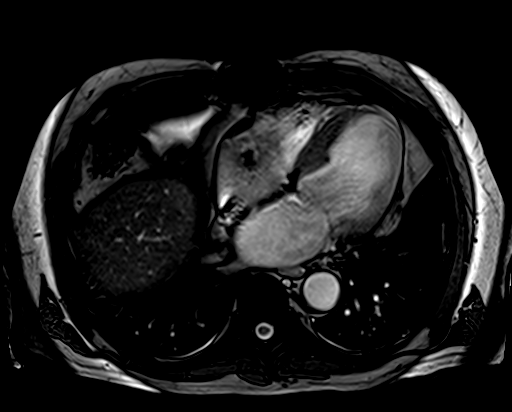

[Series 13: t1_vibe_fs_tra_p4_bh_pre · axial · 3.0mm · 1.25mm/px · z∈[-123,+138]mm · 7 of 88 slices shown]
[im 1/88]
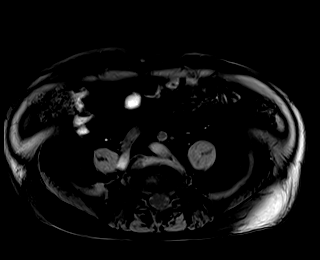
[im 15/88]
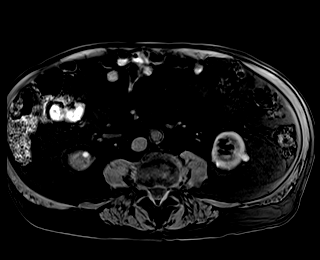
[im 30/88]
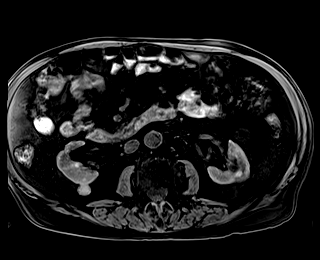
[im 44/88]
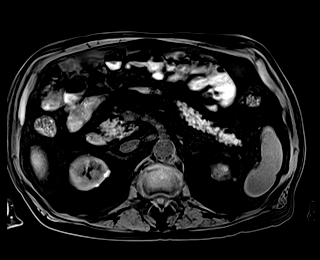
[im 59/88]
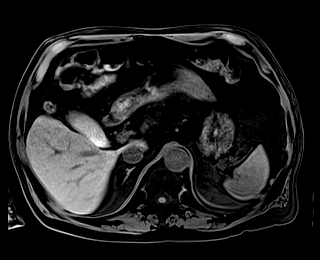
[im 73/88]
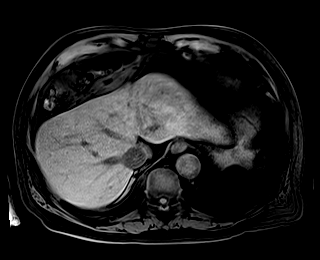
[im 88/88]
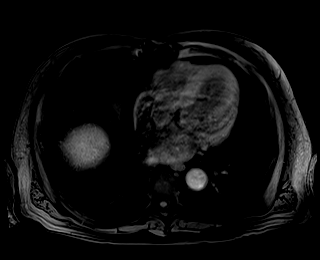

[Series 14: bSSFP · axial · 6.0mm · 0.78mm/px · z∈[-143,+137]mm · 3 of 40 slices shown (2 of 2)]
[im 1/40]
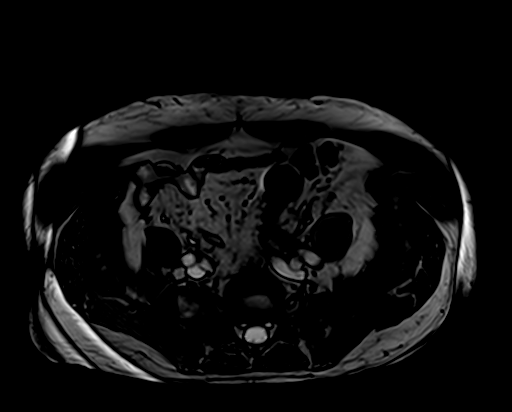
[im 20/40]
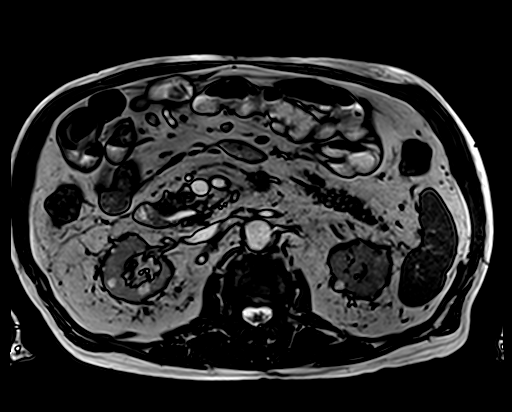
[im 40/40]
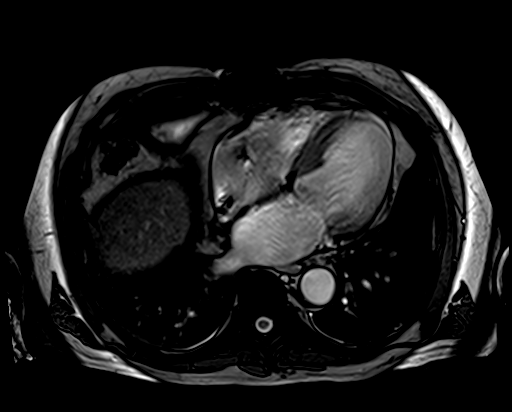

[47 of 48 positions shown; findings below may reference images not displayed]

FINDINGS: Lower chest: Limited assessment of the lung bases on MRI. No
consolidation or pleural effusion. Post median sternotomy.

Hepatobiliary: No biliary duct distension. No fat or iron deposition
in the liver. No focal lesion on noncontrast imaging.

No pericholecystic stranding.

Pancreas: Pancreas without ductal dilation or sign of inflammation.
No suspicious focal lesion.

Spleen:  Spleen normal in size and contour.

Adrenals/Urinary Tract:  Adrenal glands are normal.

There are cysts of varying complexity in the bilateral kidneys. The
area of concern in the interpolar LEFT kidney shows markedly low T2
signal and uniformly hyperintense T1 signal. Another exophytic
lesion in the interpolar LEFT kidney anteriorly measuring
approximately 2.1 x 2.2 cm also shows uniform T1 hyperintensity and
less T2 hypointensity.

A small exophytic lesion arising from the posterior LEFT kidney
shows intermediate to low T2 signal and is nearly isointense to
slightly hyperintense on T1 extending from the posterior RIGHT
kidney, this measures approximately 1.6 by 1.5 cm. Only slightly
hyperintense to the adjacent renal parenchyma on T1. No
hydronephrosis.

Stomach/Bowel: Gastrointestinal tract without acute process. Limited
assessment on MRI not performed for bowel evaluation. Marked colonic
diverticulosis.

Vascular/Lymphatic: Atherosclerotic plaque in the nonaneurysmal
abdominal aorta. There is no gastrohepatic or hepatoduodenal
ligament lymphadenopathy. No retroperitoneal or mesenteric
lymphadenopathy.

Other:  No ascites.

Musculoskeletal: Similar appearance of compression fractures in the
lower thoracic and upper lumbar spine
IMPRESSION: 1. Hemorrhagic and simple appearing cysts bilaterally. The areas of
concern in the interpolar LEFT kidney represent hemorrhagic cysts,
Bosniak category II based on new Bosniak criteria from [KL].
2. Indeterminate lesion in the interpolar RIGHT kidney is also
likely a mildly proteinaceous or hemorrhagic cyst but in the absence
of contrast cannot be definitively characterized. Focused ultrasound
with attention to this area during a renal ultrasound follow-up at 6
months is suggested for further evaluation.

## 2020-01-31 NOTE — Progress Notes (Signed)
Patient here for MRI of abdomen W WO contrast. After mentioning about the contrast, patients declined wanting to receive contrast due to an issue with his eyes from a previous study. MRI tech Tim spoke with a radiologist who says to proceed without giving patient contrast today. [facility] aexpress sent and awaiting orders at this time.

## 2020-01-31 NOTE — Progress Notes (Signed)
MRI completed. Pacemaker re-programmed to original settings. Patient ambulatory at discharge

## 2020-01-31 NOTE — Progress Notes (Signed)
Orders received from [facility] PA. MRI surescan ON-OVO

## 2020-01-31 NOTE — Progress Notes (Signed)
Informed of MRI for today.   Device system confirmed to be MRI conditional, with implant date > 6 weeks ago and no evidence of abandoned or epicardial leads in review of most recent CXR Interrogation from today reviewed, pt is currently AF-VS at ~60 bpm Change device settings for MRI to ODO   Tachy-therapies to off if applicable  Program device back to pre-MRI settings after completion of exam.  Graciella Freer, PA-C  01/31/2020 1:27 PM

## 2020-02-02 ENCOUNTER — Other Ambulatory Visit: Payer: Self-pay

## 2020-02-02 ENCOUNTER — Other Ambulatory Visit
Admission: RE | Admit: 2020-02-02 | Discharge: 2020-02-02 | Disposition: A | Discharge status: Home / Self Care | Payer: Medicare PPO | Source: Ambulatory Visit | Attending: Cardiovascular Disease | Admitting: Cardiovascular Disease

## 2020-02-02 DIAGNOSIS — Z20822 Contact with and (suspected) exposure to covid-19: Secondary | ICD-10-CM | POA: Diagnosis not present

## 2020-02-02 DIAGNOSIS — Z01812 Encounter for preprocedural laboratory examination: Secondary | ICD-10-CM | POA: Diagnosis not present

## 2020-02-03 LAB — SARS CORONAVIRUS 2 (TAT 6-24 HRS): SARS Coronavirus 2: NEGATIVE

## 2020-02-05 ENCOUNTER — Ambulatory Visit (HOSPITAL_COMMUNITY): Payer: Medicare PPO | Admitting: Anesthesiology

## 2020-02-05 ENCOUNTER — Encounter (HOSPITAL_COMMUNITY): Payer: Self-pay | Admitting: Cardiovascular Disease

## 2020-02-05 ENCOUNTER — Encounter (HOSPITAL_COMMUNITY)
Admission: RE | Disposition: A | Discharge status: Home / Self Care | Payer: Medicare PPO | Source: Home / Self Care | Attending: Cardiovascular Disease

## 2020-02-05 ENCOUNTER — Other Ambulatory Visit: Payer: Self-pay

## 2020-02-05 ENCOUNTER — Ambulatory Visit (HOSPITAL_COMMUNITY)
Admission: RE | Admit: 2020-02-05 | Discharge: 2020-02-05 | Disposition: A | Discharge status: Home / Self Care | Payer: Medicare PPO | Source: Ambulatory Visit | Attending: Physician Assistant | Admitting: Physician Assistant

## 2020-02-05 ENCOUNTER — Ambulatory Visit (HOSPITAL_COMMUNITY)
Admission: RE | Admit: 2020-02-05 | Discharge: 2020-02-05 | Disposition: A | Discharge status: Home / Self Care | Payer: Medicare PPO | Attending: Cardiovascular Disease | Admitting: Cardiovascular Disease

## 2020-02-05 ENCOUNTER — Ambulatory Visit (HOSPITAL_COMMUNITY): Payer: Medicare PPO

## 2020-02-05 DIAGNOSIS — I442 Atrioventricular block, complete: Secondary | ICD-10-CM | POA: Diagnosis not present

## 2020-02-05 DIAGNOSIS — Z79899 Other long term (current) drug therapy: Secondary | ICD-10-CM | POA: Insufficient documentation

## 2020-02-05 DIAGNOSIS — I5033 Acute on chronic diastolic (congestive) heart failure: Secondary | ICD-10-CM | POA: Diagnosis not present

## 2020-02-05 DIAGNOSIS — Z8673 Personal history of transient ischemic attack (TIA), and cerebral infarction without residual deficits: Secondary | ICD-10-CM | POA: Insufficient documentation

## 2020-02-05 DIAGNOSIS — D6869 Other thrombophilia: Secondary | ICD-10-CM | POA: Diagnosis not present

## 2020-02-05 DIAGNOSIS — Z7901 Long term (current) use of anticoagulants: Secondary | ICD-10-CM | POA: Diagnosis not present

## 2020-02-05 DIAGNOSIS — I4891 Unspecified atrial fibrillation: Secondary | ICD-10-CM

## 2020-02-05 DIAGNOSIS — Z951 Presence of aortocoronary bypass graft: Secondary | ICD-10-CM | POA: Insufficient documentation

## 2020-02-05 DIAGNOSIS — I350 Nonrheumatic aortic (valve) stenosis: Secondary | ICD-10-CM | POA: Diagnosis not present

## 2020-02-05 DIAGNOSIS — I4819 Other persistent atrial fibrillation: Secondary | ICD-10-CM | POA: Insufficient documentation

## 2020-02-05 DIAGNOSIS — I130 Hypertensive heart and chronic kidney disease with heart failure and stage 1 through stage 4 chronic kidney disease, or unspecified chronic kidney disease: Secondary | ICD-10-CM | POA: Diagnosis not present

## 2020-02-05 DIAGNOSIS — I701 Atherosclerosis of renal artery: Secondary | ICD-10-CM | POA: Diagnosis not present

## 2020-02-05 DIAGNOSIS — E7800 Pure hypercholesterolemia, unspecified: Secondary | ICD-10-CM | POA: Insufficient documentation

## 2020-02-05 DIAGNOSIS — I509 Heart failure, unspecified: Secondary | ICD-10-CM | POA: Diagnosis not present

## 2020-02-05 DIAGNOSIS — G4730 Sleep apnea, unspecified: Secondary | ICD-10-CM | POA: Diagnosis not present

## 2020-02-05 DIAGNOSIS — K219 Gastro-esophageal reflux disease without esophagitis: Secondary | ICD-10-CM | POA: Diagnosis not present

## 2020-02-05 DIAGNOSIS — Z952 Presence of prosthetic heart valve: Secondary | ICD-10-CM | POA: Diagnosis not present

## 2020-02-05 DIAGNOSIS — E785 Hyperlipidemia, unspecified: Secondary | ICD-10-CM | POA: Diagnosis not present

## 2020-02-05 DIAGNOSIS — Z79890 Hormone replacement therapy (postmenopausal): Secondary | ICD-10-CM | POA: Insufficient documentation

## 2020-02-05 DIAGNOSIS — Z950 Presence of cardiac pacemaker: Secondary | ICD-10-CM | POA: Diagnosis not present

## 2020-02-05 DIAGNOSIS — N183 Chronic kidney disease, stage 3 unspecified: Secondary | ICD-10-CM | POA: Diagnosis not present

## 2020-02-05 DIAGNOSIS — I480 Paroxysmal atrial fibrillation: Secondary | ICD-10-CM | POA: Diagnosis not present

## 2020-02-05 DIAGNOSIS — I2510 Atherosclerotic heart disease of native coronary artery without angina pectoris: Secondary | ICD-10-CM | POA: Insufficient documentation

## 2020-02-05 DIAGNOSIS — I483 Typical atrial flutter: Secondary | ICD-10-CM | POA: Diagnosis not present

## 2020-02-05 HISTORY — PX: CARDIOVERSION: SHX1299

## 2020-02-05 LAB — CBC
HCT: 40.7 % (ref 39.0–52.0)
Hemoglobin: 12.5 g/dL — ABNORMAL LOW (ref 13.0–17.0)
MCH: 29.8 pg (ref 26.0–34.0)
MCHC: 30.7 g/dL (ref 30.0–36.0)
MCV: 97.1 fL (ref 80.0–100.0)
Platelets: 156 10*3/uL (ref 150–400)
RBC: 4.19 MIL/uL — ABNORMAL LOW (ref 4.22–5.81)
RDW: 14.3 % (ref 11.5–15.5)
WBC: 6.4 10*3/uL (ref 4.0–10.5)
nRBC: 0 % (ref 0.0–0.2)

## 2020-02-05 LAB — BASIC METABOLIC PANEL
Anion gap: 6 (ref 5–15)
BUN: 18 mg/dL (ref 8–23)
CO2: 22 mmol/L (ref 22–32)
Calcium: 9 mg/dL (ref 8.9–10.3)
Chloride: 112 mmol/L — ABNORMAL HIGH (ref 98–111)
Creatinine, Ser: 1.43 mg/dL — ABNORMAL HIGH (ref 0.61–1.24)
GFR calc Af Amer: 53 mL/min — ABNORMAL LOW (ref >60)
GFR calc non Af Amer: 46 mL/min — ABNORMAL LOW (ref >60)
Glucose, Bld: 94 mg/dL (ref 70–99)
Potassium: 3.8 mmol/L (ref 3.5–5.1)
Sodium: 140 mmol/L (ref 135–145)

## 2020-02-05 SURGERY — CARDIOVERSION
Anesthesia: General

## 2020-02-05 MED ORDER — SODIUM CHLORIDE 0.9 % IV SOLN: INTRAVENOUS | Status: DC | PRN

## 2020-02-05 MED ORDER — LIDOCAINE 2% (20 MG/ML) 5 ML SYRINGE
INTRAMUSCULAR | Status: DC | PRN
  Administered 2020-02-05: 40 mg via INTRAVENOUS

## 2020-02-05 MED ORDER — SODIUM CHLORIDE 0.9 % IV SOLN: Freq: Once | INTRAVENOUS | Status: AC

## 2020-02-05 MED ORDER — PROPOFOL 10 MG/ML IV BOLUS
INTRAVENOUS | Status: DC | PRN
  Administered 2020-02-05: 50 mg via INTRAVENOUS

## 2020-02-05 NOTE — CV Procedure (Signed)
DCC: On Rx anticoagulation no missed doses Anesthesia: Propofol Medtronic Rep present for Azure device check  DCC x 1 150 J biphasic sync Converted from afib rate 95 to NSR rate 70's Had 5 minutes overdrive AV pacing per [employer]tr[employer]   No immediate neurologic sequelae Normal PPM function   Charlton Haws MD [facility]

## 2020-02-05 NOTE — Interval H&P Note (Signed)
History and Physical Interval Note:  02/05/2020 10:43 AM  Jorge Cherry  has presented today for surgery, with the diagnosis of AFIB.  The various methods of treatment have been discussed with the patient and family. After consideration of risks, benefits and other options for treatment, the patient has consented to  Procedure(s): CARDIOVERSION (N/A) as a surgical intervention.  The patient's history has been reviewed, patient examined, no change in status, stable for surgery.  I have reviewed the patient's chart and labs.  Questions were answered to the patient's satisfaction.     Charlton Haws

## 2020-02-05 NOTE — Anesthesia Procedure Notes (Signed)
Procedure Name: [place] Performed by: Inda Coke, CRNA Pre-anesthesia Checklist: Patient identified, Emergency Drugs available, Suction available, Timeout performed and Patient being monitored Patient Re-evaluated:Patient Re-evaluated prior to induction Oxygen Delivery Method: Ambu bag Preoxygenation: Pre-oxygenation with 100% oxygen Induction Type: IV induction Dental Injury: Teeth and Oropharynx as per pre-operative assessment

## 2020-02-05 NOTE — Discharge Instructions (Signed)
Electrical Cardioversion Electrical cardioversion is the delivery of a jolt of electricity to restore a normal rhythm to the heart. A rhythm that is too fast or is not regular keeps the heart from pumping well. In this procedure, sticky patches or metal paddles are placed on the chest to deliver electricity to the heart from a device. This procedure may be done in an emergency if:  There is low or no blood pressure as a result of the heart rhythm.  Normal rhythm must be restored as fast as possible to protect the brain and heart from further damage.  It may save a life. This may also be a scheduled procedure for irregular or fast heart rhythms that are not immediately life-threatening. Tell a health care provider about:  Any allergies you have.  All medicines you are taking, including vitamins, herbs, eye drops, creams, and over-the-counter medicines.  Any problems you or family members have had with anesthetic medicines.  Any blood disorders you have.  Any surgeries you have had.  Any medical conditions you have.  Whether you are pregnant or may be pregnant. What are the risks? Generally, this is a safe procedure. However, problems may occur, including:  Allergic reactions to medicines.  A blood clot that breaks free and travels to other parts of your body.  The possible return of an abnormal heart rhythm within hours or days after the procedure.  Your heart stopping (cardiac arrest). This is rare. What happens before the procedure? Medicines  Your health care provider may have you start taking: ? Blood-thinning medicines (anticoagulants) so your blood does not clot as easily. ? Medicines to help stabilize your heart rate and rhythm.  Ask your health care provider about: ? Changing or stopping your regular medicines. This is especially important if you are taking diabetes medicines or blood thinners. ? Taking medicines such as aspirin and ibuprofen. These medicines can  thin your blood. Do not take these medicines unless your health care provider tells you to take them. ? Taking over-the-counter medicines, vitamins, herbs, and supplements. General instructions  Follow instructions from your health care provider about eating or drinking restrictions.  Plan to have someone take you home from the hospital or clinic.  If you will be going home right after the procedure, plan to have someone with you for 24 hours.  Ask your health care provider what steps will be taken to help prevent infection. These may include washing your skin with a germ-killing soap. What happens during the procedure?   An IV will be inserted into one of your veins.  Sticky patches (electrodes) or metal paddles may be placed on your chest.  You will be given a medicine to help you relax (sedative).  An electrical shock will be delivered. The procedure may vary among health care providers and hospitals. What can I expect after the procedure?  Your blood pressure, heart rate, breathing rate, and blood oxygen level will be monitored until you leave the hospital or clinic.  Your heart rhythm will be watched to make sure it does not change.  You may have some redness on the skin where the shocks were given. Follow these instructions at home:  Do not drive for 24 hours if you were given a sedative during your procedure.  Take over-the-counter and prescription medicines only as told by your health care provider.  Ask your health care provider how to check your pulse. Check it often.  Rest for 48 hours after the procedure or   as told by your health care provider.  Avoid or limit your caffeine use as told by your health care provider.  Keep all follow-up visits as told by your health care provider. This is important. Contact a health care provider if:  You feel like your heart is beating too quickly or your pulse is not regular.  You have a serious muscle cramp that does not go  away. Get help right away if:  You have discomfort in your chest.  You are dizzy or you feel faint.  You have trouble breathing or you are short of breath.  Your speech is slurred.  You have trouble moving an arm or leg on one side of your body.  Your fingers or toes turn cold or blue. Summary  Electrical cardioversion is the delivery of a jolt of electricity to restore a normal rhythm to the heart.  This procedure may be done right away in an emergency or may be a scheduled procedure if the condition is not an emergency.  Generally, this is a safe procedure.  After the procedure, check your pulse often as told by your health care provider. This information is not intended to replace advice given to you by your health care provider. Make sure you discuss any questions you have with your health care provider. Document Revised: 11/28/2018 Document Reviewed: 11/28/2018 Elsevier Patient Education  2020 Elsevier Inc.  

## 2020-02-05 NOTE — Anesthesia Postprocedure Evaluation (Signed)
Anesthesia Post Note  Patient: Jorge Cherry  Procedure(s) Performed: CARDIOVERSION (N/A )     Patient location during evaluation: PACU Anesthesia Type: General Level of consciousness: awake and alert Pain management: pain level controlled Vital Signs Assessment: post-procedure vital signs reviewed and stable Respiratory status: spontaneous breathing, nonlabored ventilation, respiratory function stable and patient connected to nasal cannula oxygen Cardiovascular status: blood pressure returned to baseline and stable Postop Assessment: no apparent nausea or vomiting Anesthetic complications: no   No complications documented.  Last Vitals:  Vitals:   02/05/20 1150 02/05/20 1155  BP: (!) 145/75 (!) 161/78  Pulse: 65 63  Resp: 18 15  Temp:    SpO2: 94% 96%    Last Pain:  Vitals:   02/05/20 1130  TempSrc: Oral  PainSc: 0-No pain                 Yashira Offenberger DAVID

## 2020-02-05 NOTE — Anesthesia Preprocedure Evaluation (Addendum)
Anesthesia Evaluation  Patient identified by MRN, date of birth, ID band Patient awake    Reviewed: Allergy & Precautions, NPO status , Patient's Chart, lab work & pertinent test results  Airway Mallampati: I  TM Distance: >3 FB Neck ROM: Full    Dental   Pulmonary    Pulmonary exam normal        Cardiovascular hypertension, + CAD and + Peripheral Vascular Disease  Normal cardiovascular exam+ dysrhythmias Atrial Fibrillation   1. Left ventricular ejection fraction, by estimation, is 60 to 65%. The  left ventricle has normal function. The left ventricle has no regional  wall motion abnormalities. There is moderate left ventricular hypertrophy.  Left ventricular diastolic  parameters are indeterminate.  2. Right ventricular systolic function is normal. The right ventricular  size is normal. There is normal pulmonary artery systolic pressure.  3. Left atrial size was moderately dilated.  4. The mitral valve is normal in structure. Mild mitral valve  regurgitation. No evidence of mitral stenosis.  5. Tricuspid valve regurgitation is moderate.  6. Mild perivalvular leak. The aortic valve has been repaired/replaced.  Aortic valve regurgitation is mild. No aortic stenosis is present. There  is a 26 mm Edwards Sapien prosthetic (TAVR) valve present in the aortic  position. Procedure Date:  11/14/2019. Aortic valve mean gradient measures 13.0 mmHg. Aortic valve  Vmax measures 2.35 m/s.  7. Aortic dilatation noted. There is mild dilatation of the ascending  aorta measuring 38 mm.  8. The inferior vena cava is normal in size with greater than 50%  respiratory variability, suggesting right atrial pressure of 3 mmHg.    Neuro/Psych    GI/Hepatic Neg liver ROS, GERD  ,  Endo/Other    Renal/GU Renal diseaseLab Results      Component                Value               Date                      CREATININE               1.43 (H)             02/05/2020           Lab Results      Component                Value               Date                      K                        3.8                 02/05/2020                Musculoskeletal   Abdominal   Peds  Hematology  (+) Blood dyscrasia, anemia , Lab Results      Component                Value               Date                      WBC  6.4                 02/05/2020                HGB                      12.5 (L)            02/05/2020                HCT                      40.7                02/05/2020                MCV                      97.1                02/05/2020                PLT                      156                 02/05/2020              Anesthesia Other Findings   Reproductive/Obstetrics                            Anesthesia Physical Anesthesia Plan  ASA: III  Anesthesia Plan: General   Post-op Pain Management:    Induction: Intravenous  PONV Risk Score and Plan: 2 and Treatment may vary due to age or medical condition  Airway Management Planned: Mask  Additional Equipment: None  Intra-op Plan:   Post-operative Plan:   Informed Consent: I have reviewed the patients History and Physical, chart, labs and discussed the procedure including the risks, benefits and alternatives for the proposed anesthesia with the patient or authorized representative who has indicated his/her understanding and acceptance.     Dental advisory given  Plan Discussed with: CRNA and Surgeon  Anesthesia Plan Comments:        Anesthesia Quick Evaluation

## 2020-02-05 NOTE — Transfer of Care (Signed)
Immediate Anesthesia Transfer of Care Note  Patient: Jorge Cherry  Procedure(s) Performed: CARDIOVERSION (N/A )  Patient Location: Endoscopy Unit  Anesthesia Type:General  Level of Consciousness: awake and drowsy  Airway & Oxygen Therapy: Patient Spontanous Breathing  Post-op Assessment: Report given to RN and Post -op Vital signs reviewed and stable  Post vital signs: Reviewed and stable  Last Vitals:  Vitals Value Taken Time  BP 159/84 02/05/20 1130  Temp    Pulse 81 02/05/20 1130  Resp 17 02/05/20 1130  SpO2 100 % 02/05/20 1130    Last Pain:  Vitals:   02/05/20 1003  TempSrc: Temporal  PainSc: 0-No pain         Complications: No complications documented.

## 2020-02-06 ENCOUNTER — Encounter (HOSPITAL_COMMUNITY): Payer: Self-pay | Admitting: Cardiovascular Disease

## 2020-02-07 DIAGNOSIS — Z961 Presence of intraocular lens: Secondary | ICD-10-CM | POA: Diagnosis not present

## 2020-02-08 ENCOUNTER — Ambulatory Visit (HOSPITAL_COMMUNITY): Payer: Medicare PPO | Admitting: Physician Assistant

## 2020-02-13 ENCOUNTER — Other Ambulatory Visit: Payer: Self-pay

## 2020-02-13 ENCOUNTER — Ambulatory Visit (HOSPITAL_COMMUNITY)
Admission: RE | Admit: 2020-02-13 | Discharge: 2020-02-13 | Disposition: A | Discharge status: Home / Self Care | Payer: Medicare PPO | Source: Ambulatory Visit | Attending: Physician Assistant | Admitting: Physician Assistant

## 2020-02-13 ENCOUNTER — Encounter (HOSPITAL_COMMUNITY): Payer: Self-pay | Admitting: Physician Assistant

## 2020-02-13 VITALS — BP 138/72 | HR 69 | Ht 68.0 in | Wt 185.0 lb

## 2020-02-13 DIAGNOSIS — Z950 Presence of cardiac pacemaker: Secondary | ICD-10-CM | POA: Insufficient documentation

## 2020-02-13 DIAGNOSIS — I350 Nonrheumatic aortic (valve) stenosis: Secondary | ICD-10-CM | POA: Diagnosis not present

## 2020-02-13 DIAGNOSIS — R001 Bradycardia, unspecified: Secondary | ICD-10-CM | POA: Insufficient documentation

## 2020-02-13 DIAGNOSIS — I701 Atherosclerosis of renal artery: Secondary | ICD-10-CM | POA: Diagnosis not present

## 2020-02-13 DIAGNOSIS — Z79890 Hormone replacement therapy (postmenopausal): Secondary | ICD-10-CM | POA: Insufficient documentation

## 2020-02-13 DIAGNOSIS — Z7901 Long term (current) use of anticoagulants: Secondary | ICD-10-CM | POA: Insufficient documentation

## 2020-02-13 DIAGNOSIS — I25810 Atherosclerosis of coronary artery bypass graft(s) without angina pectoris: Secondary | ICD-10-CM | POA: Insufficient documentation

## 2020-02-13 DIAGNOSIS — Z8673 Personal history of transient ischemic attack (TIA), and cerebral infarction without residual deficits: Secondary | ICD-10-CM | POA: Diagnosis not present

## 2020-02-13 DIAGNOSIS — D6869 Other thrombophilia: Secondary | ICD-10-CM | POA: Insufficient documentation

## 2020-02-13 DIAGNOSIS — K219 Gastro-esophageal reflux disease without esophagitis: Secondary | ICD-10-CM | POA: Insufficient documentation

## 2020-02-13 DIAGNOSIS — I5032 Chronic diastolic (congestive) heart failure: Secondary | ICD-10-CM | POA: Diagnosis not present

## 2020-02-13 DIAGNOSIS — Z951 Presence of aortocoronary bypass graft: Secondary | ICD-10-CM | POA: Diagnosis not present

## 2020-02-13 DIAGNOSIS — Z79899 Other long term (current) drug therapy: Secondary | ICD-10-CM | POA: Diagnosis not present

## 2020-02-13 DIAGNOSIS — I130 Hypertensive heart and chronic kidney disease with heart failure and stage 1 through stage 4 chronic kidney disease, or unspecified chronic kidney disease: Secondary | ICD-10-CM | POA: Insufficient documentation

## 2020-02-13 DIAGNOSIS — Z952 Presence of prosthetic heart valve: Secondary | ICD-10-CM | POA: Insufficient documentation

## 2020-02-13 DIAGNOSIS — E785 Hyperlipidemia, unspecified: Secondary | ICD-10-CM | POA: Diagnosis not present

## 2020-02-13 DIAGNOSIS — N183 Chronic kidney disease, stage 3 unspecified: Secondary | ICD-10-CM | POA: Insufficient documentation

## 2020-02-13 DIAGNOSIS — I4819 Other persistent atrial fibrillation: Secondary | ICD-10-CM | POA: Insufficient documentation

## 2020-02-13 NOTE — Progress Notes (Signed)
Primary Care Physician: Chilton Greathouse, MD Primary Cardiologist: Dr Tresa Endo Structural Heart: Dr Clifton James Primary Electrophysiologist: Dr Graciela Husbands Referring Physician: Dr Keane Scrape is a 84 y.o. male with a history of CAD status four-vessel CABG in 1994, severe aortic stenosis status post TAVR on 11/14/2019, HFpEF, CVA, CKD stage III, prior GI bleed, HTN, HLD, GERD, and persistent atrial fibrillation who presents for follow up in the [facility]. Patient is on Eliquis for a CHADS2VASC score of 6. Patient was found to have afib post TAVR and a Zio patch was placed. He subsequently suffered 2 syncopal events.  He was brought via EMS after the last to [facility]. Review of his Zio patch showedmultiple pauses lasting from 8 to 11 seconds in duration and he underwent PPM implantation with Dr Graciela Husbands on 11/20/19. Patient is s/p DCCV 12/20/19. He was in SR for about 2 days. He felt minimally improved during those two days. He knew he was back in afib on his smart watch.   On follow up today, patient is s/p repeat DCCV on 02/05/20. Patient reports he was in SR for only about 24 hours. He did not feel any different in SR vs afib. Overall, he has continued to improve since his surgery.   Today, he denies symptoms of palpitations, chest pain, orthopnea, PND, lower extremity edema, dizziness, presyncope, syncope, snoring, daytime somnolence, bleeding, or neurologic sequela. The patient is tolerating medications without difficulties and is otherwise without complaint today.    Atrial Fibrillation Risk Factors:  he does not have symptoms or diagnosis of sleep apnea.   he has a BMI of Body mass index is 28.13 kg/m.[name] Filed Weights   02/13/20 1040  Weight: 83.9 kg    Family History  Problem Relation Age of Onset   Cancer Mother    Heart disease Father        died age 43   Stroke Maternal Grandmother    Cancer Maternal Grandfather    Allergic rhinitis Neg Hx     Angioedema Neg Hx    Asthma Neg Hx    Atopy Neg Hx    Eczema Neg Hx    Immunodeficiency Neg Hx    Urticaria Neg Hx      Atrial Fibrillation Management history:  Previous antiarrhythmic drugs: amiodarone Previous cardioversions: 12/20/19, 02/05/20 Previous ablations: none CHADS2VASC score: 6 Anticoagulation history: Eliquis   Past Medical History:  Diagnosis Date   Arthritis    "just a touch in my hands" (11/24/2016)   Asthma    Atherosclerosis of renal artery (HCC)    RENAL DOPPLER, 12/10/2011 - Left renal artery demonstrated narrowing with elevated velocities consistent with a 1-59% diameter reduction   CKD (chronic kidney disease) stage 3, GFR 30-59 ml/min (HCC)    "stable now since they backed off the water pills" (11/24/2016)   Coronary artery disease    a. 1994 s/p CABG x 4 (LIMA-LAD, VG->D2, VG->OM, VG->RCA); b. 02/2004 PCI SVG-D2 (3.5x16 Taxus DES). VG->RCA 100. Sev apical LAD dzs distal to LIMA insertion; c. 11/2016 Cath: LAD 95p/180m, D2 100ost, LCX 100ost, 70p/m, RCA 100p/m, RPDA fills via L->R collats. VG->RCA 100, VG->D2 20 ost, patent prox stent, 40m, LIMA->LAD ok, VG->OM3 20p. EF 55%-->Med Rx.   GERD (gastroesophageal reflux disease)    High cholesterol    History of lower GI bleeding    a. 09/2014 GIB due to diverticulosis/diverticulitis.   Iron deficiency anemia    Labile Hypertension    Moderate  aortic stenosis    a. 10/2011 Echo:  EF >55%, mild-mod TR, mild-mod AS, mod Ca2+ of AoV leaflets; b. 07/2016 Echo: EF 60-65%, no rwma, Gr1 DD, mod AS [(S) mean grad , peak grad . Valve area (VTI): 1.33cm^2, (Vmax) 1.44cm^2. Mild MR]; c. 02/2018 Echo: EF 55-60%, no rwma, GR1 DD, Mod AS [Peak Vel (S): 319cm/s, Mean grad (S) , peak grad (S) 33mmHg].   PAF (paroxysmal atrial fibrillation) (HCC)    a. CHA2DS2VASc = 4-->Eliquis.   S/P TAVR (transcatheter aortic valve replacement) 11/14/2019   s/p TAVR with a 26 mm Edwards S3U via the TF approach by  Drs Clifton James & Bartle   Sleep apnea    Past Surgical History:  Procedure Laterality Date   CARDIAC CATHETERIZATION  02/27/2004   Coronary intervention and medical management   CARDIAC CATHETERIZATION  11/24/2016   CARDIOVERSION N/A 12/20/2019   Procedure: CARDIOVERSION;  Surgeon: Chilton Si, MD;  Location: [facility] ENDOSCOPY;  Service: Cardiovascular;  Laterality: N/A;   CARDIOVERSION N/A 02/05/2020   Procedure: CARDIOVERSION;  Surgeon: Wendall Stade, MD;  Location: [facility] ENDOSCOPY;  Service: Cardiovascular;  Laterality: N/A;   CATARACT EXTRACTION W/ INTRAOCULAR LENS IMPLANT Left    CORONARY ANGIOPLASTY  1994 X 2   "before bypass surgery"   CORONARY ANGIOPLASTY WITH STENT PLACEMENT  03/04/2004   SVG supplying the diagonal vessel stented with a 3.5x23mm Taxus stent post dilated to 4.0 mm   CORONARY ARTERY BYPASS GRAFT  1994   "CABG X4"   INGUINAL HERNIA REPAIR Right    PACEMAKER IMPLANT N/A 11/20/2019   Procedure: PACEMAKER IMPLANT;  Surgeon: Duke Salvia, MD;  Location: [facility] INVASIVE CV LAB;  Service: Cardiovascular;  Laterality: N/A;   RIGHT HEART CATH AND CORONARY/GRAFT ANGIOGRAPHY N/A 11/24/2016   Procedure: Right Heart Cath and Coronary/Graft Angiography;  Surgeon: Lennette Bihari, MD;  Location: [facility] INVASIVE CV LAB;  Service: Cardiovascular;  Laterality: N/A;   RIGHT/LEFT HEART CATH AND CORONARY/GRAFT ANGIOGRAPHY N/A 10/18/2019   Procedure: RIGHT/LEFT HEART CATH AND CORONARY/GRAFT ANGIOGRAPHY;  Surgeon: Lennette Bihari, MD;  Location: [facility] INVASIVE CV LAB;  Service: Cardiovascular;  Laterality: N/A;   TEE WITHOUT CARDIOVERSION N/A 11/14/2019   Procedure: TRANSESOPHAGEAL ECHOCARDIOGRAM (TEE);  Surgeon: Kathleene Hazel, MD;  Location: [facility] INVASIVE CV LAB;  Service: Open Heart Surgery;  Laterality: N/A;   TONSILLECTOMY AND ADENOIDECTOMY     TRANSCATHETER AORTIC VALVE REPLACEMENT, TRANSFEMORAL N/A 11/14/2019   Procedure: TRANSCATHETER AORTIC VALVE REPLACEMENT, TRANSFEMORAL;   Surgeon: Kathleene Hazel, MD;  Location: [facility] INVASIVE CV LAB;  Service: Open Heart Surgery;  Laterality: N/A;    Current Outpatient Medications  Medication Sig Dispense Refill   albuterol (VENTOLIN HFA) 108 (90 Base) MCG/ACT inhaler INHALE TWO PUFFS EVERY 4-6 HOURS AS NEEDED FOR COUGH OR WHEEZE 8.5 g 1   amiodarone (PACERONE) 200 MG tablet Take 1 tablet (200 mg total) by mouth daily. 90 tablet 3   amLODipine (NORVASC) 2.5 MG tablet TAKE 1 TABLET BY MOUTH TWICE A DAY 180 tablet 0   apixaban (ELIQUIS) 5 MG TABS tablet Take 1 tablet (5 mg total) by mouth 2 (two) times daily. 60 tablet 6   atorvastatin (LIPITOR) 20 MG tablet Take 1 tablet (20 mg total) by mouth at bedtime. 30 tablet 0   ezetimibe (ZETIA) 10 MG tablet Take 1 tablet (10 mg total) by mouth daily. 90 tablet 2   fenofibrate (TRICOR) 145 MG tablet Take 0.5 tablets (72.5 mg total) by mouth daily. 90 tablet 1  fluticasone (FLONASE) 50 MCG/ACT nasal spray Place 2 sprays into both nostrils daily as needed for allergies or rhinitis.      ipratropium (ATROVENT) 0.06 % nasal spray Can use two sprays in each nostril every six hours as needed to dry up nose. 15 mL 5   isosorbide mononitrate (IMDUR) 60 MG 24 hr tablet TAKE 1 AND 1/2 TABS (90MG ) BY MOUTH EVERY MORNING AND 1/2 TAB EVERY EVENING 180 tablet 3   levothyroxine (SYNTHROID) 25 MCG tablet Take 25 mcg by mouth daily before breakfast.      loratadine (CLARITIN) 10 MG tablet Take 10 mg by mouth every evening.      Multiple Vitamins-Minerals (CENTRUM SILVER 50+MEN) TABS Take 1 tablet by mouth at bedtime.     nitroGLYCERIN (NITROLINGUAL) 0.4 MG/SPRAY spray PLACE 1 SPRAY UNDER THE TONGUE EVERY 5 (FIVE) MINUTES X 3 DOSES AS NEEDED FOR CHEST PAIN. 12 g 1   Omega-3 Fatty Acids (FISH OIL) 1000 MG CAPS Take 1,000 mg by mouth 2 (two) times daily.      pantoprazole (PROTONIX) 40 MG tablet TAKE 1 TABLET BY MOUTH EVERY DAY 90 tablet 1   ranolazine (RANEXA) 500 MG 12 hr tablet TAKE  1 TABLET BY MOUTH TWICE A DAY 180 tablet 3   vitamin C (ASCORBIC ACID) 500 MG tablet Take 500 mg by mouth at bedtime.      vitamin E 400 UNIT capsule Take 400 Units by mouth daily.     zinc gluconate 50 MG tablet Take 50 mg by mouth 2 (two) times a week.     No current facility-administered medications for this encounter.    Allergies  Allergen Reactions   Altace [Ramipril] Swelling and Other (See Comments)    Mouth swelling   Mucinex [Guaifenesin Er] Hives, Swelling and Other (See Comments)    Mouth swelling   Contrast Media [Iodinated Diagnostic Agents] Other (See Comments)    Made eyes change each time    Social History   Socioeconomic History   Marital status: Married    Spouse name: Not on file   Number of children: 2   Years of education: Not on file   Highest education level: Not on file  Occupational History   Not on file  Tobacco Use   Smoking status: Never Smoker   Smokeless tobacco: Never Used  Vaping Use   Vaping Use: Never used  Substance and Sexual Activity   Alcohol use: No    Alcohol/week: 0.0 standard drinks   Drug use: No   Sexual activity: Not Currently  Other Topics Concern   Not on file  Social History Narrative   [place].  Retired from [employer].     Social Determinants of Health   Financial Resource Strain:    Difficulty of Paying Living Expenses: Not on file  Food Insecurity:    Worried About Programme researcher, broadcasting/film/videounning Out of Food in the Last Year: Not on file   [employer] Out of Food in the Last Year: Not on file  Transportation Needs:    Lack of Transportation (Medical): Not on file   Lack of Transportation (Non-Medical): Not on file  Physical Activity:    Days of Exercise per Week: Not on file   Minutes of Exercise per Session: Not on file  Stress:    Feeling of Stress : Not on file  Social Connections:    Frequency of Communication with Friends and Family: Not on file   Frequency  of  Social Gatherings with Friends and Family: Not on file   Attends Religious Services: Not on file   Active Member of Clubs or Organizations: Not on file   Attends Banker Meetings: Not on file   Marital Status: Not on file  Intimate Partner Violence:    Fear of Current or Ex-Partner: Not on file   Emotionally Abused: Not on file   Physically Abused: Not on file   Sexually Abused: Not on file     ROS- All systems are reviewed and negative except as per the HPI above.  Physical Exam: Vitals:   02/13/20 1040  BP: 138/72  Pulse: 69  Weight: 83.9 kg  Height: 5\' 8"  (1.727 m)    GEN- The patient is well appearing elderly male, alert and oriented x 3 today.   HEENT-head normocephalic, atraumatic, sclera clear, conjunctiva pink, hearing intact, trachea midline. Lungs- Clear to ausculation bilaterally, normal work of breathing Heart- irregular rate and rhythm, no murmurs, rubs or gallops  GI- soft, NT, ND, + BS Extremities- no clubbing, cyanosis, or edema MS- no significant deformity or atrophy Skin- no rash or lesion Psych- euthymic mood, full affect Neuro- strength and sensation are intact   Wt Readings from Last 3 Encounters:  02/13/20 83.9 kg  02/05/20 83 kg  01/10/20 83.8 kg    EKG today demonstrates afib HR 69, QRS 158, QTc 480  Echo 11/15/19 demonstrated  1. Day 1 post TAVR, no paravalvular leak. Normal transaortic gradients  peak/mean 17/10 mmHg. LVEF improved to 55-60%.  2. Left ventricular ejection fraction, by estimation, is 55 to 60%. The  left ventricle has normal function. There is moderate concentric left  ventricular hypertrophy.  3. Trivial mitral valve regurgitation.  4. The aortic valve has been repaired/replaced. There is a 26 mm Ultra,  stented (TAVR) valve present in the aortic position. Procedure Date:  11/14/2019. Echo findings are consistent with normal structure and  function of the aortic valve prosthesis.  Aortic valve mean  gradient measures 10.0 mmHg.  5. There is normal pulmonary artery systolic pressure. The estimated  right ventricular systolic pressure is 29.6 mmHg.   Epic records are reviewed at length today  CHA2DS2-VASc Score = 6  The patient's score is based upon: CHF History: 0 HTN History: 1 Age : 2 Diabetes History: 0 Stroke History: 2 Vascular Disease History: 1 Gender: 0      ASSESSMENT AND PLAN: 1. Persistent Atrial Fibrillation (ICD10:  I48.19) The patient's CHA2DS2-VASc score is 6, indicating a 9.7% annual risk of stroke. S/p DCCV 02/05/20 and is back in afib again. He has failed to stay in SR despite extended loading on amiodarone.  We had a long discussion about rate vs rhythm control. Given his paucity of symptoms, good rate control, and advanced age, he may be a good candidate for rate control. Patient would like to discuss with Dr 02/07/20 before making any changes.  Will continue amiodarone 200 mg daily for now. Continue Eliquis 5 mg BID  2. Secondary Hypercoagulable State (ICD10:  D68.69) The patient is at significant risk for stroke/thromboembolism based upon his CHA2DS2-VASc Score of 6.  Continue Apixaban (Eliquis).   3.CAD S/p CABG 1994 No anginal symptoms.  4. Aortic stenosis S/p TAVR 11/14/19.  5. HTN Stable, no changes today.  6. CHB/symptomatic bradycardia S/p PPM, followed by Dr 01/15/20 and the device clinic.   Follow up with Dr Graciela Husbands and Dr Graciela Husbands as scheduled.    Tresa Endo PA-C Afib Clinic  [facility] [address] [place], [place] [address] [phone] 02/13/2020 11:12 AM

## 2020-02-20 ENCOUNTER — Other Ambulatory Visit: Payer: Self-pay | Admitting: Cardiovascular Disease

## 2020-02-20 ENCOUNTER — Other Ambulatory Visit (HOSPITAL_COMMUNITY): Payer: Medicare PPO

## 2020-02-21 LAB — CUP PACEART REMOTE DEVICE CHECK
Battery Remaining Longevity: 159 mo
Battery Voltage: 3.2 V
Brady Statistic AP VP Percent: 0.27 %
Brady Statistic AP VS Percent: 14.47 %
Brady Statistic AS VP Percent: 0.08 %
Brady Statistic AS VS Percent: 85.05 %
Brady Statistic RA Percent Paced: 1.72 %
Brady Statistic RV Percent Paced: 27.18 %
Date Time Interrogation Session: 20211011031158
Implantable Lead Implant Date: 20210712
Implantable Lead Implant Date: 20210712
Implantable Lead Location: 753859
Implantable Lead Location: 753860
Implantable Lead Model: 5076
Implantable Lead Model: 5076
Implantable Pulse Generator Implant Date: 20210712
Lead Channel Impedance Value: 266 Ohm
Lead Channel Impedance Value: 342 Ohm
Lead Channel Impedance Value: 418 Ohm
Lead Channel Impedance Value: 513 Ohm
Lead Channel Pacing Threshold Amplitude: 0.625 V
Lead Channel Pacing Threshold Amplitude: 0.75 V
Lead Channel Pacing Threshold Pulse Width: 0.4 ms
Lead Channel Pacing Threshold Pulse Width: 0.4 ms
Lead Channel Sensing Intrinsic Amplitude: 0.625 mV
Lead Channel Sensing Intrinsic Amplitude: 0.625 mV
Lead Channel Sensing Intrinsic Amplitude: 10.5 mV
Lead Channel Sensing Intrinsic Amplitude: 10.5 mV
Lead Channel Setting Pacing Amplitude: 3.25 V
Lead Channel Setting Pacing Amplitude: 3.5 V
Lead Channel Setting Pacing Pulse Width: 0.4 ms
Lead Channel Setting Sensing Sensitivity: 0.9 mV

## 2020-02-22 ENCOUNTER — Ambulatory Visit (INDEPENDENT_AMBULATORY_CARE_PROVIDER_SITE_OTHER): Payer: Medicare PPO

## 2020-02-22 DIAGNOSIS — I4819 Other persistent atrial fibrillation: Secondary | ICD-10-CM | POA: Diagnosis not present

## 2020-02-23 DIAGNOSIS — R350 Frequency of micturition: Secondary | ICD-10-CM | POA: Diagnosis not present

## 2020-02-23 DIAGNOSIS — R3914 Feeling of incomplete bladder emptying: Secondary | ICD-10-CM | POA: Diagnosis not present

## 2020-02-23 DIAGNOSIS — N281 Cyst of kidney, acquired: Secondary | ICD-10-CM | POA: Diagnosis not present

## 2020-02-23 DIAGNOSIS — N3943 Post-void dribbling: Secondary | ICD-10-CM | POA: Diagnosis not present

## 2020-02-28 NOTE — Progress Notes (Signed)
Remote pacemaker transmission.   

## 2020-03-04 DIAGNOSIS — Z950 Presence of cardiac pacemaker: Secondary | ICD-10-CM | POA: Insufficient documentation

## 2020-03-05 ENCOUNTER — Other Ambulatory Visit: Payer: Self-pay

## 2020-03-05 ENCOUNTER — Encounter: Payer: Self-pay | Admitting: Allergy and Immunology

## 2020-03-05 ENCOUNTER — Ambulatory Visit (INDEPENDENT_AMBULATORY_CARE_PROVIDER_SITE_OTHER): Payer: Medicare PPO | Admitting: Allergy and Immunology

## 2020-03-05 VITALS — BP 140/86 | HR 64 | Temp 98.0°F | Resp 16 | Ht 68.0 in | Wt 188.0 lb

## 2020-03-05 DIAGNOSIS — K219 Gastro-esophageal reflux disease without esophagitis: Secondary | ICD-10-CM | POA: Diagnosis not present

## 2020-03-05 DIAGNOSIS — J4520 Mild intermittent asthma, uncomplicated: Secondary | ICD-10-CM

## 2020-03-05 DIAGNOSIS — J3089 Other allergic rhinitis: Secondary | ICD-10-CM

## 2020-03-05 NOTE — Patient Instructions (Signed)
  1.  Continue pantoprazole 40 mg daily  2.  If needed use the following medications:    A. Albuterol HFA 2 inhalations every 4-6 hours   B. OTC antihistamine & nasal saline  C.  Nasal ipratropium 0.06% 2 sprays each nostril every 6 hours  3. Visit with audiologist  4. Return to clinic in 12 months or earlier if problem   5. Obtain Covid booster vaccine and flu vaccine

## 2020-03-05 NOTE — Progress Notes (Signed)
[place]   Follow-up Note  Referring Provider: Chilton GreathouseAvva, Ravisankar, MD Primary Provider: Chilton GreathouseAvva, Ravisankar, MD Date of Office Visit: 03/05/2020  Subjective:   Jorge Cherry (DOB: 05/25/1935) is a 84 y.o. male who [facility] in re-evaluation of the following:  HPI: Gunter returns to this clinic in evaluation of allergic rhinitis and history of asthma and reflux with LPR.  His last visit to this clinic was 28 February 2019.  Overall he has done well with his respiratory tract.  He has not required a systemic steroid or an antibiotic to treat any type of airway issue.  He rarely if ever uses a short acting bronchodilator.  He uses nasal ipratropium as his only nasal spray.  He believes that his reflux is under very good control and has had very little issue with his throat.  He consistently uses pantoprazole.  He has received 2 Pfizer Covid vaccines.  His big issue for Kenlee at this point in time is the fact that he has been having a hearing problem.  He has been having problems with his hearing aid and his audiologist and getting things straightened out regarding his progressive tinnitus and hearing loss.  Since have seen him in his clinic he has undergone a aortic valve replacement on 14 November 2019 with good result.  Allergies as of 03/05/2020      Reactions   Altace [ramipril] Swelling, Other (See Comments)   Mouth swelling   Mucinex [guaifenesin Er] Hives, Swelling, Other (See Comments)   Mouth swelling   Contrast Media [iodinated Diagnostic Agents] Other (See Comments)   Made eyes change each time      Medication List      albuterol 108 (90 Base) MCG/ACT inhaler Commonly known as: VENTOLIN HFA INHALE TWO PUFFS EVERY 4-6 HOURS AS NEEDED FOR COUGH OR WHEEZE   amiodarone 200 MG tablet Commonly known as: PACERONE Take 1 tablet (200 mg total) by mouth daily.   amLODipine 2.5 MG  tablet Commonly known as: NORVASC TAKE 1 TABLET BY MOUTH TWICE A DAY   apixaban 5 MG Tabs tablet Commonly known as: ELIQUIS Take 1 tablet (5 mg total) by mouth 2 (two) times daily.   atorvastatin 20 MG tablet Commonly known as: LIPITOR Take 1 tablet (20 mg total) by mouth at bedtime.   Centrum Silver 50+Men Tabs Take 1 tablet by mouth at bedtime.   ezetimibe 10 MG tablet Commonly known as: ZETIA Take 1 tablet (10 mg total) by mouth daily.   fenofibrate 145 MG tablet Commonly known as: TRICOR TAKE 0.5 TABLETS (72.5 MG TOTAL) BY MOUTH DAILY.   Fish Oil 1000 MG Caps Take 1,000 mg by mouth 2 (two) times daily.   fluticasone 50 MCG/ACT nasal spray Commonly known as: FLONASE Place 2 sprays into both nostrils daily as needed for allergies or rhinitis.   ipratropium 0.06 % nasal spray Commonly known as: ATROVENT Can use two sprays in each nostril every six hours as needed to dry up nose.   isosorbide mononitrate 60 MG 24 hr tablet Commonly known as: IMDUR TAKE 1 AND 1/2 TABS (90MG ) BY MOUTH EVERY MORNING AND 1/2 TAB EVERY EVENING   levothyroxine 25 MCG tablet Commonly known as: SYNTHROID Take 25 mcg by mouth daily before breakfast.   loratadine 10 MG tablet Commonly known as: CLARITIN Take 10 mg by mouth every evening.   nitroGLYCERIN 0.4 MG/SPRAY spray Commonly known  as: NITROLINGUAL PLACE 1 SPRAY UNDER THE TONGUE EVERY 5 (FIVE) MINUTES X 3 DOSES AS NEEDED FOR CHEST PAIN.   pantoprazole 40 MG tablet Commonly known as: PROTONIX TAKE 1 TABLET BY MOUTH EVERY DAY   ranolazine 500 MG 12 hr tablet Commonly known as: RANEXA TAKE 1 TABLET BY MOUTH TWICE A DAY   vitamin C 500 MG tablet Commonly known as: ASCORBIC ACID Take 500 mg by mouth at bedtime.   vitamin E 180 MG (400 UNITS) capsule Take 400 Units by mouth daily.   zinc gluconate 50 MG tablet Take 50 mg by mouth 2 (two) times a week.       Past Medical History:  Diagnosis Date  . Arthritis    "just a  touch in my hands" (11/24/2016)  . Asthma   . Atherosclerosis of renal artery (HCC)    RENAL DOPPLER, 12/10/2011 - Left renal artery demonstrated narrowing with elevated velocities consistent with a 1-59% diameter reduction  . CKD (chronic kidney disease) stage 3, GFR 30-59 ml/min (HCC)    "stable now since they backed off the water pills" (11/24/2016)  . Coronary artery disease    a. 1994 s/p CABG x 4 (LIMA-LAD, VG->D2, VG->OM, VG->RCA); b. 02/2004 PCI SVG-D2 (3.5x16 Taxus DES). VG->RCA 100. Sev apical LAD dzs distal to LIMA insertion; c. 11/2016 Cath: LAD 95p/164m, D2 100ost, LCX 100ost, 70p/m, RCA 100p/m, RPDA fills via L->R collats. VG->RCA 100, VG->D2 20 ost, patent prox stent, 13m, LIMA->LAD ok, VG->OM3 20p. EF 55%-->Med Rx.  . GERD (gastroesophageal reflux disease)   . High cholesterol   . History of lower GI bleeding    a. 09/2014 GIB due to diverticulosis/diverticulitis.  . Iron deficiency anemia   . Labile Hypertension   . Moderate aortic stenosis    a. 10/2011 Echo:  EF >55%, mild-mod TR, mild-mod AS, mod Ca2+ of AoV leaflets; b. 07/2016 Echo: EF 60-65%, no rwma, Gr1 DD, mod AS [(S) mean grad , peak grad . Valve area (VTI): 1.33cm^2, (Vmax) 1.44cm^2. Mild MR]; c. 02/2018 Echo: EF 55-60%, no rwma, GR1 DD, Mod AS [Peak Vel (S): 319cm/s, Mean grad (S) , peak grad (S) 76mmHg].  [name] PAF (paroxysmal atrial fibrillation) (HCC)    a. CHA2DS2VASc = 4-->Eliquis.  . S/P TAVR (transcatheter aortic valve replacement) 11/14/2019   s/p TAVR with a 26 mm Edwards S3U via the TF approach by Drs Clifton James & Bartle  . Sleep apnea     Past Surgical History:  Procedure Laterality Date  . CARDIAC CATHETERIZATION  02/27/2004   Coronary intervention and medical management  . CARDIAC CATHETERIZATION  11/24/2016  . CARDIOVERSION N/A 12/20/2019   Procedure: CARDIOVERSION;  Surgeon: Chilton Si, MD;  Location: [facility] ENDOSCOPY;  Service: Cardiovascular;  Laterality: N/A;  . CARDIOVERSION N/A  02/05/2020   Procedure: CARDIOVERSION;  Surgeon: Wendall Stade, MD;  Location: [facility] ENDOSCOPY;  Service: Cardiovascular;  Laterality: N/A;  . CATARACT EXTRACTION W/ INTRAOCULAR LENS IMPLANT Left   . CORONARY ANGIOPLASTY  1994 X 2   "before bypass surgery"  . CORONARY ANGIOPLASTY WITH STENT PLACEMENT  03/04/2004   SVG supplying the diagonal vessel stented with a 3.5x9mm Taxus stent post dilated to 4.0 mm  . CORONARY ARTERY BYPASS GRAFT  1994   "CABG X4"  . INGUINAL HERNIA REPAIR Right   . PACEMAKER IMPLANT N/A 11/20/2019   Procedure: PACEMAKER IMPLANT;  Surgeon: Duke Salvia, MD;  Location: [facility] INVASIVE CV LAB;  Service: Cardiovascular;  Laterality: N/A;  . RIGHT HEART CATH AND  CORONARY/GRAFT ANGIOGRAPHY N/A 11/24/2016   Procedure: Right Heart Cath and Coronary/Graft Angiography;  Surgeon: Lennette Bihari, MD;  Location: [facility] INVASIVE CV LAB;  Service: Cardiovascular;  Laterality: N/A;  . RIGHT/LEFT HEART CATH AND CORONARY/GRAFT ANGIOGRAPHY N/A 10/18/2019   Procedure: RIGHT/LEFT HEART CATH AND CORONARY/GRAFT ANGIOGRAPHY;  Surgeon: Lennette Bihari, MD;  Location: [facility] INVASIVE CV LAB;  Service: Cardiovascular;  Laterality: N/A;  . TEE WITHOUT CARDIOVERSION N/A 11/14/2019   Procedure: TRANSESOPHAGEAL ECHOCARDIOGRAM (TEE);  Surgeon: Kathleene Hazel, MD;  Location: [facility] INVASIVE CV LAB;  Service: Open Heart Surgery;  Laterality: N/A;  . TONSILLECTOMY AND ADENOIDECTOMY    . TRANSCATHETER AORTIC VALVE REPLACEMENT, TRANSFEMORAL N/A 11/14/2019   Procedure: TRANSCATHETER AORTIC VALVE REPLACEMENT, TRANSFEMORAL;  Surgeon: Kathleene Hazel, MD;  Location: [facility] INVASIVE CV LAB;  Service: Open Heart Surgery;  Laterality: N/A;    Review of systems negative except as noted in HPI / PMHx or noted below:  Review of Systems  Constitutional: Negative.   HENT: Negative.   Eyes: Negative.   Respiratory: Negative.   Cardiovascular: Negative.   Gastrointestinal: Negative.   Genitourinary: Negative.    Musculoskeletal: Negative.   Skin: Negative.   Neurological: Negative.   Endo/Heme/Allergies: Negative.   Psychiatric/Behavioral: Negative.      Objective:   Vitals:   03/05/20 1028  BP: 140/86  Pulse: 64  Resp: 16  Temp: 98 F (36.7 C)  SpO2: 98%   Height: 5\' 8"  (172.7 cm)  Weight: 188 lb (85.3 kg)   Physical Exam Constitutional:      Appearance: He is not diaphoretic.  HENT:     Head: Normocephalic.     Right Ear: External ear normal.     Left Ear: Tympanic membrane, ear canal and external ear normal.     Ears:     Comments: Hearing aid    Nose: Nose normal. No mucosal edema or rhinorrhea.     Mouth/Throat:     Pharynx: Uvula midline. No oropharyngeal exudate.  Eyes:     Conjunctiva/sclera: Conjunctivae normal.  Neck:     Thyroid: No thyromegaly.     Trachea: Trachea normal. No tracheal tenderness or tracheal deviation.  Cardiovascular:     Rate and Rhythm: Normal rate and regular rhythm.     Heart sounds: S1 normal and S2 normal. Murmur (Systolic) heard.   Pulmonary:     Effort: No respiratory distress.     Breath sounds: Normal breath sounds. No stridor. No wheezing or rales.  Lymphadenopathy:     Head:     Right side of head: No tonsillar adenopathy.     Left side of head: No tonsillar adenopathy.     Cervical: No cervical adenopathy.  Skin:    Findings: No erythema or rash.     Nails: There is no clubbing.  Neurological:     Mental Status: He is alert.     Diagnostics:    Spirometry was performed and demonstrated an FEV1 of 2.66 at 103 % of predicted.  The patient had an Asthma Control Test with the following results: ACT Total Score: 24.    Assessment and Plan:   1. Asthma, mild intermittent, well-controlled   2. Perennial allergic rhinitis   3. LPRD (laryngopharyngeal reflux disease)     1.  Continue pantoprazole 40 mg daily  2.  If needed use the following medications:    A. Albuterol HFA 2 inhalations every 4-6 hours   B. OTC  antihistamine & nasal saline  C.  Nasal ipratropium 0.06% 2 sprays each nostril every 6 hours  3. Visit with audiologist  4. Return to clinic in 12 months or earlier if problem   5. Obtain Covid booster vaccine and flu vaccine  Aristide appears to be doing very well on his current therapy regarding control of his respiratory tract symptoms and his reflux issue.  His hearing is another matter.  He really needs to get this under good control and I have informed him that if he is not happy with the audiologist he is seeing at this point in time that maybe he needs to move on to a different audiologist.  I will see him back in his clinic in 12 months or earlier if there is a problem.  Laurette Schimke, MD Allergy / Immunology [facility]

## 2020-03-06 ENCOUNTER — Encounter: Payer: Self-pay | Admitting: Allergy and Immunology

## 2020-03-06 ENCOUNTER — Encounter: Payer: Self-pay | Admitting: Internal Medicine

## 2020-03-06 ENCOUNTER — Ambulatory Visit (INDEPENDENT_AMBULATORY_CARE_PROVIDER_SITE_OTHER): Payer: Medicare PPO | Admitting: Internal Medicine

## 2020-03-06 VITALS — BP 164/70 | HR 73 | Ht 68.0 in | Wt 187.2 lb

## 2020-03-06 DIAGNOSIS — I4819 Other persistent atrial fibrillation: Secondary | ICD-10-CM

## 2020-03-06 DIAGNOSIS — I442 Atrioventricular block, complete: Secondary | ICD-10-CM | POA: Diagnosis not present

## 2020-03-06 DIAGNOSIS — R001 Bradycardia, unspecified: Secondary | ICD-10-CM

## 2020-03-06 DIAGNOSIS — Z950 Presence of cardiac pacemaker: Secondary | ICD-10-CM

## 2020-03-06 LAB — CUP PACEART INCLINIC DEVICE CHECK
Battery Remaining Longevity: 166 mo
Battery Voltage: 3.2 V
Brady Statistic AP VP Percent: 0.27 %
Brady Statistic AP VS Percent: 14.47 %
Brady Statistic AS VP Percent: 0.08 %
Brady Statistic AS VS Percent: 85.04 %
Brady Statistic RA Percent Paced: 1.09 %
Brady Statistic RV Percent Paced: 32.16 %
Date Time Interrogation Session: 20211027143340
Implantable Lead Implant Date: 20210712
Implantable Lead Implant Date: 20210712
Implantable Lead Location: 753859
Implantable Lead Location: 753860
Implantable Lead Model: 5076
Implantable Lead Model: 5076
Implantable Pulse Generator Implant Date: 20210712
Lead Channel Impedance Value: 285 Ohm
Lead Channel Impedance Value: 361 Ohm
Lead Channel Impedance Value: 437 Ohm
Lead Channel Impedance Value: 532 Ohm
Lead Channel Pacing Threshold Amplitude: 1 V
Lead Channel Pacing Threshold Pulse Width: 0.4 ms
Lead Channel Sensing Intrinsic Amplitude: 0.8 mV
Lead Channel Sensing Intrinsic Amplitude: 12 mV
Lead Channel Setting Pacing Amplitude: 2.5 V
Lead Channel Setting Pacing Amplitude: 3.5 V
Lead Channel Setting Pacing Pulse Width: 0.4 ms
Lead Channel Setting Sensing Sensitivity: 0.9 mV

## 2020-03-06 NOTE — Patient Instructions (Signed)
Medication Instructions:   Your physician has recommended you make the following change in your medication:   Stop Amiodarone   *If you need a refill on your cardiac medications before your next appointment, please call your pharmacy*   Lab Work: None ordered.  If you have labs (blood work) drawn today and your tests are completely normal, you will receive your results only by: [name] MyChart Message (if you have MyChart) OR . A paper copy in the mail If you have any lab test that is abnormal or we need to change your treatment, we will call you to review the results.   Testing/Procedures: None ordered.    Follow-Up: At [facility], you and your health needs are our priority.  As part of our continuing mission to provide you with exceptional heart care, we have created designated Provider Care Teams.  These Care Teams include your primary Cardiologist (physician) and Advanced Practice Providers (APPs -  Physician Assistants and Nurse Practitioners) who all work together to provide you with the care you need, when you need it.  We recommend signing up for the patient portal called "MyChart".  Sign up information is provided on this After Visit Summary.  MyChart is used to connect with patients for Virtual Visits (Telemedicine).  Patients are able to view lab/test results, encounter notes, upcoming appointments, etc.  Non-urgent messages can be sent to your provider as well.   To learn more about what you can do with MyChart, go to [email or web address].    Your next appointment:   9 month(s)  The format for your next appointment:   In Person  Provider:   Sherryl Manges, MD

## 2020-03-06 NOTE — Progress Notes (Signed)
Patient Care Team: Chilton Greathouse, MD as PCP - General (Internal Medicine) Lennette Bihari, MD as PCP - Cardiology (Cardiology) Duke Salvia, MD as PCP - Electrophysiology (Cardiology) Lucie Leather Alvira Philips, MD as Consulting Physician (Allergy and Immunology)   HPI  Jorge Cherry is a 84 y.o. male seen in follow-up for right bundle branch block pre-TAVR and left bundle branch block post TAVR and syncope for which he underwent pacemaker implantation 7/21 (Medtronic) He has a history of ischemic heart disease with prior bypass surgery 1994 most recent catheterization 7/18 with a patent LIMA, patent vein graft to OM 3 and D2 and occlusion to his RCA.  EF was normal.   Persistent atrial fibrillation  anticoagulation with Apixoban    2 interval cardioversion, 8/11 and 02/05/2020.  Sinus rhythm held for less than 24 hours.  He was started on amiodarone following the first cardioversion.  Overall he has been feeling better. He has more energy. Less angina. Chronic asymmetric right greater than left edema without changes on the left.  No bleeding  LHC 6/21 patent grafts, occluded natives, EF by Echo 55-60-% DATE TEST EF   6/21   LIMA>>LAD; SVG>>OM3;SVG>>D2 patent  SVG>>RCA T  6/21 Echo   55-60 %   8/21 Echo  60%           Date Cr K Hgb  7/21/  1.39 3.9 11.4          Thromboembolic risk factors ( age  -2, HTN-1, TIA/CVA-2, Vasc disease -1) for a CHADSVASc Score of >6   Records and Results Reviewed ma'am  Past Medical History:  Diagnosis Date   Arthritis    "just a touch in my hands" (11/24/2016)   Asthma    Atherosclerosis of renal artery (HCC)    RENAL DOPPLER, 12/10/2011 - Left renal artery demonstrated narrowing with elevated velocities consistent with a 1-59% diameter reduction   CKD (chronic kidney disease) stage 3, GFR 30-59 ml/min (HCC)    "stable now since they backed off the water pills" (11/24/2016)   Coronary artery disease    a. 1994 s/p CABG x 4  (LIMA-LAD, VG->D2, VG->OM, VG->RCA); b. 02/2004 PCI SVG-D2 (3.5x16 Taxus DES). VG->RCA 100. Sev apical LAD dzs distal to LIMA insertion; c. 11/2016 Cath: LAD 95p/167m, D2 100ost, LCX 100ost, 70p/m, RCA 100p/m, RPDA fills via L->R collats. VG->RCA 100, VG->D2 20 ost, patent prox stent, 47m, LIMA->LAD ok, VG->OM3 20p. EF 55%-->Med Rx.   GERD (gastroesophageal reflux disease)    High cholesterol    History of lower GI bleeding    a. 09/2014 GIB due to diverticulosis/diverticulitis.   Iron deficiency anemia    Labile Hypertension    Moderate aortic stenosis    a. 10/2011 Echo:  EF >55%, mild-mod TR, mild-mod AS, mod Ca2+ of AoV leaflets; b. 07/2016 Echo: EF 60-65%, no rwma, Gr1 DD, mod AS [(S) mean grad , peak grad . Valve area (VTI): 1.33cm^2, (Vmax) 1.44cm^2. Mild MR]; c. 02/2018 Echo: EF 55-60%, no rwma, GR1 DD, Mod AS [Peak Vel (S): 319cm/s, Mean grad (S) , peak grad (S) 40mmHg].   PAF (paroxysmal atrial fibrillation) (HCC)    a. CHA2DS2VASc = 4-->Eliquis.   S/P TAVR (transcatheter aortic valve replacement) 11/14/2019   s/p TAVR with a 26 mm Edwards S3U via the TF approach by Drs Clifton James & Bartle   Sleep apnea     Past Surgical History:  Procedure Laterality Date   CARDIAC CATHETERIZATION  02/27/2004   Coronary  intervention and medical management   CARDIAC CATHETERIZATION  11/24/2016   CARDIOVERSION N/A 12/20/2019   Procedure: CARDIOVERSION;  Surgeon: Chilton Si, MD;  Location: [facility] ENDOSCOPY;  Service: Cardiovascular;  Laterality: N/A;   CARDIOVERSION N/A 02/05/2020   Procedure: CARDIOVERSION;  Surgeon: Wendall Stade, MD;  Location: [facility] ENDOSCOPY;  Service: Cardiovascular;  Laterality: N/A;   CATARACT EXTRACTION W/ INTRAOCULAR LENS IMPLANT Left    CORONARY ANGIOPLASTY  1994 X 2   "before bypass surgery"   CORONARY ANGIOPLASTY WITH STENT PLACEMENT  03/04/2004   SVG supplying the diagonal vessel stented with a 3.5x8mm Taxus stent post dilated to 4.0 mm    CORONARY ARTERY BYPASS GRAFT  1994   "CABG X4"   INGUINAL HERNIA REPAIR Right    PACEMAKER IMPLANT N/A 11/20/2019   Procedure: PACEMAKER IMPLANT;  Surgeon: Duke Salvia, MD;  Location: [facility] INVASIVE CV LAB;  Service: Cardiovascular;  Laterality: N/A;   RIGHT HEART CATH AND CORONARY/GRAFT ANGIOGRAPHY N/A 11/24/2016   Procedure: Right Heart Cath and Coronary/Graft Angiography;  Surgeon: Lennette Bihari, MD;  Location: [facility] INVASIVE CV LAB;  Service: Cardiovascular;  Laterality: N/A;   RIGHT/LEFT HEART CATH AND CORONARY/GRAFT ANGIOGRAPHY N/A 10/18/2019   Procedure: RIGHT/LEFT HEART CATH AND CORONARY/GRAFT ANGIOGRAPHY;  Surgeon: Lennette Bihari, MD;  Location: [facility] INVASIVE CV LAB;  Service: Cardiovascular;  Laterality: N/A;   TEE WITHOUT CARDIOVERSION N/A 11/14/2019   Procedure: TRANSESOPHAGEAL ECHOCARDIOGRAM (TEE);  Surgeon: Kathleene Hazel, MD;  Location: [facility] INVASIVE CV LAB;  Service: Open Heart Surgery;  Laterality: N/A;   TONSILLECTOMY AND ADENOIDECTOMY     TRANSCATHETER AORTIC VALVE REPLACEMENT, TRANSFEMORAL N/A 11/14/2019   Procedure: TRANSCATHETER AORTIC VALVE REPLACEMENT, TRANSFEMORAL;  Surgeon: Kathleene Hazel, MD;  Location: [facility] INVASIVE CV LAB;  Service: Open Heart Surgery;  Laterality: N/A;    Current Meds  Medication Sig   albuterol (VENTOLIN HFA) 108 (90 Base) MCG/ACT inhaler INHALE TWO PUFFS EVERY 4-6 HOURS AS NEEDED FOR COUGH OR WHEEZE   amiodarone (PACERONE) 200 MG tablet Take 1 tablet (200 mg total) by mouth daily.   amLODipine (NORVASC) 2.5 MG tablet TAKE 1 TABLET BY MOUTH TWICE A DAY   apixaban (ELIQUIS) 5 MG TABS tablet Take 1 tablet (5 mg total) by mouth 2 (two) times daily.   atorvastatin (LIPITOR) 20 MG tablet Take 1 tablet (20 mg total) by mouth at bedtime.   ezetimibe (ZETIA) 10 MG tablet Take 1 tablet (10 mg total) by mouth daily.   fenofibrate (TRICOR) 145 MG tablet TAKE 0.5 TABLETS (72.5 MG TOTAL) BY MOUTH DAILY.   fluticasone (FLONASE) 50  MCG/ACT nasal spray Place 2 sprays into both nostrils daily as needed for allergies or rhinitis.    ipratropium (ATROVENT) 0.06 % nasal spray Can use two sprays in each nostril every six hours as needed to dry up nose.   isosorbide mononitrate (IMDUR) 60 MG 24 hr tablet TAKE 1 AND 1/2 TABS (90MG ) BY MOUTH EVERY MORNING AND 1/2 TAB EVERY EVENING   levothyroxine (SYNTHROID) 25 MCG tablet Take 25 mcg by mouth daily before breakfast.    loratadine (CLARITIN) 10 MG tablet Take 10 mg by mouth every evening.    Multiple Vitamins-Minerals (CENTRUM SILVER 50+MEN) TABS Take 1 tablet by mouth at bedtime.   nitroGLYCERIN (NITROLINGUAL) 0.4 MG/SPRAY spray PLACE 1 SPRAY UNDER THE TONGUE EVERY 5 (FIVE) MINUTES X 3 DOSES AS NEEDED FOR CHEST PAIN.   Omega-3 Fatty Acids (FISH OIL) 1000 MG CAPS Take 1,000 mg by mouth 2 (two) times  daily.    pantoprazole (PROTONIX) 40 MG tablet TAKE 1 TABLET BY MOUTH EVERY DAY   ranolazine (RANEXA) 500 MG 12 hr tablet TAKE 1 TABLET BY MOUTH TWICE A DAY   vitamin C (ASCORBIC ACID) 500 MG tablet Take 500 mg by mouth at bedtime.    vitamin E 400 UNIT capsule Take 400 Units by mouth daily.   zinc gluconate 50 MG tablet Take 50 mg by mouth 2 (two) times a week.    Allergies  Allergen Reactions   Altace [Ramipril] Swelling and Other (See Comments)    Mouth swelling   Mucinex [Guaifenesin Er] Hives, Swelling and Other (See Comments)    Mouth swelling   Contrast Media [Iodinated Diagnostic Agents] Other (See Comments)    Made eyes change each time      Review of Systems negative except from HPI and PMH  Physical Exam BP (!) 164/70    Pulse 73    Ht 5\' 8"  (1.727 m)    Wt 187 lb 3.2 oz (84.9 kg)    SpO2 96%    BMI 28.46 kg/m  Well developed and well nourished in no acute distress HENT normal E scleral and icterus clear Neck Supple JVP flat; carotids brisk and full Clear to ausculation Irregular rate and rhythm with a 2-3/6 systolic murmur not changing with RR  intervals Soft with active bowel sounds No clubbing cyanosis  Edema Alert and oriented, grossly normal motor and sensory function Skin Warm and Dry  ECG atrial fibrillation with ventricular pacing at 73  CrCl cannot be calculated (Patient's most recent lab result is older than the maximum 21 days allowed.).   Assessment and  Plan  Syncope recurrent  RBBB pre tTAVR,  LBBB post TAVR  ICM, s/p CABG normal LV function  AF permanent   Pacemaker-Medtronic  HFpEF  Currently euvolemic apart from the asymmetric edema related to his venotomy leg  Discussion regarding the role of cardioversion and ongoing antiarrhythmic therapy in the context of his atrial fibrillation which has reverted rapidly from sinus rhythm following 2 cardioversions. Given the paucity of symptoms and indeed the fact that he is feeling better we have elected to discontinue amiodarone and to pursue a strategy of rate control. He is advised that as the amiodarone washes out there may be an increase in his heart rate. He would let know and we would augment rate control.  No bleeding on his anticoagulation.  No recurrent syncope.   Current medicines are reviewed at length with the patient today .  The patient does not  have concerns regarding medicines.

## 2020-03-12 DIAGNOSIS — H903 Sensorineural hearing loss, bilateral: Secondary | ICD-10-CM | POA: Diagnosis not present

## 2020-03-14 NOTE — Telephone Encounter (Signed)
Please send to patients PCP

## 2020-03-21 ENCOUNTER — Telehealth: Payer: Self-pay

## 2020-03-21 NOTE — Telephone Encounter (Signed)
Dr Kozlow please advise 

## 2020-03-21 NOTE — Telephone Encounter (Signed)
Patients wife called wanting to know if Jorge Cherry can be put back on Dymista as he stays with head congestion/stuffiness? Wife states he had some nose bleeding before with it but it helped. She is wondering what other side effects to lookout for while taking this medication.  [facility]   Please Advise.

## 2020-03-25 NOTE — Telephone Encounter (Signed)
Left message for patient to call back  

## 2020-03-25 NOTE — Telephone Encounter (Signed)
Please inform Jorge Cherry's wife that he can go back on Dymista but attempt to use it only 3 days a week such as Monday Wednesday and Friday.  There is a component in Dymista that is long-lasting and hopefully will help him throughout the week on that lower dose without precipitating nosebleeds.

## 2020-03-28 MED ORDER — AZELASTINE-FLUTICASONE 137-50 MCG/ACT NA SUSP: NASAL | 5 refills | Status: DC

## 2020-03-28 NOTE — Telephone Encounter (Signed)
Spoke with patient's wife and informed her if medication is not covered then we will have to send them in separately.

## 2020-04-11 DIAGNOSIS — R3914 Feeling of incomplete bladder emptying: Secondary | ICD-10-CM | POA: Diagnosis not present

## 2020-04-11 DIAGNOSIS — N401 Enlarged prostate with lower urinary tract symptoms: Secondary | ICD-10-CM | POA: Diagnosis not present

## 2020-04-11 DIAGNOSIS — N281 Cyst of kidney, acquired: Secondary | ICD-10-CM | POA: Diagnosis not present

## 2020-04-11 DIAGNOSIS — N3943 Post-void dribbling: Secondary | ICD-10-CM | POA: Diagnosis not present

## 2020-04-16 DIAGNOSIS — L570 Actinic keratosis: Secondary | ICD-10-CM | POA: Diagnosis not present

## 2020-04-16 DIAGNOSIS — D692 Other nonthrombocytopenic purpura: Secondary | ICD-10-CM | POA: Diagnosis not present

## 2020-04-16 DIAGNOSIS — D1801 Hemangioma of skin and subcutaneous tissue: Secondary | ICD-10-CM | POA: Diagnosis not present

## 2020-04-16 DIAGNOSIS — L814 Other melanin hyperpigmentation: Secondary | ICD-10-CM | POA: Diagnosis not present

## 2020-04-16 DIAGNOSIS — L821 Other seborrheic keratosis: Secondary | ICD-10-CM | POA: Diagnosis not present

## 2020-04-18 ENCOUNTER — Other Ambulatory Visit: Payer: Self-pay | Admitting: Cardiovascular Disease

## 2020-04-18 ENCOUNTER — Other Ambulatory Visit: Payer: Self-pay | Admitting: Allergy and Immunology

## 2020-04-22 ENCOUNTER — Encounter: Payer: Self-pay | Admitting: Cardiovascular Disease

## 2020-04-22 ENCOUNTER — Other Ambulatory Visit: Payer: Self-pay

## 2020-04-22 ENCOUNTER — Ambulatory Visit (INDEPENDENT_AMBULATORY_CARE_PROVIDER_SITE_OTHER): Payer: Medicare PPO | Admitting: Cardiovascular Disease

## 2020-04-22 VITALS — BP 144/78 | HR 62 | Ht 68.0 in | Wt 192.8 lb

## 2020-04-22 DIAGNOSIS — Z7901 Long term (current) use of anticoagulants: Secondary | ICD-10-CM | POA: Diagnosis not present

## 2020-04-22 DIAGNOSIS — I4811 Longstanding persistent atrial fibrillation: Secondary | ICD-10-CM

## 2020-04-22 DIAGNOSIS — I25700 Atherosclerosis of coronary artery bypass graft(s), unspecified, with unstable angina pectoris: Secondary | ICD-10-CM | POA: Diagnosis not present

## 2020-04-22 DIAGNOSIS — N183 Chronic kidney disease, stage 3 unspecified: Secondary | ICD-10-CM | POA: Diagnosis not present

## 2020-04-22 DIAGNOSIS — G4733 Obstructive sleep apnea (adult) (pediatric): Secondary | ICD-10-CM

## 2020-04-22 DIAGNOSIS — E785 Hyperlipidemia, unspecified: Secondary | ICD-10-CM | POA: Diagnosis not present

## 2020-04-22 DIAGNOSIS — Z952 Presence of prosthetic heart valve: Secondary | ICD-10-CM

## 2020-04-22 DIAGNOSIS — Z950 Presence of cardiac pacemaker: Secondary | ICD-10-CM

## 2020-04-22 DIAGNOSIS — R600 Localized edema: Secondary | ICD-10-CM | POA: Diagnosis not present

## 2020-04-22 MED ORDER — RANOLAZINE ER 1000 MG PO TB12: 1000.0000 mg | ORAL_TABLET | Freq: Two times a day (BID) | ORAL | 3 refills | Status: DC

## 2020-04-22 MED ORDER — FUROSEMIDE 20 MG PO TABS: 20.0000 mg | ORAL_TABLET | Freq: Every day | ORAL | 3 refills | Status: DC

## 2020-04-22 NOTE — Progress Notes (Signed)
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 balance, headaches Skin: Negative; No rashes or skin lesions Psychiatric: Negative; No behavioral problems, depression Sleep: Positive for OSA with history of snoring, daytime sleepiness, no bruxism, restless legs, hypnogognic hallucinations, no cataplexy Other comprehensive 14 point system review is negative.  PE BP (!) 144/78   Pulse 62   Ht _0  (1.727 m)   Wt 192 lb 12.8 oz (87.5 kg)   SpO2 98%   BMI 29.32 kg/m    Repeat blood pressure by me was 144/78  Wt Readings from Last 3 Encounters:  04/22/20 192 lb 12.8 oz (87.5 kg)  03/06/20 187 lb 3.2 oz (84.9 kg)  03/05/20 188 lb (85.3 kg)   General: Alert, oriented, no distress.  Skin: normal turgor, no rashes, warm and dry HEENT: Normocephalic, atraumatic. Pupils equal round and reactive to light; sclera anicteric; extraocular muscles intact;  Nose without nasal septal hypertrophy Mouth/Parynx benign; Mallinpatti scale 3 Neck: No JVD, no carotid bruits; normal carotid upstroke Lungs: clear to ausculatation and percussion; no wheezing or rales Chest wall: without tenderness to palpitation Heart: PMI not displaced, RRR, s1 s2 normal, 1/6 systolic murmur, no diastolic murmur, no rubs, gallops, thrills, or  heaves Abdomen: soft, nontender; no hepatosplenomehaly, BS+; abdominal aorta nontender and not dilated by palpation. Back: no CVA tenderness Pulses 2+ Musculoskeletal: full range of motion, normal strength, no joint deformities Extremities: 2+ right lower extremity edema and 1+ left lower extremity.  No clubbing cyanosis or edema, Homan's sign negative  Neurologic: grossly nonfocal; Cranial nerves grossly wnl Psychologic: Normal mood and affect   ECG (independently read by me): Atrial fibrillation at 62 with a competing junctional pacemaker, Left axis deviation, QTc 460 msec  March 2020 ECG (independently read by me): Sinus rhythm at 71 bpm.  First-degree block with 212 ms.  Right bundle branch block with repolarization changes.  Left anterior hemiblock.  January 26, 2018 ECG (independently read by me): Sinus bradycardia at 47 bpm with first-degree AV block.  PR interval 218 ms.  LVH with QRS widening.  Left axis deviation.  October 28, 2017 ECG (independently read by me): Atrial fibrillation with variable rate that seems to be more in the 50s to 60s but following a pause drops into the 40s  August 26, 2017 ECG (independently read by me): atrial fibrillation at 56 bpm.  Nonspecific ST changes.  QTc interval 476 ms.  February 2019 ECG (independently read by me): Atrial  fibrillation at 71 bpm.  LVH by voltage in aVL.  Nonspecific T changes.  04/27/2017 ECG (independently read by me): Normal sinus rhythm at 60 bpm.  Nonspecific intraventricular block.  Anterior T-wave abnormality  03/23/2017 ECG (independently read by me): atrial fibrillation at 64 bpm.  Nonspecific interventricular block.  Nonspecific T wave abnormality.  02/17/2017 ECG (independently read by me): Atrial fibrillation at 72 bpm.  RV conduction delay.  QTc interval 453 ms.  01/29/2017 ECG (independently read by me): Atrial fibrillation at 56 bpm.  LVH by voltage criteria.  Nonspecific ST changes.  QTc interval 430 ms.  August  2018 ECG (independently read by me): Sinus bradycardia 59 bpm.  LVH by voltage.  QTc interval 469 ms.  No significant ST-T changes.  April 2018 ECG (independently read by me): Sinus rhythm at 61 bpm.  RV conduction delay.  LVH.  March 2018 ECG (independently read by me): Atrial fibrillation at 71 bpm.  RV conduction delay.  QTc interval 456 ms.  Left axis deviation.  February 2018 ECG (independently read by me): Normal sinus rhythm at 60 bpm.  LVH.  QTc interval at 464 ms.  Nonspecific T abnormality  December 2017 ECG (independently read by me):  Sinus bradycardia 57 bpm.  LVH by voltage. No significant ST changes.  January 10, 2016 ECG (independently read by me): Normal sinus rhythm at 64 bpm.  LVH.  QTc interval 468 ms.  December 2016 ECG (independently read by me):  Sinus bradycardia 51 bpm.  No ectopy. QTc interval 429 ms.  LVH by voltage criteria in aVL.  September 2016 ECG (independently read by me):  Sinus bradycardia with first-degree AV block. RV conduction delay. No significant ST-T changes.   August 2016 ECG (independently read by me): Sinus bradycardia 57 bpm.  Mild RV conduction delay.  May 2016 ECG (independently read by me): Sinus bradycardia at 49 bpm with first-degree AV block with a PR interval 218 ms.  Right reticular conduction delay.  December 2015 ECG (independently read by me).  For these: Sinus bradycardia 57 bpm.  First-degree AV block with a PR interval at 224 ms.  No significant ST-T changes.  March 2015 ECG (independently read by me): Sinus rhythm at 59 beats per minute. Mild LVH by voltage criteria in aVL. Normal intervals.  01/05/2013 ECG: Normal sinus rhythm at 62. Mild RV conduction delay. PR interval 204 ms, QTc interval 452 ms.  LABS: BMP Latest Ref Rng & Units 02/05/2020 01/03/2020 12/11/2019  Glucose 70 - 99 mg/dL 94 97 98  BUN 8 - 23 mg/dL _0 Creatinine 0.61 - 1.24 mg/dL 1.43(H) 1.36(H) 1.39(H)  BUN/Creat Ratio 10 - 24 - 14 -  Sodium 135 - 145  mmol/L 140 143 141  Potassium 3.5 - 5.1 mmol/L 3.8 3.9 4.0  Chloride 98 - 111 mmol/L 112(H) 108(H) 109  CO2 22 - 32 mmol/L _1 Calcium 8.9 - 10.3 mg/dL 9.0 8.8 8.9   Hepatic Function Latest Ref Rng & Units 11/10/2019 03/14/2018 03/13/2018  Total Protein 6.5 - 8.1 g/dL 5.8(L) - -  Albumin 3.5 - 5.0 g/dL 3.3(L) 2.2(L) 2.4(L)  AST 15 - 41 U/L 41 - -  ALT 0 - 44 U/L 50(H) - -  Alk Phosphatase 38 - 126 U/L 45 - -  Total Bilirubin 0.3 - 1.2 mg/dL 0.9 - -  Bilirubin, Direct 0.1 - 0.5 mg/dL - - -   CBC Latest Ref Rng & Units 02/05/2020 01/10/2020 12/11/2019  WBC 4.0 -  10.5 K/uL 6.4 5.7 5.1  Hemoglobin 13.0 - 17.0 g/dL 12.5(L) 11.6(L) 11.1(L)  Hematocrit 39.0 - 52.0 % 40.7 37.1(L) 35.9(L)  Platelets 150 - 400 K/uL 156 141(L) 122(L)   Lab Results  Component Value Date   MCV 97.1 02/05/2020   MCV 96.4 01/10/2020   MCV 97.3 12/11/2019   Lab Results  Component Value Date   TSH 1.744 11/20/2019   Lab Results  Component Value Date   HGBA1C 5.5 11/10/2019     Lipid Panel     Component Value Date/Time   CHOL 106 07/01/2016 1345   CHOL 137 04/09/2014 1115   TRIG 120 07/01/2016 1345   TRIG 182 (H) 04/09/2014 1115   HDL 35 (L) 07/01/2016 1345   HDL 32 (L) 04/09/2014 1115   CHOLHDL 3.0 07/01/2016 1345   VLDL 24 07/01/2016 1345   LDLCALC 47 07/01/2016 1345   LDLCALC 69 04/09/2014 1115     RADIOLOGY: No results found.  A CPAP download was obtained in the office with reference to his obstructive sleep apnea from August 18 through January 24, 2018. He is 100% compliant with usage days and averaging 6 hours and 35 minutes of usage daily.  At his 12 cm pressure, AHI is excellent at 0.9.  He does have a mask leak.  CPAP November 10 through April 18, 2018: Compliance 97% usage days and usage greater than 4 hours.  Average use 6 hours 46 minutes.  At 12 cm pressure, AHI 1.2/h.  CPAP download June 25, 2018 through July 24, 2018.  Usage 87%, usage greater than 4 hours 83%, average  usage on days used 6 hours and 34 minutes.  AHI 1.2 8012 cm pressure.  IMPRESSION:  1. Coronary artery disease involving coronary bypass graft of native heart with unstable angina pectoris ([place])   2. S/P placement of cardiac pacemaker   3. S/P TAVR (transcatheter aortic valve replacement)   4. Pacemaker   5. Longstanding persistent atrial fibrillation ([facility])   6. Stage 3 chronic kidney disease, unspecified whether stage 3a or 3b CKD ([place])   7. Hyperlipidemia LDL goal <70   8. Chronic anticoagulation   9. OSA (obstructive sleep apnea) on CPAP     ASSESSMENT AND PLAN: Jorge Cherry is an 84 year old Caucasian male who is status post CABG revascularization surgery in 1994.  Subsequently he underwent DES stenting to the graft supplying the diagonal vessel in 2003.  In July 2017 he was hospitalized with atrial fibrillation in the setting of bradycardia and hypotension leading to medication adjustment  An echo Doppler study of 11/21/2015 showed an EF of 55-60%, mild aortic stenosis with a valve area of 1.75 mm per there was mild pulmonary hypertension. Subsequently he had complained of experiencing some episodes of chest tightness and his symptoms improved with further titration of nitrates as well as beta blocker therapy.  He developed significant anemia, which undoubtedly exacerbated his anginal symptomatology.  At catheterization in July 2018 although his LIMA to mid LAD and vein graft to diagonal vessel are patent the very proximal LAD had a 95% stenosis which supplies a diagonal vessel, which was not supplied by the grafts.  His native RCA and graft to RCA was occluded and there is some collateralization to the distal RCA via the vein graft supplying the circumflex marginal vessel.  His anginal symptoms have improved with stabilization of his hemoglobin.  Spironolactone was discontinued secondary to renal insufficiency.  For 3 years he was treated with medication for his class II  anginal symptomatology.   However in June 2021 anginal symptoms progressed.  At that time repeat catheterization revealed similar CAD but progressive aortic valve stenosis.  He underwent successful TAVR with a 26 mm sapient 3 ultra TH VV the transfemoral approach on November 14, 2019.  Subsequently he developed several syncopal spells was found to have prolonged pauses of 8 and 11 seconds for which she required insertion of a dual-chamber Medtronic permanent pacemaker by Dr. Caryl Comes on November 20, 2019.  He had had recurrent episodes of atrial fibrillation and despite cardioversion return back to atrial fibrillation.  He has now been taken off amiodarone and is on rate control for his AF.  Since his TAVR energy level has significantly improved.  His chest pain is better but he still gets some anginal symptoms.  On exam today blood pressure is mildly elevated and he has leg edema 2+ in the right lower extremity and 1+ in the left lower extremity.  I have recommended the addition of furosemide 20 mg to his medical regimen.  In addition, now that he is off amiodarone, I have recommended further titration of Ranexa from 500 mg twice a day up to 1000 mg twice a day.  He continues to be on low-dose amlodipine 2.5 mg, isosorbide 90 mg in the morning and 30 mg at night.  He is on Zetia 10 mg, fenofibrate in addition to atorvastatin 20 mg daily for hyperlipidemia.  He had undergone laboratory by Dr. Dagmar Hait on January 19, 2020 which showed an LDL cholesterol at 54 with total cholesterol 111 and triglycerides 95.  Renal function was consistent with stage III CKD with a creatinine of 1.6.  He has now permanent atrial fibrillation with rate control management.  Amiodarone had been discontinued.  Atrial fibrillation rate is controlled with rate in the 60s with a competing junctional pacemaker on ECG today he is on Eliquis 5 mg twice a day for anticoagulation and denies bleeding.  He continues to be on low-dose levothyroxine 25 mcg for hypothyroidism.  He continues  to use CPAP.  I will see him in 3 to 4 months for follow-up evaluation or sooner as needed.   Time spent: 40  minutes Troy Sine, MD, [facility]  04/26/2020 8:28 AM

## 2020-04-22 NOTE — Patient Instructions (Signed)
Medication Instructions:  INCREASE ranolazine (Ranexa) to 1000 mg twice a day START taking furosemide (Lasix) 20 mg once a day *If you need a refill on your cardiac medications before your next appointment, please call your pharmacy*   Lab Work: None ordered If you have labs (blood work) drawn today and your tests are completely normal, you will receive your results only by: [name] MyChart Message (if you have MyChart) OR . A paper copy in the mail If you have any lab test that is abnormal or we need to change your treatment, we will call you to review the results.   Testing/Procedures: None ordered   Follow-Up: At [facility], you and your health needs are our priority.  As part of our continuing mission to provide you with exceptional heart care, we have created designated Provider Care Teams.  These Care Teams include your primary Cardiologist (physician) and Advanced Practice Providers (APPs -  Physician Assistants and Nurse Practitioners) who all work together to provide you with the care you need, when you need it.  We recommend signing up for the patient portal called "MyChart".  Sign up information is provided on this After Visit Summary.  MyChart is used to connect with patients for Virtual Visits (Telemedicine).  Patients are able to view lab/test results, encounter notes, upcoming appointments, etc.  Non-urgent messages can be sent to your provider as well.   To learn more about what you can do with MyChart, go to [email or web address].    Your next appointment:   4 month(s)  The format for your next appointment:   In Person  Provider:   Nicki Guadalajara, MD   Other Instructions None

## 2020-04-26 ENCOUNTER — Encounter: Payer: Self-pay | Admitting: Cardiovascular Disease

## 2020-05-06 DIAGNOSIS — G4733 Obstructive sleep apnea (adult) (pediatric): Secondary | ICD-10-CM | POA: Diagnosis not present

## 2020-05-22 DIAGNOSIS — I10 Essential (primary) hypertension: Secondary | ICD-10-CM | POA: Diagnosis not present

## 2020-05-22 DIAGNOSIS — G8911 Acute pain due to trauma: Secondary | ICD-10-CM | POA: Diagnosis not present

## 2020-05-22 DIAGNOSIS — I4891 Unspecified atrial fibrillation: Secondary | ICD-10-CM | POA: Diagnosis not present

## 2020-05-22 DIAGNOSIS — S61217A Laceration without foreign body of left little finger without damage to nail, initial encounter: Secondary | ICD-10-CM | POA: Diagnosis not present

## 2020-05-22 DIAGNOSIS — S0990XA Unspecified injury of head, initial encounter: Secondary | ICD-10-CM | POA: Diagnosis not present

## 2020-05-22 DIAGNOSIS — Z950 Presence of cardiac pacemaker: Secondary | ICD-10-CM | POA: Diagnosis not present

## 2020-05-22 DIAGNOSIS — S6992XA Unspecified injury of left wrist, hand and finger(s), initial encounter: Secondary | ICD-10-CM | POA: Diagnosis not present

## 2020-05-22 DIAGNOSIS — S0083XA Contusion of other part of head, initial encounter: Secondary | ICD-10-CM | POA: Diagnosis not present

## 2020-05-22 DIAGNOSIS — Z951 Presence of aortocoronary bypass graft: Secondary | ICD-10-CM | POA: Diagnosis not present

## 2020-05-22 DIAGNOSIS — I252 Old myocardial infarction: Secondary | ICD-10-CM | POA: Diagnosis not present

## 2020-05-23 ENCOUNTER — Ambulatory Visit (INDEPENDENT_AMBULATORY_CARE_PROVIDER_SITE_OTHER): Payer: Medicare PPO

## 2020-05-23 DIAGNOSIS — I4819 Other persistent atrial fibrillation: Secondary | ICD-10-CM | POA: Diagnosis not present

## 2020-05-23 LAB — CUP PACEART REMOTE DEVICE CHECK
Battery Remaining Longevity: 164 mo
Battery Voltage: 3.18 V
Brady Statistic RA Percent Paced: 0.05 %
Brady Statistic RV Percent Paced: 20.41 %
Date Time Interrogation Session: 20220112225710
Implantable Lead Implant Date: 20210712
Implantable Lead Implant Date: 20210712
Implantable Lead Location: 753859
Implantable Lead Location: 753860
Implantable Lead Model: 5076
Implantable Lead Model: 5076
Implantable Pulse Generator Implant Date: 20210712
Lead Channel Impedance Value: 285 Ohm
Lead Channel Impedance Value: 342 Ohm
Lead Channel Impedance Value: 418 Ohm
Lead Channel Impedance Value: 494 Ohm
Lead Channel Pacing Threshold Amplitude: 0.625 V
Lead Channel Pacing Threshold Amplitude: 0.875 V
Lead Channel Pacing Threshold Pulse Width: 0.4 ms
Lead Channel Pacing Threshold Pulse Width: 0.4 ms
Lead Channel Sensing Intrinsic Amplitude: 0.5 mV
Lead Channel Sensing Intrinsic Amplitude: 0.5 mV
Lead Channel Sensing Intrinsic Amplitude: 11.625 mV
Lead Channel Sensing Intrinsic Amplitude: 11.625 mV
Lead Channel Setting Pacing Amplitude: 2.5 V
Lead Channel Setting Pacing Amplitude: 3.5 V
Lead Channel Setting Pacing Pulse Width: 0.4 ms
Lead Channel Setting Sensing Sensitivity: 0.9 mV

## 2020-05-30 DIAGNOSIS — I5032 Chronic diastolic (congestive) heart failure: Secondary | ICD-10-CM | POA: Diagnosis not present

## 2020-05-30 DIAGNOSIS — E785 Hyperlipidemia, unspecified: Secondary | ICD-10-CM | POA: Diagnosis not present

## 2020-05-30 DIAGNOSIS — D509 Iron deficiency anemia, unspecified: Secondary | ICD-10-CM | POA: Diagnosis not present

## 2020-05-30 DIAGNOSIS — N1831 Chronic kidney disease, stage 3a: Secondary | ICD-10-CM | POA: Diagnosis not present

## 2020-05-30 DIAGNOSIS — R7989 Other specified abnormal findings of blood chemistry: Secondary | ICD-10-CM | POA: Diagnosis not present

## 2020-05-30 DIAGNOSIS — I130 Hypertensive heart and chronic kidney disease with heart failure and stage 1 through stage 4 chronic kidney disease, or unspecified chronic kidney disease: Secondary | ICD-10-CM | POA: Diagnosis not present

## 2020-05-30 DIAGNOSIS — E032 Hypothyroidism due to medicaments and other exogenous substances: Secondary | ICD-10-CM | POA: Diagnosis not present

## 2020-05-30 DIAGNOSIS — I480 Paroxysmal atrial fibrillation: Secondary | ICD-10-CM | POA: Diagnosis not present

## 2020-05-30 DIAGNOSIS — D692 Other nonthrombocytopenic purpura: Secondary | ICD-10-CM | POA: Diagnosis not present

## 2020-06-03 ENCOUNTER — Emergency Department: Payer: Medicare PPO

## 2020-06-03 ENCOUNTER — Other Ambulatory Visit: Payer: Self-pay

## 2020-06-03 ENCOUNTER — Emergency Department
Admission: EM | Admit: 2020-06-03 | Discharge: 2020-06-04 | Disposition: A | Discharge status: Home / Self Care | Payer: Medicare PPO | Attending: Emergency Medicine | Admitting: Emergency Medicine

## 2020-06-03 DIAGNOSIS — W000XXA Fall on same level due to ice and snow, initial encounter: Secondary | ICD-10-CM | POA: Insufficient documentation

## 2020-06-03 DIAGNOSIS — I2510 Atherosclerotic heart disease of native coronary artery without angina pectoris: Secondary | ICD-10-CM | POA: Insufficient documentation

## 2020-06-03 DIAGNOSIS — Z950 Presence of cardiac pacemaker: Secondary | ICD-10-CM | POA: Diagnosis not present

## 2020-06-03 DIAGNOSIS — J4530 Mild persistent asthma, uncomplicated: Secondary | ICD-10-CM | POA: Diagnosis not present

## 2020-06-03 DIAGNOSIS — I5033 Acute on chronic diastolic (congestive) heart failure: Secondary | ICD-10-CM | POA: Diagnosis not present

## 2020-06-03 DIAGNOSIS — Z79899 Other long term (current) drug therapy: Secondary | ICD-10-CM | POA: Diagnosis not present

## 2020-06-03 DIAGNOSIS — Z7901 Long term (current) use of anticoagulants: Secondary | ICD-10-CM | POA: Diagnosis not present

## 2020-06-03 DIAGNOSIS — S2242XA Multiple fractures of ribs, left side, initial encounter for closed fracture: Secondary | ICD-10-CM | POA: Diagnosis not present

## 2020-06-03 DIAGNOSIS — N183 Chronic kidney disease, stage 3 unspecified: Secondary | ICD-10-CM | POA: Insufficient documentation

## 2020-06-03 DIAGNOSIS — R0789 Other chest pain: Secondary | ICD-10-CM | POA: Diagnosis not present

## 2020-06-03 DIAGNOSIS — I130 Hypertensive heart and chronic kidney disease with heart failure and stage 1 through stage 4 chronic kidney disease, or unspecified chronic kidney disease: Secondary | ICD-10-CM | POA: Diagnosis not present

## 2020-06-03 DIAGNOSIS — S299XXA Unspecified injury of thorax, initial encounter: Secondary | ICD-10-CM | POA: Diagnosis present

## 2020-06-03 DIAGNOSIS — J90 Pleural effusion, not elsewhere classified: Secondary | ICD-10-CM | POA: Diagnosis not present

## 2020-06-03 DIAGNOSIS — J9811 Atelectasis: Secondary | ICD-10-CM | POA: Diagnosis not present

## 2020-06-03 DIAGNOSIS — R21 Rash and other nonspecific skin eruption: Secondary | ICD-10-CM | POA: Diagnosis not present

## 2020-06-03 IMAGING — CT CT CHEST W/O CM
2 of 3 series · 15 of 36 positions shown, 18 images · non-contrast
Comparison: [DATE]

CLINICAL DATA: Fall, left rib fracture

EXAM:
CT CHEST WITHOUT CONTRAST
TECHNIQUE: Multidetector CT imaging of the chest was performed following the
standard protocol without IV contrast.

[Series 2: thorax · axial · 0.75mm/px · z∈[-292,-40]mm · 12 of 150 slices shown, 15 images]
[im 12/150  mediastinal]
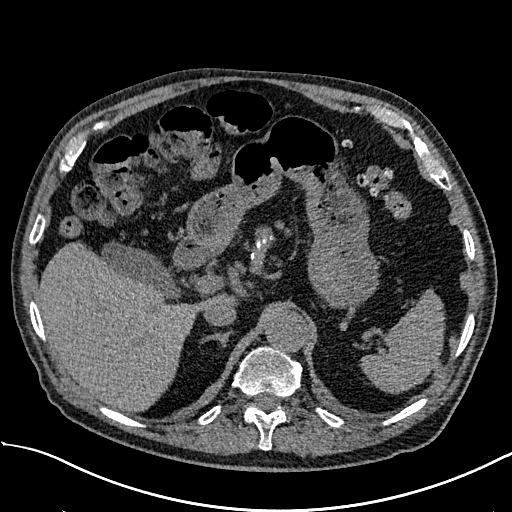
[im 12/150  lung]
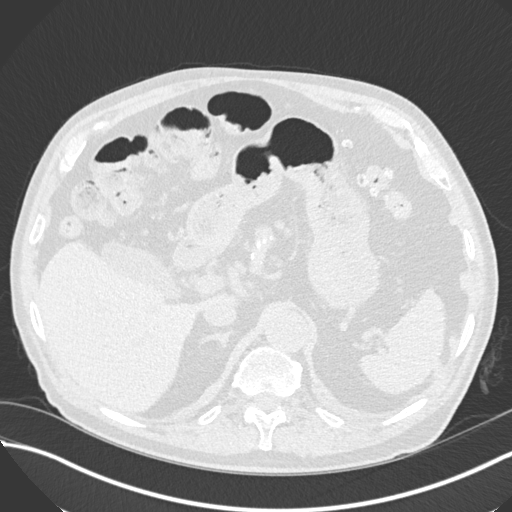
[im 23/150  lung]
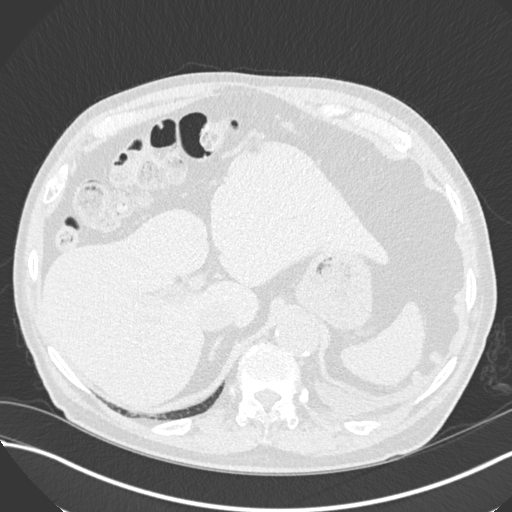
[im 34/150  lung]
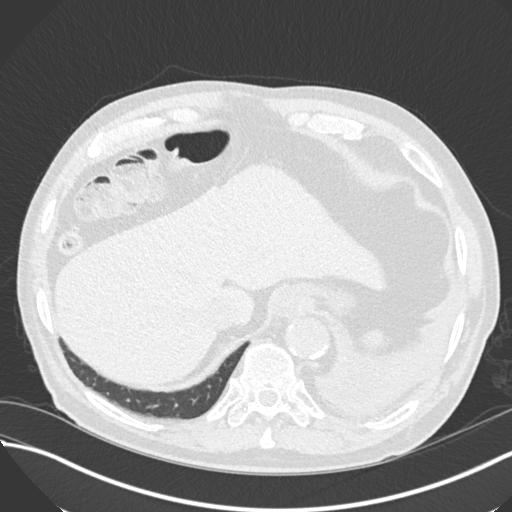
[im 45/150  lung]
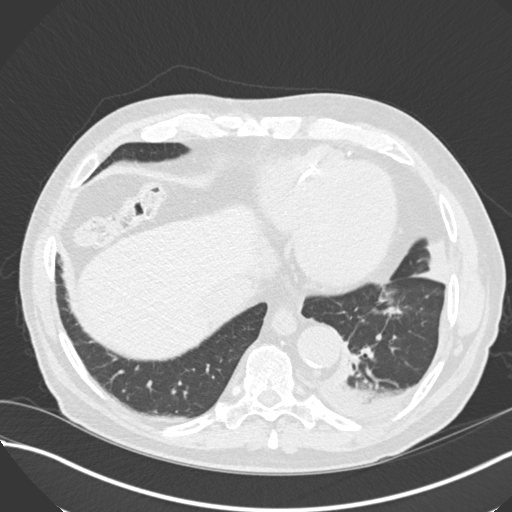
[im 56/150  mediastinal]
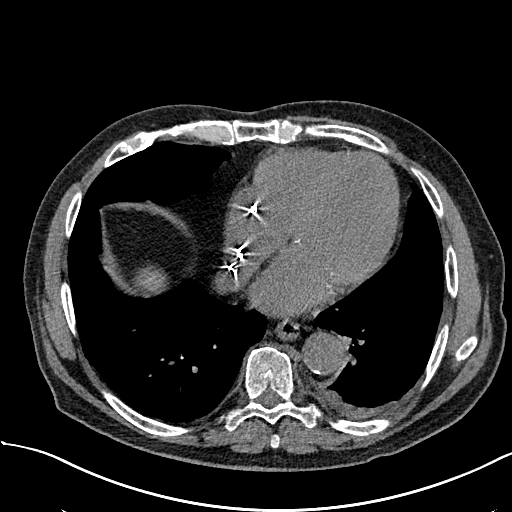
[im 56/150  lung]
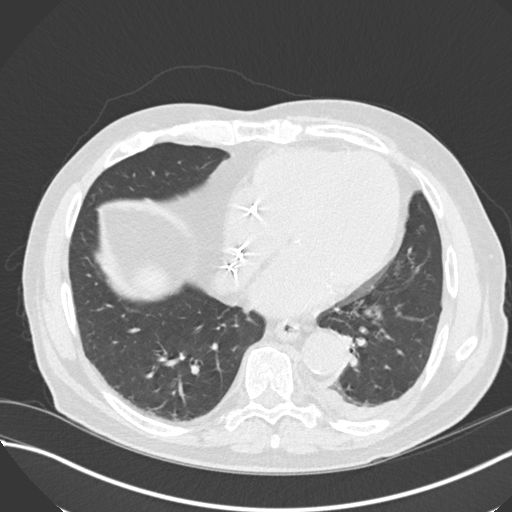
[im 67/150  lung]
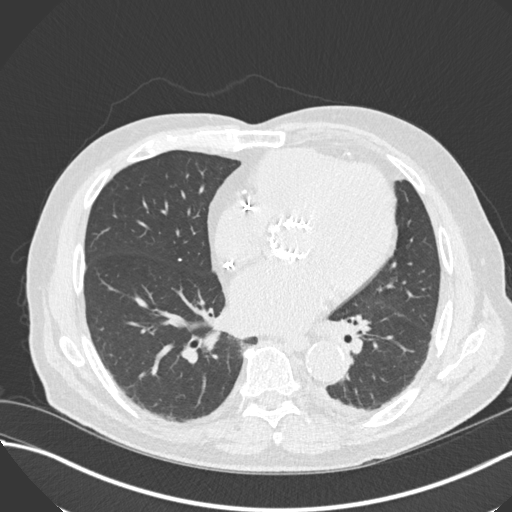
[im 83/150  lung]
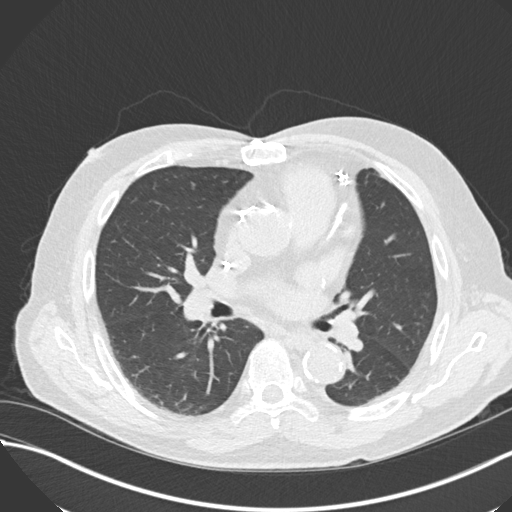
[im 94/150  lung]
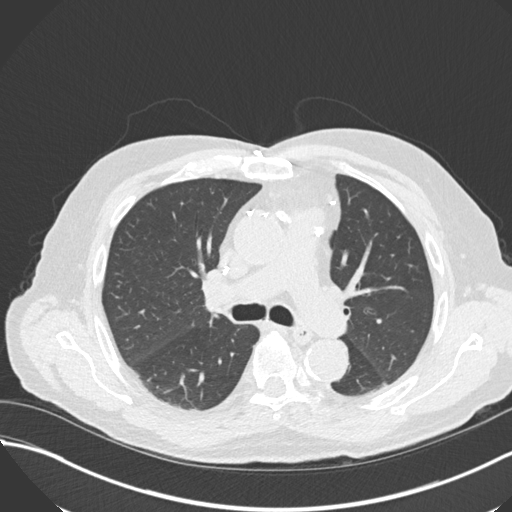
[im 105/150  mediastinal]
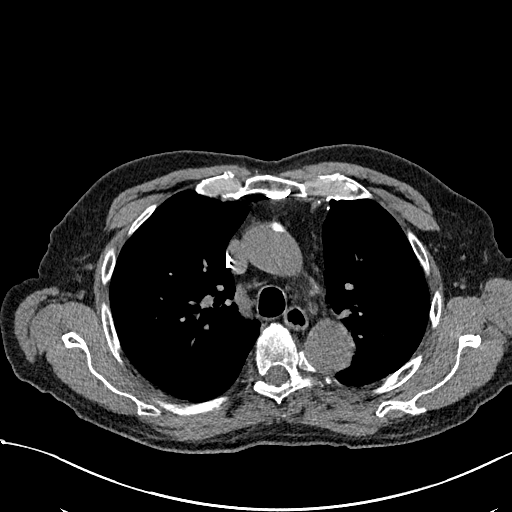
[im 105/150  lung]
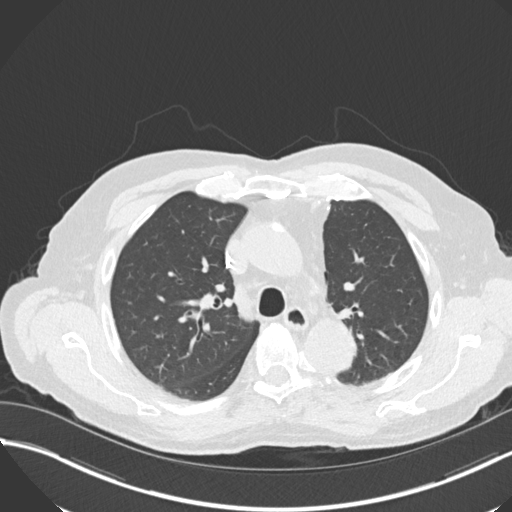
[im 116/150  lung]
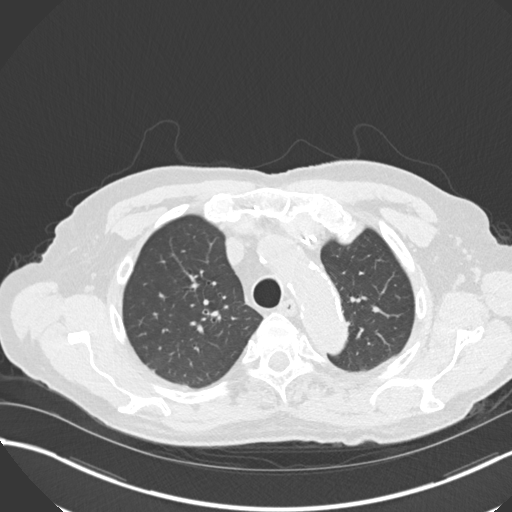
[im 127/150  lung]
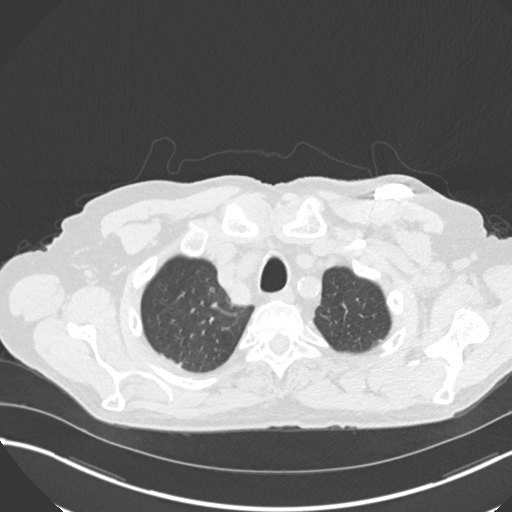
[im 138/150  lung]
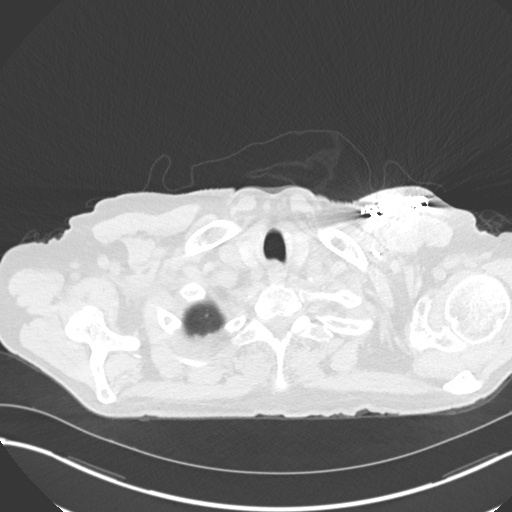

[Series 5: coronal · coronal · 0.62mm/px · 3 of 144 slices shown]
[im 29/144  lung]
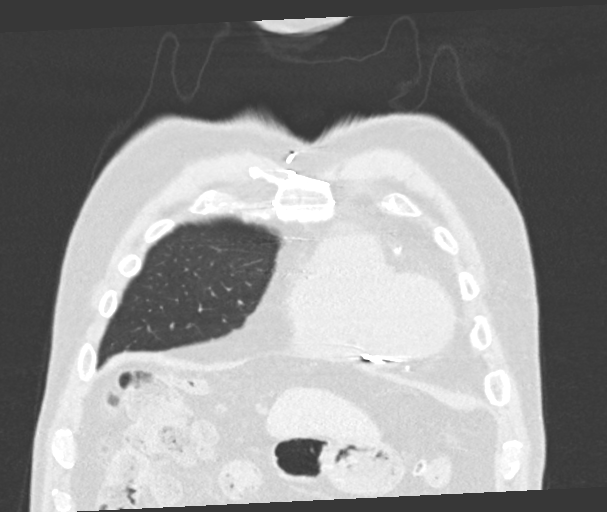
[im 58/144  lung]
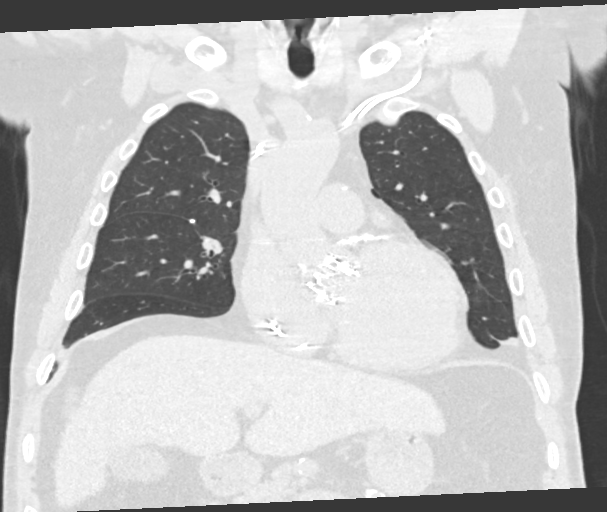
[im 86/144  lung]
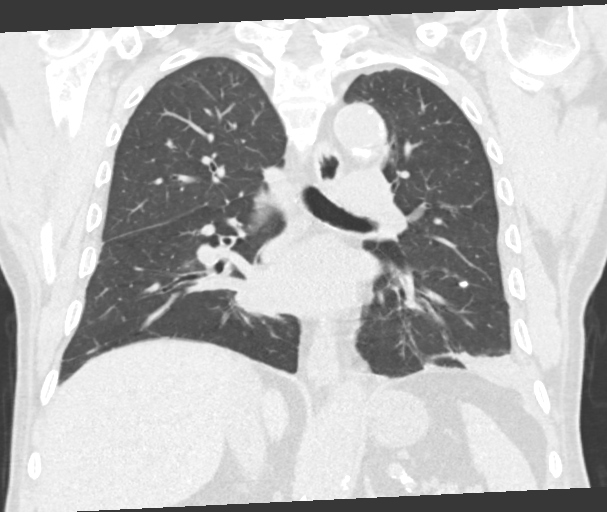

[15 of 36 positions shown; findings below may reference images not displayed]

FINDINGS: Cardiovascular: Coronary artery bypass grafting has been performed.
Extensive calcification is noted within the native coronary
arteries. Transcatheter aortic valve replacement has been performed.
Global cardiac size is within normal limits though mild dilation of
the left ventricle is noted. Left subclavian pacemaker is in place
with leads within the right atrium and ventricle. The central
pulmonary arteries are of normal caliber. Extensive atherosclerotic
calcification is seen within the a thoracic aorta. No aortic
aneurysm.

Mediastinum/Nodes: Thyroid unremarkable. No pathologic thoracic
adenopathy. Esophagus unremarkable. Small hiatal hernia.

Lungs/Pleura: Left basilar atelectasis. Trace left pleural effusion.
No pneumothorax. Right lung is clear. No central obstructing lesion

Upper Abdomen: No acute abnormality.

Musculoskeletal: There are acute fractures of the left twelfth rib
posteriorly, the left tenth rib posteriorly, and the left sixth and
seventh ribs laterally.
IMPRESSION: Multiple mildly displaced acute left rib fractures with associated
left basilar atelectasis. No pneumothorax.

Aortic Atherosclerosis ([VL]-[VL]).

## 2020-06-03 IMAGING — CR DG CHEST 2V
1 series · 2 of 2 positions shown · non-contrast
Comparison: Chest radiograph dated [DATE]

CLINICAL DATA: 84-year-old male with left chest wall pain after
fall.

EXAM:
CHEST - 2 VIEW

[Series 1: dg chest 2 view · 0.14mm/px · 2 of 2 slices shown]
[im 1/2]
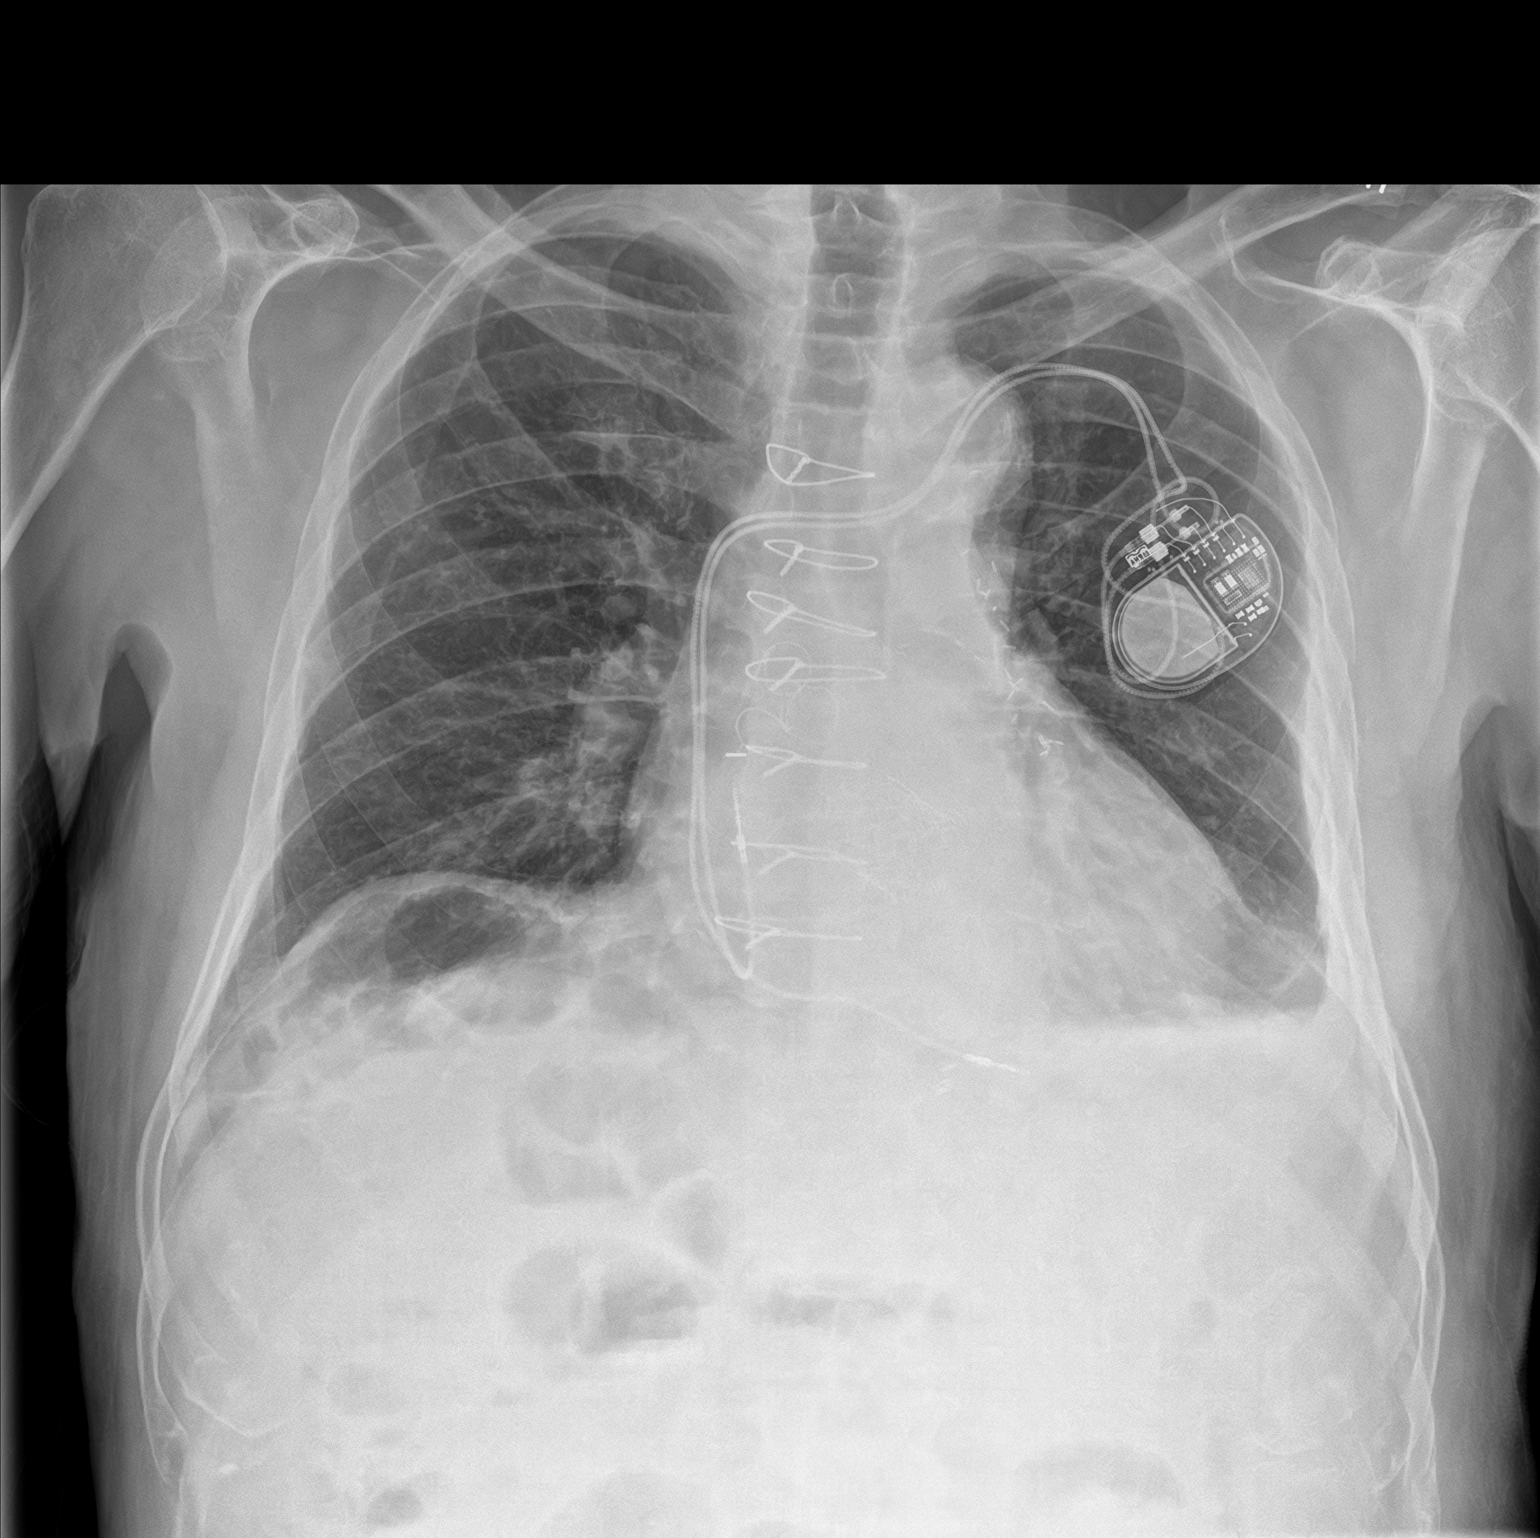
[im 2/2]
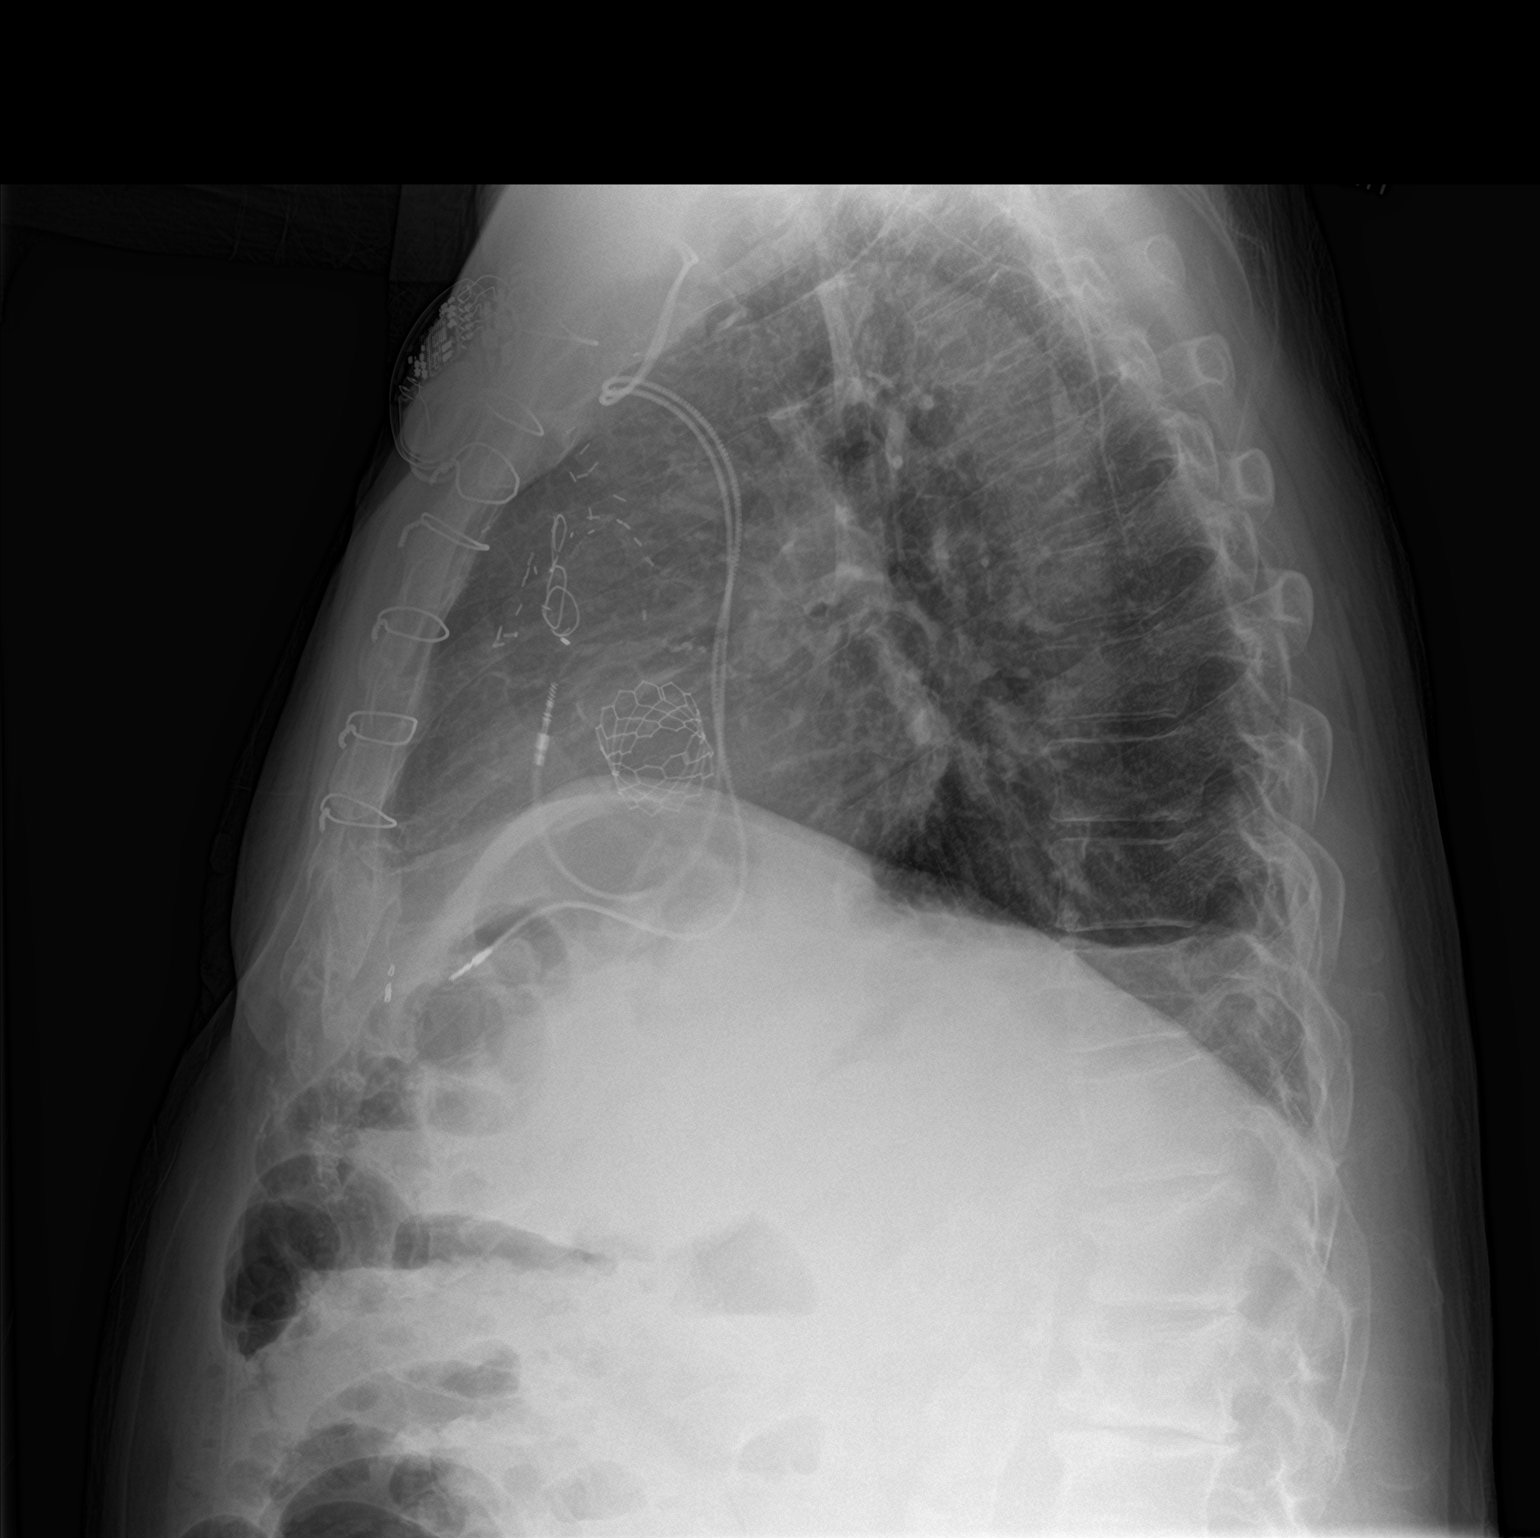

[2 of 2 positions shown; findings below may reference images not displayed]

FINDINGS: Small left pleural effusion and left lung base atelectasis. The
right lung is clear. No pneumothorax. Stable cardiac silhouette.
Median sternotomy wires and CABG vascular clips. Aortic valve
repair. Left pectoral pacemaker device. Artifact versus possible
minimally displaced fracture of the lateral left fifth rib.
Dedicated rib radiograph recommended. Chronic lower thoracic
compression fractures.
IMPRESSION: 1. Small left pleural effusion and left lung base atelectasis.
2. Possible minimally displaced fracture of the lateral left fifth
rib.

## 2020-06-03 NOTE — ED Triage Notes (Signed)
Pt in with co left rib pain states fell on ice yesterday. States fell on flat ground with left elbow under him. Only co is left rib pain, went to [facility] and xray showed "fluid build up" pt is on eliquis.

## 2020-06-03 NOTE — ED Provider Notes (Signed)
[facility] Emergency Department Provider Note  ____________________________________________  Time seen: Approximately 11:25 PM  I have reviewed the triage vital signs and the nursing notes.   HISTORY  Chief Complaint Fall    HPI Jorge Cherry is a 85 y.o. male with a history of CKD, CAD, hypertension, paroxysmal atrial fibrillation on Eliquis for stroke prevention who was sent to the ED from Ortho urgent care due to abnormal chest x-ray.  Patient reports he was in his usual state of health until yesterday when he fell on some ice, landing on his left side.  He had immediate left rib pain which is nonradiating, hurts with movement, moderate intensity, continuous.  No alleviating factors.  At the orthopedic office, chest x-ray showed 2 nondisplaced rib fractures and a small pleural effusion.  He has printed pictures of the x-rays with him which I viewed myself.      Past Medical History:  Diagnosis Date  . Arthritis    "just a touch in my hands" (11/24/2016)  . Asthma   . Atherosclerosis of renal artery (HCC)    RENAL DOPPLER, 12/10/2011 - Left renal artery demonstrated narrowing with elevated velocities consistent with a 1-59% diameter reduction  . CKD (chronic kidney disease) stage 3, GFR 30-59 ml/min (HCC)    "stable now since they backed off the water pills" (11/24/2016)  . Coronary artery disease    a. 1994 s/p CABG x 4 (LIMA-LAD, VG->D2, VG->OM, VG->RCA); b. 02/2004 PCI SVG-D2 (3.5x16 Taxus DES). VG->RCA 100. Sev apical LAD dzs distal to LIMA insertion; c. 11/2016 Cath: LAD 95p/14054m, D2 100ost, LCX 100ost, 70p/m, RCA 100p/m, RPDA fills via L->R collats. VG->RCA 100, VG->D2 20 ost, patent prox stent, 3661m, LIMA->LAD ok, VG->OM3 20p. EF 55%-->Med Rx.  . GERD (gastroesophageal reflux disease)   . High cholesterol   . History of lower GI bleeding    a. 09/2014 GIB due to diverticulosis/diverticulitis.  . Iron deficiency anemia   . Labile Hypertension   .  Moderate aortic stenosis    a. 10/2011 Echo:  EF >55%, mild-mod TR, mild-mod AS, mod Ca2+ of AoV leaflets; b. 07/2016 Echo: EF 60-65%, no rwma, Gr1 DD, mod AS [(S) mean grad 21mmHg, peak grad 34mmHg. Valve area (VTI): 1.33cm^2, (Vmax) 1.44cm^2. Mild MR]; c. 02/2018 Echo: EF 55-60%, no rwma, GR1 DD, Mod AS [Peak Vel (S): 319cm/s, Mean grad (S) 24mmHg, peak grad (S) 3341mmHg].  [name]. PAF (paroxysmal atrial fibrillation) (HCC)    a. CHA2DS2VASc = 4-->Eliquis.  . S/P TAVR (transcatheter aortic valve replacement) 11/14/2019   s/p TAVR with a 26 mm Edwards S3U via the TF approach by Drs Clifton JamesMcAlhany & Bartle  . Sleep apnea      Patient Active Problem List   Diagnosis Date Noted  . Pacemaker 03/04/2020  . Typical atrial flutter (HCC)   . Persistent atrial fibrillation (HCC) 11/29/2019  . Secondary hypercoagulable state (HCC) 11/29/2019  . Syncope and collapse 11/20/2019  . Heart block AV complete (HCC) 11/20/2019  . Renal lesion 11/15/2019  . S/P TAVR (transcatheter aortic valve replacement) 11/14/2019  . Bradycardia 03/03/2018  . Chronic anticoagulation 12/14/2017  . Anemia 11/25/2016  . Coronary artery disease 11/24/2016  . Acute on chronic diastolic heart failure (HCC) 05/26/2016  . Junctional (nodal) bradycardia 11/20/2015  . Paroxysmal atrial fibrillation (HCC) 11/20/2015  . Hyperlipidemia LDL goal <70 05/03/2015  . Mild persistent asthma 03/19/2015  . Gastroesophageal reflux disease 03/19/2015  . Essential hypertension 02/07/2015  . Pain in the chest   .  Chronic renal insufficiency, stage III (moderate) (HCC) 12/28/2014  . Lower GI bleed 09/24/2014  . Diverticulosis of colon with hemorrhage 09/24/2014  . Symptomatic anemia 09/24/2014  . Severe aortic stenosis 07/22/2013  . Hyperlipidemia with target LDL less than 70 10/27/2012  . Hearing decreased 10/27/2012  . Hx of CABG 10/27/2012  . Coronary atherosclerosis of native coronary artery 10/27/2012     Past Surgical History:  Procedure  Laterality Date  . CARDIAC CATHETERIZATION  02/27/2004   Coronary intervention and medical management  . CARDIAC CATHETERIZATION  11/24/2016  . CARDIOVERSION N/A 12/20/2019   Procedure: CARDIOVERSION;  Surgeon: Chilton Si, MD;  Location: [facility] ENDOSCOPY;  Service: Cardiovascular;  Laterality: N/A;  . CARDIOVERSION N/A 02/05/2020   Procedure: CARDIOVERSION;  Surgeon: Wendall Stade, MD;  Location: [facility] ENDOSCOPY;  Service: Cardiovascular;  Laterality: N/A;  . CATARACT EXTRACTION W/ INTRAOCULAR LENS IMPLANT Left   . CORONARY ANGIOPLASTY  1994 X 2   "before bypass surgery"  . CORONARY ANGIOPLASTY WITH STENT PLACEMENT  03/04/2004   SVG supplying the diagonal vessel stented with a 3.5x82mm Taxus stent post dilated to 4.0 mm  . CORONARY ARTERY BYPASS GRAFT  1994   "CABG X4"  . INGUINAL HERNIA REPAIR Right   . PACEMAKER IMPLANT N/A 11/20/2019   Procedure: PACEMAKER IMPLANT;  Surgeon: Duke Salvia, MD;  Location: [facility] INVASIVE CV LAB;  Service: Cardiovascular;  Laterality: N/A;  . RIGHT HEART CATH AND CORONARY/GRAFT ANGIOGRAPHY N/A 11/24/2016   Procedure: Right Heart Cath and Coronary/Graft Angiography;  Surgeon: Lennette Bihari, MD;  Location: [facility] INVASIVE CV LAB;  Service: Cardiovascular;  Laterality: N/A;  . RIGHT/LEFT HEART CATH AND CORONARY/GRAFT ANGIOGRAPHY N/A 10/18/2019   Procedure: RIGHT/LEFT HEART CATH AND CORONARY/GRAFT ANGIOGRAPHY;  Surgeon: Lennette Bihari, MD;  Location: [facility] INVASIVE CV LAB;  Service: Cardiovascular;  Laterality: N/A;  . TEE WITHOUT CARDIOVERSION N/A 11/14/2019   Procedure: TRANSESOPHAGEAL ECHOCARDIOGRAM (TEE);  Surgeon: Kathleene Hazel, MD;  Location: [facility] INVASIVE CV LAB;  Service: Open Heart Surgery;  Laterality: N/A;  . TONSILLECTOMY AND ADENOIDECTOMY    . TRANSCATHETER AORTIC VALVE REPLACEMENT, TRANSFEMORAL N/A 11/14/2019   Procedure: TRANSCATHETER AORTIC VALVE REPLACEMENT, TRANSFEMORAL;  Surgeon: Kathleene Hazel, MD;  Location: [facility] INVASIVE CV LAB;   Service: Open Heart Surgery;  Laterality: N/A;     Prior to Admission medications   Medication Sig Start Date End Date Taking? Authorizing Provider  albuterol (VENTOLIN HFA) 108 (90 Base) MCG/ACT inhaler INHALE TWO PUFFS EVERY 4-6 HOURS AS NEEDED FOR COUGH OR WHEEZE 10/30/19   Kozlow, Alvira Philips, MD  amLODipine (NORVASC) 2.5 MG tablet TAKE 1 TABLET BY MOUTH TWICE A DAY 04/18/20   Lennette Bihari, MD  apixaban (ELIQUIS) 5 MG TABS tablet Take 1 tablet (5 mg total) by mouth 2 (two) times daily. 11/21/19   Sheilah Pigeon, PA-C  atorvastatin (LIPITOR) 20 MG tablet Take 1 tablet (20 mg total) by mouth at bedtime. 03/16/18   Barnetta Chapel, MD  Azelastine-Fluticasone [phone] MCG/ACT SUSP use it only 3 days a week such as Monday Wednesday and Friday Patient not taking: Reported on 04/22/2020 03/28/20   Jessica Priest, MD  docusate sodium (COLACE) 100 MG capsule Take 100 mg by mouth daily as needed for mild constipation. PRN constipation    [provider]  ezetimibe (ZETIA) 10 MG tablet Take 1 tablet (10 mg total) by mouth daily. 12/27/19   Lennette Bihari, MD  fenofibrate (TRICOR) 145 MG tablet TAKE 0.5 TABLETS (72.5 MG  TOTAL) BY MOUTH DAILY. 02/20/20   Lennette Bihari, MD  fluticasone (FLONASE) 50 MCG/ACT nasal spray Place 2 sprays into both nostrils daily as needed for allergies or rhinitis.     [provider]  furosemide (LASIX) 20 MG tablet Take 1 tablet (20 mg total) by mouth daily. 04/22/20 07/21/20  Lennette Bihari, MD  ipratropium (ATROVENT) 0.06 % nasal spray Can use two sprays in each nostril every six hours as needed to dry up nose. 02/28/19   Kozlow, Alvira Philips, MD  isosorbide mononitrate (IMDUR) 60 MG 24 hr tablet TAKE 1 AND 1/2 TABS (90MG ) BY MOUTH EVERY MORNING AND 1/2 TAB EVERY EVENING 09/07/19   09/09/19, MD  levothyroxine (SYNTHROID) 25 MCG tablet Take 25 mcg by mouth daily before breakfast.  07/08/19   [provider]  loratadine (CLARITIN) 10 MG tablet Take  10 mg by mouth every evening.     [provider]  Multiple Vitamins-Minerals (CENTRUM SILVER 50+MEN) TABS Take 1 tablet by mouth at bedtime.    [provider]  nitroGLYCERIN (NITROLINGUAL) 0.4 MG/SPRAY spray PLACE 1 SPRAY UNDER THE TONGUE EVERY 5 (FIVE) MINUTES X 3 DOSES AS NEEDED FOR CHEST PAIN. 11/17/19   01/18/20, MD  Omega-3 Fatty Acids (FISH OIL) 1000 MG CAPS Take 1,000 mg by mouth 2 (two) times daily.     [provider]  pantoprazole (PROTONIX) 40 MG tablet TAKE 1 TABLET BY MOUTH EVERY DAY 04/18/20   Kozlow, 14/9/21, MD  ranolazine (RANEXA) 1000 MG SR tablet Take 1 tablet (1,000 mg total) by mouth 2 (two) times daily. 04/22/20   04/24/20, MD  tamsulosin (FLOMAX) 0.4 MG CAPS capsule Take 1 capsule by mouth daily. 02/23/20   [provider]  vitamin C (ASCORBIC ACID) 500 MG tablet Take 500 mg by mouth at bedtime.     [provider]  vitamin E 400 UNIT capsule Take 400 Units by mouth daily.    [provider]     Allergies Altace [ramipril], Mucinex [guaifenesin er], and Contrast media [iodinated diagnostic agents]   Family History  Problem Relation Age of Onset  . Cancer Mother   . Heart disease Father        died age 87  . Stroke Maternal Grandmother   . Cancer Maternal Grandfather   . Allergic rhinitis Neg Hx   . Angioedema Neg Hx   . Asthma Neg Hx   . Atopy Neg Hx   . Eczema Neg Hx   . Immunodeficiency Neg Hx   . Urticaria Neg Hx     Social History Social History   Tobacco Use  . Smoking status: Never Smoker  . Smokeless tobacco: Never Used  Vaping Use  . Vaping Use: Never used  Substance Use Topics  . Alcohol use: No    Alcohol/week: 0.0 standard drinks  . Drug use: No    Review of Systems  Constitutional:   No fever or chills.  ENT:   No sore throat. No rhinorrhea. Cardiovascular:   No chest pain or syncope. Respiratory:   No dyspnea or cough. Gastrointestinal:   Negative for  abdominal pain, vomiting and diarrhea.  Musculoskeletal:   Positive left chest wall pain.  Recent laceration of left hand which is been sutured and is healing appropriately and he is compliant with Keflex given to him by his doctor All other systems reviewed and are negative except as documented above in ROS and HPI.  ____________________________________________  PHYSICAL EXAM:  VITAL SIGNS: ED Triage Vitals  Enc Vitals Group     BP 06/03/20 2113 135/77     Pulse Rate 06/03/20 2113 81     Resp 06/03/20 2113 20     Temp 06/03/20 2113 98.2 F (36.8 C)     Temp Source 06/03/20 2113 Oral     SpO2 06/03/20 2113 97 %     Weight 06/03/20 2114 183 lb (83 kg)     Height 06/03/20 2114 5\' 8"  (1.727 m)     Head Circumference --      Peak Flow --      Pain Score 06/03/20 2114 8     Pain Loc --      Pain Edu? --      Excl. in GC? --     Vital signs reviewed, nursing assessments reviewed.   Constitutional:   Alert and oriented. Non-toxic appearance. Eyes:   Conjunctivae are normal. EOMI. PERRL. ENT      Head:   Normocephalic and atraumatic.      Nose:   Wearing a mask.      Mouth/Throat:   Wearing a mask.      Neck:   No meningismus. Full ROM.  No C-spine tenderness  Cardiovascular:   RRR. Symmetric bilateral radial and DP pulses.  No murmurs. Cap refill less than 2 seconds. Respiratory:   Normal respiratory effort without tachypnea/retractions. Breath sounds are clear and equal bilaterally. No wheezes/rales/rhonchi. Gastrointestinal:   Soft and nontender. Non distended. There is no CVA tenderness.  No rebound, rigidity, or guarding.  Musculoskeletal:   Normal range of motion in all extremities. No joint effusions.  No lower extremity tenderness.  No edema. Neurologic:   Normal speech and language.  Motor grossly intact. No acute focal neurologic deficits are appreciated.  Skin:    Skin is warm, dry and intact. No rash noted.  No petechiae, purpura, or  bullae.  ____________________________________________    LABS (pertinent positives/negatives) (all labs ordered are listed, but only abnormal results are displayed) Labs Reviewed - No data to display ____________________________________________  ____________________________________________    RADIOLOGY  DG Chest 2 View  Result Date: 06/03/2020 CLINICAL DATA:  85 year old male with left chest wall pain after fall. EXAM: CHEST - 2 VIEW COMPARISON:  Chest radiograph dated 11/21/2019 FINDINGS: Small left pleural effusion and left lung base atelectasis. The right lung is clear. No pneumothorax. Stable cardiac silhouette. Median sternotomy wires and CABG vascular clips. Aortic valve repair. Left pectoral pacemaker device. Artifact versus possible minimally displaced fracture of the lateral left fifth rib. Dedicated rib radiograph recommended. Chronic lower thoracic compression fractures. IMPRESSION: 1. Small left pleural effusion and left lung base atelectasis. 2. Possible minimally displaced fracture of the lateral left fifth rib. Electronically Signed   By: 11/23/2019 M.D.   On: 06/03/2020 22:33    ____________________________________________   PROCEDURES Procedures  ____________________________________________    CLINICAL IMPRESSION / ASSESSMENT AND PLAN / ED COURSE  Medications ordered in the ED: Medications - No data to display  Pertinent labs & imaging results that were available during my care of the patient were reviewed by me and considered in my medical decision making (see chart for details).  Jorge Cherry was evaluated in Emergency Department on 06/03/2020 for the symptoms described in the history of present illness. He was evaluated in the context of the global COVID-19 pandemic, which necessitated consideration that the patient might be at risk for infection with the SARS-CoV-2 virus  that causes COVID-19. Institutional protocols and algorithms that pertain to the  evaluation of patients at risk for COVID-19 are in a state of rapid change based on information released by regulatory bodies including the CDC and federal and state organizations. These policies and algorithms were followed during the patient's care in the ED.   Patient presents with 2 nondisplaced rib fractures on the left with a small left pleural effusion on x-ray in the setting of Eliquis use.  This raises concern for traumatic hemothorax.  Vital signs are normal, oxygenation is normal, breathing is unlabored.  Will obtain CT scan of the chest, if this appears to be simple fluid he can be discharged home.  If it is shown to be hemothorax in the setting of Eliquis use and rib fractures, would plan to observe overnight to ensure stable respiratory function.      ____________________________________________   FINAL CLINICAL IMPRESSION(S) / ED DIAGNOSES    Final diagnoses:  Closed traumatic nondisplaced fracture of two ribs of left side, initial encounter     ED Discharge Orders    None      Portions of this note were generated with dragon dictation software. Dictation errors may occur despite best attempts at proofreading.   Sharman CheekStafford, Padraic Marinos, MD 06/03/20 [phone]

## 2020-06-03 NOTE — Discharge Instructions (Addendum)
Use Tylenol for pain and fevers.  Up to 1000 mg per dose, up to 4 times per day.  Do not take more than 4000 mg of Tylenol/acetaminophen within 24 hours..  You are also being discharged with a prescription for lidocaine patches.  Apply patches directly to the site pain. Lidocaine patches should be kept on for 12 hours, and then remove for 12 hours before applying the next one.  Alternating on/off every 12 hours.  Do not leave the lidocaine patch on for more than 12 hours, do not apply multiple patches.  If you develop fevers, productive cough, uncontrolled pain, please return to the ED.  Otherwise, please follow-up with your primary care physician this week

## 2020-06-04 DIAGNOSIS — S2242XA Multiple fractures of ribs, left side, initial encounter for closed fracture: Secondary | ICD-10-CM | POA: Diagnosis not present

## 2020-06-04 MED ORDER — ACETAMINOPHEN 500 MG PO TABS
1000.0000 mg | ORAL_TABLET | Freq: Once | ORAL | Status: AC
  Administered 2020-06-04: 1000 mg via ORAL
  Filled 2020-06-04: qty 2

## 2020-06-04 MED ORDER — LIDOCAINE 5 % EX PTCH: 1.0000 | MEDICATED_PATCH | Freq: Two times a day (BID) | CUTANEOUS | 0 refills | Status: DC

## 2020-06-04 MED ORDER — LIDOCAINE 5 % EX PTCH
1.0000 | MEDICATED_PATCH | Freq: Once | CUTANEOUS | Status: DC
  Administered 2020-06-04: 1 via TRANSDERMAL
  Filled 2020-06-04: qty 1

## 2020-06-04 NOTE — ED Provider Notes (Signed)
  Patient received in signout from Dr. Scotty Court pending CT chest for evaluation of hemothorax.  CT reviewed with for left-sided closed rib fractures, no hemothorax or flail chest.  Patient continues to be comfortable with controlled pain, without tachycardia or hypoxia.  I returned to the bedside and discussed this with the patient and his wife.  We discussed incentive spirometry use, we discussed nonnarcotic multimodal pain control.  We discussed following up with his PCP and we discussed return precautions for the ED.  Patient medically stable for discharge home.   Delton Prairie, MD 06/04/20 [phone]

## 2020-06-06 NOTE — Progress Notes (Signed)
Remote pacemaker transmission.   

## 2020-06-24 ENCOUNTER — Telehealth: Payer: Self-pay | Admitting: Cardiovascular Disease

## 2020-06-24 ENCOUNTER — Other Ambulatory Visit: Payer: Self-pay | Admitting: Physician Assistant

## 2020-06-24 NOTE — Telephone Encounter (Signed)
Patient has not been taking the 1000 mg, only the 500 mg- visit in December states since he was taken off Amiodarone to increase Renexa. Would you like to him to go ahead and start the 1000 mg now, or stay on the 500 mg since he has been taking this since December.  Will route to MD to advise.

## 2020-06-24 NOTE — Telephone Encounter (Signed)
Pt c/o medication issue:  1. Name of Medication: ranolazine (RANEXA) 1000 MG SR tablet  2. How are you currently taking this medication (dosage and times per day)? Has been taking 1 500 MG tablet 2x daily   3. Are you having a reaction (difficulty breathing--STAT)? No  4. What is your medication issue? Jorge Cherry is calling stating they were not aware Dr. Tresa Endo increased this medication at his appointment in December so he has still been taking 500 MG tablets instead of 1000 MG's. When they picked up this prescription for 1000 MG's Joevanni cut it in half to continue taking 500 MG tablets. After realizing the bottle advised otherwise Jorge Cherry called the pharmacy and they advised he should not take the broken tablets so they are wanting to know what they should do in regards to this. Please advise.

## 2020-06-25 NOTE — Telephone Encounter (Signed)
Pt last saw Dr Tresa Endo 04/22/20, last labs 02/05/20 Creat 1.43, age 85, weight 83kg, based on specified criteria pt is on appropriate dosage of Eliquis 5mg  BID.  Will refill rx.

## 2020-06-27 ENCOUNTER — Telehealth: Payer: Self-pay | Admitting: Physician Assistant

## 2020-06-27 NOTE — Telephone Encounter (Signed)
Per patient's wife, patient cut the 1000mg  tablet of Ranexa into quarter tablets instead. His pharmacy told him he cannot cut the slow release Ranexa into quarters. If that is the case, he will need a new Rx which may not be covered by his insurance. He wanted to double check with cardiology service.  Unfortunately, I am not entirely sure either. Will forward to our pharmacist and triage team to review tomorrow since I will be off tomorrow. If it cannot be cut, then a new Rx of Ranexa will be sent to his pharmacy

## 2020-06-27 NOTE — Telephone Encounter (Signed)
Now that he is off amiodarone, increase ranexa to 1000 mf bid

## 2020-06-28 MED ORDER — RANOLAZINE ER 1000 MG PO TB12: 1000.0000 mg | ORAL_TABLET | Freq: Two times a day (BID) | ORAL | 3 refills | Status: DC

## 2020-06-28 NOTE — Telephone Encounter (Signed)
Spoke with patient's wife. Explained that Dr. Tresa Endo advised Jemar take ranexa 1000mg  BID per 2/14 note. Rx(s) sent to pharmacy electronically.

## 2020-06-28 NOTE — Telephone Encounter (Signed)
Ranexa tablets should not be cut, it's only available as an extended release 12 hour tablet and the lowest mg strength it comes in is a 500mg  tablet which needs to be taken twice daily. If his insurance is covering the 1000mg  tablets, they should also cover the 500mg  tablets.   Per phone note from 2/14, Dr advised that pt should be taking 1000mg  BID of Ranexa, so will need to clarify with pt why he has been taking 250mg  BID.

## 2020-06-28 NOTE — Telephone Encounter (Signed)
Called patient wife, regarding message from [employer]-  Wife verbalized understanding.

## 2020-07-01 ENCOUNTER — Telehealth: Payer: Self-pay | Admitting: Cardiovascular Disease

## 2020-07-01 NOTE — Telephone Encounter (Signed)
Jorge Cherry and Misty Stanley,  I am not sure why I am on the list, but as best as I can tell, his rhythm is now considered permanent, and the ranolazine started for chest pain I would be inclined to decrease to 500 bid and see how he fares, and then a decsion could be made later as to whether to stop altogether

## 2020-07-01 NOTE — Telephone Encounter (Signed)
STAT if patient feels like he/she is going to faint   1) Are you dizzy now? No   2) Do you feel faint or have you passed out? No   3) Do you have any other symptoms? Shakes, nausea, low energy   4) Have you checked your HR and BP (record if available)?  Patient's wife states she does not have an exact reading, but the patient's BP was around 170/80. HR is normal. No reading available.  Pt c/o BP issue: STAT if pt c/o blurred vision, one-sided weakness or slurred speech  1. What are your last 5 BP readings?   Patient's wife states she does not have an exact reading, but the patient's BP was around 170/80.  2. Are you having any other symptoms (ex. Dizziness, headache, blurred vision, passed out)? Dizziness, shakes, nausea, low energy  3. What is your BP issue?   Patient's wife states the patient got up to use the restroom this morning and he became extremely dizzy. She states when he came back he was shaking and nauseous.  She states the patient placed his hand on his forehead to see if he had a temperature and his forehead felt a little warm, but he also had a cold spot on the back of his head. They called 911 and when EMS arrived they did the stroke regimen and ruled that out. When the patient's BP was initially taken it was elevated, but it eventually came back down. Patient's wife states EMS recommended contacting our office for advisement. Patient's wife would like to know what patient needs to do next.  She states he has not eaten or taken his medication yet. Please advise.

## 2020-07-01 NOTE — Telephone Encounter (Signed)
Ranexa can cause dizziness or nausea/vomiting in some patients, but patient recovered from morning episode. Looks like patient needs recommendation from MD/APP.

## 2020-07-01 NOTE — Telephone Encounter (Signed)
Manual transmission received.  Normal device function.  Presenting rhythm is AF with controlled V rates.   Normal device function.

## 2020-07-01 NOTE — Telephone Encounter (Signed)
Returned the call to the patient and his wife. He stated that this morning when he woke up he had a ringing in his ear, nausea and dizziness. They called EMS and a stroke was ruled out. They were advised to call the cardiologist office.  The patient stated that he has had a ringing in the ear since starting the Ranexa and after increasing it to 1000 mg bid on 2/18 that it has gotten worse.   His blood pressures today without any medications: 120/70 71 119/79 86 118/79 82  He stated that he is feeling fine now and has been able to eat and drink. He denies any current symptoms.   He has been advised to do a transmission to see if anything abnormal can be seen.

## 2020-07-02 ENCOUNTER — Other Ambulatory Visit: Payer: Self-pay | Admitting: Cardiovascular Disease

## 2020-07-02 ENCOUNTER — Telehealth: Payer: Self-pay | Admitting: Cardiovascular Disease

## 2020-07-02 ENCOUNTER — Encounter: Payer: Self-pay | Admitting: Cardiovascular Disease

## 2020-07-02 MED ORDER — RANOLAZINE ER 500 MG PO TB12: 500.0000 mg | ORAL_TABLET | Freq: Two times a day (BID) | ORAL | 6 refills | Status: DC

## 2020-07-02 NOTE — Telephone Encounter (Signed)
Please see separate note-patient aware.

## 2020-07-02 NOTE — Telephone Encounter (Signed)
Error, not needed

## 2020-07-02 NOTE — Telephone Encounter (Signed)
Patient's wife following up. She states the pharmacy said that Dr. Tresa Endo did not authorize this but Dr. Graciela Husbands has recommended him go back to 500mg .

## 2020-07-02 NOTE — Telephone Encounter (Signed)
Spoke to patient's wife she stated she wanted Dr.Kelly to know husband cannot take Ranexa 1000 mg.Stated makes him dizzy,nauseated and his hearing is worse.Stated she had to 911 yesterday.Stated she spoke to Dr.Klein and he decreased to 500 mg twice a day.He started taking 500 mg this morning and he has not had any dizziness.Advised Dr.Kelly is out of office today.I will send message to him.

## 2020-07-02 NOTE — Telephone Encounter (Signed)
Patient's wife is following up regarding Dr. Odessa Fleming advisement for the patient to decrease Ranolazine from 1000 MG to 500 MG, due to medication reaction (see 07/01/20 encounter). She is requesting to have a supply of medication sent to their local pharmacy until Dr. Tresa Endo is able to advise.  *STAT* If patient is at the pharmacy, call can be transferred to refill team.   1. Which medications need to be refilled? (please list name of each medication and dose if known)  ranolazine (RANEXA) 500 MG tablet  2. Which pharmacy/location (including street and city if local pharmacy) is medication to be sent to? [id number] - [place], [place] - [address]  3. Do they need a 30 day or 90 day supply? 30 day supply

## 2020-07-08 NOTE — Telephone Encounter (Signed)
agree

## 2020-07-09 DIAGNOSIS — G4733 Obstructive sleep apnea (adult) (pediatric): Secondary | ICD-10-CM | POA: Diagnosis not present

## 2020-07-09 NOTE — Telephone Encounter (Signed)
Spoke with wife and informed Dr. Tresa Endo is agreeable to Dr. Odessa Fleming plan. Wife state pt is feeling much better but experiencing constant ringing in the ear that she feels is related to the medication. Wife state they reviewed side effects and ringing in the ear was listed.  Appointment scheduled for 3/3 for further evaluations.

## 2020-07-10 ENCOUNTER — Other Ambulatory Visit: Payer: Self-pay | Admitting: Urology

## 2020-07-10 ENCOUNTER — Other Ambulatory Visit (HOSPITAL_COMMUNITY): Payer: Self-pay | Admitting: Urology

## 2020-07-10 DIAGNOSIS — N281 Cyst of kidney, acquired: Secondary | ICD-10-CM

## 2020-07-11 ENCOUNTER — Ambulatory Visit: Payer: Medicare PPO | Admitting: Cardiovascular Disease

## 2020-07-11 ENCOUNTER — Other Ambulatory Visit: Payer: Self-pay

## 2020-07-11 VITALS — BP 150/80 | Ht 68.0 in | Wt 181.6 lb

## 2020-07-11 DIAGNOSIS — I25700 Atherosclerosis of coronary artery bypass graft(s), unspecified, with unstable angina pectoris: Secondary | ICD-10-CM | POA: Diagnosis not present

## 2020-07-11 DIAGNOSIS — I4819 Other persistent atrial fibrillation: Secondary | ICD-10-CM | POA: Diagnosis not present

## 2020-07-11 DIAGNOSIS — Z950 Presence of cardiac pacemaker: Secondary | ICD-10-CM | POA: Diagnosis not present

## 2020-07-11 DIAGNOSIS — N401 Enlarged prostate with lower urinary tract symptoms: Secondary | ICD-10-CM

## 2020-07-11 DIAGNOSIS — Z952 Presence of prosthetic heart valve: Secondary | ICD-10-CM

## 2020-07-11 DIAGNOSIS — G4733 Obstructive sleep apnea (adult) (pediatric): Secondary | ICD-10-CM | POA: Diagnosis not present

## 2020-07-11 DIAGNOSIS — E785 Hyperlipidemia, unspecified: Secondary | ICD-10-CM

## 2020-07-11 MED ORDER — METOPROLOL SUCCINATE ER 50 MG PO TB24: 25.0000 mg | ORAL_TABLET | Freq: Every day | ORAL | 1 refills | Status: DC

## 2020-07-11 NOTE — Patient Instructions (Signed)
Medication Instructions:  Start Metoprolol Succinate 12.5 mg daily   *If you need a refill on your cardiac medications before your next appointment, please call your pharmacy*   Follow-Up: At [facility], you and your health needs are our priority.  As part of our continuing mission to provide you with exceptional heart care, we have created designated Provider Care Teams.  These Care Teams include your primary Cardiologist (physician) and Advanced Practice Providers (APPs -  Physician Assistants and Nurse Practitioners) who all work together to provide you with the care you need, when you need it.  We recommend signing up for the patient portal called "MyChart".  Sign up information is provided on this After Visit Summary.  MyChart is used to connect with patients for Virtual Visits (Telemedicine).  Patients are able to view lab/test results, encounter notes, upcoming appointments, etc.  Non-urgent messages can be sent to your provider as well.   To learn more about what you can do with MyChart, go to [email or web address].    Your next appointment:   3 month(s)  The format for your next appointment:   In Person  Provider:   Nicki Guadalajara, MD

## 2020-07-11 NOTE — Progress Notes (Signed)
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 (500 mg total) by mouth 2 (two) times daily. 60 tablet 6  . tamsulosin (FLOMAX) 0.4 MG CAPS capsule Take 1 capsule by mouth daily.    . vitamin C (ASCORBIC ACID) 500 MG tablet Take 500 mg by mouth at bedtime.     . vitamin E 400 UNIT capsule Take 400 Units by mouth daily.     No current facility-administered medications for this visit.    Socially he is married and has 2 children and 4 grandchildren. There is no tobacco alcohol use. Recently he was has been very active.  ROS General: Negative; No fevers, chills, or night sweats;  HEENT: He is hard of hearing; A new complaint is that of ringing in his ears; no visual changes, sinus congestion, difficulty swallowing Pulmonary: Negative; No cough, wheezing, shortness of breath, hemoptysis Cardiovascular: See HPI GI: Positive for recent GI bleed secondary to diverticular disease GU: Negative; No dysuria, hematuria, or difficulty voiding Musculoskeletal: Negative; no myalgias, joint pain, or weakness Hematologic/Oncology: Negative; no easy bruising, bleeding Endocrine: Negative; no heat/cold intolerance; no diabetes Neuro: Negative; no changes in balance, headaches Skin: Negative; No rashes or skin lesions Psychiatric: Negative;  No behavioral problems, depression Sleep: Positive for OSA with history of snoring, daytime sleepiness, no bruxism, restless legs, hypnogognic hallucinations, no cataplexy Other comprehensive 14 point system review is negative.  PE BP (!) 150/80   Ht _0  (1.727 m)   Wt 181 lb 9.6 oz (82.4 kg)   BMI 27.61 kg/m    Repeat blood pressure by me was 144/78  Wt Readings from Last 3 Encounters:  07/11/20 181 lb 9.6 oz (82.4 kg)  06/03/20 183 lb (83 kg)  04/22/20 192 lb 12.8 oz (87.5 kg)    General: Alert, oriented, no distress.  Skin: normal turgor, no rashes, warm and dry HEENT: Normocephalic, atraumatic. Pupils equal round and reactive to light; sclera anicteric; extraocular muscles intact; Nose without nasal septal hypertrophy Mouth/Parynx benign; Mallinpatti scale 3 Neck: No JVD, no carotid bruits; normal carotid upstroke Lungs: clear to ausculatation and percussion; no wheezing or rales Chest wall: without tenderness to palpitation Heart: PMI not displaced, irregularly irregular with a controlled atrial fibrillation response, s1 s2 normal, 8-8/8 systolic murmur, no diastolic murmur,  no rubs, gallops, thrills, or heaves Abdomen: soft, nontender; no hepatosplenomehaly, BS+; abdominal aorta nontender and not dilated by palpation. Back: no CVA tenderness Pulses 2+ Musculoskeletal: full range of motion, normal strength, no joint deformities Extremities: no clubbing cyanosis or edema, Homan's sign negative  Neurologic: grossly nonfocal; Cranial nerves grossly wnl Psychologic: Normal mood and affect    ECG (independently read by me): Atrial fibrillation at 64, QTc 480 msec  ECG (independently read by me): Atrial fibrillation at 62 with a competing junctional pacemaker, Left axis deviation, QTc 460 msec  March 2020 ECG (independently read by me): Sinus rhythm at 71 bpm.  First-degree block with 212 ms.  Right bundle branch block with repolarization changes.  Left anterior  hemiblock.  January 26, 2018 ECG (independently read by me): Sinus bradycardia at 47 bpm with first-degree AV block.  PR interval 218 ms.  LVH with QRS widening.  Left axis deviation.  October 28, 2017 ECG (independently read by me): Atrial fibrillation with variable rate that seems to be more in the 50s to 60s but following a pause drops into the 40s  August 26, 2017 ECG (independently read by me): atrial fibrillation at 56 bpm.  Nonspecific ST changes.  QTc interval 476 ms.  February 2019 ECG (independently read by me): Atrial fibrillation at 71 bpm.  LVH by voltage in aVL.  Nonspecific T changes.  04/27/2017 ECG (independently read by me): Normal sinus rhythm at 60 bpm.  Nonspecific intraventricular block.  Anterior T-wave abnormality  03/23/2017 ECG (independently read by me): atrial fibrillation at 64 bpm.  Nonspecific interventricular block.  Nonspecific T wave abnormality.  02/17/2017 ECG (independently read by me): Atrial fibrillation at 72 bpm.  RV conduction delay.  QTc interval 453 ms.  01/29/2017 ECG (independently read by me): Atrial fibrillation at 56 bpm.  LVH by voltage criteria.  Nonspecific ST changes.  QTc interval 430 ms.  August 2018 ECG (independently read by me): Sinus bradycardia 59 bpm.  LVH by voltage.  QTc interval 469 ms.  No significant ST-T changes.  April 2018 ECG (independently read by me): Sinus rhythm at 61 bpm.  RV conduction delay.  LVH.  March 2018 ECG (independently read by me): Atrial fibrillation at 71 bpm.  RV conduction delay.  QTc interval 456 ms.  Left axis deviation.  February 2018 ECG (independently read by me): Normal sinus rhythm at 60 bpm.  LVH.  QTc interval at 464 ms.  Nonspecific T abnormality  December 2017 ECG (independently read by me):  Sinus bradycardia 57 bpm.  LVH by voltage. No significant ST changes.  January 10, 2016 ECG (independently read by me): Normal sinus rhythm at 64 bpm.  LVH.  QTc interval 468 ms.  December 2016 ECG  (independently read by me):  Sinus bradycardia 51 bpm.  No ectopy. QTc interval 429 ms.  LVH by voltage criteria in aVL.  September 2016 ECG (independently read by me):  Sinus bradycardia with first-degree AV block. RV conduction delay. No significant ST-T changes.   August 2016 ECG (independently read by me): Sinus bradycardia 57 bpm.  Mild RV conduction delay.  May 2016 ECG (independently read by me): Sinus bradycardia at 49 bpm with first-degree AV block with a PR interval 218 ms.  Right reticular conduction delay.  December 2015 ECG (independently read by me).  For these: Sinus bradycardia 57 bpm.  First-degree AV block with a PR interval at 224 ms.  No significant ST-T changes.  March 2015 ECG (independently read by me):  Sinus rhythm at 59 beats per minute. Mild LVH by voltage criteria in aVL. Normal intervals.  01/05/2013 ECG: Normal sinus rhythm at 62. Mild RV conduction delay. PR interval 204 ms, QTc interval 452 ms.  LABS: BMP Latest Ref Rng & Units 02/05/2020 01/03/2020 12/11/2019  Glucose 70 - 99 mg/dL 94 97 98  BUN 8 - 23 mg/dL _0 Creatinine 0.61 - 1.24 mg/dL 1.43(H) 1.36(H) 1.39(H)  BUN/Creat Ratio 10 - 24 - 14 -  Sodium 135 - 145 mmol/L 140 143 141  Potassium 3.5 - 5.1 mmol/L 3.8 3.9 4.0  Chloride 98 - 111 mmol/L 112(H) 108(H) 109  CO2 22 - 32 mmol/L _1 Calcium 8.9 - 10.3 mg/dL 9.0 8.8 8.9   Hepatic Function Latest Ref Rng & Units 11/10/2019 03/14/2018 03/13/2018  Total Protein 6.5 - 8.1 g/dL 5.8(L) - -  Albumin 3.5 - 5.0 g/dL 3.3(L) 2.2(L) 2.4(L)  AST 15 - 41 U/L 41 - -  ALT 0 - 44 U/L 50(H) - -  Alk Phosphatase 38 - 126 U/L 45 - -  Total Bilirubin 0.3 - 1.2 mg/dL 0.9 - -  Bilirubin, Direct 0.1 - 0.5 mg/dL - - -   CBC Latest Ref Rng & Units 02/05/2020 01/10/2020 12/11/2019  WBC 4.0 - 10.5 K/uL 6.4 5.7 5.1  Hemoglobin 13.0 - 17.0 g/dL 12.5(L) 11.6(L) 11.1(L)  Hematocrit 39.0 - 52.0 % 40.7 37.1(L) 35.9(L)  Platelets 150 - 400 K/uL 156 141(L) 122(L)   Lab Results   Component Value Date   MCV 97.1 02/05/2020   MCV 96.4 01/10/2020   MCV 97.3 12/11/2019   Lab Results  Component Value Date   TSH 1.744 11/20/2019   Lab Results  Component Value Date   HGBA1C 5.5 11/10/2019     Lipid Panel     Component Value Date/Time   CHOL 106 07/01/2016 1345   CHOL 137 04/09/2014 1115   TRIG 120 07/01/2016 1345   TRIG 182 (H) 04/09/2014 1115   HDL 35 (L) 07/01/2016 1345   HDL 32 (L) 04/09/2014 1115   CHOLHDL 3.0 07/01/2016 1345   VLDL 24 07/01/2016 1345   LDLCALC 47 07/01/2016 1345   LDLCALC 69 04/09/2014 1115     RADIOLOGY: No results found.  A CPAP download was obtained in the office with reference to his obstructive sleep apnea from August 18 through January 24, 2018. He is 100% compliant with usage days and averaging 6 hours and 35 minutes of usage daily.  At his 12 cm pressure, AHI is excellent at 0.9.  He does have a mask leak.  CPAP November 10 through April 18, 2018: Compliance 97% usage days and usage greater than 4 hours.  Average use 6 hours 46 minutes.  At 12 cm pressure, AHI 1.2/h.  CPAP download June 25, 2018 through July 24, 2018.  Usage 87%, usage greater than 4 hours 83%, average usage on days used 6 hours and 34 minutes.  AHI 1.2 8012 cm pressure.  IMPRESSION:  1. Persistent atrial fibrillation ([place])   2. Coronary artery disease involving coronary bypass graft of native heart with unstable angina pectoris ([place])   3. S/P TAVR (transcatheter aortic valve replacement)   4. OSA (obstructive sleep apnea)   5. Pacemaker   6. Hyperlipidemia LDL goal <70   7. Benign prostatic hyperplasia with lower urinary tract symptoms, symptom details unspecified     ASSESSMENT AND PLAN: Jorge Cherry is an 85 year old  male who is status post CABG revascularization  surgery in 1994.  Subsequently he underwent DES stenting to the graft supplying the diagonal vessel in 2003.  In July 2017 he was hospitalized with atrial fibrillation in the  setting of bradycardia and hypotension leading to medication adjustment  An echo Doppler study of 11/21/2015 showed an EF of 55-60%, mild aortic stenosis with a valve area of 1.75 mm per there was mild pulmonary hypertension. Subsequently he had complained of experiencing some episodes of chest tightness and his symptoms improved with further titration of nitrates as well as beta blocker therapy.  He developed significant anemia, which undoubtedly exacerbated his anginal symptomatology.  At catheterization in July 2018 although his LIMA to mid LAD and vein graft to diagonal vessel are patent the very proximal LAD had a 95% stenosis which supplies a diagonal vessel, which was not supplied by the grafts.  His native RCA and graft to RCA was occluded and there is some collateralization to the distal RCA via the vein graft supplying the circumflex marginal vessel.  His anginal symptoms have improved with stabilization of his hemoglobin.  Spironolactone was discontinued secondary to renal insufficiency.  For 3 years he was treated with medication for his class II anginal symptomatology.  However in June 2021 anginal symptoms progressed.  At that time repeat catheterization revealed similar CAD but progressive aortic valve stenosis.  He underwent successful TAVR with a 26 mm sapient 3 ultra TH VV the transfemoral approach on November 14, 2019.  Subsequently he developed several syncopal spells was found to have prolonged pauses of 8 and 11 seconds for which she required insertion of a dual-chamber [employer]onic permanent pacemaker by Dr. Caryl Comes on November 20, 2019.  He had had recurrent episodes of atrial fibrillation and despite cardioversion return back to atrial fibrillation.  He is now in persistent long-term atrial fibrillation.  Amiodarone was discontinued by me at his last office visit.  Since he had concomitant CAD I attempted to further titrate Ranexa to 1000 twice daily since he was no longer on amiodarone but apparently he  believes that this may have contributed to some episodes of being nausea and possibly ringing in his ears.  As result he is only been taking 500 mg twice daily as previously recommended.  His blood pressure today is increased at 150/80.  He is in atrial fibrillation.  I have elected to try adding low-dose metoprolol succinate 12.5 mg daily particularly since he is no longer on amiodarone which will be helpful both for rate control and potential anti-ischemic benefit.  He is undergoing remote pacemaker checks by Dr. Caryl Comes and has normal device function.  He continues to be on Zetia 10 mg, fenofibrate 145 mg and atorvastatin 20 mg for hyperlipidemia.  He has had issues with ringing in his ears and hearing difficulty has progressed.  He will undergo follow-up ENT evaluation with Dr. Wilburn Cornelia.  He is also undergoing urologic evaluation and will be undergoing a renal ultrasound due to urinary drip.  He continues to use CPAP.  On his most recent download which I obtained in the office today he is set at a 12 cm water pressure.  AHI is 6.6.  I will recommend changing him from a 12 cm set pressure to auto pressure with a range of 11 to 16 cm of water.  [employer] is his DME company.  I will see him in 3 months for follow-up evaluation   Troy Sine, MD, [facility]  07/13/2020 10:51 AM

## 2020-07-13 ENCOUNTER — Encounter: Payer: Self-pay | Admitting: Cardiovascular Disease

## 2020-07-20 ENCOUNTER — Other Ambulatory Visit: Payer: Self-pay | Admitting: Cardiovascular Disease

## 2020-08-05 ENCOUNTER — Other Ambulatory Visit: Payer: Self-pay | Admitting: Cardiovascular Disease

## 2020-08-05 DIAGNOSIS — I25708 Atherosclerosis of coronary artery bypass graft(s), unspecified, with other forms of angina pectoris: Secondary | ICD-10-CM

## 2020-08-07 ENCOUNTER — Ambulatory Visit (HOSPITAL_COMMUNITY)
Admission: RE | Admit: 2020-08-07 | Discharge: 2020-08-07 | Disposition: A | Discharge status: Home / Self Care | Payer: Medicare PPO | Source: Ambulatory Visit | Attending: Urology | Admitting: Urology

## 2020-08-07 ENCOUNTER — Other Ambulatory Visit: Payer: Self-pay

## 2020-08-07 DIAGNOSIS — N281 Cyst of kidney, acquired: Secondary | ICD-10-CM | POA: Insufficient documentation

## 2020-08-07 IMAGING — US US RENAL
1 series · 13 of 25 positions shown · non-contrast
Comparison: MRI abdomen [DATE].  Ultrasound [DATE].

CLINICAL DATA: Renal cyst.

EXAM:
RENAL / URINARY TRACT ULTRASOUND COMPLETE

[Series 1: us renal · 13 of 71 slices shown]
[im 1/71]
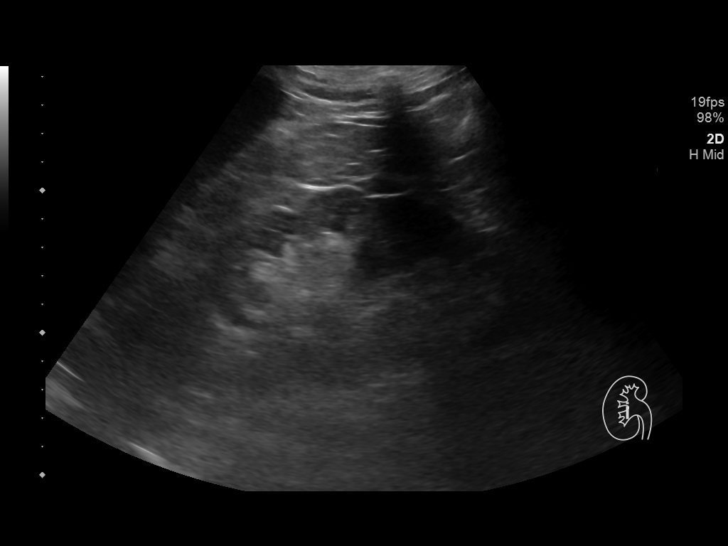
[im 6/71]
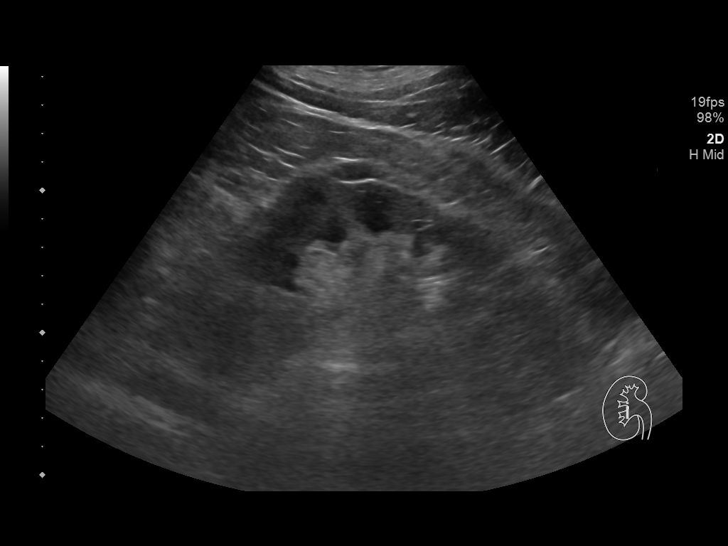
[im 12/71]
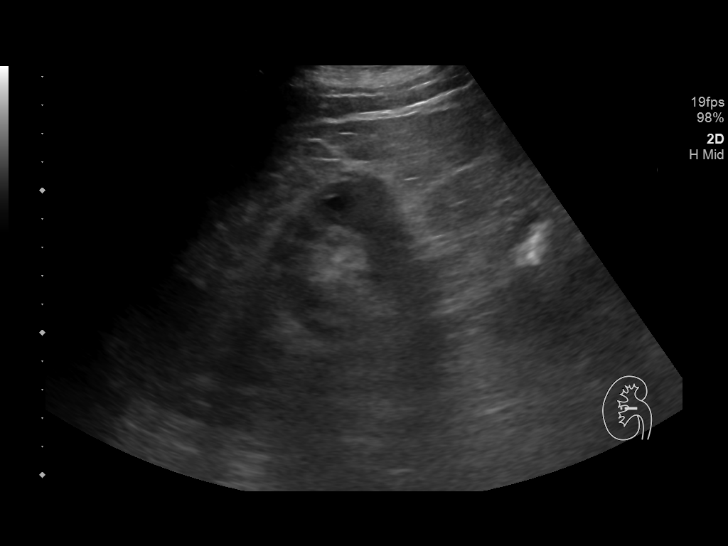
[im 18/71]
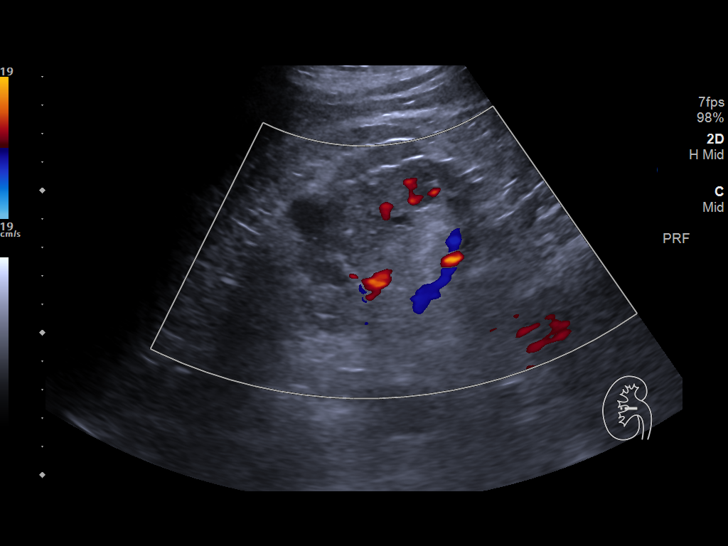
[im 24/71]
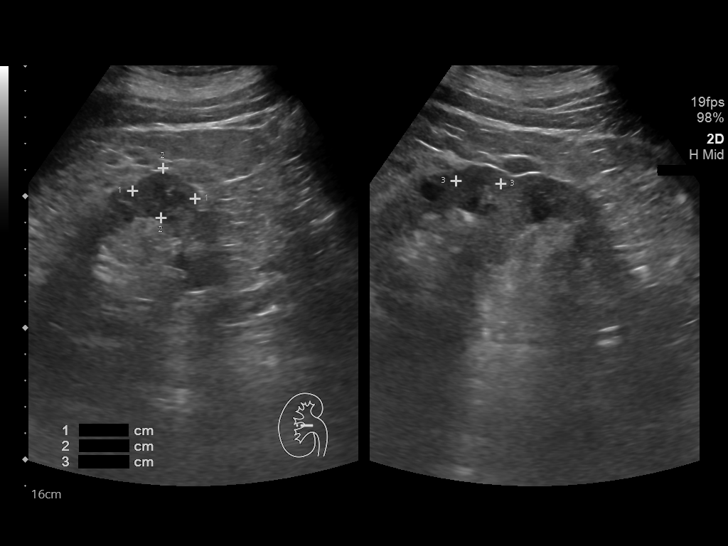
[im 30/71]
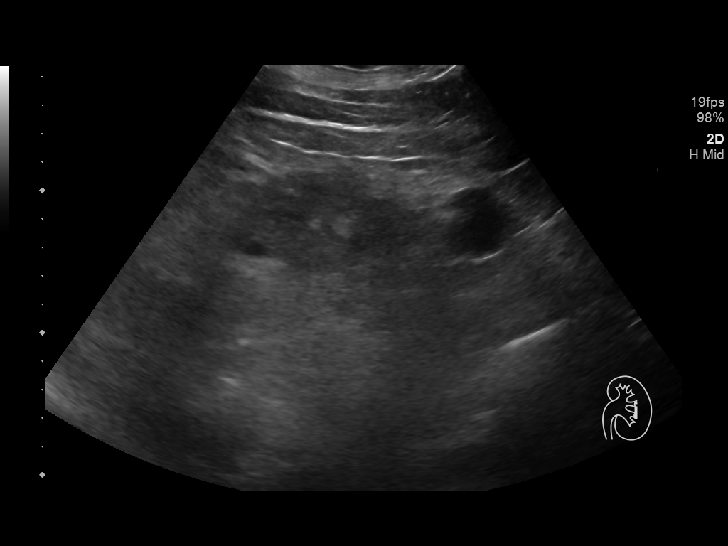
[im 36/71]
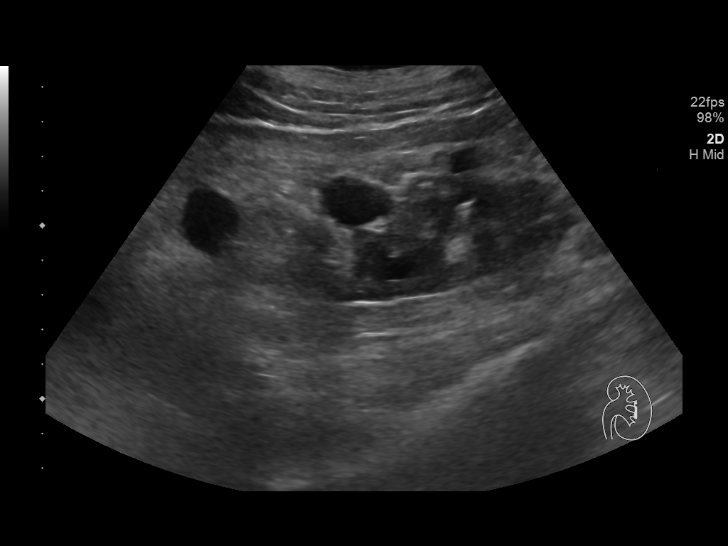
[im 41/71]
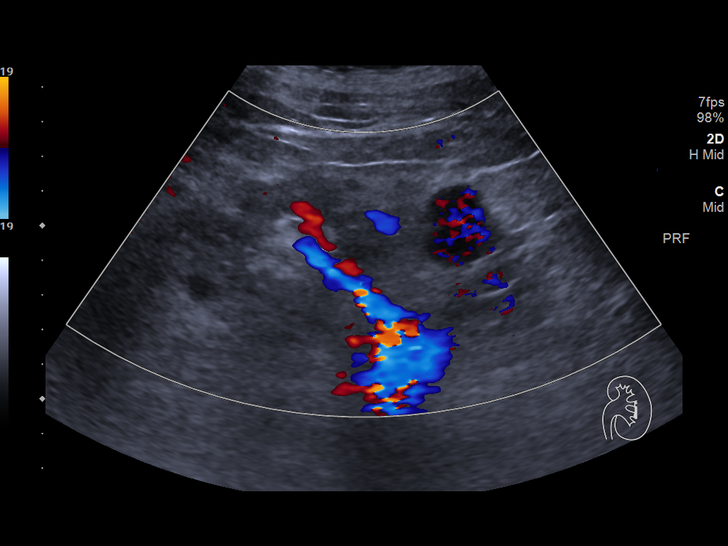
[im 47/71]
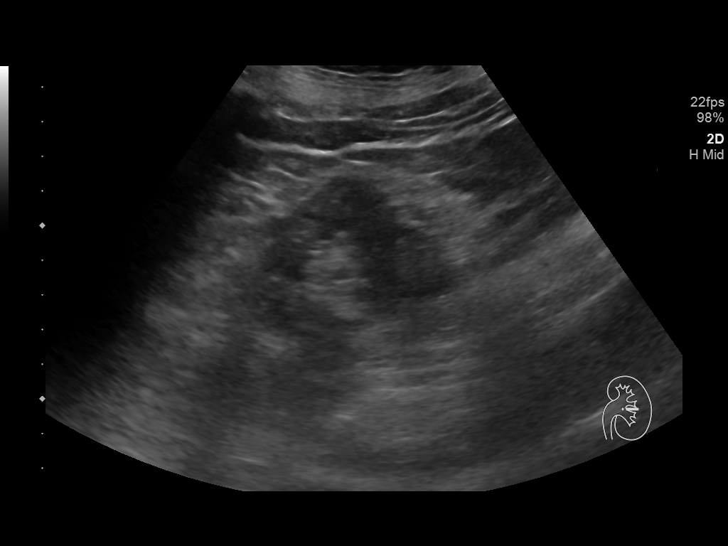
[im 53/71]
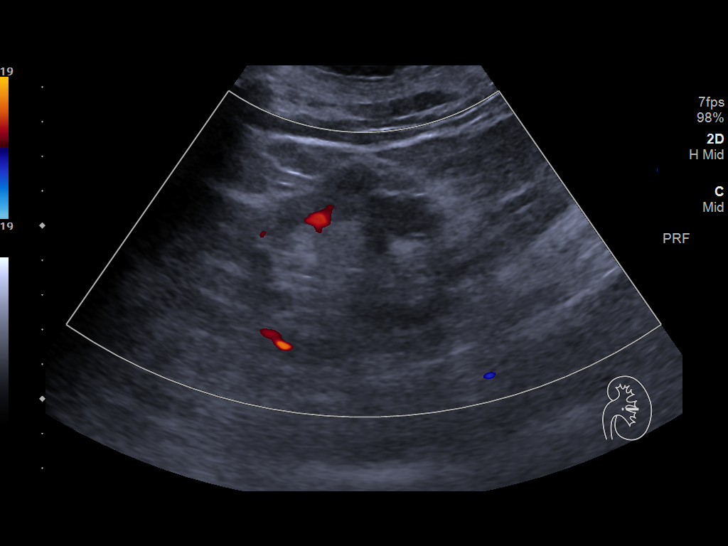
[im 59/71]
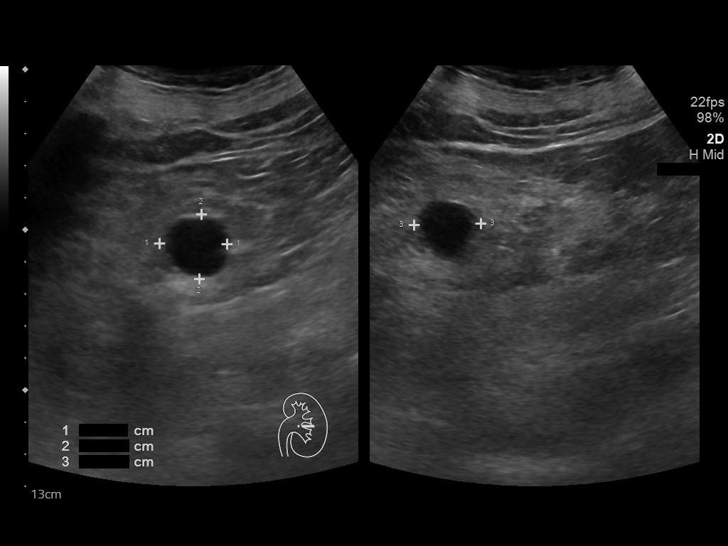
[im 65/71]
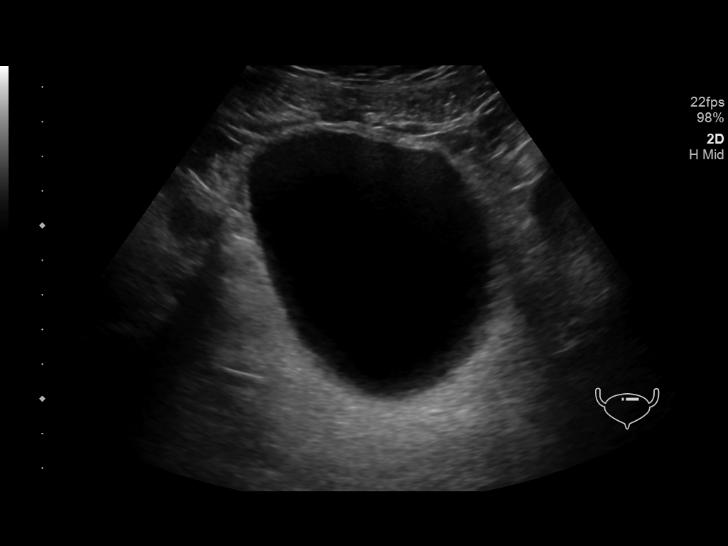
[im 71/71]
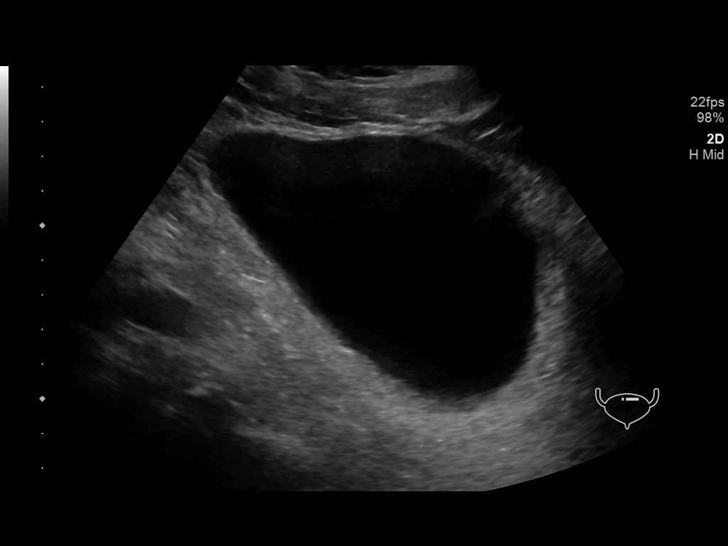

[13 of 25 positions shown; findings below may reference images not displayed]

FINDINGS: Right Kidney:

Renal measurements: 11.9 x 5.9 x 6.9 cm = volume: 250.1 mL. Mild
increased echogenicity cannot be excluded. No hydronephrosis.
Multiple simple cysts, the largest measures 1.8 x 2.0 x 1.8 cm. A
2.4 x 1.9 x 1.7 cm possible solid lesion noted in the right mid
kidney. Renal MRI suggested for further evaluation.

Left Kidney:

Renal measurements: Size in 3 dimensions = volume: 262.8 mL. Mild
increased echogenicity cannot be excluded. No hydronephrosis.
Multiple simple cysts, the largest measures 2.4 x 2.4 x 2.4 cm. A
2.5 x 1.7 x 2.3 complex cyst with faint internal echoes, possibly a
proteinaceous cyst, again noted.

Bladder:

Appears normal for degree of bladder distention.

Other:

None.
IMPRESSION: 1. 2.4 x 1.9 x 1.7 cm possible solid lesion is noted the right mid
kidney. MRI of the kidneys suggested for further evaluation.

2. Multiple bilateral simple cysts. A 2.5 x 1.7 x 2.3 cm left renal
complex cyst with faint internal echoes, possibly a proteinaceous
cyst, again noted.

3. Mild increased echogenicity both kidneys cannot be excluded.
Chronic renal disease cannot be excluded. No acute renal abnormality
identified. No hydronephrosis or bladder distention.

## 2020-08-16 DIAGNOSIS — N401 Enlarged prostate with lower urinary tract symptoms: Secondary | ICD-10-CM | POA: Diagnosis not present

## 2020-08-16 DIAGNOSIS — D4101 Neoplasm of uncertain behavior of right kidney: Secondary | ICD-10-CM | POA: Diagnosis not present

## 2020-08-16 DIAGNOSIS — R350 Frequency of micturition: Secondary | ICD-10-CM | POA: Diagnosis not present

## 2020-08-16 DIAGNOSIS — N281 Cyst of kidney, acquired: Secondary | ICD-10-CM | POA: Diagnosis not present

## 2020-08-16 DIAGNOSIS — R3914 Feeling of incomplete bladder emptying: Secondary | ICD-10-CM | POA: Diagnosis not present

## 2020-08-18 ENCOUNTER — Other Ambulatory Visit: Payer: Self-pay | Admitting: Cardiovascular Disease

## 2020-08-20 DIAGNOSIS — H903 Sensorineural hearing loss, bilateral: Secondary | ICD-10-CM | POA: Diagnosis not present

## 2020-08-22 ENCOUNTER — Ambulatory Visit (INDEPENDENT_AMBULATORY_CARE_PROVIDER_SITE_OTHER): Payer: Medicare PPO

## 2020-08-22 DIAGNOSIS — I4819 Other persistent atrial fibrillation: Secondary | ICD-10-CM

## 2020-08-22 LAB — CUP PACEART REMOTE DEVICE CHECK
Battery Remaining Longevity: 161 mo
Battery Voltage: 3.13 V
Brady Statistic RA Percent Paced: 0.09 %
Brady Statistic RV Percent Paced: 47.12 %
Date Time Interrogation Session: 20220414004719
Implantable Lead Implant Date: 20210712
Implantable Lead Implant Date: 20210712
Implantable Lead Location: 753859
Implantable Lead Location: 753860
Implantable Lead Model: 5076
Implantable Lead Model: 5076
Implantable Pulse Generator Implant Date: 20210712
Lead Channel Impedance Value: 285 Ohm
Lead Channel Impedance Value: 342 Ohm
Lead Channel Impedance Value: 418 Ohm
Lead Channel Impedance Value: 456 Ohm
Lead Channel Pacing Threshold Amplitude: 0.625 V
Lead Channel Pacing Threshold Amplitude: 1 V
Lead Channel Pacing Threshold Pulse Width: 0.4 ms
Lead Channel Pacing Threshold Pulse Width: 0.4 ms
Lead Channel Sensing Intrinsic Amplitude: 0.875 mV
Lead Channel Sensing Intrinsic Amplitude: 0.875 mV
Lead Channel Sensing Intrinsic Amplitude: 12 mV
Lead Channel Sensing Intrinsic Amplitude: 12 mV
Lead Channel Setting Pacing Amplitude: 2.5 V
Lead Channel Setting Pacing Amplitude: 3.5 V
Lead Channel Setting Pacing Pulse Width: 0.4 ms
Lead Channel Setting Sensing Sensitivity: 0.9 mV

## 2020-08-26 ENCOUNTER — Telehealth: Payer: Self-pay | Admitting: Cardiovascular Disease

## 2020-08-26 ENCOUNTER — Ambulatory Visit: Payer: Medicare PPO | Admitting: Cardiovascular Disease

## 2020-08-26 NOTE — Telephone Encounter (Signed)
Call patient and wife. Advised of note from PharmD. They will try not to have Renaxa and see how this does and will let us know.

## 2020-08-26 NOTE — Telephone Encounter (Signed)
Called patient, spoke to him and his wife- she advised that they believe the ringing in his ears are coming from the Renaxa. I did advise I did not believe this was a side effect- but they states when he took the 1000 mg it was worse, and when medication was decreased to 500 mg it did get better but has now over powered what hearing aids he does have. I did recommend that they contact ENT and make them aware that the ringing in his ears have worsened. Wife will call them as well, but wanted to have pharmacy review his medications and see if any medications could be causing this to be worse.  Thank you!

## 2020-08-26 NOTE — Telephone Encounter (Signed)
Pt c/o medication issue:  1. Name of Medication: ranolazine (RANEXA) 500 MG 12 hr tablet  2. How are you currently taking this medication (dosage and times per day)? Take 1 tablet (500 mg total) by mouth 2 (two) times daily.  3. Are you having a reaction (difficulty breathing--STAT)? No  4. What is your medication issue? Pt feels like the ringing in his ears is due to a taking his heart medication.   Pt visited ENT last week 08/20/20 Dr. Jenne Campus and told he needed hearing aids.

## 2020-08-26 NOTE — Telephone Encounter (Signed)
The Ranexa can cause tinnitus, but so can the isosorbide.  These are both used to decrease chest pain/angina.  Metoprolol and furosemide also have the potential to cause tinnitus, but we rarely see this.  Can try stopping the ranexa and see if the problem disappears, but may notice more angina.  Do not stop both isosorbide and ranexa.

## 2020-09-07 NOTE — Addendum Note (Signed)
Encounter addended by: Novella Olive on: 09/07/2020 1:13 PM  Actions taken: Letter saved

## 2020-09-09 NOTE — Progress Notes (Signed)
Remote pacemaker transmission.   

## 2020-09-16 NOTE — Telephone Encounter (Signed)
Called patient, spoke to patient wife advised okay to start back on medication per PharmD. Wife verbalized understanding.

## 2020-09-16 NOTE — Telephone Encounter (Signed)
Patient wife and patient called back in- wanting to know if they can restart Renaxa?   I advised on how the ringing in the ear was. He states that it was not worse, but not a big improvement.  Is he okay to restart it?  Advised I would send to pharmacy and then would call them back.   Patient verbalized understanding.

## 2020-09-16 NOTE — Telephone Encounter (Signed)
Yes, ok to resume ranexa

## 2020-09-21 ENCOUNTER — Other Ambulatory Visit: Payer: Self-pay | Admitting: Cardiovascular Disease

## 2020-10-08 ENCOUNTER — Telehealth: Payer: Self-pay | Admitting: Cardiovascular Disease

## 2020-10-08 NOTE — Telephone Encounter (Signed)
Patient's wife would like to know if the patient is recommended to get the booster shot. She states their daughter was exposed and she is getting tested so they think they may need to get the booster if it is recommended. She states they heard of someone with heart problems that was told not to take the booster.

## 2020-10-08 NOTE — Telephone Encounter (Signed)
Called patient, spoke to wife- she just wants to know if patient would be okay to get the booster. I did advise that Dr.Kelly was out of office this week but I would send a message back to get a response- they advised that their daughter who lives with them is getting tested for COVID as she is not feeling well. I was unaware of the recommendations from CDC in regards to being exposed to COVID and then receiving the booster- he has had his first two vaccines.  Thanks!

## 2020-10-20 ENCOUNTER — Other Ambulatory Visit: Payer: Self-pay | Admitting: Allergy and Immunology

## 2020-10-20 ENCOUNTER — Other Ambulatory Visit: Payer: Self-pay | Admitting: Cardiovascular Disease

## 2020-10-25 NOTE — Telephone Encounter (Signed)
He is concerned that he may have contracted COVID from his daughter he may want to defer his booster for several months.  However if he has been stable with no infection and has been at least 6 months since his last vaccination it is probably okay for him to receive the booster now

## 2020-10-28 NOTE — Telephone Encounter (Signed)
Pt and wife updated with MD's recommendations and verbalized understanding.

## 2020-10-30 ENCOUNTER — Other Ambulatory Visit: Payer: Self-pay | Admitting: Allergy and Immunology

## 2020-11-05 ENCOUNTER — Other Ambulatory Visit: Payer: Self-pay

## 2020-11-05 ENCOUNTER — Ambulatory Visit: Payer: Medicare PPO | Admitting: Cardiovascular Disease

## 2020-11-05 ENCOUNTER — Encounter: Payer: Self-pay | Admitting: Cardiovascular Disease

## 2020-11-05 DIAGNOSIS — Z7901 Long term (current) use of anticoagulants: Secondary | ICD-10-CM

## 2020-11-05 DIAGNOSIS — I4811 Longstanding persistent atrial fibrillation: Secondary | ICD-10-CM

## 2020-11-05 DIAGNOSIS — R600 Localized edema: Secondary | ICD-10-CM

## 2020-11-05 DIAGNOSIS — Z950 Presence of cardiac pacemaker: Secondary | ICD-10-CM

## 2020-11-05 DIAGNOSIS — I350 Nonrheumatic aortic (valve) stenosis: Secondary | ICD-10-CM

## 2020-11-05 DIAGNOSIS — E785 Hyperlipidemia, unspecified: Secondary | ICD-10-CM

## 2020-11-05 DIAGNOSIS — G4733 Obstructive sleep apnea (adult) (pediatric): Secondary | ICD-10-CM

## 2020-11-05 DIAGNOSIS — I25700 Atherosclerosis of coronary artery bypass graft(s), unspecified, with unstable angina pectoris: Secondary | ICD-10-CM

## 2020-11-05 MED ORDER — FUROSEMIDE 20 MG PO TABS: 40.0000 mg | ORAL_TABLET | Freq: Every day | ORAL | 3 refills | Status: DC

## 2020-11-05 NOTE — Progress Notes (Signed)
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 two sprays in each nostril every six hours as needed to dry up nose. 15 mL 5   isosorbide mononitrate (IMDUR) 60 MG 24 hr tablet TAKE 1 AND 1/2 TABS (90MG) BY MOUTH EVERY MORNING AND 1/2 TAB EVERY EVENING 180 tablet 3   levothyroxine (SYNTHROID) 25 MCG tablet Take 25 mcg by mouth daily before breakfast.      lidocaine (LIDODERM) 5 % Place 1 patch onto the skin every 12 (twelve) hours. Remove & Discard patch within 12 hours or as directed by MD 10 patch 0   loratadine (CLARITIN) 10 MG tablet Take 10 mg by mouth every evening.      Multiple Vitamins-Minerals (CENTRUM SILVER 50+MEN) TABS Take 1 tablet by mouth at bedtime.     nitroGLYCERIN (NITROLINGUAL) 0.4 MG/SPRAY spray PLACE 1 SPRAY UNDER THE TONGUE EVERY 5 (FIVE) MINUTES X 3 DOSES AS NEEDED FOR CHEST PAIN. 12 g 1   Omega-3 Fatty Acids (FISH OIL) 1000 MG CAPS Take 1,000 mg by mouth 2 (two) times daily.      pantoprazole (PROTONIX) 40 MG tablet TAKE 1 TABLET BY MOUTH EVERY DAY 90 tablet 0   ranolazine (RANEXA) 500 MG 12 hr tablet Take 1 tablet (500 mg total) by mouth 2 (two) times daily. 60 tablet 6   tamsulosin (FLOMAX) 0.4 MG CAPS capsule Take 1 capsule by mouth daily.     vitamin C (ASCORBIC ACID) 500 MG tablet Take 500 mg by mouth at  bedtime.      vitamin E 400 UNIT capsule Take 400 Units by mouth daily.     furosemide (LASIX) 20 MG tablet Take 2 tablets (40 mg total) by mouth daily. 180 tablet 3   metoprolol succinate (TOPROL-XL) 50 MG 24 hr tablet Take 0.5 tablets (25 mg total) by mouth daily. Take with or immediately following a meal. 90 tablet 1   No current facility-administered medications for this visit.    Socially he is married and has 2 children and 4 grandchildren. There is no tobacco alcohol use. Recently he was has been very active.  ROS General: Negative; No fevers, chills, or night sweats;  HEENT: He is hard of hearing; A new complaint is that of ringing in his ears; no visual changes, sinus congestion, difficulty swallowing Pulmonary: Negative; No cough, wheezing, shortness of breath, hemoptysis Cardiovascular:  See HPI GI: Positive for recent GI bleed secondary to diverticular disease GU: Negative; No dysuria, hematuria, or difficulty voiding Musculoskeletal: Negative; no myalgias, joint pain, or weakness Hematologic/Oncology: Negative; no easy bruising, bleeding Endocrine: Negative; no heat/cold intolerance; no diabetes Neuro: Negative; no changes in balance, headaches Skin: Negative; No rashes or skin lesions Psychiatric: Negative; No behavioral problems, depression Sleep: Positive for OSA with history of snoring, daytime sleepiness, no bruxism, restless legs, hypnogognic hallucinations, no cataplexy Other comprehensive 14 point system review is negative.  PE BP 130/70 (BP Location: Right Arm)   Pulse 76   Ht _0  (1.727 m)   Wt 185 lb 9.6 oz (84.2 kg)   SpO2 98%   BMI 28.22 kg/m    Repeat blood pressure by me was 118/70  Wt Readings from Last 3 Encounters:  11/05/20 185 lb 9.6 oz (84.2 kg)  07/11/20 181 lb 9.6 oz (82.4 kg)  06/03/20 183 lb (83 kg)   General: Alert, oriented, no distress.  Skin: normal turgor, no rashes, warm and dry HEENT: Normocephalic, atraumatic. Pupils equal  round and reactive to light; sclera anicteric; extraocular muscles intact; bilateral hearing aids Nose without nasal septal hypertrophy Mouth/Parynx benign; Mallinpatti scale 3 Neck: No JVD, no carotid bruits; normal carotid upstroke Lungs: clear to ausculatation and percussion; no wheezing or rales Chest wall: without tenderness to palpitation Heart: PMI not displaced, RRR, s1 s2 normal, 8-3/6 systolic murmur, no diastolic murmur, no rubs, gallops, thrills, or heaves Abdomen: soft, nontender; no hepatosplenomehaly, BS+; abdominal aorta nontender and not dilated by palpation. Back: no CVA tenderness Pulses 2+ Musculoskeletal: full range of motion, normal strength, no joint deformities Extremities: 2+ right lower extremity edema and 1+ left lower extremity edema.  No clubbing cyanosis, Homan's sign negative  Neurologic: grossly nonfocal; Cranial nerves grossly wnl Psychologic: Normal mood and affect    ECG (independently read by me):  Atrial fibrillation at 76, LVH with QRS widening, LAD;  March 3,2022ECG (independently read by me): Atrial fibrillation at 64, QTc 480 msec  ECG (independently read by me): Atrial fibrillation at 62 with a competing junctional pacemaker, Left axis deviation, QTc 460 msec  March 2020 ECG (independently read by me): Sinus rhythm at 71 bpm.  First-degree block with 212 ms.  Right bundle branch block with repolarization changes.  Left anterior hemiblock.  January 26, 2018 ECG (independently read by me): Sinus bradycardia at 47 bpm with first-degree AV block.  PR interval 218 ms.  LVH with QRS widening.  Left axis deviation.  October 28, 2017 ECG (independently read by me): Atrial fibrillation with variable rate that seems to be more in the 50s to 60s but following a pause drops into the 40s  August 26, 2017 ECG (independently read by me): atrial fibrillation at 56 bpm.  Nonspecific ST changes.  QTc interval 476 ms.  February 2019 ECG (independently read by me):  Atrial fibrillation at 71 bpm.  LVH by voltage in aVL.  Nonspecific T changes.  04/27/2017 ECG (independently read by me): Normal sinus rhythm at 60 bpm.  Nonspecific intraventricular block.  Anterior T-wave abnormality  03/23/2017 ECG (independently read by me): atrial fibrillation at 64 bpm.  Nonspecific interventricular block.  Nonspecific T wave abnormality.  02/17/2017 ECG (independently read by me): Atrial fibrillation at 72 bpm.  RV conduction delay.  QTc interval 453 ms.  01/29/2017 ECG (independently read by me): Atrial fibrillation at 56 bpm.  LVH by voltage criteria.  Nonspecific ST changes.  QTc interval 430 ms.  August 2018 ECG (independently read by me): Sinus bradycardia 59 bpm.  LVH by voltage.  QTc interval 469 ms.  No significant ST-T changes.  April 2018 ECG (independently read by me): Sinus rhythm at 61 bpm.  RV conduction delay.  LVH.  March 2018 ECG (independently read by me): Atrial fibrillation at 71 bpm.  RV conduction delay.  QTc interval 456 ms.  Left axis deviation.  February 2018 ECG (independently read by me): Normal sinus rhythm at 60 bpm.  LVH.  QTc interval at 464 ms.  Nonspecific T abnormality  December 2017 ECG (independently read by me):  Sinus bradycardia 57 bpm.  LVH by voltage. No significant ST changes.  January 10, 2016 ECG (independently read by me): Normal sinus rhythm at 64 bpm.  LVH.  QTc interval 468 ms.  December 2016 ECG (independently read by me):  Sinus bradycardia 51 bpm.  No ectopy. QTc interval 429 ms.  LVH by voltage criteria in aVL.  September 2016 ECG (independently read by me):  Sinus bradycardia with first-degree AV block. RV conduction delay. No significant ST-T changes.   August 2016 ECG (independently read by me): Sinus bradycardia 57 bpm.  Mild RV conduction delay.  May 2016 ECG (independently read by me): Sinus bradycardia at 49 bpm with first-degree AV block with a PR interval 218 ms.  Right reticular conduction  delay.  December 2015 ECG (independently read by me).  For these: Sinus bradycardia 57 bpm.  First-degree AV block with a PR interval at 224 ms.  No significant ST-T changes.  March 2015 ECG (independently read by me): Sinus rhythm at 59 beats per minute. Mild LVH by voltage criteria in aVL. Normal intervals.  01/05/2013 ECG: Normal sinus rhythm at 62. Mild RV conduction delay. PR interval 204 ms, QTc interval 452 ms.  LABS: BMP Latest Ref Rng & Units 02/05/2020 01/03/2020 12/11/2019  Glucose 70 - 99 mg/dL 94 97 98  BUN 8 - 23 mg/dL _0 Creatinine 0.61 - 1.24 mg/dL 1.43(H) 1.36(H) 1.39(H)  BUN/Creat Ratio 10 - 24 - 14 -  Sodium 135 - 145 mmol/L 140 143 141  Potassium 3.5 - 5.1 mmol/L 3.8 3.9 4.0  Chloride 98 - 111 mmol/L 112(H) 108(H) 109  CO2 22 - 32 mmol/L _1 Calcium 8.9 - 10.3 mg/dL 9.0 8.8 8.9   Hepatic Function Latest Ref Rng & Units 11/10/2019 03/14/2018 03/13/2018  Total Protein 6.5 - 8.1 g/dL 5.8(L) - -  Albumin 3.5 - 5.0 g/dL 3.3(L) 2.2(L) 2.4(L)  AST 15 - 41 U/L 41 - -  ALT 0 - 44 U/L 50(H) - -  Alk Phosphatase 38 - 126 U/L 45 - -  Total Bilirubin 0.3 - 1.2 mg/dL 0.9 - -  Bilirubin, Direct 0.1 - 0.5 mg/dL - - -   CBC Latest Ref Rng & Units 02/05/2020 01/10/2020 12/11/2019  WBC 4.0 - 10.5 K/uL 6.4 5.7 5.1  Hemoglobin 13.0 - 17.0 g/dL 12.5(L) 11.6(L) 11.1(L)  Hematocrit 39.0 - 52.0 % 40.7 37.1(L) 35.9(L)  Platelets 150 - 400 K/uL 156 141(L) 122(L)   Lab Results  Component Value Date   MCV 97.1 02/05/2020   MCV 96.4 01/10/2020   MCV 97.3 12/11/2019   Lab Results  Component Value Date   TSH 1.744 11/20/2019   Lab Results  Component Value Date   HGBA1C 5.5 11/10/2019     Lipid Panel     Component Value Date/Time   CHOL 106 07/01/2016 1345   CHOL 137  04/09/2014 1115   TRIG 120 07/01/2016 1345   TRIG 182 (H) 04/09/2014 1115   HDL 35 (L) 07/01/2016 1345   HDL 32 (L) 04/09/2014 1115   CHOLHDL 3.0 07/01/2016 1345   VLDL 24 07/01/2016 1345   LDLCALC 47  07/01/2016 1345   LDLCALC 69 04/09/2014 1115     RADIOLOGY: No results found.  A CPAP download was obtained in the office with reference to his obstructive sleep apnea from August 18 through January 24, 2018. He is 100% compliant with usage days and averaging 6 hours and 35 minutes of usage daily.  At his 12 cm pressure, AHI is excellent at 0.9.  He does have a mask leak.  CPAP November 10 through April 18, 2018: Compliance 97% usage days and usage greater than 4 hours.  Average use 6 hours 46 minutes.  At 12 cm pressure, AHI 1.2/h.  CPAP download June 25, 2018 through July 24, 2018.  Usage 87%, usage greater than 4 hours 83%, average usage on days used 6 hours and 34 minutes.  AHI 1.2 8012 cm pressure.  A download was obtained in the office from May 2022 through November 04, 2020.  Usage days only 63%.  Average usage is 6 hours 22 minutes.  AHI 5.0 with a auto minimum pressure 11 maximum pressure 16 with 95 percentile pressure 11.5.  There is mask leak.  IMPRESSION:  1. Longstanding persistent atrial fibrillation ([place])   2. Coronary artery disease involving coronary bypass graft of native heart with  angina pectoris ([place])   3. Hyperlipidemia LDL goal <70   4. OSA (obstructive sleep apnea)   5. Bilateral lower extremity edema   6. Severe aortic stenosis: Status post TAVR   7. Chronic anticoagulation   8. Pacemaker     ASSESSMENT AND PLAN: Mr. Gehring is an 85 year old  male who is status post CABG revascularization surgery in 1994.  Subsequently he underwent DES stenting to the graft supplying the diagonal vessel in 2003.  In July 2017 he was hospitalized with atrial fibrillation in the setting of bradycardia and hypotension leading to medication adjustment  An echo Doppler study of 11/21/2015 showed an EF of 55-60%, mild aortic stenosis with a valve area of 1.75 mm per there was mild pulmonary hypertension. Subsequently he had complained of experiencing some episodes of chest  tightness and his symptoms improved with further titration of nitrates as well as beta blocker therapy.  He developed significant anemia, which undoubtedly exacerbated his anginal symptomatology.  At catheterization in July 2018 although his LIMA to mid LAD and vein graft to diagonal vessel are patent the very proximal LAD had a 95% stenosis which supplies a diagonal vessel, which was not supplied by the grafts.  His native RCA and graft to RCA was occluded and there is some collateralization to the distal RCA via the vein graft supplying the circumflex marginal vessel.  His anginal symptoms have improved with stabilization of his hemoglobin.  Spironolactone was discontinued secondary to renal insufficiency.  For 3 years he was treated with medication for his class II anginal symptomatology.  However in June 2021 anginal symptoms progressed.  At that time repeat catheterization revealed similar CAD but progressive aortic valve stenosis.  He underwent successful TAVR with a 26 mm sapient 3 ultra TH VV the transfemoral approach on November 14, 2019.  Subsequently he developed several syncopal spells was found to have prolonged pauses of 8 and 11 seconds for which she required insertion of a dual-chamber [employer]onic  permanent pacemaker by Dr. Caryl Comes on November 20, 2019.  He had had recurrent episodes of atrial fibrillation and despite cardioversion return back to atrial fibrillation.  He is now in persistent long-term atrial fibrillation.  Amiodarone was discontinued . Since he had concomitant CAD I attempted to further titrate Ranexa to 1000 twice daily since he was no longer on amiodarone but apparently he believes that this may have contributed to some episodes of nausea and possibly ringing in his ears.  As result he is only been taking 500 mg twice daily as previously recommended.  Presently, he continues to be in atrial fibrillation with a controlled ventricular response.  He is on Eliquis for anticoagulation and is without  bleeding.  Blood pressure today is stable on amlodipine 2.5 mg twice a day, isosorbide 90 mg in the morning and 30 mg in the evening, ranolazine 500 mg twice a day, metoprolol succinate 25 mg daily, and he has been taking for furosemide 20 mg daily.  On exam he has 2+ right lower extremity edema and 1+ left lower extremity edema.  I have recommended he increase his furosemide to at least 40 mg daily for the next 3 days and depending upon resolution he can try changing the To 20 alternating with 40 mg every other day unless edema persists and he will need 40 mg daily.  He has not had recent laboratory and repeat chemistry, CBC, TSH and lipid panel will be obtained.  He apparently has not been in his house and has been sleeping on the couch at his daughter's house.  I discussed the importance that he use CPAP on a daily basis.  He continues to be on Zetia 10 mg, fenofibrate, and atorvastatin 20 mg for hyperlipidemia.  He is on levothyroxine 25 mcg for hypothyroidism.  I will see him in 4 months for reevaluation or sooner as needed.  Troy Sine, MD, [facility]  11/06/2020 7:01 PM

## 2020-11-05 NOTE — Patient Instructions (Addendum)
Medication Instructions:  For three days, take 40mg  (2 tablets) of Lasix. After the three days, per Dr. alternate taking 40mg  (2 tablets) and 20mg  (1 tablet) depending on swelling. If you have increased swelling take 40mg  (2 tablets), if your swelling is improved, take 20mg  (1 tablet).   *If you need a refill on your cardiac medications before your next appointment, please call your pharmacy*   Lab Work: CMET, CBC, TSH, Lipid FASTING.   If you have labs (blood work) drawn today and your tests are completely normal, you will receive your results only by: MyChart Message (if you have MyChart) OR A paper copy in the mail If you have any lab test that is abnormal or we need to change your treatment, we will call you to review the results.   Testing/Procedures: Keep your appointment for echo.    Follow-Up: At [facility], you and your health needs are our priority.  As part of our continuing mission to provide you with exceptional heart care, we have created designated Provider Care Teams.  These Care Teams include your primary Cardiologist (physician) and Advanced Practice Providers (APPs -  Physician Assistants and Nurse Practitioners) who all work together to provide you with the care you need, when you need it.  We recommend signing up for the patient portal called "MyChart".  Sign up information is provided on this After Visit Summary.  MyChart is used to connect with patients for Virtual Visits (Telemedicine).  Patients are able to view lab/test results, encounter notes, upcoming appointments, etc.  Non-urgent messages can be sent to your provider as well.   To learn more about what you can do with MyChart, go to .    Your next appointment:   4 month(s)  The format for your next appointment:   In Person  Provider:   , MD

## 2020-11-06 ENCOUNTER — Encounter: Payer: Self-pay | Admitting: Cardiovascular Disease

## 2020-11-07 ENCOUNTER — Other Ambulatory Visit: Payer: Self-pay

## 2020-11-07 ENCOUNTER — Other Ambulatory Visit: Payer: Self-pay | Admitting: Physician Assistant

## 2020-11-07 ENCOUNTER — Ambulatory Visit (HOSPITAL_COMMUNITY): Payer: Medicare PPO | Attending: Internal Medicine

## 2020-11-07 ENCOUNTER — Ambulatory Visit: Payer: Medicare PPO | Admitting: Physician Assistant

## 2020-11-07 ENCOUNTER — Encounter: Payer: Self-pay | Admitting: Physician Assistant

## 2020-11-07 ENCOUNTER — Other Ambulatory Visit (HOSPITAL_COMMUNITY): Payer: Self-pay | Admitting: Physician Assistant

## 2020-11-07 VITALS — BP 130/76 | HR 74 | Ht 68.0 in | Wt 185.3 lb

## 2020-11-07 DIAGNOSIS — I25700 Atherosclerosis of coronary artery bypass graft(s), unspecified, with unstable angina pectoris: Secondary | ICD-10-CM | POA: Diagnosis not present

## 2020-11-07 DIAGNOSIS — Z950 Presence of cardiac pacemaker: Secondary | ICD-10-CM

## 2020-11-07 DIAGNOSIS — E785 Hyperlipidemia, unspecified: Secondary | ICD-10-CM | POA: Diagnosis not present

## 2020-11-07 DIAGNOSIS — N289 Disorder of kidney and ureter, unspecified: Secondary | ICD-10-CM | POA: Diagnosis not present

## 2020-11-07 DIAGNOSIS — I5032 Chronic diastolic (congestive) heart failure: Secondary | ICD-10-CM

## 2020-11-07 DIAGNOSIS — I350 Nonrheumatic aortic (valve) stenosis: Secondary | ICD-10-CM | POA: Diagnosis not present

## 2020-11-07 DIAGNOSIS — I4811 Longstanding persistent atrial fibrillation: Secondary | ICD-10-CM

## 2020-11-07 DIAGNOSIS — Z952 Presence of prosthetic heart valve: Secondary | ICD-10-CM | POA: Diagnosis not present

## 2020-11-07 LAB — ECHOCARDIOGRAM COMPLETE
AR max vel: 2.15 cm2
AV Area VTI: 2.15 cm2
AV Area mean vel: 2.31 cm2
AV Mean grad: 14.3 mmHg
AV Peak grad: 23.9 mmHg
Ao pk vel: 2.44 m/s
Area-P 1/2: 2.42 cm2
Calc EF: 47 %
S' Lateral: 4.3 cm
Single Plane A2C EF: 45.4 %
Single Plane A4C EF: 49.9 %

## 2020-11-07 NOTE — Progress Notes (Signed)
[facility]   MULTIDISCIPLINARY HEART VALVE CLINIC                                       Cardiology Office Note    Date:  11/07/2020   ID:  Jorge Cherry, DOB 02-Mar-1936, MRN [id number]  PCP:  Chilton Greathouse, MD  Cardiologist:  Nicki Guadalajara, MD / Dr. Clifton James & Dr. Laneta Simmers (TAVR)  CC: 1 year s/p TAVR  History of Present Illness:  Jorge Cherry is a 85 y.o. male with a history of CAD s/p CABG x4V (1994), PAF on Eliquis, HTN, HLD, GERD, CKD stage IIIA, and severe AS s/p TAVR (11/14/19) c/b delayed CHB s/p PPM who presents to clinic for follow up .   Pt has a history of CAD s/p CABG in 1994. Cardiac cath 10/18/19 with total occlusion of all native vessels and 3 patent grafts (LIMA to LAD, SVG to OM, SVG to Diagonal), The vein graft to the RCA is occluded. Dr. Tresa Endo and Dr. Excell Seltzer decided that medical management of his CAD was appropriate. Echo 08/14/19 with LVEF=55-60%, mild MR. The aortic valve is thickened and calcified with limited leaflet mobility, mean gradient 35 mmHg, peak gradient 67 mmHg, AVA 1.05 cm2, dimensionless index 0.30. He complained of chest pain and dizziness as well as progressive fatigue, dyspnea on exertion and LE edema.   He was evaluated by the multidisciplinary valve team and underwent successful TAVR with a 26 mm Edwards Sapien 3 Ultra THV via the TF approach on 11/14/19. Post operative echo showed EF 55%, normally functioning TAVR with a mean gradient of 10 mmHg and no PVL. Given underlying conduction disease, he was discharged with a Zio patch.   He was then readmitted 7/12-7/13 after two syncopal episodes found to be secondary to 8-11 second pauses. He underwent implantation of a MDT dual chamber PPM on 11/20/2019 by Dr Graciela Husbands. His Eliquis was increased to 5mg  BID (appropriate dosing for weight, renal function and age) and aspirin discontinued. He has had persistent afib followed by afib clinic. He underwent DCCV on 12/20/19 with return to afib 2 days after. His amio  was increased with plans to repeat DCCV at a later time. He is now off amio and under a rate control strategy.  Today he presents to clinic for follow up. Here with his wife. Doing fine. Does some light yard work. He does have some chronic stable angina that resolves with rest and NTG spray. No SOB. No orthopnea or PND. No dizziness or syncope. No blood in stool or urine. No palpitations. Has mild LE edema that's chronic.   Past Medical History:  Diagnosis Date   Arthritis    "just a touch in my hands" (11/24/2016)   Asthma    Atherosclerosis of renal artery (HCC)    RENAL DOPPLER, 12/10/2011 - Left renal artery demonstrated narrowing with elevated velocities consistent with a 1-59% diameter reduction   CKD (chronic kidney disease) stage 3, GFR 30-59 ml/min (HCC)    "stable now since they backed off the water pills" (11/24/2016)   Coronary artery disease    a. 1994 s/p CABG x 4 (LIMA-LAD, VG->D2, VG->OM, VG->RCA); b. 02/2004 PCI SVG-D2 (3.5x16 Taxus DES). VG->RCA 100. Sev apical LAD dzs distal to LIMA insertion; c. 11/2016 Cath: LAD 95p/157m, D2 100ost, LCX 100ost, 70p/m, RCA 100p/m, RPDA fills via L->R collats. VG->RCA 100, VG->D2 20 ost, patent  prox stent, 30m, LIMA->LAD ok, VG->OM3 20p. EF 55%-->Med Rx.   GERD (gastroesophageal reflux disease)    High cholesterol    History of lower GI bleeding    a. 09/2014 GIB due to diverticulosis/diverticulitis.   Iron deficiency anemia    Labile Hypertension    Moderate aortic stenosis    a. 10/2011 Echo:  EF >55%, mild-mod TR, mild-mod AS, mod Ca2+ of AoV leaflets; b. 07/2016 Echo: EF 60-65%, no rwma, Gr1 DD, mod AS [(S) mean grad , peak grad . Valve area (VTI): 1.33cm^2, (Vmax) 1.44cm^2. Mild MR]; c. 02/2018 Echo: EF 55-60%, no rwma, GR1 DD, Mod AS [Peak Vel (S): 319cm/s, Mean grad (S) , peak grad (S) 66mmHg].   PAF (paroxysmal atrial fibrillation) (HCC)    a. CHA2DS2VASc = 4-->Eliquis.   S/P TAVR (transcatheter aortic valve replacement)  11/14/2019   s/p TAVR with a 26 mm Edwards S3U via the TF approach by Drs Clifton James & Bartle   Sleep apnea     Past Surgical History:  Procedure Laterality Date   CARDIAC CATHETERIZATION  02/27/2004   Coronary intervention and medical management   CARDIAC CATHETERIZATION  11/24/2016   CARDIOVERSION N/A 12/20/2019   Procedure: CARDIOVERSION;  Surgeon: Chilton Si, MD;  [facility] END[facility]COPY;  Service: Cardiovascular;  Laterality: N/A;   CARDIOVERSION N/A 02/05/2020   Procedure: CARDIOVERSION;  Surgeon: Wendall Stade, MD;  Location: [facility] ENDOSCOPY;  Service: Cardiovascular;  Laterality: N/A;   CATARACT EXTRACTION W/ INTRAOCULAR LENS IMPLANT Left    CORONARY ANGIOPLASTY  1994 X 2   "before bypass surgery"   CORONARY ANGIOPLASTY WITH STENT PLACEMENT  03/04/2004   SVG supplying the diagonal vessel stented with a 3.5x49mm Taxus stent post dilated to 4.0 mm   CORONARY ARTERY BYPASS GRAFT  1994   "CABG X4"   INGUINAL HERNIA REPAIR Right    PACEMAKER IMPLANT N/A 11/20/2019   Procedure: PACEMAKER IMPLANT;  Surgeon: Duke Salvia, MD;  Location: [facility] INVASIVE CV LAB;  Service: Cardiovascular;  Laterality: N/A;   RIGHT HEART CATH AND CORONARY/GRAFT ANGIOGRAPHY N/A 11/24/2016   Procedure: Right Heart Cath and Coronary/Graft Angiography;  Surgeon: Lennette Bihari, MD;  Location: [facility] INVASIVE CV LAB;  Service: Cardiovascular;  Laterality: N/A;   RIGHT/LEFT HEART CATH AND CORONARY/GRAFT ANGIOGRAPHY N/A 10/18/2019   Procedure: RIGHT/LEFT HEART CATH AND CORONARY/GRAFT ANGIOGRAPHY;  Surgeon: Lennette Bihari, MD;  Location: [facility] INVASIVE CV LAB;  Service: Cardiovascular;  Laterality: N/A;   TEE WITHOUT CARDIOVERSION N/A 11/14/2019   Procedure: TRANSESOPHAGEAL ECHOCARDIOGRAM (TEE);  Surgeon: Kathleene Hazel, MD;  Location: [facility] INVASIVE CV LAB;  Service: Open Heart Surgery;  Laterality: N/A;   TONSILLECTOMY AND ADENOIDECTOMY     TRANSCATHETER AORTIC VALVE REPLACEMENT, TRANSFEMORAL N/A 11/14/2019    Procedure: TRANSCATHETER AORTIC VALVE REPLACEMENT, TRANSFEMORAL;  Surgeon: Kathleene Hazel, MD;  Location: [facility] INVASIVE CV LAB;  Service: Open Heart Surgery;  Laterality: N/A;    Current Medications: Outpatient Medications Prior to Visit  Medication Sig Dispense Refill   albuterol (VENTOLIN HFA) 108 (90 Base) MCG/ACT inhaler INHALE TWO PUFFS EVERY 4-6 HOURS AS NEEDED FOR COUGH OR WHEEZE 8.5 each 1   amLODipine (NORVASC) 2.5 MG tablet TAKE 1 TABLET BY MOUTH TWICE A DAY 180 tablet 1   atorvastatin (LIPITOR) 20 MG tablet Take 1 tablet (20 mg total) by mouth at bedtime. 30 tablet 0   ELIQUIS 5 MG TABS tablet TAKE 1 TABLET BY MOUTH TWICE A DAY 60 tablet 5   ezetimibe (ZETIA) 10 MG tablet  TAKE 1 TABLET BY MOUTH EVERY DAY 90 tablet 3   fenofibrate (TRICOR) 145 MG tablet TAKE 1/2 TABLETS BY MOUTH DAILY. 45 tablet 11   fluticasone (FLONASE) 50 MCG/ACT nasal spray Place 2 sprays into both nostrils daily as needed for allergies or rhinitis.      furosemide (LASIX) 20 MG tablet Take 2 tablets (40 mg total) by mouth daily. 180 tablet 3   isosorbide mononitrate (IMDUR) 60 MG 24 hr tablet TAKE 1 AND 1/2 TABS (90MG ) BY MOUTH EVERY MORNING AND 1/2 TAB EVERY EVENING 180 tablet 3   levothyroxine (SYNTHROID) 25 MCG tablet Take 25 mcg by mouth daily before breakfast.      loratadine (CLARITIN) 10 MG tablet Take 10 mg by mouth every evening.      Multiple Vitamins-Minerals (CENTRUM SILVER 50+MEN) TABS Take 1 tablet by mouth at bedtime.     nitroGLYCERIN (NITROLINGUAL) 0.4 MG/SPRAY spray PLACE 1 SPRAY UNDER THE TONGUE EVERY 5 (FIVE) MINUTES X 3 DOSES AS NEEDED FOR CHEST PAIN. 12 g 1   Omega-3 Fatty Acids (FISH OIL) 1000 MG CAPS Take 1,000 mg by mouth 2 (two) times daily.      pantoprazole (PROTONIX) 40 MG tablet TAKE 1 TABLET BY MOUTH EVERY DAY 90 tablet 0   ranolazine (RANEXA) 500 MG 12 hr tablet Take 1 tablet (500 mg total) by mouth 2 (two) times daily. 60 tablet 6   vitamin C (ASCORBIC ACID) 500 MG tablet  Take 500 mg by mouth at bedtime.      vitamin E 400 UNIT capsule Take 400 Units by mouth daily.     Azelastine-Fluticasone 137-50 MCG/ACT SUSP use it only 3 days a week such as Monday Wednesday and Friday 23 g 5   docusate sodium (COLACE) 100 MG capsule Take 100 mg by mouth daily as needed for mild constipation. PRN constipation     ipratropium (ATROVENT) 0.06 % nasal spray Can use two sprays in each nostril every six hours as needed to dry up nose. 15 mL 5   lidocaine (LIDODERM) 5 % Place 1 patch onto the skin every 12 (twelve) hours. Remove & Discard patch within 12 hours or as directed by MD 10 patch 0   tamsulosin (FLOMAX) 0.4 MG CAPS capsule Take 1 capsule by mouth daily.     metoprolol succinate (TOPROL-XL) 50 MG 24 hr tablet Take 0.5 tablets (25 mg total) by mouth daily. Take with or immediately following a meal. 90 tablet 1   No facility-administered medications prior to visit.     Allergies:   Altace [ramipril], Mucinex [guaifenesin er], and Contrast media [iodinated diagnostic agents]   Social History   Socioeconomic History   Marital status: Married    Spouse name: Not on file   Number of children: 2   Years of education: Not on file   Highest education level: Not on file  Occupational History   Not on file  Tobacco Use   Smoking status: Never   Smokeless tobacco: Never  Vaping Use   Vaping Use: Never used  Substance and Sexual Activity   Alcohol use: No    Alcohol/week: 0.0 standard drinks   Drug use: No   Sexual activity: Not Currently  Other Topics Concern   Not on file  Social History Narrative   [place]  Retired from [employer].     Social Determinants of Health   Financial Resource Strain: Not on file  Food Insecurity: Not on  file  Transportation Needs: Not on file  Physical Activity: Not on file  Stress: Not on file  Social Connections: Not on file     Family History:  The patient's family history  includes Cancer in his maternal grandfather and mother; Heart disease in his father; Stroke in his maternal grandmother.     ROS:   Please see the history of present illness.    ROS All other systems reviewed and are negative.   PHYSICAL EXAM:   VS:  BP 130/76   Pulse 74   Ht  (1.727 m)   Wt 185 lb 4.8 oz (84.1 kg)   SpO2 97%   BMI 28.17 kg/m    GEN: Well nourished, well developed, in no acute distress HEENT: normal Neck: no JVD or masses Cardiac: irreg irreg; soft flow murmur. No rubs, or gallops. 1+ bilateral LE edema Respiratory:  clear to auscultation bilaterally, normal work of breathing GI: soft, nontender, nondistended, + BS MS: no deformity or atrophy Skin: warm and dry, no rash Neuro:  Alert and Oriented x 3, Strength and sensation are intact Psych: euthymic mood, full affect   Wt Readings from Last 3 Encounters:  11/07/20 185 lb 4.8 oz (84.1 kg)  11/05/20 185 lb 9.6 oz (84.2 kg)  07/11/20 181 lb 9.6 oz (82.4 kg)      Studies/Labs Reviewed:   EKG:  EKG is NOT ordered today.   Recent Labs: 11/10/2019: ALT 50; B Natriuretic Peptide 371.6 11/15/2019: Magnesium 1.8 11/20/2019: TSH 1.744 02/05/2020: BUN 18; Creatinine, Ser 1.43; Hemoglobin 12.5; Platelets 156; Potassium 3.8; Sodium 140   Lipid Panel    Component Value Date/Time   CHOL 106 07/01/2016 1345   CHOL 137 04/09/2014 1115   TRIG 120 07/01/2016 1345   TRIG 182 (H) 04/09/2014 1115   HDL 35 (L) 07/01/2016 1345   HDL 32 (L) 04/09/2014 1115   CHOLHDL 3.0 07/01/2016 1345   VLDL 24 07/01/2016 1345   LDLCALC 47 07/01/2016 1345   LDLCALC 69 04/09/2014 1115    Additional studies/ records that were reviewed today include:  TAVR OPERATIVE NOTE     Date of Procedure:                11/14/2019   Preoperative Diagnosis:      Severe Aortic Stenosis    Postoperative Diagnosis:    Same    Procedure:        Transcatheter Aortic Valve Replacement - Percutaneous Right Transfemoral Approach              Edwards Sapien 3 Ultra THV (size 26 mm, model # 9750TFX, serial # [id number])              Co-Surgeons:                        Alleen Borne, MD and Verne Carrow, MD     Anesthesiologist:                  Glade Stanford, MD   Echocardiographer:              Eloy End, MD   Pre-operative Echo Findings: Severe aortic stenosis Mild left ventricular systolic dysfunction   Post-operative Echo Findings: No paravalvular leak Mild left ventricular systolic dysfunction  _____________     Echo 11/15/2019: IMPRESSIONS  1. Day 1 post TAVR, no paravalvular leak. Normal transaortic gradients  peak/mean 17/10 mmHg. LVEF improved to 55-60%.   2.  Left ventricular ejection fraction, by estimation, is 55 to 60%. The  left ventricle has normal function. There is moderate concentric left  ventricular hypertrophy.   3. Trivial mitral valve regurgitation.   4. The aortic valve has been repaired/replaced. There is a 26 mm Ultra,  stented (TAVR) valve present in the aortic position. Procedure Date:  11/14/2019. Echo findings are consistent with normal structure and  function of the aortic valve prosthesis.  Aortic valve mean gradient measures 10.0 mmHg.   5. There is normal pulmonary artery systolic pressure. The estimated  right ventricular systolic pressure is 29.6 mmHg.    __________________  Echo 01/03/20 IMPRESSIONS  1. Left ventricular ejection fraction, by estimation, is 60 to 65%. The left ventricle has normal function. The left ventricle has no regional wall motion abnormalities. There is moderate left ventricular hypertrophy. Left ventricular diastolic  parameters are indeterminate.  2. Right ventricular systolic function is normal. The right ventricular size is normal. There is normal pulmonary artery systolic pressure.  3. Left atrial size was moderately dilated.  4. The mitral valve is normal in structure. Mild mitral valve regurgitation. No evidence of mitral stenosis.  5.  Tricuspid valve regurgitation is moderate.  6. Mild perivalvular leak. The aortic valve has been repaired/replaced. Aortic valve regurgitation is mild. No aortic stenosis is present. There is a 26 mm Edwards Sapien prosthetic (TAVR) valve present in the aortic position. Procedure Date:  11/14/2019. Aortic valve mean gradient measures 13.0 mmHg. Aortic valve Vmax measures 2.35 m/s.  7. Aortic dilatation noted. There is mild dilatation of the ascending aorta measuring 38 mm.  8. The inferior vena cava is normal in size with greater than 50% respiratory variability, suggesting right atrial pressure of 3 mmHg.  _____________________  Echo 11/07/20 IMPRESSIONS   1. The aortic valve has been repaired/replaced. Aortic valve  regurgitation is trivial. No aortic stenosis is present. There is a 26 mm  Edwards Sapien prosthetic (TAVR) valve present in the aortic position.  Procedure Date: 11/14/2019. Echo findings are  consistent with normal structure and function of the aortic valve  prosthesis. Aortic valve mean gradient measures 14.3 mmHg. Aortic valve  Vmax measures 2.44 m/s. Aortic valve acceleration time measures 92 msec.   2. Left ventricular ejection fraction, by estimation, is 50 to 55%. The  left ventricle has low normal function. Left ventricular endocardial  border not optimally defined to evaluate regional wall motion. There is  mild left ventricular hypertrophy. Left  ventricular diastolic parameters are consistent with Grade II diastolic  dysfunction (pseudonormalization).   3. Right ventricular systolic function is mildly reduced. The right  ventricular size is moderately enlarged. There is normal pulmonary artery  systolic pressure. The estimated right ventricular systolic pressure is  32.6 mmHg.   4. Left atrial size was severely dilated.   5. Right atrial size was mild to moderately dilated.   6. The mitral valve is normal in structure. Mild mitral valve  regurgitation. No evidence  of mitral stenosis.   7. The inferior vena cava is dilated in size with >50% respiratory  variability, suggesting right atrial pressure of 8 mmHg.   Comparison(s): A prior study was performed on 01/03/20. No significant  change from prior study despite differences in reporting. Prior images  reviewed side by side.  ASSESSMENT & PLAN:   Severe AS s/p TAVR: echo today shows EF 50%, normally functioning TAVR with a mean gradient of 14.5 mm hg and trivial PVL. He has NYHA class II symptoms. SBE  prophylaxis discussed; the patient is edentulous and does not go to the dentist. Continue on Eliquis 5mg  BID alone.   CHB s/p PPM: followed by Dr. .   Persistent atrial fibrillation: now off amio and under a rate control strategy. Continue Eliquis.   Chronic diastolic CHF: appears euvolemic on exam off diuretics.    Renal lesion: pre TAVR CT showed multiple indeterminate lesions in the kidneys, most concerning of which measures 2.2 x 1.9 cm in the interpolar region of the left kidney. Follow up MRI showed cystic lesions but recommended follow up Graciela Husbands. He was referred to urology and now followed by Dr. [place].  CAD: with chronic stable angina. Continue ranexa and medical therapy.   Medication Adjustments/Labs and Tests Ordered: Current medicines are reviewed at length with the patient today.  Concerns regarding medicines are outlined above.  Medication changes, Labs and Tests ordered today are listed in the Patient Instructions below. Patient Instructions  Medication Instructions:  Your physician recommends that you continue on your current medications as directed. Please refer to the Current Medication list given to you today.  *If you need a refill on your cardiac medications before your next appointment, please call your pharmacy*   Lab Work: None ordered   Testing/Procedures: None ordered   Follow-Up: At [facility], you and your health needs are our priority.  As part of our  continuing mission to provide you with exceptional heart care, we have created designated Provider Care Teams.  These Care Teams include your primary Cardiologist (physician) and Advanced Practice Providers (APPs -  Physician Assistants and Nurse Practitioners) who all work together to provide you with the care you need, when you need it.  Your next appointment:     Keep your follow up in November  The format for your next appointment:   In Person  Provider:   December, MD   Your physician recommends that you follow-up as needed with Dr. Nicki Guadalajara.   Thank you for choosing [facility]!!     Other Instructions       Signed, [facility], PA-C  11/07/2020 7:17 PM    [facility] [address] [place], [place], [place]  [place] Phone: [phone]; Fax: [phone]

## 2020-11-07 NOTE — Patient Instructions (Signed)
Medication Instructions:  Your physician recommends that you continue on your current medications as directed. Please refer to the Current Medication list given to you today.  *If you need a refill on your cardiac medications before your next appointment, please call your pharmacy*   Lab Work: None ordered   Testing/Procedures: None ordered   Follow-Up: At [facility] you and your health needs are our priority.  As part of our continuing mission to provide you with exceptional heart care, we have created designated Provider Care Teams.  These Care Teams include your primary Cardiologist (physician) and Advanced Practice Providers (APPs -  Physician Assistants and Nurse Practitioners) who all work together to provide you with the care you need, when you need it.  Your next appointment:     Keep your follow up in November  The format for your next appointment:   In Person  Provider:   Nicki Guadalajara, MD   Your physician recommends that you follow-up as needed with Dr. Norton Pastel.   Thank you for choosing [facility]!!     Other Instructions

## 2020-11-08 LAB — COMPREHENSIVE METABOLIC PANEL
ALT: 18 IU/L (ref 0–44)
AST: 24 IU/L (ref 0–40)
Albumin/Globulin Ratio: 1.8 (ref 1.2–2.2)
Albumin: 4.2 g/dL (ref 3.6–4.6)
Alkaline Phosphatase: 62 IU/L (ref 44–121)
BUN/Creatinine Ratio: 14 (ref 10–24)
BUN: 21 mg/dL (ref 8–27)
Bilirubin Total: 0.4 mg/dL (ref 0.0–1.2)
CO2: 21 mmol/L (ref 20–29)
Calcium: 9.3 mg/dL (ref 8.6–10.2)
Chloride: 107 mmol/L — ABNORMAL HIGH (ref 96–106)
Creatinine, Ser: 1.53 mg/dL — ABNORMAL HIGH (ref 0.76–1.27)
Globulin, Total: 2.3 g/dL (ref 1.5–4.5)
Glucose: 81 mg/dL (ref 65–99)
Potassium: 4.2 mmol/L (ref 3.5–5.2)
Sodium: 143 mmol/L (ref 134–144)
Total Protein: 6.5 g/dL (ref 6.0–8.5)
eGFR: 45 mL/min/{1.73_m2} — ABNORMAL LOW (ref >59)

## 2020-11-08 LAB — CBC
Hematocrit: 40.2 % (ref 37.5–51.0)
Hemoglobin: 13.1 g/dL (ref 13.0–17.7)
MCH: 29.8 pg (ref 26.6–33.0)
MCHC: 32.6 g/dL (ref 31.5–35.7)
MCV: 92 fL (ref 79–97)
Platelets: 158 10*3/uL (ref 150–450)
RBC: 4.39 x10E6/uL (ref 4.14–5.80)
RDW: 13.1 % (ref 11.6–15.4)
WBC: 6.8 10*3/uL (ref 3.4–10.8)

## 2020-11-08 LAB — LIPID PANEL
Chol/HDL Ratio: 2.8 ratio (ref 0.0–5.0)
Cholesterol, Total: 109 mg/dL (ref 100–199)
HDL: 39 mg/dL — ABNORMAL LOW (ref >39)
LDL Chol Calc (NIH): 50 mg/dL (ref 0–99)
Triglycerides: 111 mg/dL (ref 0–149)
VLDL Cholesterol Cal: 20 mg/dL (ref 5–40)

## 2020-11-08 LAB — TSH: TSH: 2.89 u[IU]/mL (ref 0.450–4.500)

## 2020-11-08 LAB — SPECIMEN STATUS REPORT

## 2020-11-21 ENCOUNTER — Ambulatory Visit (INDEPENDENT_AMBULATORY_CARE_PROVIDER_SITE_OTHER): Payer: Medicare PPO

## 2020-11-21 DIAGNOSIS — I4811 Longstanding persistent atrial fibrillation: Secondary | ICD-10-CM

## 2020-11-21 LAB — CUP PACEART REMOTE DEVICE CHECK
Battery Remaining Longevity: 156 mo
Battery Voltage: 3.07 V
Brady Statistic RA Percent Paced: 0.03 %
Brady Statistic RV Percent Paced: 47.51 %
Date Time Interrogation Session: 20220714071337
Implantable Lead Implant Date: 20210712
Implantable Lead Implant Date: 20210712
Implantable Lead Location: 753859
Implantable Lead Location: 753860
Implantable Lead Model: 5076
Implantable Lead Model: 5076
Implantable Pulse Generator Implant Date: 20210712
Lead Channel Impedance Value: 285 Ohm
Lead Channel Impedance Value: 361 Ohm
Lead Channel Impedance Value: 437 Ohm
Lead Channel Impedance Value: 437 Ohm
Lead Channel Pacing Threshold Amplitude: 0.625 V
Lead Channel Pacing Threshold Amplitude: 0.875 V
Lead Channel Pacing Threshold Pulse Width: 0.4 ms
Lead Channel Pacing Threshold Pulse Width: 0.4 ms
Lead Channel Sensing Intrinsic Amplitude: 1 mV
Lead Channel Sensing Intrinsic Amplitude: 1 mV
Lead Channel Sensing Intrinsic Amplitude: 13.125 mV
Lead Channel Sensing Intrinsic Amplitude: 13.125 mV
Lead Channel Setting Pacing Amplitude: 2.5 V
Lead Channel Setting Pacing Amplitude: 3.5 V
Lead Channel Setting Pacing Pulse Width: 0.4 ms
Lead Channel Setting Sensing Sensitivity: 0.9 mV

## 2020-12-06 ENCOUNTER — Other Ambulatory Visit: Payer: Self-pay | Admitting: Cardiovascular Disease

## 2020-12-06 NOTE — Telephone Encounter (Signed)
Received a call from the patients spouse requesting a refill on Ranexa.                                                        Chart reviewed.   Rx(s) sent to pharmacy electronically.  Patients wife voiced understanding.

## 2020-12-13 NOTE — Progress Notes (Signed)
Remote pacemaker transmission.   

## 2020-12-20 ENCOUNTER — Telehealth: Payer: Self-pay | Admitting: *Deleted

## 2020-12-20 NOTE — Telephone Encounter (Signed)
Returned a call to the patient. He had some concerns that his machine is blowing too hard. He was informed that his pressure settings actually are not where they need to be due to his AHI being 5.1. he tells me that his pressure at one time was changed in the hospital to [facility] by a nurse. I informed him that in March Dr. Tresa Endo changed him to  auto pressures. Once I told him this he states that he feels much better and we can leave them where they are. His current download shows a high mask leak. I asked him when has he changed his mask and he replied that it was changed about 2 weeks ago. I enforced the fact that he need to wash the mask daily as well as his face at night before putting on the mask. Patient states that he will try this as well as adjusting the straps to see  if this makes any difference. The patient will call me back again on Monday if he has any other concerns over the weekend.  S/N:  I had received a call from Elizebeth Brooking at [facility] yesterday advising me that the patient brought his machine in to their office with the same complaint. They checked out the machine and told him that it is working fine, however it was extremely dirty. They advised him to go home and wash the machine and change out his filters.

## 2020-12-27 ENCOUNTER — Other Ambulatory Visit: Payer: Self-pay | Admitting: Urology

## 2020-12-27 DIAGNOSIS — N281 Cyst of kidney, acquired: Secondary | ICD-10-CM

## 2021-01-03 ENCOUNTER — Other Ambulatory Visit: Payer: Self-pay | Admitting: Cardiovascular Disease

## 2021-01-03 ENCOUNTER — Other Ambulatory Visit (HOSPITAL_COMMUNITY): Payer: Self-pay | Admitting: Urology

## 2021-01-03 DIAGNOSIS — N281 Cyst of kidney, acquired: Secondary | ICD-10-CM

## 2021-01-06 NOTE — Telephone Encounter (Signed)
Prescription refill request for Eliquis received. Indication:atrial fib Last office visit:6/22 Scr:1.5 Age: 85 Weight:84.1 kg  Prescription refilled

## 2021-02-05 ENCOUNTER — Ambulatory Visit (HOSPITAL_COMMUNITY)
Admission: RE | Admit: 2021-02-05 | Discharge: 2021-02-05 | Disposition: A | Discharge status: Home / Self Care | Payer: Medicare PPO | Source: Ambulatory Visit | Attending: Urology | Admitting: Urology

## 2021-02-05 ENCOUNTER — Other Ambulatory Visit: Payer: Self-pay

## 2021-02-05 DIAGNOSIS — K5730 Diverticulosis of large intestine without perforation or abscess without bleeding: Secondary | ICD-10-CM | POA: Diagnosis not present

## 2021-02-05 DIAGNOSIS — K862 Cyst of pancreas: Secondary | ICD-10-CM | POA: Diagnosis not present

## 2021-02-05 DIAGNOSIS — N281 Cyst of kidney, acquired: Secondary | ICD-10-CM | POA: Diagnosis not present

## 2021-02-05 DIAGNOSIS — N2889 Other specified disorders of kidney and ureter: Secondary | ICD-10-CM | POA: Diagnosis not present

## 2021-02-05 IMAGING — MR MR ABDOMEN WO/W CM
25 series · 48 of 48 positions shown · IV contrast (Contrast agent)
Comparison: MRI from [DATE].

CLINICAL DATA: History of renal mass, follow-up evaluation.

EXAM:
MRI ABDOMEN WITHOUT AND WITH CONTRAST
TECHNIQUE: Multiplanar multisequence MR imaging of the abdomen was performed
both before and after the administration of intravenous contrast.
CONTRAST:  10mL GADAVIST GADOBUTROL 1 MMOL/ML IV SOLN

[Series 4: cor haste · coronal · 6.0mm · 1.31mm/px · 2 of 36 slices shown]
[im 1/36]
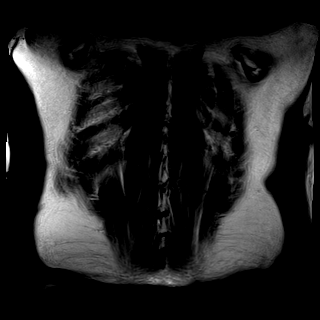
[im 36/36]
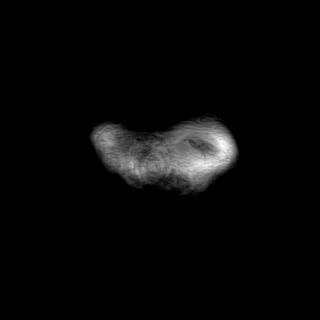

[Series 5: ax haste · axial · 6.0mm · 1.31mm/px · z∈[-188,+64]mm · 2 of 36 slices shown]
[im 1/36]
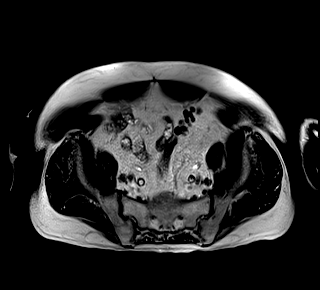
[im 36/36]
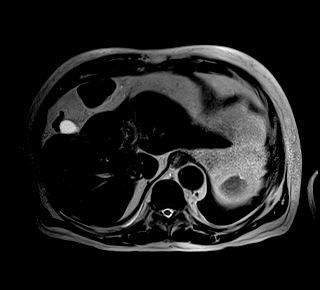

[Series 7: T2 fat-sat · axial · 6.0mm · 1.25mm/px · z∈[-166,+43]mm · 2 of 14 slices shown (1 of 2)]
[im 1/14]
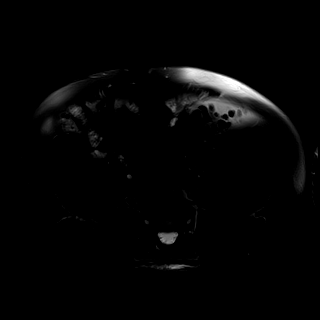
[im 14/14]
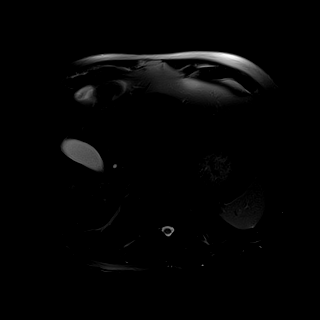

[Series 9: T2 fat-sat · axial · 6.0mm · 1.25mm/px · z∈[-166,+43]mm · 2 of 30 slices shown (2 of 2)]
[im 1/30]
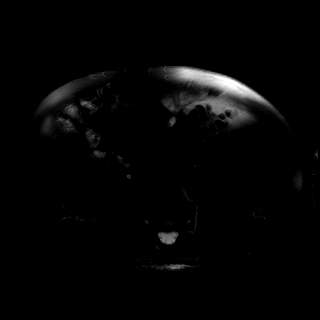
[im 30/30]
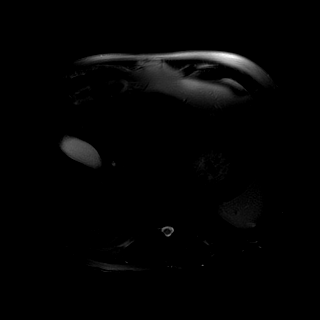

[Series 10: t1_vibe_opp-in_tra_p4_bh · axial · 3.0mm · 1.25mm/px · z∈[-167,+94]mm · 2 of 88 slices shown (1 of 2)]
[im 1/88]
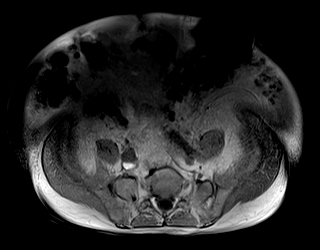
[im 88/88]
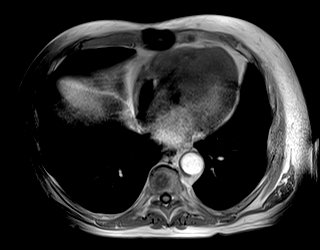

[Series 10: t1_vibe_opp-in_tra_p4_bh · axial · 3.0mm · 1.25mm/px · z∈[-167,+94]mm · 2 of 88 slices shown (2 of 2)]
[im 1/88]
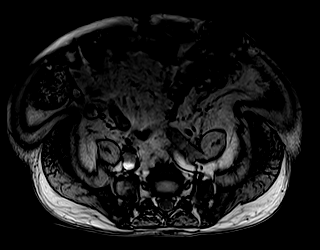
[im 88/88]
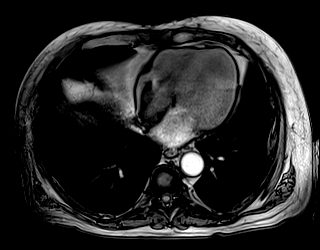

[Series 11: DWI · axial · 6.0mm · 1.49mm/px · z∈[-166,+43]mm · 2 of 88 slices shown (1 of 2)]
[im 1/88]
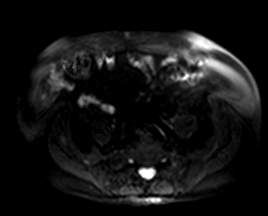
[im 88/88]
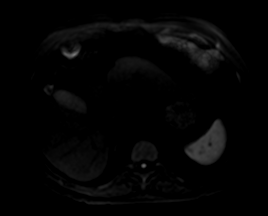

[Series 12: DWI · axial · 6.0mm · 1.49mm/px · 1 of 30 slices shown (2 of 2)]
[im 1/30]
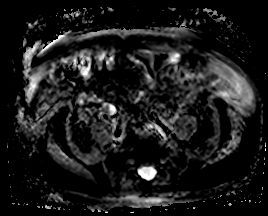

[Series 13: bSSFP · axial · 6.0mm · 0.78mm/px · 1 of 36 slices shown]
[im 1/36]
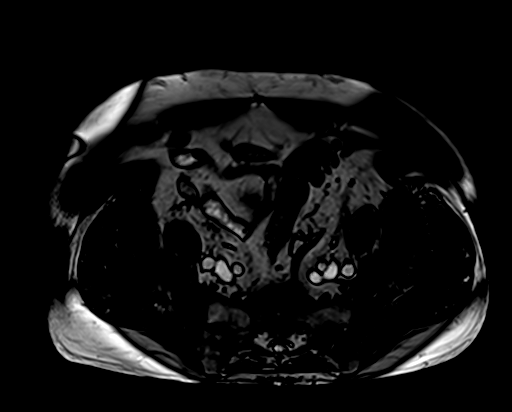

[Series 14: t1_vibe_fs_tra_p4_bh_pre · axial · 3.0mm · 1.25mm/px · z∈[-167,+94]mm · 2 of 88 slices shown (1 of 2)]
[im 1/88]
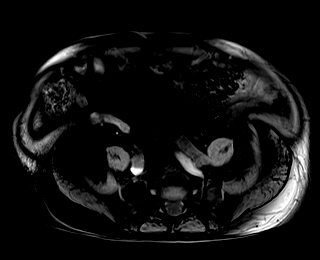
[im 88/88]
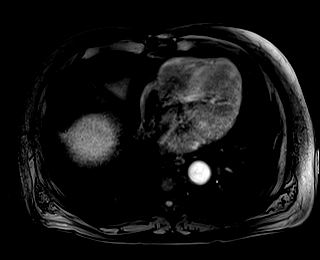

[Series 15: t1_vibe_fs_tra_p4_bh_pre · axial · 3.0mm · 1.25mm/px · z∈[-167,+94]mm · 2 of 88 slices shown (2 of 2)]
[im 1/88]
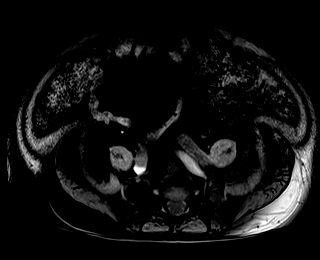
[im 88/88]
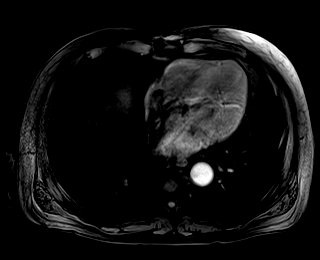

[Series 16: t1_vibe_fs_tra_p4_bh_pre_sub · axial · 3.0mm · 1.25mm/px · z∈[-167,+94]mm · 2 of 88 slices shown]
[im 1/88]
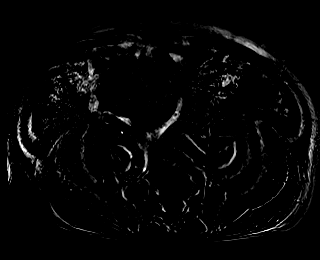
[im 88/88]
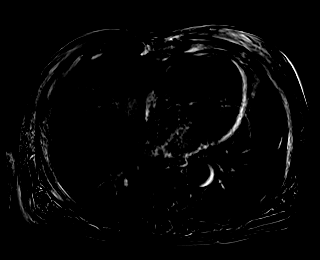

[Series 18: t1_vibe_fs_tra_p4_bh_post · axial · 3.0mm · 1.25mm/px · z∈[-167,+94]mm · 2 of 88 slices shown (1 of 4)]
[im 1/88]
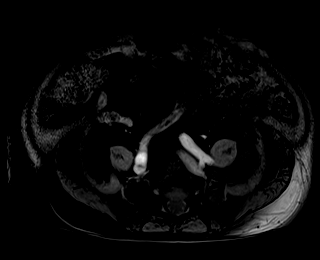
[im 88/88]
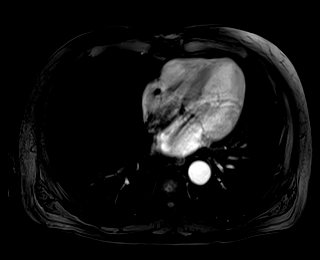

[Series 19: t1_vibe_fs_tra_p4_bh_post_sub · axial · 3.0mm · 1.25mm/px · z∈[-167,+94]mm · 2 of 88 slices shown (1 of 4)]
[im 1/88]
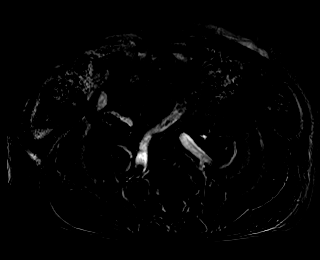
[im 88/88]
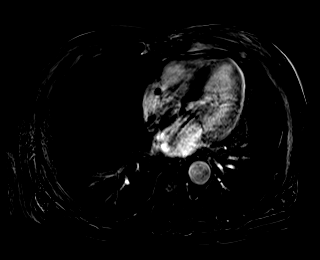

[Series 20: t1_vibe_fs_tra_p4_bh_post · axial · 3.0mm · 1.25mm/px · z∈[-167,+94]mm · 2 of 88 slices shown (2 of 4)]
[im 1/88]
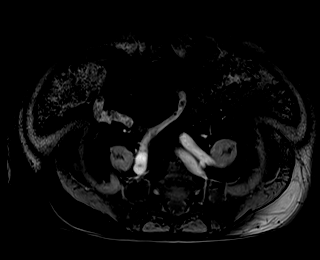
[im 88/88]
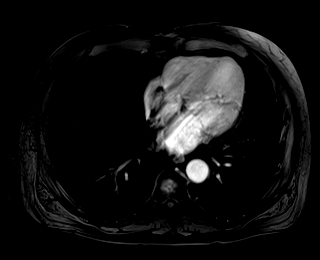

[Series 21: t1_vibe_fs_tra_p4_bh_post_sub · axial · 3.0mm · 1.25mm/px · z∈[-167,+94]mm · 2 of 88 slices shown (2 of 4)]
[im 1/88]
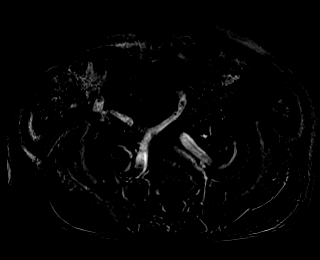
[im 88/88]
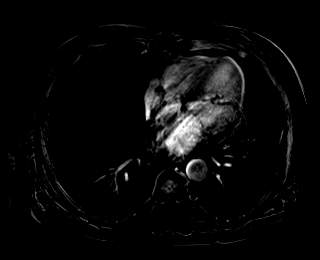

[Series 22: t1_vibe_fs_tra_p4_bh_post · axial · 3.0mm · 1.25mm/px · z∈[-167,+94]mm · 2 of 88 slices shown (3 of 4)]
[im 1/88]
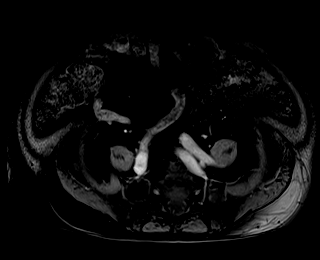
[im 88/88]
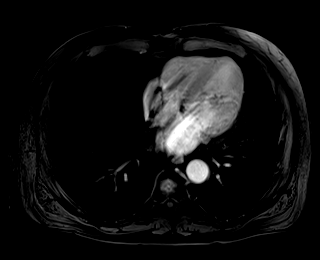

[Series 23: t1_vibe_fs_tra_p4_bh_post_sub · axial · 3.0mm · 1.25mm/px · z∈[-167,+94]mm · 2 of 88 slices shown (3 of 4)]
[im 1/88]
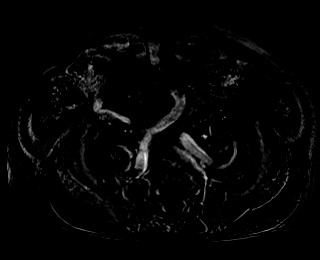
[im 88/88]
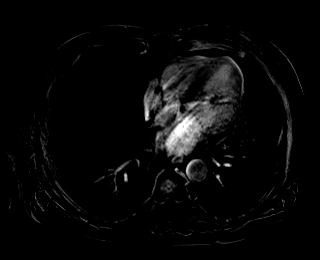

[Series 24: t1_vibe_fs_tra_p4_bh_post · axial · 3.0mm · 1.25mm/px · z∈[-167,+94]mm · 2 of 88 slices shown (4 of 4)]
[im 1/88]
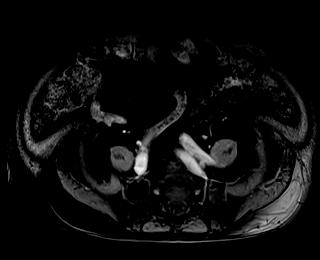
[im 88/88]
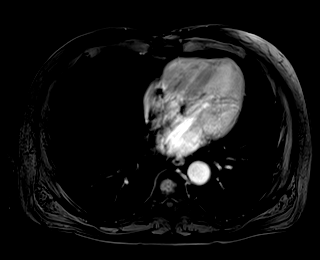

[Series 25: t1_vibe_fs_tra_p4_bh_post_sub · axial · 3.0mm · 1.25mm/px · z∈[-167,+94]mm · 2 of 88 slices shown (4 of 4)]
[im 1/88]
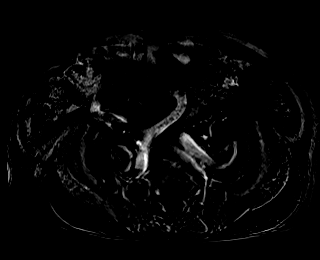
[im 88/88]
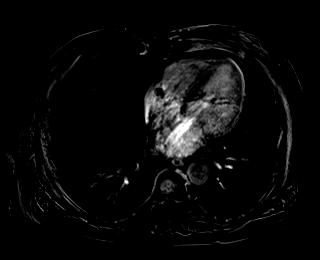

[Series 26: T1 dynamic post-contrast · coronal · 3.0mm · 1.38mm/px · 2 of 80 slices shown]
[im 1/80]
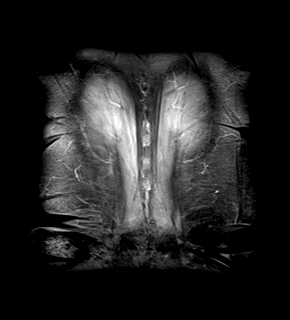
[im 80/80]
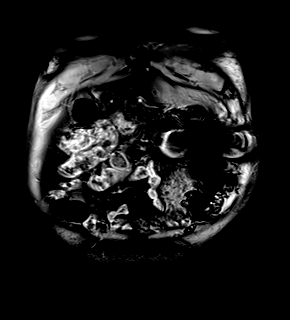

[Series 1007: results sub_p1_t1_vibe_fs_tra_p4_bh_post · axial · 3.0mm · 1.25mm/px · z∈[-167,+94]mm · 2 of 88 slices shown (1 of 4)]
[im 1/88]
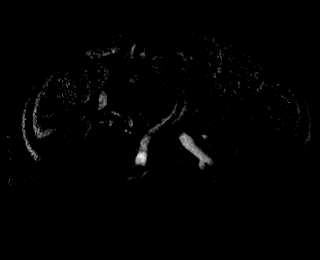
[im 88/88]
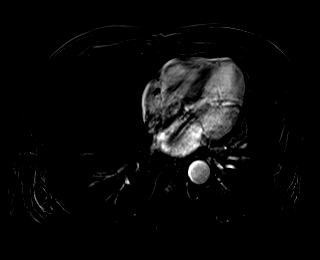

[Series 1009: results sub_p1_t1_vibe_fs_tra_p4_bh_post · axial · 3.0mm · 1.25mm/px · z∈[-167,+94]mm · 2 of 88 slices shown (2 of 4)]
[im 1/88]
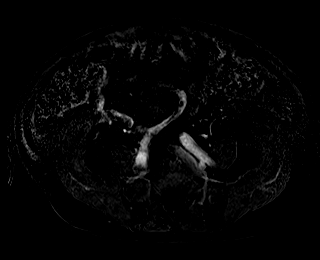
[im 88/88]
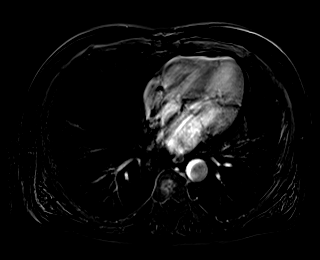

[Series 1011: results sub_p1_t1_vibe_fs_tra_p4_bh_post · axial · 3.0mm · 1.25mm/px · z∈[-167,+94]mm · 2 of 88 slices shown (3 of 4)]
[im 1/88]
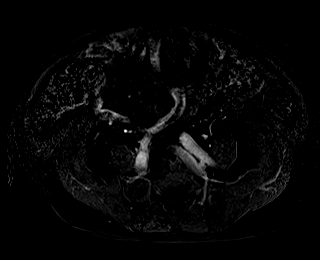
[im 88/88]
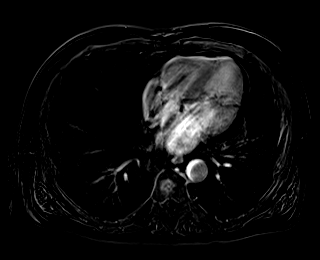

[Series 1013: results sub_p1_t1_vibe_fs_tra_p4_bh_post · axial · 3.0mm · 1.25mm/px · z∈[-167,+94]mm · 2 of 88 slices shown (4 of 4)]
[im 1/88]
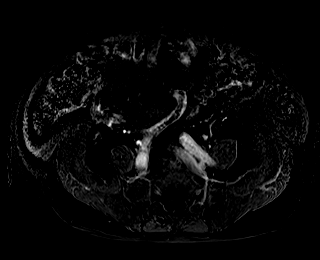
[im 88/88]
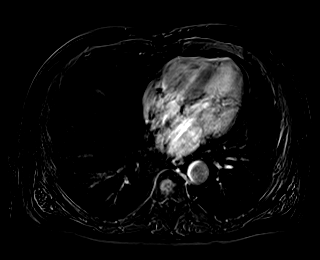

[48 of 48 positions shown; findings below may reference images not displayed]

FINDINGS: Lower chest: Incidental imaging of the lung bases on MRI with
limited assessment. Cardiac enlargement with changes of median
sternotomy. Pacer device in-situ with some mild artifact.

Hepatobiliary: No focal, suspicious hepatic lesion. Portal vein is
patent. Hepatic veins are patent. No biliary duct dilation or
pericholecystic stranding.

Pancreas: Cystic area in the body of the pancreas is noted measuring
approximately 2.4 cm (image 35/20) this has enlarged when compared
to previous imaging seen only on 1 sequence, 1 image due to motion
artifact on the previous study measuring approximately 15 mm on the
previous study. Not well demonstrated on the prior study.

Small cystic lesion in the head of the pancreas (image [DATE]) 8 mm
previously 8 mm. No main duct dilation. No internal enhancement.

Spleen:  Unremarkable.

Adrenals/Urinary Tract:  Adrenal glands are normal.

Proteinaceous and hemorrhagic cysts arise from both the LEFT and
RIGHT kidney.

Lesion arising from the posterior RIGHT kidney (image 60/15) mildly
T1 hyperintense at 19 mm showing no enhancement.

(Image 64/20) mildly hyperintense lesion on T1 measuring
approximately 11 mm displays no enhancement.

Other lesions in the RIGHT kidney display hyperintense features on
T1 with uniform appearance and without change since previous
imaging.

LEFT kidney with hemorrhagic and/or proteinaceous cysts as well. No
suspicious renal lesion on the LEFT. No hydronephrosis. No
perinephric stranding.

Stomach/Bowel: Artifact in the LEFT hemiabdomen may reflect a pill
containing iron or multi vitamin in the enteric tract or within the
colon. No acute gastrointestinal process to the extent evaluated on
abdominal MRI.

Vascular/Lymphatic: Stable mild fusiform dilation of the celiac.
Signs of aortic atherosclerosis. There is no gastrohepatic or
hepatoduodenal ligament lymphadenopathy. No retroperitoneal or
mesenteric lymphadenopathy.

Other:  No ascites

Musculoskeletal: No suspicious bone lesions identified.
IMPRESSION: Proteinaceous and hemorrhagic cysts arise from both kidneys. No
suspicious renal lesion or hydronephrosis.

Cystic area in the body of the pancreas measuring approximately
cm has enlarged (from approximately 1.5 cm) when compared to
previous imaging due to motion artifact on the previous study. This
may represent a enlarging cystic pancreatic neoplasm such as
intraductal papillary mucinous neoplasm. Based on current guidelines
more aggressive management would include EUS. This was not well
assessed on the previous study which had some limitations due to
motion artifact; therefore, could also consider short interval
follow-up in 3-6 months utilizing MRI/MRCP or CT with pancreatic
protocol.

Additional small lesion in the head of the pancreas is unchanged,
attention on follow-up.

Colonic diverticulosis.

Aortic Atherosclerosis ([VT]-[VT]).

## 2021-02-05 MED ORDER — GADOBUTROL 1 MMOL/ML IV SOLN
10.0000 mL | Freq: Once | INTRAVENOUS | Status: AC | PRN
  Administered 2021-02-05: 10 mL via INTRAVENOUS

## 2021-02-05 NOTE — Progress Notes (Signed)
[facility] at [facility] for [facility] abdomen W WO contrast. Patient has medtronic device. CLE sent. Orders received for VOO 85

## 2021-02-06 ENCOUNTER — Other Ambulatory Visit: Payer: Self-pay | Admitting: Allergy and Immunology

## 2021-02-10 ENCOUNTER — Other Ambulatory Visit: Payer: Self-pay | Admitting: Allergy and Immunology

## 2021-02-14 DIAGNOSIS — N401 Enlarged prostate with lower urinary tract symptoms: Secondary | ICD-10-CM | POA: Diagnosis not present

## 2021-02-14 DIAGNOSIS — N281 Cyst of kidney, acquired: Secondary | ICD-10-CM | POA: Diagnosis not present

## 2021-02-14 DIAGNOSIS — N3943 Post-void dribbling: Secondary | ICD-10-CM | POA: Diagnosis not present

## 2021-02-16 ENCOUNTER — Other Ambulatory Visit: Payer: Self-pay | Admitting: Allergy and Immunology

## 2021-02-18 DIAGNOSIS — Z125 Encounter for screening for malignant neoplasm of prostate: Secondary | ICD-10-CM | POA: Diagnosis not present

## 2021-02-18 DIAGNOSIS — E032 Hypothyroidism due to medicaments and other exogenous substances: Secondary | ICD-10-CM | POA: Diagnosis not present

## 2021-02-18 DIAGNOSIS — E785 Hyperlipidemia, unspecified: Secondary | ICD-10-CM | POA: Diagnosis not present

## 2021-02-20 ENCOUNTER — Ambulatory Visit (INDEPENDENT_AMBULATORY_CARE_PROVIDER_SITE_OTHER): Payer: Medicare PPO

## 2021-02-20 DIAGNOSIS — I4811 Longstanding persistent atrial fibrillation: Secondary | ICD-10-CM

## 2021-02-21 LAB — CUP PACEART REMOTE DEVICE CHECK
Battery Remaining Longevity: 152 mo
Battery Voltage: 3.03 V
Brady Statistic RA Percent Paced: 0.02 %
Brady Statistic RV Percent Paced: 48.47 %
Date Time Interrogation Session: 20221014114006
Implantable Lead Implant Date: 20210712
Implantable Lead Implant Date: 20210712
Implantable Lead Location: 753859
Implantable Lead Location: 753860
Implantable Lead Model: 5076
Implantable Lead Model: 5076
Implantable Pulse Generator Implant Date: 20210712
Lead Channel Impedance Value: 304 Ohm
Lead Channel Impedance Value: 380 Ohm
Lead Channel Impedance Value: 437 Ohm
Lead Channel Impedance Value: 456 Ohm
Lead Channel Pacing Threshold Amplitude: 0.625 V
Lead Channel Pacing Threshold Amplitude: 0.875 V
Lead Channel Pacing Threshold Pulse Width: 0.4 ms
Lead Channel Pacing Threshold Pulse Width: 0.4 ms
Lead Channel Sensing Intrinsic Amplitude: 0.5 mV
Lead Channel Sensing Intrinsic Amplitude: 0.5 mV
Lead Channel Sensing Intrinsic Amplitude: 14.875 mV
Lead Channel Sensing Intrinsic Amplitude: 14.875 mV
Lead Channel Setting Pacing Amplitude: 2.5 V
Lead Channel Setting Pacing Amplitude: 3.5 V
Lead Channel Setting Pacing Pulse Width: 0.4 ms
Lead Channel Setting Sensing Sensitivity: 0.9 mV

## 2021-02-25 ENCOUNTER — Ambulatory Visit: Payer: Medicare PPO | Admitting: Allergy and Immunology

## 2021-02-25 DIAGNOSIS — E032 Hypothyroidism due to medicaments and other exogenous substances: Secondary | ICD-10-CM | POA: Diagnosis not present

## 2021-02-25 DIAGNOSIS — N1831 Chronic kidney disease, stage 3a: Secondary | ICD-10-CM | POA: Diagnosis not present

## 2021-02-25 DIAGNOSIS — I25810 Atherosclerosis of coronary artery bypass graft(s) without angina pectoris: Secondary | ICD-10-CM | POA: Diagnosis not present

## 2021-02-25 DIAGNOSIS — I480 Paroxysmal atrial fibrillation: Secondary | ICD-10-CM | POA: Diagnosis not present

## 2021-02-25 DIAGNOSIS — Z23 Encounter for immunization: Secondary | ICD-10-CM | POA: Diagnosis not present

## 2021-02-25 DIAGNOSIS — R82998 Other abnormal findings in urine: Secondary | ICD-10-CM | POA: Diagnosis not present

## 2021-02-25 DIAGNOSIS — Z0000 Encounter for general adult medical examination without abnormal findings: Secondary | ICD-10-CM | POA: Diagnosis not present

## 2021-02-25 DIAGNOSIS — I350 Nonrheumatic aortic (valve) stenosis: Secondary | ICD-10-CM | POA: Diagnosis not present

## 2021-02-25 DIAGNOSIS — E785 Hyperlipidemia, unspecified: Secondary | ICD-10-CM | POA: Diagnosis not present

## 2021-02-25 DIAGNOSIS — I130 Hypertensive heart and chronic kidney disease with heart failure and stage 1 through stage 4 chronic kidney disease, or unspecified chronic kidney disease: Secondary | ICD-10-CM | POA: Diagnosis not present

## 2021-02-25 DIAGNOSIS — D692 Other nonthrombocytopenic purpura: Secondary | ICD-10-CM | POA: Diagnosis not present

## 2021-02-28 NOTE — Progress Notes (Signed)
Remote pacemaker transmission.   

## 2021-03-04 ENCOUNTER — Other Ambulatory Visit: Payer: Self-pay

## 2021-03-04 ENCOUNTER — Encounter: Payer: Self-pay | Admitting: Allergy and Immunology

## 2021-03-04 ENCOUNTER — Ambulatory Visit: Payer: Medicare PPO | Admitting: Allergy and Immunology

## 2021-03-04 VITALS — BP 140/76 | HR 80 | Temp 98.4°F | Resp 16 | Ht 68.0 in | Wt 190.1 lb

## 2021-03-04 DIAGNOSIS — J3089 Other allergic rhinitis: Secondary | ICD-10-CM | POA: Diagnosis not present

## 2021-03-04 DIAGNOSIS — J4520 Mild intermittent asthma, uncomplicated: Secondary | ICD-10-CM

## 2021-03-04 DIAGNOSIS — K219 Gastro-esophageal reflux disease without esophagitis: Secondary | ICD-10-CM

## 2021-03-04 MED ORDER — ALBUTEROL SULFATE HFA 108 (90 BASE) MCG/ACT IN AERS: INHALATION_SPRAY | RESPIRATORY_TRACT | 3 refills | Status: DC

## 2021-03-04 MED ORDER — PANTOPRAZOLE SODIUM 40 MG PO TBEC: 40.0000 mg | DELAYED_RELEASE_TABLET | Freq: Every day | ORAL | 11 refills | Status: DC

## 2021-03-04 MED ORDER — LORATADINE 10 MG PO TABS: 10.0000 mg | ORAL_TABLET | Freq: Two times a day (BID) | ORAL | 11 refills | Status: DC | PRN

## 2021-03-04 MED ORDER — IPRATROPIUM BROMIDE 0.03 % NA SOLN: 2.0000 | Freq: Two times a day (BID) | NASAL | 11 refills | Status: AC

## 2021-03-04 NOTE — Progress Notes (Signed)
[facility] [facility] [place] [facility] [place] [facility] [place] [facility] [place]   Follow-up Note  Referring Provider: Chilton Greathouse, MD Primary Provider: Chilton Greathouse, MD Date of Office Visit: 03/04/2021  Subjective:   Jorge Cherry (DOB: 03/05/1936) is a 85 y.o. male who [facility] in re-evaluation of the following:  HPI: Tivon returns to this clinic in evaluation of asthma, allergic rhinitis, LPR and hearing deficits.  His last visit to this clinic was 05 March 2020.  He has really done well since his last visit regarding his asthma and he rarely uses any short acting bronchodilator and he can exert himself to the extent that he so desires without any difficulty and has not required any systemic steroid to treat an exacerbation while he continues to utilize just a short acting bronchodilator as needed.  He has had very little problems with his nose.  Occasionally gets some runny nose.  This is not a particularly big issue for him and he will intermittently use some nasal ipratropium but this is a relatively rare requirement.  He does not require any antibiotics to treat an episode of sinusitis since his last visit.  His reflux is under very good control while using pantoprazole on a consistent basis.  He did visit with ENT and an audiologist regarding his hearing issue and had a new hearing aid prescribed but he still having significant problems with his hearing and tinnitus.  He has had the flu vaccine.  Allergies as of 03/04/2021       Reactions   Altace [ramipril] Swelling, Other (See Comments)   Mouth swelling   Mucinex [guaifenesin Er] Hives, Swelling, Other (See Comments)   Mouth swelling   Contrast Media [iodinated Diagnostic Agents] Other (See Comments)   Made eyes change each time        Medication List    albuterol 108 (90 Base) MCG/ACT inhaler Commonly known as: VENTOLIN HFA 2 inhalations every 4 - 6 hours as needed    amLODipine 2.5 MG tablet Commonly known as: NORVASC TAKE 1 TABLET BY MOUTH TWICE A DAY   atorvastatin 20 MG tablet Commonly known as: LIPITOR Take 1 tablet (20 mg total) by mouth at bedtime.   Centrum Silver 50+Men Tabs Take 1 tablet by mouth at bedtime.   Eliquis 5 MG Tabs tablet Generic drug: apixaban TAKE 1 TABLET BY MOUTH TWICE A DAY   ezetimibe 10 MG tablet Commonly known as: ZETIA TAKE 1 TABLET BY MOUTH EVERY DAY   fenofibrate 145 MG tablet Commonly known as: TRICOR TAKE 1/2 TABLETS BY MOUTH DAILY.   Fish Oil 1000 MG Caps Take 1,000 mg by mouth 2 (two) times daily.   fluticasone 50 MCG/ACT nasal spray Commonly known as: FLONASE Place 2 sprays into both nostrils daily as needed for allergies or rhinitis.   furosemide 20 MG tablet Commonly known as: LASIX Take 2 tablets (40 mg total) by mouth daily.   ipratropium 0.03 % nasal spray Commonly known as: ATROVENT Place 2 sprays into both nostrils 2 (two) times daily. Started by: Jessica Priest, MD   isosorbide mononitrate 60 MG 24 hr tablet Commonly known as: IMDUR TAKE 1 AND 1/2 TABS (90MG ) BY MOUTH EVERY MORNING AND 1/2 TAB EVERY EVENING   levothyroxine 25 MCG tablet Commonly known as: SYNTHROID Take 25 mcg by mouth daily before breakfast.   loratadine 10 MG tablet Commonly known as: CLARITIN Take 1 tablet (10 mg total) by mouth 2 (two)  times daily as needed for allergies (Can use an extra dose during flares).   metoprolol succinate 50 MG 24 hr tablet Commonly known as: TOPROL-XL Take 0.5 tablets (25 mg total) by mouth daily. Take with or immediately following a meal.   nitroGLYCERIN 0.4 MG/SPRAY spray Commonly known as: NITROLINGUAL PLACE 1 SPRAY UNDER THE TONGUE EVERY 5 (FIVE) MINUTES X 3 DOSES AS NEEDED FOR CHEST PAIN.   pantoprazole 40 MG tablet Commonly known as: PROTONIX Take 1 tablet (40 mg total) by mouth daily.   ranolazine 500 MG 12 hr tablet Commonly known as: RANEXA TAKE 1 TABLET BY  MOUTH TWICE A DAY   vitamin C 500 MG tablet Commonly known as: ASCORBIC ACID Take 500 mg by mouth at bedtime.   vitamin E 180 MG (400 UNITS) capsule Take 400 Units by mouth daily.    Past Medical History:  Diagnosis Date   Arthritis    "just a touch in my hands" (11/24/2016)   Asthma    Atherosclerosis of renal artery (HCC)    RENAL DOPPLER, 12/10/2011 - Left renal artery demonstrated narrowing with elevated velocities consistent with a 1-59% diameter reduction   CKD (chronic kidney disease) stage 3, GFR 30-59 ml/min (HCC)    "stable now since they backed off the water pills" (11/24/2016)   Coronary artery disease    a. 1994 s/p CABG x 4 (LIMA-LAD, VG->D2, VG->OM, VG->RCA); b. 02/2004 PCI SVG-D2 (3.5x16 Taxus DES). VG->RCA 100. Sev apical LAD dzs distal to LIMA insertion; c. 11/2016 Cath: LAD 95p/151m, D2 100ost, LCX 100ost, 70p/m, RCA 100p/m, RPDA fills via L->R collats. VG->RCA 100, VG->D2 20 ost, patent prox stent, 59m, LIMA->LAD ok, VG->OM3 20p. EF 55%-->Med Rx.   GERD (gastroesophageal reflux disease)    High cholesterol    History of lower GI bleeding    a. 09/2014 GIB due to diverticulosis/diverticulitis.   Iron deficiency anemia    Labile Hypertension    Moderate aortic stenosis    a. 10/2011 Echo:  EF >55%, mild-mod TR, mild-mod AS, mod Ca2+ of AoV leaflets; b. 07/2016 Echo: EF 60-65%, no rwma, Gr1 DD, mod AS [(S) mean grad , peak grad . Valve area (VTI): 1.33cm^2, (Vmax) 1.44cm^2. Mild MR]; c. 02/2018 Echo: EF 55-60%, no rwma, GR1 DD, Mod AS [Peak Vel (S): 319cm/s, Mean grad (S) , peak grad (S) 64mmHg].   PAF (paroxysmal atrial fibrillation) (HCC)    a. CHA2DS2VASc = 4-->Eliquis.   S/P TAVR (transcatheter aortic valve replacement) 11/14/2019   s/p TAVR with a 26 mm Edwards S3U via the TF approach by Drs Clifton James & Bartle   Sleep apnea     Past Surgical History:  Procedure Laterality Date   CARDIAC CATHETERIZATION  02/27/2004   Coronary intervention and  medical management   CARDIAC CATHETERIZATION  11/24/2016   CARDIOVERSION N/A 12/20/2019   Procedure: CARDIOVERSION;  Surgeon: Chilton Si, MD;  Location[facility] ENDOSCOPY;  Service: Cardiovascular;  Laterality: N/A;   CARDIOVERSION N/A 02/05/2020   Procedure: CARDIOVERSION;  Surgeon: Wendall Stade, MD;  Location: [facility] ENDOSCOPY;  Service: Cardiovascular;  Laterality: N/A;   CATARACT EXTRACTION W/ INTRAOCULAR LENS IMPLANT Left    CORONARY ANGIOPLASTY  1994 X 2   "before bypass surgery"   CORONARY ANGIOPLASTY WITH STENT PLACEMENT  03/04/2004   SVG supplying the diagonal vessel stented with a 3.5x52mm Taxus stent post dilated to 4.0 mm   CORONARY ARTERY BYPASS GRAFT  1994   "CABG X4"   INGUINAL HERNIA REPAIR Right    PACEMAKER  IMPLANT N/A 11/20/2019   Procedure: PACEMAKER IMPLANT;  Surgeon: Duke Salvia, MD;  Location: [facility] INVASIVE CV LAB;  Service: Cardiovascular;  Laterality: N/A;   RIGHT HEART CATH AND CORONARY/GRAFT ANGIOGRAPHY N/A 11/24/2016   Procedure: Right Heart Cath and Coronary/Graft Angiography;  Surgeon: Lennette Bihari, MD;  Location: [facility] INVASIVE CV LAB;  Service: Cardiovascular;  Laterality: N/A;   RIGHT/LEFT HEART CATH AND CORONARY/GRAFT ANGIOGRAPHY N/A 10/18/2019   Procedure: RIGHT/LEFT HEART CATH AND CORONARY/GRAFT ANGIOGRAPHY;  Surgeon: Lennette Bihari, MD;  Location: [facility] INVASIVE CV LAB;  Service: Cardiovascular;  Laterality: N/A;   TEE WITHOUT CARDIOVERSION N/A 11/14/2019   Procedure: TRANSESOPHAGEAL ECHOCARDIOGRAM (TEE);  Surgeon: Kathleene Hazel, MD;  Location: [facility] INVASIVE CV LAB;  Service: Open Heart Surgery;  Laterality: N/A;   TONSILLECTOMY AND ADENOIDECTOMY     TRANSCATHETER AORTIC VALVE REPLACEMENT, TRANSFEMORAL N/A 11/14/2019   Procedure: TRANSCATHETER AORTIC VALVE REPLACEMENT, TRANSFEMORAL;  Surgeon: Kathleene Hazel, MD;  Location: [facility] INVASIVE CV LAB;  Service: Open Heart Surgery;  Laterality: N/A;    Review of systems negative except as noted in HPI /  PMHx or noted below:  Review of Systems  Constitutional: Negative.   HENT: Negative.    Eyes: Negative.   Respiratory: Negative.    Cardiovascular: Negative.   Gastrointestinal: Negative.   Genitourinary: Negative.   Musculoskeletal: Negative.   Skin: Negative.   Neurological: Negative.   Endo/Heme/Allergies: Negative.   Psychiatric/Behavioral: Negative.      Objective:   Vitals:   03/04/21 1131  BP: 140/76  Pulse: 80  Resp: 16  Temp: 98.4 F (36.9 C)  SpO2: 96%   Height: 5\' 8"  (172.7 cm)  Weight: 190 lb 2 oz (86.2 kg)   Physical Exam Constitutional:      Appearance: He is not diaphoretic.  HENT:     Head: Normocephalic.     Right Ear: External ear normal.     Left Ear: External ear normal.     Ears:     Comments: Bilateral hearing aid    Nose: Nose normal. No mucosal edema or rhinorrhea.     Mouth/Throat:     Pharynx: Uvula midline. No oropharyngeal exudate.  Eyes:     Conjunctiva/sclera: Conjunctivae normal.  Neck:     Thyroid: No thyromegaly.     Trachea: Trachea normal. No tracheal tenderness or tracheal deviation.  Cardiovascular:     Rate and Rhythm: Normal rate and regular rhythm.     Heart sounds: S1 normal and S2 normal. Murmur (Systolic) heard.  Pulmonary:     Effort: No respiratory distress.     Breath sounds: Normal breath sounds. No stridor. No wheezing or rales.  Lymphadenopathy:     Head:     Right side of head: No tonsillar adenopathy.     Left side of head: No tonsillar adenopathy.     Cervical: No cervical adenopathy.  Skin:    Findings: No erythema or rash.     Nails: There is no clubbing.  Neurological:     Mental Status: He is alert.    Diagnostics: none  Assessment and Plan:   1. Asthma, mild intermittent, well-controlled   2. Perennial allergic rhinitis   3. LPRD (laryngopharyngeal reflux disease)     1.  Continue pantoprazole 40 mg daily  2.  If needed use the following medications:    A. Albuterol HFA 2  inhalations every 4-6 hours   B. OTC antihistamine & nasal saline  C.  Nasal ipratropium 0.06%  2 sprays each nostril every 6 hours  3. Return to clinic in 12 months or earlier if problem   4. Obtain Covid booster vaccine    Dayshaun is doing very well regarding his reflux issue and for his respiratory issue and I see no need for changing his therapy at this point in time.  He certainly needs to get his hearing deficit a little better under control with attention from his ENT and his audiologist.  I will see him back in this clinic in 1 year or earlier if there is a problem.  Laurette Schimke, MD Allergy / Immunology [facility]

## 2021-03-04 NOTE — Patient Instructions (Addendum)
  1.  Continue pantoprazole 40 mg daily  2.  If needed use the following medications:    A. Albuterol HFA 2 inhalations every 4-6 hours   B. OTC antihistamine & nasal saline  C.  Nasal ipratropium 0.06% 2 sprays each nostril every 6 hours  3. Return to clinic in 12 months or earlier if problem   4. Obtain Covid booster vaccine

## 2021-03-05 ENCOUNTER — Encounter: Payer: Self-pay | Admitting: Allergy and Immunology

## 2021-03-13 DIAGNOSIS — S61412A Laceration without foreign body of left hand, initial encounter: Secondary | ICD-10-CM | POA: Diagnosis not present

## 2021-03-14 ENCOUNTER — Telehealth: Payer: Self-pay | Admitting: Cardiovascular Disease

## 2021-03-14 NOTE — Telephone Encounter (Signed)
Wife of patient called. She wanted to speak to someone regarding the patient's CPAP machine

## 2021-03-14 NOTE — Telephone Encounter (Signed)
Patient's wife (okay per DPR) states that patient's CPAP is not working correctly and thinks that there might be a recall on his CPAP machine. Patient's wife states that the patient is complaining about the pressure being too high and would like the sleep coordinator to see if she can help with this issue. Advised I would forward the message to her. Patients wife verbalized understanding.

## 2021-03-19 ENCOUNTER — Encounter: Payer: Self-pay | Admitting: Cardiovascular Disease

## 2021-03-19 ENCOUNTER — Other Ambulatory Visit: Payer: Self-pay

## 2021-03-19 ENCOUNTER — Ambulatory Visit: Payer: Medicare PPO | Admitting: Cardiovascular Disease

## 2021-03-19 DIAGNOSIS — I4811 Longstanding persistent atrial fibrillation: Secondary | ICD-10-CM

## 2021-03-19 DIAGNOSIS — Z7901 Long term (current) use of anticoagulants: Secondary | ICD-10-CM

## 2021-03-19 DIAGNOSIS — I5032 Chronic diastolic (congestive) heart failure: Secondary | ICD-10-CM

## 2021-03-19 DIAGNOSIS — E785 Hyperlipidemia, unspecified: Secondary | ICD-10-CM

## 2021-03-19 DIAGNOSIS — I25708 Atherosclerosis of coronary artery bypass graft(s), unspecified, with other forms of angina pectoris: Secondary | ICD-10-CM | POA: Diagnosis not present

## 2021-03-19 DIAGNOSIS — Z952 Presence of prosthetic heart valve: Secondary | ICD-10-CM | POA: Diagnosis not present

## 2021-03-19 DIAGNOSIS — G4733 Obstructive sleep apnea (adult) (pediatric): Secondary | ICD-10-CM

## 2021-03-19 DIAGNOSIS — Z950 Presence of cardiac pacemaker: Secondary | ICD-10-CM | POA: Diagnosis not present

## 2021-03-19 DIAGNOSIS — I4819 Other persistent atrial fibrillation: Secondary | ICD-10-CM

## 2021-03-19 DIAGNOSIS — R600 Localized edema: Secondary | ICD-10-CM | POA: Diagnosis not present

## 2021-03-19 MED ORDER — FUROSEMIDE 40 MG PO TABS: ORAL_TABLET | ORAL | 3 refills | Status: DC

## 2021-03-19 MED ORDER — ISOSORBIDE MONONITRATE ER 60 MG PO TB24
ORAL_TABLET | ORAL | 3 refills | Status: DC
Start: 2021-03-19 — End: 2021-07-01

## 2021-03-19 NOTE — Patient Instructions (Signed)
Medication Instructions:  Take isosorbide mononitrate 120 mg (2 tablets) in the morning. Take isosorbide mononitrate 60 mg (1 tablet) in the evening.  Take furosemide 40 mg (1 tablet) in the morning. You may take an extra 20 mg (1/2 tablet ) in the evening for swelling as needed.  *If you need a refill on your cardiac medications before your next appointment, please call your pharmacy*  Follow-Up: At [facility], you and your health needs are our priority.  As part of our continuing mission to provide you with exceptional heart care, we have created designated Provider Care Teams.  These Care Teams include your primary Cardiologist (physician) and Advanced Practice Providers (APPs -  Physician Assistants and Nurse Practitioners) who all work together to provide you with the care you need, when you need it.  We recommend signing up for the patient portal called "MyChart".  Sign up information is provided on this After Visit Summary.  MyChart is used to connect with patients for Virtual Visits (Telemedicine).  Patients are able to view lab/test results, encounter notes, upcoming appointments, etc.  Non-urgent messages can be sent to your provider as well.   To learn more about what you can do with MyChart, go to [email or web address].    Your next appointment:   4 month(s)  The format for your next appointment:   In Person  Provider:   Nicki Guadalajara, MD

## 2021-03-19 NOTE — Progress Notes (Signed)
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 Does not work   [employer] [Iodinated Diagnostic Agents] Other (See Comments)    Made eyes change each time    Current Outpatient Medications  Medication Sig Dispense Refill   albuterol (VENTOLIN HFA) 108 (90 Base) MCG/ACT inhaler 2 inhalations every 4 - 6 hours as needed 8.5 each 3   amLODipine (NORVASC) 2.5 MG tablet TAKE 1 TABLET BY MOUTH TWICE A DAY 180 tablet 1   atorvastatin (LIPITOR) 20 MG tablet Take 1 tablet (20 mg total) by mouth at bedtime. 30 tablet 0   ELIQUIS 5 MG TABS tablet TAKE 1 TABLET BY MOUTH TWICE A DAY 60 tablet 5   ezetimibe (ZETIA) 10 MG tablet TAKE 1 TABLET BY MOUTH EVERY DAY 90 tablet 3   fenofibrate (TRICOR) 145 MG tablet TAKE 1/2 TABLETS BY MOUTH DAILY. 45 tablet 11   fluticasone (FLONASE) 50 MCG/ACT nasal spray Place 2 sprays into both nostrils daily as needed for allergies or rhinitis.      furosemide (LASIX) 40 MG tablet Take 1 tablet (40 mg) in the morning. You may take an extra 1/2 tablet (20 mg) in the evening as needed for swelling. 90 tablet 3   ipratropium (ATROVENT) 0.03 % nasal spray Place 2 sprays into both nostrils 2 (two) times daily. 30 mL 11   isosorbide mononitrate (IMDUR) 60 MG 24 hr tablet Take 2 tablets in the morning. Take 1 tablet in the evening. 90  tablet 3   levothyroxine (SYNTHROID) 25 MCG tablet Take 25 mcg by mouth daily before breakfast.      loratadine (CLARITIN) 10 MG tablet Take 1 tablet (10 mg total) by mouth 2 (two) times daily as needed for allergies (Can use an extra dose during flares). 60 tablet 11   Multiple Vitamins-Minerals (CENTRUM SILVER 50+MEN) TABS Take 1 tablet by mouth at bedtime.     nitroGLYCERIN (NITROLINGUAL) 0.4 MG/SPRAY spray PLACE 1 SPRAY UNDER THE TONGUE EVERY 5 (FIVE) MINUTES X 3 DOSES AS NEEDED FOR CHEST PAIN. 12 g 1   Omega-3 Fatty Acids (FISH OIL) 1000 MG CAPS Take 1,000 mg by mouth 2 (two) times daily.      pantoprazole (PROTONIX) 40 MG tablet Take 1 tablet (40 mg total) by mouth daily. 30 tablet 11   ranolazine (RANEXA) 500 MG 12 hr tablet TAKE 1 TABLET BY MOUTH TWICE A DAY 180 tablet 2   vitamin C (  ASCORBIC ACID) 500 MG tablet Take 500 mg by mouth at bedtime.      vitamin E 400 UNIT capsule Take 400 Units by mouth daily.     metoprolol succinate (TOPROL-XL) 50 MG 24 hr tablet Take 0.5 tablets (25 mg total) by mouth daily. Take with or immediately following a meal. 90 tablet 1   No current facility-administered medications for this visit.    Socially he is married and has 2 children and 4 grandchildren. There is no tobacco alcohol use. Recently he was has been very active.  ROS General: Negative; No fevers, chills, or night sweats;  HEENT: He is hard of hearing; A new complaint is that of ringing in his ears; no visual changes, sinus congestion, difficulty swallowing Pulmonary: Negative; No cough, wheezing, shortness of breath, hemoptysis Cardiovascular: See HPI GI: Positive for recent GI bleed secondary to diverticular disease GU: Negative; No dysuria, hematuria, or difficulty voiding Musculoskeletal: Negative; no myalgias, joint pain, or weakness Hematologic/Oncology: Negative; no easy bruising, bleeding Endocrine: Negative; no heat/cold intolerance; no diabetes Neuro: Negative; no changes in  balance, headaches Skin: Negative; No rashes or skin lesions Psychiatric: Negative; No behavioral problems, depression Sleep: Positive for OSA with history of snoring, daytime sleepiness, no bruxism, restless legs, hypnogognic hallucinations, no cataplexy Other comprehensive 14 point system review is negative.  PE BP 140/88   Pulse (!) 55   Ht _0  (1.727 m)   Wt 192 lb 3.2 oz (87.2 kg)   SpO2 97%   BMI 29.22 kg/m    Repeat blood pressure by me was 138/88  Wt Readings from Last 3 Encounters:  03/19/21 192 lb 3.2 oz (87.2 kg)  03/04/21 190 lb 2 oz (86.2 kg)  11/07/20 185 lb 4.8 oz (84.1 kg)   General: Alert, oriented, no distress.  Skin: normal turgor, no rashes, warm and dry HEENT: Normocephalic, atraumatic. Pupils equal round and reactive to light; sclera anicteric; extraocular muscles intact;  Nose without nasal septal hypertrophy Mouth/Parynx benign; Mallinpatti scale 3 Neck: No JVD, no carotid bruits; normal carotid upstroke Lungs: clear to ausculatation and percussion; no wheezing or rales Chest wall: without tenderness to palpitation Heart: PMI not displaced, RRR, s1 s2 normal, 2/6 systolic murmur, no diastolic murmur, no rubs, gallops, thrills, or heaves Abdomen: soft, nontender; no hepatosplenomehaly, BS+; abdominal aorta nontender and not dilated by palpation. Back: no CVA tenderness Pulses 2+ Musculoskeletal: full range of motion, normal strength, no joint deformities Extremities: 2+ right lower extremity edema, 1+ left lower extremity edema; no clubbing cyanosis or edema, Homan's sign negative  Neurologic: grossly nonfocal; Cranial nerves grossly wnl Psychologic: Normal mood and affect   March 19, 2021 ECG (independently read by me): V paced at 55. Underlying AF   November 05, 2020 ECG (independently read by me):  Atrial fibrillation at 76, LVH with QRS widening, LAD;  March 3,2022 ECG (independently read by me): Atrial fibrillation at 64, QTc 480 msec  ECG  (independently read by me): Atrial fibrillation at 62 with a competing junctional pacemaker, Left axis deviation, QTc 460 msec  March 2020 ECG (independently read by me): Sinus rhythm at 71 bpm.  First-degree block with 212 ms.  Right bundle branch block with repolarization changes.  Left anterior hemiblock.  January 26, 2018 ECG (independently read by me): Sinus bradycardia at 47 bpm with first-degree AV block.  PR interval 218 ms.  LVH with QRS widening.  Left axis deviation.  October 28, 2017 ECG (independently read by me): Atrial fibrillation with variable  rate that seems to be more in the 50s to 60s but following a pause drops into the 40s  August 26, 2017 ECG (independently read by me): atrial fibrillation at 56 bpm.  Nonspecific ST changes.  QTc interval 476 ms.  February 2019 ECG (independently read by me): Atrial fibrillation at 71 bpm.  LVH by voltage in aVL.  Nonspecific T changes.  04/27/2017 ECG (independently read by me): Normal sinus rhythm at 60 bpm.  Nonspecific intraventricular block.  Anterior T-wave abnormality  03/23/2017 ECG (independently read by me): atrial fibrillation at 64 bpm.  Nonspecific interventricular block.  Nonspecific T wave abnormality.  02/17/2017 ECG (independently read by me): Atrial fibrillation at 72 bpm.  RV conduction delay.  QTc interval 453 ms.  01/29/2017 ECG (independently read by me): Atrial fibrillation at 56 bpm.  LVH by voltage criteria.  Nonspecific ST changes.  QTc interval 430 ms.  August 2018 ECG (independently read by me): Sinus bradycardia 59 bpm.  LVH by voltage.  QTc interval 469 ms.  No significant ST-T changes.  April 2018 ECG (independently read by me): Sinus rhythm at 61 bpm.  RV conduction delay.  LVH.  March 2018 ECG (independently read by me): Atrial fibrillation at 71 bpm.  RV conduction delay.  QTc interval 456 ms.  Left axis deviation.  February 2018 ECG (independently read by me): Normal sinus rhythm at 60 bpm.  LVH.  QTc  interval at 464 ms.  Nonspecific T abnormality  December 2017 ECG (independently read by me):  Sinus bradycardia 57 bpm.  LVH by voltage. No significant ST changes.  January 10, 2016 ECG (independently read by me): Normal sinus rhythm at 64 bpm.  LVH.  QTc interval 468 ms.  December 2016 ECG (independently read by me):  Sinus bradycardia 51 bpm.  No ectopy. QTc interval 429 ms.  LVH by voltage criteria in aVL.  September 2016 ECG (independently read by me):  Sinus bradycardia with first-degree AV block. RV conduction delay. No significant ST-T changes.   August 2016 ECG (independently read by me): Sinus bradycardia 57 bpm.  Mild RV conduction delay.  May 2016 ECG (independently read by me): Sinus bradycardia at 49 bpm with first-degree AV block with a PR interval 218 ms.  Right reticular conduction delay.  December 2015 ECG (independently read by me).  For these: Sinus bradycardia 57 bpm.  First-degree AV block with a PR interval at 224 ms.  No significant ST-T changes.  March 2015 ECG (independently read by me): Sinus rhythm at 59 beats per minute. Mild LVH by voltage criteria in aVL. Normal intervals.  01/05/2013 ECG: Normal sinus rhythm at 62. Mild RV conduction delay. PR interval 204 ms, QTc interval 452 ms.  LABS: BMP Latest Ref Rng & Units 11/07/2020 02/05/2020 01/03/2020  Glucose 65 - 99 mg/dL 81 94 97  BUN 8 - 27 mg/dL _0 Creatinine 0.76 - 1.27 mg/dL 1.53(H) 1.43(H) 1.36(H)  BUN/Creat Ratio 10 - 24 14 - 14  Sodium 134 - 144 mmol/L 143 140 143  Potassium 3.5 - 5.2 mmol/L 4.2 3.8 3.9  Chloride 96 - 106 mmol/L 107(H) 112(H) 108(H)  CO2 20 - 29 mmol/L _1 Calcium 8.6 - 10.2 mg/dL 9.3 9.0 8.8   Hepatic Function Latest Ref Rng & Units 11/07/2020 11/10/2019 03/14/2018  Total Protein 6.0 - 8.5 g/dL 6.5 5.8(L) -  Albumin 3.6 - 4.6 g/dL 4.2 3.3(L) 2.2(L)  AST 0 - 40 IU/L 24 41 -  ALT 0 -  44 IU/L 18 50(H) -  Alk Phosphatase 44 - 121 IU/L 62 45 -  Total Bilirubin 0.0 - 1.2  mg/dL 0.4 0.9 -  Bilirubin, Direct 0.1 - 0.5 mg/dL - - -   CBC Latest Ref Rng & Units 11/07/2020 02/05/2020 01/10/2020  WBC 3.4 - 10.8 x10E3/uL 6.8 6.4 5.7  Hemoglobin 13.0 - 17.7 g/dL 13.1 12.5(L) 11.6(L)  Hematocrit 37.5 - 51.0 % 40.2 40.7 37.1(L)  Platelets 150 - 450 x10E3/uL 158 156 141(L)   Lab Results  Component Value Date   MCV 92 11/07/2020   MCV 97.1 02/05/2020   MCV 96.4 01/10/2020   Lab Results  Component Value Date   TSH 2.890 11/07/2020   Lab Results  Component Value Date   HGBA1C 5.5 11/10/2019     Lipid Panel     Component Value Date/Time   CHOL 109 11/07/2020 0000   CHOL 137 04/09/2014 1115   TRIG 111 11/07/2020 0000   TRIG 182 (H) 04/09/2014 1115   HDL 39 (L) 11/07/2020 0000   HDL 32 (L) 04/09/2014 1115   CHOLHDL 2.8 11/07/2020 0000   CHOLHDL 3.0 07/01/2016 1345   VLDL 24 07/01/2016 1345   LDLCALC 50 11/07/2020 0000   LDLCALC 69 04/09/2014 1115     RADIOLOGY: No results found.  A CPAP download was obtained in the office with reference to his obstructive sleep apnea from August 18 through January 24, 2018. He is 100% compliant with usage days and averaging 6 hours and 35 minutes of usage daily.  At his 12 cm pressure, AHI is excellent at 0.9.  He does have a mask leak.  CPAP November 10 through April 18, 2018: Compliance 97% usage days and usage greater than 4 hours.  Average use 6 hours 46 minutes.  At 12 cm pressure, AHI 1.2/h.  CPAP download June 25, 2018 through July 24, 2018.  Usage 87%, usage greater than 4 hours 83%, average usage on days used 6 hours and 34 minutes.  AHI 1.2 8012 cm pressure.  A download was obtained in the office from May 2022 through November 04, 2020.  Usage days only 63%.  Average usage is 6 hours 22 minutes.  AHI 5.0 with a auto minimum pressure 11 maximum pressure 16 with 95 percentile pressure 11.5.  There is mask leak.  IMPRESSION:  1. Coronary artery disease of bypass graft of native heart with stable angina  pectoris ([place])   2. Longstanding persistent atrial fibrillation ([place])   3. Bilateral lower extremity edema   4. OSA (obstructive sleep apnea)   5. S/P TAVR (transcatheter aortic valve replacement)   6. Pacemaker   7. Chronic diastolic CHF (congestive heart failure) ([place])   8. Hyperlipidemia LDL goal <70   9. Chronic anticoagulation      ASSESSMENT AND PLAN: Mr. Zetina is an 85 year-old  male who is status post CABG revascularization surgery in 1994.  Subsequently he underwent DES stenting to the graft supplying the diagonal vessel in 2003.  In July 2017 he was hospitalized with atrial fibrillation in the setting of bradycardia and hypotension leading to medication adjustment  An echo Doppler study of 11/21/2015 showed an EF of 55-60%, mild aortic stenosis with a valve area of 1.75 mm per there was mild pulmonary hypertension. Subsequently he had complained of experiencing some episodes of chest tightness and his symptoms improved with further titration of nitrates as well as beta blocker therapy.  He developed significant anemia, which undoubtedly exacerbated his anginal symptomatology.  At catheterization in July 2018 although his LIMA to mid LAD and vein graft to diagonal vessel are patent the very proximal LAD had a 95% stenosis which supplies a diagonal vessel, which was not supplied by the grafts.  His native RCA and graft to RCA was occluded and there is some collateralization to the distal RCA via the vein graft supplying the circumflex marginal vessel.  His anginal symptoms improved with stabilization of his hemoglobin.  Spironolactone was discontinued secondary to renal insufficiency.  For 3 years he was treated with medication for his class II anginal symptomatology.  However in June 2021 anginal symptoms progressed.  At that time repeat catheterization revealed similar CAD but progressive aortic valve stenosis.  He underwent successful TAVR with a 26 mm sapient 3 ultra TH VV the transfemoral  approach on November 14, 2019.  Subsequently he developed several syncopal spells was found to have prolonged pauses of 8 and 11 seconds for which she required insertion of a dual-chamber Medtronic permanent pacemaker by Dr. Caryl Comes on November 20, 2019.  He had had recurrent episodes of atrial fibrillation and despite cardioversion return back to atrial fibrillation.  He is now in persistent long-term atrial fibrillation.  Amiodarone was discontinued . Since he had concomitant CAD I attempted to further titrate Ranexa to 1000 twice daily since he was no longer on amiodarone but apparently he believes that this may have contributed to nausea and possibly ringing in his ears.  As result he is only been taking 500 mg twice daily as previously recommended.  Presently, he continues to be in atrial fibrillation with a controlled ventricular response.  He is on Eliquis for anticoagulation and is without bleeding.  Presently, he admits to increased stress on the home front.  When he overexerts himself he has experienced some exertional chest pain symptomatology which has not been progressive.  His blood pressure today is elevated on amlodipine 2.5 mg which he has been taking twice a day, furosemide for which she is only been taking 20 mg daily in addition to his isosorbide 90 mg in the morning and 30 mg at night and metoprolol succinate 25 mg daily.  I have recommended further titration of his morning dose of isosorbide to 120 mg in the morning and he will continue his 30 mg evening dose.  With his significant lower extremity edema right greater than left I have recommended that he take furosemide 40 mg every morning.  If there are days where there is still significant swelling in the afternoon he can take 20 mg in the afternoon.  He continues to be on atorvastatin 20 mg, Zetia 10 mg, and fenofibrate for his mixed hyperlipidemia.  His atrial fibrillation is controlled and his ECG today shows a paced rhythm with his underlying AF.  He  continues to be on low-dose levothyroxine at 25 mcg.  I reviewed his CPAP data with him in detail.  We have contacted his DME company.  I have provided him with a new sample mask which he states he was having difficulty with his prior mask and have given him an AirTouch F 20 mask.  He will take his equipment to the DME company for possible new water chamber or gasket.  I will see him in 4 months for reevaluation or sooner as needed.    Troy Sine, MD, [facility]  03/21/2021 12:27 PM

## 2021-03-19 NOTE — Telephone Encounter (Signed)
Patient has appointment to be seen by Dr Tresa Endo today. Issues will be addressed during his appointment.

## 2021-03-21 ENCOUNTER — Encounter: Payer: Self-pay | Admitting: Cardiovascular Disease

## 2021-04-10 DIAGNOSIS — H903 Sensorineural hearing loss, bilateral: Secondary | ICD-10-CM | POA: Diagnosis not present

## 2021-04-17 ENCOUNTER — Telehealth: Payer: Self-pay

## 2021-04-17 DIAGNOSIS — K869 Disease of pancreas, unspecified: Secondary | ICD-10-CM | POA: Diagnosis not present

## 2021-04-17 DIAGNOSIS — I2510 Atherosclerotic heart disease of native coronary artery without angina pectoris: Secondary | ICD-10-CM | POA: Diagnosis not present

## 2021-04-17 DIAGNOSIS — Z862 Personal history of diseases of the blood and blood-forming organs and certain disorders involving the immune mechanism: Secondary | ICD-10-CM | POA: Diagnosis not present

## 2021-04-17 NOTE — Telephone Encounter (Signed)
   [facility] Pre-operative Risk Assessment    Patient Name: Jorge Cherry  DOB: 03-20-1936 MRN: [id number]  HEARTCARE STAFF:  - IMPORTANT!!!!!! Under Visit Info/Reason for Call, type in Other and utilize the format Clearance MM/DD/YY or Clearance TBD. Do not use dashes or single digits. - Please review there is not already an duplicate clearance open for this procedure. - If request is for dental extraction, please clarify the # of teeth to be extracted. - If the patient is currently at the dentist's office, call Pre-Op Callback Staff (MA/nurse) to input urgent request.  - If the patient is not currently in the dentist office, please route to the Pre-Op pool.  Request for surgical clearance:  What type of surgery is being performed? Endoscopy   When is this surgery scheduled? 05/15/21  What type of clearance is required (medical clearance vs. Pharmacy clearance to hold med vs. Both)? Both   Are there any medications that need to be held prior to surgery and how long? Eliquis hold for 2 days   Practice name and name of physician performing surgery? [facility], Dr. Paulita Fujita  What is the office phone number? [phone]   7.   What is the office fax number? [phone]  8.   Anesthesia type (None, local, MAC, general) ? Propofol    Jacqulynn Cadet 04/17/2021, 4:21 PM  _________________________________________________________________   (provider comments below)

## 2021-04-18 NOTE — Telephone Encounter (Signed)
Patient with diagnosis of A fib s/p TAVR on Eliquis for anticoagulation.    Patient has appt with Dr Graciela Husbands on 12/14.  Note: last SCR 1.53 on 11/07/20.  Currently on incorrect Eliquis dose.  Recommend updating BMP to confirm Eliquis dosing (5mg  BID vs 2.5mg  BID)  Procedure: endoscopy Date of procedure: 05/15/21   CHA2DS2-VASc Score = 5  This indicates a 7.2% annual risk of stroke. The patient's score is based upon: CHF History: 1 HTN History: 1 Diabetes History: 0 Stroke History: 0 Vascular Disease History: 1 Age Score: 2 Gender Score: 0   CrCl 45 mL/min  Platelet count 158K  Per office protocol, patient can hold Eliquis for 2 days prior to procedure.

## 2021-04-18 NOTE — Telephone Encounter (Signed)
Noted, this information will now be available for Dr. Graciela Husbands to reference at upcoming OV. I also see where Thayer Ohm added "needs BMET" to appt notes. This note was already routed to Dr. Graciela Husbands as below for review at 12/14 OV.

## 2021-04-18 NOTE — Telephone Encounter (Addendum)
   Patient Name: Jorge Cherry  DOB: 1936/01/03 MRN: [id number]  Primary Cardiologist: Nicki Guadalajara, MD / EP - Dr. Graciela Husbands  Chart reviewed as part of pre-operative protocol coverage. Very complex PMH. Seen by Dr. Tresa Endo 03/19/21 with adjustment in his medication regimen with planned f/u in 4 months. He also is due to see Dr. Graciela Husbands. The patient has an upcoming appointment scheduled 04/23/21 with Dr. Graciela Husbands at which time this clearance can be addressed in case there are any issues that would impact surgical clearance. I added preop FYI to appointment notes so that provider is aware to address at time of OV.  Will also cc this request to Dr. Farrel Conners nurse so he they are aware.  Per office protocol, the cardiology provider should forward their finalized clearance decision to requesting party below. I will route this message as FYI to requesting party and remove this message from the pre-op box as separate preop APP input not needed at this time.  Will also route to our pharm team to address anticoagulation recommendation so that Dr. Graciela Husbands can reference this at time of OV.  Please call with questions.  Laurann Montana, PA-C 04/18/2021, 11:17 AM

## 2021-04-22 DIAGNOSIS — L570 Actinic keratosis: Secondary | ICD-10-CM | POA: Diagnosis not present

## 2021-04-22 DIAGNOSIS — L821 Other seborrheic keratosis: Secondary | ICD-10-CM | POA: Diagnosis not present

## 2021-04-22 DIAGNOSIS — D1801 Hemangioma of skin and subcutaneous tissue: Secondary | ICD-10-CM | POA: Diagnosis not present

## 2021-04-22 DIAGNOSIS — D485 Neoplasm of uncertain behavior of skin: Secondary | ICD-10-CM | POA: Diagnosis not present

## 2021-04-22 DIAGNOSIS — D225 Melanocytic nevi of trunk: Secondary | ICD-10-CM | POA: Diagnosis not present

## 2021-04-22 DIAGNOSIS — L814 Other melanin hyperpigmentation: Secondary | ICD-10-CM | POA: Diagnosis not present

## 2021-04-22 DIAGNOSIS — L738 Other specified follicular disorders: Secondary | ICD-10-CM | POA: Diagnosis not present

## 2021-04-23 ENCOUNTER — Encounter: Payer: Medicare PPO | Admitting: Internal Medicine

## 2021-04-23 ENCOUNTER — Other Ambulatory Visit: Payer: Self-pay

## 2021-04-23 ENCOUNTER — Ambulatory Visit: Payer: Medicare PPO | Admitting: Internal Medicine

## 2021-04-23 VITALS — BP 128/68 | HR 79 | Ht 68.0 in | Wt 190.8 lb

## 2021-04-23 DIAGNOSIS — Z79899 Other long term (current) drug therapy: Secondary | ICD-10-CM

## 2021-04-23 DIAGNOSIS — I4819 Other persistent atrial fibrillation: Secondary | ICD-10-CM

## 2021-04-23 DIAGNOSIS — I442 Atrioventricular block, complete: Secondary | ICD-10-CM | POA: Diagnosis not present

## 2021-04-23 DIAGNOSIS — Z950 Presence of cardiac pacemaker: Secondary | ICD-10-CM | POA: Diagnosis not present

## 2021-04-23 DIAGNOSIS — R001 Bradycardia, unspecified: Secondary | ICD-10-CM | POA: Diagnosis not present

## 2021-04-23 NOTE — Patient Instructions (Signed)
Medication Instructions:  °Your physician recommends that you continue on your current medications as directed. Please refer to the Current Medication list given to you today. ° °*If you need a refill on your cardiac medications before your next appointment, please call your pharmacy* ° ° ° °Lab Work: °BMET today ° °If you have labs (blood work) drawn today and your tests are completely normal, you will receive your results only by: °MyChart Message (if you have MyChart) OR °A paper copy in the mail °If you have any lab test that is abnormal or we need to change your treatment, we will call you to review the results. ° ° °Testing/Procedures: °None ordered. ° ° ° °Follow-Up: °At [facility], you and your health needs are our priority.  As part of our continuing mission to provide you with exceptional heart care, we have created designated Provider Care Teams.  These Care Teams include your primary Cardiologist (physician) and Advanced Practice Providers (APPs -  Physician Assistants and Nurse Practitioners) who all work together to provide you with the care you need, when you need it. ° °We recommend signing up for the patient portal called "MyChart".  Sign up information is provided on this After Visit Summary.  MyChart is used to connect with patients for Virtual Visits (Telemedicine).  Patients are able to view lab/test results, encounter notes, upcoming appointments, etc.  Non-urgent messages can be sent to your provider as well.   °To learn more about what you can do with MyChart, go to [email or web address].   ° °Your next appointment:   °12 months with Dr Klein °

## 2021-04-23 NOTE — Progress Notes (Signed)
Patient Care Team: Chilton Greathouse, MD as PCP - General (Internal Medicine) Lennette Bihari, MD as PCP - Cardiology (Cardiology) Duke Salvia, MD as PCP - Electrophysiology (Cardiology) Lucie Leather Alvira Philips, MD as Consulting Physician (Allergy and Immunology)   HPI  Jorge Cherry is a 85 y.o. male seen in follow-up for right bundle branch block pre-TAVR and left bundle branch block post TAVR and syncope for which he underwent pacemaker implantation 7/21 (Medtronic) History of ischemic heart disease with prior bypass surgery 1994 most recent catheterization 7/18 with a patent LIMA, patent vein graft to OM 3 and D2 and occlusion to his RCA.  EF was normal.  Atrial fibrillation with repeated cardioversion, 8/11 and 02/05/2020 holding sinus<   24 hours this despite amiodarone;   Chose to discontinue amio  anticoagulation with Apixoban  without bleeding   He states that his heart rate and breathing has been fine. However, he states that when the weather is cool that he can experience chest tightness and pain.  He denies ever having had a stroke.  Additionally, it was found that he has kidney polyps and "something" on his pancreas. Labwork was performed in gastroenterology with no abnormal results, but endoscopy is still being performed to make sure that there was nothing unusual.  Lower leg edema noted in his right leg, but no noticeable change in this swelling since his last checkup.    LHC 6/21 patent grafts, occluded natives, EF by Echo 55-60-% DATE TEST EF   6/21   LIMA>>LAD; SVG>>OM3;SVG>>D2 patent  SVG>>RCA T  6/21 Echo   55-60 %   8/21 Echo  60%           Date Cr K Hgb  7/21/  1.39 3.9 11.4   6/22 1.53 4.2 13.0      Thromboembolic risk factors ( age  -2, HTN-1, TIA/CVA-2, Vasc disease -1) for a CHADSVASc Score of >6     Records and Results Reviewed ma'am  Past Medical History:  Diagnosis Date   Arthritis    "just a touch in my hands" (11/24/2016)   Asthma     Atherosclerosis of renal artery (HCC)    RENAL DOPPLER, 12/10/2011 - Left renal artery demonstrated narrowing with elevated velocities consistent with a 1-59% diameter reduction   CKD (chronic kidney disease) stage 3, GFR 30-59 ml/min (HCC)    "stable now since they backed off the water pills" (11/24/2016)   Coronary artery disease    a. 1994 s/p CABG x 4 (LIMA-LAD, VG->D2, VG->OM, VG->RCA); b. 02/2004 PCI SVG-D2 (3.5x16 Taxus DES). VG->RCA 100. Sev apical LAD dzs distal to LIMA insertion; c. 11/2016 Cath: LAD 95p/166m, D2 100ost, LCX 100ost, 70p/m, RCA 100p/m, RPDA fills via L->R collats. VG->RCA 100, VG->D2 20 ost, patent prox stent, 44m, LIMA->LAD ok, VG->OM3 20p. EF 55%-->Med Rx.   GERD (gastroesophageal reflux disease)    High cholesterol    History of lower GI bleeding    a. 09/2014 GIB due to diverticulosis/diverticulitis.   Iron deficiency anemia    Labile Hypertension    Moderate aortic stenosis    a. 10/2011 Echo:  EF >55%, mild-mod TR, mild-mod AS, mod Ca2+ of AoV leaflets; b. 07/2016 Echo: EF 60-65%, no rwma, Gr1 DD, mod AS [(S) mean grad , peak grad . Valve area (VTI): 1.33cm^2, (Vmax) 1.44cm^2. Mild MR]; c. 02/2018 Echo: EF 55-60%, no rwma, GR1 DD, Mod AS [Peak Vel (S): 319cm/s, Mean grad (S) , peak grad (S) 27mmHg].  PAF (paroxysmal atrial fibrillation) (HCC)    a. CHA2DS2VASc = 4-->Eliquis.   S/P TAVR (transcatheter aortic valve replacement) 11/14/2019   s/p TAVR with a 26 mm Edwards S3U via the TF approach by Drs Angelena Form & Bartle   Sleep apnea     Past Surgical History:  Procedure Laterality Date   CARDIAC CATHETERIZATION  02/27/2004   Coronary intervention and medical management   CARDIAC CATHETERIZATION  11/24/2016   CARDIOVERSION N/A 12/20/2019   Procedure: CARDIOVERSION;  Surgeon: Skeet Latch, MD;  Location: [place];  Service: Cardiovascular;  Laterality: N/A;   CARDIOVERSION N/A 02/05/2020   Procedure: CARDIOVERSION;  Surgeon: Josue Hector,  MD;  Location: [facility] ENDOSCOPY;  Service: Cardiovascular;  Laterality: N/A;   CATARACT EXTRACTION W/ INTRAOCULAR LENS IMPLANT Left    CORONARY ANGIOPLASTY  1994 X 2   "before bypass surgery"   CORONARY ANGIOPLASTY WITH STENT PLACEMENT  03/04/2004   SVG supplying the diagonal vessel stented with a 3.5x83mm Taxus stent post dilated to 4.0 mm   CORONARY ARTERY BYPASS GRAFT  1994   "CABG X4"   INGUINAL HERNIA REPAIR Right    PACEMAKER IMPLANT N/A 11/20/2019   Procedure: PACEMAKER IMPLANT;  Surgeon: Deboraha Sprang, MD;  Location: [place] CV LAB;  Service: Cardiovascular;  Laterality: N/A;   RIGHT HEART CATH AND CORONARY/GRAFT ANGIOGRAPHY N/A 11/24/2016   Procedure: Right Heart Cath and Coronary/Graft Angiography;  Surgeon: Troy Sine, MD;  Location: [place] CV LAB;  Service: Cardiovascular;  Laterality: N/A;   RIGHT/LEFT HEART CATH AND CORONARY/GRAFT ANGIOGRAPHY N/A 10/18/2019   Procedure: RIGHT/LEFT HEART CATH AND CORONARY/GRAFT ANGIOGRAPHY;  Surgeon: Troy Sine, MD;  Location: [place] CV LAB;  Service: Cardiovascular;  Laterality: N/A;   TEE WITHOUT CARDIOVERSION N/A 11/14/2019   Procedure: TRANSESOPHAGEAL ECHOCARDIOGRAM (TEE);  Surgeon: Burnell Blanks, MD;  Location: [place] CV LAB;  Service: Open Heart Surgery;  Laterality: N/A;   TONSILLECTOMY AND ADENOIDECTOMY     TRANSCATHETER AORTIC VALVE REPLACEMENT, TRANSFEMORAL N/A 11/14/2019   Procedure: TRANSCATHETER AORTIC VALVE REPLACEMENT, TRANSFEMORAL;  Surgeon: Burnell Blanks, MD;  Location: [place] CV LAB;  Service: Open Heart Surgery;  Laterality: N/A;    Current Meds  Medication Sig   albuterol (VENTOLIN HFA) 108 (90 Base) MCG/ACT inhaler 2 inhalations every 4 - 6 hours as needed   amLODipine (NORVASC) 2.5 MG tablet TAKE 1 TABLET BY MOUTH TWICE A DAY   atorvastatin (LIPITOR) 20 MG tablet Take 1 tablet (20 mg total) by mouth at bedtime.   ELIQUIS 5 MG TABS tablet TAKE 1 TABLET BY MOUTH TWICE A DAY    ezetimibe (ZETIA) 10 MG tablet TAKE 1 TABLET BY MOUTH EVERY DAY   fenofibrate (TRICOR) 145 MG tablet TAKE 1/2 TABLETS BY MOUTH DAILY.   fluticasone (FLONASE) 50 MCG/ACT nasal spray Place 2 sprays into both nostrils daily as needed for allergies or rhinitis.    furosemide (LASIX) 40 MG tablet Take 1 tablet (40 mg) in the morning. You may take an extra 1/2 tablet (20 mg) in the evening as needed for swelling.   ipratropium (ATROVENT) 0.03 % nasal spray Place 2 sprays into both nostrils 2 (two) times daily.   isosorbide mononitrate (IMDUR) 60 MG 24 hr tablet Take 2 tablets in the morning. Take 1 tablet in the evening.   levothyroxine (SYNTHROID) 25 MCG tablet Take 25 mcg by mouth daily before breakfast.    loratadine (CLARITIN) 10 MG tablet Take 1 tablet (10 mg total) by mouth 2 (  two) times daily as needed for allergies (Can use an extra dose during flares).   Multiple Vitamins-Minerals (CENTRUM SILVER 50+MEN) TABS Take 1 tablet by mouth at bedtime.   nitroGLYCERIN (NITROLINGUAL) 0.4 MG/SPRAY spray PLACE 1 SPRAY UNDER THE TONGUE EVERY 5 (FIVE) MINUTES X 3 DOSES AS NEEDED FOR CHEST PAIN.   Omega-3 Fatty Acids (FISH OIL) 1000 MG CAPS Take 1,000 mg by mouth 2 (two) times daily.    pantoprazole (PROTONIX) 40 MG tablet Take 1 tablet (40 mg total) by mouth daily.   ranolazine (RANEXA) 500 MG 12 hr tablet TAKE 1 TABLET BY MOUTH TWICE A DAY   vitamin C (ASCORBIC ACID) 500 MG tablet Take 500 mg by mouth at bedtime.    vitamin E 400 UNIT capsule Take 400 Units by mouth daily.    Allergies  Allergen Reactions   Altace [Ramipril] Swelling and Other (See Comments)    Mouth swelling   Mucinex [Guaifenesin Er] Hives, Swelling and Other (See Comments)    Mouth swelling   Azithromycin     Other reaction(s): Does not work   [place] [Iodinated Diagnostic Agents] Other (See Comments)    Made eyes change each time      Review of Systems negative except from HPI and PMH  Physical Exam There were no  vitals taken for this visit. Well developed and well nourished in no acute distress HENT normal E scleral and icterus clear Neck Supple JVP flat; carotids brisk and full Clear to ausculation Irregular rate and rhythm with a 99991111 systolic murmur not changing with RR intervals Soft with active bowel sounds No clubbing cyanosis  Edema Alert and oriented, grossly normal motor and sensory function Skin Warm and Dry  ECG atrial fibrillation with ventricular pacing at 73  CrCl cannot be calculated (Patient's most recent lab result is older than the maximum 21 days allowed.).   Assessment and  Plan  Syncope recurrent   RBBB pre tTAVR,  LBBB post TAVR   ICM, s/p CABG normal LV function   AF permanent   Pacemaker-Medtronic   HFpEF  Renal insufficiency  We will recheck a metabolic profile.  Creatinine most recently was 1.53 which at his age would prompt a down titration of his apixaban dose, for now continue the dose at 5 mg twice daily; no bleeding so we will continue his Eliquis.  No ischemia.  Continue Eliquis.  Continue isosorbide although it is an unusual dosing regime being given twice daily per Dr. Claiborne Billings will continue at 60/30 and amlodipine 2.5 mg daily.  Edema isolated to his venotomy leg        Current medicines are reviewed at length with the patient today .  The patient does not  have concerns regarding medicines.   I,Jordan Kelly,acting as a Education administrator for Virl Axe, MD.,have documented all relevant documentation on the behalf of Virl Axe, MD,as directed by  Virl Axe, MD while in the presence of Virl Axe, MD. I, Virl Axe, MD, have reviewed all documentation for this visit. The documentation on 04/23/21 for the exam, diagnosis, procedures, and orders are all accurate and complete.

## 2021-04-23 NOTE — Progress Notes (Deleted)
Patient Care Team: Prince Solian, MD as PCP - General (Internal Medicine) Troy Sine, MD as PCP - Cardiology (Cardiology) Deboraha Sprang, MD as PCP - Electrophysiology (Cardiology) Neldon Mc Donnamarie Poag, MD as Consulting Physician (Allergy and Immunology)   HPI  Jorge Cherry is a 85 y.o. male seen in follow-up for right bundle branch block pre-TAVR and left bundle branch block post TAVR and syncope for which he underwent pacemaker implantation 7/21 (Medtronic) History of ischemic heart disease with prior bypass surgery 1994 most recent catheterization 7/18 with a patent LIMA, patent vein graft to OM 3 and D2 and occlusion to his RCA.  EF was normal.  Atrial fibrillation with repeated cardioversion, 8/11 and 02/05/2020 holding sinus<   24 hours this despite amiodarone;   Chose to discontinue amio  anticoagulation with Apixoban  without bleeding     LHC 6/21 patent grafts, occluded natives, EF by Echo 55-60-% DATE TEST EF   6/21   LIMA>>LAD; SVG>>OM3;SVG>>D2 patent  SVG>>RCA T  6/21 Echo   55-60 %   8/21 Echo  60%           Date Cr K Hgb  7/21/  1.39 3.9 11.4   6/22 1.53 4.2 13.0      Thromboembolic risk factors ( age  -2, HTN-1, TIA/CVA-2, Vasc disease -1) for a CHADSVASc Score of >6     Records and Results Reviewed ma'am  Past Medical History:  Diagnosis Date   Arthritis    "just a touch in my hands" (11/24/2016)   Asthma    Atherosclerosis of renal artery ([place])    RENAL DOPPLER, 12/10/2011 - Left renal artery demonstrated narrowing with elevated velocities consistent with a 1-59% diameter reduction   CKD (chronic kidney disease) stage 3, GFR 30-59 ml/min (HCC)    "stable now since they backed off the water pills" (11/24/2016)   Coronary artery disease    a. 1994 s/p CABG x 4 (LIMA-LAD, VG->D2, VG->OM, VG->RCA); b. 02/2004 PCI SVG-D2 (3.5x16 Taxus DES). VG->RCA 100. Sev apical LAD dzs distal to LIMA insertion; c. 11/2016 Cath: LAD 95p/152m, D2 100ost, LCX 100ost,  70p/m, RCA 100p/m, RPDA fills via L->R collats. VG->RCA 100, VG->D2 20 ost, patent prox stent, 60m, LIMA->LAD ok, VG->OM3 20p. EF 55%-->Med Rx.   GERD (gastroesophageal reflux disease)    High cholesterol    History of lower GI bleeding    a. 09/2014 GIB due to diverticulosis/diverticulitis.   Iron deficiency anemia    Labile Hypertension    Moderate aortic stenosis    a. 10/2011 Echo:  EF >55%, mild-mod TR, mild-mod AS, mod Ca2+ of AoV leaflets; b. 07/2016 Echo: EF 60-65%, no rwma, Gr1 DD, mod AS [(S) mean grad 33mmHg, peak grad 27mmHg. Valve area (VTI): 1.33cm^2, (Vmax) 1.44cm^2. Mild MR]; c. 02/2018 Echo: EF 55-60%, no rwma, GR1 DD, Mod AS [Peak Vel (S): 319cm/s, Mean grad (S) 76mmHg, peak grad (S) 65mmHg].   PAF (paroxysmal atrial fibrillation) (HCC)    a. CHA2DS2VASc = 4-->Eliquis.   S/P TAVR (transcatheter aortic valve replacement) 11/14/2019   s/p TAVR with a 26 mm Edwards S3U via the TF approach by Drs Angelena Form & Bartle   Sleep apnea     Past Surgical History:  Procedure Laterality Date   CARDIAC CATHETERIZATION  02/27/2004   Coronary intervention and medical management   CARDIAC CATHETERIZATION  11/24/2016   CARDIOVERSION N/A 12/20/2019   Procedure: CARDIOVERSION;  Surgeon: Skeet Latch, MD;  Location: [place];  Service:  Cardiovascular;  Laterality: N/A;   CARDIOVERSION N/A 02/05/2020   Procedure: CARDIOVERSION;  Surgeon: Wendall Stade, MD;  Location: [facility] ENDOSCOPY;  Service: Cardiovascular;  Laterality: N/A;   CATARACT EXTRACTION W/ INTRAOCULAR LENS IMPLANT Left    CORONARY ANGIOPLASTY  1994 X 2   "before bypass surgery"   CORONARY ANGIOPLASTY WITH STENT PLACEMENT  03/04/2004   SVG supplying the diagonal vessel stented with a 3.5x69mm Taxus stent post dilated to 4.0 mm   CORONARY ARTERY BYPASS GRAFT  1994   "CABG X4"   INGUINAL HERNIA REPAIR Right    PACEMAKER IMPLANT N/A 11/20/2019   Procedure: PACEMAKER IMPLANT;  Surgeon: Duke Salvia, MD;  Location: [facility] INVASIVE  CV LAB;  Service: Cardiovascular;  Laterality: N/A;   RIGHT HEART CATH AND CORONARY/GRAFT ANGIOGRAPHY N/A 11/24/2016   Procedure: Right Heart Cath and Coronary/Graft Angiography;  Surgeon: Lennette Bihari, MD;  Location: [facility] INVASIVE CV LAB;  Service: Cardiovascular;  Laterality: N/A;   RIGHT/LEFT HEART CATH AND CORONARY/GRAFT ANGIOGRAPHY N/A 10/18/2019   Procedure: RIGHT/LEFT HEART CATH AND CORONARY/GRAFT ANGIOGRAPHY;  Surgeon: Lennette Bihari, MD;  Location: [facility] INVASIVE CV LAB;  Service: Cardiovascular;  Laterality: N/A;   TEE WITHOUT CARDIOVERSION N/A 11/14/2019   Procedure: TRANSESOPHAGEAL ECHOCARDIOGRAM (TEE);  Surgeon: Kathleene Hazel, MD;  Location: [facility] INVASIVE CV LAB;  Service: Open Heart Surgery;  Laterality: N/A;   TONSILLECTOMY AND ADENOIDECTOMY     TRANSCATHETER AORTIC VALVE REPLACEMENT, TRANSFEMORAL N/A 11/14/2019   Procedure: TRANSCATHETER AORTIC VALVE REPLACEMENT, TRANSFEMORAL;  Surgeon: Kathleene Hazel, MD;  Location: [facility] INVASIVE CV LAB;  Service: Open Heart Surgery;  Laterality: N/A;    No outpatient medications have been marked as taking for the 04/23/21 encounter (Appointment) with Duke Salvia, MD.    Allergies  Allergen Reactions   Altace [Ramipril] Swelling and Other (See Comments)    Mouth swelling   Mucinex [Guaifenesin Er] Hives, Swelling and Other (See Comments)    Mouth swelling   Azithromycin     Other reaction(s): Does not work   Contrast Media [Iodinated Diagnostic Agents] Other (See Comments)    Made eyes change each time      Review of Systems negative except from HPI and PMH  Physical Exam There were no vitals taken for this visit. Well developed and well nourished in no acute distress HENT normal E scleral and icterus clear Neck Supple JVP flat; carotids brisk and full Clear to ausculation Irregular rate and rhythm with a 2-3/6 systolic murmur not changing with RR intervals Soft with active bowel sounds No clubbing cyanosis   Edema Alert and oriented, grossly normal motor and sensory function Skin Warm and Dry  ECG atrial fibrillation with ventricular pacing at 73  CrCl cannot be calculated (Patient's most recent lab result is older than the maximum 21 days allowed.).   Assessment and  Plan  Syncope recurrent   RBBB pre tTAVR,  LBBB post TAVR   ICM, s/p CABG normal LV function   AF permanent   Pacemaker-Medtronic   HFpEF  Renal insufficiency  We will recheck a metabolic profile.  Creatinine most recently was 1.53 which at his age would prompt a down titration of his apixaban dose, for now continue the dose at 5 mg twice daily     No bleeding on his anticoagulation.  No recurrent syncope.   Current medicines are reviewed at length with the patient today .  The patient does not  have concerns regarding medicines.

## 2021-04-24 LAB — BASIC METABOLIC PANEL
BUN/Creatinine Ratio: 16 (ref 10–24)
BUN: 27 mg/dL (ref 8–27)
CO2: 20 mmol/L (ref 20–29)
Calcium: 9.2 mg/dL (ref 8.6–10.2)
Chloride: 108 mmol/L — ABNORMAL HIGH (ref 96–106)
Creatinine, Ser: 1.71 mg/dL — ABNORMAL HIGH (ref 0.76–1.27)
Glucose: 95 mg/dL (ref 70–99)
Potassium: 4.3 mmol/L (ref 3.5–5.2)
Sodium: 144 mmol/L (ref 134–144)
eGFR: 39 mL/min/{1.73_m2} — ABNORMAL LOW (ref >59)

## 2021-05-10 ENCOUNTER — Other Ambulatory Visit: Payer: Self-pay | Admitting: Cardiovascular Disease

## 2021-05-15 DIAGNOSIS — M62838 Other muscle spasm: Secondary | ICD-10-CM | POA: Diagnosis not present

## 2021-05-15 DIAGNOSIS — I480 Paroxysmal atrial fibrillation: Secondary | ICD-10-CM | POA: Diagnosis not present

## 2021-05-15 DIAGNOSIS — K869 Disease of pancreas, unspecified: Secondary | ICD-10-CM | POA: Diagnosis not present

## 2021-05-15 DIAGNOSIS — M542 Cervicalgia: Secondary | ICD-10-CM | POA: Diagnosis not present

## 2021-05-22 ENCOUNTER — Ambulatory Visit (INDEPENDENT_AMBULATORY_CARE_PROVIDER_SITE_OTHER): Payer: Medicare PPO

## 2021-05-22 DIAGNOSIS — I442 Atrioventricular block, complete: Secondary | ICD-10-CM | POA: Diagnosis not present

## 2021-05-23 LAB — CUP PACEART REMOTE DEVICE CHECK
Battery Remaining Longevity: 148 mo
Battery Voltage: 3.02 V
Brady Statistic RA Percent Paced: 0 %
Brady Statistic RV Percent Paced: 50.41 %
Date Time Interrogation Session: 20230112163742
Implantable Lead Implant Date: 20210712
Implantable Lead Implant Date: 20210712
Implantable Lead Location: 753859
Implantable Lead Location: 753860
Implantable Lead Model: 5076
Implantable Lead Model: 5076
Implantable Pulse Generator Implant Date: 20210712
Lead Channel Impedance Value: 304 Ohm
Lead Channel Impedance Value: 380 Ohm
Lead Channel Impedance Value: 437 Ohm
Lead Channel Impedance Value: 437 Ohm
Lead Channel Pacing Threshold Amplitude: 0.625 V
Lead Channel Pacing Threshold Amplitude: 1 V
Lead Channel Pacing Threshold Pulse Width: 0.4 ms
Lead Channel Pacing Threshold Pulse Width: 0.4 ms
Lead Channel Sensing Intrinsic Amplitude: 1.125 mV
Lead Channel Sensing Intrinsic Amplitude: 1.125 mV
Lead Channel Sensing Intrinsic Amplitude: 14.25 mV
Lead Channel Sensing Intrinsic Amplitude: 14.25 mV
Lead Channel Setting Pacing Amplitude: 2.5 V
Lead Channel Setting Pacing Pulse Width: 0.4 ms
Lead Channel Setting Sensing Sensitivity: 0.9 mV

## 2021-05-27 ENCOUNTER — Other Ambulatory Visit: Payer: Self-pay

## 2021-05-27 ENCOUNTER — Ambulatory Visit (HOSPITAL_COMMUNITY)
Admission: RE | Admit: 2021-05-27 | Discharge: 2021-05-27 | Disposition: A | Discharge status: Home / Self Care | Payer: Medicare PPO | Source: Ambulatory Visit | Attending: Internal Medicine | Admitting: Internal Medicine

## 2021-05-27 ENCOUNTER — Telehealth: Payer: Self-pay | Admitting: Cardiovascular Disease

## 2021-05-27 ENCOUNTER — Other Ambulatory Visit: Payer: Self-pay | Admitting: Internal Medicine

## 2021-05-27 DIAGNOSIS — R200 Anesthesia of skin: Secondary | ICD-10-CM | POA: Diagnosis not present

## 2021-05-27 DIAGNOSIS — R202 Paresthesia of skin: Secondary | ICD-10-CM | POA: Insufficient documentation

## 2021-05-27 NOTE — Telephone Encounter (Signed)
New Message:      Patient says his right arm is cold and a little numbness and tingling.[name] He is very concerned about this and wants to be seen.

## 2021-05-27 NOTE — Addendum Note (Signed)
Addended by: Meryl Crutch on: 05/27/2021 12:46 PM   Modules accepted: Orders

## 2021-05-27 NOTE — Telephone Encounter (Signed)
Spoke with pt and wife. He report decrease pulse, coldness, and numbness/tingling in right arm x 1 week.   DOD Dr. Harl Bowie made aware and recommended an upper extremity duplex.  Pt agreeable to plan. Pt placed on the scheduled for today 1/17 at 3 pm

## 2021-06-03 NOTE — Progress Notes (Signed)
Remote pacemaker transmission.   

## 2021-06-18 ENCOUNTER — Telehealth: Payer: Self-pay

## 2021-06-18 NOTE — Telephone Encounter (Signed)
Called patient, received notification he would like to take a prostate support supplement- Dr.Kelly stated it would be okay to take.   Patient verbalized understanding.   Patient and wife also state that he is having issues with his CPAP machine- he has not used it in over a week. He was told to contact [facility], and they did but they have not heard back yet.  Patient and wife advised to try to call back to reach someone. She states they were suppose to hear back from [facility].   Advised I would send a message to our sleep coordinator to see if she can have them contact the patients to discuss their issues further.   Patient and wife thankful for call back.

## 2021-06-28 ENCOUNTER — Emergency Department: Payer: Medicare PPO

## 2021-06-28 ENCOUNTER — Other Ambulatory Visit: Payer: Self-pay

## 2021-06-28 ENCOUNTER — Inpatient Hospital Stay
Admission: EM | Admit: 2021-06-28 | Discharge: 2021-07-01 | DRG: 065 | Disposition: A | Discharge status: Home Health | Payer: Medicare PPO | Attending: Internal Medicine | Admitting: Internal Medicine

## 2021-06-28 DIAGNOSIS — I5032 Chronic diastolic (congestive) heart failure: Secondary | ICD-10-CM | POA: Diagnosis present

## 2021-06-28 DIAGNOSIS — R29701 NIHSS score 1: Secondary | ICD-10-CM | POA: Diagnosis present

## 2021-06-28 DIAGNOSIS — T45516A Underdosing of anticoagulants, initial encounter: Secondary | ICD-10-CM | POA: Diagnosis present

## 2021-06-28 DIAGNOSIS — R42 Dizziness and giddiness: Secondary | ICD-10-CM | POA: Diagnosis not present

## 2021-06-28 DIAGNOSIS — Z952 Presence of prosthetic heart valve: Secondary | ICD-10-CM | POA: Diagnosis not present

## 2021-06-28 DIAGNOSIS — H532 Diplopia: Secondary | ICD-10-CM | POA: Diagnosis present

## 2021-06-28 DIAGNOSIS — Z20822 Contact with and (suspected) exposure to covid-19: Secondary | ICD-10-CM | POA: Diagnosis present

## 2021-06-28 DIAGNOSIS — Z79890 Hormone replacement therapy (postmenopausal): Secondary | ICD-10-CM

## 2021-06-28 DIAGNOSIS — I447 Left bundle-branch block, unspecified: Secondary | ICD-10-CM | POA: Diagnosis present

## 2021-06-28 DIAGNOSIS — R4781 Slurred speech: Secondary | ICD-10-CM | POA: Diagnosis present

## 2021-06-28 DIAGNOSIS — I739 Peripheral vascular disease, unspecified: Secondary | ICD-10-CM | POA: Diagnosis not present

## 2021-06-28 DIAGNOSIS — Z823 Family history of stroke: Secondary | ICD-10-CM

## 2021-06-28 DIAGNOSIS — Z8249 Family history of ischemic heart disease and other diseases of the circulatory system: Secondary | ICD-10-CM

## 2021-06-28 DIAGNOSIS — I480 Paroxysmal atrial fibrillation: Secondary | ICD-10-CM | POA: Diagnosis present

## 2021-06-28 DIAGNOSIS — G839 Paralytic syndrome, unspecified: Secondary | ICD-10-CM | POA: Diagnosis not present

## 2021-06-28 DIAGNOSIS — Z91138 Patient's unintentional underdosing of medication regimen for other reason: Secondary | ICD-10-CM

## 2021-06-28 DIAGNOSIS — I10 Essential (primary) hypertension: Secondary | ICD-10-CM | POA: Diagnosis present

## 2021-06-28 DIAGNOSIS — E032 Hypothyroidism due to medicaments and other exogenous substances: Secondary | ICD-10-CM | POA: Diagnosis present

## 2021-06-28 DIAGNOSIS — N183 Chronic kidney disease, stage 3 unspecified: Secondary | ICD-10-CM | POA: Diagnosis present

## 2021-06-28 DIAGNOSIS — R079 Chest pain, unspecified: Secondary | ICD-10-CM | POA: Diagnosis present

## 2021-06-28 DIAGNOSIS — I482 Chronic atrial fibrillation, unspecified: Secondary | ICD-10-CM

## 2021-06-28 DIAGNOSIS — K862 Cyst of pancreas: Secondary | ICD-10-CM | POA: Diagnosis present

## 2021-06-28 DIAGNOSIS — I726 Aneurysm of vertebral artery: Secondary | ICD-10-CM | POA: Diagnosis not present

## 2021-06-28 DIAGNOSIS — Z8673 Personal history of transient ischemic attack (TIA), and cerebral infarction without residual deficits: Secondary | ICD-10-CM | POA: Diagnosis not present

## 2021-06-28 DIAGNOSIS — H510 Palsy (spasm) of conjugate gaze: Secondary | ICD-10-CM | POA: Diagnosis present

## 2021-06-28 DIAGNOSIS — R531 Weakness: Secondary | ICD-10-CM | POA: Diagnosis not present

## 2021-06-28 DIAGNOSIS — K219 Gastro-esophageal reflux disease without esophagitis: Secondary | ICD-10-CM | POA: Diagnosis present

## 2021-06-28 DIAGNOSIS — I6523 Occlusion and stenosis of bilateral carotid arteries: Secondary | ICD-10-CM | POA: Diagnosis present

## 2021-06-28 DIAGNOSIS — Z7901 Long term (current) use of anticoagulants: Secondary | ICD-10-CM | POA: Diagnosis not present

## 2021-06-28 DIAGNOSIS — Z951 Presence of aortocoronary bypass graft: Secondary | ICD-10-CM

## 2021-06-28 DIAGNOSIS — R2681 Unsteadiness on feet: Secondary | ICD-10-CM | POA: Diagnosis present

## 2021-06-28 DIAGNOSIS — I130 Hypertensive heart and chronic kidney disease with heart failure and stage 1 through stage 4 chronic kidney disease, or unspecified chronic kidney disease: Secondary | ICD-10-CM | POA: Diagnosis present

## 2021-06-28 DIAGNOSIS — I672 Cerebral atherosclerosis: Secondary | ICD-10-CM | POA: Diagnosis not present

## 2021-06-28 DIAGNOSIS — Z950 Presence of cardiac pacemaker: Secondary | ICD-10-CM | POA: Diagnosis not present

## 2021-06-28 DIAGNOSIS — Z91041 Radiographic dye allergy status: Secondary | ICD-10-CM

## 2021-06-28 DIAGNOSIS — Z955 Presence of coronary angioplasty implant and graft: Secondary | ICD-10-CM | POA: Diagnosis not present

## 2021-06-28 DIAGNOSIS — I639 Cerebral infarction, unspecified: Secondary | ICD-10-CM | POA: Diagnosis not present

## 2021-06-28 DIAGNOSIS — I671 Cerebral aneurysm, nonruptured: Secondary | ICD-10-CM | POA: Diagnosis present

## 2021-06-28 DIAGNOSIS — M47812 Spondylosis without myelopathy or radiculopathy, cervical region: Secondary | ICD-10-CM | POA: Diagnosis not present

## 2021-06-28 DIAGNOSIS — H538 Other visual disturbances: Secondary | ICD-10-CM | POA: Diagnosis not present

## 2021-06-28 DIAGNOSIS — I2510 Atherosclerotic heart disease of native coronary artery without angina pectoris: Secondary | ICD-10-CM | POA: Diagnosis not present

## 2021-06-28 DIAGNOSIS — I4891 Unspecified atrial fibrillation: Secondary | ICD-10-CM | POA: Diagnosis not present

## 2021-06-28 DIAGNOSIS — I442 Atrioventricular block, complete: Secondary | ICD-10-CM | POA: Diagnosis present

## 2021-06-28 DIAGNOSIS — Q283 Other malformations of cerebral vessels: Secondary | ICD-10-CM | POA: Diagnosis not present

## 2021-06-28 DIAGNOSIS — R269 Unspecified abnormalities of gait and mobility: Secondary | ICD-10-CM

## 2021-06-28 DIAGNOSIS — I6340 Cerebral infarction due to embolism of unspecified cerebral artery: Principal | ICD-10-CM | POA: Diagnosis present

## 2021-06-28 DIAGNOSIS — N400 Enlarged prostate without lower urinary tract symptoms: Secondary | ICD-10-CM | POA: Diagnosis present

## 2021-06-28 DIAGNOSIS — Z888 Allergy status to other drugs, medicaments and biological substances status: Secondary | ICD-10-CM

## 2021-06-28 DIAGNOSIS — R29818 Other symptoms and signs involving the nervous system: Secondary | ICD-10-CM | POA: Diagnosis not present

## 2021-06-28 DIAGNOSIS — Z974 Presence of external hearing-aid: Secondary | ICD-10-CM

## 2021-06-28 DIAGNOSIS — D649 Anemia, unspecified: Secondary | ICD-10-CM | POA: Diagnosis present

## 2021-06-28 DIAGNOSIS — I6501 Occlusion and stenosis of right vertebral artery: Secondary | ICD-10-CM | POA: Diagnosis not present

## 2021-06-28 DIAGNOSIS — Z79899 Other long term (current) drug therapy: Secondary | ICD-10-CM

## 2021-06-28 DIAGNOSIS — E7800 Pure hypercholesterolemia, unspecified: Secondary | ICD-10-CM | POA: Diagnosis present

## 2021-06-28 DIAGNOSIS — I6389 Other cerebral infarction: Secondary | ICD-10-CM | POA: Diagnosis not present

## 2021-06-28 LAB — COMPREHENSIVE METABOLIC PANEL
ALT: 22 U/L (ref 0–44)
AST: 25 U/L (ref 15–41)
Albumin: 3.5 g/dL (ref 3.5–5.0)
Alkaline Phosphatase: 43 U/L (ref 38–126)
Anion gap: 7 (ref 5–15)
BUN: 26 mg/dL — ABNORMAL HIGH (ref 8–23)
CO2: 26 mmol/L (ref 22–32)
Calcium: 9.1 mg/dL (ref 8.9–10.3)
Chloride: 108 mmol/L (ref 98–111)
Creatinine, Ser: 1.43 mg/dL — ABNORMAL HIGH (ref 0.61–1.24)
GFR, Estimated: 49 mL/min — ABNORMAL LOW (ref >60)
Glucose, Bld: 116 mg/dL — ABNORMAL HIGH (ref 70–99)
Potassium: 3.3 mmol/L — ABNORMAL LOW (ref 3.5–5.1)
Sodium: 141 mmol/L (ref 135–145)
Total Bilirubin: 0.6 mg/dL (ref 0.3–1.2)
Total Protein: 6.5 g/dL (ref 6.5–8.1)

## 2021-06-28 LAB — CBC WITH DIFFERENTIAL/PLATELET
Abs Immature Granulocytes: 0.06 10*3/uL (ref 0.00–0.07)
Basophils Absolute: 0 10*3/uL (ref 0.0–0.1)
Basophils Relative: 1 %
Eosinophils Absolute: 0.2 10*3/uL (ref 0.0–0.5)
Eosinophils Relative: 2 %
HCT: 38.8 % — ABNORMAL LOW (ref 39.0–52.0)
Hemoglobin: 12.5 g/dL — ABNORMAL LOW (ref 13.0–17.0)
Immature Granulocytes: 1 %
Lymphocytes Relative: 10 %
Lymphs Abs: 0.9 10*3/uL (ref 0.7–4.0)
MCH: 29.7 pg (ref 26.0–34.0)
MCHC: 32.2 g/dL (ref 30.0–36.0)
MCV: 92.2 fL (ref 80.0–100.0)
Monocytes Absolute: 0.9 10*3/uL (ref 0.1–1.0)
Monocytes Relative: 11 %
Neutro Abs: 6.3 10*3/uL (ref 1.7–7.7)
Neutrophils Relative %: 75 %
Platelets: 160 10*3/uL (ref 150–400)
RBC: 4.21 MIL/uL — ABNORMAL LOW (ref 4.22–5.81)
RDW: 14.1 % (ref 11.5–15.5)
WBC: 8.3 10*3/uL (ref 4.0–10.5)
nRBC: 0 % (ref 0.0–0.2)

## 2021-06-28 LAB — APTT: aPTT: 33 seconds (ref 24–36)

## 2021-06-28 LAB — URINALYSIS, ROUTINE W REFLEX MICROSCOPIC
Bilirubin Urine: NEGATIVE
Glucose, UA: NEGATIVE mg/dL
Hgb urine dipstick: NEGATIVE
Ketones, ur: NEGATIVE mg/dL
Leukocytes,Ua: NEGATIVE
Nitrite: NEGATIVE
Protein, ur: NEGATIVE mg/dL
Specific Gravity, Urine: 1.006 (ref 1.005–1.030)
pH: 7 (ref 5.0–8.0)

## 2021-06-28 LAB — RESP PANEL BY RT-PCR (FLU A&B, COVID) ARPGX2
Influenza A by PCR: NEGATIVE
Influenza B by PCR: NEGATIVE
SARS Coronavirus 2 by RT PCR: NEGATIVE

## 2021-06-28 LAB — PROTIME-INR
INR: 1.1 (ref 0.8–1.2)
Prothrombin Time: 14.2 seconds (ref 11.4–15.2)

## 2021-06-28 IMAGING — CT CT HEAD CODE STROKE
4 series · 17 of 47 positions shown, 19 images · non-contrast
Comparison: CT head [DATE].

CLINICAL DATA: Code stroke.  Neuro deficit, acute, stroke suspected



[Series 3: head wo · axial · 0.46mm/px · z∈[+496,+621]mm · 6 of 35 slices shown, 8 images]
[im 5/35  brain]
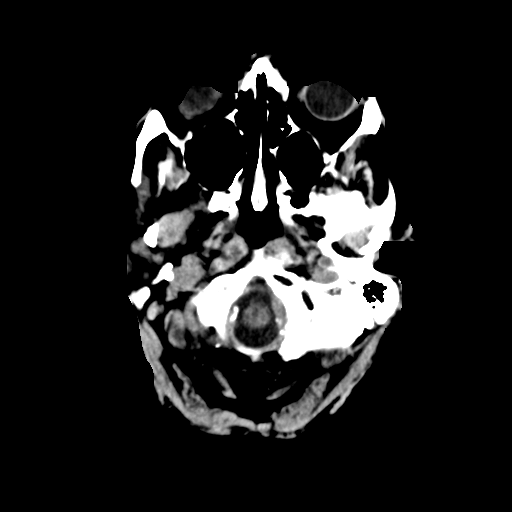
[im 5/35  bone]
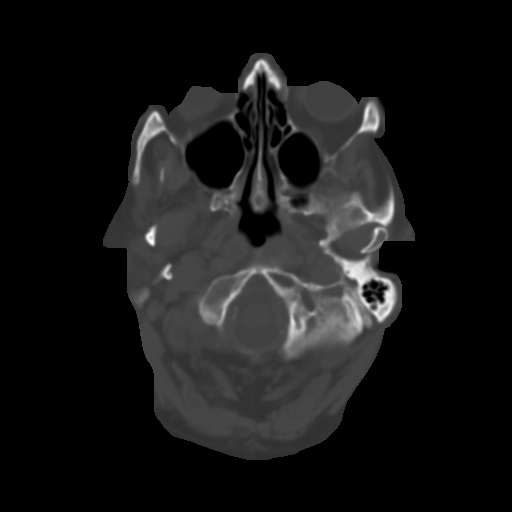
[im 10/35  brain]
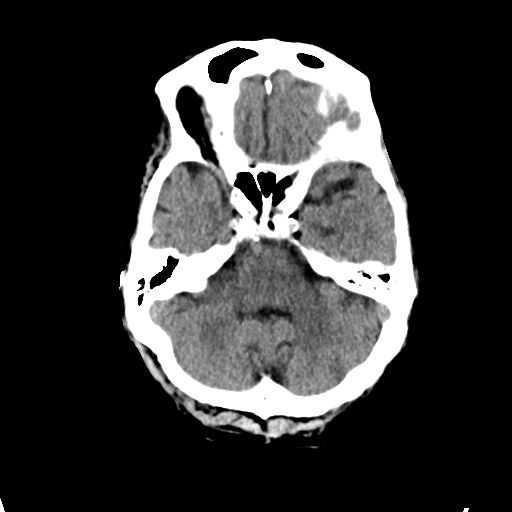
[im 15/35  brain]
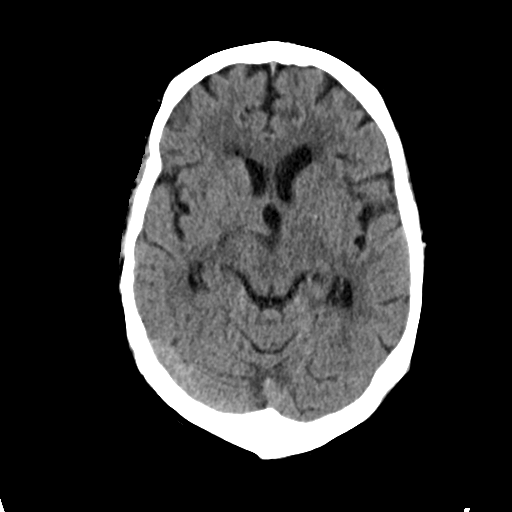
[im 20/35  brain]
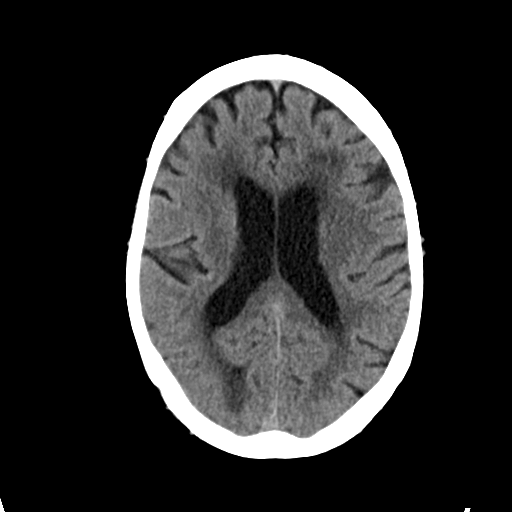
[im 25/35  brain]
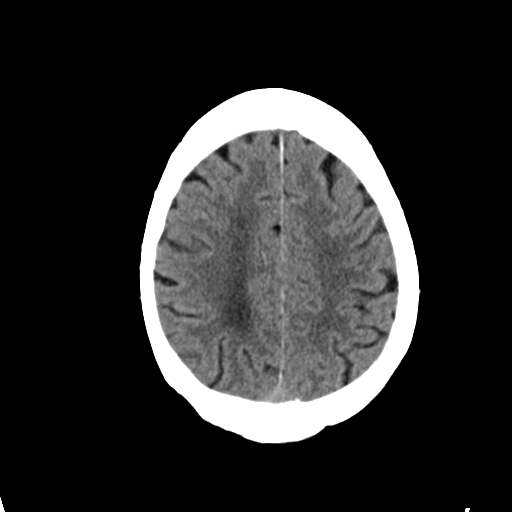
[im 25/35  bone]
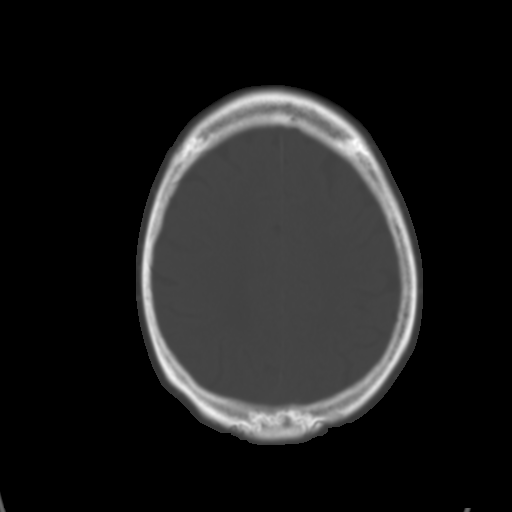
[im 30/35  brain]
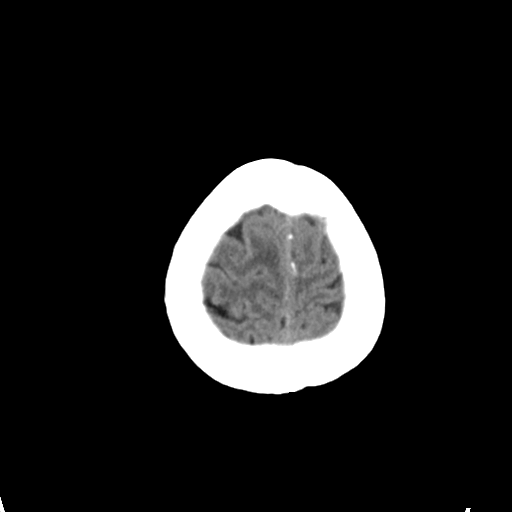

[Series 4: head bone · axial · 0.46mm/px · z∈[+492,+576]mm · 5 of 89 slices shown]
[im 9/89  bone]
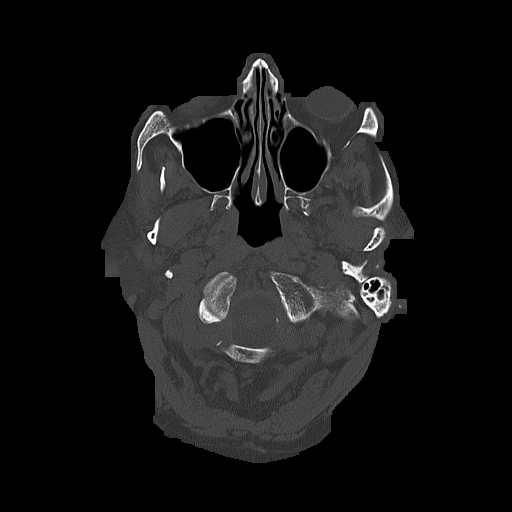
[im 17/89  bone]
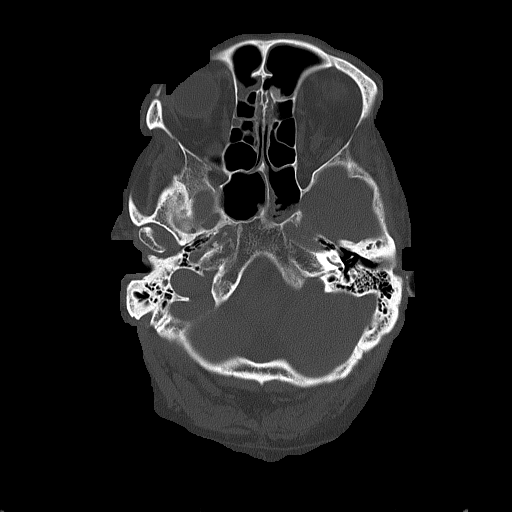
[im 30/89  bone]
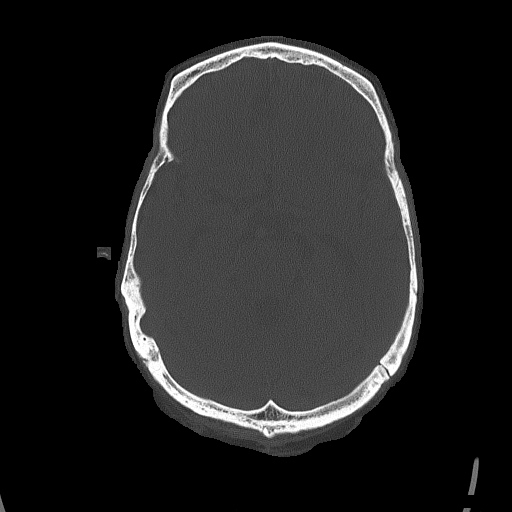
[im 38/89  bone]
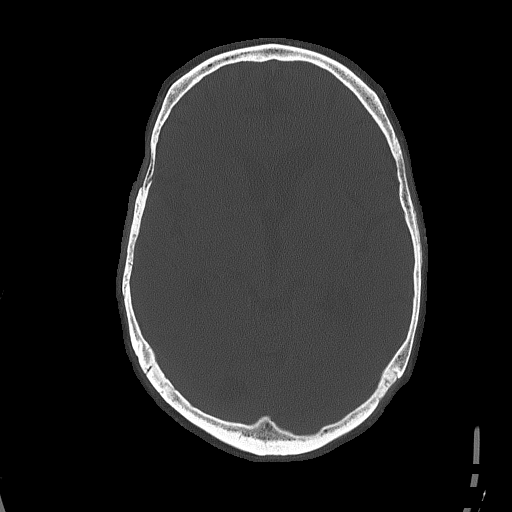
[im 51/89  bone]
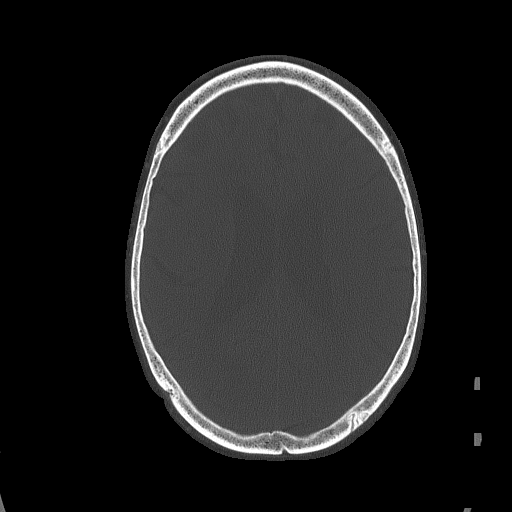

[Series 5: coronal soft tissue · coronal · 0.37mm/px · 3 of 72 slices shown]
[im 24/72  brain]
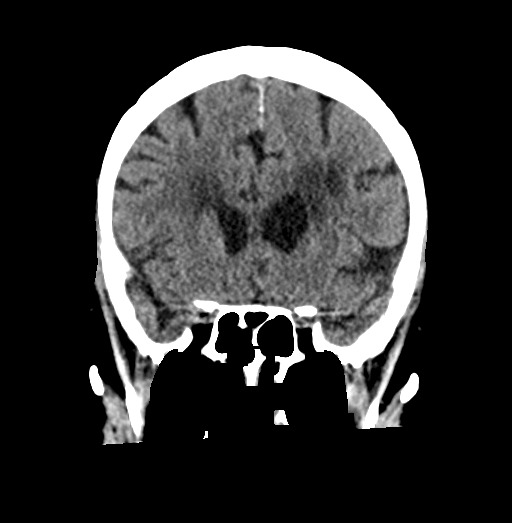
[im 32/72  brain]
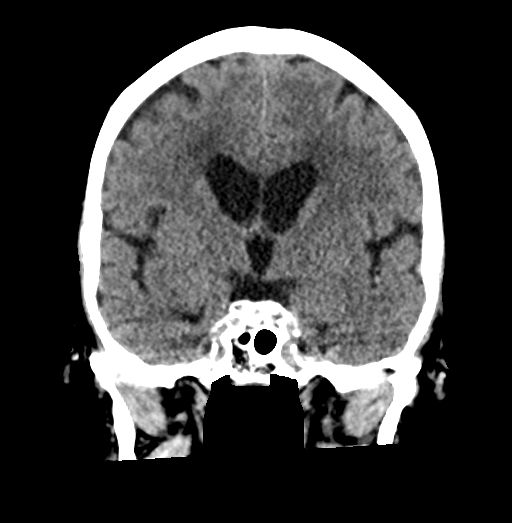
[im 40/72  brain]
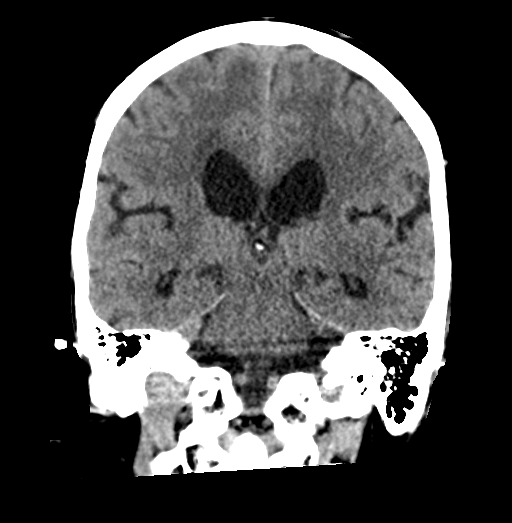

[Series 6: sagittal soft tissue · sagittal · 0.38mm/px · 3 of 62 slices shown]
[im 21/62  brain]
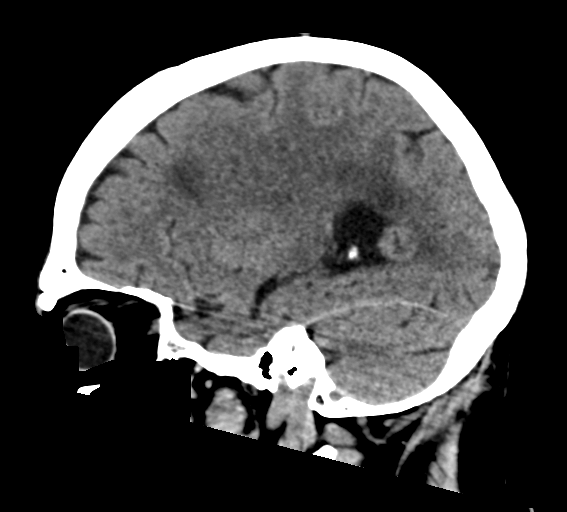
[im 31/62  brain]
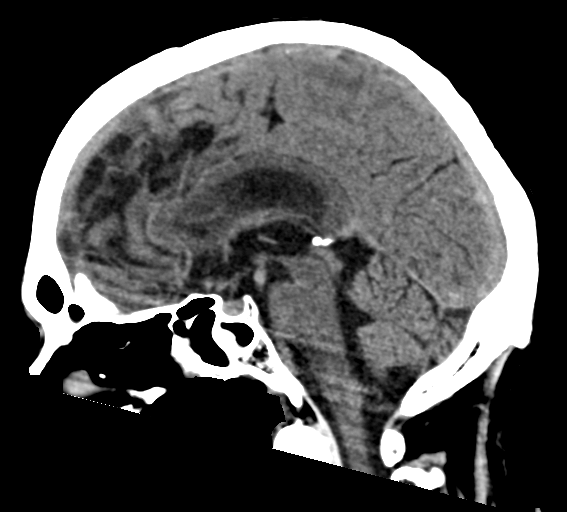
[im 41/62  brain]
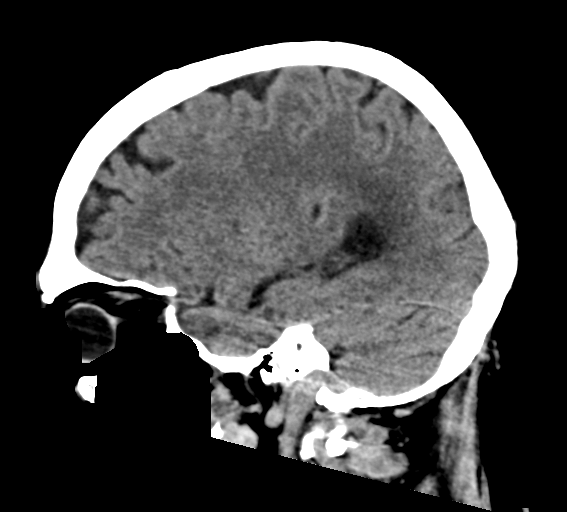

[17 of 47 positions shown; findings below may reference images not displayed]

FINDINGS: Brain: Similar appearance of remote infarct in the right occipital
lobe. No evidence of acute large vascular territory infarct.
Moderate to advanced patchy white matter hypoattenuation,
nonspecific but compatible with chronic microvascular ischemic
disease. Cerebral atrophy with ex vacuo ventricular dilation. No
hydrocephalus. No acute hemorrhage, mass lesion, or midline shift.

Vascular: No hyperdense vessel identified.

Skull: No evidence of acute fracture.

Sinuses/Orbits: Mild paranasal sinus mucosal thickening.
Unremarkable orbits.

Other: No mastoid effusions.

ASPECTS ([place] Stroke Program Early CT Score) Total score (0-10
with 10 being normal): 10.
IMPRESSION: 1. No evidence of acute intracranial abnormality. ASPECTS is 10.
2. Remote right occipital infarct and moderate to advanced chronic
microvascular ischemic disease.
3. Cerebral Atrophy ([0A]-[0A]).

Code stroke imaging results were communicated on [DATE] at [DATE] to provider SCHALL[facility] via secure text paging.

## 2021-06-28 IMAGING — CT CT ANGIO HEAD-NECK (W OR W/O PERF)
2 of 7 series · 8 of 33 positions shown · IV contrast (APPLIED)
Comparison: Head CT from earlier the same day.

CLINICAL DATA: Initial evaluation for neuro deficit, stroke
suspected.

EXAM:
CT ANGIOGRAPHY HEAD AND NECK
TECHNIQUE: Multidetector CT imaging of the head and neck was performed using
the standard protocol during bolus administration of intravenous
contrast. Multiplanar CT image reconstructions and MIPs were
obtained to evaluate the vascular anatomy. Carotid stenosis
measurements (when applicable) are obtained utilizing NASCET
criteria, using the distal internal carotid diameter as the
denominator.

[Series 7: cta head neck · axial · 0.63mm/px · z∈[+349,+473]mm · 2 of 187 slices shown]
[im 63/187  soft-tissue]
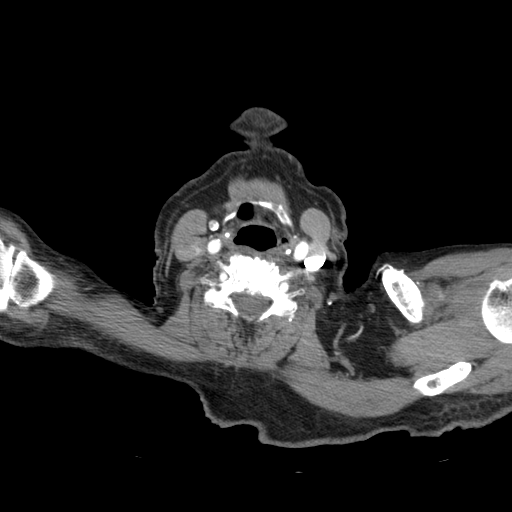
[im 125/187  soft-tissue]
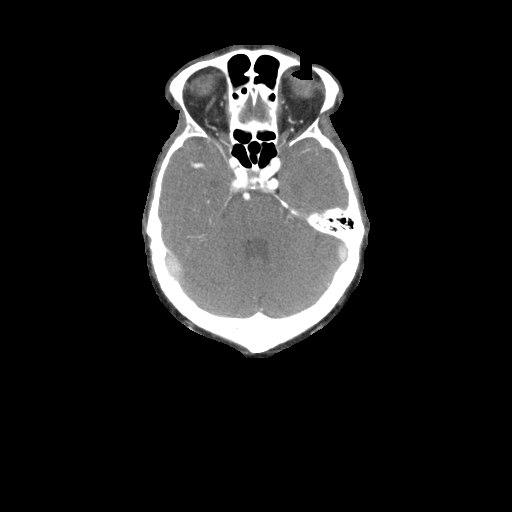

[Series 9: ax thin · axial · 0.53mm/px · z∈[+276,+537]mm · 6 of 363 slices shown]
[im 52/363  soft-tissue]
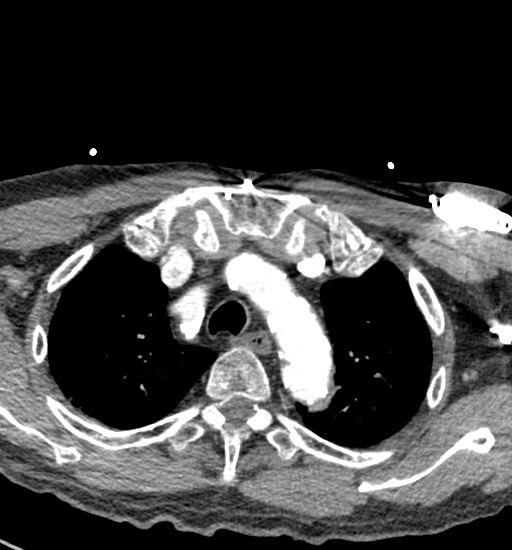
[im 104/363  bone]
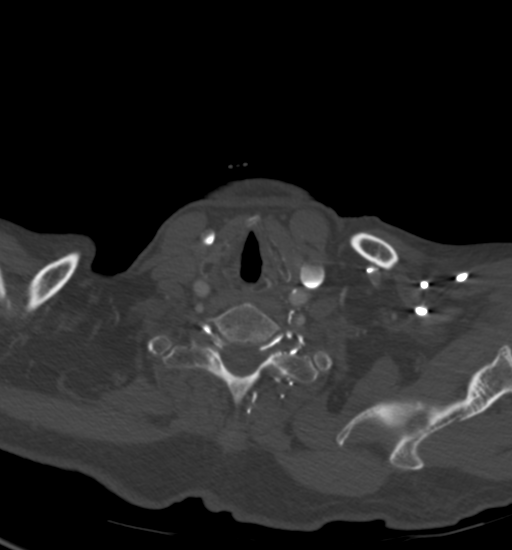
[im 156/363  soft-tissue]
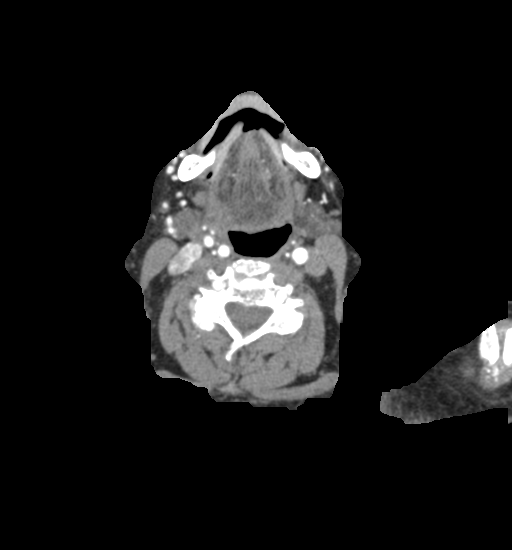
[im 207/363  bone]
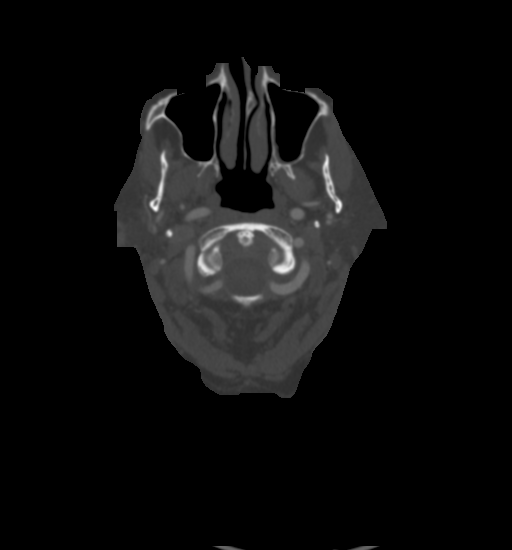
[im 259/363  soft-tissue]
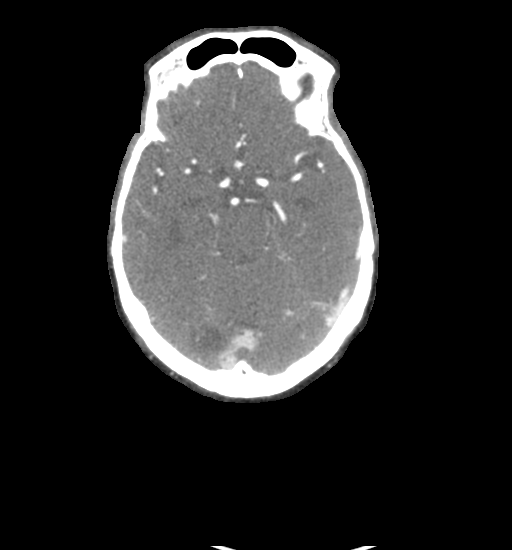
[im 311/363  bone]
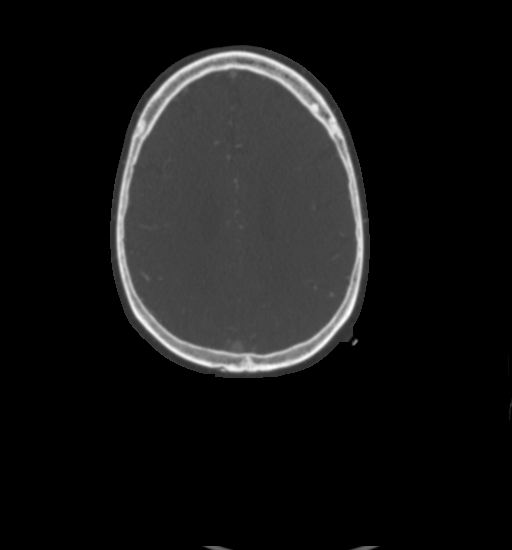

[8 of 33 positions shown; findings below may reference images not displayed]

RADIATION DOSE REDUCTION: This exam was performed according to the
departmental dose-optimization program which includes automated
exposure control, adjustment of the mA and/or kV according to
patient size and/or use of iterative reconstruction technique.

CONTRAST:  75mL OMNIPAQUE IOHEXOL 350 MG/ML SOLN
FINDINGS: CTA NECK FINDINGS

Aortic arch: Visualized aortic arch normal in caliber with normal
branch pattern. Moderate atheromatous change seen about the arch
itself. No high-grade stenosis about the origin the great vessels.

Right carotid system: Right CCA patent without stenosis. Scattered
calcified plaque about the right carotid bulb/proximal right ICA
with associated stenosis of up to 40% by NASCET criteria. Right ICA
mildly tortuous but widely patent distally without stenosis or
dissection.

Left carotid system: Left CCA patent from its origin to the
bifurcation without stenosis. Eccentric bulky calcified plaque about
the left carotid bulb/proximal left ICA with associated stenosis of
up to 50-60% stenosis by NASCET criteria. Left ICA tortuous but
widely patent distally without stenosis or dissection.

Vertebral arteries: Both vertebral arteries arise from the
subclavian arteries. Vertebral arteries mildly tortuous but are
widely patent without stenosis or dissection.

Skeleton: No discrete or worrisome osseous lesions. Advanced
spondylosis present at C5-6. Patient is edentulous.

Other neck: No other acute soft tissue abnormality within the neck.

Upper chest: Visualized upper chest demonstrates no acute finding.
Left-sided pacemaker/AICD noted.

Review of the MIP images confirms the above findings

CTA HEAD FINDINGS

Anterior circulation: Petrous segments patent bilaterally.
Atheromatous change within the carotid siphons with no more than
mild multifocal narrowing. A1 segments patent bilaterally. Right A1
hypoplastic. Normal anterior communicating artery complex. Anterior
cerebral arteries patent without stenosis. No M1 stenosis or
occlusion. Normal MCA bifurcations. Distal MCA branches perfused and
symmetric.

Posterior circulation: Atheromatous change within the proximal V4
segments bilaterally with associated mild stenosis on the right.
Both PICA patent at their origins. There is a fusiform aneurysm
measuring up to 3 mm in maximal transverse diameter involving the
distal left PICA (series 9, image 250). Additional smaller focal
aneurysm noted slightly distally as well (series 10, image 148).
Basilar patent to its distal aspect without stenosis. Superior
cerebellar arteries patent bilaterally. Both PCAs primarily supplied
via the basilar well perfused or distal aspects.

Venous sinuses: Patent allowing for timing the contrast bolus.

Anatomic variants: None significant.

Review of the MIP images confirms the above findings
IMPRESSION: 1. Negative CTA for large vessel occlusion.
2. Eccentric bulky calcified plaque about the left carotid
bulb/proximal left ICA with associated stenosis of up to 50-60% by
NASCET criteria.
3. Atheromatous change about the right carotid bulb/proximal right
ICA with associated stenosis of up to 40% by NASCET criteria.
4. Irregular fusiform aneurysm arising from the distal left PICA as
above, measuring 3 mm in maximal diameter.
5. Diffuse tortuosity of the major arterial vasculature of the head
and neck, suggesting chronic underlying hypertension.

## 2021-06-28 MED ORDER — STROKE: EARLY STAGES OF RECOVERY BOOK: Freq: Once | Status: AC

## 2021-06-28 MED ORDER — IOHEXOL 350 MG/ML SOLN
75.0000 mL | Freq: Once | INTRAVENOUS | Status: AC | PRN
  Administered 2021-06-28: 75 mL via INTRAVENOUS

## 2021-06-28 MED ORDER — ACETAMINOPHEN 160 MG/5ML PO SOLN
650.0000 mg | ORAL | Status: DC | PRN
  Filled 2021-06-28: qty 20.3

## 2021-06-28 MED ORDER — METOPROLOL SUCCINATE ER 50 MG PO TB24: 25.0000 mg | ORAL_TABLET | Freq: Every day | ORAL | Status: DC

## 2021-06-28 MED ORDER — SODIUM CHLORIDE 0.9 % IV SOLN: INTRAVENOUS | Status: AC

## 2021-06-28 MED ORDER — HYDROCORTISONE SOD SUC (PF) 250 MG IJ SOLR
200.0000 mg | Freq: Once | INTRAMUSCULAR | Status: AC
  Administered 2021-06-28: 200 mg via INTRAVENOUS
  Filled 2021-06-28: qty 200

## 2021-06-28 MED ORDER — ACETAMINOPHEN 325 MG PO TABS: 650.0000 mg | ORAL_TABLET | ORAL | Status: DC | PRN

## 2021-06-28 MED ORDER — HYDRALAZINE HCL 20 MG/ML IJ SOLN: 10.0000 mg | INTRAMUSCULAR | Status: DC | PRN

## 2021-06-28 MED ORDER — DIPHENHYDRAMINE HCL 50 MG/ML IJ SOLN
50.0000 mg | Freq: Once | INTRAMUSCULAR | Status: AC
  Administered 2021-06-28: 50 mg via INTRAVENOUS
  Filled 2021-06-28: qty 1

## 2021-06-28 MED ORDER — ATORVASTATIN CALCIUM 20 MG PO TABS
20.0000 mg | ORAL_TABLET | Freq: Every day | ORAL | Status: DC
  Administered 2021-06-28 – 2021-06-29 (×2): 20 mg via ORAL
  Filled 2021-06-28 (×2): qty 1

## 2021-06-28 MED ORDER — NITROGLYCERIN 0.4 MG SL SUBL: 0.4000 mg | SUBLINGUAL_TABLET | SUBLINGUAL | Status: DC | PRN

## 2021-06-28 MED ORDER — TAMSULOSIN HCL 0.4 MG PO CAPS
0.4000 mg | ORAL_CAPSULE | Freq: Every day | ORAL | Status: DC
  Administered 2021-06-28 – 2021-06-30 (×3): 0.4 mg via ORAL
  Filled 2021-06-28 (×3): qty 1

## 2021-06-28 MED ORDER — ALBUTEROL SULFATE (2.5 MG/3ML) 0.083% IN NEBU: 2.5000 mg | INHALATION_SOLUTION | Freq: Four times a day (QID) | RESPIRATORY_TRACT | Status: DC | PRN

## 2021-06-28 MED ORDER — NITROGLYCERIN 0.4 MG/SPRAY TL SOLN: 1.0000 | Status: DC | PRN

## 2021-06-28 MED ORDER — LEVOTHYROXINE SODIUM 25 MCG PO TABS
25.0000 ug | ORAL_TABLET | Freq: Every day | ORAL | Status: DC
  Administered 2021-06-30 – 2021-07-01 (×2): 25 ug via ORAL
  Filled 2021-06-28 (×2): qty 1

## 2021-06-28 MED ORDER — HYDRALAZINE HCL 20 MG/ML IJ SOLN: 10.0000 mg | Freq: Three times a day (TID) | INTRAMUSCULAR | Status: DC | PRN

## 2021-06-28 MED ORDER — AMLODIPINE BESYLATE 5 MG PO TABS
2.5000 mg | ORAL_TABLET | Freq: Two times a day (BID) | ORAL | Status: DC
Start: 2021-06-28 — End: 2021-06-28
  Administered 2021-06-28: 2.5 mg via ORAL
  Filled 2021-06-28: qty 1

## 2021-06-28 MED ORDER — ISOSORBIDE MONONITRATE ER 30 MG PO TB24
15.0000 mg | ORAL_TABLET | Freq: Every day | ORAL | Status: DC
  Administered 2021-06-29 – 2021-07-01 (×3): 15 mg via ORAL
  Filled 2021-06-28 (×3): qty 1

## 2021-06-28 MED ORDER — RANOLAZINE ER 500 MG PO TB12
500.0000 mg | ORAL_TABLET | Freq: Two times a day (BID) | ORAL | Status: DC
  Administered 2021-06-28 – 2021-07-01 (×6): 500 mg via ORAL
  Filled 2021-06-28 (×7): qty 1

## 2021-06-28 MED ORDER — METOPROLOL SUCCINATE ER 25 MG PO TB24
25.0000 mg | ORAL_TABLET | Freq: Every day | ORAL | Status: DC
Start: 2021-06-29 — End: 2021-07-01
  Administered 2021-06-29 – 2021-07-01 (×3): 25 mg via ORAL
  Filled 2021-06-28 (×3): qty 1

## 2021-06-28 MED ORDER — APIXABAN 5 MG PO TABS
5.0000 mg | ORAL_TABLET | Freq: Two times a day (BID) | ORAL | Status: DC
  Administered 2021-06-28: 5 mg via ORAL
  Filled 2021-06-28: qty 1

## 2021-06-28 MED ORDER — ACETAMINOPHEN 650 MG RE SUPP: 650.0000 mg | RECTAL | Status: DC | PRN

## 2021-06-28 NOTE — ED Notes (Signed)
Patient missed dose of eliquis yesterday morning 2/17. Takes twice a day, morning and night.

## 2021-06-28 NOTE — ED Notes (Signed)
Patient transported to CT 

## 2021-06-28 NOTE — H&P (Signed)
History and Physical    Patient: Jorge Cherry DOB: January 11, 1936 DOA: 06/28/2021 DOS: the patient was seen and examined on 06/28/2021 PCP: Chilton Greathouse, MD  Patient coming from: Home  Chief Complaint:  Chief Complaint  Patient presents with   Dizziness    HPI: Jorge Cherry is a 86 y.o. male with medical history significant of CAD, CKD,Atrial fibrillation.   Family at bedside, wife and son.  Pt brought to ed via ems for double vision and unsteady gait that start at 3 pm today./  Pt got dizzy while putting lights on trailer, pt was then sitting in truck and while walking in the home he was holding on to side of door and was unsteady at same time. Pt also had slurred speech at same time. This is first time this has happened.  Pt had chest pain one time in the cold about 4 days ago.  Pt sees. Dr kelley-cardiology, pt did take his NTG that day.  Pts symptoms resolved few minutes after EMS arrived as fas as slurred speech.  Pt forget his medicine last night.  Chart review shows pt has had MR of abdomen which shows pancreatic cyst and recommendation of repeat imaging in 3-6 months.    Review of Systems:  Review of Systems  Eyes:  Positive for double vision.  Cardiovascular:  Positive for chest pain.  Neurological:  Positive for dizziness, speech change, focal weakness and weakness.    Past Medical History:  Diagnosis Date   Arthritis    "just a touch in my hands" (11/24/2016)   Asthma    Atherosclerosis of renal artery (HCC)    RENAL DOPPLER, 12/10/2011 - Left renal artery demonstrated narrowing with elevated velocities consistent with a 1-59% diameter reduction   CKD (chronic kidney disease) stage 3, GFR 30-59 ml/min (HCC)    "stable now since they backed off the water pills" (11/24/2016)   Coronary artery disease    a. 1994 s/p CABG x 4 (LIMA-LAD, VG->D2, VG->OM, VG->RCA); b. 02/2004 PCI SVG-D2 (3.5x16 Taxus DES). VG->RCA 100. Sev apical LAD dzs distal to LIMA  insertion; c. 11/2016 Cath: LAD 95p/180m, D2 100ost, LCX 100ost, 70p/m, RCA 100p/m, RPDA fills via L->R collats. VG->RCA 100, VG->D2 20 ost, patent prox stent, 80m, LIMA->LAD ok, VG->OM3 20p. EF 55%-->Med Rx.   GERD (gastroesophageal reflux disease)    High cholesterol    History of lower GI bleeding    a. 09/2014 GIB due to diverticulosis/diverticulitis.   Iron deficiency anemia    Labile Hypertension    Moderate aortic stenosis    a. 10/2011 Echo:  EF >55%, mild-mod TR, mild-mod AS, mod Ca2+ of AoV leaflets; b. 07/2016 Echo: EF 60-65%, no rwma, Gr1 DD, mod AS [(S) mean grad , peak grad . Valve area (VTI): 1.33cm^2, (Vmax) 1.44cm^2. Mild MR]; c. 02/2018 Echo: EF 55-60%, no rwma, GR1 DD, Mod AS [Peak Vel (S): 319cm/s, Mean grad (S) , peak grad (S) 58mmHg].   PAF (paroxysmal atrial fibrillation) (HCC)    a. CHA2DS2VASc = 4-->Eliquis.   S/P TAVR (transcatheter aortic valve replacement) 11/14/2019   s/p TAVR with a 26 mm Edwards S3U via the TF approach by Drs Clifton James & Bartle   Sleep apnea    Past Surgical History:  Procedure Laterality Date   CARDIAC CATHETERIZATION  02/27/2004   Coronary intervention and medical management   CARDIAC CATHETERIZATION  11/24/2016   CARDIOVERSION N/A 12/20/2019   Procedure: CARDIOVERSION;  Surgeon: Chilton Si, MD;  Location[facility] ENDOSCOPY;  Service: Cardiovascular;  Laterality: N/A;   CARDIOVERSION N/A 02/05/2020   Procedure: CARDIOVERSION;  Surgeon: Josue Hector, MD;  Location: [facility] ENDOSCOPY;  Service: Cardiovascular;  Laterality: N/A;   CATARACT EXTRACTION W/ INTRAOCULAR LENS IMPLANT Left    CORONARY ANGIOPLASTY  1994 X 2   "before bypass surgery"   CORONARY ANGIOPLASTY WITH STENT PLACEMENT  03/04/2004   SVG supplying the diagonal vessel stented with a 3.5x90mm Taxus stent post dilated to 4.0 mm   CORONARY ARTERY BYPASS GRAFT  1994   "CABG X4"   INGUINAL HERNIA REPAIR Right    PACEMAKER IMPLANT N/A 11/20/2019   Procedure: PACEMAKER  IMPLANT;  Surgeon: Deboraha Sprang, MD;  Location: [place] CV LAB;  Service: Cardiovascular;  Laterality: N/A;   RIGHT HEART CATH AND CORONARY/GRAFT ANGIOGRAPHY N/A 11/24/2016   Procedure: Right Heart Cath and Coronary/Graft Angiography;  Surgeon: Troy Sine, MD;  Location: [place] [facility];  Service: Cardiovascular;  Laterality: N/A;   RIGHT/LEFT HEART CATH AND CORONARY/GRAFT ANGIOGRAPHY N/A 10/18/2019   Procedure: RIGHT/LEFT HEART CATH AND CORONARY/GRAFT ANGIOGRAPHY;  Surgeon: Troy Sine, MD;  Location: [place] [facility] LAB;  Service: Cardiovascular;  Laterality: N/A;   TEE WITHOUT CARDIOVERSION N/A 11/14/2019   Procedure: TRANSESOPHAGEAL ECHOCARDIOGRAM (TEE);  Surgeon: Burnell Blanks, MD;  Location: [place] [facility];  Service: Open Heart Surgery;  Laterality: N/A;   TONSILLECTOMY AND ADENOIDECTOMY     TRANSCATHETER AORTIC VALVE REPLACEMENT, TRANSFEMORAL N/A 11/14/2019   Procedure: TRANSCATHETER AORTIC VALVE REPLACEMENT, TRANSFEMORAL;  Surgeon: Burnell Blanks, MD;  Location: [place] [facility] LAB;  Service: Open Heart Surgery;  Laterality: N/A;   Social History:  reports that he has never smoked. He has never used smokeless tobacco. He reports that he does not drink alcohol and does not use drugs.  Allergies  Allergen Reactions   Altace [Ramipril] Swelling and Other (See Comments)    Mouth swelling   Mucinex [Guaifenesin Er] Hives, Swelling and Other (See Comments)    Mouth swelling   Azithromycin     Other reaction(s): Does not work   Contrast Media [Iodinated Contrast Media] Other (See Comments)    Made eyes change each time    Family History  Problem Relation Age of Onset   Cancer Mother    Heart disease Father        died age 10   Stroke Maternal Grandmother    Cancer Maternal Grandfather    Allergic rhinitis Neg Hx    Angioedema Neg Hx    Asthma Neg Hx    Atopy Neg Hx    Eczema Neg Hx    Immunodeficiency Neg Hx    Urticaria Neg Hx     Prior to  Admission medications   Medication Sig Start Date End Date Taking? Authorizing Provider  amLODipine (NORVASC) 2.5 MG tablet TAKE 1 TABLET BY MOUTH TWICE A DAY 05/13/21  Yes Troy Sine, MD  atorvastatin (LIPITOR) 20 MG tablet Take 1 tablet (20 mg total) by mouth at bedtime. 03/16/18  Yes Bonnell Public, MD  ELIQUIS 5 MG TABS tablet TAKE 1 TABLET BY MOUTH TWICE A DAY 01/06/21  Yes Troy Sine, MD  ezetimibe (ZETIA) 10 MG tablet TAKE 1 TABLET BY MOUTH EVERY DAY 07/22/20  Yes Troy Sine, MD  fenofibrate (TRICOR) 145 MG tablet TAKE 1/2 TABLETS BY MOUTH DAILY. 08/19/20  Yes Troy Sine, MD  fluticasone (FLONASE) 50 MCG/ACT nasal spray Place 2 sprays into both nostrils daily as needed  for allergies or rhinitis.    Yes [provider]  furosemide (LASIX) 40 MG tablet Take 1 tablet (40 mg) in the morning. You may take an extra 1/2 tablet (20 mg) in the evening as needed for swelling. 03/19/21  Yes Troy Sine, MD  ipratropium (ATROVENT) 0.03 % nasal spray Place 2 sprays into both nostrils 2 (two) times daily. 03/04/21  Yes Kozlow, Donnamarie Poag, MD  isosorbide mononitrate (IMDUR) 60 MG 24 hr tablet Take 2 tablets in the morning. Take 1 tablet in the evening. 03/19/21  Yes Troy Sine, MD  levothyroxine (SYNTHROID) 25 MCG tablet Take 25 mcg by mouth daily before breakfast.  07/08/19  Yes [provider]  metoprolol succinate (TOPROL-XL) 50 MG 24 hr tablet TAKE 0.5 TABLETS (25 MG TOTAL) BY MOUTH DAILY. TAKE WITH OR IMMEDIATELY FOLLOWING A MEAL. 05/13/21 08/11/21 Yes Troy Sine, MD  Omega-3 Fatty Acids (FISH OIL) 1000 MG CAPS Take 1,000 mg by mouth 2 (two) times daily.    Yes [provider]  ranolazine (RANEXA) 500 MG 12 hr tablet TAKE 1 TABLET BY MOUTH TWICE A DAY 12/06/20  Yes Troy Sine, MD  vitamin E 400 UNIT capsule Take 400 Units by mouth daily.   Yes [provider]  albuterol (VENTOLIN HFA) 108 (90 Base) MCG/ACT inhaler 2 inhalations every 4 - 6  hours as needed 03/04/21   Kozlow, Donnamarie Poag, MD  DYMISTA 137-50 MCG/ACT SUSP Place 1-2 sprays into both nostrils daily as needed. 04/22/21   [provider]  loratadine (CLARITIN) 10 MG tablet Take 1 tablet (10 mg total) by mouth 2 (two) times daily as needed for allergies (Can use an extra dose during flares). 03/04/21   Kozlow, Donnamarie Poag, MD  Multiple Vitamins-Minerals (CENTRUM SILVER 50+MEN) TABS Take 1 tablet by mouth at bedtime.    [provider]  nitroGLYCERIN (NITROLINGUAL) 0.4 MG/SPRAY spray PLACE 1 SPRAY UNDER THE TONGUE EVERY 5 (FIVE) MINUTES X 3 DOSES AS NEEDED FOR CHEST PAIN. 08/06/20   Troy Sine, MD  pantoprazole (PROTONIX) 40 MG tablet Take 1 tablet (40 mg total) by mouth daily. 03/04/21   Kozlow, Donnamarie Poag, MD  tamsulosin (FLOMAX) 0.4 MG CAPS capsule Take 0.4 mg by mouth at bedtime. 04/22/21   [provider]  vitamin C (ASCORBIC ACID) 500 MG tablet Take 500 mg by mouth at bedtime.     [provider]    Physical Exam: Vitals:   06/28/21 2115 06/28/21 2130 06/28/21 2200 06/28/21 2215  BP:  (!) 159/93 (!) 176/94 (!) 176/94  Pulse: 85 73 74   Resp: 18 15 (!) 21   Temp:   98 F (36.7 C)   TempSrc:   Oral   SpO2: 94% 95% 96%   Weight:      Height:      Physical Exam Vitals reviewed.  Constitutional:      General: He is awake. He is not in acute distress.    Appearance: He is not ill-appearing.  HENT:     Head: Normocephalic and atraumatic.     Right Ear: External ear normal. Decreased hearing noted.     Left Ear: External ear normal. Decreased hearing noted.     Nose: Nose normal.     Mouth/Throat:     Lips: Pink.     Mouth: Mucous membranes are moist.     Tongue: No lesions. Tongue does not deviate from midline.  Eyes:     General: Lids are  normal. Visual field deficit present.     Extraocular Movements:     Right eye: Abnormal extraocular motion present.     Conjunctiva/sclera: Conjunctivae normal.     Pupils: Pupils are equal,  round, and reactive to light. Pupils are equal.      Comments: Rt eye adduction impaired.   Neck:     Vascular: No carotid bruit.  Cardiovascular:     Rate and Rhythm: Normal rate and regular rhythm.     Pulses: Normal pulses.          Dorsalis pedis pulses are 2+ on the right side and 2+ on the left side.       Posterior tibial pulses are 2+ on the right side and 2+ on the left side.     Heart sounds: Normal heart sounds.  Pulmonary:     Effort: Pulmonary effort is normal.     Breath sounds: Normal breath sounds and air entry.  Abdominal:     General: Abdomen is protuberant. Bowel sounds are normal. There is no distension.     Palpations: Abdomen is soft. There is no mass.     Tenderness: There is no abdominal tenderness. There is no guarding.     Hernia: No hernia is present.  Musculoskeletal:     Cervical back: Normal range of motion. No rigidity or tenderness.     Right lower leg: Edema present.     Left lower leg: No edema.  Lymphadenopathy:     Cervical: No cervical adenopathy.  Skin:    General: Skin is warm.  Neurological:     General: No focal deficit present.     Mental Status: He is alert and oriented to person, place, and time.     Cranial Nerves: Cranial nerve deficit present.     Sensory: No sensory deficit.     Motor: No weakness, tremor or abnormal muscle tone.     Coordination: Coordination normal. Finger-Nose-Finger Test and Heel to [facility] Test normal.     Deep Tendon Reflexes: Reflexes normal.     Reflex Scores:      Bicep reflexes are 2+ on the right side and 2+ on the left side.      Patellar reflexes are 2+ on the right side and 2+ on the left side. Psychiatric:        Mood and Affect: Mood normal.        Behavior: Behavior normal. Behavior is cooperative.   Data Reviewed: >CMP shows potassium of 3.3 and creatinine of 1.43. >CBC Normal wbc of 8.3, hb of 12.5, platelet of 160. >Resp panel negative for flu and covid.  >Initial Head ct  noncontrast: 1.  No evidence of acute intracranial abnormality. ASPECTS is 10. 2. Remote right occipital infarct and moderate to advanced chronic microvascular ischemic disease. 3. Cerebral Atrophy (ICD10-G31.9). > CTA bran / neck Pending.  > CT head noncontrast repeat 24 hours later- ordered.   Assessment and Plan: Diplopia/ Gait disturbance /Slurred speech / Dizziness: -suspect new cva- no known history of cva per patient  -neurology has been consulted and plan as below. - CTA head and neck pending. - Recommend TTE. - Recommend labs: HbA1c, lipid panel - ordered. - Recommend Statin for goal LDL <70. - Goal A1c <7. - Continue apixaban. - SBP goal <180- continued metoprolol/ imdur and held amlodipine and PRN hydralazine q8h. - Telemetry monitoring. - Recommend bedside Swallow screen. - Recommend Stroke education. - Recommend PT/OT/SLP consult.   Chest pain/ CAD/CHF/ A.fib: -  pt describes intermittent chest pain that he occasionally uses NTG nad primary cardiology aware of this. - no worsening or any difference.  - clinically euvolemic. - will cont Imdur/ metoprolol/ statin and eliquis. - will get troponin, ekg shows  a.fib at 68 with LBBB and st depression in  lead ii.  HTN: Blood pressure (!) 176/94, pulse 74, temperature 98 F (36.7 C), temperature source Oral, resp. rate (!) 21, height 5\' 8"  (1.727 m), weight 88.5 kg, SpO2 96 %. -current BP goal is SBP 180 and below per neurologist.  - will stop amlodipine/ cut back on imdur and cont metoprolol.  CKD: Lab Results  Component Value Date   CREATININE 1.43 (H) 06/28/2021   CREATININE 1.71 (H) 04/23/2021   CREATININE 1.53 (H) 11/07/2020  Stable. Avoid contrast.  Renally dose all meds.   Anemia: CBC 06/28/2021 11/07/2020 02/05/2020  WBC 8.3 6.8 6.4  Hemoglobin 12.5(L) 13.1 12.5(L)  Hematocrit 38.8(L) 40.2 40.7  Platelets 160 158 156  We will follow.  Hypothyroidism: We will check FT4 along with TSH and cont levothyroxine at 25 mcg  daily.   Advance Care Planning:   Code Status: Full Code   Consults:  Neurology- Dr. Lynnae Sandhoff.  Family Communication:  Ronon, Gesler (Spouse)  [phone] (Mobile  Severity of Illness: The appropriate patient status for this patient is OBSERVATION. Observation status is judged to be reasonable and necessary in order to provide the required intensity of service to ensure the patient's safety. The patient's presenting symptoms, physical exam findings, and initial radiographic and laboratory data in the context of their medical condition is felt to place them at decreased risk for further clinical deterioration. Furthermore, it is anticipated that the patient will be medically stable for discharge from the hospital within 2 midnights of admission.   Author: Para Skeans, MD 06/28/2021 10:39 PM  For on call review [email or web address].

## 2021-06-28 NOTE — ED Notes (Signed)
Neurologist in [place] upon arrival

## 2021-06-28 NOTE — Progress Notes (Signed)
I responded to a Code Stroke page to provide spiritual support for the patient and his family. I arrived at the patient's room where his wife, Dewayne Hatch, was waiting for him to return from CT. I shared words of encouragement and led in prayer. I waited with Dewayne Hatch until Zackariah returned to the room and their son arrived. I provided spiritual care through pastoral presence, words of encouragement, and prayer.    06/28/21 1900  Clinical Encounter Type  Visited With Patient and family together  Visit Type Initial;Spiritual support;Code  Referral From Nurse  Consult/Referral To Chaplain  Spiritual Encounters  Spiritual Needs Prayer    Chaplain Dr Melvyn Novas

## 2021-06-28 NOTE — ED Triage Notes (Signed)
Pt states he has had double vision and dizziness for the last hour- pt did miss his night time meds last night and was late with his morning meds- pt did also not use his cpap last night- pt A&O x4- pt able to speak in clear sentences- face symmetrical- pt does take eloquis

## 2021-06-28 NOTE — ED Provider Notes (Signed)
[facility] Provider Note    Event Date/Time   First MD Initiated Contact with Patient 06/28/21 1656     (approximate)   History   Dizziness   HPI  Jorge Cherry is a 86 y.o. male with a PMH of CAD status post CABG, atrial fibrillation on Eliquis, hypertension, hyperlipidemia, CKD, aortic stenosis status post TAVR and pacemaker placement who presents with what he initially describes as dizziness, however on further history the patient specifies that around 315 he was doing some light work on a vehicle, when he started to have double vision particularly when he looks at things at certain angles.  This has persisted since that time.  He denies any sensation of vertigo or things spinning around him.  He denies near syncope.  He has no headache.  He denies any prior history of double vision like this.     Physical Exam   Triage Vital Signs: ED Triage Vitals  Enc Vitals Group     BP 06/28/21 1640 (!) 155/91     Pulse Rate 06/28/21 1640 64     Resp 06/28/21 1640 18     Temp 06/28/21 1640 97.6 F (36.4 C)     Temp Source 06/28/21 1640 Oral     SpO2 06/28/21 1640 95 %     Weight 06/28/21 1635 195 lb (88.5 kg)     Height 06/28/21 1635 5\' 8"  (1.727 m)     Head Circumference --      Peak Flow --      Pain Score 06/28/21 1635 0     Pain Loc --      Pain Edu? --      Excl. in GC? --     Most recent vital signs: Vitals:   06/28/21 1845 06/28/21 1900  BP: (!) 169/103 (!) 180/100  Pulse: 63 69  Resp: 19 19  Temp:    SpO2: 97% 96%     General: Alert, well appearing for age, no distress. CV:  Good peripheral perfusion.  Resp:  Normal effort.  Abd:  No distention.  Other:  5/5 motor strength and intact sensation of bilateral upper and lower extremities.  PERRLA.  Right medial gaze palsy.  Extraocular movements otherwise intact.  Normal coordination with no ataxia on finger-to-nose.  No pronator drift.   ED Results / Procedures / Treatments    Labs (all labs ordered are listed, but only abnormal results are displayed) Labs Reviewed  CBC WITH DIFFERENTIAL/PLATELET - Abnormal; Notable for the following components:      Result Value   RBC 4.21 (*)    Hemoglobin 12.5 (*)    HCT 38.8 (*)    All other components within normal limits  COMPREHENSIVE METABOLIC PANEL - Abnormal; Notable for the following components:   Potassium 3.3 (*)    Glucose, Bld 116 (*)    BUN 26 (*)    Creatinine, Ser 1.43 (*)    GFR, Estimated 49 (*)    All other components within normal limits  URINALYSIS, ROUTINE W REFLEX MICROSCOPIC - Abnormal; Notable for the following components:   Color, Urine COLORLESS (*)    APPearance CLEAR (*)    All other components within normal limits  RESP PANEL BY RT-PCR (FLU A&B, COVID) [id number]  PROTIME-INR  APTT     EKG  ED ECG REPORT I, 06/30/21, the attending physician, personally viewed and interpreted this ECG.  Date: 06/28/2021 EKG Time: 1649 Rate: 68 Rhythm: Atrial fibrillation QRS  Axis: normal Intervals: LBBB ST/T Wave abnormalities: Nonspecific ST abnormalities Narrative Interpretation: Nonspecific abnormalities with no evidence of acute ischemia    RADIOLOGY  CT head: I viewed and interpreted the images; there is no evidence of ICH.  Radiology report confirms no acute findings.  PROCEDURES:  Critical Care performed: Yes, see critical care procedure note(s)  .Critical Care Performed by: Dionne Bucy, MD Authorized by: Dionne Bucy, MD   Critical care provider statement:    Critical care time (minutes):  30   Critical care was necessary to treat or prevent imminent or life-threatening deterioration of the following conditions:  CNS failure or compromise   Critical care was time spent personally by me on the following activities:  Development of treatment plan with patient or surrogate, discussions with consultants, evaluation of patient's response to treatment,  examination of patient, ordering and review of laboratory studies, ordering and review of radiographic studies, ordering and performing treatments and interventions, pulse oximetry, re-evaluation of patient's condition and review of old charts    MEDICATIONS ORDERED IN ED: Medications  diphenhydrAMINE (BENADRYL) injection 50 mg (has no administration in time range)  hydrocortisone sodium succinate (SOLU-CORTEF) injection 200 mg (200 mg Intravenous Given 06/28/21 1826)     IMPRESSION / MDM / ASSESSMENT AND PLAN / ED COURSE  I reviewed the triage vital signs and the nursing notes.  86 year old male with PMH as noted above including CAD s/p CABG, atrial fibrillation on Eliquis, hypertension, hyperlipidemia, CKD, aortic stenosis s/p TAVR and pacemaker placement presents with acute onset of diplopia around 315.  Exam is significant for right eye medial gaze palsy.  Neurologic exam is otherwise normal.  The diplopia resolves when the patient covers either eye.  Differential diagnosis includes, but is not limited to, acute ischemic CVA, intracranial hemorrhage, aneurysm, or less likely mass or tumor.  Immediately upon evaluating the patient and perform neurologic exam, I activated code stroke at 1742.  We will obtain CT head, lab work-up, urgent neurology consultation, and reassess.  The patient cannot get an MRI here due to his pacemaker.  He would benefit from a CT angio, however he has an allergy to IV contrast (on further discussion with the patient the nature of the allergy is unclear but it appears to have caused significant vision problems requiring procedural intervention, and he has been successfully pretreated when needing IV contrast previously.)  Therefore I will start the pretreatment protocol in anticipation of a CT angio.  The patient is on the cardiac monitor to evaluate for evidence of arrhythmia and/or significant heart rate changes.  ----------------------------------------- 7:00  PM on 06/28/2021 -----------------------------------------  CT without contrast shows no acute abnormality.  I consulted with Dr. Thomasena Edis from neurology who evaluated the patient.  He advises that the patient is on Eliquis and is not a candidate for tPA.  However, he does believe that the patient likely had an acute ischemic CVA.  He agrees with the plan for CT angio after pretreatment protocol, and admission to the hospital.  I consulted Dr. Allena Katz from the hospitalist service; based on our discussion she agrees to admit the patient.   FINAL CLINICAL IMPRESSION(S) / ED DIAGNOSES   Final diagnoses:  Acute CVA (cerebrovascular accident) (HCC)  Gaze palsy     Rx / DC Orders   ED Discharge Orders     None        Note:  This document was prepared using Dragon voice recognition software and may include unintentional dictation errors.  Dionne Bucy, MD 06/28/21 1911

## 2021-06-28 NOTE — Consult Note (Addendum)
Neurology Stroke Consult H&P  [name] MR# [id number] 06/28/2021   CC: dizziness  History is obtained from: Patient, wife and chart.  HPI: Jorge Cherry is a 86 y.o. male PMHx as reviewed below atrial fibrillation on apixaban developed double vision and dizziness ~1540. Dizziness resolved however, double vision remains.   He stated that he missed his morning dose of apixaban, was driving and decided to not to go back home to get it. He took his morning dose today.  He has difficulty hearing and wears hearing aids.  LKW: 1540 tNK given: No OSW IR Thrombectomy No, not indicated Modified Rankin Scale: 0-Completely asymptomatic and back to baseline post- stroke NIHSS: 1  ROS: A complete ROS was performed and is negative except as noted in the HPI.   Past Medical History:  Diagnosis Date   Arthritis    "just a touch in my hands" (11/24/2016)   Asthma    Atherosclerosis of renal artery ([place])    RENAL DOPPLER, 12/10/2011 - Left renal artery demonstrated narrowing with elevated velocities consistent with a 1-59% diameter reduction   CKD (chronic kidney disease) stage 3, GFR 30-59 ml/min (HCC)    "stable now since they backed off the water pills" (11/24/2016)   Coronary artery disease    a. 1994 s/p CABG x 4 (LIMA-LAD, VG->D2, VG->OM, VG->RCA); b. 02/2004 PCI SVG-D2 (3.5x16 Taxus DES). VG->RCA 100. Sev apical LAD dzs distal to LIMA insertion; c. 11/2016 Cath: LAD 95p/18m, D2 100ost, LCX 100ost, 70p/m, RCA 100p/m, RPDA fills via L->R collats. VG->RCA 100, VG->D2 20 ost, patent prox stent, 7m, LIMA->LAD ok, VG->OM3 20p. EF 55%-->Med Rx.   GERD (gastroesophageal reflux disease)    High cholesterol    History of lower GI bleeding    a. 09/2014 GIB due to diverticulosis/diverticulitis.   Iron deficiency anemia    Labile Hypertension    Moderate aortic stenosis    a. 10/2011 Echo:  EF >55%, mild-mod TR, mild-mod AS, mod Ca2+ of AoV leaflets; b. 07/2016 Echo: EF 60-65%, no rwma, Gr1 DD, mod AS  [(S) mean grad 6mmHg, peak grad 39mmHg. Valve area (VTI): 1.33cm^2, (Vmax) 1.44cm^2. Mild MR]; c. 02/2018 Echo: EF 55-60%, no rwma, GR1 DD, Mod AS [Peak Vel (S): 319cm/s, Mean grad (S) 64mmHg, peak grad (S) 56mmHg].   PAF (paroxysmal atrial fibrillation) (HCC)    a. CHA2DS2VASc = 4-->Eliquis.   S/P TAVR (transcatheter aortic valve replacement) 11/14/2019   s/p TAVR with a 26 mm Edwards S3U via the TF approach by Drs Angelena Form & Bartle   Sleep apnea      Family History  Problem Relation Age of Onset   Cancer Mother    Heart disease Father        died age 25   Stroke Maternal Grandmother    Cancer Maternal Grandfather    Allergic rhinitis Neg Hx    Angioedema Neg Hx    Asthma Neg Hx    Atopy Neg Hx    Eczema Neg Hx    Immunodeficiency Neg Hx    Urticaria Neg Hx     Social History:  reports that he has never smoked. He has never used smokeless tobacco. He reports that he does not drink alcohol and does not use drugs.   Prior to Admission medications   Medication Sig Start Date End Date Taking? Authorizing Provider  amLODipine (NORVASC) 2.5 MG tablet TAKE 1 TABLET BY MOUTH TWICE A DAY 05/13/21  Yes Troy Sine, MD  atorvastatin (LIPITOR) 20  MG tablet Take 1 tablet (20 mg total) by mouth at bedtime. 03/16/18  Yes Bonnell Public, MD  ELIQUIS 5 MG TABS tablet TAKE 1 TABLET BY MOUTH TWICE A DAY 01/06/21  Yes Troy Sine, MD  ezetimibe (ZETIA) 10 MG tablet TAKE 1 TABLET BY MOUTH EVERY DAY 07/22/20  Yes Troy Sine, MD  fenofibrate (TRICOR) 145 MG tablet TAKE 1/2 TABLETS BY MOUTH DAILY. 08/19/20  Yes Troy Sine, MD  fluticasone (FLONASE) 50 MCG/ACT nasal spray Place 2 sprays into both nostrils daily as needed for allergies or rhinitis.    Yes [provider]  furosemide (LASIX) 40 MG tablet Take 1 tablet (40 mg) in the morning. You may take an extra 1/2 tablet (20 mg) in the evening as needed for swelling. 03/19/21  Yes Troy Sine, MD  ipratropium (ATROVENT)  0.03 % nasal spray Place 2 sprays into both nostrils 2 (two) times daily. 03/04/21  Yes Kozlow, Donnamarie Poag, MD  isosorbide mononitrate (IMDUR) 60 MG 24 hr tablet Take 2 tablets in the morning. Take 1 tablet in the evening. 03/19/21  Yes Troy Sine, MD  levothyroxine (SYNTHROID) 25 MCG tablet Take 25 mcg by mouth daily before breakfast.  07/08/19  Yes [provider]  metoprolol succinate (TOPROL-XL) 50 MG 24 hr tablet TAKE 0.5 TABLETS (25 MG TOTAL) BY MOUTH DAILY. TAKE WITH OR IMMEDIATELY FOLLOWING A MEAL. 05/13/21 08/11/21 Yes Troy Sine, MD  Omega-3 Fatty Acids (FISH OIL) 1000 MG CAPS Take 1,000 mg by mouth 2 (two) times daily.    Yes [provider]  ranolazine (RANEXA) 500 MG 12 hr tablet TAKE 1 TABLET BY MOUTH TWICE A DAY 12/06/20  Yes Troy Sine, MD  vitamin E 400 UNIT capsule Take 400 Units by mouth daily.   Yes [provider]  albuterol (VENTOLIN HFA) 108 (90 Base) MCG/ACT inhaler 2 inhalations every 4 - 6 hours as needed 03/04/21   Kozlow, Donnamarie Poag, MD  DYMISTA 137-50 MCG/ACT SUSP Place 1-2 sprays into both nostrils daily as needed. 04/22/21   [provider]  loratadine (CLARITIN) 10 MG tablet Take 1 tablet (10 mg total) by mouth 2 (two) times daily as needed for allergies (Can use an extra dose during flares). 03/04/21   Kozlow, Donnamarie Poag, MD  Multiple Vitamins-Minerals (CENTRUM SILVER 50+MEN) TABS Take 1 tablet by mouth at bedtime.    [provider]  nitroGLYCERIN (NITROLINGUAL) 0.4 MG/SPRAY spray PLACE 1 SPRAY UNDER THE TONGUE EVERY 5 (FIVE) MINUTES X 3 DOSES AS NEEDED FOR CHEST PAIN. 08/06/20   Troy Sine, MD  pantoprazole (PROTONIX) 40 MG tablet Take 1 tablet (40 mg total) by mouth daily. 03/04/21   Kozlow, Donnamarie Poag, MD  tamsulosin (FLOMAX) 0.4 MG CAPS capsule Take 0.4 mg by mouth at bedtime. 04/22/21   [provider]  vitamin C (ASCORBIC ACID) 500 MG tablet Take 500 mg by mouth at bedtime.     [provider]     Exam: Current vital signs: BP (!) 159/93    Pulse 73    Temp 97.6 F (36.4 C) (Oral)    Resp 15    Ht 5\' 8"  (1.727 m)    Wt 88.5 kg    SpO2 95%    BMI 29.65 kg/m   Physical Exam  Constitutional: Appears well-developed and well-nourished.  Psych: Affect appropriate to situation Eyes: No scleral injection HENT: No OP obstruction. Head: Normocephalic.  Cardiovascular: Normal rate and regular rhythm.  Respiratory: Effort normal, symmetric excursions bilaterally, no audible wheezing. GI: Soft.  No distension. There is no tenderness.  Skin: WDI  Neuro: Mental Status: Patient is awake, alert, oriented to person, place, month, year, and situation. Patient is able to give a clear and coherent history. Speech fluent, intact comprehension and repetition. No signs of aphasia or neglect. Visual Fields are full. Pupils pinpoint round reactive to light. Double vision in the horizontal plane on left gaze gaze.  Right eye restricted to midline on left gaze. EOMI without ptosis or diplopia.  Facial sensation is symmetric to temperature Facial movement is symmetric.  Hearing decreased especially in right ear but grossly is intact to voice. Uvula midline and palate elevates symmetrically. Shoulder shrug is symmetric. Tongue is midline without atrophy or fasciculations.  Tone is normal. Bulk is normal. 5/5 strength was present in all four extremities. Sensation is symmetric to light touch and temperature in the arms and legs. Deep Tendon Reflexes: 2+ and symmetric in the biceps and patellae. Toes are downgoing bilaterally. FNF and HKS are intact bilaterally. Gait - Deferred  I have reviewed labs in epic and the pertinent results are:    I have reviewed the images obtained: NCT head showed no acute ischemic changes, hemorrhage, mass.  Assessment: Jorge Cherry is a 86 y.o. male PMHx as noted above with atrial fibrillation on apixaban with acute cardio embolic stroke likely to midbrain  due to missed dose of apixaban.    Impression:  Acute cardio embolic stroke Atrial fibrillation On apixaban - Missed dose NIHSS 1 Pacemaker - Not sure if it is MRI compatible.   Plan: - Repeat CT head in the morning. - CTA head and neck pending. - Recommend TTE. - Recommend labs: HbA1c, lipid panel - ordered. - Recommend Statin for goal LDL <70. - Goal A1c <7. - Continue anticoagulation. - SBP goal <180. - Telemetry monitoring. - Recommend bedside Swallow screen. - Recommend Stroke education. - Recommend PT/OT/SLP consult.  This patient is critically ill and at significant risk of neurological worsening, death and care requires constant monitoring of vital signs, hemodynamics,respiratory and cardiac monitoring, neurological assessment, discussion with family, other specialists and medical decision making of high complexity. I spent 75 minutes of neurocritical care time  in the care of  this patient. This was time spent independent of any time provided by nurse practitioner or PA.  Electronically signed by:  Lynnae Sandhoff, MD Page: [phone] 06/28/2021, 9:47 PM  If 7pm- 7am, please page neurology on call as listed in [place].

## 2021-06-28 NOTE — ED Triage Notes (Signed)
First nurse note: pt comes ems from home with weakness. States that he had a funny feeling in the back of his head and bilateral leg weakness. CBG 131. Paced rhythm. States his head no longer hurts but leg weakness is bilateral. Difficult to check vision due to lens transplants. Very HOH. 20G L AC.   HTN 190/100.  97% RA.

## 2021-06-29 ENCOUNTER — Observation Stay (HOSPITAL_COMMUNITY)
Admit: 2021-06-29 | Discharge: 2021-06-29 | Disposition: A | Discharge status: Home / Self Care | Payer: Medicare PPO | Attending: Internal Medicine | Admitting: Internal Medicine

## 2021-06-29 ENCOUNTER — Other Ambulatory Visit: Payer: Self-pay

## 2021-06-29 ENCOUNTER — Observation Stay: Payer: Medicare PPO

## 2021-06-29 DIAGNOSIS — H510 Palsy (spasm) of conjugate gaze: Secondary | ICD-10-CM | POA: Diagnosis not present

## 2021-06-29 DIAGNOSIS — N183 Chronic kidney disease, stage 3 unspecified: Secondary | ICD-10-CM | POA: Diagnosis not present

## 2021-06-29 DIAGNOSIS — R42 Dizziness and giddiness: Secondary | ICD-10-CM

## 2021-06-29 DIAGNOSIS — I6389 Other cerebral infarction: Secondary | ICD-10-CM

## 2021-06-29 DIAGNOSIS — I672 Cerebral atherosclerosis: Secondary | ICD-10-CM | POA: Diagnosis not present

## 2021-06-29 DIAGNOSIS — I5032 Chronic diastolic (congestive) heart failure: Secondary | ICD-10-CM

## 2021-06-29 DIAGNOSIS — Z950 Presence of cardiac pacemaker: Secondary | ICD-10-CM | POA: Diagnosis not present

## 2021-06-29 DIAGNOSIS — G839 Paralytic syndrome, unspecified: Secondary | ICD-10-CM | POA: Diagnosis not present

## 2021-06-29 DIAGNOSIS — Z8673 Personal history of transient ischemic attack (TIA), and cerebral infarction without residual deficits: Secondary | ICD-10-CM | POA: Diagnosis not present

## 2021-06-29 DIAGNOSIS — I10 Essential (primary) hypertension: Secondary | ICD-10-CM

## 2021-06-29 DIAGNOSIS — I480 Paroxysmal atrial fibrillation: Secondary | ICD-10-CM

## 2021-06-29 DIAGNOSIS — I2510 Atherosclerotic heart disease of native coronary artery without angina pectoris: Secondary | ICD-10-CM

## 2021-06-29 DIAGNOSIS — I482 Chronic atrial fibrillation, unspecified: Secondary | ICD-10-CM | POA: Diagnosis not present

## 2021-06-29 DIAGNOSIS — H532 Diplopia: Secondary | ICD-10-CM | POA: Diagnosis not present

## 2021-06-29 DIAGNOSIS — I639 Cerebral infarction, unspecified: Secondary | ICD-10-CM | POA: Diagnosis not present

## 2021-06-29 DIAGNOSIS — I726 Aneurysm of vertebral artery: Secondary | ICD-10-CM

## 2021-06-29 LAB — CBC
HCT: 40.3 % (ref 39.0–52.0)
Hemoglobin: 13.1 g/dL (ref 13.0–17.0)
MCH: 29.6 pg (ref 26.0–34.0)
MCHC: 32.5 g/dL (ref 30.0–36.0)
MCV: 91.2 fL (ref 80.0–100.0)
Platelets: 161 10*3/uL (ref 150–400)
RBC: 4.42 MIL/uL (ref 4.22–5.81)
RDW: 14.1 % (ref 11.5–15.5)
WBC: 7.8 10*3/uL (ref 4.0–10.5)
nRBC: 0 % (ref 0.0–0.2)

## 2021-06-29 LAB — HEPARIN LEVEL (UNFRACTIONATED): Heparin Unfractionated: >1.1 IU/mL — ABNORMAL HIGH (ref 0.30–0.70)

## 2021-06-29 LAB — LIPID PANEL
Cholesterol: 136 mg/dL (ref 0–200)
HDL: 43 mg/dL (ref >40)
LDL Cholesterol: 79 mg/dL (ref 0–99)
Total CHOL/HDL Ratio: 3.2 RATIO
Triglycerides: 71 mg/dL (ref <150)
VLDL: 14 mg/dL (ref 0–40)

## 2021-06-29 LAB — ECHOCARDIOGRAM COMPLETE BUBBLE STUDY
AR max vel: 1.62 cm2
AV Area VTI: 1.58 cm2
AV Area mean vel: 1.6 cm2
AV Mean grad: 7 mmHg
AV Peak grad: 13.2 mmHg
Ao pk vel: 1.82 m/s
Area-P 1/2: 3.65 cm2
S' Lateral: 4.16 cm

## 2021-06-29 LAB — TSH: TSH: 1.671 u[IU]/mL (ref 0.350–4.500)

## 2021-06-29 LAB — APTT: aPTT: 58 seconds — ABNORMAL HIGH (ref 24–36)

## 2021-06-29 LAB — HEMOGLOBIN A1C
Hgb A1c MFr Bld: 5.5 % (ref 4.8–5.6)
Mean Plasma Glucose: 111.15 mg/dL

## 2021-06-29 LAB — TROPONIN I (HIGH SENSITIVITY)
Troponin I (High Sensitivity): 26 ng/L — ABNORMAL HIGH (ref <18)
Troponin I (High Sensitivity): 27 ng/L — ABNORMAL HIGH (ref <18)

## 2021-06-29 IMAGING — CT CT HEAD W/O CM
4 series · 16 of 47 positions shown, 18 images · non-contrast
Comparison: CT studies done yesterday.

CLINICAL DATA: Stroke, follow-up. Isolated right medial rectus
palsy. Diplopia.



[Series 2: head wo · axial · 0.46mm/px · z∈[-102,+28]mm · 7 of 36 slices shown, 9 images]
[im 5/36  brain]
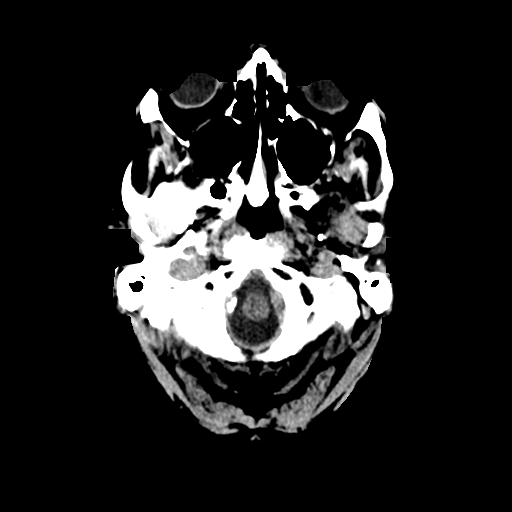
[im 5/36  bone]
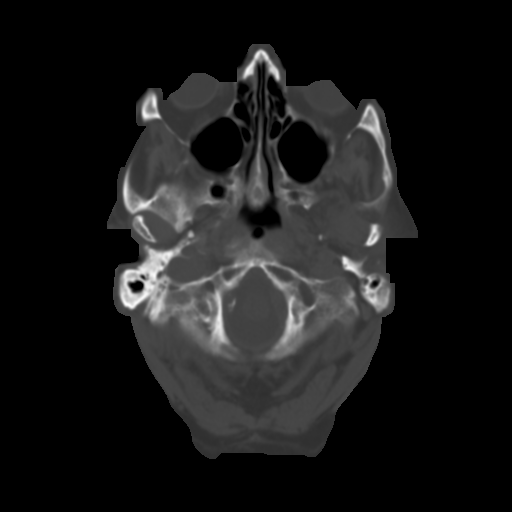
[im 9/36  brain]
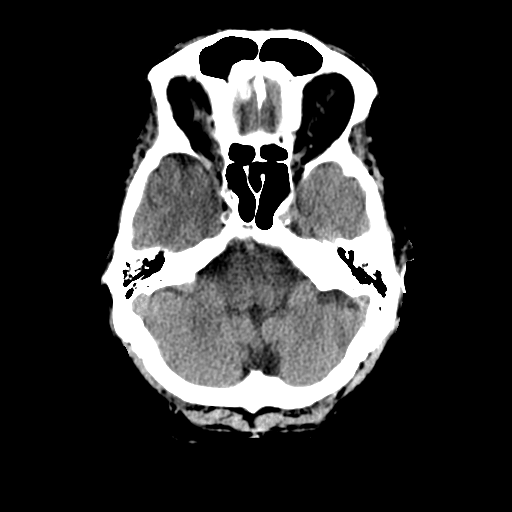
[im 14/36  brain]
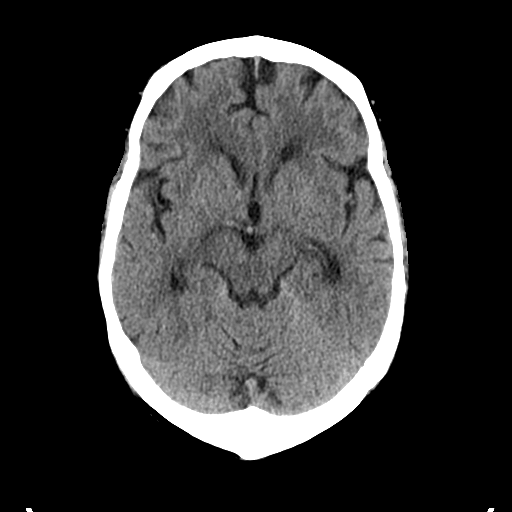
[im 18/36  brain]
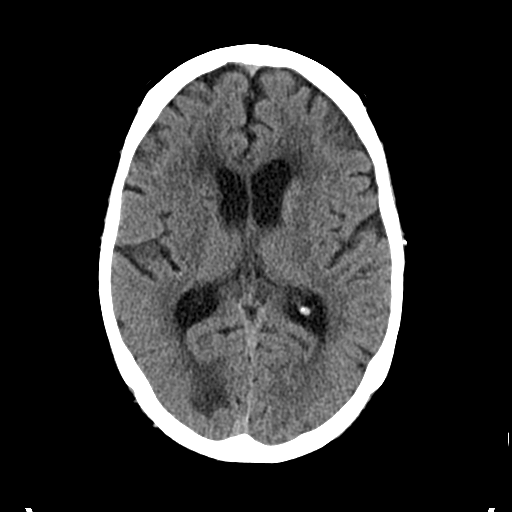
[im 22/36  brain]
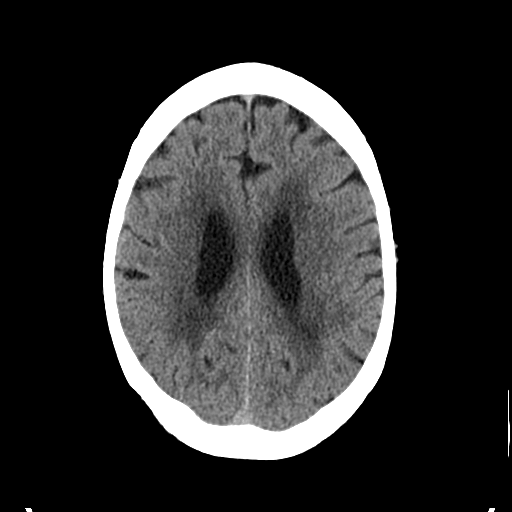
[im 22/36  bone]
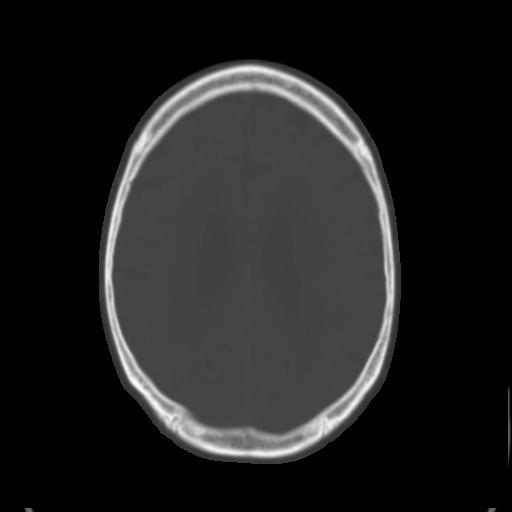
[im 27/36  brain]
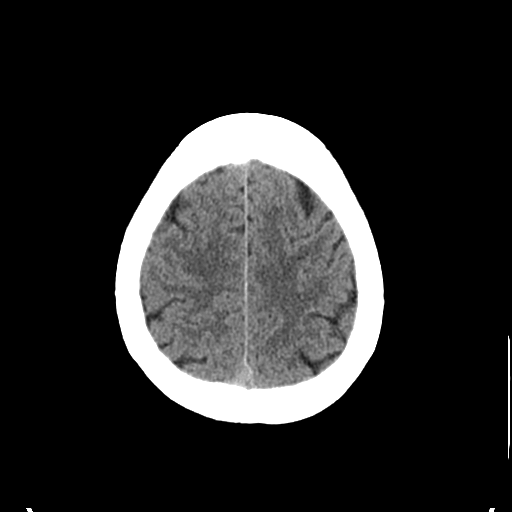
[im 31/36  brain]
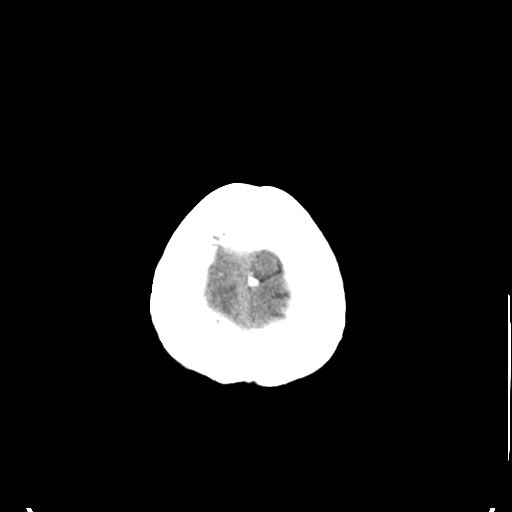

[Series 3: head bone · axial · 0.46mm/px · z∈[-106,-70]mm · 3 of 90 slices shown]
[im 9/90  bone]
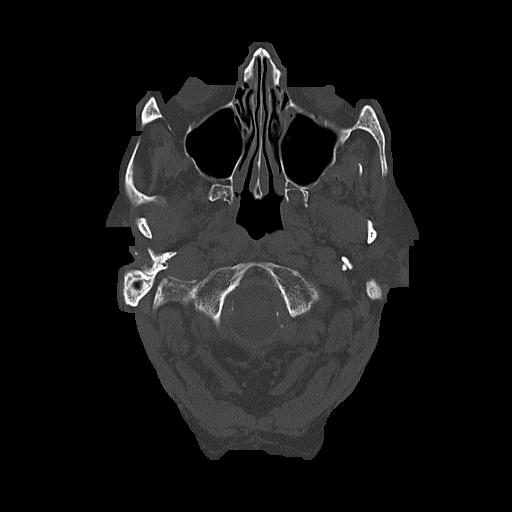
[im 18/90  bone]
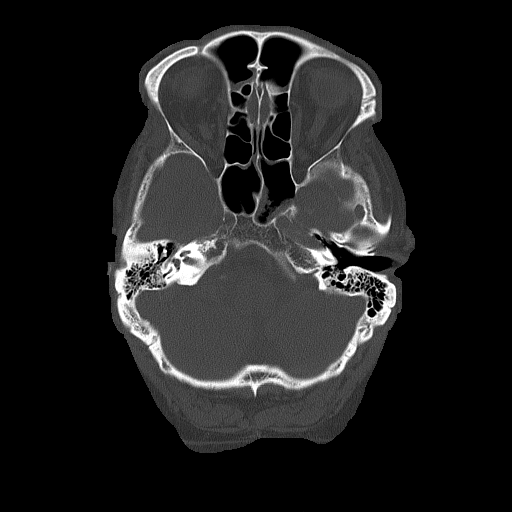
[im 27/90  bone]
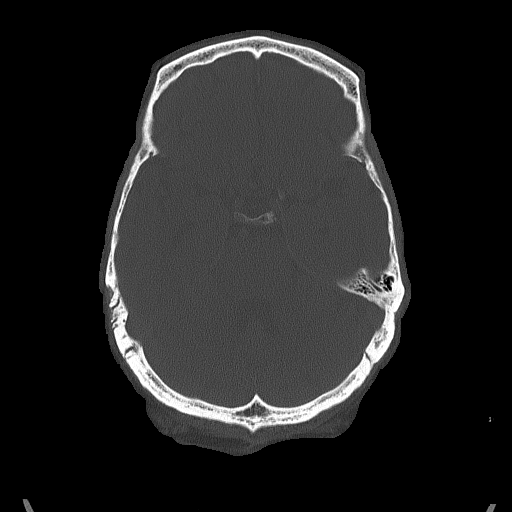

[Series 4: coronal soft tissue · coronal · 0.35mm/px · 3 of 75 slices shown]
[im 25/75  brain]
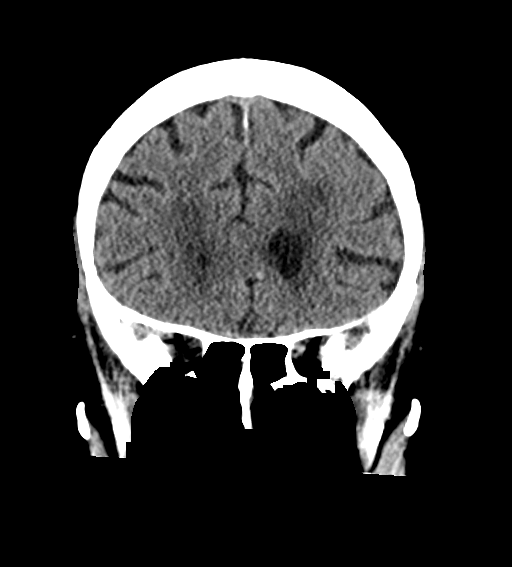
[im 33/75  brain]
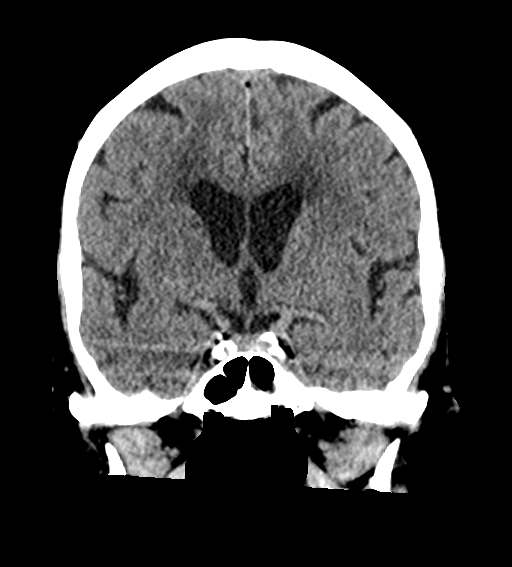
[im 42/75  brain]
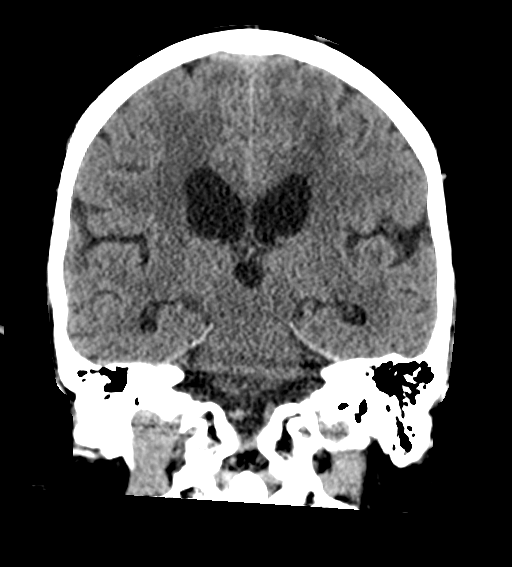

[Series 5: sagittal soft tissue · sagittal · 0.38mm/px · 3 of 57 slices shown]
[im 19/57  brain]
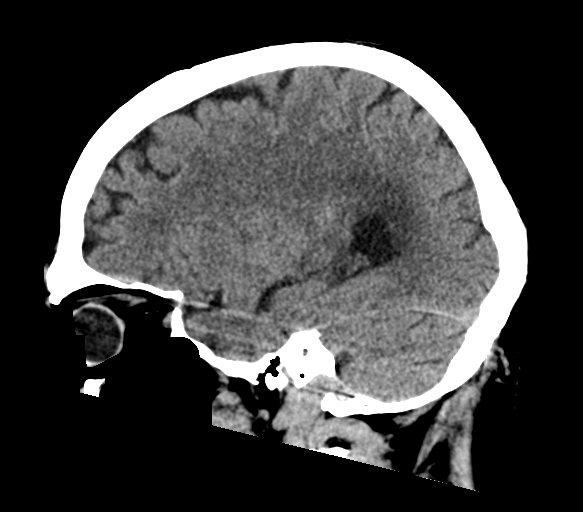
[im 29/57  brain]
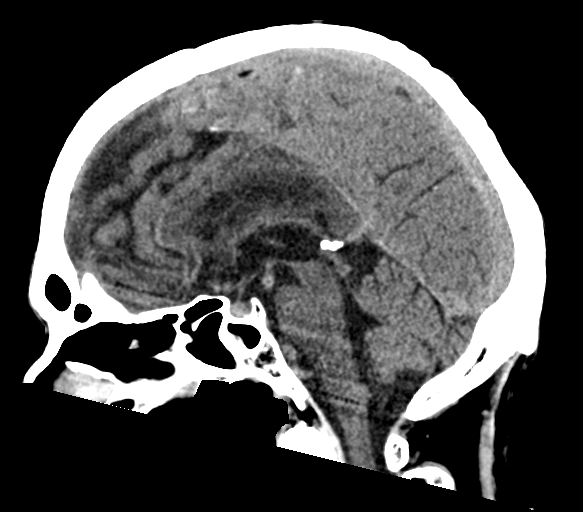
[im 38/57  brain]
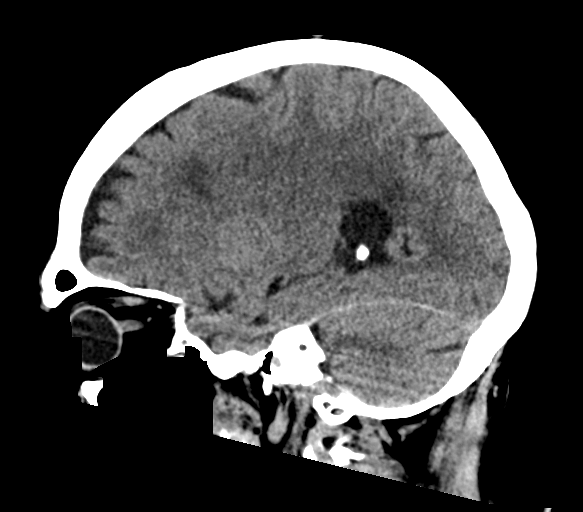

[16 of 47 positions shown; findings below may reference images not displayed]

FINDINGS: Brain: No change or acute finding by CT. No abnormality seen
affecting the brainstem or cerebellum. There is old infarction in
the right occipital lobe. There are extensive chronic small-vessel
ischemic changes of the hemispheric white matter. No mass,
hemorrhage, hydrocephalus or extra-axial collection.

Vascular: There is atherosclerotic calcification of the major
vessels at the base of the brain.

Skull: Negative

Sinuses/Orbits: Clear/normal

Other: None
IMPRESSION: No change or acute CT finding. Old right occipital infarction.
Extensive chronic small-vessel ischemic changes of the hemispheric
white matter.

## 2021-06-29 MED ORDER — HEPARIN (PORCINE) 25000 UT/250ML-% IV SOLN
1250.0000 [IU]/h | INTRAVENOUS | Status: DC
Start: 2021-06-29 — End: 2021-06-30
  Administered 2021-06-29: 1100 [IU]/h via INTRAVENOUS
  Administered 2021-06-30: 07:00:00 1250 [IU]/h via INTRAVENOUS
  Filled 2021-06-29 (×2): qty 250

## 2021-06-29 MED ORDER — HEPARIN BOLUS VIA INFUSION
1300.0000 [IU] | Freq: Once | INTRAVENOUS | Status: AC
  Administered 2021-06-29: 20:00:00 1300 [IU] via INTRAVENOUS
  Filled 2021-06-29: qty 1300

## 2021-06-29 NOTE — Progress Notes (Signed)
OT Cancellation Note  Patient Details Name: WISTER HOEFLE MRN: [id number] DOB: Jul 21, 1935   Cancelled Treatment:    Reason Eval/Treat Not Completed: Active bedrest order;Other (comment) (Pt has bedrest orders, will hold at this time. Team messaged via secure chat. Will re attempt when approrpiate for mobilization.)  Oleta Mouse, OTD OTR/L  06/29/21, 9:24 AM

## 2021-06-29 NOTE — Progress Notes (Addendum)
Neurology Progress Note Jorge Cherry MR# [id number] 06/29/2021  S: no overnight events; no new complaints.  O: Current vital signs: BP 126/77 (BP Location: Right Arm)    Pulse (!) 41    Temp 98.4 F (36.9 C) (Oral)    Resp 17    Ht 5\' 8"  (1.727 m)    Wt 82.5 kg    SpO2 97%    BMI 27.65 kg/m  Vital signs in last 24 hours: Temp:  [97.6 F (36.4 C)-98.4 F (36.9 C)] 98.4 F (36.9 C) (02/19 0346) Pulse Rate:  [41-85] 41 (02/19 0346) Resp:  [14-21] 17 (02/19 0346) BP: (126-186)/(77-112) 126/77 (02/19 0346) SpO2:  [94 %-99 %] 97 % (02/19 0346) Weight:  [82.5 kg-88.5 kg] 82.5 kg (02/18 2334)  GENERAL: Awake, sitting up in bed eating breakfast in NAD HEENT: Normocephalic and atraumatic, moist mm, no LN++, no thyromegaly LUNGS: symmetric excursions bilaterally with no audible wheezes. CV: RR, equal pulses bilaterally. ABDOMEN: Soft, nontender, nondistended with normoactive BS Ext: warm, well perfused, intact peripheral pulses  NEURO:  Mental Status: AA&Ox3  Language: speech is fluent.  Intact naming, repetition, and comprehension. Visual Fields are full. Pupils pinpoint round reactive to light. Double vision in the horizontal plane on left gaze gaze which improved with closing either eye. Right eye restricted to midline on left gaze. Facial sensation intact, hearing intact. No evidence of tongue atrophy or fibrillations, tongue/uvula/soft palate midline elevates symmetrically  Normal sternocleidomastoid and trapezius muscle strength.  Motor: 5/5 strength in all extremities. Tone: Tone and bulk is normal Sensation: Intact to light touch bilaterally Coordination: FTN intact bilaterally, no ataxia in BLE. Gait - Deferred  NIHSS 1  Labs Lab Results  Component Value Date   HGBA1C 5.5 06/28/2021   and  Lab Results  Component Value Date   LDLCALC 79 06/28/2021   Imaging  Echocardiogram read as 50 to 55% no regional wall motion abnormalities, no mention of shunt or LVT.   I  have reviewed images in epic and the results pertinent to this consultation are:  NCT head did not show acute ischemic changes, hemorrhage or mass.  CTA head showed no large vessel occlusion. Bulky calcified plaque about left carotid bulb/proximal left ICA stenosis up to ~50-60% by NASCET criteria. Atheromatous change about right carotid bulb/proximal right ICA stenosis up to ~40% by NASCET criteria. Irregular fusiform aneurysm arising from the distal left PICA as above, measuring 77mm in maximal diameter.  Late Addendum - Follow up NCT head with likely stroke:    Assessment: Jorge Cherry is a 86 y.o. male PMHx atrial fibrillation on apixaban with acute cardio embolic stroke and based on exam likely the right paramedian midbrain due to missed dose of apixaban.   Impression Acute cardio embolic stroke Diplopia on left gaze Atrial fibrillation On apixaban - Missed dose NIHSS 1 Fusiform aneurysm distal left PICA 55mm - 5-year rupture risk 1.7% Pacemaker   Recommendations: - Follow up CT Head - Pending. Please call Neurology if scan shows significant abnormality other than expected stroke. - Continue anticoagulation. - Statin for goal LDL <70. - BP goal <130/90. - Repeat CTA head in 6 months for aneurysm surveillance.  - Follow up with outpatient neurology. - Neurology will remain available, please call for questions.   Electronically signed by:  Lynnae Sandhoff, MD Page: [phone] 06/29/2021, 8:05 AM  If 7pm- 7am, please page neurology on call as listed in [place].

## 2021-06-29 NOTE — Progress Notes (Signed)
ANTICOAGULATION CONSULT NOTE - Initial Consult  Pharmacy Consult for Heparin (transitioning from Eliquis) Indication: atrial fibrillation/possible surgery on 2/20 or 2/21   Allergies  Allergen Reactions   Altace [Ramipril] Swelling and Other (See Comments)    Mouth swelling   Mucinex [Guaifenesin Er] Hives, Swelling and Other (See Comments)    Mouth swelling   Azithromycin     Other reaction(s): Does not work   Contrast Media [Iodinated Contrast Media] Other (See Comments)    Made eyes change each time    Patient Measurements: Height: 5\' 8"  (172.7 cm) Weight: 82.5 kg (181 lb 14.1 oz) IBW/kg (Calculated) : 68.4 Heparin Dosing Weight:  86.4 kg   Vital Signs: Temp: 98.4 F (36.9 C) (02/19 1649) Temp Source: Oral (02/19 1649) BP: 100/74 (02/19 1649) Pulse Rate: 70 (02/19 1649)  Labs: Recent Labs    06/28/21 1723 06/28/21 1817 06/28/21 2349 06/29/21 0106 06/29/21 0145 06/29/21 0406 06/29/21 1746  HGB 12.5*  --   --   --   --  13.1  --   HCT 38.8*  --   --   --   --  40.3  --   PLT 160  --   --   --   --  161  --   APTT  --  33  --   --   --   --  58*  LABPROT  --  14.2  --   --   --   --   --   INR  --  1.1  --   --   --   --   --   HEPARINUNFRC  --   --   --   --  >1.10*  --   --   CREATININE 1.43*  --   --   --   --   --   --   TROPONINIHS  --   --  27* 26*  --   --   --     Estimated Creatinine Clearance: 41 mL/min (A) (by C-G formula based on SCr of 1.43 mg/dL (H)).   Medical History: Past Medical History:  Diagnosis Date   Arthritis    "just a touch in my hands" (11/24/2016)   Asthma    Atherosclerosis of renal artery ([place])    RENAL DOPPLER, 12/10/2011 - Left renal artery demonstrated narrowing with elevated velocities consistent with a 1-59% diameter reduction   CKD (chronic kidney disease) stage 3, GFR 30-59 ml/min (HCC)    "stable now since they backed off the water pills" (11/24/2016)   Coronary artery disease    a. 1994 s/p CABG x 4 (LIMA-LAD, VG->D2,  VG->OM, VG->RCA); b. 02/2004 PCI SVG-D2 (3.5x16 Taxus DES). VG->RCA 100. Sev apical LAD dzs distal to LIMA insertion; c. 11/2016 Cath: LAD 95p/155m, D2 100ost, LCX 100ost, 70p/m, RCA 100p/m, RPDA fills via L->R collats. VG->RCA 100, VG->D2 20 ost, patent prox stent, 21m, LIMA->LAD ok, VG->OM3 20p. EF 55%-->Med Rx.   GERD (gastroesophageal reflux disease)    High cholesterol    History of lower GI bleeding    a. 09/2014 GIB due to diverticulosis/diverticulitis.   Iron deficiency anemia    Labile Hypertension    Moderate aortic stenosis    a. 10/2011 Echo:  EF >55%, mild-mod TR, mild-mod AS, mod Ca2+ of AoV leaflets; b. 07/2016 Echo: EF 60-65%, no rwma, Gr1 DD, mod AS [(S) mean grad 61mmHg, peak grad 68mmHg. Valve area (VTI): 1.33cm^2, (Vmax) 1.44cm^2. Mild MR]; c. 02/2018 Echo: EF  55-60%, no rwma, GR1 DD, Mod AS [Peak Vel (S): 319cm/s, Mean grad (S) 45mmHg, peak grad (S) 33mmHg].   PAF (paroxysmal atrial fibrillation) (HCC)    a. CHA2DS2VASc = 4-->Eliquis.   S/P TAVR (transcatheter aortic valve replacement) 11/14/2019   s/p TAVR with a 26 mm Edwards S3U via the TF approach by Drs Angelena Form & Bartle   Sleep apnea     Assessment: Pharmacy consulted to dose heparin in this 86 year old male with Afib.  Pt was on Eliquis 5 mg PO BID ,  last dose on 2/18 @ 2215.   Pt may go for surgery on 2/20 or 2/21 so MD wants to transition to Heparin.   Goal of Therapy:  Heparin level 0.3-0.7 units/ml aPTT 66 - 102 seconds Monitor platelets by anticoagulation protocol: Yes   Plan:  aPTT is subtherapeutic. Will order 1300 units bolus and increase heparin infusion to 1250 units/h Re-check aPTT 8 hrs after rate change Will use aPTT to guide dosing until HL and aPTT are correlating. Check CBC daily.   Jorge Cherry O Jorge Cherry 06/29/2021,6:30 PM

## 2021-06-29 NOTE — Evaluation (Signed)
Occupational Therapy Evaluation Patient Details Name: Jorge Cherry MRN: [id number] DOB: June 07, 1935 Today's Date: 06/29/2021   History of Present Illness Pt is an 86 y.o. male presenting to hospital with acute onset of diplopia; R eye medial gaze palsy noted.  Pt admitted with diplopia, gait disturbance, slurred speech, dizziness, chest pain, CAD, CHF, a-fib, and htn.  Initial head CT negative but suspected CVA.  PMH includes CAD s/p CABG, a-fib on Eliquis, htn, HLD, CKD, aortic stenosis s/p TAVR, and pacemaker.   Clinical Impression   Chart reviewed, pt greeted in bed agreeable to OT tx session with wife present. Pt is alert and oriented x4. Pt lives with his wife who reports she will assist with all ADL. FMC/dexterity appears WFL, strength appears WFL. All ADLs are impacted by current visual status.Pt is unsteady during ambulation and reports dizziness, however vss throughout. Family educated on compensatory techniques and safety while completing ADLs. ue to functional deficits recommend discharge home with [facility] however pt and family also counseled on recommendation for eventual referral to outpatient OT/vision therapy to address. All are in agreement. Pt is left in bedside chair, NAD, all needs met. OT will continue to follow.      Recommendations for follow up therapy are one component of a multi-disciplinary discharge planning process, led by the attending physician.  Recommendations may be updated based on patient status, additional functional criteria and insurance authorization.   Follow Up Recommendations  Home health OT    Assistance Recommended at Discharge Frequent or constant Supervision/Assistance  Patient can return home with the following A little help with walking and/or transfers;Assistance with cooking/housework;Direct supervision/assist for medications management;Assist for transportation;Help with stairs or ramp for entrance;A little help with bathing/dressing/bathroom     Functional Status Assessment  Patient has had a recent decline in their functional status and demonstrates the ability to make significant improvements in function in a reasonable and predictable amount of time.  Equipment Recommendations  Tub/shower seat    Recommendations for Other Services       Precautions / Restrictions Precautions Precautions: Fall Restrictions Weight Bearing Restrictions: No      Mobility Bed Mobility Overal bed mobility: Needs Assistance Bed Mobility: Supine to Sit     Supine to sit: Min guard          Transfers Overall transfer level: Needs assistance Equipment used: Rolling walker (2 wheels) Transfers: Sit to/from Stand, Bed to chair/wheelchair/BSC Sit to Stand: Min guard     Step pivot transfers: Min guard            Balance Overall balance assessment: Needs assistance Sitting-balance support: No upper extremity supported, Feet supported Sitting balance-Leahy Scale: Good     Standing balance support: Bilateral upper extremity supported, During functional activity Standing balance-Leahy Scale: Poor                             ADL either performed or assessed with clinical judgement   ADL Overall ADL's : Needs assistance/impaired                                       General ADL Comments: grooming at seated level with MIN A, LB dressing with CGA seated, anticipated UB dressing with SET UP; all ADL affected by visual deficits     Vision Patient Visual Report: Diplopia Vision Assessment?: Yes Ocular Range  of Motion: Restricted on the right Tracking/Visual Pursuits: Right eye does not track medially Saccades: Decreased speed of saccadic movement Convergence: Impaired - to be further tested in functional context Diplopia Assessment: Disappears with one eye closed;Present all the time/all directions Additional Comments: pt with reported diplopia with both eyes open affecting all ADL/functional  mobility     Perception     Praxis      Pertinent Vitals/Pain Pain Assessment Pain Assessment: No/denies pain     Hand Dominance     Extremity/Trunk Assessment Upper Extremity Assessment Upper Extremity Assessment: RUE deficits/detail;LUE deficits/detail RUE Deficits / Details: B FMC/dexterity appear intact however package/container managment affected by visual deficits; functional strength appears 4/5 throughout   Lower Extremity Assessment Lower Extremity Assessment:  (intact B LE heel to shin coordination, proprioception, tone, light touch, and strength (at least 4/5 hip flexion, knee flexin/extension, and DF/PF AROM))   Cervical / Trunk Assessment Cervical / Trunk Assessment: Other exceptions Cervical / Trunk Exceptions: forward head/shoulders   Communication Communication Communication: HOH   Cognition Arousal/Alertness: Awake/alert Behavior During Therapy: WFL for tasks assessed/performed Overall Cognitive Status: Within Functional Limits for tasks assessed                                       General Comments  pt attempted to ambulate to bathroom approx 10 feet, reported dizziness and returned to bedside chair. Vital signs stable prior to and after.    Exercises     Shoulder Instructions      Home Living Family/patient expects to be discharged to:: Private residence Living Arrangements: Spouse/significant other;Other relatives (sister in law) Available Help at Discharge: Family;Available 24 hours/day Type of Home: House Home Access: Stairs to enter [employer] of Steps: 3-4 Entrance Stairs-Rails: Right;Left Home Layout: One level     Bathroom Shower/Tub: Teacher, early years/pre: Standard Bathroom Accessibility: Yes How Accessible: Accessible via walker Home Equipment: Cane - single point;Shower seat;Rolling Environmental consultant (2 wheels)      Lives With: Spouse;Family;Other (Comment) (wife's sister; pt's daughter)    Prior  Functioning/Environment Prior Level of Function : Independent/Modified Independent             Mobility Comments: Ind with no AD ADLs Comments: indep in all ADL/IADL; retired, drives        OT Problem List: Decreased strength;Decreased activity tolerance;Impaired vision/perception;Impaired balance (sitting and/or standing)      OT Treatment/Interventions: Self-care/ADL training;Energy conservation;Modalities;Balance training;Therapeutic exercise;DME and/or AE instruction;Patient/family education;Therapeutic activities;Manual therapy;Neuromuscular education;Visual/perceptual remediation/compensation    OT Goals(Current goals can be found in the care plan section) Acute Rehab OT Goals Patient Stated Goal: to go home OT Goal Formulation: With patient Time For Goal Achievement: 07/13/21 Potential to Achieve Goals: Good ADL Goals Pt Will Perform Lower Body Dressing: with modified independence Pt Will Transfer to Toilet: with modified independence Pt Will Perform Toileting - Clothing Manipulation and hygiene: with modified independence Additional ADL Goal #1: Pt will report 1 visual compensatory technique to address visual deficit  OT Frequency: Min 3X/week    Co-evaluation              AM-PAC OT "6 Clicks" Daily Activity     Outcome Measure Help from another person eating meals?: None Help from another person taking care of personal grooming?: None Help from another person toileting, which includes using toliet, bedpan, or urinal?: A Little Help from another person bathing (including  washing, rinsing, drying)?: A Little Help from another person to put on and taking off regular upper body clothing?: None Help from another person to put on and taking off regular lower body clothing?: None 6 Click Score: 22   End of Session Equipment Utilized During Treatment: Gait belt;Rolling walker (2 wheels) Nurse Communication: Mobility status  Activity Tolerance: Patient tolerated  treatment well Patient left: in chair;with call bell/phone within reach;with chair alarm set;with family/visitor present  OT Visit Diagnosis: Unsteadiness on feet (R26.81);Muscle weakness (generalized) (M62.81);Other symptoms and signs involving the nervous system (R29.898)                Time: 6948-5462 OT Time Calculation (min): 24 min Charges:  OT General Charges $OT Visit: 1 Visit OT Evaluation $OT Eval Moderate Complexity: 1 Mod OT Treatments $Self Care/Home Management : 8-22 mins  Shanon Payor, OTD OTR/L  06/29/21, 4:31 PM

## 2021-06-29 NOTE — Progress Notes (Signed)
Chaplain responded to an order requisition for AD; provided materials and education to pt and family.  Wife was a Art therapist and is familiar with the process. Pt will discuss with family and will call our department if he wishes to move forward with the process.    Please contact if follow-up is requested.  Minus Liberty, [place] Pager:  [phone]    06/29/21 2000  Clinical Encounter Type  Visited With Patient and family together  Visit Type Initial;Other (Comment) (AD)  Consult/Referral To Chaplain  Stress Factors  Patient Stress Factors Health changes

## 2021-06-29 NOTE — Progress Notes (Signed)
*  PRELIMINARY RESULTS* Echocardiogram 2D Echocardiogram has been performed.  Neita Garnet Kyre Jeffries 06/29/2021, 12:08 PM

## 2021-06-29 NOTE — Evaluation (Signed)
Physical Therapy Evaluation Patient Details Name: Jorge Cherry MRN: [id number] DOB: 11/13/1935 Today's Date: 06/29/2021  History of Present Illness  Pt is an 86 y.o. male presenting to hospital with acute onset of diplopia; R eye medial gaze palsy noted.  Pt admitted with diplopia, gait disturbance, slurred speech, dizziness, chest pain, CAD, CHF, a-fib, and htn.  Initial head CT negative but suspected CVA.  PMH includes CAD s/p CABG, a-fib on Eliquis, htn, HLD, CKD, aortic stenosis s/p TAVR, and pacemaker.  Clinical Impression  Prior to hospital admission, pt was independent with ambulation; lives with his wife and additional family in 1 level home with 3-4 STE B railings.  Pt reporting all symptoms resolved now except double vision.  Intact B LE strength, sensation, heel to shin coordination, tone, and proprioception.  Currently pt is CGA with transfers and CGA to min assist to ambulate 30 feet with RW.  Mild L lean noted during ambulation requiring CGA to min assist for safety (pt keeping R eye closed in order to improve double vision symptoms; attempted to cue pt to close L eye but pt reporting symptoms were better with R eye closed).  Pt would benefit from skilled PT to address noted impairments and functional limitations (see below for any additional details).  Upon hospital discharge, pt would benefit from [place] and 24/7 assist with functional mobility (pt's wife reports pt's son lives next door and is able to be there most of the time to assist; pt and pt's wife educated pt needing 24/7 assist with functional mobility for safety--both verbalizing understanding).    Recommendations for follow up therapy are one component of a multi-disciplinary discharge planning process, led by the attending physician.  Recommendations may be updated based on patient status, additional functional criteria and insurance authorization.  Follow Up Recommendations Home health PT    Assistance Recommended at  Discharge Frequent or constant Supervision/Assistance  Patient can return home with the following  A little help with walking and/or transfers;A little help with bathing/dressing/bathroom;Assistance with cooking/housework;Assist for transportation;Help with stairs or ramp for entrance    Equipment Recommendations Rolling walker (2 wheels);BSC/3in1  Recommendations for Other Services  OT consult    Functional Status Assessment Patient has had a recent decline in their functional status and demonstrates the ability to make significant improvements in function in a reasonable and predictable amount of time.     Precautions / Restrictions Precautions Precautions: Fall Restrictions Weight Bearing Restrictions: No      Mobility  Bed Mobility               General bed mobility comments: Deferred (pt in recliner beginning/end of session)    Transfers Overall transfer level: Needs assistance Equipment used: Rolling walker (2 wheels) Transfers: Sit to/from Stand Sit to Stand: Min guard           General transfer comment: strong stand noted from recliner; use of RW    Ambulation/Gait Ambulation/Gait assistance: Min guard, Min assist Gait Distance (Feet): 30 Feet Assistive device: Rolling walker (2 wheels)   Gait velocity: decreased     General Gait Details: mild L lean noted during ambulation requiring CGA to min assist for balance; partial step through gait pattern  Stairs            Wheelchair Mobility    Modified Rankin (Stroke Patients Only)       Balance Overall balance assessment: Needs assistance Sitting-balance support: No upper extremity supported, Feet supported Sitting balance-Leahy Scale: Good Sitting  balance - Comments: steady sitting reaching within BOS   Standing balance support: Bilateral upper extremity supported, During functional activity Standing balance-Leahy Scale: Poor Standing balance comment: CGA to min assist for balance with  ambulation d/t mild L lean (use of RW)                             Pertinent Vitals/Pain Pain Assessment Pain Assessment: No/denies pain Vitals (HR and O2 on room air) stable and WFL throughout treatment session.    Home Living Family/patient expects to be discharged to:: Private residence Living Arrangements: Spouse/significant other;Other relatives (pt's daughter; pt's wife's sister (with Parkinson's type diagnosis and pt/pt's wife assist with her care)) Available Help at Discharge: Family;Available 24 hours/day Type of Home: House Home Access: Stairs to enter Entrance Stairs-Rails: Psychiatric nurse of Steps: 3-4   Home Layout: One level Home Equipment: Cane - single point;Shower seat;Rolling Environmental consultant (2 wheels)      Prior Function Prior Level of Function : Independent/Modified Independent             Mobility Comments: Independent without any AD/DME use; h/o some "stumbles" but no falls in past 6 months ADLs Comments: Per OT eval "indep in all ADL/IADL; retired, drives"     Journalist, newspaper        Extremity/Trunk Assessment   Upper Extremity Assessment Upper Extremity Assessment: Defer to OT evaluation    Lower Extremity Assessment Lower Extremity Assessment:  (intact B LE heel to shin coordination, proprioception, tone, light touch, and strength (at least 4/5 hip flexion, knee flexin/extension, and DF/PF AROM))    Cervical / Trunk Assessment Cervical / Trunk Assessment: Other exceptions Cervical / Trunk Exceptions: forward head/shoulders  Communication   Communication: HOH  Cognition Arousal/Alertness: Awake/alert Behavior During Therapy: WFL for tasks assessed/performed Overall Cognitive Status: Within Functional Limits for tasks assessed                                          General Comments  Nursing cleared pt for participation in physical therapy.  Pt agreeable to PT session.  Pt's wife present  beginning/end of session.    Exercises  Transfers and ambulation with RW use   Assessment/Plan    PT Assessment Patient needs continued PT services  PT Problem List Decreased balance;Decreased mobility;Decreased knowledge of use of DME;Decreased knowledge of precautions       PT Treatment Interventions DME instruction;Gait training;Stair training;Functional mobility training;Therapeutic activities;Therapeutic exercise;Balance training;Patient/family education    PT Goals (Current goals can be found in the Care Plan section)  Acute Rehab PT Goals Patient Stated Goal: to go home PT Goal Formulation: With patient/family Time For Goal Achievement: 07/13/21 Potential to Achieve Goals: Good    Frequency 7X/week     Co-evaluation               AM-PAC PT "6 Clicks" Mobility  Outcome Measure Help needed turning from your back to your side while in a flat bed without using bedrails?: None Help needed moving from lying on your back to sitting on the side of a flat bed without using bedrails?: None Help needed moving to and from a bed to a chair (including a wheelchair)?: A Little Help needed standing up from a chair using your arms (e.g., wheelchair or bedside chair)?: A Little Help needed to walk in hospital  room?: A Little Help needed climbing 3-5 steps with a railing? : A Little 6 Click Score: 20    End of Session Equipment Utilized During Treatment: Gait belt Activity Tolerance: Patient tolerated treatment well Patient left: in chair;with call bell/phone within reach;with chair alarm set;with family/visitor present Nurse Communication: Mobility status;Precautions;Other (comment) (L lean with ambulation) PT Visit Diagnosis: Other abnormalities of gait and mobility (R26.89)    Time: [id number] PT Time Calculation (min) (ACUTE ONLY): 27 min   Charges:   PT Evaluation $PT Eval Low Complexity: 1 Low PT Treatments $Gait Training: 8-22 mins       Leitha Bleak,  PT 06/29/21, 3:49 PM

## 2021-06-29 NOTE — Progress Notes (Signed)
ANTICOAGULATION CONSULT NOTE - Initial Consult  Pharmacy Consult for Heparin (transitioning from Eliquis) Indication: atrial fibrillation/possible surgery on 2/20 or 2/21   Allergies  Allergen Reactions   Altace [Ramipril] Swelling and Other (See Comments)    Mouth swelling   Mucinex [Guaifenesin Er] Hives, Swelling and Other (See Comments)    Mouth swelling   Azithromycin     Other reaction(s): Does not work   Contrast Media [Iodinated Contrast Media] Other (See Comments)    Made eyes change each time    Patient Measurements: Height: 5\' 8"  (172.7 cm) Weight: 82.5 kg (181 lb 14.1 oz) IBW/kg (Calculated) : 68.4 Heparin Dosing Weight:  86.4 kg   Vital Signs: Temp: 98.1 F (36.7 C) (02/18 2334) Temp Source: Oral (02/18 2334) BP: 173/109 (02/18 2334) Pulse Rate: 61 (02/18 2334)  Labs: Recent Labs    06/28/21 1723 06/28/21 1817 06/28/21 2349  HGB 12.5*  --   --   HCT 38.8*  --   --   PLT 160  --   --   APTT  --  33  --   LABPROT  --  14.2  --   INR  --  1.1  --   CREATININE 1.43*  --   --   TROPONINIHS  --   --  27*    Estimated Creatinine Clearance: 41 mL/min (A) (by C-G formula based on SCr of 1.43 mg/dL (H)).   Medical History: Past Medical History:  Diagnosis Date   Arthritis    "just a touch in my hands" (11/24/2016)   Asthma    Atherosclerosis of renal artery ([facility])    RENAL DOPPLER, 12/10/2011 - Left renal artery demonstrated narrowing with elevated velocities consistent with a 1-59% diameter reduction   CKD (chronic kidney disease) stage 3, GFR 30-59 ml/min (HCC)    "stable now since they backed off the water pills" (11/24/2016)   Coronary artery disease    a. 1994 s/p CABG x 4 (LIMA-LAD, VG->D2, VG->OM, VG->RCA); b. 02/2004 PCI SVG-D2 (3.5x16 Taxus DES). VG->RCA 100. Sev apical LAD dzs distal to LIMA insertion; c. 11/2016 Cath: LAD 95p/155m, D2 100ost, LCX 100ost, 70p/m, RCA 100p/m, RPDA fills via L->R collats. VG->RCA 100, VG->D2 20 ost, patent prox stent,  73m, LIMA->LAD ok, VG->OM3 20p. EF 55%-->Med Rx.   GERD (gastroesophageal reflux disease)    High cholesterol    History of lower GI bleeding    a. 09/2014 GIB due to diverticulosis/diverticulitis.   Iron deficiency anemia    Labile Hypertension    Moderate aortic stenosis    a. 10/2011 Echo:  EF >55%, mild-mod TR, mild-mod AS, mod Ca2+ of AoV leaflets; b. 07/2016 Echo: EF 60-65%, no rwma, Gr1 DD, mod AS [(S) mean grad 37mmHg, peak grad 7mmHg. Valve area (VTI): 1.33cm^2, (Vmax) 1.44cm^2. Mild MR]; c. 02/2018 Echo: EF 55-60%, no rwma, GR1 DD, Mod AS [Peak Vel (S): 319cm/s, Mean grad (S) 68mmHg, peak grad (S) 21mmHg].   PAF (paroxysmal atrial fibrillation) (HCC)    a. CHA2DS2VASc = 4-->Eliquis.   S/P TAVR (transcatheter aortic valve replacement) 11/14/2019   s/p TAVR with a 26 mm Edwards S3U via the TF approach by Drs Angelena Form & Bartle   Sleep apnea     Medications:   Assessment: Pharmacy consulted to dose heparin in this 86 year old male with Afib.  Pt was on Eliquis 5 mg PO BID ,  last dose on 2/18 @ 2215.   Pt may go for surgery on 2/20 or 2/21 so  MD wants to transition to Heparin.  CrCl = 41 ml/min   Goal of Therapy:  Heparin level 0.3-0.7 units/ml aPTT 66 - 102 seconds Monitor platelets by anticoagulation protocol: Yes   Plan:  Will order heparin gtt to start on 2/19 @ 1000 at 1100 units/hr but no bolus. Will use aPTT to guide dosing until HL and aPTT are therapeutic. Will check aPTT 8 hrs after start of drip on 2/19 @ 1800. Will check CBC daily.   Jennette Leask D 06/29/2021,1:33 AM

## 2021-06-29 NOTE — Progress Notes (Signed)
PROGRESS NOTE  Jorge Cherry    DOB: [birth year], 86 y.o.  [id number]    Code Status: Full Code   DOA: 06/28/2021   LOS: 0   Brief hospital course  Jorge Cherry is a 86 y.o. male with a PMH significant for CAD, CKD, Afib. They presented from home to the ED on 06/28/2021 with acute onset visual changes. He states he got dizzy and had double vision.  In the ED, it was found that they had EOM abnormalities. Initial head imaging ws negative. Neurology was consulted for further evaluation for suspected stroke.  They were treated with home apixaban, atorvastatin. Vital signs remained stable.  Patient was admitted to medicine service for further workup and management of suspected stroke as outlined in detail below.  06/29/21 -stable, presenting symptoms remain  Assessment & Plan  Principal Problem:   Double vision Active Problems:   Coronary atherosclerosis of native coronary artery   Chronic renal insufficiency, stage III (moderate) (HCC)   Chest pain   Essential hypertension   Paroxysmal atrial fibrillation (HCC)   Chronic diastolic CHF (congestive heart failure) (HCC)   Anemia   Drug-induced hypothyroidism   Slurred speech   Gait disturbance   Dizziness  Diplopia- etiology likely CVA. Initial head CT negative. CTA negative for large vessel occlusion. MRI not compatible with pacemaker. Will repeat head CT per neurology recommendations. Symptoms still present. - neurology following, appreciate recs - PT/OT/SLP  Stable angina   CAD s/p CABG   Afib   HFpEF   HTN   HLD   severe aortic stenosis s/p TAVR   complete heart block s/p pacemaker- denies cheset pain currently - continue home medications  - ranolazine, imdur, atorvastatin, metoprolol - echo pending as ordered in stroke evaluation  CKDII- Cr 1.43. appears to be close to baseline - BMP am to monitor with use of contrast   Hypothyroidism  - continue home levothyroxine - TSH pending  H/o diverticulosis - no current bleed -  CBC am  BPH- without obstructive symptoms - continue tamsulosin  Body mass index is 27.65 kg/m.  VTE ppx: SCD's Start: 06/28/21 2213   Diet:     Diet   Diet regular Room service appropriate? Yes; Fluid consistency: Thin   Subjective 06/29/21    Pt reports still having double vision this morning. Has not been up yet to see if he is dizzy or having balance issues. Denies any other complaints   Objective   Vitals:   06/28/21 2334 06/29/21 0225 06/29/21 0346 06/29/21 0902  BP: (!) 173/109 (!) 157/82 126/77 128/71  Pulse: 61 68 (!) 41 65  Resp: 18 18 17 18   Temp: 98.1 F (36.7 C)  98.4 F (36.9 C) 97.8 F (36.6 C)  TempSrc: Oral  Oral Oral  SpO2: 97% 98% 97% 92%  Weight: 82.5 kg     Height:        Intake/Output Summary (Last 24 hours) at 06/29/2021 1030 Last data filed at 06/29/2021 0900 Gross per 24 hour  Intake --  Output 350 ml  Net -350 ml   Filed Weights   06/28/21 1635 06/28/21 2334  Weight: 88.5 kg 82.5 kg     Physical Exam:  General: awake, alert, NAD HEENT: atraumatic, clear conjunctiva, anicteric sclera, MMM,hard of hearing Respiratory: normal respiratory effort. Cardiovascular: normal S1/S2, RRR, no JVD, murmurs, quick capillary refill  Gastrointestinal: soft, NT, ND Nervous: A&O x3. no gross focal neurologic deficits, normal speech Extremities: moves all equally, mild edema, normal  tone Skin: dry, intact, normal temperature, normal color. No rashes, lesions or ulcers on exposed skin Psychiatry: normal mood, congruent affect  Labs   I have personally reviewed the following labs and imaging studies CBC    Component Value Date/Time   WBC 7.8 06/29/2021 0406   RBC 4.42 06/29/2021 0406   HGB 13.1 06/29/2021 0406   HGB 13.1 11/07/2020 0000   HCT 40.3 06/29/2021 0406   HCT 40.2 11/07/2020 0000   PLT 161 06/29/2021 0406   PLT 158 11/07/2020 0000   MCV 91.2 06/29/2021 0406   MCV 92 11/07/2020 0000   MCH 29.6 06/29/2021 0406   MCHC 32.5  06/29/2021 0406   RDW 14.1 06/29/2021 0406   RDW 13.1 11/07/2020 0000   LYMPHSABS 0.9 06/28/2021 1723   MONOABS 0.9 06/28/2021 1723   EOSABS 0.2 06/28/2021 1723   BASOSABS 0.0 06/28/2021 1723   BMP Latest Ref Rng & Units 06/28/2021 04/23/2021 11/07/2020  Glucose 70 - 99 mg/dL 116(H) 95 81  BUN 8 - 23 mg/dL 26(H) 27 21  Creatinine 0.61 - 1.24 mg/dL 1.43(H) 1.71(H) 1.53(H)  BUN/Creat Ratio 10 - 24 - 16 14  Sodium 135 - 145 mmol/L 141 144 143  Potassium 3.5 - 5.1 mmol/L 3.3(L) 4.3 4.2  Chloride 98 - 111 mmol/L 108 108(H) 107(H)  CO2 22 - 32 mmol/L 26 20 21   Calcium 8.9 - 10.3 mg/dL 9.1 9.2 9.3    CT ANGIO HEAD NECK W WO CM  Result Date: 06/29/2021 CLINICAL DATA:  Initial evaluation for neuro deficit, stroke suspected. EXAM: CT ANGIOGRAPHY HEAD AND NECK TECHNIQUE: Multidetector CT imaging of the head and neck was performed using the standard protocol during bolus administration of intravenous contrast. Multiplanar CT image reconstructions and MIPs were obtained to evaluate the vascular anatomy. Carotid stenosis measurements (when applicable) are obtained utilizing NASCET criteria, using the distal internal carotid diameter as the denominator. RADIATION DOSE REDUCTION: This exam was performed according to the departmental dose-optimization program which includes automated exposure control, adjustment of the mA and/or kV according to patient size and/or use of iterative reconstruction technique. CONTRAST:  56mL OMNIPAQUE IOHEXOL 350 MG/ML SOLN COMPARISON:  Head CT from earlier the same day. FINDINGS: CTA NECK FINDINGS Aortic arch: Visualized aortic arch normal in caliber with normal branch pattern. Moderate atheromatous change seen about the arch itself. No high-grade stenosis about the origin the great vessels. Right carotid system: Right CCA patent without stenosis. Scattered calcified plaque about the right carotid bulb/proximal right ICA with associated stenosis of up to 40% by NASCET criteria.  Right ICA mildly tortuous but widely patent distally without stenosis or dissection. Left carotid system: Left CCA patent from its origin to the bifurcation without stenosis. Eccentric bulky calcified plaque about the left carotid bulb/proximal left ICA with associated stenosis of up to 50-60% stenosis by NASCET criteria. Left ICA tortuous but widely patent distally without stenosis or dissection. Vertebral arteries: Both vertebral arteries arise from the subclavian arteries. Vertebral arteries mildly tortuous but are widely patent without stenosis or dissection. Skeleton: No discrete or worrisome osseous lesions. Advanced spondylosis present at C5-6. Patient is edentulous. Other neck: No other acute soft tissue abnormality within the neck. Upper chest: Visualized upper chest demonstrates no acute finding. Left-sided pacemaker/AICD noted. Review of the MIP images confirms the above findings CTA HEAD FINDINGS Anterior circulation: Petrous segments patent bilaterally. Atheromatous change within the carotid siphons with no more than mild multifocal narrowing. A1 segments patent bilaterally. Right A1 hypoplastic. Normal anterior communicating artery  complex. Anterior cerebral arteries patent without stenosis. No M1 stenosis or occlusion. Normal MCA bifurcations. Distal MCA branches perfused and symmetric. Posterior circulation: Atheromatous change within the proximal V4 segments bilaterally with associated mild stenosis on the right. Both PICA patent at their origins. There is a fusiform aneurysm measuring up to 3 mm in maximal transverse diameter involving the distal left PICA (series 9, image 250). Additional smaller focal aneurysm noted slightly distally as well (series 10, image 148). Basilar patent to its distal aspect without stenosis. Superior cerebellar arteries patent bilaterally. Both PCAs primarily supplied via the basilar well perfused or distal aspects. Venous sinuses: Patent allowing for timing the  contrast bolus. Anatomic variants: None significant. Review of the MIP images confirms the above findings IMPRESSION: 1. Negative CTA for large vessel occlusion. 2. Eccentric bulky calcified plaque about the left carotid bulb/proximal left ICA with associated stenosis of up to 50-60% by NASCET criteria. 3. Atheromatous change about the right carotid bulb/proximal right ICA with associated stenosis of up to 40% by NASCET criteria. 4. Irregular fusiform aneurysm arising from the distal left PICA as above, measuring 3 mm in maximal diameter. 5. Diffuse tortuosity of the major arterial vasculature of the head and neck, suggesting chronic underlying hypertension. Electronically Signed   By: Jeannine Boga M.D.   On: 06/29/2021 00:03   CT HEAD CODE STROKE WO CONTRAST  Result Date: 06/28/2021 CLINICAL DATA:  Code stroke.  Neuro deficit, acute, stroke suspected EXAM: CT HEAD WITHOUT CONTRAST TECHNIQUE: Contiguous axial images were obtained from the base of the skull through the vertex without intravenous contrast. RADIATION DOSE REDUCTION: This exam was performed according to the departmental dose-optimization program which includes automated exposure control, adjustment of the mA and/or kV according to patient size and/or use of iterative reconstruction technique. COMPARISON:  CT head 11/18/2020. FINDINGS: Brain: Similar appearance of remote infarct in the right occipital lobe. No evidence of acute large vascular territory infarct. Moderate to advanced patchy white matter hypoattenuation, nonspecific but compatible with chronic microvascular ischemic disease. Cerebral atrophy with ex vacuo ventricular dilation. No hydrocephalus. No acute hemorrhage, mass lesion, or midline shift. Vascular: No hyperdense vessel identified. Skull: No evidence of acute fracture. Sinuses/Orbits: Mild paranasal sinus mucosal thickening. Unremarkable orbits. Other: No mastoid effusions. ASPECTS [facility] Stroke Program Early CT Score)  Total score (0-10 with 10 being normal): 10. IMPRESSION: 1. No evidence of acute intracranial abnormality. ASPECTS is 10. 2. Remote right occipital infarct and moderate to advanced chronic microvascular ischemic disease. 3. Cerebral Atrophy (ICD10-G31.9). Code stroke imaging results were communicated on 06/28/2021 at 6:05 pm to provider [facility] via secure text paging. Electronically Signed   By: Margaretha Sheffield M.D.   On: 06/28/2021 18:07    Disposition Plan & Communication  Patient status: Observation  Admitted From: Home Planned disposition location: Home Anticipated discharge date: 2/20 pending completed neurology evaluation  Family Communication: wife at bedside    Author: Richarda Osmond, DO [facility] 06/29/2021, 10:30 AM   Available by Epic secure chat 7AM-7PM. If 7PM-7AM, please contact night-coverage.  [facility] contact information found on [email or web address].

## 2021-06-29 NOTE — Evaluation (Addendum)
Speech Language Pathology Evaluation Patient Details Name: Jorge Cherry MRN: [id number] DOB: 04/17/1936 Today's Date: 06/29/2021 Time: 1000-1020 SLP Time Calculation (min) (ACUTE ONLY): 20 min  Problem List:  Patient Active Problem List   Diagnosis Date Noted   Gaze palsy    Double vision 06/28/2021   Slurred speech 06/28/2021   Gait disturbance 06/28/2021   Dizziness 06/28/2021   Pacemaker 03/04/2020   Typical atrial flutter ([place])    Persistent atrial fibrillation ([place]) 11/29/2019   Secondary hypercoagulable state ([place]) 11/29/2019   Syncope and collapse 11/20/2019   Heart block AV complete ([place]) 11/20/2019   Renal lesion 11/15/2019   S/P TAVR (transcatheter aortic valve replacement) 11/14/2019   Drug-induced hypothyroidism 01/10/2019   Bradycardia 03/03/2018   Chronic anticoagulation 12/14/2017   Anemia 11/25/2016   Chronic diastolic CHF (congestive heart failure) ([place]) 05/26/2016   Junctional (nodal) bradycardia 11/20/2015   Paroxysmal atrial fibrillation ([place]) 11/20/2015   Hyperlipidemia LDL goal <70 05/03/2015   Mild persistent asthma 03/19/2015   Gastroesophageal reflux disease 03/19/2015   Essential hypertension 02/07/2015   Chest pain 01/22/2015   Pain in the chest    Chronic renal insufficiency, stage III (moderate) ([place]) 12/28/2014   Lower GI bleed 09/24/2014   Diverticulosis of colon with hemorrhage 09/24/2014   Symptomatic anemia 09/24/2014   Severe aortic stenosis 07/22/2013   Hyperlipidemia with target LDL less than 70 10/27/2012   Hearing decreased 10/27/2012   Hx of CABG 10/27/2012   Coronary atherosclerosis of native coronary artery 10/27/2012   Past Medical History:  Past Medical History:  Diagnosis Date   Arthritis    "just a touch in my hands" (11/24/2016)   Asthma    Atherosclerosis of renal artery ([place])    RENAL DOPPLER, 12/10/2011 - Left renal artery demonstrated narrowing with elevated velocities consistent with a 1-59% diameter reduction   CKD  (chronic kidney disease) stage 3, GFR 30-59 ml/min (HCC)    "stable now since they backed off the water pills" (11/24/2016)   Coronary artery disease    a. 1994 s/p CABG x 4 (LIMA-LAD, VG->D2, VG->OM, VG->RCA); b. 02/2004 PCI SVG-D2 (3.5x16 Taxus DES). VG->RCA 100. Sev apical LAD dzs distal to LIMA insertion; c. 11/2016 Cath: LAD 95p/174m, D2 100ost, LCX 100ost, 70p/m, RCA 100p/m, RPDA fills via L->R collats. VG->RCA 100, VG->D2 20 ost, patent prox stent, 30m, LIMA->LAD ok, VG->OM3 20p. EF 55%-->Med Rx.   GERD (gastroesophageal reflux disease)    High cholesterol    History of lower GI bleeding    a. 09/2014 GIB due to diverticulosis/diverticulitis.   Iron deficiency anemia    Labile Hypertension    Moderate aortic stenosis    a. 10/2011 Echo:  EF >55%, mild-mod TR, mild-mod AS, mod Ca2+ of AoV leaflets; b. 07/2016 Echo: EF 60-65%, no rwma, Gr1 DD, mod AS [(S) mean grad 72mmHg, peak grad 75mmHg. Valve area (VTI): 1.33cm^2, (Vmax) 1.44cm^2. Mild MR]; c. 02/2018 Echo: EF 55-60%, no rwma, GR1 DD, Mod AS [Peak Vel (S): 319cm/s, Mean grad (S) 61mmHg, peak grad (S) 83mmHg].   PAF (paroxysmal atrial fibrillation) (HCC)    a. CHA2DS2VASc = 4-->Eliquis.   S/P TAVR (transcatheter aortic valve replacement) 11/14/2019   s/p TAVR with a 26 mm Edwards S3U via the TF approach by Drs Angelena Form & Bartle   Sleep apnea    Past Surgical History:  Past Surgical History:  Procedure Laterality Date   CARDIAC CATHETERIZATION  02/27/2004   Coronary intervention and medical management   CARDIAC CATHETERIZATION  11/24/2016  CARDIOVERSION N/A 12/20/2019   Procedure: CARDIOVERSION;  Surgeon: Skeet Latch, MD;  Location: [facility];  Service: Cardiovascular;  Laterality: N/A;   CARDIOVERSION N/A 02/05/2020   Procedure: CARDIOVERSION;  Surgeon: Josue Hector, MD;  Location: [facility] ENDOSCOPY;  Service: Cardiovascular;  Laterality: N/A;   CATARACT EXTRACTION W/ INTRAOCULAR LENS IMPLANT Left    CORONARY ANGIOPLASTY  1994 X  2   "before bypass surgery"   CORONARY ANGIOPLASTY WITH STENT PLACEMENT  03/04/2004   SVG supplying the diagonal vessel stented with a 3.5x53mm Taxus stent post dilated to 4.0 mm   CORONARY ARTERY BYPASS GRAFT  1994   "CABG X4"   INGUINAL HERNIA REPAIR Right    PACEMAKER IMPLANT N/A 11/20/2019   Procedure: PACEMAKER IMPLANT;  Surgeon: Deboraha Sprang, MD;  Location: [facility] CV LAB;  Service: Cardiovascular;  Laterality: N/A;   RIGHT HEART CATH AND CORONARY/GRAFT ANGIOGRAPHY N/A 11/24/2016   Procedure: Right Heart Cath and Coronary/Graft Angiography;  Surgeon: Troy Sine, MD;  Location: [facility] CV LAB;  Service: Cardiovascular;  Laterality: N/A;   RIGHT/LEFT HEART CATH AND CORONARY/GRAFT ANGIOGRAPHY N/A 10/18/2019   Procedure: RIGHT/LEFT HEART CATH AND CORONARY/GRAFT ANGIOGRAPHY;  Surgeon: Troy Sine, MD;  Location: [place] CV LAB;  Service: Cardiovascular;  Laterality: N/A;   TEE WITHOUT CARDIOVERSION N/A 11/14/2019   Procedure: TRANSESOPHAGEAL ECHOCARDIOGRAM (TEE);  Surgeon: Burnell Blanks, MD;  Location: [place] [facility] CV LAB;  Service: Open Heart Surgery;  Laterality: N/A;   TONSILLECTOMY AND ADENOIDECTOMY     TRANSCATHETER AORTIC VALVE REPLACEMENT, TRANSFEMORAL N/A 11/14/2019   Procedure: TRANSCATHETER AORTIC VALVE REPLACEMENT, TRANSFEMORAL;  Surgeon: Burnell Blanks, MD;  Location: [place] CV LAB;  Service: Open Heart Surgery;  Laterality: N/A;   HPI:  Per 2 H&P "Jorge Cherry is a 86 y.o. male with medical history significant of CAD, CKD,Atrial fibrillation.    Family at bedside, wife and son.   Pt brought to ed via ems for double vision and unsteady gait that start at 3 pm today./   Pt got dizzy while putting lights on trailer, pt was then sitting in truck and while walking in the home he was holding on to side of door and was unsteady at same time. Pt also had slurred speech at same time. This is first time this has happened.   Pt had chest pain  one time in the cold about 4 days ago.   Pt sees. Dr kelley-cardiology, pt did take his NTG that day.   Pts symptoms resolved few minutes after EMS arrived as fas as slurred speech.   Pt forget his medicine last night.   Chart review shows pt has had MR of abdomen which shows pancreatic cyst and recommendation of repeat imaging in 3-6 months."   Head CT, "1. No evidence of acute intracranial abnormality. ASPECTS is 10. 2. Remote right occipital infarct and moderate to advanced chronic microvascular ischemic disease. 3. Cerebral Atrophy (ICD10-G31.9)."  CTA "1. Negative CTA for large vessel occlusion. 2. Eccentric bulky calcified plaque about the left carotid bulb/proximal left ICA with associated stenosis of up to 50-60% by NASCET criteria. 3. Atheromatous change about the right carotid bulb/proximal right ICA with associated stenosis of up to 40% by NASCET criteria. 4. Irregular fusiform aneurysm arising from the distal left PICA as above, measuring 3 mm in maximal diameter. 5. Diffuse tortuosity of the major arterial vasculature of the head and neck, suggesting chronic underlying hypertension."  Assessment / Plan / Recommendation Clinical Impression  Pt seen for cognitive-linguistic evaluation. Pt alert, pleasant, and cooperative. Wife present. Pt and wife deny pt with changes to speech/language/cognition at present, but not pt with slurred speech upon admission. Pt endorses diplopia.  Evaluation completed via informal means and selected subtests of Cognistat. Pt HOH and had B Hearing Aids donned for evaluation. Given hearing status, pt benefited from extra time and increased vocal loudness from communication partner. Otherwise, pt demonstrated intact basic speech/language and cognitive ability. Pt's speech is fluent, appropriate, and without s/sx dysarthria. Pt with intact memory (immediate, short term, working), abstract reasoning, and verbal problem solving.  Anticipate need for some  level of assistance with IADLs and possibly ADLs given visual changes (diplopia). No driving unless cleared by MD. Will defer to OT/PT for additional recommendations. No f/u SLP services warranted at this time.   Pt, pt's wife, and RN made aware of results, recommendations, and SLP POC. Pt and wife verbalized understanding/agreement.     SLP Assessment  SLP Recommendation/Assessment: Patient does not need any further Speech [place] Pathology Services SLP Visit Diagnosis: Dysarthria and anarthria (R47.1)    Recommendations for follow up therapy are one component of a multi-disciplinary discharge planning process, led by the attending physician.  Recommendations may be updated based on patient status, additional functional criteria and insurance authorization.    Follow Up Recommendations  No SLP follow up    Assistance Recommended at Discharge  Intermittent Supervision/Assistance (due to visual changes)  Functional Status Assessment Patient has not had a recent decline in their functional status (for speech/language/cognition; visual deficits noted)        SLP Evaluation Cognition  Overall Cognitive Status: Within Functional Limits for tasks assessed Arousal/Alertness: Awake/alert Orientation Level: Oriented X4 Memory: Appears intact Awareness: Appears intact Problem Solving: Appears intact Safety/Judgment: Appears intact       Comprehension  Auditory Comprehension Overall Auditory Comprehension: Appears within functional limits for tasks assessed Yes/No Questions: Within Functional Limits Commands: Within Functional Limits Conversation: Complex [facility]) Interfering Components: Hearing EffectiveTechniques: Extra processing time;Increased volume (due to hearing) Visual Recognition/Discrimination Discrimination:  (DNT) Reading Comprehension Reading Status:  (DNT)    Expression Expression Primary Mode of Expression: Verbal Verbal Expression Overall Verbal Expression: Appears  within functional limits for tasks assessed Initiation: No impairment Automatic Speech:  (WFL) Level of Generative/Spontaneous Verbalization: Conversation [facility]) Repetition: No impairment (extra time; occasional repetition due to hearing status) Naming: No impairment   Oral / Motor  Oral Motor/Sensory Function Overall Oral Motor/Sensory Function: Within functional limits Motor Speech Overall Motor Speech: Appears within functional limits for tasks assessed Respiration: Within functional limits Phonation: Normal Resonance: Within functional limits Articulation: Within functional limitis Intelligibility: Intelligible Motor Planning: Witnin functional limits           Cherrie Gauze, M.S., [facility] [phone] ([place])   Quintella Baton 06/29/2021, 11:26 AM

## 2021-06-30 ENCOUNTER — Inpatient Hospital Stay: Payer: Medicare PPO

## 2021-06-30 DIAGNOSIS — R29701 NIHSS score 1: Secondary | ICD-10-CM | POA: Diagnosis present

## 2021-06-30 DIAGNOSIS — Z955 Presence of coronary angioplasty implant and graft: Secondary | ICD-10-CM | POA: Diagnosis not present

## 2021-06-30 DIAGNOSIS — Z20822 Contact with and (suspected) exposure to covid-19: Secondary | ICD-10-CM | POA: Diagnosis present

## 2021-06-30 DIAGNOSIS — T45516A Underdosing of anticoagulants, initial encounter: Secondary | ICD-10-CM | POA: Diagnosis present

## 2021-06-30 DIAGNOSIS — I6523 Occlusion and stenosis of bilateral carotid arteries: Secondary | ICD-10-CM | POA: Diagnosis present

## 2021-06-30 DIAGNOSIS — H510 Palsy (spasm) of conjugate gaze: Secondary | ICD-10-CM | POA: Diagnosis present

## 2021-06-30 DIAGNOSIS — R4781 Slurred speech: Secondary | ICD-10-CM | POA: Diagnosis present

## 2021-06-30 DIAGNOSIS — Z7901 Long term (current) use of anticoagulants: Secondary | ICD-10-CM | POA: Diagnosis not present

## 2021-06-30 DIAGNOSIS — I442 Atrioventricular block, complete: Secondary | ICD-10-CM | POA: Diagnosis present

## 2021-06-30 DIAGNOSIS — Z91138 Patient's unintentional underdosing of medication regimen for other reason: Secondary | ICD-10-CM | POA: Diagnosis not present

## 2021-06-30 DIAGNOSIS — Z952 Presence of prosthetic heart valve: Secondary | ICD-10-CM | POA: Diagnosis not present

## 2021-06-30 DIAGNOSIS — Z950 Presence of cardiac pacemaker: Secondary | ICD-10-CM | POA: Diagnosis not present

## 2021-06-30 DIAGNOSIS — I130 Hypertensive heart and chronic kidney disease with heart failure and stage 1 through stage 4 chronic kidney disease, or unspecified chronic kidney disease: Secondary | ICD-10-CM | POA: Diagnosis present

## 2021-06-30 DIAGNOSIS — H532 Diplopia: Secondary | ICD-10-CM | POA: Diagnosis present

## 2021-06-30 DIAGNOSIS — I671 Cerebral aneurysm, nonruptured: Secondary | ICD-10-CM | POA: Diagnosis present

## 2021-06-30 DIAGNOSIS — I639 Cerebral infarction, unspecified: Secondary | ICD-10-CM | POA: Diagnosis present

## 2021-06-30 DIAGNOSIS — N183 Chronic kidney disease, stage 3 unspecified: Secondary | ICD-10-CM | POA: Diagnosis present

## 2021-06-30 DIAGNOSIS — I6340 Cerebral infarction due to embolism of unspecified cerebral artery: Secondary | ICD-10-CM | POA: Diagnosis present

## 2021-06-30 DIAGNOSIS — I5032 Chronic diastolic (congestive) heart failure: Secondary | ICD-10-CM | POA: Diagnosis present

## 2021-06-30 DIAGNOSIS — K862 Cyst of pancreas: Secondary | ICD-10-CM | POA: Diagnosis present

## 2021-06-30 DIAGNOSIS — I447 Left bundle-branch block, unspecified: Secondary | ICD-10-CM | POA: Diagnosis present

## 2021-06-30 DIAGNOSIS — D649 Anemia, unspecified: Secondary | ICD-10-CM | POA: Diagnosis present

## 2021-06-30 DIAGNOSIS — N400 Enlarged prostate without lower urinary tract symptoms: Secondary | ICD-10-CM | POA: Diagnosis present

## 2021-06-30 DIAGNOSIS — E032 Hypothyroidism due to medicaments and other exogenous substances: Secondary | ICD-10-CM | POA: Diagnosis present

## 2021-06-30 DIAGNOSIS — I480 Paroxysmal atrial fibrillation: Secondary | ICD-10-CM | POA: Diagnosis present

## 2021-06-30 LAB — APTT
aPTT: 91 seconds — ABNORMAL HIGH (ref 24–36)
aPTT: 87 seconds — ABNORMAL HIGH (ref 24–36)

## 2021-06-30 LAB — CBC
HCT: 37.3 % — ABNORMAL LOW (ref 39.0–52.0)
Hemoglobin: 12 g/dL — ABNORMAL LOW (ref 13.0–17.0)
MCH: 29.5 pg (ref 26.0–34.0)
MCHC: 32.2 g/dL (ref 30.0–36.0)
MCV: 91.6 fL (ref 80.0–100.0)
Platelets: 145 10*3/uL — ABNORMAL LOW (ref 150–400)
RBC: 4.07 MIL/uL — ABNORMAL LOW (ref 4.22–5.81)
RDW: 14.3 % (ref 11.5–15.5)
WBC: 6.8 10*3/uL (ref 4.0–10.5)
nRBC: 0 % (ref 0.0–0.2)

## 2021-06-30 LAB — HEPARIN LEVEL (UNFRACTIONATED): Heparin Unfractionated: >1.1 IU/mL — ABNORMAL HIGH (ref 0.30–0.70)

## 2021-06-30 IMAGING — CT CT HEAD W/O CM
4 series · 17 of 47 positions shown, 19 images · non-contrast
Comparison: Previous studies including the examination of
[DATE]

CLINICAL DATA: Diplopia



[Series 2: head wo · axial · 0.48mm/px · z∈[-95,+25]mm · 7 of 34 slices shown, 9 images]
[im 5/34  brain]
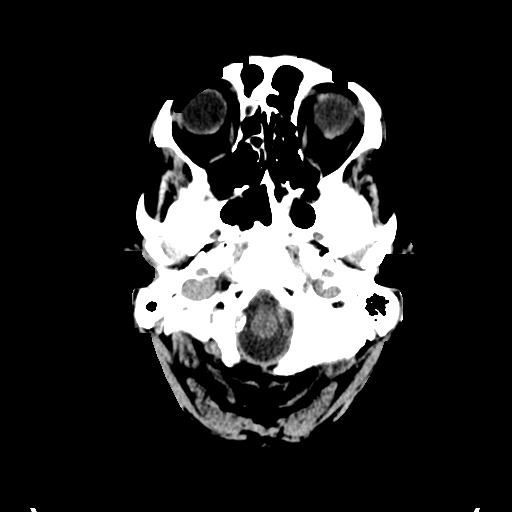
[im 5/34  bone]
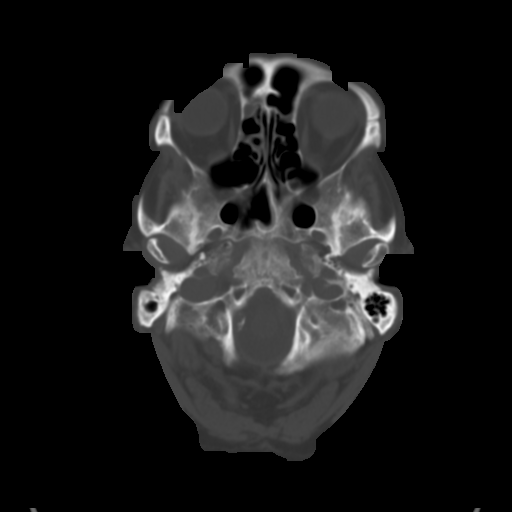
[im 9/34  brain]
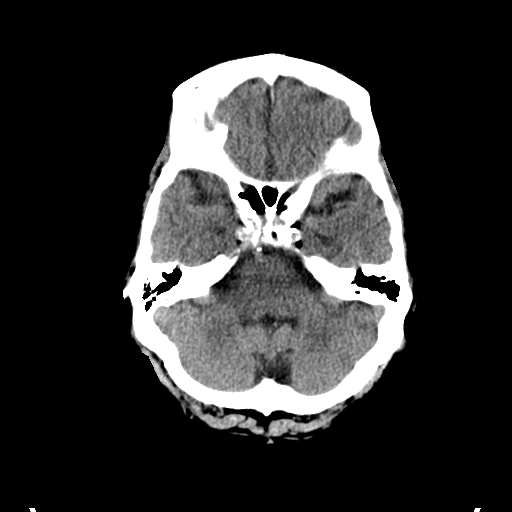
[im 13/34  brain]
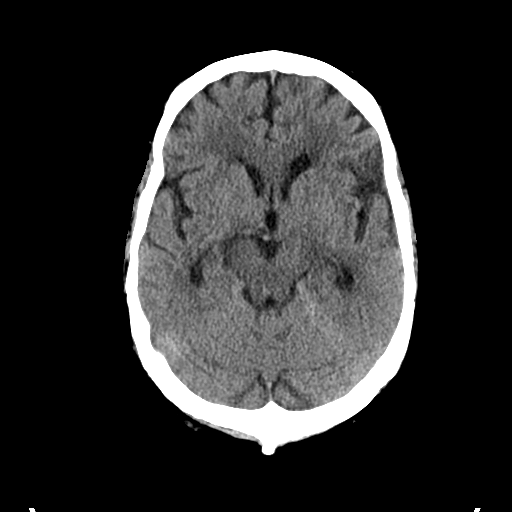
[im 17/34  brain]
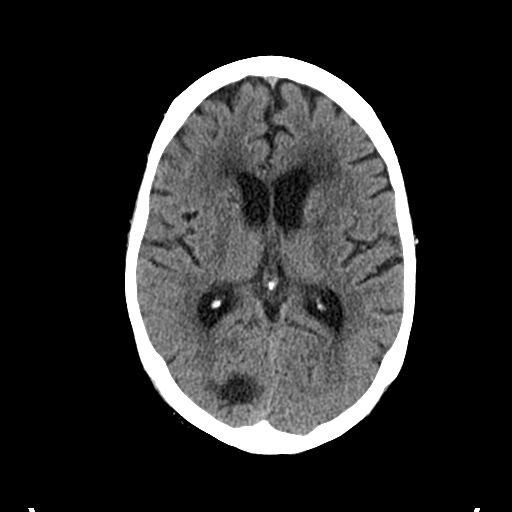
[im 21/34  brain]
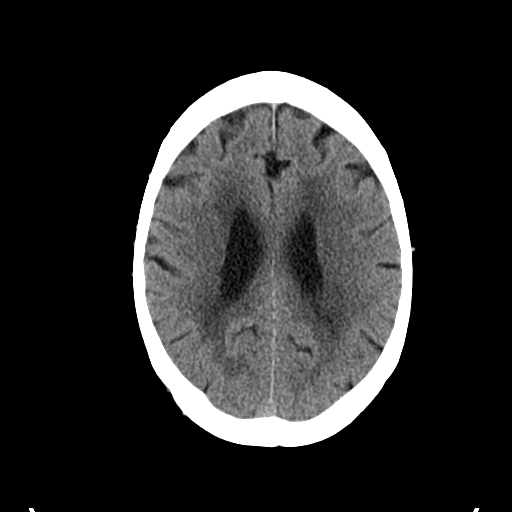
[im 21/34  bone]
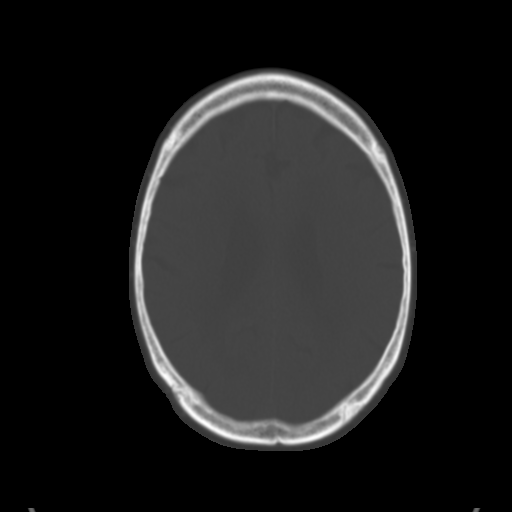
[im 25/34  brain]
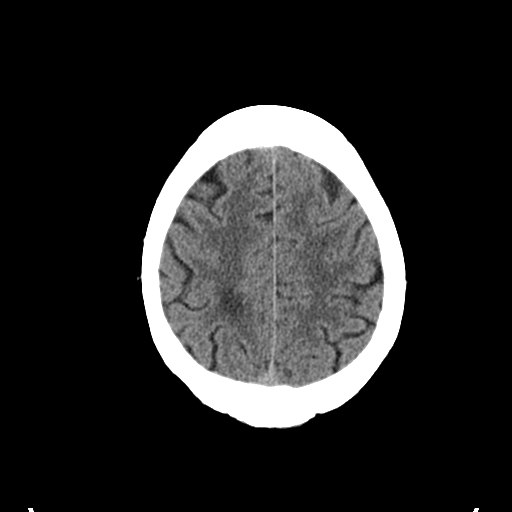
[im 29/34  brain]
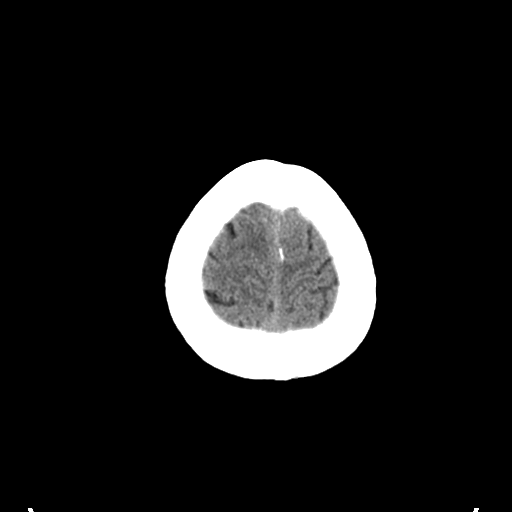

[Series 3: head bone · axial · 0.48mm/px · z∈[-99,-41]mm · 4 of 84 slices shown]
[im 9/84  bone]
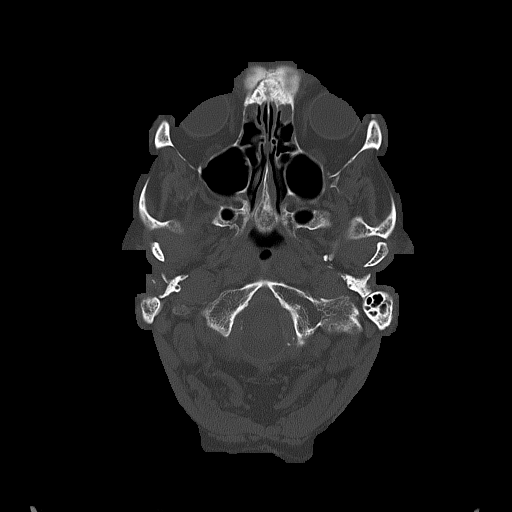
[im 17/84  bone]
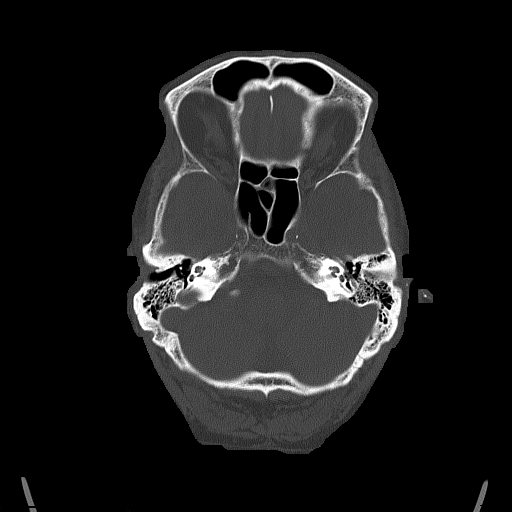
[im 25/84  bone]
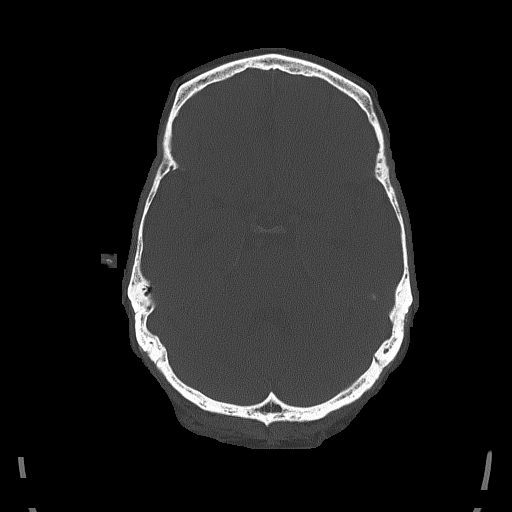
[im 38/84  bone]
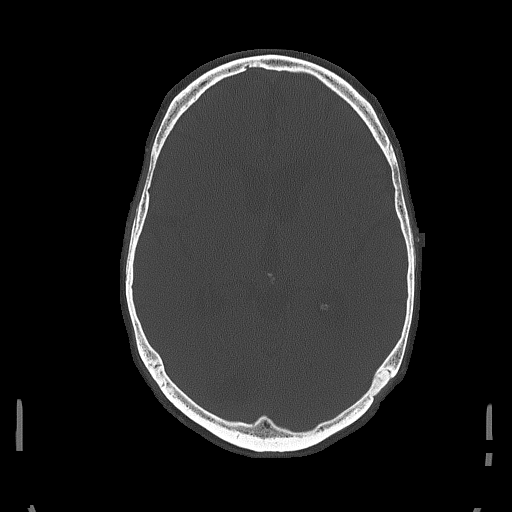

[Series 4: coronal soft tissue · coronal · 0.36mm/px · 3 of 73 slices shown]
[im 25/73  brain]
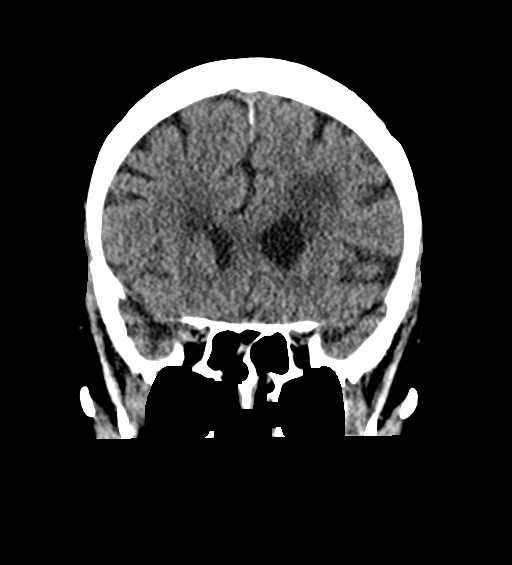
[im 33/73  brain]
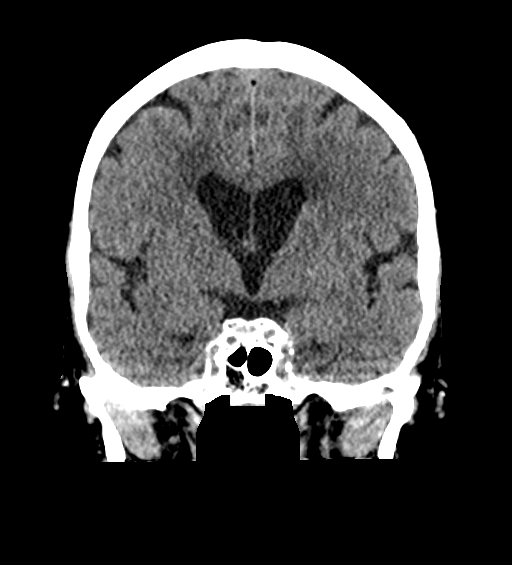
[im 41/73  brain]
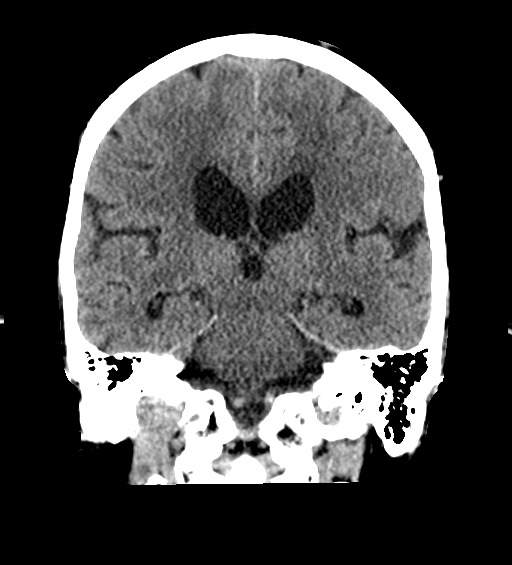

[Series 5: sagittal soft tissue · sagittal · 0.41mm/px · 3 of 58 slices shown]
[im 20/58  brain]
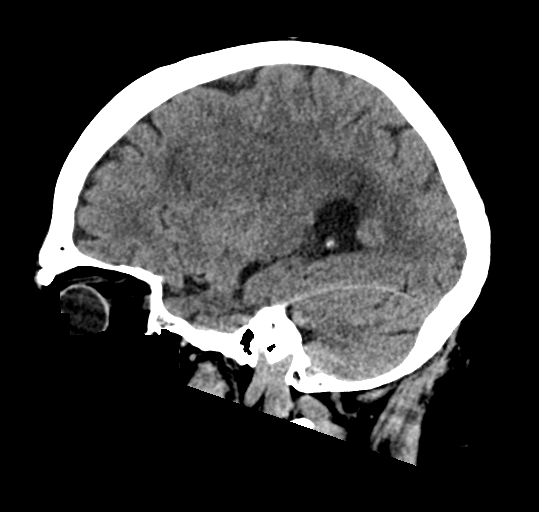
[im 29/58  brain]
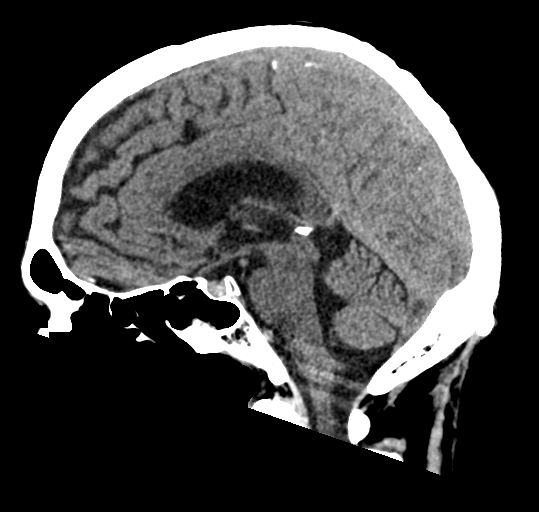
[im 39/58  brain]
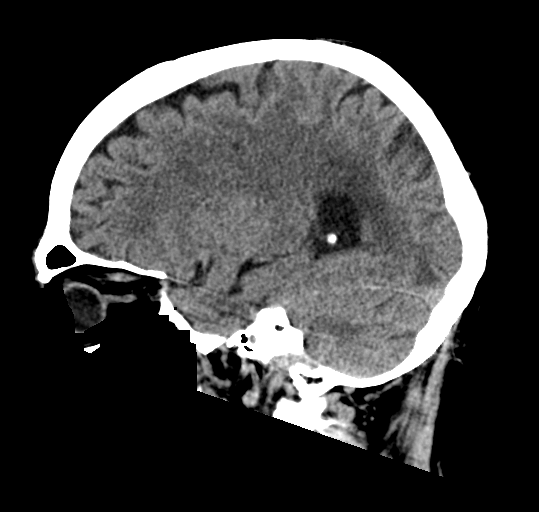

[17 of 47 positions shown; findings below may reference images not displayed]

FINDINGS: Brain: No acute intracranial findings are seen. Old infarct in the
right occipital lobe has not changed. Cortical sulci are prominent.
There is decreased density in the periventricular and subcortical
white matter.

Vascular: There are scattered arterial calcifications.

Skull: Unremarkable.

Sinuses/Orbits: There is mild mucosal thickening in the ethmoid
sinus.

Other: None
IMPRESSION: No acute intracranial findings are seen in noncontrast CT brain. Old
infarct is seen in the right occipital lobe with no significant
change. Atrophy. Small-vessel disease.

Mild chronic ethmoid sinusitis.

## 2021-06-30 MED ORDER — ATORVASTATIN CALCIUM 20 MG PO TABS
40.0000 mg | ORAL_TABLET | Freq: Every day | ORAL | Status: DC
Start: 2021-06-30 — End: 2021-07-01
  Administered 2021-06-30: 21:00:00 40 mg via ORAL
  Filled 2021-06-30: qty 2

## 2021-06-30 MED ORDER — APIXABAN 5 MG PO TABS
5.0000 mg | ORAL_TABLET | Freq: Two times a day (BID) | ORAL | Status: DC
  Administered 2021-06-30 – 2021-07-01 (×2): 5 mg via ORAL
  Filled 2021-06-30 (×2): qty 1

## 2021-06-30 NOTE — Progress Notes (Signed)
ANTICOAGULATION CONSULT NOTE - Initial Consult  Pharmacy Consult for Heparin (transitioning from Eliquis) Indication: atrial fibrillation/possible surgery on 2/20 or 2/21   Allergies  Allergen Reactions   Altace [Ramipril] Swelling and Other (See Comments)    Mouth swelling   Mucinex [Guaifenesin Er] Hives, Swelling and Other (See Comments)    Mouth swelling   Azithromycin     Other reaction(s): Does not work   Contrast Media [Iodinated Contrast Media] Other (See Comments)    Made eyes change each time    Patient Measurements: Height: 5\' 8"  (172.7 cm) Weight: 82.5 kg (181 lb 14.1 oz) IBW/kg (Calculated) : 68.4 Heparin Dosing Weight:  86.4 kg   Vital Signs: Temp: 97.8 F (36.6 C) (02/20 1134) Temp Source: Oral (02/20 1134) BP: 117/57 (02/20 1134) Pulse Rate: 59 (02/20 1134)  Labs: Recent Labs    06/28/21 1723 06/28/21 1723 06/28/21 1817 06/28/21 2349 06/29/21 0106 06/29/21 0145 06/29/21 0406 06/29/21 1746 06/30/21 0322 06/30/21 1125  HGB 12.5*  --   --   --   --   --  13.1  --  12.0*  --   HCT 38.8*  --   --   --   --   --  40.3  --  37.3*  --   PLT 160  --   --   --   --   --  161  --  145*  --   APTT  --    < > 33  --   --   --   --  58* 91* 87*  LABPROT  --   --  14.2  --   --   --   --   --   --   --   INR  --   --  1.1  --   --   --   --   --   --   --   HEPARINUNFRC  --   --   --   --   --  >1.10*  --   --  >1.10*  --   CREATININE 1.43*  --   --   --   --   --   --   --   --   --   TROPONINIHS  --   --   --  27* 26*  --   --   --   --   --    < > = values in this interval not displayed.     Estimated Creatinine Clearance: 41 mL/min (A) (by C-G formula based on SCr of 1.43 mg/dL (H)).   Medical History: Past Medical History:  Diagnosis Date   Arthritis    "just a touch in my hands" (11/24/2016)   Asthma    Atherosclerosis of renal artery ([place])    RENAL DOPPLER, 12/10/2011 - Left renal artery demonstrated narrowing with elevated velocities consistent  with a 1-59% diameter reduction   CKD (chronic kidney disease) stage 3, GFR 30-59 ml/min (HCC)    "stable now since they backed off the water pills" (11/24/2016)   Coronary artery disease    a. 1994 s/p CABG x 4 (LIMA-LAD, VG->D2, VG->OM, VG->RCA); b. 02/2004 PCI SVG-D2 (3.5x16 Taxus DES). VG->RCA 100. Sev apical LAD dzs distal to LIMA insertion; c. 11/2016 Cath: LAD 95p/116m, D2 100ost, LCX 100ost, 70p/m, RCA 100p/m, RPDA fills via L->R collats. VG->RCA 100, VG->D2 20 ost, patent prox stent, 31m, LIMA->LAD ok, VG->OM3 20p. EF 55%-->Med Rx.   GERD (gastroesophageal  reflux disease)    High cholesterol    History of lower GI bleeding    a. 09/2014 GIB due to diverticulosis/diverticulitis.   Iron deficiency anemia    Labile Hypertension    Moderate aortic stenosis    a. 10/2011 Echo:  EF >55%, mild-mod TR, mild-mod AS, mod Ca2+ of AoV leaflets; b. 07/2016 Echo: EF 60-65%, no rwma, Gr1 DD, mod AS [(S) mean grad 57mmHg, peak grad 53mmHg. Valve area (VTI): 1.33cm^2, (Vmax) 1.44cm^2. Mild MR]; c. 02/2018 Echo: EF 55-60%, no rwma, GR1 DD, Mod AS [Peak Vel (S): 319cm/s, Mean grad (S) 65mmHg, peak grad (S) 79mmHg].   PAF (paroxysmal atrial fibrillation) (HCC)    a. CHA2DS2VASc = 4-->Eliquis.   S/P TAVR (transcatheter aortic valve replacement) 11/14/2019   s/p TAVR with a 26 mm Edwards S3U via the TF approach by Drs Angelena Form & Bartle   Sleep apnea     Assessment: Pharmacy consulted to dose heparin in this 86 year old male with Afib.  Pt was on Eliquis 5 mg PO BID ,  last dose on 2/18 @ 2215.   Pt may go for surgery on 2/20 or 2/21 so MD wants to transition to Heparin.  Date Time aPTT/HL Rate/comment 2/20 0322 91/>1.10 1250 un/hr, therapeutic   2/20 1125 87  1250 un/hr, therapeutic   Goal of Therapy:  Heparin level 0.3-0.7 units/ml aPTT 66 - 102 seconds Monitor platelets by anticoagulation protocol: Yes   Plan:  aPTT 87, therapeutic Continue heparin infusion at 1250 un/hr Continue to use aPTT to  guide dosing until aPTT and heparin level correlate  Check aPTT and heparin level with AM labs Monitor daily CBC, s/s of bleed   Darnelle Bos, PharmD 06/30/2021,12:07 PM

## 2021-06-30 NOTE — Progress Notes (Signed)
ANTICOAGULATION CONSULT NOTE - Initial Consult  Pharmacy Consult for Heparin (transitioning from Eliquis) Indication: atrial fibrillation/possible surgery on 2/20 or 2/21   Allergies  Allergen Reactions   Altace [Ramipril] Swelling and Other (See Comments)    Mouth swelling   Mucinex [Guaifenesin Er] Hives, Swelling and Other (See Comments)    Mouth swelling   Azithromycin     Other reaction(s): Does not work   Contrast Media [Iodinated Contrast Media] Other (See Comments)    Made eyes change each time    Patient Measurements: Height: 5\' 8"  (172.7 cm) Weight: 82.5 kg (181 lb 14.1 oz) IBW/kg (Calculated) : 68.4 Heparin Dosing Weight:  86.4 kg   Vital Signs: Temp: 98.1 F (36.7 C) (02/20 0325) Temp Source: Oral (02/20 0325) BP: 118/73 (02/20 0325) Pulse Rate: 63 (02/20 0325)  Labs: Recent Labs    06/28/21 1723 06/28/21 1817 06/28/21 2349 06/29/21 0106 06/29/21 0145 06/29/21 0406 06/29/21 1746 06/30/21 0322  HGB 12.5*  --   --   --   --  13.1  --  12.0*  HCT 38.8*  --   --   --   --  40.3  --  37.3*  PLT 160  --   --   --   --  161  --  145*  APTT  --  33  --   --   --   --  58* 91*  LABPROT  --  14.2  --   --   --   --   --   --   INR  --  1.1  --   --   --   --   --   --   HEPARINUNFRC  --   --   --   --  >1.10*  --   --  >1.10*  CREATININE 1.43*  --   --   --   --   --   --   --   TROPONINIHS  --   --  27* 26*  --   --   --   --      Estimated Creatinine Clearance: 41 mL/min (A) (by C-G formula based on SCr of 1.43 mg/dL (H)).   Medical History: Past Medical History:  Diagnosis Date   Arthritis    "just a touch in my hands" (11/24/2016)   Asthma    Atherosclerosis of renal artery ([place])    RENAL DOPPLER, 12/10/2011 - Left renal artery demonstrated narrowing with elevated velocities consistent with a 1-59% diameter reduction   CKD (chronic kidney disease) stage 3, GFR 30-59 ml/min (HCC)    "stable now since they backed off the water pills" (11/24/2016)    Coronary artery disease    a. 1994 s/p CABG x 4 (LIMA-LAD, VG->D2, VG->OM, VG->RCA); b. 02/2004 PCI SVG-D2 (3.5x16 Taxus DES). VG->RCA 100. Sev apical LAD dzs distal to LIMA insertion; c. 11/2016 Cath: LAD 95p/151m, D2 100ost, LCX 100ost, 70p/m, RCA 100p/m, RPDA fills via L->R collats. VG->RCA 100, VG->D2 20 ost, patent prox stent, 31m, LIMA->LAD ok, VG->OM3 20p. EF 55%-->Med Rx.   GERD (gastroesophageal reflux disease)    High cholesterol    History of lower GI bleeding    a. 09/2014 GIB due to diverticulosis/diverticulitis.   Iron deficiency anemia    Labile Hypertension    Moderate aortic stenosis    a. 10/2011 Echo:  EF >55%, mild-mod TR, mild-mod AS, mod Ca2+ of AoV leaflets; b. 07/2016 Echo: EF 60-65%, no rwma, Gr1 DD, mod  AS [(S) mean grad 1mmHg, peak grad 85mmHg. Valve area (VTI): 1.33cm^2, (Vmax) 1.44cm^2. Mild MR]; c. 02/2018 Echo: EF 55-60%, no rwma, GR1 DD, Mod AS [Peak Vel (S): 319cm/s, Mean grad (S) 72mmHg, peak grad (S) 67mmHg].   PAF (paroxysmal atrial fibrillation) (HCC)    a. CHA2DS2VASc = 4-->Eliquis.   S/P TAVR (transcatheter aortic valve replacement) 11/14/2019   s/p TAVR with a 26 mm Edwards S3U via the TF approach by Drs Angelena Form & Bartle   Sleep apnea     Assessment: Pharmacy consulted to dose heparin in this 86 year old male with Afib.  Pt was on Eliquis 5 mg PO BID ,  last dose on 2/18 @ 2215.   Pt may go for surgery on 2/20 or 2/21 so MD wants to transition to Heparin.   Goal of Therapy:  Heparin level 0.3-0.7 units/ml aPTT 66 - 102 seconds Monitor platelets by anticoagulation protocol: Yes   Plan:  2/20 @ 0322:  aPTT = 91,   HL = > 1.1 aPTT is therapeutic X 1 but HL is still elevated from prior use of Eliquis.  Continue to use aPTT to guide dosing.  Will continue pt on current rate and recheck aPTT in 8 hrs. Will recheck HL on 2/21 with AM labs.   Jahki Witham D 06/30/2021,4:55 AM

## 2021-06-30 NOTE — Progress Notes (Signed)
Progress Note:    Jorge Cherry    [id number] DOB: 17-Jul-1935 DOA: 06/28/2021  PCP: Prince Solian, MD    Brief Narrative:   Jorge Cherry is a 86 y.o. male with a PMH significant for CAD, CKD, Afib. This patient presented from home to the ED on 06/28/2021 with acute onset visual changes. He states he got dizzy and had double vision.  In the ED, it was found that they had EOM abnormalities. Initial head imaging ws negative. Neurology was consulted for further evaluation for suspected stroke. She was treated with home apixaban, atorvastatin. Vital signs remained stable.  Patient was admitted to medicine service for further workup and management of suspected stroke as outlined in detail below    Subjective:   No acute events reported by nursing staff overnight.  States his double vision is "a fraction better".  No focal neurological deficits.  Remains on heparin drip.  He did wear his CPAP last night.  No other acute complaints.    Assessment and Plan:   Diplopia on left gaze: Secondary to acute cardioembolic stroke due to missed dose of apixaban.  Neurology following in consultation.  Discussed with Dr. Leonel Ramsay and he recommended another CT head after 48 hours.  This will be scheduled for tonight at 6 PM.  Pacemaker not MRI compatible.  PT recommends home health PT. OT recommends home health OT.  Cleared by speech for regular diet with thin liquids.  No speech follow-up needed.  Neurology states can continue heparin drip until follow-up CT head this evening.  Then can convert back to home apixaban.  LDL will therefore will increase patient's atorvastatin to 40 mg daily.  A1c unremarkable at 5.5.  Showed LVEF 50 to 55% with no regional wall motion abnormalities, no mention of shunt or LVT.  Fusiform aneurysm distal left PICA 3 mm: 5-year rupture risk 1.7%.  Need to repeat CTA head in 6 months for aneurysm surveillance  Stable angina, coronary artery disease status post CABG,  A-fib, HFpEF, HTN, HLD, severe aortic stenosis status post TAVR, complete heart block status post pacemaker: Patient denies any chest pain.  Continue home Ranexa, Toprol-XL, Imdur.  Home Zetia and Tricor on hold.  Home atorvastatin increased as mentioned above.  Patient appears euvolemic  Chronic kidney disease stage II: Creatinine appears at baseline.  Hypothyroidism: Well-controlled continue home levothyroxine  BPH: Continue home tamsulosin  Other information:    DVT prophylaxis: Heparin drip Code Status: Full code Family Communication: No family present at bedside upon my evaluation Disposition:   Status is: Observation The patient will require care spanning > 2 midnights and should be moved to inpatient because: Repeat CT head at 48 hours this evening    Consultants:   Neurology    Objective:    Vitals:   06/30/21 0325 06/30/21 0522 06/30/21 0728 06/30/21 1134  BP: 118/73 (!) 147/87 123/66 (!) 117/57  Pulse: 63 67 (!) 52 (!) 59  Resp: 17 18 17 17   Temp: 98.1 F (36.7 C) 98.6 F (37 C) 97.6 F (36.4 C) 97.8 F (36.6 C)  TempSrc: Oral  Oral Oral  SpO2: 93% 94% 97% 94%  Weight:      Height:        Intake/Output Summary (Last 24 hours) at 06/30/2021 1136 Last data filed at 06/30/2021 1026 Gross per 24 hour  Intake 480 ml  Output 300 ml  Net 180 ml   Filed Weights   06/28/21 1635 06/28/21 2334  Weight: 88.5 kg 82.5 kg       Physical Exam:    General exam: Appears calm and comfortable  Respiratory system: Clear to auscultation. Respiratory effort normal. Cardiovascular system: S1 & S2 heard, RRR. No JVD, murmurs, rubs, gallops or clicks. No pedal edema. Gastrointestinal system: Abdomen is nondistended, soft and nontender. No organomegaly or masses felt. Normal bowel sounds heard.  Central nervous system: Alert and oriented. No focal neurological deficits.  Double vision in the horizontal on left gaze and right eye restricted to midline left  gaze  Extremities: Symmetric 5 x 5 power. Skin: No rashes, lesions or ulcers Psychiatry: Judgement and insight appear normal. Mood & affect appropriate.     Data Reviewed:    I have personally reviewed following labs and imaging studies  CBC: Recent Labs  Lab 06/28/21 1723 06/29/21 0406 06/30/21 0322  WBC 8.3 7.8 6.8  NEUTROABS 6.3  --   --   HGB 12.5* 13.1 12.0*  HCT 38.8* 40.3 37.3*  MCV 92.2 91.2 91.6  PLT 160 161 145*    Basic Metabolic Panel: Recent Labs  Lab 06/28/21 1723  NA 141  K 3.3*  CL 108  CO2 26  GLUCOSE 116*  BUN 26*  CREATININE 1.43*  CALCIUM 9.1    GFR: Estimated Creatinine Clearance: 41 mL/min (A) (by C-G formula based on SCr of 1.43 mg/dL (H)).  Liver Function Tests: Recent Labs  Lab 06/28/21 1723  AST 25  ALT 22  ALKPHOS 43  BILITOT 0.6  PROT 6.5  ALBUMIN 3.5    CBG: No results for input(s): GLUCAP in the last 168 hours.   Recent Results (from the past 240 hour(s))  Resp Panel by RT-PCR (Flu A&B, Covid) Nasopharyngeal Swab     Status: None   Collection Time: 06/28/21  6:17 PM   Specimen: Nasopharyngeal Swab; Nasopharyngeal(NP) swabs in vial transport medium  Result Value Ref Range Status   SARS Coronavirus 2 by RT PCR NEGATIVE NEGATIVE Final    Comment: (NOTE) SARS-CoV-2 target nucleic acids are NOT DETECTED.  The SARS-CoV-2 RNA is generally detectable in upper respiratory specimens during the acute phase of infection. The lowest concentration of SARS-CoV-2 viral copies this assay can detect is 138 copies/mL. A negative result does not preclude SARS-Cov-2 infection and should not be used as the sole basis for treatment or other patient management decisions. A negative result may occur with  improper specimen collection/handling, submission of specimen other than nasopharyngeal swab, presence of viral mutation(s) within the areas targeted by this assay, and inadequate number of viral copies(<138 copies/mL). A negative  result must be combined with clinical observations, patient history, and epidemiological information. The expected result is Negative.  Fact Sheet for Patients:  EntrepreneurPulse.com.au  Fact Sheet for Healthcare Providers:  IncredibleEmployment.be  This test is no t yet approved or cleared by the [place] FDA and  has been authorized for detection and/or diagnosis of SARS-CoV-2 by FDA under an Emergency Use Authorization (EUA). This EUA will remain  in effect (meaning this test can be used) for the duration of the COVID-19 declaration under Section 564(b)(1) of the Act, 21 U.S.C.section 360bbb-3(b)(1), unless the authorization is terminated  or revoked sooner.       Influenza A by PCR NEGATIVE NEGATIVE Final   Influenza B by PCR NEGATIVE NEGATIVE Final    Comment: (NOTE) The Xpert Xpress SARS-CoV-2/FLU/RSV plus assay is intended as an aid in the diagnosis of influenza from Nasopharyngeal swab specimens and should not be  used as a sole basis for treatment. Nasal washings and aspirates are unacceptable for Xpert Xpress SARS-CoV-2/FLU/RSV testing.  Fact Sheet for Patients: BloggerCourse.com  Fact Sheet for Healthcare Providers: SeriousBroker.it  This test is not yet approved or cleared by the [place] FDA and has been authorized for detection and/or diagnosis of SARS-CoV-2 by FDA under an Emergency Use Authorization (EUA). This EUA will remain in effect (meaning this test can be used) for the duration of the COVID-19 declaration under Section 564(b)(1) of the Act, 21 U.S.C. section 360bbb-3(b)(1), unless the authorization is terminated or revoked.  Performed at [facility], [address], [place], [place] [address]          Radiology Studies:    CT ANGIO HEAD NECK W WO CM  Result Date: 06/29/2021 CLINICAL DATA:  Initial evaluation for neuro deficit, stroke  suspected. EXAM: CT ANGIOGRAPHY HEAD AND NECK TECHNIQUE: Multidetector CT imaging of the head and neck was performed using the standard protocol during bolus administration of intravenous contrast. Multiplanar CT image reconstructions and MIPs were obtained to evaluate the vascular anatomy. Carotid stenosis measurements (when applicable) are obtained utilizing NASCET criteria, using the distal internal carotid diameter as the denominator. RADIATION DOSE REDUCTION: This exam was performed according to the departmental dose-optimization program which includes automated exposure control, adjustment of the mA and/or kV according to patient size and/or use of iterative reconstruction technique. CONTRAST:  15mL OMNIPAQUE IOHEXOL 350 MG/ML SOLN COMPARISON:  Head CT from earlier the same day. FINDINGS: CTA NECK FINDINGS Aortic arch: Visualized aortic arch normal in caliber with normal branch pattern. Moderate atheromatous change seen about the arch itself. No high-grade stenosis about the origin the great vessels. Right carotid system: Right CCA patent without stenosis. Scattered calcified plaque about the right carotid bulb/proximal right ICA with associated stenosis of up to 40% by NASCET criteria. Right ICA mildly tortuous but widely patent distally without stenosis or dissection. Left carotid system: Left CCA patent from its origin to the bifurcation without stenosis. Eccentric bulky calcified plaque about the left carotid bulb/proximal left ICA with associated stenosis of up to 50-60% stenosis by NASCET criteria. Left ICA tortuous but widely patent distally without stenosis or dissection. Vertebral arteries: Both vertebral arteries arise from the subclavian arteries. Vertebral arteries mildly tortuous but are widely patent without stenosis or dissection. Skeleton: No discrete or worrisome osseous lesions. Advanced spondylosis present at C5-6. Patient is edentulous. Other neck: No other acute soft tissue abnormality  within the neck. Upper chest: Visualized upper chest demonstrates no acute finding. Left-sided pacemaker/AICD noted. Review of the MIP images confirms the above findings CTA HEAD FINDINGS Anterior circulation: Petrous segments patent bilaterally. Atheromatous change within the carotid siphons with no more than mild multifocal narrowing. A1 segments patent bilaterally. Right A1 hypoplastic. Normal anterior communicating artery complex. Anterior cerebral arteries patent without stenosis. No M1 stenosis or occlusion. Normal MCA bifurcations. Distal MCA branches perfused and symmetric. Posterior circulation: Atheromatous change within the proximal V4 segments bilaterally with associated mild stenosis on the right. Both PICA patent at their origins. There is a fusiform aneurysm measuring up to 3 mm in maximal transverse diameter involving the distal left PICA (series 9, image 250). Additional smaller focal aneurysm noted slightly distally as well (series 10, image 148). Basilar patent to its distal aspect without stenosis. Superior cerebellar arteries patent bilaterally. Both PCAs primarily supplied via the basilar well perfused or distal aspects. Venous sinuses: Patent allowing for timing the contrast bolus. Anatomic variants: None significant. Review  of the MIP images confirms the above findings IMPRESSION: 1. Negative CTA for large vessel occlusion. 2. Eccentric bulky calcified plaque about the left carotid bulb/proximal left ICA with associated stenosis of up to 50-60% by NASCET criteria. 3. Atheromatous change about the right carotid bulb/proximal right ICA with associated stenosis of up to 40% by NASCET criteria. 4. Irregular fusiform aneurysm arising from the distal left PICA as above, measuring 3 mm in maximal diameter. 5. Diffuse tortuosity of the major arterial vasculature of the head and neck, suggesting chronic underlying hypertension. Electronically Signed   By: Jeannine Boga M.D.   On: 06/29/2021  00:03   CT HEAD WO CONTRAST (5MM)  Result Date: 06/29/2021 CLINICAL DATA:  Stroke, follow-up. Isolated right medial rectus palsy. Diplopia. EXAM: CT HEAD WITHOUT CONTRAST TECHNIQUE: Contiguous axial images were obtained from the base of the skull through the vertex without intravenous contrast. RADIATION DOSE REDUCTION: This exam was performed according to the departmental dose-optimization program which includes automated exposure control, adjustment of the mA and/or kV according to patient size and/or use of iterative reconstruction technique. COMPARISON:  CT studies done yesterday. FINDINGS: Brain: No change or acute finding by CT. No abnormality seen affecting the brainstem or cerebellum. There is old infarction in the right occipital lobe. There are extensive chronic small-vessel ischemic changes of the hemispheric white matter. No mass, hemorrhage, hydrocephalus or extra-axial collection. Vascular: There is atherosclerotic calcification of the major vessels at the base of the brain. Skull: Negative Sinuses/Orbits: Clear/normal Other: None IMPRESSION: No change or acute CT finding. Old right occipital infarction. Extensive chronic small-vessel ischemic changes of the hemispheric white matter. Electronically Signed   By: Nelson Chimes M.D.   On: 06/29/2021 18:10   ECHOCARDIOGRAM COMPLETE BUBBLE STUDY  Result Date: 06/29/2021    ECHOCARDIOGRAM REPORT   Patient Name:   DONNIE GHILARDI Date of Exam: 06/29/2021 Medical Rec #:  [id number]    Height:       68.0 in Accession #:    [id number]   Weight:       181.9 lb Date of Birth:  Jul 02, 1935    BSA:          1.963 m Patient Age:    13 years     BP:           176/94 mmHg Patient Gender: M            HR:           75 bpm. Exam Location:  [facility] Procedure: 2D Echo, Cardiac Doppler, Color Doppler and Saline Contrast Bubble            Study Indications:     Stroke  History:         Patient has prior history of Echocardiogram examinations. CAD,                  Prosthetic  AV and Aortic Valve Disease; Arrythmias:Atrial                  Fibrillation.  Sonographer:     Alyse Low Roar Referring Phys:  [place] Diagnosing Phys: Ida Rogue MD IMPRESSIONS  1. Left ventricular ejection fraction, by estimation, is 50 to 55%. The left ventricle has low normal function. The left ventricle has no regional wall motion abnormalities. There is moderate left ventricular hypertrophy. Left ventricular diastolic parameters are indeterminate.  2. Right ventricular systolic function is mildly reduced. The right ventricular size is normal. There is normal  pulmonary artery systolic pressure. The estimated right ventricular systolic pressure is 0000000 mmHg.  3. Left atrial size was mild to moderately dilated.  4. The mitral valve is normal in structure. Mild to moderate mitral valve regurgitation. No evidence of mitral stenosis.  5. Tricuspid valve regurgitation is mild to moderate.  6. The aortic valve has been repaired/replaced. Aortic valve regurgitation is mild. Aortic valve sclerosis is present, with no evidence of aortic valve stenosis. Aortic valve mean gradient measures 7.0 mmHg.  7. The inferior vena cava is normal in size with greater than 50% respiratory variability, suggesting right atrial pressure of 3 mmHg.  8. Agitated saline contrast bubble study was negative, with no evidence of any interatrial shunt. FINDINGS  Left Ventricle: Left ventricular ejection fraction, by estimation, is 50 to 55%. The left ventricle has low normal function. The left ventricle has no regional wall motion abnormalities. The left ventricular internal cavity size was normal in size. There is moderate left ventricular hypertrophy. Left ventricular diastolic parameters are indeterminate. Right Ventricle: The right ventricular size is normal. No increase in right ventricular wall thickness. Right ventricular systolic function is mildly reduced. There is normal pulmonary artery systolic pressure. The tricuspid  regurgitant velocity is 2.28 m/s, and with an assumed right atrial pressure of 5 mmHg, the estimated right ventricular systolic pressure is 0000000 mmHg. Left Atrium: Left atrial size was mild to moderately dilated. Right Atrium: Right atrial size was normal in size. Pericardium: There is no evidence of pericardial effusion. Mitral Valve: The mitral valve is normal in structure. Mild to moderate mitral valve regurgitation. No evidence of mitral valve stenosis. Tricuspid Valve: The tricuspid valve is normal in structure. Tricuspid valve regurgitation is mild to moderate. No evidence of tricuspid stenosis. Aortic Valve: The aortic valve has been repaired/replaced. Aortic valve regurgitation is mild. Aortic valve sclerosis is present, with no evidence of aortic valve stenosis. Aortic valve mean gradient measures 7.0 mmHg. Aortic valve peak gradient measures  13.2 mmHg. Aortic valve area, by VTI measures 1.58 cm. Pulmonic Valve: The pulmonic valve was normal in structure. Pulmonic valve regurgitation is not visualized. No evidence of pulmonic stenosis. Aorta: The aortic root is normal in size and structure. Venous: The inferior vena cava is normal in size with greater than 50% respiratory variability, suggesting right atrial pressure of 3 mmHg. IAS/Shunts: No atrial level shunt detected by color flow Doppler. Agitated saline contrast was given intravenously to evaluate for intracardiac shunting. Agitated saline contrast bubble study was negative, with no evidence of any interatrial shunt. Additional Comments: A device lead is visualized.  LEFT VENTRICLE PLAX 2D LVIDd:         5.58 cm   Diastology LVIDs:         4.16 cm   LV e' medial:    6.31 cm/s LV PW:         1.21 cm   LV E/e' medial:  16.2 LV IVS:        1.51 cm   LV e' lateral:   8.92 cm/s LVOT diam:     1.90 cm   LV E/e' lateral: 11.4 LV SV:         50 LV SV Index:   26 LVOT Area:     2.84 cm  RIGHT VENTRICLE RV Mid diam:    3.17 cm RV S prime:     11.30 cm/s LEFT  ATRIUM             Index  RIGHT ATRIUM           Index LA diam:        4.60 cm 2.34 cm/m   RA Area:     17.00 cm LA Vol (A2C):   71.8 ml 36.58 ml/m  RA Volume:   41.70 ml  21.24 ml/m LA Vol (A4C):   86.4 ml 44.02 ml/m LA Biplane Vol: 80.9 ml 41.21 ml/m  AORTIC VALVE                     PULMONIC VALVE AV Area (Vmax):    1.62 cm      PV Vmax:        1.10 m/s AV Area (Vmean):   1.60 cm      PV Peak grad:   4.8 mmHg AV Area (VTI):     1.58 cm      RVOT Peak grad: 2 mmHg AV Vmax:           182.00 cm/s AV Vmean:          120.000 cm/s AV VTI:            0.318 m AV Peak Grad:      13.2 mmHg AV Mean Grad:      7.0 mmHg LVOT Vmax:         104.00 cm/s LVOT Vmean:        67.800 cm/s LVOT VTI:          0.177 m LVOT/AV VTI ratio: 0.56  AORTA Ao Root diam: 2.90 cm Ao Asc diam:  3.10 cm MITRAL VALVE                TRICUSPID VALVE MV Area (PHT): 3.65 cm     TR Peak grad:   20.8 mmHg MV Decel Time: 208 msec     TR Vmax:        228.00 cm/s MV E velocity: 102.00 cm/s                             SHUNTS                             Systemic VTI:  0.18 m                             Systemic Diam: 1.90 cm Ida Rogue MD Electronically signed by Ida Rogue MD Signature Date/Time: 06/29/2021/1:22:54 PM    Final    CT HEAD CODE STROKE WO CONTRAST  Result Date: 06/28/2021 CLINICAL DATA:  Code stroke.  Neuro deficit, acute, stroke suspected EXAM: CT HEAD WITHOUT CONTRAST TECHNIQUE: Contiguous axial images were obtained from the base of the skull through the vertex without intravenous contrast. RADIATION DOSE REDUCTION: This exam was performed according to the departmental dose-optimization program which includes automated exposure control, adjustment of the mA and/or kV according to patient size and/or use of iterative reconstruction technique. COMPARISON:  CT head 11/18/2020. FINDINGS: Brain: Similar appearance of remote infarct in the right occipital lobe. No evidence of acute large vascular territory infarct.  Moderate to advanced patchy white matter hypoattenuation, nonspecific but compatible with chronic microvascular ischemic disease. Cerebral atrophy with ex vacuo ventricular dilation. No hydrocephalus. No acute hemorrhage, mass lesion, or midline shift. Vascular: No hyperdense vessel identified. Skull: No evidence of acute fracture. Sinuses/Orbits: Mild paranasal sinus mucosal thickening.  Unremarkable orbits. Other: No mastoid effusions. ASPECTS [facility] Stroke Program Early CT Score) Total score (0-10 with 10 being normal): 10. IMPRESSION: 1. No evidence of acute intracranial abnormality. ASPECTS is 10. 2. Remote right occipital infarct and moderate to advanced chronic microvascular ischemic disease. 3. Cerebral Atrophy (ICD10-G31.9). Code stroke imaging results were communicated on 06/28/2021 at 6:05 pm to provider [facility] via secure text paging. Electronically Signed   By: Margaretha Sheffield M.D.   On: 06/28/2021 18:07        Medications:    Scheduled Meds:  atorvastatin  40 mg Oral QHS   isosorbide mononitrate  15 mg Oral Daily   levothyroxine  25 mcg Oral Q0600   metoprolol succinate  25 mg Oral Daily   ranolazine  500 mg Oral BID   tamsulosin  0.4 mg Oral QHS   Continuous Infusions:  heparin 1,250 Units/hr (06/30/21 0630)       LOS: 0 days    Time spent: 35 minutes    Leslee Home, MD [facility]   To contact the attending provider between 7A-7P or the covering provider during after hours 7P-7A, please log into the web site [email or web address] and access using universal  password for that web site. If you do not have the password, please call the hospital operator.  06/30/2021, 11:36 AM

## 2021-06-30 NOTE — Progress Notes (Signed)
Physical Therapy Treatment Patient Details Name: Jorge Cherry MRN: [id number] DOB: 12-Nov-1935 Today's Date: 06/30/2021   History of Present Illness Pt is an 86 y.o. male presenting to hospital with acute onset of diplopia; R eye medial gaze palsy noted.  Pt admitted with diplopia, gait disturbance, slurred speech, dizziness, chest pain, CAD, CHF, a-fib, and htn.  Initial head CT negative but suspected CVA.  PMH includes CAD s/p CABG, a-fib on Eliquis, htn, HLD, CKD, aortic stenosis s/p TAVR, and pacemaker.    PT Comments    Pt resting in bed upon PT arrival; agreeable to PT session; pt's wife present during session.  Pt denying any pain during session but still reporting double vision (although a little better than yesterday).  SBA semi-supine to sitting edge of bed; SBA with transfers using RW; and CGA ambulating 200 feet with RW (pt noted with L lean and positioned more to L side of walker requiring vc's for upright posture and to stay in middle of walker--pt reports he can feel his L lean and tries to correct it; pt kept both his eyes open during ambulation today but occasionally closing R eye when navigating around objects); of note pt's L lean improved compared to yesterdays therapy session.  No loss of balance noted during sessions activities.  Will continue to focus on strengthening, balance, progressive ambulation, and trial stairs as appropriate during hospitalization.  Continue to recommend 24/7 assist with functional mobility for safety (discussed with pt and pt's wife).   Recommendations for follow up therapy are one component of a multi-disciplinary discharge planning process, led by the attending physician.  Recommendations may be updated based on patient status, additional functional criteria and insurance authorization.  Follow Up Recommendations  Home health PT     Assistance Recommended at Discharge Frequent or constant Supervision/Assistance  Patient can return home with the  following A little help with walking and/or transfers;A little help with bathing/dressing/bathroom;Assistance with cooking/housework;Assist for transportation;Help with stairs or ramp for entrance   Equipment Recommendations  Rolling walker (2 wheels);BSC/3in1    Recommendations for Other Services OT consult     Precautions / Restrictions Precautions Precautions: Fall Restrictions Weight Bearing Restrictions: No     Mobility  Bed Mobility Overal bed mobility: Needs Assistance Bed Mobility: Supine to Sit     Supine to sit: Supervision, HOB elevated     General bed mobility comments: mild increased effort to perform on own    Transfers Overall transfer level: Needs assistance Equipment used: Rolling walker (2 wheels) Transfers: Sit to/from Stand Sit to Stand: Supervision           General transfer comment: vc's for UE placement; x1 trial from bed and x1 trial from [facility] over toilet    Ambulation/Gait Ambulation/Gait assistance: Min guard Gait Distance (Feet):  (200 feet; 20 feet (bathroom to recliner)) Assistive device: Rolling walker (2 wheels)   Gait velocity: decreased     General Gait Details: mild L lean noted during ambulation; CGA for safety; vc's to stay closer to middle of walker (tends to be on L side of walker); decreased B LE step length   Stairs             Wheelchair Mobility    Modified Rankin (Stroke Patients Only)       Balance Overall balance assessment: Needs assistance Sitting-balance support: No upper extremity supported, Feet supported Sitting balance-Leahy Scale: Normal Sitting balance - Comments: steady sitting reaching outside BOS   Standing balance support: No  upper extremity supported Standing balance-Leahy Scale: Good Standing balance comment: steady standing managing underwear for toileting and washing hands at sink; tends to have mild L lean with ambulation (CGA for safety)                             Cognition Arousal/Alertness: Awake/alert Behavior During Therapy: WFL for tasks assessed/performed Overall Cognitive Status: Within Functional Limits for tasks assessed                                          Exercises      General Comments  Nursing cleared pt for participation in physical therapy.  Pt agreeable to PT session.       Pertinent Vitals/Pain Pain Assessment Pain Assessment: No/denies pain Vitals (HR and O2 on room air) stable and WFL throughout treatment session.    Home Living                          Prior Function            PT Goals (current goals can now be found in the care plan section) Acute Rehab PT Goals Patient Stated Goal: to go home PT Goal Formulation: With patient/family Time For Goal Achievement: 07/13/21 Potential to Achieve Goals: Good Progress towards PT goals: Progressing toward goals    Frequency    7X/week      PT Plan Current plan remains appropriate    Co-evaluation              AM-PAC PT "6 Clicks" Mobility   Outcome Measure  Help needed turning from your back to your side while in a flat bed without using bedrails?: None Help needed moving from lying on your back to sitting on the side of a flat bed without using bedrails?: None Help needed moving to and from a bed to a chair (including a wheelchair)?: A Little Help needed standing up from a chair using your arms (e.g., wheelchair or bedside chair)?: A Little Help needed to walk in hospital room?: A Little Help needed climbing 3-5 steps with a railing? : A Little 6 Click Score: 20    End of Session Equipment Utilized During Treatment: Gait belt Activity Tolerance: Patient tolerated treatment well Patient left: in chair;with call bell/phone within reach;with chair alarm set;with family/visitor present;Other (comment) (OT present for OT session) Nurse Communication: Mobility status;Precautions;Other (comment) (L lean with  ambulation) PT Visit Diagnosis: Other abnormalities of gait and mobility (R26.89)     Time: 5035-4656 PT Time Calculation (min) (ACUTE ONLY): 39 min  Charges:  $Gait Training: 8-22 mins $Therapeutic Activity: 23-37 mins                    Hendricks Limes, PT 06/30/21, 2:21 PM

## 2021-06-30 NOTE — Progress Notes (Signed)
Occupational Therapy Treatment Patient Details Name: Jorge Cherry MRN: [id number] DOB: [birth year] Today's Date: 06/30/2021   History of present illness Pt is an 86 y.o. male presenting to hospital with acute onset of diplopia; R eye medial gaze palsy noted.  Pt admitted with diplopia, gait disturbance, slurred speech, dizziness, chest pain, CAD, CHF, a-fib, and htn.  Initial head CT negative but suspected CVA.  PMH includes CAD s/p CABG, a-fib on Eliquis, htn, HLD, CKD, aortic stenosis s/p TAVR, and pacemaker.   OT comments  Pt seen for OT treatment on this date. Upon arrival to room, pt awake and seated upright in recliner completing PT session. Per PT, pt performed toilet transfer & clothing management requiring MIN GUARD only d/t decreased balance. At beginning of session, pt reported no pain and was agreeable to OT tx. Pt reported that he continues to experience diplopia, however was able to perform x3 standing grooming tasks and walk short household distances with RW, requiring only SUPERVISION-MIN GUARD d/t decreased balance. Once seated in recliner, pt was able to read clock across room, read words place within 12 inches of his face, and draw clock (with hour & minute hands placed at specified time) without any errors. Pt was also able to verbalized x1 compensatory strategy for reducing diplopia during functional tasks (e.g., closing one eye). Pt educated on continued use of R eye when performing seated functional tasks to support the re-development of visual performance skills. Pt is making good progress toward goals and continues to benefit from skilled OT services to maximize return to PLOF and minimize risk of future falls, injury, caregiver burden, and readmission. Will continue to follow POC. Discharge recommendation remains appropriate.     Recommendations for follow up therapy are one component of a multi-disciplinary discharge planning process, led by the attending physician.   Recommendations may be updated based on patient status, additional functional criteria and insurance authorization.    Follow Up Recommendations  Home health OT    Assistance Recommended at Discharge Frequent or constant Supervision/Assistance  Patient can return home with the following  A little help with walking and/or transfers;Assistance with cooking/housework;Direct supervision/assist for medications management;Assist for transportation;Help with stairs or ramp for entrance;A little help with bathing/dressing/bathroom   Equipment Recommendations  Tub/shower seat       Precautions / Restrictions Precautions Precautions: Fall Restrictions Weight Bearing Restrictions: No       Mobility Bed Mobility               General bed mobility comments: not assessed, pt in recliner pre/post session    Transfers Overall transfer level: Needs assistance Equipment used: Rolling walker (2 wheels) Transfers: Sit to/from Stand Sit to Stand: Supervision                 Balance Overall balance assessment: Needs assistance Sitting-balance support: No upper extremity supported, Feet supported Sitting balance-Leahy Scale: Normal Sitting balance - Comments: steady sitting reaching outside BOS   Standing balance support: No upper extremity supported, During functional activity Standing balance-Leahy Scale: Fair Standing balance comment: requires MIN GUARD for functional mobility with RW. Was noted to have x1 lateral LOB, however able to regain balance without physical assistance                           ADL either performed or assessed with clinical judgement   ADL Overall ADL's : Needs assistance/impaired     Grooming: Wash/dry hands;Wash/dry face;Oral care;Supervision/safety;Standing  Cognition Arousal/Alertness: Awake/alert Behavior During Therapy: WFL for tasks assessed/performed Overall Cognitive  Status: Within Functional Limits for tasks assessed                                          Exercises Other Exercises Other Exercises: Pt able to read clock across room and words place within 12 inches of his face. Pt able to draw clock and correct time without any errors noted. Pt able to verbalized x1 compensatory strategy for reducing diplopia during functional tasks (e.g., closing one eye). Pt educated on continued use of R eye when seated to support re-development of visual performance skills            Pertinent Vitals/ Pain       Pain Assessment Pain Assessment: No/denies pain         Frequency  Min 3X/week        Progress Toward Goals  OT Goals(current goals can now be found in the care plan section)  Progress towards OT goals: Progressing toward goals  Acute Rehab OT Goals Patient Stated Goal: to go home OT Goal Formulation: With patient Time For Goal Achievement: 07/13/21 Potential to Achieve Goals: Good  Plan Discharge plan remains appropriate;Frequency remains appropriate       AM-PAC OT "6 Clicks" Daily Activity     Outcome Measure   Help from another person eating meals?: None Help from another person taking care of personal grooming?: A Little Help from another person toileting, which includes using toliet, bedpan, or urinal?: A Little Help from another person bathing (including washing, rinsing, drying)?: A Little Help from another person to put on and taking off regular upper body clothing?: None Help from another person to put on and taking off regular lower body clothing?: None 6 Click Score: 21    End of Session Equipment Utilized During Treatment: Rolling walker (2 wheels)  OT Visit Diagnosis: Unsteadiness on feet (R26.81);Muscle weakness (generalized) (M62.81);Other symptoms and signs involving the nervous system (R29.898)   Activity Tolerance Patient tolerated treatment well   Patient Left in chair;with call bell/phone  within reach;with chair alarm set   Nurse Communication Mobility status        Time: 1400-1430 OT Time Calculation (min): 30 min  Charges: OT General Charges $OT Visit: 1 Visit OT Treatments $Self Care/Home Management : 23-37 mins  Matthew Folks, OTR/L [facility] [phone]

## 2021-07-01 ENCOUNTER — Telehealth: Payer: Self-pay | Admitting: Internal Medicine

## 2021-07-01 LAB — CBC
HCT: 37 % — ABNORMAL LOW (ref 39.0–52.0)
Hemoglobin: 11.9 g/dL — ABNORMAL LOW (ref 13.0–17.0)
MCH: 29.8 pg (ref 26.0–34.0)
MCHC: 32.2 g/dL (ref 30.0–36.0)
MCV: 92.5 fL (ref 80.0–100.0)
Platelets: 132 10*3/uL — ABNORMAL LOW (ref 150–400)
RBC: 4 MIL/uL — ABNORMAL LOW (ref 4.22–5.81)
RDW: 14.1 % (ref 11.5–15.5)
WBC: 5.4 10*3/uL (ref 4.0–10.5)
nRBC: 0 % (ref 0.0–0.2)

## 2021-07-01 MED ORDER — ISOSORBIDE MONONITRATE ER 30 MG PO TB24: 15.0000 mg | ORAL_TABLET | Freq: Every day | ORAL | 0 refills | Status: DC

## 2021-07-01 NOTE — TOC Initial Note (Signed)
Transition of Care [facility]) - Initial/Assessment Note    Patient Details  Name: Jorge Cherry MRN: [id number] Date of Birth: 08/04/1935  [facility] CM/SW Contact:    Pete Pelt, RN Phone Number: 07/01/2021, 10:52 AM  Clinical Narrative:    Patient lives at home with his wife.  He does not currently have home services but he accepts home health from [facility].  Patient states spouse drives, he has a PCP and is up to date with his appointments.  He states he has missed a medication and "that is why I am here".  Patient states he has a pill box, discussed setting an alarm or writing himself a note.  He states his wife is going to remind him as well to prevent this.  Recommended DME is rolling walker and 3 n 1, RNCM requested this from [employer] and will be delivered to patient's room.  Patient is amenable.                 Expected Discharge Plan: [place] Barriers to Discharge: Continued Medical Work up   Patient Goals and CMS Choice     Choice offered to / list presented to : NA  Expected Discharge Plan and Services Expected Discharge Plan: [place]   Discharge Planning Services: CM Consult Post Acute Care Choice: [place] arrangements for the past 2 months: Single Family Home                                      Prior Living Arrangements/Services Living arrangements for the past 2 months: Single Family Home Lives with:: Self, Spouse                   Activities of Daily Living Home Assistive Devices/Equipment: CPAP ADL Screening (condition at time of admission) Patient's cognitive ability adequate to safely complete daily activities?: No Is the patient deaf or have difficulty hearing?: Yes Does the patient have difficulty seeing, even when wearing glasses/contacts?: Yes Does the patient have difficulty concentrating, remembering, or making decisions?: No Patient able to express need for assistance  with ADLs?: No Does the patient have difficulty dressing or bathing?: No Independently performs ADLs?: Yes (appropriate for developmental age) Does the patient have difficulty walking or climbing stairs?: No Weakness of Legs: None Weakness of Arms/Hands: None  Permission Sought/Granted                  Emotional Assessment              Admission diagnosis:  Double vision [H53.2] Gaze palsy [H51.0] Acute CVA (cerebrovascular accident) [facility]) [I63.9] Patient Active Problem List   Diagnosis Date Noted   Acute CVA (cerebrovascular accident) ([place]) 06/30/2021   Gaze palsy    Double vision 06/28/2021   Slurred speech 06/28/2021   Gait disturbance 06/28/2021   Dizziness 06/28/2021   Pacemaker 03/04/2020   Typical atrial flutter ([place])    Persistent atrial fibrillation ([place]) 11/29/2019   Secondary hypercoagulable state ([place]) 11/29/2019   Syncope and collapse 11/20/2019   Heart block AV complete ([place]) 11/20/2019   Renal lesion 11/15/2019   S/P TAVR (transcatheter aortic valve replacement) 11/14/2019   Drug-induced hypothyroidism 01/10/2019   Bradycardia 03/03/2018   Chronic anticoagulation 12/14/2017   Anemia 11/25/2016   Chronic diastolic CHF (congestive heart failure) ([place]) 05/26/2016   Junctional (nodal) bradycardia 11/20/2015  Paroxysmal atrial fibrillation (H[facility]) 11/20/2015   Hyperlipidemia LDL goal <70 05/03/2015   Mild persistent asthma 03/19/2015   Gastroesophageal reflux disease 03/19/2015   Essential hypertension 02/07/2015   Chest pain 01/22/2015   Pain in the chest    Chronic renal insufficiency, stage III (moderate) ([place]) 12/28/2014   Lower GI bleed 09/24/2014   Diverticulosis of colon with hemorrhage 09/24/2014   Symptomatic anemia 09/24/2014   Severe aortic stenosis 07/22/2013   Hyperlipidemia with target LDL less than 70 10/27/2012   Hearing decreased 10/27/2012   Hx of CABG 10/27/2012   Coronary atherosclerosis of native coronary artery 10/27/2012    PCP:  Prince Solian, MD Pharmacy:   [id number] - [place], [place] [address] [place] [address] Phone: [phone] Fax: [phone]  [id number] [place], [place] - [address] [place] [address] Phone: [phone] Fax: [phone]     Social Determinants of Health (SDOH) Interventions    Readmission Risk Interventions No flowsheet data found.

## 2021-07-01 NOTE — Progress Notes (Signed)
Physical Therapy Treatment Patient Details Name: Jorge Cherry MRN: [id number] DOB: [birth year] Today's Date: 07/01/2021   History of Present Illness Pt is an 86 y.o. male presenting to hospital with acute onset of diplopia; R eye medial gaze palsy noted.  Pt admitted with diplopia, gait disturbance, slurred speech, dizziness, chest pain, CAD, CHF, a-fib, and htn.  Initial head CT negative but suspected CVA.  PMH includes CAD s/p CABG, a-fib on Eliquis, htn, HLD, CKD, aortic stenosis s/p TAVR, and pacemaker.    PT Comments    Pt was awake and alert resting in bed upon PT entrance into room. He reports his double vision has continued to improve and that "the clocks on the wall are at least touching today, compared to being very far apart before". Pt is able to perform bed mobility w/ SUPERVISION to sit EOB. Once seated EOB, he is able to perform sit to stand w/ SUPERVISION using RW. He is able to ambulate ~189ft to the stairs w/ CGA using RW, and is able to perform 6 stairs w/ CGA using R-sided railing. Verbal cueing was required to emphasis/educate to the patient to take his time going up the stairs and that "its not a race". After completing stairs he was able to ambulate ~163ft back to his room to return to recliner w/ all needs within reach. Pt will benefit from continued skilled PT to improve mobility, gait, and restore PLOF. Current discharge recommendation remains appropriate due to the level of assistance required by the patient to ensure safety and improve overall function.   Recommendations for follow up therapy are one component of a multi-disciplinary discharge planning process, led by the attending physician.  Recommendations may be updated based on patient status, additional functional criteria and insurance authorization.  Follow Up Recommendations  Home health PT     Assistance Recommended at Discharge Intermittent Supervision/Assistance  Patient can return home with the following A  little help with walking and/or transfers;A little help with bathing/dressing/bathroom;Assistance with cooking/housework;Assist for transportation;Help with stairs or ramp for entrance   Equipment Recommendations  Rolling walker (2 wheels);BSC/3in1    Recommendations for Other Services       Precautions / Restrictions Precautions Precautions: Fall Restrictions Weight Bearing Restrictions: No     Mobility  Bed Mobility Overal bed mobility: Needs Assistance Bed Mobility: Supine to Sit     Supine to sit: Supervision, HOB elevated          Transfers Overall transfer level: Needs assistance Equipment used: Rolling walker (2 wheels) Transfers: Sit to/from Stand Sit to Stand: Supervision           General transfer comment: Verbal cues for UE placement    Ambulation/Gait Ambulation/Gait assistance: Min guard Gait Distance (Feet): 200 Feet Assistive device: Rolling walker (2 wheels) Gait Pattern/deviations: Step-to pattern, Decreased step length - right, Decreased step length - left, Decreased stride length, Drifts right/left Gait velocity: decreased     General Gait Details: mild L lean w/ ambulation; Pt reports he knows he drifts to the left with walking, states it has occured more recently   Stairs Stairs: Yes Stairs assistance: Min guard Stair Management: One rail Right, Forwards, Alternating pattern Number of Stairs: 6     Wheelchair Mobility    Modified Rankin (Stroke Patients Only)       Balance Overall balance assessment: Needs assistance Sitting-balance support: No upper extremity supported, Feet supported Sitting balance-Leahy Scale: Normal     Standing balance support: During functional activity, Bilateral upper  extremity supported, Reliant on assistive device for balance Standing balance-Leahy Scale: Good                              Cognition Arousal/Alertness: Awake/alert Behavior During Therapy: WFL for tasks  assessed/performed Overall Cognitive Status: Within Functional Limits for tasks assessed                                          Exercises      General Comments        Pertinent Vitals/Pain Pain Assessment Pain Assessment: No/denies pain    Home Living                          Prior Function            PT Goals (current goals can now be found in the care plan section) Progress towards PT goals: Progressing toward goals    Frequency    7X/week      PT Plan Current plan remains appropriate    Co-evaluation              AM-PAC PT "6 Clicks" Mobility   Outcome Measure  Help needed turning from your back to your side while in a flat bed without using bedrails?: None Help needed moving from lying on your back to sitting on the side of a flat bed without using bedrails?: None Help needed moving to and from a bed to a chair (including a wheelchair)?: A Little Help needed standing up from a chair using your arms (e.g., wheelchair or bedside chair)?: A Little Help needed to walk in hospital room?: A Little Help needed climbing 3-5 steps with a railing? : A Little 6 Click Score: 20    End of Session Equipment Utilized During Treatment: Gait belt Activity Tolerance: Patient tolerated treatment well Patient left: in chair;with call bell/phone within reach;with chair alarm set (w/ MD entering room) Nurse Communication: Mobility status PT Visit Diagnosis: Other abnormalities of gait and mobility (R26.89);Unsteadiness on feet (R26.81)     Time: [id number] PT Time Calculation (min) (ACUTE ONLY): 22 min  Charges:                         Jonnie Kind, SPT 07/01/2021, 10:51 AM

## 2021-07-01 NOTE — Progress Notes (Addendum)
Received page from wife about elevated blood pressure following discharge. At home, BP 179/96 on "unreliable old" blood pressure machine. During this time, patient was asymptomatic. His amlodipine 2.5 had been discontinued, and his isosorbide was decreased from 120mg  qam / 30mg  qpm, now 15 mg daily. BP was in normal range on lower regimen in the hospital.  Recommended patient get a new BP cuff, and measure his BP twice daily for the next week. If remained elevated with SBP >170, recommended they call their PCP or cardiologist for an earlier appointment.  Strict return precautions provided to the emergency department.  Lorenda Cahill, MD Cardiology

## 2021-07-01 NOTE — Plan of Care (Signed)
°  Problem: Education: Goal: Knowledge of disease or condition will improve Outcome: Progressing   Problem: Clinical Measurements: Goal: Ability to maintain clinical measurements within normal limits will improve Outcome: Progressing Goal: Will remain free from infection Outcome: Progressing Goal: Diagnostic test results will improve Outcome: Progressing Goal: Respiratory complications will improve Outcome: Progressing Goal: Cardiovascular complication will be avoided Outcome: Progressing   Problem: Activity: Goal: Risk for activity intolerance will decrease Outcome: Progressing   Problem: Nutrition: Goal: Adequate nutrition will be maintained Outcome: Progressing   Problem: Coping: Goal: Level of anxiety will decrease Outcome: Progressing   Problem: Elimination: Goal: Will not experience complications related to bowel motility Outcome: Progressing Goal: Will not experience complications related to urinary retention Outcome: Progressing   Problem: Pain Managment: Goal: General experience of comfort will improve Outcome: Progressing   Problem: Safety: Goal: Ability to remain free from injury will improve Outcome: Progressing   Problem: Skin Integrity: Goal: Risk for impaired skin integrity will decrease Outcome: Progressing

## 2021-07-01 NOTE — Discharge Summary (Signed)
Physician Discharge Summary   Patient: Jorge Cherry MRN: [id number] DOB: November 29, 1935  Admit date:     06/28/2021  Discharge date: 07/01/21  Discharge Physician: Lorella Nimrod   PCP: Prince Solian, MD   Recommendations at discharge:  Please obtain CBC and BMP in 1 week Follow-up with primary care provider in 1 week Follow-up with neurology Follow-up with ophthalmology  Discharge Diagnoses: Principal Problem:   Double vision Active Problems:   Coronary atherosclerosis of native coronary artery   Chronic renal insufficiency, stage III (moderate) (HCC)   Chest pain   Essential hypertension   Paroxysmal atrial fibrillation (HCC)   Chronic diastolic CHF (congestive heart failure) ([place])   Anemia   Drug-induced hypothyroidism   Slurred speech   Gait disturbance   Dizziness   Gaze palsy   Acute CVA (cerebrovascular accident) [facility])   Hospital Course: Jorge Cherry is a 86 y.o. male with a PMH significant for CAD, CKD, Afib. This patient presented from home to the ED on 06/28/2021 with acute onset visual changes. He states he got dizzy and had double vision.  In the ED, it was found that they had EOM abnormalities. Initial head imaging was negative, repeat CT head after 48 hours also remained negative for any new findings.  CTA was negative for any large vessel occlusion, did show eccentric bulky calcified plaque with associated stenosis of 50 to 60% and left ICA and an atheromatous change causing associated stenosis of 40% in right ICA. An irregular fusiform aneurysm arising from distal left PICA was also seen, measuring 3 mm in maximum diameter.  Diffuse tortuosity suggesting of chronic underlying hypertension. Patient had a pacemaker which was not compatible with MRI. There was some concern of embolic phenomena as he missed few Eliquis doses.  He received heparin infusion initially which was switched to his home dose of Eliquis.  Patient's symptoms started improving.  He was  discharged with instructions to continue with Eliquis regularly to avoid more embolic strokes or deficits. If continue to have some visual difficulties he will need a ophthalmologic evaluation.  Patient has an history of coronary artery disease s/p CABG, HFpEF, hypertension, severe aortic stenosis s/p TAVR, complete heart block s/p pacemaker and A-fib.  Remained stable for those chronic conditions and will continue his home medications.  We increased the dose of home atorvastatin.  He will continue the rest of his home medications and follow-up with his providers.   Consultants: Neurology Procedures performed: None Disposition: Home Diet recommendation:  Discharge Diet Orders (From admission, onward)     Start     Ordered   07/01/21 0000  Diet - low sodium heart healthy        07/01/21 1108           Cardiac diet  DISCHARGE MEDICATION: Allergies as of 07/01/2021       Reactions   Altace [ramipril] Swelling, Other (See Comments)   Mouth swelling   Mucinex [guaifenesin Er] Hives, Swelling, Other (See Comments)   Mouth swelling   Azithromycin    Other reaction(s): Does not work   Contrast Media [iodinated Contrast Media] Other (See Comments)   Made eyes change each time        Medication List     STOP taking these medications    amLODipine 2.5 MG tablet Commonly known as: NORVASC       TAKE these medications    albuterol 108 (90 Base) MCG/ACT inhaler Commonly known as: VENTOLIN HFA 2 inhalations every 4 -  6 hours as needed   atorvastatin 20 MG tablet Commonly known as: LIPITOR Take 1 tablet (20 mg total) by mouth at bedtime.   Centrum Silver 50+Men Tabs Take 1 tablet by mouth at bedtime.   Dymista 137-50 MCG/ACT Susp Generic drug: Azelastine-Fluticasone Place 1-2 sprays into both nostrils daily as needed.   Eliquis 5 MG Tabs tablet Generic drug: apixaban TAKE 1 TABLET BY MOUTH TWICE A DAY   ezetimibe 10 MG tablet Commonly known as: ZETIA TAKE 1  TABLET BY MOUTH EVERY DAY   fenofibrate 145 MG tablet Commonly known as: TRICOR TAKE 1/2 TABLETS BY MOUTH DAILY.   Fish Oil 1000 MG Caps Take 1,000 mg by mouth 2 (two) times daily.   fluticasone 50 MCG/ACT nasal spray Commonly known as: FLONASE Place 2 sprays into both nostrils daily as needed for allergies or rhinitis.   furosemide 40 MG tablet Commonly known as: LASIX Take 1 tablet (40 mg) in the morning. You may take an extra 1/2 tablet (20 mg) in the evening as needed for swelling.   ipratropium 0.03 % nasal spray Commonly known as: ATROVENT Place 2 sprays into both nostrils 2 (two) times daily.   isosorbide mononitrate 30 MG 24 hr tablet Commonly known as: IMDUR Take 0.5 tablets (15 mg total) by mouth daily. Start taking on: July 02, 2021 What changed:  medication strength how much to take how to take this when to take this additional instructions   levothyroxine 25 MCG tablet Commonly known as: SYNTHROID Take 25 mcg by mouth daily before breakfast.   loratadine 10 MG tablet Commonly known as: CLARITIN Take 1 tablet (10 mg total) by mouth 2 (two) times daily as needed for allergies (Can use an extra dose during flares).   metoprolol succinate 50 MG 24 hr tablet Commonly known as: TOPROL-XL TAKE 0.5 TABLETS (25 MG TOTAL) BY MOUTH DAILY. TAKE WITH OR IMMEDIATELY FOLLOWING A MEAL.   nitroGLYCERIN 0.4 MG/SPRAY spray Commonly known as: NITROLINGUAL PLACE 1 SPRAY UNDER THE TONGUE EVERY 5 (FIVE) MINUTES X 3 DOSES AS NEEDED FOR CHEST PAIN.   pantoprazole 40 MG tablet Commonly known as: PROTONIX Take 1 tablet (40 mg total) by mouth daily.   ranolazine 500 MG 12 hr tablet Commonly known as: RANEXA TAKE 1 TABLET BY MOUTH TWICE A DAY   tamsulosin 0.4 MG Caps capsule Commonly known as: FLOMAX Take 0.4 mg by mouth at bedtime.   vitamin C 500 MG tablet Commonly known as: ASCORBIC ACID Take 500 mg by mouth at bedtime.   vitamin E 180 MG (400 UNITS)  capsule Take 400 Units by mouth daily.        Follow-up Information     Avva, Ravisankar, MD. Schedule an appointment as soon as possible for a visit in 1 week(s).   Specialty: Internal Medicine Contact information: [place] [address] [phone]         Troy Sine, MD .   Specialty: Cardiology Contact information: [address] [place] [address] [phone]         Deboraha Sprang, MD .   Specialty: Cardiology Contact information: [place] [address] [phone]                 Discharge Exam: Danley Danker Weights   06/28/21 1635 06/28/21 2334  Weight: 88.5 kg 82.5 kg   General.  Well-developed elderly man, in no acute distress. Pulmonary.  Lungs clear bilaterally, normal respiratory effort.  CV.  Regular rate and rhythm, no JVD, rub or murmur. Abdomen.  Soft, nontender, nondistended, BS positive. CNS.  Alert and oriented .  No focal neurologic deficit. Extremities.  No edema, no cyanosis, pulses intact and symmetrical. Psychiatry.  Judgment and insight appears normal.   Condition at discharge: stable  The results of significant diagnostics from this hospitalization (including imaging, microbiology, ancillary and laboratory) are listed below for reference.   Imaging Studies: CT ANGIO HEAD NECK W WO CM  Result Date: 06/29/2021 CLINICAL DATA:  Initial evaluation for neuro deficit, stroke suspected. EXAM: CT ANGIOGRAPHY HEAD AND NECK TECHNIQUE: Multidetector CT imaging of the head and neck was performed using the standard protocol during bolus administration of intravenous contrast. Multiplanar CT image reconstructions and MIPs were obtained to evaluate the vascular anatomy. Carotid stenosis measurements (when applicable) are obtained utilizing NASCET criteria, using the distal internal carotid diameter as the denominator. RADIATION DOSE REDUCTION: This exam was performed  according to the departmental dose-optimization program which includes automated exposure control, adjustment of the mA and/or kV according to patient size and/or use of iterative reconstruction technique. CONTRAST:  2mL OMNIPAQUE IOHEXOL 350 MG/ML SOLN COMPARISON:  Head CT from earlier the same day. FINDINGS: CTA NECK FINDINGS Aortic arch: Visualized aortic arch normal in caliber with normal branch pattern. Moderate atheromatous change seen about the arch itself. No high-grade stenosis about the origin the great vessels. Right carotid system: Right CCA patent without stenosis. Scattered calcified plaque about the right carotid bulb/proximal right ICA with associated stenosis of up to 40% by NASCET criteria. Right ICA mildly tortuous but widely patent distally without stenosis or dissection. Left carotid system: Left CCA patent from its origin to the bifurcation without stenosis. Eccentric bulky calcified plaque about the left carotid bulb/proximal left ICA with associated stenosis of up to 50-60% stenosis by NASCET criteria. Left ICA tortuous but widely patent distally without stenosis or dissection. Vertebral arteries: Both vertebral arteries arise from the subclavian arteries. Vertebral arteries mildly tortuous but are widely patent without stenosis or dissection. Skeleton: No discrete or worrisome osseous lesions. Advanced spondylosis present at C5-6. Patient is edentulous. Other neck: No other acute soft tissue abnormality within the neck. Upper chest: Visualized upper chest demonstrates no acute finding. Left-sided pacemaker/AICD noted. Review of the MIP images confirms the above findings CTA HEAD FINDINGS Anterior circulation: Petrous segments patent bilaterally. Atheromatous change within the carotid siphons with no more than mild multifocal narrowing. A1 segments patent bilaterally. Right A1 hypoplastic. Normal anterior communicating artery complex. Anterior cerebral arteries patent without stenosis. No M1  stenosis or occlusion. Normal MCA bifurcations. Distal MCA branches perfused and symmetric. Posterior circulation: Atheromatous change within the proximal V4 segments bilaterally with associated mild stenosis on the right. Both PICA patent at their origins. There is a fusiform aneurysm measuring up to 3 mm in maximal transverse diameter involving the distal left PICA (series 9, image 250). Additional smaller focal aneurysm noted slightly distally as well (series 10, image 148). Basilar patent to its distal aspect without stenosis. Superior cerebellar arteries patent bilaterally. Both PCAs primarily supplied via the basilar well perfused or distal aspects. Venous sinuses: Patent allowing for timing the contrast bolus. Anatomic variants: None significant. Review of the MIP images confirms the above findings IMPRESSION: 1. Negative CTA for large vessel occlusion. 2. Eccentric bulky calcified plaque about the left carotid bulb/proximal left ICA with associated stenosis of up to 50-60% by NASCET criteria. 3. Atheromatous change about the right carotid bulb/proximal right ICA with associated  stenosis of up to 40% by NASCET criteria. 4. Irregular fusiform aneurysm arising from the distal left PICA as above, measuring 3 mm in maximal diameter. 5. Diffuse tortuosity of the major arterial vasculature of the head and neck, suggesting chronic underlying hypertension. Electronically Signed   By: Jeannine Boga M.D.   On: 06/29/2021 00:03   CT HEAD WO CONTRAST (5MM)  Result Date: 06/30/2021 CLINICAL DATA:  Diplopia EXAM: CT HEAD WITHOUT CONTRAST TECHNIQUE: Contiguous axial images were obtained from the base of the skull through the vertex without intravenous contrast. RADIATION DOSE REDUCTION: This exam was performed according to the departmental dose-optimization program which includes automated exposure control, adjustment of the mA and/or kV according to patient size and/or use of iterative reconstruction technique.  COMPARISON:  Previous studies including the examination of 06/29/2021 FINDINGS: Brain: No acute intracranial findings are seen. Old infarct in the right occipital lobe has not changed. Cortical sulci are prominent. There is decreased density in the periventricular and subcortical white matter. Vascular: There are scattered arterial calcifications. Skull: Unremarkable. Sinuses/Orbits: There is mild mucosal thickening in the ethmoid sinus. Other: None IMPRESSION: No acute intracranial findings are seen in noncontrast CT brain. Old infarct is seen in the right occipital lobe with no significant change. Atrophy. Small-vessel disease. Mild chronic ethmoid sinusitis. Electronically Signed   By: Elmer Picker M.D.   On: 06/30/2021 18:14   CT HEAD WO CONTRAST (5MM)  Result Date: 06/29/2021 CLINICAL DATA:  Stroke, follow-up. Isolated right medial rectus palsy. Diplopia. EXAM: CT HEAD WITHOUT CONTRAST TECHNIQUE: Contiguous axial images were obtained from the base of the skull through the vertex without intravenous contrast. RADIATION DOSE REDUCTION: This exam was performed according to the departmental dose-optimization program which includes automated exposure control, adjustment of the mA and/or kV according to patient size and/or use of iterative reconstruction technique. COMPARISON:  CT studies done yesterday. FINDINGS: Brain: No change or acute finding by CT. No abnormality seen affecting the brainstem or cerebellum. There is old infarction in the right occipital lobe. There are extensive chronic small-vessel ischemic changes of the hemispheric white matter. No mass, hemorrhage, hydrocephalus or extra-axial collection. Vascular: There is atherosclerotic calcification of the major vessels at the base of the brain. Skull: Negative Sinuses/Orbits: Clear/normal Other: None IMPRESSION: No change or acute CT finding. Old right occipital infarction. Extensive chronic small-vessel ischemic changes of the hemispheric  white matter. Electronically Signed   By: Nelson Chimes M.D.   On: 06/29/2021 18:10   ECHOCARDIOGRAM COMPLETE BUBBLE STUDY  Result Date: 06/29/2021    ECHOCARDIOGRAM REPORT   Patient Name:   Jorge Cherry Date of Exam: 06/29/2021 Medical Rec #:  [id number]    Height:       68.0 in Accession #:    [id number]   Weight:       181.9 lb Date of Birth:  [birth year]    BSA:          1.963 m Patient Age:    33 years     BP:           176/94 mmHg Patient Gender: M            HR:           75 bpm. Exam Location:  [facility] Procedure: 2D Echo, Cardiac Doppler, Color Doppler and Saline Contrast Bubble            Study Indications:     Stroke  History:  Patient has prior history of Echocardiogram examinations. CAD,                  Prosthetic AV and Aortic Valve Disease; Arrythmias:Atrial                  Fibrillation.  Sonographer:     Alyse Low Roar Referring Phys:  [place] Diagnosing Phys: Ida Rogue MD IMPRESSIONS  1. Left ventricular ejection fraction, by estimation, is 50 to 55%. The left ventricle has low normal function. The left ventricle has no regional wall motion abnormalities. There is moderate left ventricular hypertrophy. Left ventricular diastolic parameters are indeterminate.  2. Right ventricular systolic function is mildly reduced. The right ventricular size is normal. There is normal pulmonary artery systolic pressure. The estimated right ventricular systolic pressure is 0000000 mmHg.  3. Left atrial size was mild to moderately dilated.  4. The mitral valve is normal in structure. Mild to moderate mitral valve regurgitation. No evidence of mitral stenosis.  5. Tricuspid valve regurgitation is mild to moderate.  6. The aortic valve has been repaired/replaced. Aortic valve regurgitation is mild. Aortic valve sclerosis is present, with no evidence of aortic valve stenosis. Aortic valve mean gradient measures 7.0 mmHg.  7. The inferior vena cava is normal in size with greater than 50% respiratory  variability, suggesting right atrial pressure of 3 mmHg.  8. Agitated saline contrast bubble study was negative, with no evidence of any interatrial shunt. FINDINGS  Left Ventricle: Left ventricular ejection fraction, by estimation, is 50 to 55%. The left ventricle has low normal function. The left ventricle has no regional wall motion abnormalities. The left ventricular internal cavity size was normal in size. There is moderate left ventricular hypertrophy. Left ventricular diastolic parameters are indeterminate. Right Ventricle: The right ventricular size is normal. No increase in right ventricular wall thickness. Right ventricular systolic function is mildly reduced. There is normal pulmonary artery systolic pressure. The tricuspid regurgitant velocity is 2.28 m/s, and with an assumed right atrial pressure of 5 mmHg, the estimated right ventricular systolic pressure is 0000000 mmHg. Left Atrium: Left atrial size was mild to moderately dilated. Right Atrium: Right atrial size was normal in size. Pericardium: There is no evidence of pericardial effusion. Mitral Valve: The mitral valve is normal in structure. Mild to moderate mitral valve regurgitation. No evidence of mitral valve stenosis. Tricuspid Valve: The tricuspid valve is normal in structure. Tricuspid valve regurgitation is mild to moderate. No evidence of tricuspid stenosis. Aortic Valve: The aortic valve has been repaired/replaced. Aortic valve regurgitation is mild. Aortic valve sclerosis is present, with no evidence of aortic valve stenosis. Aortic valve mean gradient measures 7.0 mmHg. Aortic valve peak gradient measures  13.2 mmHg. Aortic valve area, by VTI measures 1.58 cm. Pulmonic Valve: The pulmonic valve was normal in structure. Pulmonic valve regurgitation is not visualized. No evidence of pulmonic stenosis. Aorta: The aortic root is normal in size and structure. Venous: The inferior vena cava is normal in size with greater than 50% respiratory  variability, suggesting right atrial pressure of 3 mmHg. IAS/Shunts: No atrial level shunt detected by color flow Doppler. Agitated saline contrast was given intravenously to evaluate for intracardiac shunting. Agitated saline contrast bubble study was negative, with no evidence of any interatrial shunt. Additional Comments: A device lead is visualized.  LEFT VENTRICLE PLAX 2D LVIDd:         5.58 cm   Diastology LVIDs:  4.16 cm   LV e' medial:    6.31 cm/s LV PW:         1.21 cm   LV E/e' medial:  16.2 LV IVS:        1.51 cm   LV e' lateral:   8.92 cm/s LVOT diam:     1.90 cm   LV E/e' lateral: 11.4 LV SV:         50 LV SV Index:   26 LVOT Area:     2.84 cm  RIGHT VENTRICLE RV Mid diam:    3.17 cm RV S prime:     11.30 cm/s LEFT ATRIUM             Index        RIGHT ATRIUM           Index LA diam:        4.60 cm 2.34 cm/m   RA Area:     17.00 cm LA Vol (A2C):   71.8 ml 36.58 ml/m  RA Volume:   41.70 ml  21.24 ml/m LA Vol (A4C):   86.4 ml 44.02 ml/m LA Biplane Vol: 80.9 ml 41.21 ml/m  AORTIC VALVE                     PULMONIC VALVE AV Area (Vmax):    1.62 cm      PV Vmax:        1.10 m/s AV Area (Vmean):   1.60 cm      PV Peak grad:   4.8 mmHg AV Area (VTI):     1.58 cm      RVOT Peak grad: 2 mmHg AV Vmax:           182.00 cm/s AV Vmean:          120.000 cm/s AV VTI:            0.318 m AV Peak Grad:      13.2 mmHg AV Mean Grad:      7.0 mmHg LVOT Vmax:         104.00 cm/s LVOT Vmean:        67.800 cm/s LVOT VTI:          0.177 m LVOT/AV VTI ratio: 0.56  AORTA Ao Root diam: 2.90 cm Ao Asc diam:  3.10 cm MITRAL VALVE                TRICUSPID VALVE MV Area (PHT): 3.65 cm     TR Peak grad:   20.8 mmHg MV Decel Time: 208 msec     TR Vmax:        228.00 cm/s MV E velocity: 102.00 cm/s                             SHUNTS                             Systemic VTI:  0.18 m                             Systemic Diam: 1.90 cm Julien Nordmann MD Electronically signed by Julien Nordmann MD Signature Date/Time:  06/29/2021/1:22:54 PM    Final    CT HEAD CODE STROKE WO CONTRAST  Result Date: 06/28/2021 CLINICAL DATA:  Code stroke.  Neuro deficit, acute, stroke  suspected EXAM: CT HEAD WITHOUT CONTRAST TECHNIQUE: Contiguous axial images were obtained from the base of the skull through the vertex without intravenous contrast. RADIATION DOSE REDUCTION: This exam was performed according to the departmental dose-optimization program which includes automated exposure control, adjustment of the mA and/or kV according to patient size and/or use of iterative reconstruction technique. COMPARISON:  CT head 11/18/2020. FINDINGS: Brain: Similar appearance of remote infarct in the right occipital lobe. No evidence of acute large vascular territory infarct. Moderate to advanced patchy white matter hypoattenuation, nonspecific but compatible with chronic microvascular ischemic disease. Cerebral atrophy with ex vacuo ventricular dilation. No hydrocephalus. No acute hemorrhage, mass lesion, or midline shift. Vascular: No hyperdense vessel identified. Skull: No evidence of acute fracture. Sinuses/Orbits: Mild paranasal sinus mucosal thickening. Unremarkable orbits. Other: No mastoid effusions. ASPECTS [facility] Stroke Program Early CT Score) Total score (0-10 with 10 being normal): 10. IMPRESSION: 1. No evidence of acute intracranial abnormality. ASPECTS is 10. 2. Remote right occipital infarct and moderate to advanced chronic microvascular ischemic disease. 3. Cerebral Atrophy (ICD10-G31.9). Code stroke imaging results were communicated on 06/28/2021 at 6:05 pm to provider [facility] via secure text paging. Electronically Signed   By: Margaretha Sheffield M.D.   On: 06/28/2021 18:07    Microbiology: Results for orders placed or performed during the hospital encounter of 06/28/21  Resp Panel by RT-PCR (Flu A&B, Covid) Nasopharyngeal Swab     Status: None   Collection Time: 06/28/21  6:17 PM   Specimen: Nasopharyngeal Swab; Nasopharyngeal(NP)  swabs in vial transport medium  Result Value Ref Range Status   SARS Coronavirus 2 by RT PCR NEGATIVE NEGATIVE Final    Comment: (NOTE) SARS-CoV-2 target nucleic acids are NOT DETECTED.  The SARS-CoV-2 RNA is generally detectable in upper respiratory specimens during the acute phase of infection. The lowest concentration of SARS-CoV-2 viral copies this assay can detect is 138 copies/mL. A negative result does not preclude SARS-Cov-2 infection and should not be used as the sole basis for treatment or other patient management decisions. A negative result may occur with  improper specimen collection/handling, submission of specimen other than nasopharyngeal swab, presence of viral mutation(s) within the areas targeted by this assay, and inadequate number of viral copies(<138 copies/mL). A negative result must be combined with clinical observations, patient history, and epidemiological information. The expected result is Negative.  Fact Sheet for Patients:  EntrepreneurPulse.com.au  Fact Sheet for Healthcare Providers:  IncredibleEmployment.be  This test is no t yet approved or cleared by the [place] FDA and  has been authorized for detection and/or diagnosis of SARS-CoV-2 by FDA under an Emergency Use Authorization (EUA). This EUA will remain  in effect (meaning this test can be used) for the duration of the COVID-19 declaration under Section 564(b)(1) of the Act, 21 U.S.C.section 360bbb-3(b)(1), unless the authorization is terminated  or revoked sooner.       Influenza A by PCR NEGATIVE NEGATIVE Final   Influenza B by PCR NEGATIVE NEGATIVE Final    Comment: (NOTE) The Xpert Xpress SARS-CoV-2/FLU/RSV plus assay is intended as an aid in the diagnosis of influenza from Nasopharyngeal swab specimens and should not be used as a sole basis for treatment. Nasal washings and aspirates are unacceptable for Xpert Xpress  SARS-CoV-2/FLU/RSV testing.  Fact Sheet for Patients: EntrepreneurPulse.com.au  Fact Sheet for Healthcare Providers: IncredibleEmployment.be  This test is not yet approved or cleared by the [place] FDA and has been authorized for detection and/or diagnosis of SARS-CoV-2 by FDA under  an Emergency Use Authorization (EUA). This EUA will remain in effect (meaning this test can be used) for the duration of the COVID-19 declaration under Section 564(b)(1) of the Act, 21 U.S.C. section 360bbb-3(b)(1), unless the authorization is terminated or revoked.  Performed at [facility], [address] [place], [place] [address]     Labs: CBC: Recent Labs  Lab 06/28/21 1723 06/29/21 0406 06/30/21 0322 07/01/21 0416  WBC 8.3 7.8 6.8 5.4  NEUTROABS 6.3  --   --   --   HGB 12.5* 13.1 12.0* 11.9*  HCT 38.8* 40.3 37.3* 37.0*  MCV 92.2 91.2 91.6 92.5  PLT 160 161 145* [id number]*   Basic Metabolic Panel: Recent Labs  Lab 06/28/21 1723  NA 141  K 3.3*  CL 108  CO2 26  GLUCOSE 116*  BUN 26*  CREATININE 1.43*  CALCIUM 9.1   Liver Function Tests: Recent Labs  Lab 06/28/21 1723  AST 25  ALT 22  ALKPHOS 43  BILITOT 0.6  PROT 6.5  ALBUMIN 3.5   CBG: No results for input(s): GLUCAP in the last 168 hours.  Discharge time spent: greater than 30 minutes.  Signed: Lorella Nimrod, MD [facility] 07/01/2021

## 2021-07-08 DIAGNOSIS — N1831 Chronic kidney disease, stage 3a: Secondary | ICD-10-CM | POA: Diagnosis not present

## 2021-07-08 DIAGNOSIS — K869 Disease of pancreas, unspecified: Secondary | ICD-10-CM | POA: Diagnosis not present

## 2021-07-08 DIAGNOSIS — I480 Paroxysmal atrial fibrillation: Secondary | ICD-10-CM | POA: Diagnosis not present

## 2021-07-08 DIAGNOSIS — I25810 Atherosclerosis of coronary artery bypass graft(s) without angina pectoris: Secondary | ICD-10-CM | POA: Diagnosis not present

## 2021-07-08 DIAGNOSIS — E032 Hypothyroidism due to medicaments and other exogenous substances: Secondary | ICD-10-CM | POA: Diagnosis not present

## 2021-07-08 DIAGNOSIS — I5032 Chronic diastolic (congestive) heart failure: Secondary | ICD-10-CM | POA: Diagnosis not present

## 2021-07-08 DIAGNOSIS — I130 Hypertensive heart and chronic kidney disease with heart failure and stage 1 through stage 4 chronic kidney disease, or unspecified chronic kidney disease: Secondary | ICD-10-CM | POA: Diagnosis not present

## 2021-07-08 DIAGNOSIS — I350 Nonrheumatic aortic (valve) stenosis: Secondary | ICD-10-CM | POA: Diagnosis not present

## 2021-07-08 DIAGNOSIS — D692 Other nonthrombocytopenic purpura: Secondary | ICD-10-CM | POA: Diagnosis not present

## 2021-07-11 ENCOUNTER — Encounter (HOSPITAL_BASED_OUTPATIENT_CLINIC_OR_DEPARTMENT_OTHER): Payer: Self-pay | Admitting: Family

## 2021-07-11 ENCOUNTER — Ambulatory Visit (HOSPITAL_BASED_OUTPATIENT_CLINIC_OR_DEPARTMENT_OTHER): Payer: Medicare PPO | Admitting: Family

## 2021-07-11 ENCOUNTER — Other Ambulatory Visit: Payer: Self-pay

## 2021-07-11 VITALS — BP 170/80 | HR 70 | Ht 68.0 in | Wt 184.0 lb

## 2021-07-11 DIAGNOSIS — Z952 Presence of prosthetic heart valve: Secondary | ICD-10-CM

## 2021-07-11 DIAGNOSIS — Z8673 Personal history of transient ischemic attack (TIA), and cerebral infarction without residual deficits: Secondary | ICD-10-CM | POA: Diagnosis not present

## 2021-07-11 DIAGNOSIS — I25118 Atherosclerotic heart disease of native coronary artery with other forms of angina pectoris: Secondary | ICD-10-CM | POA: Diagnosis not present

## 2021-07-11 DIAGNOSIS — G4733 Obstructive sleep apnea (adult) (pediatric): Secondary | ICD-10-CM | POA: Diagnosis not present

## 2021-07-11 DIAGNOSIS — I10 Essential (primary) hypertension: Secondary | ICD-10-CM | POA: Diagnosis not present

## 2021-07-11 DIAGNOSIS — E785 Hyperlipidemia, unspecified: Secondary | ICD-10-CM | POA: Diagnosis not present

## 2021-07-11 DIAGNOSIS — N1831 Chronic kidney disease, stage 3a: Secondary | ICD-10-CM

## 2021-07-11 LAB — BASIC METABOLIC PANEL
BUN/Creatinine Ratio: 16 (ref 10–24)
BUN: 27 mg/dL (ref 8–27)
CO2: 21 mmol/L (ref 20–29)
Calcium: 9.6 mg/dL (ref 8.6–10.2)
Chloride: 112 mmol/L — ABNORMAL HIGH (ref 96–106)
Creatinine, Ser: 1.71 mg/dL — ABNORMAL HIGH (ref 0.76–1.27)
Glucose: 90 mg/dL (ref 70–99)
Potassium: 4 mmol/L (ref 3.5–5.2)
Sodium: 144 mmol/L (ref 134–144)
eGFR: 39 mL/min/{1.73_m2} — ABNORMAL LOW (ref >59)

## 2021-07-11 LAB — CBC
Hematocrit: 42 % (ref 37.5–51.0)
Hemoglobin: 13.7 g/dL (ref 13.0–17.7)
MCH: 29.9 pg (ref 26.6–33.0)
MCHC: 32.6 g/dL (ref 31.5–35.7)
MCV: 92 fL (ref 79–97)
Platelets: 146 10*3/uL — ABNORMAL LOW (ref 150–450)
RBC: 4.58 x10E6/uL (ref 4.14–5.80)
RDW: 13.7 % (ref 11.6–15.4)
WBC: 6.5 10*3/uL (ref 3.4–10.8)

## 2021-07-11 LAB — LIPID PANEL
Chol/HDL Ratio: 2.8 ratio (ref 0.0–5.0)
Cholesterol, Total: 108 mg/dL (ref 100–199)
HDL: 38 mg/dL — ABNORMAL LOW (ref >39)
LDL Chol Calc (NIH): 48 mg/dL (ref 0–99)
Triglycerides: 123 mg/dL (ref 0–149)
VLDL Cholesterol Cal: 22 mg/dL (ref 5–40)

## 2021-07-11 MED ORDER — AMLODIPINE BESYLATE 2.5 MG PO TABS: 2.5000 mg | ORAL_TABLET | Freq: Two times a day (BID) | ORAL | 1 refills | Status: DC

## 2021-07-11 NOTE — Progress Notes (Signed)
Office Visit    Patient Name: Jorge Cherry Date of Encounter: 07/11/2021  PCP:  Prince Solian, [facility]  Cardiologist:  Shelva Majestic, MD  Advanced Practice Provider:  No care team member to display Electrophysiologist:  Virl Axe, MD      Chief Complaint    Jorge Cherry is a 86 y.o. male with a hx of CAD s/p CABG, HFpEF, aortic stenosis s/p TAVR, CHB s/p PPM 11/2019, CKD, OSA, atrial fibrillation, CVA 06/2021  presents today for hospital follow up   Past Medical History    Past Medical History:  Diagnosis Date   Arthritis    "just a touch in my hands" (11/24/2016)   Asthma    Atherosclerosis of renal artery ([place])    RENAL DOPPLER, 12/10/2011 - Left renal artery demonstrated narrowing with elevated velocities consistent with a 1-59% diameter reduction   CKD (chronic kidney disease) stage 3, GFR 30-59 ml/min ([place])    "stable now since they backed off the water pills" (11/24/2016)   Coronary artery disease    a. 1994 s/p CABG x 4 (LIMA-LAD, VG->D2, VG->OM, VG->RCA); b. 02/2004 PCI SVG-D2 (3.5x16 Taxus DES). VG->RCA 100. Sev apical LAD dzs distal to LIMA insertion; c. 11/2016 Cath: LAD 95p/114m, D2 100ost, LCX 100ost, 70p/m, RCA 100p/m, RPDA fills via L->R collats. VG->RCA 100, VG->D2 20 ost, patent prox stent, 81m, LIMA->LAD ok, VG->OM3 20p. EF 55%-->Med Rx.   GERD (gastroesophageal reflux disease)    High cholesterol    History of lower GI bleeding    a. 09/2014 GIB due to diverticulosis/diverticulitis.   Iron deficiency anemia    Labile Hypertension    Moderate aortic stenosis    a. 10/2011 Echo:  EF >55%, mild-mod TR, mild-mod AS, mod Ca2+ of AoV leaflets; b. 07/2016 Echo: EF 60-65%, no rwma, Gr1 DD, mod AS [(S) mean grad 53mmHg, peak grad 43mmHg. Valve area (VTI): 1.33cm^2, (Vmax) 1.44cm^2. Mild MR]; c. 02/2018 Echo: EF 55-60%, no rwma, GR1 DD, Mod AS [Peak Vel (S): 319cm/s, Mean grad (S) 46mmHg, peak grad (S) 38mmHg].   PAF (paroxysmal atrial  fibrillation) (HCC)    a. CHA2DS2VASc = 4-->Eliquis.   S/P TAVR (transcatheter aortic valve replacement) 11/14/2019   s/p TAVR with a 26 mm Edwards S3U via the TF approach by Drs Angelena Form & Bartle   Sleep apnea    Past Surgical History:  Procedure Laterality Date   CARDIAC CATHETERIZATION  02/27/2004   Coronary intervention and medical management   CARDIAC CATHETERIZATION  11/24/2016   CARDIOVERSION N/A 12/20/2019   Procedure: CARDIOVERSION;  Surgeon: Skeet Latch, MD;  Location: [place];  Service: Cardiovascular;  Laterality: N/A;   CARDIOVERSION N/A 02/05/2020   Procedure: CARDIOVERSION;  Surgeon: Josue Hector, MD;  Location: [facility];  Service: Cardiovascular;  Laterality: N/A;   CATARACT EXTRACTION W/ INTRAOCULAR LENS IMPLANT Left    CORONARY ANGIOPLASTY  1994 X 2   "before bypass surgery"   CORONARY ANGIOPLASTY WITH STENT PLACEMENT  03/04/2004   SVG supplying the diagonal vessel stented with a 3.5x61mm Taxus stent post dilated to 4.0 mm   CORONARY ARTERY BYPASS GRAFT  1994   "CABG X4"   INGUINAL HERNIA REPAIR Right    PACEMAKER IMPLANT N/A 11/20/2019   Procedure: PACEMAKER IMPLANT;  Surgeon: Deboraha Sprang, MD;  Location: [place] [facility];  Service: Cardiovascular;  Laterality: N/A;   RIGHT HEART CATH AND CORONARY/GRAFT ANGIOGRAPHY N/A 11/24/2016   Procedure: Right Heart Cath and  Coronary/Graft Angiography;  Surgeon: Troy Sine, MD;  Location: [place] [facility];  Service: Cardiovascular;  Laterality: N/A;   RIGHT/LEFT HEART CATH AND CORONARY/GRAFT ANGIOGRAPHY N/A 10/18/2019   Procedure: RIGHT/LEFT HEART CATH AND CORONARY/GRAFT ANGIOGRAPHY;  Surgeon: Troy Sine, MD;  Location: [place] [facility];  Service: Cardiovascular;  Laterality: N/A;   TEE WITHOUT CARDIOVERSION N/A 11/14/2019   Procedure: TRANSESOPHAGEAL ECHOCARDIOGRAM (TEE);  Surgeon: Burnell Blanks, MD;  Location: [place] [facility];  Service: Open Heart Surgery;  Laterality: N/A;    TONSILLECTOMY AND ADENOIDECTOMY     TRANSCATHETER AORTIC VALVE REPLACEMENT, TRANSFEMORAL N/A 11/14/2019   Procedure: TRANSCATHETER AORTIC VALVE REPLACEMENT, TRANSFEMORAL;  Surgeon: Burnell Blanks, MD;  Location: [place] [facility];  Service: Open Heart Surgery;  Laterality: N/A;    Allergies  Allergies  Allergen Reactions   Altace [Ramipril] Swelling and Other (See Comments)    Mouth swelling   Mucinex [Guaifenesin Er] Hives, Swelling and Other (See Comments)    Mouth swelling   Azithromycin     Other reaction(s): Does not work   Contrast Media [Iodinated Contrast Media] Other (See Comments)    Made eyes change each time    History of Present Illness    Jorge Cherry is a 86 y.o. male with a hx of CAD s/p CABG, HFpEF, aortic stenosis s/p TAVR, CHB s/p PPM 11/2019, CKD, OSA, atrial fibrillation, CVA 06/2021 last seen while hospitalized.  He underwent CABG in 1994.  DES to graft supplying diagonal vessel in 2003. Most recent cardiac catheterization July 2018 with patent LIMA, patent vein graft to OM 3 and D2 occlusion to his RCA.  Normal LVEF at that time.  He has had multiple cardioversions with quick return of atrial fibrillation. June 2021 he had progressive AV stenosis and underwent TAVR with 24mm sapient 3 ultra TH VV via transfemoral approach. Due to prolonged pauses PPM was placed by Dr. Caryl Comes 11/2019. Deemed persistent long term atrial fibrillation and Amiodarone discontinued.  He was admitted 06/28/2021 after presenting with visual changes.  Initial head imaging negative and repeat CT head in 48 hours also negative.  CTA negative for any large vessel occlusion did show eccentric bulky calcified plaque with associated stenosis of 50 to 65% in the left ICA and atheromatous change causing associated stenosis of 40% in the right ICA.  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  His pacemaker was not compatible with MRI.   There was concern of embolic phenomena as he missed a few doses of Eliquis.  Echo during admission normal LVEF 50 to 55%, no RWMA, moderate LVH, mildly reduced RV SF, mild to moderate MR/TR, mild AI, negative bubble study.  Presents today for follow up with his wife. Echocardiogram performed during admission reviewed.  Notes that his right arm will go numb and tingly when he lays on it.  We discussed that this is likely a nerve issue.  He had upper extremity Doppler 05/2021 with no evidence of restriction.  Since discharge notes no chest pain, dyspnea, edema, orthopnea, PND.  Still with some vision changes and feels his eyes are on "2 different levels "making him feel off balance.  He is planning to start home health therapy soon.  EKGs/Labs/Other Studies Reviewed:   The following studies were reviewed today:  Echo bubble 06/29/21  1. Left ventricular ejection fraction, by estimation, is 50 to 55%. The  left ventricle has low normal  function. The left ventricle has no regional  wall motion abnormalities. There is moderate left ventricular hypertrophy.  Left ventricular diastolic  parameters are indeterminate.   2. Right ventricular systolic function is mildly reduced. The right  ventricular size is normal. There is normal pulmonary artery systolic  pressure. The estimated right ventricular systolic pressure is 0000000 mmHg.   3. Left atrial size was mild to moderately dilated.   4. The mitral valve is normal in structure. Mild to moderate mitral valve  regurgitation. No evidence of mitral stenosis.   5. Tricuspid valve regurgitation is mild to moderate.   6. The aortic valve has been repaired/replaced. Aortic valve  regurgitation is mild. Aortic valve sclerosis is present, with no evidence  of aortic valve stenosis. Aortic valve mean gradient measures 7.0 mmHg.   7. The inferior vena cava is normal in size with greater than 50%  respiratory variability, suggesting right atrial pressure of 3 mmHg.    8. Agitated saline contrast bubble study was negative, with no evidence  of any interatrial shunt.   EKG: No EKG today  Recent Labs: 06/28/2021: ALT 22; BUN 26; Creatinine, Ser 1.43; Potassium 3.3; Sodium 141 06/29/2021: TSH 1.671 07/01/2021: Hemoglobin 11.9; Platelets 132  Recent Lipid Panel    Component Value Date/Time   CHOL 136 06/28/2021 2349   CHOL 109 11/07/2020 0000   CHOL 137 04/09/2014 1115   TRIG 71 06/28/2021 2349   TRIG 182 (H) 04/09/2014 1115   HDL 43 06/28/2021 2349   HDL 39 (L) 11/07/2020 0000   HDL 32 (L) 04/09/2014 1115   CHOLHDL 3.2 06/28/2021 2349   VLDL 14 06/28/2021 2349   LDLCALC 79 06/28/2021 2349   LDLCALC 50 11/07/2020 0000   LDLCALC 69 04/09/2014 1115    Risk Assessment/Calculations:   CHA2DS2-VASc Score = 5   This indicates a 7.2% annual risk of stroke. The patient's score is based upon: CHF History: 1 HTN History: 1 Diabetes History: 0 Stroke History: 0 Vascular Disease History: 1 Age Score: 2 Gender Score: 0    Home Medications   Current Meds  Medication Sig   albuterol (VENTOLIN HFA) 108 (90 Base) MCG/ACT inhaler 2 inhalations every 4 - 6 hours as needed   amLODipine (NORVASC) 2.5 MG tablet Take 1 tablet (2.5 mg total) by mouth in the morning and at bedtime.   atorvastatin (LIPITOR) 20 MG tablet Take 1 tablet (20 mg total) by mouth at bedtime.   DYMISTA 137-50 MCG/ACT SUSP Place 1-2 sprays into both nostrils daily as needed.   ELIQUIS 5 MG TABS tablet TAKE 1 TABLET BY MOUTH TWICE A DAY   ezetimibe (ZETIA) 10 MG tablet TAKE 1 TABLET BY MOUTH EVERY DAY   fenofibrate (TRICOR) 145 MG tablet TAKE 1/2 TABLETS BY MOUTH DAILY.   fluticasone (FLONASE) 50 MCG/ACT nasal spray Place 2 sprays into both nostrils daily as needed for allergies or rhinitis.    furosemide (LASIX) 40 MG tablet Take 1 tablet (40 mg) in the morning. You may take an extra 1/2 tablet (20 mg) in the evening as needed for swelling.   ipratropium (ATROVENT) 0.03 % nasal  spray Place 2 sprays into both nostrils 2 (two) times daily.   isosorbide mononitrate (IMDUR) 30 MG 24 hr tablet Take 0.5 tablets (15 mg total) by mouth daily.   levothyroxine (SYNTHROID) 25 MCG tablet Take 25 mcg by mouth daily before breakfast.    loratadine (CLARITIN) 10 MG tablet Take 1 tablet (10 mg total) by mouth 2 (two)  times daily as needed for allergies (Can use an extra dose during flares).   metoprolol succinate (TOPROL-XL) 50 MG 24 hr tablet TAKE 0.5 TABLETS (25 MG TOTAL) BY MOUTH DAILY. TAKE WITH OR IMMEDIATELY FOLLOWING A MEAL.   Multiple Vitamins-Minerals (CENTRUM SILVER 50+MEN) TABS Take 1 tablet by mouth at bedtime.   nitroGLYCERIN (NITROLINGUAL) 0.4 MG/SPRAY spray PLACE 1 SPRAY UNDER THE TONGUE EVERY 5 (FIVE) MINUTES X 3 DOSES AS NEEDED FOR CHEST PAIN.   Omega-3 Fatty Acids (FISH OIL) 1000 MG CAPS Take 1,000 mg by mouth 2 (two) times daily.    pantoprazole (PROTONIX) 40 MG tablet Take 1 tablet (40 mg total) by mouth daily.   ranolazine (RANEXA) 500 MG 12 hr tablet TAKE 1 TABLET BY MOUTH TWICE A DAY   tamsulosin (FLOMAX) 0.4 MG CAPS capsule Take 0.4 mg by mouth at bedtime.   vitamin C (ASCORBIC ACID) 500 MG tablet Take 500 mg by mouth at bedtime.    vitamin E 400 UNIT capsule Take 400 Units by mouth daily.    Review of Systems      All other systems reviewed and are otherwise negative except as noted above.  Physical Exam    VS:  BP (!) 170/80 (BP Location: Left Arm, Patient Position: Sitting, Cuff Size: Normal)    Pulse 70    Ht 5\' 8"  (1.727 m)    Wt 184 lb (83.5 kg)    SpO2 95%    BMI 27.98 kg/m  , BMI Body mass index is 27.98 kg/m.  Wt Readings from Last 3 Encounters:  07/11/21 184 lb (83.5 kg)  06/28/21 181 lb 14.1 oz (82.5 kg)  04/23/21 190 lb 12.8 oz (86.5 kg)     GEN: Well nourished, well developed, in no acute distress. HEENT: normal. Neck: Supple, no JVD, carotid bruits, or masses. Cardiac:IRIR, no murmurs, rubs, or gallops. No clubbing, cyanosis, edema.   Radials/PT 2+ and equal bilaterally.  Respiratory:  Respirations regular and unlabored, clear to auscultation bilaterally. GI: Soft, nontender, nondistended. MS: No deformity or atrophy. Skin: Warm and dry, no rash. Neuro:  Strength and sensation are intact. Psych: Normal affect.  Assessment & Plan    CAD s/p CABG and DES to graft - Stable with no anginal symptoms. No indication for ischemic evaluation.  GDMT includes Metoprolol, Atorvastatin, Ranexa, Imdur. No aspirin due to chronic anticoagulation.  Imdur dose was reduced during recent admission due to hypotension.  BP management, as detailed below.  HTN -he was hypotensive while admitted and amlodipine was discontinued and Imdur reduced to 50 mg daily.  Since hospital discharge home SBP 170s. Resume Amlodipine at previous dosing of 2.5mg  BID. He will monitor BP daily and report next week. If BP remains above goal of 130/80 plan to increase dose of Imdur.  HLD, LDL goal <70 - 06/28/21 LDL 79.  This was nonfasting.  Previous LDL 10/2020 of 50.  Repeat lipid panel today.  If LDL above goal, plan to increase atorvastatin.  Chronic atrial fibrillation / Chronic anticoagulation -unaware of his atrial fibrillation.  Continue metoprolol 25 mg daily.  Continue Eliquis 5 mg twice daily. Does not meet dose reduction criteria but will require careful monitoring of renal function.  Denies bleeding complications. CHA2DS2-VASc Score = 5 [CHF History: 1, HTN History: 1, Diabetes History: 0, Stroke History: 0, Vascular Disease History: 1, Age Score: 2, Gender Score: 0].  Therefore, the patient's annual risk of stroke is 7.2 %.     OSA - CPAP compliance encouraged.  S/p PPM - Follows with Dr. Caryl Comes of EP.   CKDIII - Careful titration of diuretic and antihypertensive.  Careful monitoring of Eliquis dosing. BMP today for monitoring.   Hx of CVA - Recent admission with suspected CVA in setting of missed dose Eliquis. 06/2021 A1c 5.5, LDL 70 (not fasting). Still  with visual defects, establishing with [facility] PT/OT soon. GDMT includes Eliquis, Atorvastatin. Recommend follow up with neurology and opthamology. CBC and BMP today  for monitoring as recommended for repeat 1 week postdischarge and his PCP office is temporarily closed due to water damage.  Disposition: Follow up in 3 week(s) as scheduled with Shelva Majestic, MD or APP.  Signed, Loel Dubonnet, NP 07/11/2021, 9:17 AM [place]

## 2021-07-11 NOTE — Patient Instructions (Addendum)
Medication Instructions:  ?Your physician has recommended you make the following change in your medication:  ? ?RESUME Amlodipine 2.5mg  twice daily ? ?*If you need a refill on your cardiac medications before your next appointment, please call your pharmacy* ? ? ?Lab Work: ?Your physician recommends that you return for lab work today: CBC, BMP, cholesterol panel ? ?If you have labs (blood work) drawn today and your tests are completely normal, you will receive your results only by: ?MyChart Message (if you have MyChart) OR ?A paper copy in the mail ?If you have any lab test that is abnormal or we need to change your treatment, we will call you to review the results. ? ? ?Testing/Procedures: ?Your echocardiogram in the hospital looked stable compared to previous. It showed there was no evidence of PFO which is a good result.  ? ? ?Follow-Up: ?At [facility], you and your health needs are our priority.  As part of our continuing mission to provide you with exceptional heart care, we have created designated Provider Care Teams.  These Care Teams include your primary Cardiologist (physician) and Advanced Practice Providers (APPs -  Physician Assistants and Nurse Practitioners) who all work together to provide you with the care you need, when you need it. ? ?We recommend signing up for the patient portal called "MyChart".  Sign up information is provided on this After Visit Summary.  MyChart is used to connect with patients for Virtual Visits (Telemedicine).  Patients are able to view lab/test results, encounter notes, upcoming appointments, etc.  Non-urgent messages can be sent to your provider as well.   ?To learn more about what you can do with MyChart, go to [email or web address].   ? ?Your next appointment:   ?As scheduled with Dr. Claiborne Billings ? ? ?Other Instructions ? ?Tips to Measure your Blood Pressure Correctly ? ?Check blood pressure once per day at least 2 hours after your medications. Our goal is for your blood  pressure to be less than 130/80. ? ?Call us Thursday of next week to report your blood pressure readings.  ? ?Here's what you can do to ensure a correct reading: ? Don't drink a caffeinated beverage or smoke during the 30 minutes before the test. ? Sit quietly for five minutes before the test begins. ? During the measurement, sit in a chair with your feet on the floor and your arm supported so your elbow is at about heart level. ? The inflatable part of the cuff should completely cover at least 80% of your upper arm, and the cuff should be placed on bare skin, not over a shirt. ? Don't talk during the measurement. ? ? ?Blood pressure categories  ?Blood pressure category SYSTOLIC ?(upper number)  DIASTOLIC ?(lower number)  ?Normal Less than 120 mm Hg and Less than 80 mm Hg  ?Elevated 120-129 mm Hg and Less than 80 mm Hg  ?High blood pressure: Stage 1 hypertension 130-139 mm Hg or 80-89 mm Hg  ?High blood pressure: Stage 2 hypertension 140 mm Hg or higher or 90 mm Hg or higher  ?Hypertensive crisis (consult your doctor immediately) Higher than 180 mm Hg and/or Higher than 120 mm Hg  ?Source: American Heart Association and American Stroke Association. ?For more on getting your blood pressure under control, buy Controlling Your Blood Pressure, a Special Health Report from [facility]. ? ? ?Blood Pressure Log ? ? ?Date ?  ?Time  ?Blood Pressure  ?Example: Nov 1 9 AM 124/78  ? ?    ? ?    ? ?    ? ?    ? ?    ? ?    ? ?    ? ?    ? ? ?   ?

## 2021-07-14 DIAGNOSIS — Z961 Presence of intraocular lens: Secondary | ICD-10-CM | POA: Diagnosis not present

## 2021-07-17 ENCOUNTER — Other Ambulatory Visit: Payer: Self-pay | Admitting: Gastroenterology

## 2021-07-17 ENCOUNTER — Telehealth (HOSPITAL_BASED_OUTPATIENT_CLINIC_OR_DEPARTMENT_OTHER): Payer: Self-pay

## 2021-07-17 DIAGNOSIS — K862 Cyst of pancreas: Secondary | ICD-10-CM | POA: Diagnosis not present

## 2021-07-17 NOTE — Telephone Encounter (Addendum)
Results called to patient who verbalizes understanding!  ? ? ?----- Message from Alver Sorrow, NP sent at 07/14/2021  7:27 AM EST ----- ?Stable mild thrombocytopenia (low platelet). No anemia nor infection. Cholesterol numbers at goal. Kidney function slightly decreased from discharge. Ensure staying well hydrated. Can consider repeat labs at upcoming visit with Dr. Tresa Endo. ? ?CCing Dr. Felipa Eth, patient's PCP, as [place] [facility] only. ?

## 2021-07-21 ENCOUNTER — Encounter: Payer: Self-pay | Admitting: Cardiovascular Disease

## 2021-07-21 ENCOUNTER — Other Ambulatory Visit: Payer: Self-pay

## 2021-07-21 ENCOUNTER — Ambulatory Visit: Payer: Medicare PPO | Admitting: Cardiovascular Disease

## 2021-07-21 DIAGNOSIS — Z8673 Personal history of transient ischemic attack (TIA), and cerebral infarction without residual deficits: Secondary | ICD-10-CM

## 2021-07-21 DIAGNOSIS — Z951 Presence of aortocoronary bypass graft: Secondary | ICD-10-CM | POA: Diagnosis not present

## 2021-07-21 DIAGNOSIS — Z950 Presence of cardiac pacemaker: Secondary | ICD-10-CM | POA: Diagnosis not present

## 2021-07-21 DIAGNOSIS — E785 Hyperlipidemia, unspecified: Secondary | ICD-10-CM

## 2021-07-21 DIAGNOSIS — I10 Essential (primary) hypertension: Secondary | ICD-10-CM | POA: Diagnosis not present

## 2021-07-21 DIAGNOSIS — Z952 Presence of prosthetic heart valve: Secondary | ICD-10-CM

## 2021-07-21 DIAGNOSIS — G4733 Obstructive sleep apnea (adult) (pediatric): Secondary | ICD-10-CM

## 2021-07-21 DIAGNOSIS — Z7901 Long term (current) use of anticoagulants: Secondary | ICD-10-CM | POA: Diagnosis not present

## 2021-07-21 DIAGNOSIS — I4811 Longstanding persistent atrial fibrillation: Secondary | ICD-10-CM | POA: Diagnosis not present

## 2021-07-21 DIAGNOSIS — I25118 Atherosclerotic heart disease of native coronary artery with other forms of angina pectoris: Secondary | ICD-10-CM | POA: Diagnosis not present

## 2021-07-21 MED ORDER — ISOSORBIDE MONONITRATE ER 30 MG PO TB24: 30.0000 mg | ORAL_TABLET | Freq: Every day | ORAL | 3 refills | Status: DC

## 2021-07-21 MED ORDER — AMLODIPINE BESYLATE 5 MG PO TABS: 5.0000 mg | ORAL_TABLET | Freq: Every day | ORAL | 3 refills | Status: DC

## 2021-07-21 NOTE — Patient Instructions (Signed)
Medication Instructions:  ? ?-Take amlodipine (norvasc) 5mg  once daily. ? ?-Increase isosorbide mononitrate (imdur) to 30mg  once daily. ? ?*If you need a refill on your cardiac medications before your next appointment, please call your pharmacy* ? ? ?Lab Work: ?Your physician recommends that you have labs drawn today: BMET ? ?If you have labs (blood work) drawn today and your tests are completely normal, you will receive your results only by: ?MyChart Message (if you have MyChart) OR ?A paper copy in the mail ?If you have any lab test that is abnormal or we need to change your treatment, we will call you to review the results. ? ? ?Follow-Up: ?At [facility], you and your health needs are our priority.  As part of our continuing mission to provide you with exceptional heart care, we have created designated Provider Care Teams.  These Care Teams include your primary Cardiologist (physician) and Advanced Practice Providers (APPs -  Physician Assistants and Nurse Practitioners) who all work together to provide you with the care you need, when you need it. ? ?We recommend signing up for the patient portal called "MyChart".  Sign up information is provided on this After Visit Summary.  MyChart is used to connect with patients for Virtual Visits (Telemedicine).  Patients are able to view lab/test results, encounter notes, upcoming appointments, etc.  Non-urgent messages can be sent to your provider as well.   ?To learn more about what you can do with MyChart, go to .   ? ?Your next appointment:   ?4 month(s) ? ?The format for your next appointment:   ?In Person ? ?Provider:   ?[facility], MD ?

## 2021-07-21 NOTE — Progress Notes (Unsigned)
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 swelling   Azithromycin     Other reaction(s): Does not work   Contrast Media [Iodinated Contrast Media] Other (See Comments)    Made eyes change each time    Current Outpatient Medications  Medication Sig Dispense Refill   albuterol (VENTOLIN HFA) 108 (90 Base) MCG/ACT inhaler 2 inhalations every 4 - 6 hours as needed 8.5 each 3   amLODipine (NORVASC) 2.5 MG tablet Take 1 tablet (2.5 mg total) by mouth in the morning and at bedtime. 180 tablet 1   atorvastatin (LIPITOR) 20 MG tablet Take 1 tablet (20 mg total) by mouth at bedtime. 30 tablet 0   DYMISTA 137-50 MCG/ACT SUSP Place 1-2 sprays into both nostrils daily as needed.     ELIQUIS 5 MG TABS tablet TAKE 1 TABLET BY MOUTH TWICE A DAY 60 tablet 5   ezetimibe (ZETIA) 10 MG tablet TAKE 1 TABLET BY MOUTH EVERY DAY 90 tablet 3   fenofibrate (TRICOR) 145 MG tablet TAKE 1/2 TABLETS BY MOUTH DAILY. 45 tablet 11   fluticasone (FLONASE) 50 MCG/ACT nasal spray Place 2 sprays into both nostrils daily as needed for allergies or rhinitis.      furosemide (LASIX) 40 MG tablet Take 1 tablet (40 mg) in the morning. You may take an extra 1/2 tablet (20 mg) in the evening as needed for swelling. 90 tablet 3   ipratropium (ATROVENT) 0.03 % nasal spray Place 2 sprays into both nostrils 2 (two) times daily. 30 mL 11    isosorbide mononitrate (IMDUR) 30 MG 24 hr tablet Take 0.5 tablets (15 mg total) by mouth daily. 30 tablet 0   levothyroxine (SYNTHROID) 25 MCG tablet Take 25 mcg by mouth daily before breakfast.      loratadine (CLARITIN) 10 MG tablet Take 1 tablet (10 mg total) by mouth 2 (two) times daily as needed for allergies (Can use an extra dose during flares). 60 tablet 11   metoprolol succinate (TOPROL-XL) 50 MG 24 hr tablet TAKE 0.5 TABLETS (25 MG TOTAL) BY MOUTH DAILY. TAKE WITH OR IMMEDIATELY FOLLOWING A MEAL. 45 tablet 3   Multiple Vitamins-Minerals (CENTRUM SILVER 50+MEN) TABS Take 1 tablet by mouth at bedtime.     nitroGLYCERIN (NITROLINGUAL) 0.4 MG/SPRAY spray PLACE 1 SPRAY UNDER THE TONGUE EVERY 5 (FIVE) MINUTES X 3 DOSES AS NEEDED FOR CHEST PAIN. 12 g 1   Omega-3 Fatty Acids (FISH OIL) 1000 MG CAPS Take 1,000 mg by mouth 2 (  two) times daily.      pantoprazole (PROTONIX) 40 MG tablet Take 1 tablet (40 mg total) by mouth daily. 30 tablet 11   ranolazine (RANEXA) 500 MG 12 hr tablet TAKE 1 TABLET BY MOUTH TWICE A DAY 180 tablet 2   vitamin C (ASCORBIC ACID) 500 MG tablet Take 500 mg by mouth at bedtime.      vitamin E 400 UNIT capsule Take 400 Units by mouth daily.     tamsulosin (FLOMAX) 0.4 MG CAPS capsule Take 0.4 mg by mouth at bedtime. (Patient not taking: Reported on 07/21/2021)     No current facility-administered medications for this visit.    Socially he is married and has 2 children and 4 grandchildren. There is no tobacco alcohol use. Recently he was has been very active.  ROS General: Negative; No fevers, chills, or night sweats;  HEENT: He is hard of hearing; A new complaint is that of ringing in his ears; no visual changes, sinus congestion, difficulty swallowing Pulmonary: Negative; No cough, wheezing, shortness of breath, hemoptysis Cardiovascular: See HPI GI: Positive for recent GI bleed secondary to diverticular disease GU: Negative; No dysuria, hematuria, or difficulty  voiding Musculoskeletal: Negative; no myalgias, joint pain, or weakness Hematologic/Oncology: Negative; no easy bruising, bleeding Endocrine: Negative; no heat/cold intolerance; no diabetes Neuro: Negative; no changes in balance, headaches Skin: Negative; No rashes or skin lesions Psychiatric: Negative; No behavioral problems, depression Sleep: Positive for OSA with history of snoring, daytime sleepiness, no bruxism, restless legs, hypnogognic hallucinations, no cataplexy Other comprehensive 14 point system review is negative.  PE BP (!) 160/84    Pulse 61    Ht _0  (1.727 m)    Wt 187 lb 3.2 oz (84.9 kg)    SpO2 97%    BMI 28.46 kg/m    Repeat blood pressure by me was 138/88  Wt Readings from Last 3 Encounters:  07/21/21 187 lb 3.2 oz (84.9 kg)  07/11/21 184 lb (83.5 kg)  06/28/21 181 lb 14.1 oz (82.5 kg)   General: Alert, oriented, no distress.  Skin: normal turgor, no rashes, warm and dry HEENT: Normocephalic, atraumatic. Pupils equal round and reactive to light; sclera anicteric; extraocular muscles intact;  Nose without nasal septal hypertrophy Mouth/Parynx benign; Mallinpatti scale 3 Neck: No JVD, no carotid bruits; normal carotid upstroke Lungs: clear to ausculatation and percussion; no wheezing or rales Chest wall: without tenderness to palpitation Heart: PMI not displaced, RRR, s1 s2 normal, 2/6 systolic murmur, no diastolic murmur, no rubs, gallops, thrills, or heaves Abdomen: soft, nontender; no hepatosplenomehaly, BS+; abdominal aorta nontender and not dilated by palpation. Back: no CVA tenderness Pulses 2+ Musculoskeletal: full range of motion, normal strength, no joint deformities Extremities: 2+ right lower extremity edema, 1+ left lower extremity edema; no clubbing cyanosis or edema, Homan's sign negative  Neurologic: grossly nonfocal; Cranial nerves grossly wnl Psychologic: Normal mood and affect  ECG (independently read by me): Sinus rhythm with frequent V  pacing  March 19, 2021 ECG (independently read by me): V paced at 55. Underlying AF   November 05, 2020 ECG (independently read by me):  Atrial fibrillation at 76, LVH with QRS widening, LAD;  March 3,2022 ECG (independently read by me): Atrial fibrillation at 64, QTc 480 msec  ECG (independently read by me): Atrial fibrillation at 62 with a competing junctional pacemaker, Left axis deviation, QTc 460 msec  March 2020 ECG (independently read by me): Sinus rhythm at 71 bpm.  First-degree block with 212 ms.  Right bundle branch block with repolarization changes.  Left anterior hemiblock.  January 26, 2018 ECG (independently read by me): Sinus bradycardia at 47 bpm with first-degree AV block.  PR interval 218 ms.  LVH with QRS widening.  Left axis deviation.  October 28, 2017 ECG (independently read by me): Atrial fibrillation with variable rate that seems to be more in the 50s to 60s but following a pause drops into the 40s  August 26, 2017 ECG (independently read by me): atrial fibrillation at 56 bpm.  Nonspecific ST changes.  QTc interval 476 ms.  February 2019 ECG (independently read by me): Atrial fibrillation at 71 bpm.  LVH by voltage in aVL.  Nonspecific T changes.  04/27/2017 ECG (independently read by me): Normal sinus rhythm at 60 bpm.  Nonspecific intraventricular block.  Anterior T-wave abnormality  03/23/2017 ECG (independently read by me): atrial fibrillation at 64 bpm.  Nonspecific interventricular block.  Nonspecific T wave abnormality.  02/17/2017 ECG (independently read by me): Atrial fibrillation at 72 bpm.  RV conduction delay.  QTc interval 453 ms.  01/29/2017 ECG (independently read by me): Atrial fibrillation at 56 bpm.  LVH by voltage criteria.  Nonspecific ST changes.  QTc interval 430 ms.  August 2018 ECG (independently read by me): Sinus bradycardia 59 bpm.  LVH by voltage.  QTc interval 469 ms.  No significant ST-T changes.  April 2018 ECG (independently read by  me): Sinus rhythm at 61 bpm.  RV conduction delay.  LVH.  March 2018 ECG (independently read by me): Atrial fibrillation at 71 bpm.  RV conduction delay.  QTc interval 456 ms.  Left axis deviation.  February 2018 ECG (independently read by me): Normal sinus rhythm at 60 bpm.  LVH.  QTc interval at 464 ms.  Nonspecific T abnormality  December 2017 ECG (independently read by me):  Sinus bradycardia 57 bpm.  LVH by voltage. No significant ST changes.  January 10, 2016 ECG (independently read by me): Normal sinus rhythm at 64 bpm.  LVH.  QTc interval 468 ms.  December 2016 ECG (independently read by me):  Sinus bradycardia 51 bpm.  No ectopy. QTc interval 429 ms.  LVH by voltage criteria in aVL.  September 2016 ECG (independently read by me):  Sinus bradycardia with first-degree AV block. RV conduction delay. No significant ST-T changes.   August 2016 ECG (independently read by me): Sinus bradycardia 57 bpm.  Mild RV conduction delay.  May 2016 ECG (independently read by me): Sinus bradycardia at 49 bpm with first-degree AV block with a PR interval 218 ms.  Right reticular conduction delay.  December 2015 ECG (independently read by me).  For these: Sinus bradycardia 57 bpm.  First-degree AV block with a PR interval at 224 ms.  No significant ST-T changes.  March 2015 ECG (independently read by me): Sinus rhythm at 59 beats per minute. Mild LVH by voltage criteria in aVL. Normal intervals.  01/05/2013 ECG: Normal sinus rhythm at 62. Mild RV conduction delay. PR interval 204 ms, QTc interval 452 ms.  LABS: BMP Latest Ref Rng & Units 07/11/2021 06/28/2021 04/23/2021  Glucose 70 - 99 mg/dL 90 116(H) 95  BUN 8 - 27 mg/dL 27 26(H) 27  Creatinine 0.76 - 1.27 mg/dL 1.71(H) 1.43(H) 1.71(H)  BUN/Creat Ratio 10 - 24 16 - 16  Sodium 134 - 144 mmol/L 144 141 144  Potassium 3.5 - 5.2 mmol/L 4.0 3.3(L) 4.3  Chloride 96 - 106 mmol/L 112(H) 108 108(H)  CO2 20 - 29  mmol/L _0 Calcium 8.6 - 10.2 mg/dL  9.6 9.1 9.2   Hepatic Function Latest Ref Rng & Units 06/28/2021 11/07/2020 11/10/2019  Total Protein 6.5 - 8.1 g/dL 6.5 6.5 5.8(L)  Albumin 3.5 - 5.0 g/dL 3.5 4.2 3.3(L)  AST 15 - 41 U/L 25 24 41  ALT 0 - 44 U/L 22 18 50(H)  Alk Phosphatase 38 - 126 U/L 43 62 45  Total Bilirubin 0.3 - 1.2 mg/dL 0.6 0.4 0.9  Bilirubin, Direct 0.1 - 0.5 mg/dL - - -   CBC Latest Ref Rng & Units 07/11/2021 07/01/2021 06/30/2021  WBC 3.4 - 10.8 x10E3/uL 6.5 5.4 6.8  Hemoglobin 13.0 - 17.7 g/dL 13.7 11.9(L) 12.0(L)  Hematocrit 37.5 - 51.0 % 42.0 37.0(L) 37.3(L)  Platelets 150 - 450 x10E3/uL 146(L) 132(L) 145(L)   Lab Results  Component Value Date   MCV 92 07/11/2021   MCV 92.5 07/01/2021   MCV 91.6 06/30/2021   Lab Results  Component Value Date   TSH 1.671 06/29/2021   Lab Results  Component Value Date   HGBA1C 5.5 06/28/2021     Lipid Panel     Component Value Date/Time   CHOL 108 07/11/2021 0927   CHOL 137 04/09/2014 1115   TRIG 123 07/11/2021 0927   TRIG 182 (H) 04/09/2014 1115   HDL 38 (L) 07/11/2021 0927   HDL 32 (L) 04/09/2014 1115   CHOLHDL 2.8 07/11/2021 0927   CHOLHDL 3.2 06/28/2021 2349   VLDL 14 06/28/2021 2349   LDLCALC 48 07/11/2021 0927   LDLCALC 69 04/09/2014 1115     RADIOLOGY: No results found.  A CPAP download was obtained in the office with reference to his obstructive sleep apnea from August 18 through January 24, 2018. He is 100% compliant with usage days and averaging 6 hours and 35 minutes of usage daily.  At his 12 cm pressure, AHI is excellent at 0.9.  He does have a mask leak.  CPAP November 10 through April 18, 2018: Compliance 97% usage days and usage greater than 4 hours.  Average use 6 hours 46 minutes.  At 12 cm pressure, AHI 1.2/h.  CPAP download June 25, 2018 through July 24, 2018.  Usage 87%, usage greater than 4 hours 83%, average usage on days used 6 hours and 34 minutes.  AHI 1.2 8012 cm pressure.  A download was obtained in the office from  May 2022 through November 04, 2020.  Usage days only 63%.  Average usage is 6 hours 22 minutes.  AHI 5.0 with a auto minimum pressure 11 maximum pressure 16 with 95 percentile pressure 11.5.  There is mask leak.  IMPRESSION:  1. Essential hypertension      ASSESSMENT AND PLAN: Mr. Rossbach is an 86 year-old  male who is status post CABG revascularization surgery in 1994.  Subsequently he underwent DES stenting to the graft supplying the diagonal vessel in 2003.  In July 2017 he was hospitalized with atrial fibrillation in the setting of bradycardia and hypotension leading to medication adjustment  An echo Doppler study of 11/21/2015 showed an EF of 55-60%, mild aortic stenosis with a valve area of 1.75 mm per there was mild pulmonary hypertension. Subsequently he had complained of experiencing some episodes of chest tightness and his symptoms improved with further titration of nitrates as well as beta blocker therapy.  He developed significant anemia, which undoubtedly exacerbated his anginal symptomatology.  At catheterization in July 2018 although his LIMA to mid LAD and  vein graft to diagonal vessel are patent the very proximal LAD had a 95% stenosis which supplies a diagonal vessel, which was not supplied by the grafts.  His native RCA and graft to RCA was occluded and there is some collateralization to the distal RCA via the vein graft supplying the circumflex marginal vessel.  His anginal symptoms improved with stabilization of his hemoglobin.  Spironolactone was discontinued secondary to renal insufficiency.  For 3 years he was treated with medication for his class II anginal symptomatology.  However in June 2021 anginal symptoms progressed.  At that time repeat catheterization revealed similar CAD but progressive aortic valve stenosis.  He underwent successful TAVR with a 26 mm sapient 3 ultra TH VV the transfemoral approach on November 14, 2019.  Subsequently he developed several syncopal spells was found to  have prolonged pauses of 8 and 11 seconds for which she required insertion of a dual-chamber [employer] permanent pacemaker by Dr. Caryl Comes on November 20, 2019.  He had had recurrent episodes of atrial fibrillation and despite cardioversion return back to atrial fibrillation.  He is now in persistent long-term atrial fibrillation.  Amiodarone was discontinued . Since he had concomitant CAD I attempted to further titrate Ranexa to 1000 twice daily since he was no longer on amiodarone but apparently he believes that this may have contributed to nausea and possibly ringing in his ears.  As result he is only been taking 500 mg twice daily as previously recommended.  Presently, he continues to be in atrial fibrillation with a controlled ventricular response.  He is on Eliquis for anticoagulation and is without bleeding.  Presently, he admits to increased stress on the home front.  When he overexerts himself he has experienced some exertional chest pain symptomatology which has not been progressive.  His blood pressure today is elevated on amlodipine 2.5 mg which he has been taking twice a day, furosemide for which she is only been taking 20 mg daily in addition to his isosorbide 90 mg in the morning and 30 mg at night and metoprolol succinate 25 mg daily.  I have recommended further titration of his morning dose of isosorbide to 120 mg in the morning and he will continue his 30 mg evening dose.  With his significant lower extremity edema right greater than left I have recommended that he take furosemide 40 mg every morning.  If there are days where there is still significant swelling in the afternoon he can take 20 mg in the afternoon.  He continues to be on atorvastatin 20 mg, Zetia 10 mg, and fenofibrate for his mixed hyperlipidemia.  His atrial fibrillation is controlled and his ECG today shows a paced rhythm with his underlying AF.  He continues to be on low-dose levothyroxine at 25 mcg.  I reviewed his CPAP data with him  in detail.  We have contacted his DME company.  I have provided him with a new sample mask which he states he was having difficulty with his prior mask and have given him an AirTouch F 20 mask.  He will take his equipment to the DME company for possible new water chamber or gasket.  I will see him in 4 months for reevaluation or sooner as needed.    Troy Sine, MD, [facility]  07/21/2021 3:57 PM

## 2021-07-22 LAB — BASIC METABOLIC PANEL
BUN/Creatinine Ratio: 19 (ref 10–24)
BUN: 30 mg/dL — ABNORMAL HIGH (ref 8–27)
CO2: 20 mmol/L (ref 20–29)
Calcium: 9.6 mg/dL (ref 8.6–10.2)
Chloride: 111 mmol/L — ABNORMAL HIGH (ref 96–106)
Creatinine, Ser: 1.59 mg/dL — ABNORMAL HIGH (ref 0.76–1.27)
Glucose: 90 mg/dL (ref 70–99)
Potassium: 4 mmol/L (ref 3.5–5.2)
Sodium: 145 mmol/L — ABNORMAL HIGH (ref 134–144)
eGFR: 43 mL/min/{1.73_m2} — ABNORMAL LOW (ref >59)

## 2021-07-26 ENCOUNTER — Other Ambulatory Visit: Payer: Self-pay | Admitting: Physician Assistant

## 2021-07-26 ENCOUNTER — Other Ambulatory Visit: Payer: Self-pay | Admitting: Cardiovascular Disease

## 2021-07-26 ENCOUNTER — Telehealth: Payer: Self-pay | Admitting: Physician Assistant

## 2021-07-26 DIAGNOSIS — I4819 Other persistent atrial fibrillation: Secondary | ICD-10-CM

## 2021-07-26 MED ORDER — APIXABAN 5 MG PO TABS: 5.0000 mg | ORAL_TABLET | Freq: Two times a day (BID) | ORAL | 0 refills | Status: DC

## 2021-07-26 NOTE — Telephone Encounter (Signed)
Received page that patient is requesting a refill for eliquis. It appears he now qualifies for 2.5 mg eliquis BID for renal function. In speaking with the patient's wife, she states Dr. Tresa Endo instructed staying on 5 mg eliquis for now since he was just hospitalized with possible TIA after missing one dose of eliquis. Unclear if this was before or after labs that day.  ? ?Age 86 ?sCr 1.59 ? ?I will refill 30 days of eliquis at 5 mg BID - will ask Dr. Tresa Endo to weigh in on dosage moving forward.  ?

## 2021-07-27 ENCOUNTER — Encounter: Payer: Self-pay | Admitting: Cardiovascular Disease

## 2021-07-29 ENCOUNTER — Other Ambulatory Visit: Payer: Self-pay

## 2021-07-29 DIAGNOSIS — N183 Chronic kidney disease, stage 3 unspecified: Secondary | ICD-10-CM

## 2021-07-29 NOTE — Telephone Encounter (Signed)
His creatinine had improved but remain mildly elevated.  I requested that it be repeated in several days or next week.  If still remains elevated > 1.5 his dose will need to be reduced to 2.5 mg twice daily ?

## 2021-07-29 NOTE — Telephone Encounter (Signed)
Called and spoke with pt's wife. She is made aware Dr. Claiborne Billings would like blood work repeated. They are agreeable to come in Friday or Monday. Went over instructions of how to have labs drawn. She verbalized understanding of instructions and thanked me for calling them.  ?

## 2021-08-01 ENCOUNTER — Other Ambulatory Visit (HOSPITAL_COMMUNITY): Payer: Self-pay | Admitting: Gastroenterology

## 2021-08-01 DIAGNOSIS — K862 Cyst of pancreas: Secondary | ICD-10-CM

## 2021-08-04 DIAGNOSIS — N183 Chronic kidney disease, stage 3 unspecified: Secondary | ICD-10-CM | POA: Diagnosis not present

## 2021-08-05 LAB — BASIC METABOLIC PANEL
BUN/Creatinine Ratio: 15 (ref 10–24)
BUN: 24 mg/dL (ref 8–27)
CO2: 22 mmol/L (ref 20–29)
Calcium: 9 mg/dL (ref 8.6–10.2)
Chloride: 105 mmol/L (ref 96–106)
Creatinine, Ser: 1.59 mg/dL — ABNORMAL HIGH (ref 0.76–1.27)
Glucose: 86 mg/dL (ref 70–99)
Potassium: 3.9 mmol/L (ref 3.5–5.2)
Sodium: 143 mmol/L (ref 134–144)
eGFR: 43 mL/min/{1.73_m2} — ABNORMAL LOW (ref >59)

## 2021-08-08 DIAGNOSIS — N183 Chronic kidney disease, stage 3 unspecified: Secondary | ICD-10-CM | POA: Diagnosis not present

## 2021-08-08 DIAGNOSIS — I130 Hypertensive heart and chronic kidney disease with heart failure and stage 1 through stage 4 chronic kidney disease, or unspecified chronic kidney disease: Secondary | ICD-10-CM | POA: Diagnosis not present

## 2021-08-08 DIAGNOSIS — E785 Hyperlipidemia, unspecified: Secondary | ICD-10-CM | POA: Diagnosis not present

## 2021-08-08 DIAGNOSIS — I480 Paroxysmal atrial fibrillation: Secondary | ICD-10-CM | POA: Diagnosis not present

## 2021-08-12 ENCOUNTER — Other Ambulatory Visit: Payer: Self-pay | Admitting: Cardiovascular Disease

## 2021-08-14 ENCOUNTER — Encounter: Payer: Self-pay | Admitting: Internal Medicine

## 2021-08-14 ENCOUNTER — Ambulatory Visit: Payer: Medicare PPO | Admitting: Internal Medicine

## 2021-08-14 VITALS — BP 160/80 | HR 83 | Ht 68.0 in | Wt 186.0 lb

## 2021-08-14 DIAGNOSIS — R55 Syncope and collapse: Secondary | ICD-10-CM | POA: Diagnosis not present

## 2021-08-14 DIAGNOSIS — I4821 Permanent atrial fibrillation: Secondary | ICD-10-CM

## 2021-08-14 DIAGNOSIS — I5032 Chronic diastolic (congestive) heart failure: Secondary | ICD-10-CM | POA: Diagnosis not present

## 2021-08-14 DIAGNOSIS — I442 Atrioventricular block, complete: Secondary | ICD-10-CM

## 2021-08-14 DIAGNOSIS — I10 Essential (primary) hypertension: Secondary | ICD-10-CM

## 2021-08-14 MED ORDER — AMLODIPINE BESYLATE 10 MG PO TABS: 10.0000 mg | ORAL_TABLET | Freq: Every day | ORAL | 3 refills | Status: DC

## 2021-08-14 MED ORDER — APIXABAN 2.5 MG PO TABS: 2.5000 mg | ORAL_TABLET | Freq: Two times a day (BID) | ORAL | 3 refills | Status: DC

## 2021-08-14 NOTE — Progress Notes (Signed)
? ? ? ? ?Patient Care Team: ?Prince Solian, MD as PCP - General (Internal Medicine) ?Troy Sine, MD as PCP - Cardiology (Cardiology) ?Deboraha Sprang, MD as PCP - Electrophysiology (Cardiology) ?Kozlow, Donnamarie Poag, MD as Consulting Physician (Allergy and Immunology) ? ? ?HPI ? ?Jorge Cherry is a 86 y.o. male seen in follow-up for right bundle branch block pre-TAVR and left bundle branch block post TAVR and syncope for which he underwent pacemaker implantation 7/21 ([employer]).  Atrial fibrillation permanent.  Anticoagulation with apixaban. ?He has a history of ischemic heart disease with prior bypass surgery 1994 most recent catheterization 7/18 with a patent LIMA, patent vein graft to OM 3 and D2 and occlusion to his RCA.    ?   ? ?2 interval cardioversion, 8/11 and 02/05/2020.  Sinus rhythm held for less than 24 hours.  He was started on amiodarone following the first cardioversion.  He recalls no benefits from cardioversion ? ?Hospitalized 2/23 with visual changes concerning for stroke/TIA.,  Described by Dr. Leda Gauze "some double vision in his right eye "he had been hypotensive in the hospital and his antihypertensives have been decreased; reviewing Dr. Linton Ham note 07/21/2021 he was noted to be hypertensive in the office and his amlodipine was renewed and then uptitrated, isosorbide was also uptitrated. ? ?The patient denies chest pain, shortness of breath, nocturnal dyspnea, orthopnea or peripheral edema.  There have been no palpitations, lightheadedness or syncope.   ?DATE TEST EF   ?6/21   LIMA>>LAD; SVG>>OM3;SVG>>D2 patent ? SVG>>RCA T  ?6/21 Echo   55-60 %   ?8/21 Echo  60%   ?2/23 Echo  50-55% LAE    ? ? ? ?Date Cr K Hgb  ?7/21/  1.39 3.9 11.4  ?3/23  1.59 3.9 13.7  ?  ?  ?Thromboembolic risk factors ( age  -2, HTN-1, TIA/CVA-2, Vasc disease -1) for a CHADSVASc Score of >6 ?   ? ?Records and Results Reviewed ma'am ? ?Past Medical History:  ?Diagnosis Date  ? Arthritis   ? "just a touch in my hands" (11/24/2016)   ? Asthma   ? Atherosclerosis of renal artery ([place])   ? RENAL DOPPLER, 12/10/2011 - Left renal artery demonstrated narrowing with elevated velocities consistent with a 1-59% diameter reduction  ? CKD (chronic kidney disease) stage 3, GFR 30-59 ml/min (HCC)   ? "stable now since they backed off the water pills" (11/24/2016)  ? Coronary artery disease   ? a. 1994 s/p CABG x 4 (LIMA-LAD, VG->D2, VG->OM, VG->RCA); b. 02/2004 PCI SVG-D2 (3.5x16 Taxus DES). VG->RCA 100. Sev apical LAD dzs distal to LIMA insertion; c. 11/2016 Cath: LAD 95p/143m, D2 100ost, LCX 100ost, 70p/m, RCA 100p/m, RPDA fills via L->R collats. VG->RCA 100, VG->D2 20 ost, patent prox stent, 23m, LIMA->LAD ok, VG->OM3 20p. EF 55%-->Med Rx.  ? GERD (gastroesophageal reflux disease)   ? High cholesterol   ? History of lower GI bleeding   ? a. 09/2014 GIB due to diverticulosis/diverticulitis.  ? Iron deficiency anemia   ? Labile Hypertension   ? Moderate aortic stenosis   ? a. 10/2011 Echo:  EF >55%, mild-mod TR, mild-mod AS, mod Ca2+ of AoV leaflets; b. 07/2016 Echo: EF 60-65%, no rwma, Gr1 DD, mod AS [(S) mean grad 52mmHg, peak grad 39mmHg. Valve area (VTI): 1.33cm^2, (Vmax) 1.44cm^2. Mild MR]; c. 02/2018 Echo: EF 55-60%, no rwma, GR1 DD, Mod AS [Peak Vel (S): 319cm/s, Mean grad (S) 49mmHg, peak grad (S) 47mmHg].  ? PAF (paroxysmal atrial fibrillation) (  [place])   ? a. CHA2DS2VASc = 4-->Eliquis.  ? S/P TAVR (transcatheter aortic valve replacement) 11/14/2019  ? s/p TAVR with a 26 mm Edwards S3U via the TF approach by Drs Angelena Form & Bartle  ? Sleep apnea   ? ? ?Past Surgical History:  ?Procedure Laterality Date  ? CARDIAC CATHETERIZATION  02/27/2004  ? Coronary intervention and medical management  ? CARDIAC CATHETERIZATION  11/24/2016  ? CARDIOVERSION N/A 12/20/2019  ? Procedure: CARDIOVERSION;  Surgeon: Skeet Latch, MD;  Location: [place];  Service: Cardiovascular;  Laterality: N/A;  ? CARDIOVERSION N/A 02/05/2020  ? Procedure: CARDIOVERSION;  Surgeon:  Josue Hector, MD;  Location: [facility];  Service: Cardiovascular;  Laterality: N/A;  ? CATARACT EXTRACTION W/ INTRAOCULAR LENS IMPLANT Left   ? CORONARY ANGIOPLASTY  1994 X 2  ? "before bypass surgery"  ? CORONARY ANGIOPLASTY WITH STENT PLACEMENT  03/04/2004  ? SVG supplying the diagonal vessel stented with a 3.5x13mm Taxus stent post dilated to 4.0 mm  ? CORONARY ARTERY BYPASS GRAFT  1994  ? "CABG X4"  ? INGUINAL HERNIA REPAIR Right   ? PACEMAKER IMPLANT N/A 11/20/2019  ? Procedure: PACEMAKER IMPLANT;  Surgeon: Deboraha Sprang, MD;  Location: [facility] [facility];  Service: Cardiovascular;  Laterality: N/A;  ? RIGHT HEART CATH AND CORONARY/GRAFT ANGIOGRAPHY N/A 11/24/2016  ? Procedure: Right Heart Cath and Coronary/Graft Angiography;  Surgeon: Troy Sine, MD;  Location: [place] [facility];  Service: Cardiovascular;  Laterality: N/A;  ? RIGHT/LEFT HEART CATH AND CORONARY/GRAFT ANGIOGRAPHY N/A 10/18/2019  ? Procedure: RIGHT/LEFT HEART CATH AND CORONARY/GRAFT ANGIOGRAPHY;  Surgeon: Troy Sine, MD;  Location: [place];  Service: Cardiovascular;  Laterality: N/A;  ? TEE WITHOUT CARDIOVERSION N/A 11/14/2019  ? Procedure: TRANSESOPHAGEAL ECHOCARDIOGRAM (TEE);  Surgeon: Burnell Blanks, MD;  Location: [facility];  Service: Open Heart Surgery;  Laterality: N/A;  ? TONSILLECTOMY AND ADENOIDECTOMY    ? TRANSCATHETER AORTIC VALVE REPLACEMENT, TRANSFEMORAL N/A 11/14/2019  ? Procedure: TRANSCATHETER AORTIC VALVE REPLACEMENT, TRANSFEMORAL;  Surgeon: Burnell Blanks, MD;  Location: [place] [facility];  Service: Open Heart Surgery;  Laterality: N/A;  ? ? ?Current Meds  ?Medication Sig  ? albuterol (VENTOLIN HFA) 108 (90 Base) MCG/ACT inhaler 2 inhalations every 4 - 6 hours as needed  ? amLODipine (NORVASC) 5 MG tablet Take 1 tablet (5 mg total) by mouth daily.  ? apixaban (ELIQUIS) 5 MG TABS tablet Take 1 tablet (5 mg total) by mouth 2 (two) times daily.  ? atorvastatin (LIPITOR) 20 MG tablet  Take 1 tablet (20 mg total) by mouth at bedtime.  ? docusate sodium (COLACE) 100 MG capsule Take 100 mg by mouth 2 (two) times daily as needed for mild constipation.  ? DYMISTA 137-50 MCG/ACT SUSP Place 1-2 sprays into both nostrils daily as needed.  ? ezetimibe (ZETIA) 10 MG tablet TAKE 1 TABLET BY MOUTH EVERY DAY  ? fenofibrate (TRICOR) 145 MG tablet TAKE 1/2 TABLETS BY MOUTH DAILY.  ? finasteride (PROSCAR) 5 MG tablet Take 5 mg by mouth daily.  ? fluticasone (FLONASE) 50 MCG/ACT nasal spray Place 2 sprays into both nostrils daily as needed for allergies or rhinitis.   ? furosemide (LASIX) 40 MG tablet Take 1 tablet (40 mg) in the morning. ?You may take an extra 1/2 tablet (20 mg) in the evening as needed for swelling.  ? ipratropium (ATROVENT) 0.03 % nasal spray Place 2 sprays into both nostrils 2 (two) times daily.  ?  isosorbide mononitrate (IMDUR) 30 MG 24 hr tablet Take 1 tablet (30 mg total) by mouth daily.  ? levothyroxine (SYNTHROID) 25 MCG tablet Take 25 mcg by mouth daily before breakfast.   ? loratadine (CLARITIN) 10 MG tablet Take 1 tablet (10 mg total) by mouth 2 (two) times daily as needed for allergies (Can use an extra dose during flares).  ? Multiple Vitamins-Minerals (CENTRUM SILVER 50+MEN) TABS Take 1 tablet by mouth at bedtime.  ? nitroGLYCERIN (NITROLINGUAL) 0.4 MG/SPRAY spray PLACE 1 SPRAY UNDER THE TONGUE EVERY 5 (FIVE) MINUTES X 3 DOSES AS NEEDED FOR CHEST PAIN.  ? Omega-3 Fatty Acids (FISH OIL) 1000 MG CAPS Take 1,000 mg by mouth 2 (two) times daily.   ? pantoprazole (PROTONIX) 40 MG tablet Take 1 tablet (40 mg total) by mouth daily.  ? ranolazine (RANEXA) 500 MG 12 hr tablet TAKE 1 TABLET BY MOUTH TWICE A DAY  ? vitamin C (ASCORBIC ACID) 500 MG tablet Take 500 mg by mouth at bedtime.   ? vitamin E 400 UNIT capsule Take 400 Units by mouth daily.  ? ? ?Allergies  ?Allergen Reactions  ? Altace [Ramipril] Swelling and Other (See Comments)  ?  Mouth swelling  ? Mucinex [Guaifenesin Er] Hives,  Swelling and Other (See Comments)  ?  Mouth swelling  ? Azithromycin   ?  Other reaction(s): Does not work  ? Contrast Media [Iodinated Contrast Media] Other (See Comments)  ?  Made eyes change each time

## 2021-08-14 NOTE — Patient Instructions (Addendum)
Medication Instructions:  ?Your physician has recommended you make the following change in your medication:  ? ?** Stop Metoprolol ? ?** Increase Amlodipine to 10mg  by mouth daily.  You may take 2 5mg  tablets until you need a refill.  The new dose has been sent to your pharmacy. ? ?** Decrease your Eliquis to 2.5mg  - 1/2 tablet by mouth twice daily.  The new doseage has also been sent to your pharmacy.  When this is refilled you will take 1 tablet (2.5mg ) tablet by mouth twice daily. ? ?*If you need a refill on your cardiac medications before your next appointment, please call your pharmacy* ? ? ?Lab Work: ?None ordered. ? ?If you have labs (blood work) drawn today and your tests are completely normal, you will receive your results only by: ?MyChart Message (if you have MyChart) OR ?A paper copy in the mail ?If you have any lab test that is abnormal or we need to change your treatment, we will call you to review the results. ? ? ?Testing/Procedures: ?None ordered. ? ? ? ?Follow-Up: ?At [facility], you and your health needs are our priority.  As part of our continuing mission to provide you with exceptional heart care, we have created designated Provider Care Teams.  These Care Teams include your primary Cardiologist (physician) and Advanced Practice Providers (APPs -  Physician Assistants and Nurse Practitioners) who all work together to provide you with the care you need, when you need it. ? ?We recommend signing up for the patient portal called "MyChart".  Sign up information is provided on this After Visit Summary.  MyChart is used to connect with patients for Virtual Visits (Telemedicine).  Patients are able to view lab/test results, encounter notes, upcoming appointments, etc.  Non-urgent messages can be sent to your provider as well.   ?To learn more about what you can do with MyChart, go to .   ? ?Your next appointment:   ?12 months with Dr [facility] ?

## 2021-08-21 ENCOUNTER — Ambulatory Visit (INDEPENDENT_AMBULATORY_CARE_PROVIDER_SITE_OTHER): Payer: Medicare PPO

## 2021-08-21 DIAGNOSIS — I4821 Permanent atrial fibrillation: Secondary | ICD-10-CM

## 2021-08-21 LAB — CUP PACEART REMOTE DEVICE CHECK
Battery Remaining Longevity: 145 mo
Battery Voltage: 3.01 V
Brady Statistic RA Percent Paced: 0 %
Brady Statistic RV Percent Paced: 25.79 %
Date Time Interrogation Session: 20230413010255
Implantable Lead Implant Date: 20210712
Implantable Lead Implant Date: 20210712
Implantable Lead Location: 753859
Implantable Lead Location: 753860
Implantable Lead Model: 5076
Implantable Lead Model: 5076
Implantable Pulse Generator Implant Date: 20210712
Lead Channel Impedance Value: 304 Ohm
Lead Channel Impedance Value: 361 Ohm
Lead Channel Impedance Value: 418 Ohm
Lead Channel Impedance Value: 418 Ohm
Lead Channel Pacing Threshold Amplitude: 0.625 V
Lead Channel Pacing Threshold Amplitude: 1 V
Lead Channel Pacing Threshold Pulse Width: 0.4 ms
Lead Channel Pacing Threshold Pulse Width: 0.4 ms
Lead Channel Sensing Intrinsic Amplitude: 1 mV
Lead Channel Sensing Intrinsic Amplitude: 1 mV
Lead Channel Sensing Intrinsic Amplitude: 12.125 mV
Lead Channel Sensing Intrinsic Amplitude: 12.125 mV
Lead Channel Setting Pacing Amplitude: 2.5 V
Lead Channel Setting Pacing Pulse Width: 0.4 ms
Lead Channel Setting Sensing Sensitivity: 0.9 mV

## 2021-08-27 DIAGNOSIS — I480 Paroxysmal atrial fibrillation: Secondary | ICD-10-CM | POA: Diagnosis not present

## 2021-08-27 DIAGNOSIS — I350 Nonrheumatic aortic (valve) stenosis: Secondary | ICD-10-CM | POA: Diagnosis not present

## 2021-08-27 DIAGNOSIS — I25810 Atherosclerosis of coronary artery bypass graft(s) without angina pectoris: Secondary | ICD-10-CM | POA: Diagnosis not present

## 2021-08-27 DIAGNOSIS — I130 Hypertensive heart and chronic kidney disease with heart failure and stage 1 through stage 4 chronic kidney disease, or unspecified chronic kidney disease: Secondary | ICD-10-CM | POA: Diagnosis not present

## 2021-08-27 DIAGNOSIS — H532 Diplopia: Secondary | ICD-10-CM | POA: Diagnosis not present

## 2021-08-27 DIAGNOSIS — E785 Hyperlipidemia, unspecified: Secondary | ICD-10-CM | POA: Diagnosis not present

## 2021-08-27 DIAGNOSIS — E032 Hypothyroidism due to medicaments and other exogenous substances: Secondary | ICD-10-CM | POA: Diagnosis not present

## 2021-08-27 DIAGNOSIS — N1831 Chronic kidney disease, stage 3a: Secondary | ICD-10-CM | POA: Diagnosis not present

## 2021-08-27 DIAGNOSIS — I5032 Chronic diastolic (congestive) heart failure: Secondary | ICD-10-CM | POA: Diagnosis not present

## 2021-09-01 ENCOUNTER — Telehealth: Payer: Self-pay | Admitting: Cardiovascular Disease

## 2021-09-01 DIAGNOSIS — I10 Essential (primary) hypertension: Secondary | ICD-10-CM

## 2021-09-01 DIAGNOSIS — Z79899 Other long term (current) drug therapy: Secondary | ICD-10-CM

## 2021-09-01 NOTE — Telephone Encounter (Signed)
Pt c/o BP issue: STAT if pt c/o blurred vision, one-sided weakness or slurred speech ? ?1. What are your last 5 BP readings?  ?150/97 - Today ?174/106 ?158/98 ?162/96 ?150/98 ? ? ?2. Are you having any other symptoms (ex. Dizziness, headache, blurred vision, passed out)? No ? ?3. What is your BP issue? Pt states that since being taken off Metoprolol he has been experiencing high BP readings. Please advise ? ?

## 2021-09-01 NOTE — Telephone Encounter (Signed)
Spoke with spouse. ?Patient's last 5 BP readings were:  ?150/97 - Today ?174/106 ?158/98 ?162/96 ?150/98 ?On 08/14/21, patient was taken off metoprolol succinate 25 mg daily.  ?He takes amlodipine 10 mg daily. ?He take Imudr 30 mg daily. ?Wife wants to know if patient should be back on metoprolol succinate 25 mg daily. ?Please advise. ?

## 2021-09-05 NOTE — Progress Notes (Signed)
Remote pacemaker transmission.   

## 2021-09-07 DIAGNOSIS — I130 Hypertensive heart and chronic kidney disease with heart failure and stage 1 through stage 4 chronic kidney disease, or unspecified chronic kidney disease: Secondary | ICD-10-CM | POA: Diagnosis not present

## 2021-09-07 DIAGNOSIS — E785 Hyperlipidemia, unspecified: Secondary | ICD-10-CM | POA: Diagnosis not present

## 2021-09-07 DIAGNOSIS — I480 Paroxysmal atrial fibrillation: Secondary | ICD-10-CM | POA: Diagnosis not present

## 2021-09-08 MED ORDER — HYDROCHLOROTHIAZIDE 25 MG PO TABS: 25.0000 mg | ORAL_TABLET | Freq: Every day | ORAL | 1 refills | Status: DC

## 2021-09-08 NOTE — Telephone Encounter (Signed)
Acknowledged.

## 2021-09-08 NOTE — Telephone Encounter (Signed)
Mindi Junker ?Please thank wife for BP reports, we stopped the metoprolol to try to decrease Vpacing and sounds like he needs more BP support ?Lets try adding HCTZ 25 and see how that does, with BMET in about two weeks ?Thanks SK   ?

## 2021-09-08 NOTE — Telephone Encounter (Signed)
Spoke with pt's wife, DPR and advised per Dr Caryl Comes pt should start HCTZ 25mg  - 1 tablet by mouth daily in the morning.  Pt will need to have BMET labs in 2 weeks.  Order placed and released.  Pt's wife verbalizes understanding and agrees with current plan. ?

## 2021-09-14 ENCOUNTER — Other Ambulatory Visit: Payer: Self-pay | Admitting: Cardiovascular Disease

## 2021-09-16 ENCOUNTER — Ambulatory Visit (HOSPITAL_COMMUNITY): Admission: RE | Admit: 2021-09-16 | Payer: Medicare PPO | Source: Ambulatory Visit

## 2021-09-22 ENCOUNTER — Telehealth: Payer: Self-pay

## 2021-09-22 ENCOUNTER — Other Ambulatory Visit
Admission: RE | Admit: 2021-09-22 | Discharge: 2021-09-22 | Disposition: A | Discharge status: Home / Self Care | Payer: Medicare PPO | Attending: Internal Medicine | Admitting: Internal Medicine

## 2021-09-22 DIAGNOSIS — I10 Essential (primary) hypertension: Secondary | ICD-10-CM | POA: Insufficient documentation

## 2021-09-22 DIAGNOSIS — Z79899 Other long term (current) drug therapy: Secondary | ICD-10-CM | POA: Insufficient documentation

## 2021-09-22 DIAGNOSIS — N183 Chronic kidney disease, stage 3 unspecified: Secondary | ICD-10-CM

## 2021-09-22 LAB — BASIC METABOLIC PANEL
Anion gap: 12 (ref 5–15)
BUN: 61 mg/dL — ABNORMAL HIGH (ref 8–23)
CO2: 26 mmol/L (ref 22–32)
Calcium: 9.7 mg/dL (ref 8.9–10.3)
Chloride: 102 mmol/L (ref 98–111)
Creatinine, Ser: 2.43 mg/dL — ABNORMAL HIGH (ref 0.61–1.24)
GFR, Estimated: 26 mL/min — ABNORMAL LOW (ref >60)
Glucose, Bld: 130 mg/dL — ABNORMAL HIGH (ref 70–99)
Potassium: 3 mmol/L — ABNORMAL LOW (ref 3.5–5.1)
Sodium: 140 mmol/L (ref 135–145)

## 2021-09-22 MED ORDER — POTASSIUM CHLORIDE CRYS ER 20 MEQ PO TBCR: EXTENDED_RELEASE_TABLET | ORAL | 0 refills | Status: DC

## 2021-09-22 MED ORDER — CARVEDILOL 3.125 MG PO TABS: 3.1250 mg | ORAL_TABLET | Freq: Two times a day (BID) | ORAL | 1 refills | Status: DC

## 2021-09-22 NOTE — Telephone Encounter (Signed)
Phone call to pt and advised of Dr Earmon Phoenix recommendations as below.  Pt verbalizes understanding and  agrees with current plan. ? ?Spoke with DOD, Dr Excell Seltzer re BMET obtained today.  Dr Excell Seltzer advises pt stop HCTZ. ?Start Coreg 3.125mg  - 1 tablet by mouth bid ?Kdur - 1 tablet by mouth daily x 3 days.  Repeat BMET in 1 week. ?Increase fluids ?Referral to PharmD HTN next week appt. ? ? ?

## 2021-09-29 ENCOUNTER — Telehealth: Payer: Self-pay

## 2021-09-29 ENCOUNTER — Other Ambulatory Visit
Admission: RE | Admit: 2021-09-29 | Discharge: 2021-09-29 | Disposition: A | Discharge status: Home / Self Care | Payer: Medicare PPO | Attending: Cardiovascular Disease | Admitting: Cardiovascular Disease

## 2021-09-29 DIAGNOSIS — Z79899 Other long term (current) drug therapy: Secondary | ICD-10-CM | POA: Insufficient documentation

## 2021-09-29 DIAGNOSIS — N183 Chronic kidney disease, stage 3 unspecified: Secondary | ICD-10-CM | POA: Insufficient documentation

## 2021-09-29 DIAGNOSIS — I10 Essential (primary) hypertension: Secondary | ICD-10-CM | POA: Insufficient documentation

## 2021-09-29 LAB — BASIC METABOLIC PANEL
Anion gap: 11 (ref 5–15)
BUN: 56 mg/dL — ABNORMAL HIGH (ref 8–23)
CO2: 26 mmol/L (ref 22–32)
Calcium: 9 mg/dL (ref 8.9–10.3)
Chloride: 104 mmol/L (ref 98–111)
Creatinine, Ser: 2.37 mg/dL — ABNORMAL HIGH (ref 0.61–1.24)
GFR, Estimated: 27 mL/min — ABNORMAL LOW (ref >60)
Glucose, Bld: 127 mg/dL — ABNORMAL HIGH (ref 70–99)
Potassium: 3 mmol/L — ABNORMAL LOW (ref 3.5–5.1)
Sodium: 141 mmol/L (ref 135–145)

## 2021-09-29 MED ORDER — POTASSIUM CHLORIDE CRYS ER 20 MEQ PO TBCR: 20.0000 meq | EXTENDED_RELEASE_TABLET | Freq: Every day | ORAL | 3 refills | Status: DC

## 2021-09-29 NOTE — Telephone Encounter (Signed)
-----   Message from Tonny Bollman, MD sent at 09/29/2021  4:05 PM EDT ----- I was DOD last week and stopped his HCTZ because of 5/15 lab result. Creatinine essentially unchanged, K unchanged after taking KDur 20 meq daily x 3 days. Pt to see PharmD Clinic provider later this week. He should start take KDur 20 meq BID x 2 days, then 20 meq daily. Lab follow-up per PharmD clinic. Copy Dr Tresa Endo.

## 2021-09-29 NOTE — Telephone Encounter (Signed)
The patient's has been notified of the result and verbalized understanding.  All questions (if any) were answered. Theresia Majors, RN 09/29/2021 4:48 PM  Rx has been sent in for Kdur 20 meq daily. Wife is aware that he will need to take BID for two days then start once daily.

## 2021-10-01 ENCOUNTER — Ambulatory Visit (INDEPENDENT_AMBULATORY_CARE_PROVIDER_SITE_OTHER): Payer: Medicare PPO | Admitting: Pharmacist Clinician (PhC)/ Clinical Pharmacy Specialist

## 2021-10-01 DIAGNOSIS — I10 Essential (primary) hypertension: Secondary | ICD-10-CM | POA: Diagnosis not present

## 2021-10-01 NOTE — Progress Notes (Signed)
10/07/2021 Elic S Masden November 01, 1935 [id number]   HPI:  Jorge Cherry is a 86 y.o. male patient of Dr Caryl Comes, with a PMH below who presents today for hypertension clinic evaluation.  He was in to see Dr. Caryl Comes last month at which time it was noted his BP was 160/80.  He increased amlodipine from 5 to 10 mg daily and continue with home monitoring.  Two weeks later home BP readings were still elevated 99991111 systolic, so hctz 25 mg daily was added.  After a month, BMET showed potassium down to 3.0 (from 3.9) and SCr jumped from 1.59 to 2.43.  He was advised to d/c HCTZ and start carvedilol 3.125 bid in its place.  He was also told to take KCl 20 mEq daily for 3 days.  A repeat BMET one week later showed only slight improvement in SCr to 2.37 and no change in potassium.  He was given 2 additional days of bid potassium then asked to continue with 20 mEq daily.    Today he is here for follow up.  He does not have any blood pressure readings with him in the office, but they note that his home readings are usually lower.  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  They state home readings as low as [id number] systolic and never over [id number].  Watch for bradycardia   Past Medical History: hyperlipidemia On ezetimibe 10 mg   AF Persistent - CHADS2-VASc 7 on Eliquis  CAD 1994 CABG , 2003 DES  to proximal vein graft to diagonal;   CHF 2/23 EF at 99991111, grade 2 diastolic dysfunction  CVA 2/23, CT negative but had double vision, slurred speech, gait disturbance  CKD Most recent GFR at 27  Junctional bradycardia      Blood Pressure Goal:  130/80  Current Medications: amlodipine 10 mg qd, carvedilol 3.125 mg bid,   Social Hx: no tobacco, no alcohol, decaf coffee, sweet tea, only occasional cola  Diet: eat out regularly, avoids greasy burgers and such, usually sit down restaurant - no added salt , does eat vegetables   Exercise: active around the property  Home BP readings:    Intolerances:  ACEI - angioedema  Labs: 09/29/21:  Na 141, K 3, Glu 127, BUN 56, SCr 2.37, GFR 27   Wt Readings from Last 3 Encounters:  10/01/21 184 lb 6.4 oz (83.6 kg)  08/14/21 186 lb (84.4 kg)  07/21/21 187 lb 3.2 oz (84.9 kg)   BP Readings from Last 3 Encounters:  10/01/21 140/76  08/14/21 (!) 160/80  07/21/21 (!) 160/84   Pulse Readings from Last 3 Encounters:  10/01/21 63  08/14/21 83  07/21/21 61    Current Outpatient Medications  Medication Sig Dispense Refill   albuterol (VENTOLIN HFA) 108 (90 Base) MCG/ACT inhaler 2 inhalations every 4 - 6 hours as needed 8.5 each 3   amLODipine (NORVASC) 10 MG tablet Take 1 tablet (10 mg total) by mouth daily. 90 tablet 3   apixaban (ELIQUIS) 2.5 MG TABS tablet Take 1 tablet (2.5 mg total) by mouth 2 (two) times daily. 180 tablet 3   atorvastatin (LIPITOR) 20 MG tablet Take 1 tablet (20 mg total) by mouth at bedtime. 30 tablet 0   carvedilol (COREG) 3.125 MG tablet Take 1 tablet (3.125 mg total) by mouth 2 (two) times daily with a meal. 180 tablet 1   docusate sodium (COLACE) 100 MG capsule Take 100 mg by mouth  2 (two) times daily as needed for mild constipation.     DYMISTA 137-50 MCG/ACT SUSP Place 1-2 sprays into both nostrils daily as needed.     ezetimibe (ZETIA) 10 MG tablet TAKE 1 TABLET BY MOUTH EVERY DAY 90 tablet 3   fenofibrate (TRICOR) 145 MG tablet TAKE 1/2 TABLETS BY MOUTH DAILY. 45 tablet 11   finasteride (PROSCAR) 5 MG tablet Take 5 mg by mouth daily.     fluticasone (FLONASE) 50 MCG/ACT nasal spray Place 2 sprays into both nostrils daily as needed for allergies or rhinitis.      furosemide (LASIX) 40 MG tablet Take 1 tablet (40 mg) in the morning. You may take an extra 1/2 tablet (20 mg) in the evening as needed for swelling. 90 tablet 3   ipratropium (ATROVENT) 0.03 % nasal spray Place 2 sprays into both nostrils 2 (two) times daily. 30 mL 11   isosorbide mononitrate (IMDUR) 30 MG 24 hr tablet Take 1 tablet (30 mg  total) by mouth daily. 90 tablet 3   levothyroxine (SYNTHROID) 25 MCG tablet Take 25 mcg by mouth daily before breakfast.      loratadine (CLARITIN) 10 MG tablet Take 1 tablet (10 mg total) by mouth 2 (two) times daily as needed for allergies (Can use an extra dose during flares). 60 tablet 11   Multiple Vitamins-Minerals (CENTRUM SILVER 50+MEN) TABS Take 1 tablet by mouth at bedtime.     nitroGLYCERIN (NITROLINGUAL) 0.4 MG/SPRAY spray PLACE 1 SPRAY UNDER THE TONGUE EVERY 5 (FIVE) MINUTES X 3 DOSES AS NEEDED FOR CHEST PAIN. 12 g 1   Omega-3 Fatty Acids (FISH OIL) 1000 MG CAPS Take 1,000 mg by mouth 2 (two) times daily.      pantoprazole (PROTONIX) 40 MG tablet Take 1 tablet (40 mg total) by mouth daily. 30 tablet 11   potassium chloride SA (KLOR-CON M) 20 MEQ tablet Take 1 tablet (20 mEq total) by mouth daily. 90 tablet 3   ranolazine (RANEXA) 500 MG 12 hr tablet TAKE 1 TABLET BY MOUTH TWICE A DAY 180 tablet 2   vitamin C (ASCORBIC ACID) 500 MG tablet Take 500 mg by mouth at bedtime.      vitamin E 400 UNIT capsule Take 400 Units by mouth daily.     zinc gluconate 50 MG tablet Take 50 mg by mouth daily.     No current facility-administered medications for this visit.    Allergies  Allergen Reactions   Ramipril Swelling and Other (See Comments)    Mouth swelling Other reaction(s): Tongue swelling?   Guaifenesin Er Hives, Swelling and Other (See Comments)    Mouth swelling Other reaction(s): swelling of tongue   Azithromycin     Other reaction(s): Does not work Other reaction(s): Does not work   Iodinated [employer] Other (See Comments)    Made eyes change each time Other reaction(s): Unknown    Past Medical History:  Diagnosis Date   Arthritis    "just a touch in my hands" (11/24/2016)   Asthma    Atherosclerosis of renal artery ([place])    RENAL DOPPLER, 12/10/2011 - Left renal artery demonstrated narrowing with elevated velocities consistent with a 1-59% diameter reduction   CKD  (chronic kidney disease) stage 3, GFR 30-59 ml/min (HCC)    "stable now since they backed off the water pills" (11/24/2016)   Coronary artery disease    a. 1994 s/p CABG x 4 (LIMA-LAD, VG->D2, VG->OM, VG->RCA); b. 02/2004 PCI SVG-D2 (3.5x16 Taxus DES).  VG->RCA 100. Sev apical LAD dzs distal to LIMA insertion; c. 11/2016 Cath: LAD 95p/168m, D2 100ost, LCX 100ost, 70p/m, RCA 100p/m, RPDA fills via L->R collats. VG->RCA 100, VG->D2 20 ost, patent prox stent, 85m, LIMA->LAD ok, VG->OM3 20p. EF 55%-->Med Rx.   GERD (gastroesophageal reflux disease)    High cholesterol    History of lower GI bleeding    a. 09/2014 GIB due to diverticulosis/diverticulitis.   Iron deficiency anemia    Labile Hypertension    Moderate aortic stenosis    a. 10/2011 Echo:  EF >55%, mild-mod TR, mild-mod AS, mod Ca2+ of AoV leaflets; b. 07/2016 Echo: EF 60-65%, no rwma, Gr1 DD, mod AS [(S) mean grad 29mmHg, peak grad 37mmHg. Valve area (VTI): 1.33cm^2, (Vmax) 1.44cm^2. Mild MR]; c. 02/2018 Echo: EF 55-60%, no rwma, GR1 DD, Mod AS [Peak Vel (S): 319cm/s, Mean grad (S) 15mmHg, peak grad (S) 81mmHg].   PAF (paroxysmal atrial fibrillation) (HCC)    a. CHA2DS2VASc = 4-->Eliquis.   S/P TAVR (transcatheter aortic valve replacement) 11/14/2019   s/p TAVR with a 26 mm Edwards S3U via the TF approach by Drs Angelena Form & Bartle   Sleep apnea     Blood pressure 140/76, pulse 63, resp. rate 15, height 5\' 8"  (1.727 m), weight 184 lb 6.4 oz (83.6 kg), SpO2 98 %.  Essential hypertension Patient with essential hypertension, with elevated in office readings.  Home reading (per patient) are more WNL.  Will have him continue with current medications for now.  He should continue to monitor home readings for now and can reach out to the office should they start trending upward.  He is scheduled to see Dr. Claiborne Billings in July.    Tommy Medal PharmD CPP [facility] [address] [place], [place]  [address] [phone]

## 2021-10-01 NOTE — Patient Instructions (Signed)
  Go to the lab in 7-10 days to check kidney function and potassium levels  Check your blood pressure at home daily and keep record of the readings.  Take your BP meds as follows:  Continue with your current medications for now.  Send me your home BP readings in the next day or two.  You can send them via MyChart or email them to me at [email or web address].  If you send them via email, please send a quick MyChart message to me and let me know, as sometimes outside emails will go to the junk email box.  Bring all of your meds, your BP cuff and your record of home blood pressures to your next appointment.  Exercise as you're able, try to walk approximately 30 minutes per day.  Keep salt intake to a minimum, especially watch canned and prepared boxed foods.  Eat more fresh fruits and vegetables and fewer canned items.  Avoid eating in fast food restaurants.    HOW TO TAKE YOUR BLOOD PRESSURE: Rest 5 minutes before taking your blood pressure.  Don't smoke or drink caffeinated beverages for at least 30 minutes before. Take your blood pressure before (not after) you eat. Sit comfortably with your back supported and both feet on the floor (don't cross your legs). Elevate your arm to heart level on a table or a desk. Use the proper sized cuff. It should fit smoothly and snugly around your bare upper arm. There should be enough room to slip a fingertip under the cuff. The bottom edge of the cuff should be 1 inch above the crease of the elbow. Ideally, take 3 measurements at one sitting and record the average.

## 2021-10-02 ENCOUNTER — Ambulatory Visit: Payer: Medicare PPO

## 2021-10-05 ENCOUNTER — Telehealth: Payer: Self-pay | Admitting: Physician Assistant

## 2021-10-05 NOTE — Telephone Encounter (Signed)
Paged by answering service.  Recent blood pressure issue.  Reviewed notes.  Seen by pharmacist 5/24.  This note is pending.  Had worsening renal function on hydrochlorothiazide which has discontinued.  Wife was calling to get instruction on potassium supplement  I have recommended to K-Dur 20 mEq daily as previously instructed.  She has follow-up with PCP 5/30.  She will need blood work at that time.  Currently blood pressure 140-150/80-90s.  About 10 days ago his systolic blood pressure was in 110s.  Patient is moving and under stress.  I have recommended to keep log and reviewed with provider during next visit.

## 2021-10-07 ENCOUNTER — Encounter: Payer: Self-pay | Admitting: Pharmacist Clinician (PhC)/ Clinical Pharmacy Specialist

## 2021-10-07 DIAGNOSIS — I5032 Chronic diastolic (congestive) heart failure: Secondary | ICD-10-CM | POA: Diagnosis not present

## 2021-10-07 DIAGNOSIS — N1831 Chronic kidney disease, stage 3a: Secondary | ICD-10-CM | POA: Diagnosis not present

## 2021-10-07 DIAGNOSIS — I130 Hypertensive heart and chronic kidney disease with heart failure and stage 1 through stage 4 chronic kidney disease, or unspecified chronic kidney disease: Secondary | ICD-10-CM | POA: Diagnosis not present

## 2021-10-07 DIAGNOSIS — I25810 Atherosclerosis of coronary artery bypass graft(s) without angina pectoris: Secondary | ICD-10-CM | POA: Diagnosis not present

## 2021-10-07 DIAGNOSIS — I480 Paroxysmal atrial fibrillation: Secondary | ICD-10-CM | POA: Diagnosis not present

## 2021-10-07 NOTE — Assessment & Plan Note (Signed)
Patient with essential hypertension, with elevated in office readings.  Home reading (per patient) are more WNL.  Will have him continue with current medications for now.  He should continue to monitor home readings for now and can reach out to the office should they start trending upward.  He is scheduled to see Dr. Tresa Endo in July.

## 2021-10-13 ENCOUNTER — Other Ambulatory Visit
Admission: RE | Admit: 2021-10-13 | Discharge: 2021-10-13 | Disposition: A | Discharge status: Home / Self Care | Payer: Medicare PPO | Attending: Internal Medicine | Admitting: Internal Medicine

## 2021-10-13 DIAGNOSIS — Z79899 Other long term (current) drug therapy: Secondary | ICD-10-CM | POA: Insufficient documentation

## 2021-10-13 DIAGNOSIS — I10 Essential (primary) hypertension: Secondary | ICD-10-CM | POA: Diagnosis not present

## 2021-10-13 LAB — BASIC METABOLIC PANEL
Anion gap: 3 — ABNORMAL LOW (ref 5–15)
BUN: 27 mg/dL — ABNORMAL HIGH (ref 8–23)
CO2: 20 mmol/L — ABNORMAL LOW (ref 22–32)
Calcium: 8.9 mg/dL (ref 8.9–10.3)
Chloride: 117 mmol/L — ABNORMAL HIGH (ref 98–111)
Creatinine, Ser: 1.44 mg/dL — ABNORMAL HIGH (ref 0.61–1.24)
GFR, Estimated: 48 mL/min — ABNORMAL LOW (ref >60)
Glucose, Bld: 113 mg/dL — ABNORMAL HIGH (ref 70–99)
Potassium: 4.3 mmol/L (ref 3.5–5.1)
Sodium: 140 mmol/L (ref 135–145)

## 2021-10-30 ENCOUNTER — Ambulatory Visit (HOSPITAL_COMMUNITY)
Admission: RE | Admit: 2021-10-30 | Discharge: 2021-10-30 | Disposition: A | Discharge status: Home / Self Care | Payer: Medicare PPO | Source: Ambulatory Visit | Attending: Gastroenterology | Admitting: Gastroenterology

## 2021-10-30 DIAGNOSIS — N281 Cyst of kidney, acquired: Secondary | ICD-10-CM | POA: Diagnosis not present

## 2021-10-30 DIAGNOSIS — K862 Cyst of pancreas: Secondary | ICD-10-CM

## 2021-10-30 DIAGNOSIS — K5730 Diverticulosis of large intestine without perforation or abscess without bleeding: Secondary | ICD-10-CM | POA: Diagnosis not present

## 2021-10-30 IMAGING — MR MR ABDOMEN WO/W CM
20 of 23 series · 43 of 48 positions shown · IV contrast (gadavist)
Comparison: MRI abdomen [DATE]

CLINICAL DATA: Pancreatic cyst follow-up

EXAM:
MRI ABDOMEN WITHOUT AND WITH CONTRAST
TECHNIQUE: Multiplanar multisequence MR imaging of the abdomen was performed
both before and after the administration of intravenous contrast.
CONTRAST:  8mL GADAVIST GADOBUTROL 1 MMOL/ML IV SOLN

[Series 3: cor haste · coronal · 6.0mm · 1.31mm/px · 1 of 42 slices shown]
[im 1/42]
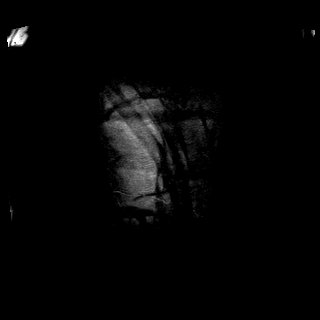

[Series 4: ax haste · axial · 6.0mm · 1.31mm/px · 1 of 38 slices shown]
[im 1/38]
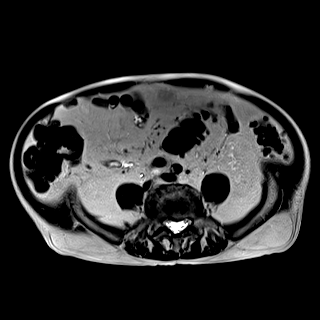

[Series 7: T2 fat-sat · axial · 6.0mm · 1.31mm/px · 1 of 36 slices shown]
[im 1/36]
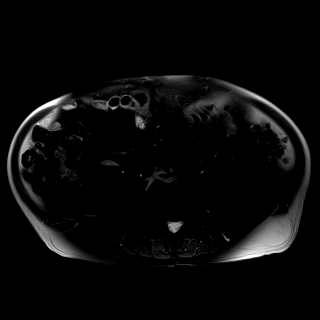

[Series 8: t1_vibe_opp-in_tra_p4_bh · axial · 3.0mm · 1.31mm/px · z∈[-225,+36]mm · 2 of 88 slices shown (1 of 2)]
[im 1/88]
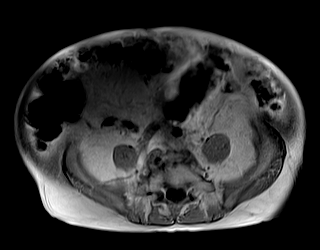
[im 88/88]
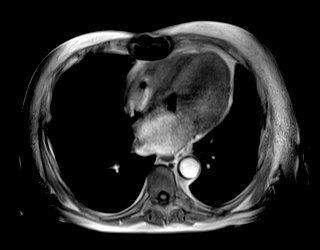

[Series 8: t1_vibe_opp-in_tra_p4_bh · axial · 3.0mm · 1.31mm/px · z∈[-225,+36]mm · 2 of 88 slices shown (2 of 2)]
[im 1/88]
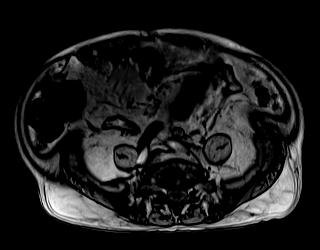
[im 88/88]
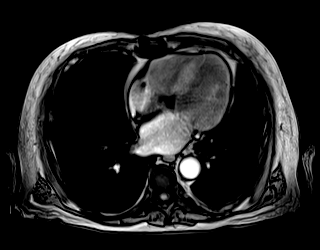

[Series 9: DWI · axial · 6.0mm · 1.57mm/px · z∈[-200,+52]mm · 2 of 108 slices shown (1 of 2)]
[im 1/108]
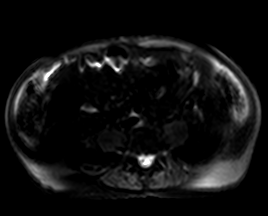
[im 108/108]
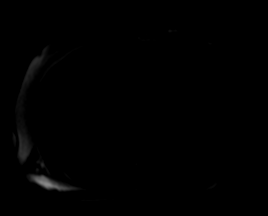

[Series 10: DWI · axial · 6.0mm · 1.57mm/px · 1 of 36 slices shown (2 of 2)]
[im 1/36]
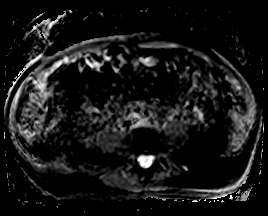

[Series 11: bSSFP · axial · 6.0mm · 0.82mm/px · 1 of 34 slices shown]
[im 1/34]
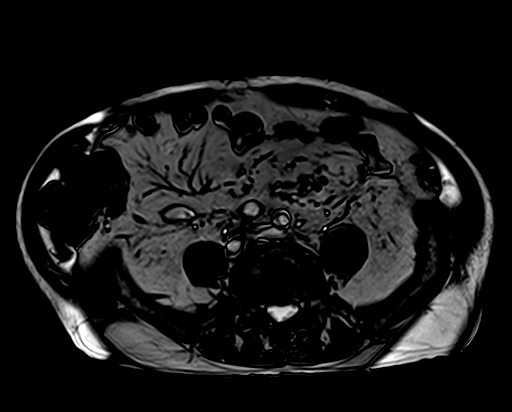

[Series 18: MRCP · coronal · 4.0mm · 1.25mm/px · 1 of 22 slices shown]
[im 1/22]
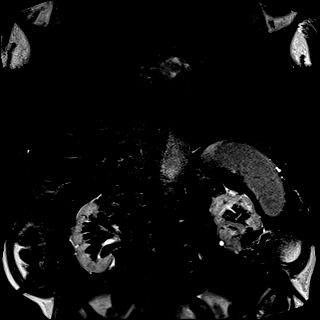

[Series 19: radials · coronal · 50.0mm · 0.78mm/px · 1 of 5 slices shown]
[im 1/5]
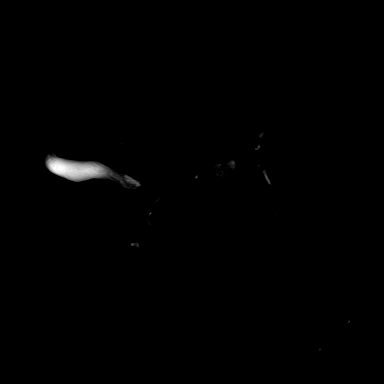

[Series 20: t1_vibe_fs_tra_p4_bh_pre · axial · 3.0mm · 1.31mm/px · z∈[-225,+36]mm · 3 of 88 slices shown]
[im 1/88]
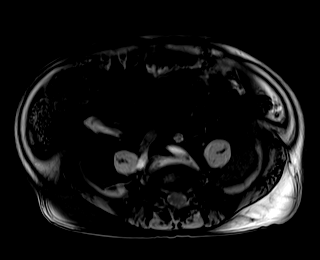
[im 44/88]
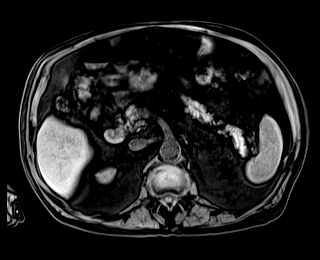
[im 88/88]
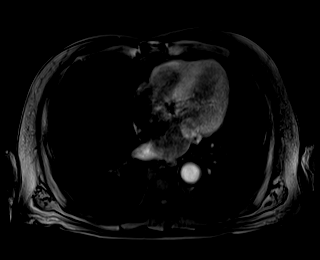

[Series 22: t1_vibe_fs_tra_p4_bh_post · axial · 3.0mm · 1.31mm/px · z∈[-225,+36]mm · 3 of 88 slices shown (1 of 4)]
[im 1/88]
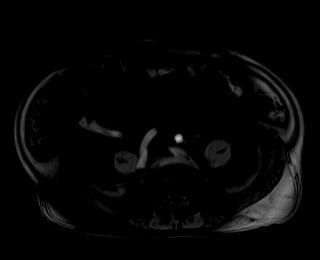
[im 44/88]
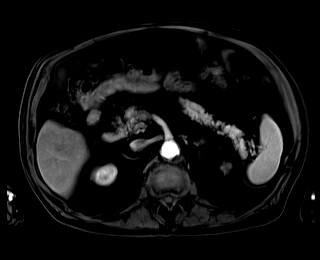
[im 88/88]
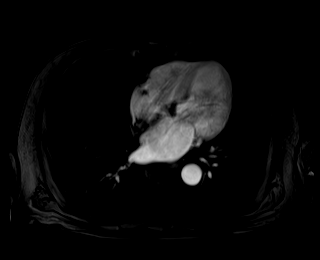

[Series 23: t1_vibe_fs_tra_p4_bh_post_sub · axial · 3.0mm · 1.31mm/px · z∈[-225,+36]mm · 3 of 88 slices shown (1 of 4)]
[im 1/88]
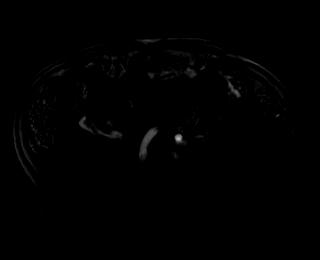
[im 44/88]
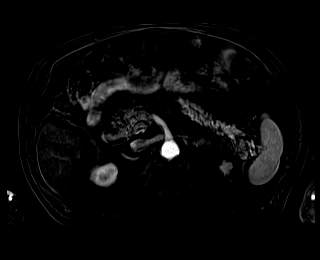
[im 88/88]
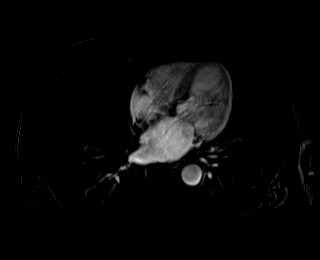

[Series 24: t1_vibe_fs_tra_p4_bh_post · axial · 3.0mm · 1.31mm/px · z∈[-225,+36]mm · 3 of 88 slices shown (2 of 4)]
[im 1/88]
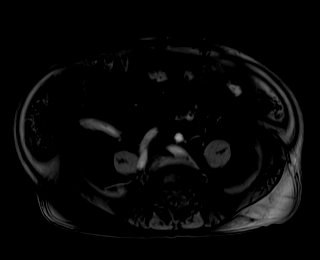
[im 44/88]
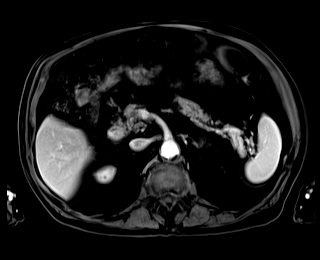
[im 88/88]
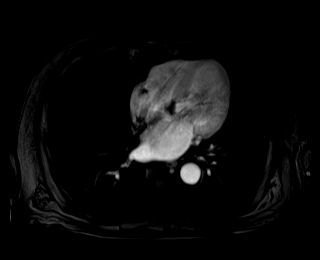

[Series 25: t1_vibe_fs_tra_p4_bh_post_sub · axial · 3.0mm · 1.31mm/px · z∈[-225,+36]mm · 3 of 88 slices shown (2 of 4)]
[im 1/88]
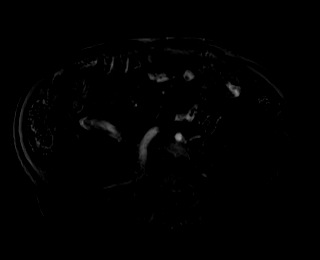
[im 44/88]
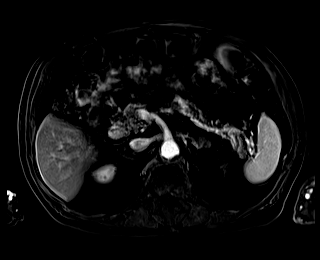
[im 88/88]
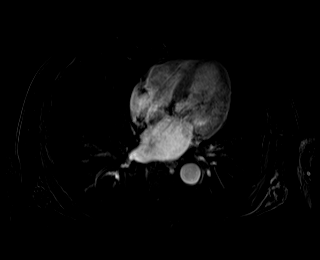

[Series 26: t1_vibe_fs_tra_p4_bh_post · axial · 3.0mm · 1.31mm/px · z∈[-225,+36]mm · 3 of 88 slices shown (3 of 4)]
[im 1/88]
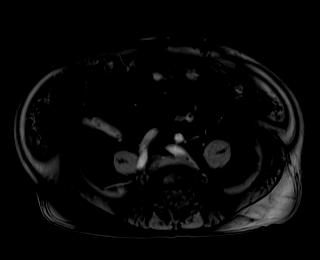
[im 44/88]
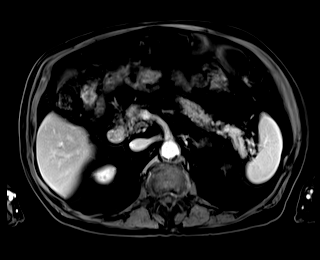
[im 88/88]
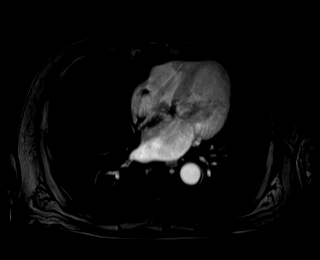

[Series 27: t1_vibe_fs_tra_p4_bh_post_sub · axial · 3.0mm · 1.31mm/px · z∈[-225,+36]mm · 3 of 88 slices shown (3 of 4)]
[im 1/88]
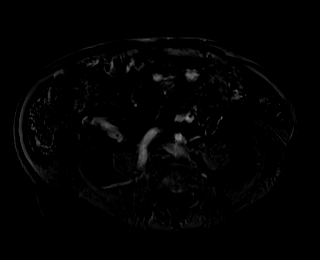
[im 44/88]
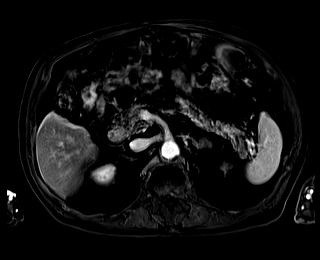
[im 88/88]
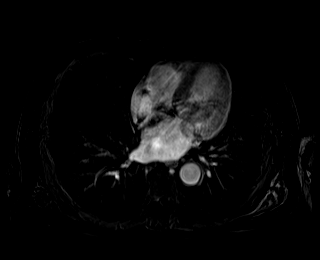

[Series 28: t1_vibe_fs_tra_p4_bh_post · axial · 3.0mm · 1.31mm/px · z∈[-225,+36]mm · 3 of 88 slices shown (4 of 4)]
[im 1/88]
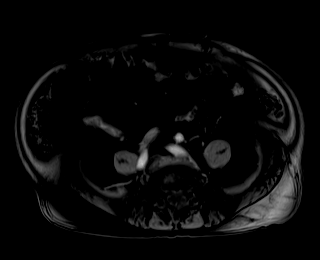
[im 44/88]
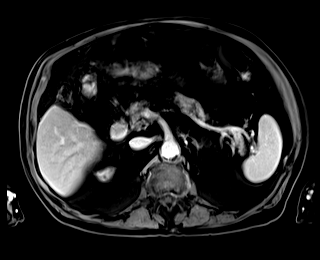
[im 88/88]
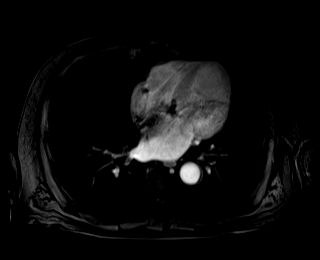

[Series 29: t1_vibe_fs_tra_p4_bh_post_sub · axial · 3.0mm · 1.31mm/px · z∈[-225,+36]mm · 3 of 88 slices shown (4 of 4)]
[im 1/88]
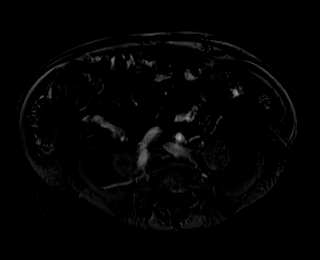
[im 44/88]
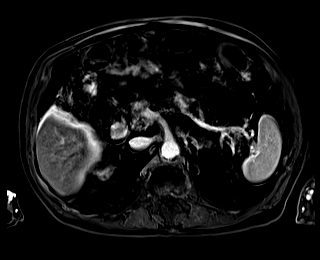
[im 88/88]
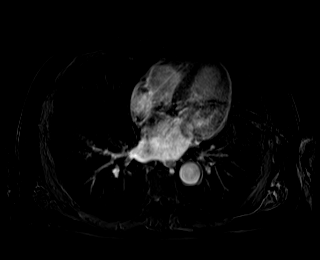

[Series 30: T1 dynamic post-contrast · coronal · 3.0mm · 1.31mm/px · 3 of 88 slices shown]
[im 1/88]
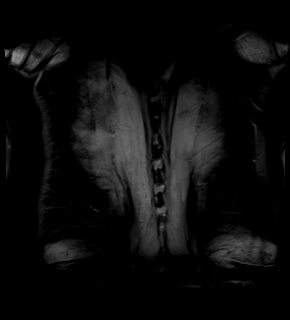
[im 44/88]
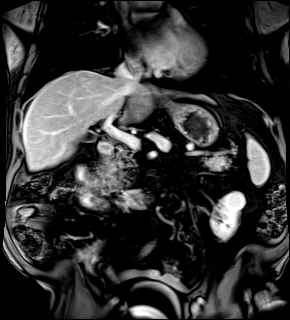
[im 88/88]
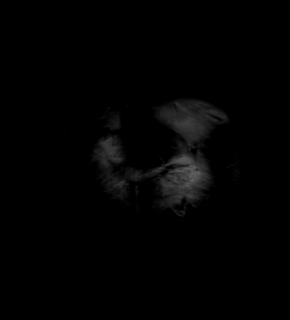

[43 of 48 positions shown; findings below may reference images not displayed]

FINDINGS: Limited study due to motion.

Lower chest: No acute findings.

Hepatobiliary: Liver is normal in size and contour with no
suspicious mass identified. Gallbladder appears normal. No biliary
ductal dilatation.

Pancreas: A few stable pancreatic cystic lesions are identified
measuring 8 mm in the uncinate process, 20 x 12 mm in the body and 5
mm in the more inferior body. No suspicious enhancement visualized
within the cysts. Main pancreatic duct is normal caliber.

Spleen:  Within normal limits in size and appearance.

Adrenals/Urinary Tract: Adrenal glands are normal. Multiple simple
and hemorrhagic cysts are again seen in the kidneys, measuring up to
2.5 cm anteriorly on the left. No hydronephrosis.

Stomach/Bowel: Colonic diverticulosis. No evidence of bowel
obstruction.

Vascular/Lymphatic: No pathologically enlarged lymph nodes
identified. No abdominal aortic aneurysm demonstrated.

Other:  No ascites.

Musculoskeletal: No suspicious bone lesions identified.
IMPRESSION: 1. Stable pancreatic cystic lesions as described, largest measuring
up to 2 x 1.2 cm. No new suspicious features identified. Recommend
follow-up MRI abdomen with contrast in 2 years.
2. Multiple simple and hemorrhagic renal cysts.
3. Colonic diverticulosis.

## 2021-10-30 MED ORDER — GADOBUTROL 1 MMOL/ML IV SOLN
8.0000 mL | Freq: Once | INTRAVENOUS | Status: AC | PRN
  Administered 2021-10-30: 8 mL via INTRAVENOUS

## 2021-10-30 NOTE — Progress Notes (Signed)
Per order, Changed device settings for MRI to VOO at 90 bpm Will program device back to pre-MRI settings after completion of exam, and send transmission.  

## 2021-11-05 ENCOUNTER — Telehealth: Payer: Self-pay | Admitting: Cardiovascular Disease

## 2021-11-05 NOTE — Telephone Encounter (Signed)
Spoke to patient and wife.Wife is concerned husband had a episode of dizziness and blurred vision this afternoon that lasted appox 10 to 15 mins.No chest pain.No sob. Stated he feels fine now.Stated he thinks he got too hot.Stated episode reminds him of a TIA he had before.Appointment scheduled with Edd Fabian NP 7/3 at 2:45 pm.Advised if he has anymore episodes he needs to go to ED.I will make Dr.Kelly aware.

## 2021-11-05 NOTE — Telephone Encounter (Signed)
STAT if patient feels like he/she is going to faint   Are you dizzy now? No  Do you feel faint or have you passed out? No  Do you have any other symptoms? Feels like his bp is high   Have you checked your HR and BP (record if available)?   Pt's wife was calling with patient on another phone not with her. She states that he has been feeling dizzy and off. Says he has regulated and feels a bit better, but states that his bp seems elevated. He was disconnected before he could give his bp and hr. Pt's wife is requesting call back.   [phone]

## 2021-11-09 NOTE — Progress Notes (Deleted)
Cardiology Clinic Note   Patient Name: Jorge Cherry Date of Encounter: 11/09/2021  Primary Care Provider:  Prince Solian, MD Primary Cardiologist:  Shelva Majestic, MD  Patient Profile    Jorge Cherry 86 year old male presents to the clinic today for  evaluation of his dizziness and blurred vision.  Past Medical History    Past Medical History:  Diagnosis Date   Arthritis    "just a touch in my hands" (11/24/2016)   Asthma    Atherosclerosis of renal artery ([facility])    RENAL DOPPLER, 12/10/2011 - Left renal artery demonstrated narrowing with elevated velocities consistent with a 1-59% diameter reduction   CKD (chronic kidney disease) stage 3, GFR 30-59 ml/min (HCC)    "stable now since they backed off the water pills" (11/24/2016)   Coronary artery disease    a. 1994 s/p CABG x 4 (LIMA-LAD, VG->D2, VG->OM, VG->RCA); b. 02/2004 PCI SVG-D2 (3.5x16 Taxus DES). VG->RCA 100. Sev apical LAD dzs distal to LIMA insertion; c. 11/2016 Cath: LAD 95p/159m, D2 100ost, LCX 100ost, 70p/m, RCA 100p/m, RPDA fills via L->R collats. VG->RCA 100, VG->D2 20 ost, patent prox stent, 33m, LIMA->LAD ok, VG->OM3 20p. EF 55%-->Med Rx.   GERD (gastroesophageal reflux disease)    High cholesterol    History of lower GI bleeding    a. 09/2014 GIB due to diverticulosis/diverticulitis.   Iron deficiency anemia    Labile Hypertension    Moderate aortic stenosis    a. 10/2011 Echo:  EF >55%, mild-mod TR, mild-mod AS, mod Ca2+ of AoV leaflets; b. 07/2016 Echo: EF 60-65%, no rwma, Gr1 DD, mod AS [(S) mean grad 57mmHg, peak grad 56mmHg. Valve area (VTI): 1.33cm^2, (Vmax) 1.44cm^2. Mild MR]; c. 02/2018 Echo: EF 55-60%, no rwma, GR1 DD, Mod AS [Peak Vel (S): 319cm/s, Mean grad (S) 42mmHg, peak grad (S) 42mmHg].   PAF (paroxysmal atrial fibrillation) (HCC)    a. CHA2DS2VASc = 4-->Eliquis.   S/P TAVR (transcatheter aortic valve replacement) 11/14/2019   s/p TAVR with a 26 mm Edwards S3U via the TF approach by Drs Angelena Form &  Bartle   Sleep apnea    Past Surgical History:  Procedure Laterality Date   CARDIAC CATHETERIZATION  02/27/2004   Coronary intervention and medical management   CARDIAC CATHETERIZATION  11/24/2016   CARDIOVERSION N/A 12/20/2019   Procedure: CARDIOVERSION;  Surgeon: Skeet Latch, MD;  Location: [facility];  Service: Cardiovascular;  Laterality: N/A;   CARDIOVERSION N/A 02/05/2020   Procedure: CARDIOVERSION;  Surgeon: Josue Hector, MD;  Location: [facility] ENDOSCOPY;  Service: Cardiovascular;  Laterality: N/A;   CATARACT EXTRACTION W/ INTRAOCULAR LENS IMPLANT Left    CORONARY ANGIOPLASTY  1994 X 2   "before bypass surgery"   CORONARY ANGIOPLASTY WITH STENT PLACEMENT  03/04/2004   SVG supplying the diagonal vessel stented with a 3.5x56mm Taxus stent post dilated to 4.0 mm   CORONARY ARTERY BYPASS GRAFT  1994   "CABG X4"   INGUINAL HERNIA REPAIR Right    PACEMAKER IMPLANT N/A 11/20/2019   Procedure: PACEMAKER IMPLANT;  Surgeon: Deboraha Sprang, MD;  Location: [facility] LAB;  Service: Cardiovascular;  Laterality: N/A;   RIGHT HEART CATH AND CORONARY/GRAFT ANGIOGRAPHY N/A 11/24/2016   Procedure: Right Heart Cath and Coronary/Graft Angiography;  Surgeon: Troy Sine, MD;  Location: [facility];  Service: Cardiovascular;  Laterality: N/A;   RIGHT/LEFT HEART CATH AND CORONARY/GRAFT ANGIOGRAPHY N/A 10/18/2019   Procedure: RIGHT/LEFT HEART CATH AND CORONARY/GRAFT ANGIOGRAPHY;  Surgeon: Troy Sine,  MD;  Location: [facility] INVASIVE CV LAB;  Service: Cardiovascular;  Laterality: N/A;   TEE WITHOUT CARDIOVERSION N/A 11/14/2019   Procedure: TRANSESOPHAGEAL ECHOCARDIOGRAM (TEE);  Surgeon: Kathleene Hazel, MD;  Location: [facility] INVASIVE CV LAB;  Service: Open Heart Surgery;  Laterality: N/A;   TONSILLECTOMY AND ADENOIDECTOMY     TRANSCATHETER AORTIC VALVE REPLACEMENT, TRANSFEMORAL N/A 11/14/2019   Procedure: TRANSCATHETER AORTIC VALVE REPLACEMENT, TRANSFEMORAL;  Surgeon: Kathleene Hazel, MD;  Location: [facility] INVASIVE CV LAB;  Service: Open Heart Surgery;  Laterality: N/A;    Allergies  Allergies  Allergen Reactions   Ramipril Swelling and Other (See Comments)    Mouth swelling Other reaction(s): Tongue swelling?   Guaifenesin Er Hives, Swelling and Other (See Comments)    Mouth swelling Other reaction(s): swelling of tongue   Azithromycin     Other reaction(s): Does not work Other reaction(s): Does not work   Iodinated [employer] Other (See Comments)    Made eyes change each time Other reaction(s): Unknown    History of Present Illness    Jorge Cherry has a PMH of RBBB pre-TAVR and left bundle branch block post TAVR, syncope underwent PPM implantation 7/21, permanent atrial fibrillation, chronic diastolic CHF, essential hypertension, coronary artery disease with CABG in 1994, cardiac catheterization 7/18 showed patent LIMA, SVG-OM 3, SVG-D2, and occlusion of RCA.  He underwent DCCV x2 on 8/11 and 9/21.  His sinus rhythm held for less than 24 hours.  Hospitalized 2/23 with visual changes concerning for CVA/TIA.  CHA2DS2-VASc score 6.  He was last seen by Dr. Graciela Husbands on 08/14/2021.  During that time he denied chest pain, shortness of breath, PND, lower extremity edema.  He was not noted to have palpitations lightheadedness or syncope.  He was noted to have 39% ventricular pacing.  His metoprolol was decreased.  His apixaban was decreased to 2.5 mg twice daily due to his creatinine and age.  He contacted the nurse triage line on 11/05/2021.  He reported an episode of dizziness and blurred vision that lasted for 10 to 15 minutes.  He denied chest pain and shortness of breath.  He reported that at the time of the call he was feeling fine.  The symptoms reminded him of an episode of TIA.  He presents to the clinic today for follow-up evaluation and states***  *** denies chest pain, shortness of breath, lower extremity edema, fatigue, palpitations, melena, hematuria,  hemoptysis, diaphoresis, weakness, presyncope, syncope, orthopnea, and PND.  Dizziness, blurred vision-EKG today shows***.  Reports symptoms reminded him of a previous TIA.  Compliant with apixaban blood pressure well controlled at home. Increase p.o. hydration Continue apixaban, carvedilol Plan for pacemaker interrogation-device check Ambulatory referral to neurology Order CBC, BMP  Essential hypertension-BP today***.  Well-controlled at home. Continue carvedilol, Imdur, amlodipine Heart healthy low-sodium diet-salty 6 given Increase physical activity as tolerated  Paroxysmal atrial fibrillation-heart rate today***. Continue carvedilol, apixaban Heart healthy low-sodium diet-salty 6 given Increase physical activity as tolerated Avoid triggers caffeine, chocolate, EtOH, dehydration etc.  Chronic diastolic CHF-euvolemic today.  Weight stable. Continue carvedilol, furosemide Heart healthy low-sodium diet-salty 6 given Increase physical activity as tolerated   Disposition: Follow-up with Dr. Tresa Endo in 4-6 months..  Home Medications    Prior to Admission medications   Medication Sig Start Date End Date Taking? Authorizing Provider  albuterol (VENTOLIN HFA) 108 (90 Base) MCG/ACT inhaler 2 inhalations every 4 - 6 hours as needed 03/04/21   Kozlow, Alvira Philips, MD  amLODipine (NORVASC) 10  MG tablet Take 1 tablet (10 mg total) by mouth daily. 08/14/21   Duke Salvia, MD  apixaban (ELIQUIS) 2.5 MG TABS tablet Take 1 tablet (2.5 mg total) by mouth 2 (two) times daily. 08/14/21   Duke Salvia, MD  atorvastatin (LIPITOR) 20 MG tablet Take 1 tablet (20 mg total) by mouth at bedtime. 03/16/18   Barnetta Chapel, MD  carvedilol (COREG) 3.125 MG tablet Take 1 tablet (3.125 mg total) by mouth 2 (two) times daily with a meal. 09/22/21   Tonny Bollman, MD  docusate sodium (COLACE) 100 MG capsule Take 100 mg by mouth 2 (two) times daily as needed for mild constipation.    [provider]   DYMISTA 137-50 MCG/ACT SUSP Place 1-2 sprays into both nostrils daily as needed. 04/22/21   [provider]  ezetimibe (ZETIA) 10 MG tablet TAKE 1 TABLET BY MOUTH EVERY DAY 08/12/21   Lennette Bihari, MD  fenofibrate (TRICOR) 145 MG tablet TAKE 1/2 TABLETS BY MOUTH DAILY. 08/19/20   Lennette Bihari, MD  finasteride (PROSCAR) 5 MG tablet Take 5 mg by mouth daily.    [provider]  fluticasone (FLONASE) 50 MCG/ACT nasal spray Place 2 sprays into both nostrils daily as needed for allergies or rhinitis.     [provider]  furosemide (LASIX) 40 MG tablet Take 1 tablet (40 mg) in the morning. You may take an extra 1/2 tablet (20 mg) in the evening as needed for swelling. 03/19/21   Lennette Bihari, MD  ipratropium (ATROVENT) 0.03 % nasal spray Place 2 sprays into both nostrils 2 (two) times daily. 03/04/21   Kozlow, Alvira Philips, MD  isosorbide mononitrate (IMDUR) 30 MG 24 hr tablet Take 1 tablet (30 mg total) by mouth daily. 07/21/21   Lennette Bihari, MD  levothyroxine (SYNTHROID) 25 MCG tablet Take 25 mcg by mouth daily before breakfast.  07/08/19   [provider]  loratadine (CLARITIN) 10 MG tablet Take 1 tablet (10 mg total) by mouth 2 (two) times daily as needed for allergies (Can use an extra dose during flares). 03/04/21   Kozlow, Alvira Philips, MD  Multiple Vitamins-Minerals (CENTRUM SILVER 50+MEN) TABS Take 1 tablet by mouth at bedtime.    [provider]  nitroGLYCERIN (NITROLINGUAL) 0.4 MG/SPRAY spray PLACE 1 SPRAY UNDER THE TONGUE EVERY 5 (FIVE) MINUTES X 3 DOSES AS NEEDED FOR CHEST PAIN. 08/06/20   Lennette Bihari, MD  Omega-3 Fatty Acids (FISH OIL) 1000 MG CAPS Take 1,000 mg by mouth 2 (two) times daily.     [provider]  pantoprazole (PROTONIX) 40 MG tablet Take 1 tablet (40 mg total) by mouth daily. 03/04/21   Kozlow, Alvira Philips, MD  potassium chloride SA (KLOR-CON M) 20 MEQ tablet Take 1 tablet (20 mEq total) by mouth daily. 09/29/21   Tonny Bollman, MD  ranolazine (RANEXA) 500 MG 12 hr tablet TAKE 1 TABLET BY MOUTH TWICE A DAY 12/06/20   Lennette Bihari, MD  vitamin C (ASCORBIC ACID) 500 MG tablet Take 500 mg by mouth at bedtime.     [provider]  vitamin E 400 UNIT capsule Take 400 Units by mouth daily.    [provider]  zinc gluconate 50 MG tablet Take 50 mg by mouth daily.    [provider]    Family History    Family History  Problem Relation Age of Onset   Cancer Mother    Heart disease Father  died age 54   Stroke Maternal Grandmother    Cancer Maternal Grandfather    Allergic rhinitis Neg Hx    Angioedema Neg Hx    Asthma Neg Hx    Atopy Neg Hx    Eczema Neg Hx    Immunodeficiency Neg Hx    Urticaria Neg Hx    He indicated that his mother is deceased. He indicated that his father is deceased. He indicated that his maternal grandmother is deceased. He indicated that his maternal grandfather is deceased. He indicated that the status of his neg hx is unknown.  Social History    Social History   Socioeconomic History   Marital status: Married    Spouse name: Not on file   Number of children: 2   Years of education: Not on file   Highest education level: Not on file  Occupational History   Not on file  Tobacco Use   Smoking status: Never   Smokeless tobacco: Never  Vaping Use   Vaping Use: Never used  Substance and Sexual Activity   Alcohol use: No    Alcohol/week: 0.0 standard drinks of alcohol   Drug use: No   Sexual activity: Not Currently  Other Topics Concern   Not on file  Social History Narrative   Lives at home in [place] with wife.  Retired from [employer].     Social Determinants of Health   Financial Resource Strain: Not on file  Food Insecurity: Not on file  Transportation Needs: Not on file  Physical Activity: Not on file  Stress: Not on file  Social Connections: Not on file  Intimate Partner Violence: Not on file      Review of Systems    General:  No chills, fever, night sweats or weight changes.  Cardiovascular:  No chest pain, dyspnea on exertion, edema, orthopnea, palpitations, paroxysmal nocturnal dyspnea. Dermatological: No rash, lesions/masses Respiratory: No cough, dyspnea Urologic: No hematuria, dysuria Abdominal:   No nausea, vomiting, diarrhea, bright red blood per rectum, melena, or hematemesis Neurologic:  No visual changes, wkns, changes in mental status. All other systems reviewed and are otherwise negative except as noted above.  Physical Exam    VS:  There were no vitals taken for this visit. , BMI There is no height or weight on file to calculate BMI. GEN: Well nourished, well developed, in no acute distress. HEENT: normal. Neck: Supple, no JVD, carotid bruits, or masses. Cardiac: RRR, no murmurs, rubs, or gallops. No clubbing, cyanosis, edema.  Radials/DP/PT 2+ and equal bilaterally.  Respiratory:  Respirations regular and unlabored, clear to auscultation bilaterally. GI: Soft, nontender, nondistended, BS + x 4. MS: no deformity or atrophy. Skin: warm and dry, no rash. Neuro:  Strength and sensation are intact. Psych: Normal affect.  Accessory Clinical Findings    Recent Labs: 06/28/2021: ALT 22 06/29/2021: TSH 1.671 07/11/2021: Hemoglobin 13.7; Platelets 146 10/13/2021: BUN 27; Creatinine, Ser 1.44; Potassium 4.3; Sodium 140   Recent Lipid Panel    Component Value Date/Time   CHOL 108 07/11/2021 0927   CHOL 137 04/09/2014 1115   TRIG 123 07/11/2021 0927   TRIG 182 (H) 04/09/2014 1115   HDL 38 (L) 07/11/2021 0927   HDL 32 (L) 04/09/2014 1115   CHOLHDL 2.8 07/11/2021 0927   CHOLHDL 3.2 06/28/2021 2349   VLDL 14 06/28/2021 2349   LDLCALC 48 07/11/2021 0927   LDLCALC 69 04/09/2014 1115    ECG personally reviewed by me today- *** -  No acute changes  Echocardiogram 11/07/2020  IMPRESSIONS     1. The aortic valve has been repaired/replaced. Aortic valve   regurgitation is trivial. No aortic stenosis is present. There is a 26 mm  Edwards Sapien prosthetic (TAVR) valve present in the aortic position.  Procedure Date: 11/14/2019. Echo findings are  consistent with normal structure and function of the aortic valve  prosthesis. Aortic valve mean gradient measures 14.3 mmHg. Aortic valve  Vmax measures 2.44 m/s. Aortic valve acceleration time measures 92 msec.   2. Left ventricular ejection fraction, by estimation, is 50 to 55%. The  left ventricle has low normal function. Left ventricular endocardial  border not optimally defined to evaluate regional wall motion. There is  mild left ventricular hypertrophy. Left  ventricular diastolic parameters are consistent with Grade II diastolic  dysfunction (pseudonormalization).   3. Right ventricular systolic function is mildly reduced. The right  ventricular size is moderately enlarged. There is normal pulmonary artery  systolic pressure. The estimated right ventricular systolic pressure is  [id number] mmHg.   4. Left atrial size was severely dilated.   5. Right atrial size was mild to moderately dilated.   6. The mitral valve is normal in structure. Mild mitral valve  regurgitation. No evidence of mitral stenosis.   7. The inferior vena cava is dilated in size with >50% respiratory  variability, suggesting right atrial pressure of 8 mmHg.   Comparison(s): A prior study was performed on 01/03/20. No significant  change from prior study despite differences in reporting. Prior images  reviewed side by side.   Assessment & Plan   1.  ***   Jossie Ng. Sharika Mosquera NP-C    11/09/2021, 12:51 PM [facility] [address] Office [phone] Fax [phone]  Notice: This dictation was prepared with Dragon dictation along with smaller phrase technology. Any transcriptional errors that result from this process are unintentional and may not be corrected upon review.  I  spent***minutes examining this patient, reviewing medications, and using patient centered shared decision making involving her cardiac care.  Prior to her visit I spent greater than 20 minutes reviewing her past medical history,  medications, and prior cardiac tests.

## 2021-11-10 ENCOUNTER — Ambulatory Visit: Payer: Medicare PPO | Admitting: General Practice

## 2021-11-11 ENCOUNTER — Other Ambulatory Visit: Payer: Self-pay | Admitting: Cardiovascular Disease

## 2021-11-12 ENCOUNTER — Other Ambulatory Visit: Payer: Self-pay | Admitting: Cardiovascular Disease

## 2021-11-20 ENCOUNTER — Ambulatory Visit (INDEPENDENT_AMBULATORY_CARE_PROVIDER_SITE_OTHER): Payer: Medicare PPO

## 2021-11-20 DIAGNOSIS — I4821 Permanent atrial fibrillation: Secondary | ICD-10-CM

## 2021-11-21 LAB — CUP PACEART REMOTE DEVICE CHECK
Battery Remaining Longevity: 142 mo
Battery Voltage: 3.01 V
Brady Statistic RA Percent Paced: 0 %
Brady Statistic RV Percent Paced: 29.38 %
Date Time Interrogation Session: 20230713061258
Implantable Lead Implant Date: 20210712
Implantable Lead Implant Date: 20210712
Implantable Lead Location: 753859
Implantable Lead Location: 753860
Implantable Lead Model: 5076
Implantable Lead Model: 5076
Implantable Pulse Generator Implant Date: 20210712
Lead Channel Impedance Value: 285 Ohm
Lead Channel Impedance Value: 342 Ohm
Lead Channel Impedance Value: 418 Ohm
Lead Channel Impedance Value: 475 Ohm
Lead Channel Pacing Threshold Amplitude: 0.625 V
Lead Channel Pacing Threshold Amplitude: 0.875 V
Lead Channel Pacing Threshold Pulse Width: 0.4 ms
Lead Channel Pacing Threshold Pulse Width: 0.4 ms
Lead Channel Sensing Intrinsic Amplitude: 1.375 mV
Lead Channel Sensing Intrinsic Amplitude: 1.375 mV
Lead Channel Sensing Intrinsic Amplitude: 11.625 mV
Lead Channel Sensing Intrinsic Amplitude: 11.625 mV
Lead Channel Setting Pacing Amplitude: 2.5 V
Lead Channel Setting Pacing Pulse Width: 0.4 ms
Lead Channel Setting Sensing Sensitivity: 0.9 mV

## 2021-12-03 ENCOUNTER — Ambulatory Visit: Payer: Medicare PPO | Admitting: Cardiovascular Disease

## 2021-12-03 ENCOUNTER — Encounter: Payer: Self-pay | Admitting: Cardiovascular Disease

## 2021-12-03 DIAGNOSIS — N1832 Chronic kidney disease, stage 3b: Secondary | ICD-10-CM | POA: Diagnosis not present

## 2021-12-03 DIAGNOSIS — I4811 Longstanding persistent atrial fibrillation: Secondary | ICD-10-CM

## 2021-12-03 DIAGNOSIS — I10 Essential (primary) hypertension: Secondary | ICD-10-CM | POA: Diagnosis not present

## 2021-12-03 DIAGNOSIS — Z8673 Personal history of transient ischemic attack (TIA), and cerebral infarction without residual deficits: Secondary | ICD-10-CM

## 2021-12-03 DIAGNOSIS — Z7901 Long term (current) use of anticoagulants: Secondary | ICD-10-CM

## 2021-12-03 DIAGNOSIS — Z950 Presence of cardiac pacemaker: Secondary | ICD-10-CM | POA: Diagnosis not present

## 2021-12-03 DIAGNOSIS — Z951 Presence of aortocoronary bypass graft: Secondary | ICD-10-CM | POA: Diagnosis not present

## 2021-12-03 DIAGNOSIS — G4733 Obstructive sleep apnea (adult) (pediatric): Secondary | ICD-10-CM

## 2021-12-03 DIAGNOSIS — I25118 Atherosclerotic heart disease of native coronary artery with other forms of angina pectoris: Secondary | ICD-10-CM | POA: Diagnosis not present

## 2021-12-03 DIAGNOSIS — R600 Localized edema: Secondary | ICD-10-CM

## 2021-12-03 MED ORDER — FUROSEMIDE 20 MG PO TABS: 20.0000 mg | ORAL_TABLET | Freq: Two times a day (BID) | ORAL | 1 refills | Status: DC

## 2021-12-03 NOTE — Patient Instructions (Signed)
Medication Instructions:  The current medical regimen is effective;  continue present plan and medications.  *If you need a refill on your cardiac medications before your next appointment, please call your pharmacy*   Lab Work: BMET, CBC today   If you have labs (blood work) drawn today and your tests are completely normal, you will receive your results only by: MyChart Message (if you have MyChart) OR A paper copy in the mail If you have any lab test that is abnormal or we need to change your treatment, we will call you to review the results.    Follow-Up: At [facility], you and your health needs are our priority.  As part of our continuing mission to provide you with exceptional heart care, we have created designated Provider Care Teams.  These Care Teams include your primary Cardiologist (physician) and Advanced Practice Providers (APPs -  Physician Assistants and Nurse Practitioners) who all work together to provide you with the care you need, when you need it.  We recommend signing up for the patient portal called "MyChart".  Sign up information is provided on this After Visit Summary.  MyChart is used to connect with patients for Virtual Visits (Telemedicine).  Patients are able to view lab/test results, encounter notes, upcoming appointments, etc.  Non-urgent messages can be sent to your provider as well.   To learn more about what you can do with MyChart, go to [email or web address].    Your next appointment:   6 month(s)  The format for your next appointment:   In Person  Provider:   Nicki Guadalajara, MD

## 2021-12-03 NOTE — Progress Notes (Signed)
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 with a 26 mm Edwards S3U via the TF approach by Drs McAlhaAngelena Formtle   Sleep apnea     Past Surgical History:  Procedure Laterality Date   CARDIAC CATHETERIZATION  02/27/2004   Coronary intervention and medical management   CARDIAC CATHETERIZATION  11/24/2016   CARDIOVERSION N/A 12/20/2019   Procedure: CARDIOVERSION;  Surgeon: RandolSkeet Latch [facility]: Cardiovascular;  Laterality: N/A;   CARDIOVERSION N/A 02/05/2020   Procedure: CARDIOVERSION;  Surgeon: NishanJosue Hector [facility];  Service: Cardiovascular;  Laterality: N/A;   CATARACT EXTRACTION W/ INTRAOCULAR LENS IMPLANT Left    CORONARY ANGIOPLASTY  1994 X 2   "before bypass surgery"   CORONARY ANGIOPLASTY WITH STENT PLACEMENT  03/04/2004   SVG supplying the diagonal vessel stented with a 3.5x16mm T99m stent post dilated to 4.0 mm   CORONARY ARTERY BYPASS GRAFT  1994   "CABG X4"   INGUINAL HERNIA REPAIR Right    PACEMAKER IMPLANT N/A 11/20/2019   Procedure: PACEMAKER IMPLANT;  Surgeon: Klein, Deboraha SprangLocation: [facility];  Service: Cardiovascular;  Laterality: N/A;   RIGHT HEART CATH AND CORONARY/GRAFT ANGIOGRAPHY N/A 11/24/2016   Procedure: Right Heart Cath and Coronary/Graft Angiography;  Surgeon: , Troy SineLocation: [facility];  Service: Cardiovascular;  Laterality: N/A;    RIGHT/LEFT HEART CATH AND CORONARY/GRAFT ANGIOGRAPHY N/A 10/18/2019   Procedure: RIGHT/LEFT HEART CATH AND CORONARY/GRAFT ANGIOGRAPHY;  Surgeon: , Troy SineLocation: [facility];  Service: Cardiovascular;  Laterality: N/A;   TEE WITHOUT CARDIOVERSION N/A 11/14/2019   Procedure: TRANSESOPHAGEAL ECHOCARDIOGRAM (TEE);  Surgeon: McAlhanBurnell BlanksLocation: [facility];  Service: Open Heart Surgery;  Laterality: N/A;   TONSILLECTOMY AND ADENOIDECTOMY     TRANSCATHETER AORTIC VALVE REPLACEMENT, TRANSFEMORAL N/A 11/14/2019   Procedure: TRANSCATHETER AORTIC VALVE REPLACEMENT, TRANSFEMORAL;  Surgeon: McAlhanBurnell BlanksLocation: [facility];  Service: Open Heart Surgery;  Laterality: N/A;    Allergies  Allergen Reactions   Ramipril Swelling and Other (See Comments)    Mouth swelling Other reaction(s): Tongue swelling?   Guaifenesin Er Hives, Swelling and Other (See Comments)    Mouth swelling Other reaction(s): swelling of tongue   Azithromycin     Other reaction(s):  Does not work Other reaction(s): Does not work   Iodinated [employer] Other (See Comments)    Made eyes change each time Other reaction(s): Unknown    Current Outpatient Medications  Medication Sig Dispense Refill   albuterol (VENTOLIN HFA) 108 (90 Base) MCG/ACT inhaler 2 inhalations every 4 - 6 hours as needed 8.5 each 3   amLODipine (NORVASC) 10 MG tablet Take 1 tablet (10 mg total) by mouth daily. 90 tablet 3   apixaban (ELIQUIS) 2.5 MG TABS tablet Take 1 tablet (2.5 mg total) by mouth 2 (two) times daily. 180 tablet 3   atorvastatin (LIPITOR) 20 MG tablet Take 1 tablet (20 mg total) by mouth at bedtime. 30 tablet 0   carvedilol (COREG) 3.125 MG tablet Take 1 tablet (3.125 mg total) by mouth 2 (two) times daily with a meal. 180 tablet 1   docusate sodium (COLACE) 100 MG capsule Take 100 mg by mouth 2 (two) times daily as needed for mild constipation.     DYMISTA 137-50  MCG/ACT SUSP Place 1-2 sprays into both nostrils daily as needed.     ezetimibe (ZETIA) 10 MG tablet TAKE 1 TABLET BY MOUTH EVERY DAY 90 tablet 3   fenofibrate (TRICOR) 145 MG tablet TAKE 1/2 TABLETS BY MOUTH DAILY 45 tablet 8   finasteride (PROSCAR) 5 MG tablet Take 5 mg by mouth daily.     fluticasone (FLONASE) 50 MCG/ACT nasal spray Place 2 sprays into both nostrils daily as needed for allergies or rhinitis.      ipratropium (ATROVENT) 0.03 % nasal spray Place 2 sprays into both nostrils 2 (two) times daily. 30 mL 11   isosorbide mononitrate (IMDUR) 30 MG 24 hr tablet Take 1 tablet (30 mg total) by mouth daily. 90 tablet 3   levothyroxine (SYNTHROID) 25 MCG tablet Take 25 mcg by mouth daily before breakfast.      loratadine (CLARITIN) 10 MG tablet Take 1 tablet (10 mg total) by mouth 2 (two) times daily as needed for allergies (Can use an extra dose during flares). 60 tablet 11   Multiple Vitamins-Minerals (CENTRUM SILVER 50+MEN) TABS Take 1 tablet by mouth at bedtime.     nitroGLYCERIN (NITROLINGUAL) 0.4 MG/SPRAY spray PLACE 1 SPRAY UNDER THE TONGUE EVERY 5 (FIVE) MINUTES X 3 DOSES AS NEEDED FOR CHEST PAIN. 12 g 1   Omega-3 Fatty Acids (FISH OIL) 1000 MG CAPS Take 1,000 mg by mouth 2 (two) times daily.      pantoprazole (PROTONIX) 40 MG tablet Take 1 tablet (40 mg total) by mouth daily. 30 tablet 11   potassium chloride SA (KLOR-CON M) 20 MEQ tablet Take 1 tablet (20 mEq total) by mouth daily. 90 tablet 3   ranolazine (RANEXA) 500 MG 12 hr tablet TAKE 1 TABLET BY MOUTH TWICE A DAY 180 tablet 2   vitamin C (ASCORBIC ACID) 500 MG tablet Take 500 mg by mouth at bedtime.      vitamin E 400 UNIT capsule Take 400 Units by mouth daily.     zinc gluconate 50 MG tablet Take 50 mg by mouth daily.     furosemide (LASIX) 20 MG tablet Take 1 tablet (20 mg total) by mouth 2 (two) times daily. 90 tablet 1   No current facility-administered medications for this visit.    Socially he is married and has 2  children and 4 grandchildren. There is no tobacco alcohol use. Recently he was has been very active.  ROS General: Negative; No fevers, chills, or night  sweats;  HEENT: He is hard of hearing; A new complaint is that of ringing in his ears; right eye diplopia; no sinus congestion, difficulty swallowing Pulmonary: Negative; No cough, wheezing, shortness of breath, hemoptysis Cardiovascular: See HPI GI: Positive for recent GI bleed secondary to diverticular disease GU: Negative; No dysuria, hematuria, or difficulty voiding Musculoskeletal: Negative; no myalgias, joint pain, or weakness Hematologic/Oncology: Negative; no easy bruising, bleeding Endocrine: Negative; no heat/cold intolerance; no diabetes Neuro: Visual changes following recent Skin: Negative; No rashes or skin lesions Psychiatric: Negative; No behavioral problems, depression Sleep: Positive for OSA with history of snoring, daytime sleepiness, no bruxism, restless legs, hypnogognic hallucinations, no cataplexy Other comprehensive 14 point system review is negative.  PE BP 128/68 (BP Location: Right Arm, Patient Position: Sitting, Cuff Size: Normal)   Pulse 72   Ht 5' 8" (1.727 m)   Wt 186 lb (84.4 kg)   BMI 28.28 kg/m    Repeat blood pressure by me was 130/68  Wt Readings from Last 3 Encounters:  12/03/21 186 lb (84.4 kg)  10/01/21 184 lb 6.4 oz (83.6 kg)  08/14/21 186 lb (84.4 kg)   General: Alert, oriented, no distress.  Skin: normal turgor, no rashes, warm and dry HEENT: Normocephalic, atraumatic.  right eye diplopia with double vision improved with prism glasses Nose without nasal septal hypertrophy Mouth/Parynx benign; Mallinpatti scale 3 Neck: No JVD, no carotid bruits; normal carotid upstroke Lungs: clear to ausculatation and percussion; no wheezing or rales Chest wall: without tenderness to palpitation Heart: PMI not displaced, RRR, s1 s2 normal, 1/6 systolic murmur, no diastolic murmur, no rubs, gallops,  thrills, or heaves Abdomen: soft, nontender; no hepatosplenomehaly, BS+; abdominal aorta nontender and not dilated by palpation. Back: no CVA tenderness Pulses 2+ Musculoskeletal: full range of motion, normal strength, no joint deformities Extremities: 1+ left lower extremity and 2+ right lower extremity edema. No clubbing cyanosis Homan's sign negative  Neurologic: grossly nonfocal; Cranial nerves grossly wnl Psychologic: Normal mood and affect    December 03, 2021 ECG (independently read by me): V paced rhythm   July 21, 2021 ECG (independently read by me): Sinus rhythm with frequent V pacing  March 19, 2021 ECG (independently read by me): V paced at 55. Underlying AF   November 05, 2020 ECG (independently read by me):  Atrial fibrillation at 76, LVH with QRS widening, LAD;  March 3,2022 ECG (independently read by me): Atrial fibrillation at 64, QTc 480 msec  ECG (independently read by me): Atrial fibrillation at 62 with a competing junctional pacemaker, Left axis deviation, QTc 460 msec  March 2020 ECG (independently read by me): Sinus rhythm at 71 bpm.  First-degree block with 212 ms.  Right bundle branch block with repolarization changes.  Left anterior hemiblock.  January 26, 2018 ECG (independently read by me): Sinus bradycardia at 47 bpm with first-degree AV block.  PR interval 218 ms.  LVH with QRS widening.  Left axis deviation.  October 28, 2017 ECG (independently read by me): Atrial fibrillation with variable rate that seems to be more in the 50s to 60s but following a pause drops into the 40s  August 26, 2017 ECG (independently read by me): atrial fibrillation at 56 bpm.  Nonspecific ST changes.  QTc interval 476 ms.  February 2019 ECG (independently read by me): Atrial fibrillation at 71 bpm.  LVH by voltage in aVL.  Nonspecific T changes.  04/27/2017 ECG (independently read by me): Normal sinus rhythm at 60 bpm.  Nonspecific intraventricular block.  Anterior T-wave  abnormality  03/23/2017 ECG (independently read by me): atrial fibrillation at 64 bpm.  Nonspecific interventricular block.  Nonspecific T wave abnormality.  02/17/2017 ECG (independently read by me): Atrial fibrillation at 72 bpm.  RV conduction delay.  QTc interval 453 ms.  01/29/2017 ECG (independently read by me): Atrial fibrillation at 56 bpm.  LVH by voltage criteria.  Nonspecific ST changes.  QTc interval 430 ms.  August 2018 ECG (independently read by me): Sinus bradycardia 59 bpm.  LVH by voltage.  QTc interval 469 ms.  No significant ST-T changes.  April 2018 ECG (independently read by me): Sinus rhythm at 61 bpm.  RV conduction delay.  LVH.  March 2018 ECG (independently read by me): Atrial fibrillation at 71 bpm.  RV conduction delay.  QTc interval 456 ms.  Left axis deviation.  February 2018 ECG (independently read by me): Normal sinus rhythm at 60 bpm.  LVH.  QTc interval at 464 ms.  Nonspecific T abnormality  December 2017 ECG (independently read by me):  Sinus bradycardia 57 bpm.  LVH by voltage. No significant ST changes.  January 10, 2016 ECG (independently read by me): Normal sinus rhythm at 64 bpm.  LVH.  QTc interval 468 ms.  December 2016 ECG (independently read by me):  Sinus bradycardia 51 bpm.  No ectopy. QTc interval 429 ms.  LVH by voltage criteria in aVL.  September 2016 ECG (independently read by me):  Sinus bradycardia with first-degree AV block. RV conduction delay. No significant ST-T changes.   August 2016 ECG (independently read by me): Sinus bradycardia 57 bpm.  Mild RV conduction delay.  May 2016 ECG (independently read by me): Sinus bradycardia at 49 bpm with first-degree AV block with a PR interval 218 ms.  Right reticular conduction delay.  December 2015 ECG (independently read by me).  For these: Sinus bradycardia 57 bpm.  First-degree AV block with a PR interval at 224 ms.  No significant ST-T changes.  March 2015 ECG (independently read by me):  Sinus rhythm at 59 beats per minute. Mild LVH by voltage criteria in aVL. Normal intervals.  01/05/2013 ECG: Normal sinus rhythm at 62. Mild RV conduction delay. PR interval 204 ms, QTc interval 452 ms.  LABS:    Latest Ref Rng & Units 12/03/2021    3:33 PM 10/13/2021    1:20 PM 09/29/2021    1:42 PM  BMP  Glucose 70 - 99 mg/dL 84  113  127   BUN 8 - 27 mg/dL 27  27  56   Creatinine 0.76 - 1.27 mg/dL 1.63  1.44  2.37   BUN/Creat Ratio 10 - 24 17     Sodium 134 - 144 mmol/L 144  140  141   Potassium 3.5 - 5.2 mmol/L 4.4  4.3  3.0   Chloride 96 - 106 mmol/L 108  117  104   CO2 20 - 29 mmol/L _0 Calcium 8.6 - 10.2 mg/dL 9.8  8.9  9.0       Latest Ref Rng & Units 06/28/2021    5:23 PM 11/07/2020   12:00 AM 11/10/2019   12:25 PM  Hepatic Function  Total Protein 6.5 - 8.1 g/dL 6.5  6.5  5.8   Albumin 3.5 - 5.0 g/dL 3.5  4.2  3.3   AST 15 - 41 U/L 25  24  41   ALT 0 - 44 U/L 22  18  50   Alk Phosphatase  38 - 126 U/L 43  62  45   Total Bilirubin 0.3 - 1.2 mg/dL 0.6  0.4  0.9       Latest Ref Rng & Units 12/03/2021    3:33 PM 07/11/2021    9:27 AM 07/01/2021    4:16 AM  CBC  WBC 3.4 - 10.8 x10E3/uL 6.6  6.5  5.4   Hemoglobin 13.0 - 17.7 g/dL 12.4  13.7  11.9   Hematocrit 37.5 - 51.0 % 37.3  42.0  37.0   Platelets 150 - 450 x10E3/uL 177  146  132    Lab Results  Component Value Date   MCV 94 12/03/2021   MCV 92 07/11/2021   MCV 92.5 07/01/2021   Lab Results  Component Value Date   TSH 1.671 06/29/2021   Lab Results  Component Value Date   HGBA1C 5.5 06/28/2021     Lipid Panel     Component Value Date/Time   CHOL 108 07/11/2021 0927   CHOL 137 04/09/2014 1115   TRIG 123 07/11/2021 0927   TRIG 182 (H) 04/09/2014 1115   HDL 38 (L) 07/11/2021 0927   HDL 32 (L) 04/09/2014 1115   CHOLHDL 2.8 07/11/2021 0927   CHOLHDL 3.2 06/28/2021 2349   VLDL 14 06/28/2021 2349   LDLCALC 48 07/11/2021 0927   LDLCALC 69 04/09/2014 1115     RADIOLOGY: No results found.  A  CPAP download was obtained in the office with reference to his obstructive sleep apnea from August 18 through January 24, 2018. He is 100% compliant with usage days and averaging 6 hours and 35 minutes of usage daily.  At his 12 cm pressure, AHI is excellent at 0.9.  He does have a mask leak.  CPAP November 10 through April 18, 2018: Compliance 97% usage days and usage greater than 4 hours.  Average use 6 hours 46 minutes.  At 12 cm pressure, AHI 1.2/h.  CPAP download June 25, 2018 through July 24, 2018.  Usage 87%, usage greater than 4 hours 83%, average usage on days used 6 hours and 34 minutes.  AHI 1.2 8012 cm pressure.  A download was obtained in the office from May 2022 through November 04, 2020.  Usage days only 63%.  Average usage is 6 hours 22 minutes.  AHI 5.0 with a auto minimum pressure 11 maximum pressure 16 with 95 percentile pressure 11.5.  There is mask leak.  A download from February 12 through July 21, 2021 showed 50% use although the patient was hospitalized during some of this time..  Average use 5 hours and 50 minutes.  There is significant mask leak contributing to increased AHI at 9.7.  IMPRESSION:  1. Longstanding persistent atrial fibrillation ([place])   2. Anticoagulated   3. Bilateral lower extremity edema   4. Stage 3b chronic kidney disease ([place])   5. Coronary artery disease of native artery of native heart with stable angina pectoris ([place])   6. Hx of CABG   7. OSA (obstructive sleep apnea)   8. Pacemaker   9. Essential hypertension   10. History of CVA (cerebrovascular accident)     ASSESSMENT AND PLAN: Mr. Marte is an 86 year-old  male who is status post CABG revascularization surgery in 1994.  Subsequently he underwent DES stenting to the graft supplying the diagonal vessel in 2003.  In July 2017 he was hospitalized with atrial fibrillation in the setting of bradycardia and hypotension leading to medication adjustment  An echo Doppler study  of 11/21/2015  showed an EF of 55-60%, mild aortic stenosis with a valve area of 1.75 mm per there was mild pulmonary hypertension. Subsequently he had complained of experiencing some episodes of chest tightness and his symptoms improved with further titration of nitrates as well as beta blocker therapy.  He developed significant anemia, which undoubtedly exacerbated his anginal symptomatology.  At catheterization in July 2018 although his LIMA to mid LAD and vein graft to diagonal vessel are patent the very proximal LAD had a 95% stenosis which supplies a diagonal vessel, which was not supplied by the grafts.  His native RCA and graft to RCA was occluded and there is some collateralization to the distal RCA via the vein graft supplying the circumflex marginal vessel.  His anginal symptoms improved with stabilization of his hemoglobin.  Spironolactone was discontinued secondary to renal insufficiency.  Due to progressive aortic stenosis he underwent TAVR with 26 mm SAPIEN 3 valve on November 14, 2019 and due to subsequent prolonged pauses with syncope underwent dual chamber Medtronic permanent pacemaker by Dr. Caryl Comes on November 20, 2019.  He has been on Eliquis therapy for persistent long-term atrial fibrillation.  He has had issues with lower extremity edema, and was hospitalized with suspected CVA in the setting of missed doses of Eliquis in February 2023.  His hospital records were reviewed.  He subsequently developed diplopia of his right eye and is now wearing prism glasses.  He has developed increasing creatinine greater than 1.5 and his Eliquis dose has been reduced to 2.5 mg twice a day.  Presently, he is without chest pain and denies recurrent dyspnea.  He does have lower extremity edema bilaterally right greater than left.  I suggested he is wearing support stockings.  He was recently started on furosemide now taking 20 mg twice a day.  He has underlying atrial fibrillation with ventricular pacing on ECG.  He continues to be on  carvedilol 3.125 mg twice a day, isosorbide 30 mg daily, Ranexa 500 mg twice a day and is not having any anginal symptomatology.  He is on Zetia 10 mg, fenofibrate 145 mg for mixed hyperlipidemia.  His GERD is controlled with pantoprazole.  He has OSA with intermittent CPAP use.  I will recheck a be met in CBC today to reassess his serum creatinine with his reduced dose of Eliquis.  I will see him in 6 months for reevaluation or sooner as needed.     Troy Sine, MD, [facility]  12/06/2021 4:51 PM

## 2021-12-04 LAB — BASIC METABOLIC PANEL
BUN/Creatinine Ratio: 17 (ref 10–24)
BUN: 27 mg/dL (ref 8–27)
CO2: 20 mmol/L (ref 20–29)
Calcium: 9.8 mg/dL (ref 8.6–10.2)
Chloride: 108 mmol/L — ABNORMAL HIGH (ref 96–106)
Creatinine, Ser: 1.63 mg/dL — ABNORMAL HIGH (ref 0.76–1.27)
Glucose: 84 mg/dL (ref 70–99)
Potassium: 4.4 mmol/L (ref 3.5–5.2)
Sodium: 144 mmol/L (ref 134–144)
eGFR: 42 mL/min/{1.73_m2} — ABNORMAL LOW (ref >59)

## 2021-12-04 LAB — CBC
Hematocrit: 37.3 % — ABNORMAL LOW (ref 37.5–51.0)
Hemoglobin: 12.4 g/dL — ABNORMAL LOW (ref 13.0–17.7)
MCH: 31.1 pg (ref 26.6–33.0)
MCHC: 33.2 g/dL (ref 31.5–35.7)
MCV: 94 fL (ref 79–97)
Platelets: 177 10*3/uL (ref 150–450)
RBC: 3.99 x10E6/uL — ABNORMAL LOW (ref 4.14–5.80)
RDW: 12.8 % (ref 11.6–15.4)
WBC: 6.6 10*3/uL (ref 3.4–10.8)

## 2021-12-05 NOTE — Progress Notes (Signed)
Remote pacemaker transmission.   

## 2021-12-06 ENCOUNTER — Encounter: Payer: Self-pay | Admitting: Cardiovascular Disease

## 2021-12-31 DIAGNOSIS — H6983 Other specified disorders of Eustachian tube, bilateral: Secondary | ICD-10-CM | POA: Diagnosis not present

## 2022-01-02 DIAGNOSIS — I25810 Atherosclerosis of coronary artery bypass graft(s) without angina pectoris: Secondary | ICD-10-CM | POA: Diagnosis not present

## 2022-01-02 DIAGNOSIS — D692 Other nonthrombocytopenic purpura: Secondary | ICD-10-CM | POA: Diagnosis not present

## 2022-01-02 DIAGNOSIS — R202 Paresthesia of skin: Secondary | ICD-10-CM | POA: Diagnosis not present

## 2022-01-02 DIAGNOSIS — I5032 Chronic diastolic (congestive) heart failure: Secondary | ICD-10-CM | POA: Diagnosis not present

## 2022-01-02 DIAGNOSIS — I130 Hypertensive heart and chronic kidney disease with heart failure and stage 1 through stage 4 chronic kidney disease, or unspecified chronic kidney disease: Secondary | ICD-10-CM | POA: Diagnosis not present

## 2022-01-02 DIAGNOSIS — I350 Nonrheumatic aortic (valve) stenosis: Secondary | ICD-10-CM | POA: Diagnosis not present

## 2022-01-02 DIAGNOSIS — I480 Paroxysmal atrial fibrillation: Secondary | ICD-10-CM | POA: Diagnosis not present

## 2022-01-02 DIAGNOSIS — E032 Hypothyroidism due to medicaments and other exogenous substances: Secondary | ICD-10-CM | POA: Diagnosis not present

## 2022-01-02 DIAGNOSIS — N1831 Chronic kidney disease, stage 3a: Secondary | ICD-10-CM | POA: Diagnosis not present

## 2022-01-14 ENCOUNTER — Telehealth: Payer: Self-pay | Admitting: Cardiovascular Disease

## 2022-01-14 NOTE — Telephone Encounter (Signed)
Pt wife calling stating that pt CPAP machine is not working properly.   She also said she is looking for a medical supply store in [place].

## 2022-01-14 NOTE — Telephone Encounter (Signed)
Called pt's wife. SSPoke with her and the pt. I gave them the number to a medical supply store in [place].  When asked how the CPAP was not working he states "I'm supposed to have 12 pounds but I'm only getting 2 pounds. I tried to adjust it but it did not work. I've seen a nurse do it before, if I knew the procedure I could do it."  Will get message to Dr. Landry Dyke nurse to see if she has insight on CPAP machines and getting them adjusted.

## 2022-01-24 ENCOUNTER — Other Ambulatory Visit: Payer: Self-pay | Admitting: Cardiovascular Disease

## 2022-01-24 DIAGNOSIS — G4733 Obstructive sleep apnea (adult) (pediatric): Secondary | ICD-10-CM

## 2022-01-24 DIAGNOSIS — I4811 Longstanding persistent atrial fibrillation: Secondary | ICD-10-CM

## 2022-01-24 DIAGNOSIS — R600 Localized edema: Secondary | ICD-10-CM

## 2022-01-28 DIAGNOSIS — H6983 Other specified disorders of Eustachian tube, bilateral: Secondary | ICD-10-CM | POA: Diagnosis not present

## 2022-02-19 ENCOUNTER — Ambulatory Visit (INDEPENDENT_AMBULATORY_CARE_PROVIDER_SITE_OTHER): Payer: Medicare PPO

## 2022-02-19 DIAGNOSIS — I442 Atrioventricular block, complete: Secondary | ICD-10-CM | POA: Diagnosis not present

## 2022-02-19 DIAGNOSIS — M5412 Radiculopathy, cervical region: Secondary | ICD-10-CM | POA: Diagnosis not present

## 2022-02-19 DIAGNOSIS — Z6828 Body mass index (BMI) 28.0-28.9, adult: Secondary | ICD-10-CM | POA: Diagnosis not present

## 2022-02-19 LAB — CUP PACEART REMOTE DEVICE CHECK
Battery Remaining Longevity: 139 mo
Battery Voltage: 3.01 V
Brady Statistic RA Percent Paced: 0 %
Brady Statistic RV Percent Paced: 26.4 %
Date Time Interrogation Session: 20231012060526
Implantable Lead Implant Date: 20210712
Implantable Lead Implant Date: 20210712
Implantable Lead Location: 753859
Implantable Lead Location: 753860
Implantable Lead Model: 5076
Implantable Lead Model: 5076
Implantable Pulse Generator Implant Date: 20210712
Lead Channel Impedance Value: 285 Ohm
Lead Channel Impedance Value: 342 Ohm
Lead Channel Impedance Value: 399 Ohm
Lead Channel Impedance Value: 399 Ohm
Lead Channel Pacing Threshold Amplitude: 0.625 V
Lead Channel Pacing Threshold Amplitude: 0.75 V
Lead Channel Pacing Threshold Pulse Width: 0.4 ms
Lead Channel Pacing Threshold Pulse Width: 0.4 ms
Lead Channel Sensing Intrinsic Amplitude: 0.75 mV
Lead Channel Sensing Intrinsic Amplitude: 0.75 mV
Lead Channel Sensing Intrinsic Amplitude: 10.125 mV
Lead Channel Sensing Intrinsic Amplitude: 10.125 mV
Lead Channel Setting Pacing Amplitude: 2.5 V
Lead Channel Setting Pacing Pulse Width: 0.4 ms
Lead Channel Setting Sensing Sensitivity: 0.9 mV

## 2022-02-24 ENCOUNTER — Telehealth: Payer: Self-pay | Admitting: *Deleted

## 2022-02-24 NOTE — Telephone Encounter (Signed)
Returned a call to the patient. She states per patient his machine is blowing too hard. Report was pulled from [facility] portal by me that shows the patient has a large mask leak. Questioned the last time his mask cushion has been changed. Per wife he hasn't changed it out in a while. They are currently temporarily living in [place] taking care of a ill family member. Wife says she attempted to buy a mask where they are and was told by DME company they could not sell them any supplies since the machine was not purchased from them. She informed me when asked that he currently is using a [employer] F-20 mask, size medium. She was advised that they can go online to purchase a new cushion or if they will be coming to [place] in the very near future I can give him a sample mask. Wife states they will be coming to [place] on tomorrow and will come to [facility] and pick up the sample mask. [employer] F-20 medium mask left up front for them to pick up.

## 2022-03-02 NOTE — Progress Notes (Signed)
Remote pacemaker transmission.   

## 2022-03-04 DIAGNOSIS — R7989 Other specified abnormal findings of blood chemistry: Secondary | ICD-10-CM | POA: Diagnosis not present

## 2022-03-04 DIAGNOSIS — E032 Hypothyroidism due to medicaments and other exogenous substances: Secondary | ICD-10-CM | POA: Diagnosis not present

## 2022-03-04 DIAGNOSIS — E785 Hyperlipidemia, unspecified: Secondary | ICD-10-CM | POA: Diagnosis not present

## 2022-03-04 DIAGNOSIS — Z125 Encounter for screening for malignant neoplasm of prostate: Secondary | ICD-10-CM | POA: Diagnosis not present

## 2022-03-08 ENCOUNTER — Other Ambulatory Visit: Payer: Self-pay | Admitting: Internal Medicine

## 2022-03-08 ENCOUNTER — Other Ambulatory Visit: Payer: Self-pay | Admitting: Cardiovascular Disease

## 2022-03-08 ENCOUNTER — Other Ambulatory Visit: Payer: Self-pay | Admitting: Allergy and Immunology

## 2022-03-12 DIAGNOSIS — I480 Paroxysmal atrial fibrillation: Secondary | ICD-10-CM | POA: Diagnosis not present

## 2022-03-12 DIAGNOSIS — R202 Paresthesia of skin: Secondary | ICD-10-CM | POA: Diagnosis not present

## 2022-03-12 DIAGNOSIS — Z0000 Encounter for general adult medical examination without abnormal findings: Secondary | ICD-10-CM | POA: Diagnosis not present

## 2022-03-12 DIAGNOSIS — R82998 Other abnormal findings in urine: Secondary | ICD-10-CM | POA: Diagnosis not present

## 2022-03-12 DIAGNOSIS — E785 Hyperlipidemia, unspecified: Secondary | ICD-10-CM | POA: Diagnosis not present

## 2022-03-12 DIAGNOSIS — Z23 Encounter for immunization: Secondary | ICD-10-CM | POA: Diagnosis not present

## 2022-03-12 DIAGNOSIS — E032 Hypothyroidism due to medicaments and other exogenous substances: Secondary | ICD-10-CM | POA: Diagnosis not present

## 2022-03-12 DIAGNOSIS — I130 Hypertensive heart and chronic kidney disease with heart failure and stage 1 through stage 4 chronic kidney disease, or unspecified chronic kidney disease: Secondary | ICD-10-CM | POA: Diagnosis not present

## 2022-03-12 DIAGNOSIS — N1831 Chronic kidney disease, stage 3a: Secondary | ICD-10-CM | POA: Diagnosis not present

## 2022-03-12 DIAGNOSIS — I350 Nonrheumatic aortic (valve) stenosis: Secondary | ICD-10-CM | POA: Diagnosis not present

## 2022-03-12 DIAGNOSIS — I25810 Atherosclerosis of coronary artery bypass graft(s) without angina pectoris: Secondary | ICD-10-CM | POA: Diagnosis not present

## 2022-03-16 ENCOUNTER — Ambulatory Visit: Payer: Medicare PPO | Attending: Student

## 2022-03-16 DIAGNOSIS — M79642 Pain in left hand: Secondary | ICD-10-CM | POA: Insufficient documentation

## 2022-03-16 DIAGNOSIS — M79641 Pain in right hand: Secondary | ICD-10-CM | POA: Diagnosis not present

## 2022-03-16 DIAGNOSIS — M6281 Muscle weakness (generalized): Secondary | ICD-10-CM | POA: Insufficient documentation

## 2022-03-16 DIAGNOSIS — M5382 Other specified dorsopathies, cervical region: Secondary | ICD-10-CM | POA: Diagnosis not present

## 2022-03-16 NOTE — Therapy (Signed)
OUTPATIENT PHYSICAL THERAPY CERVICAL EVALUATION   Patient Name: Jorge Cherry MRN: [id number] DOB:07/28/1935, 86 y.o., male Today's Date: 03/16/2022   PT End of Session - 03/16/22 1411     Visit Number 1    Number of Visits 24    Date for PT Re-Evaluation 06/08/22    Progress Note Due on Visit 10    PT Start Time 1315    PT Stop Time 1350    PT Time Calculation (min) 35 min    Activity Tolerance Patient tolerated treatment well;No increased pain    Behavior During Therapy WFL for tasks assessed/performed             Past Medical History:  Diagnosis Date   Arthritis    "just a touch in my hands" (11/24/2016)   Asthma    Atherosclerosis of renal artery (HCC)    RENAL DOPPLER, 12/10/2011 - Left renal artery demonstrated narrowing with elevated velocities consistent with a 1-59% diameter reduction   CKD (chronic kidney disease) stage 3, GFR 30-59 ml/min (HCC)    "stable now since they backed off the water pills" (11/24/2016)   Coronary artery disease    a. 1994 s/p CABG x 4 (LIMA-LAD, VG->D2, VG->OM, VG->RCA); b. 02/2004 PCI SVG-D2 (3.5x16 Taxus DES). VG->RCA 100. Sev apical LAD dzs distal to LIMA insertion; c. 11/2016 Cath: LAD 95p/19m, D2 100ost, LCX 100ost, 70p/m, RCA 100p/m, RPDA fills via L->R collats. VG->RCA 100, VG->D2 20 ost, patent prox stent, 59m, LIMA->LAD ok, VG->OM3 20p. EF 55%-->Med Rx.   GERD (gastroesophageal reflux disease)    High cholesterol    History of lower GI bleeding    a. 09/2014 GIB due to diverticulosis/diverticulitis.   Iron deficiency anemia    Labile Hypertension    Moderate aortic stenosis    a. 10/2011 Echo:  EF >55%, mild-mod TR, mild-mod AS, mod Ca2+ of AoV leaflets; b. 07/2016 Echo: EF 60-65%, no rwma, Gr1 DD, mod AS [(S) mean grad , peak grad . Valve area (VTI): 1.33cm^2, (Vmax) 1.44cm^2. Mild MR]; c. 02/2018 Echo: EF 55-60%, no rwma, GR1 DD, Mod AS [Peak Vel (S): 319cm/s, Mean grad (S) , peak grad (S) 31mmHg].   PAF (paroxysmal  atrial fibrillation) (HCC)    a. CHA2DS2VASc = 4-->Eliquis.   S/P TAVR (transcatheter aortic valve replacement) 11/14/2019   s/p TAVR with a 26 mm Edwards S3U via the TF approach by Drs Clifton James & Bartle   Sleep apnea    Past Surgical History:  Procedure Laterality Date   CARDIAC CATHETERIZATION  02/27/2004   Coronary intervention and medical management   CARDIAC CATHETERIZATION  11/24/2016   CARDIOVERSION N/A 12/20/2019   Procedure: CARDIOVERSION;  Surgeon: Chilton Si, MD;  Location: [facility] ENDOSCOPY;  Service: Cardiovascular;  Laterality: N/A;   CARDIOVERSION N/A 02/05/2020   Procedure: CARDIOVERSION;  Surgeon: Wendall Stade, MD;  Location: [facility] ENDOSCOPY;  Service: Cardiovascular;  Laterality: N/A;   CATARACT EXTRACTION W/ INTRAOCULAR LENS IMPLANT Left    CORONARY ANGIOPLASTY  1994 X 2   "before bypass surgery"   CORONARY ANGIOPLASTY WITH STENT PLACEMENT  03/04/2004   SVG supplying the diagonal vessel stented with a 3.5x46mm Taxus stent post dilated to 4.0 mm   CORONARY ARTERY BYPASS GRAFT  1994   "CABG X4"   INGUINAL HERNIA REPAIR Right    PACEMAKER IMPLANT N/A 11/20/2019   Procedure: PACEMAKER IMPLANT;  Surgeon: Duke Salvia, MD;  Location: [facility] INVASIVE CV LAB;  Service: Cardiovascular;  Laterality: N/A;   RIGHT HEART CATH  AND CORONARY/GRAFT ANGIOGRAPHY N/A 11/24/2016   Procedure: Right Heart Cath and Coronary/Graft Angiography;  Surgeon: Troy Sine, MD;  Location: [place] CV LAB;  Service: Cardiovascular;  Laterality: N/A;   RIGHT/LEFT HEART CATH AND CORONARY/GRAFT ANGIOGRAPHY N/A 10/18/2019   Procedure: RIGHT/LEFT HEART CATH AND CORONARY/GRAFT ANGIOGRAPHY;  Surgeon: Troy Sine, MD;  Location: [place] CV LAB;  Service: Cardiovascular;  Laterality: N/A;   TEE WITHOUT CARDIOVERSION N/A 11/14/2019   Procedure: TRANSESOPHAGEAL ECHOCARDIOGRAM (TEE);  Surgeon: Burnell Blanks, MD;  Location: [place] CV LAB;  Service: Open Heart Surgery;  Laterality: N/A;    TONSILLECTOMY AND ADENOIDECTOMY     TRANSCATHETER AORTIC VALVE REPLACEMENT, TRANSFEMORAL N/A 11/14/2019   Procedure: TRANSCATHETER AORTIC VALVE REPLACEMENT, TRANSFEMORAL;  Surgeon: Burnell Blanks, MD;  Location: [place] CV LAB;  Service: Open Heart Surgery;  Laterality: N/A;   Patient Active Problem List   Diagnosis Date Noted   Permanent atrial fibrillation ([place]) 08/14/2021   Acute CVA (cerebrovascular accident) ([place]) 06/30/2021   Gaze palsy    Double vision 06/28/2021   Slurred speech 06/28/2021   Gait disturbance 06/28/2021   Dizziness 06/28/2021   Pacemaker 03/04/2020   Typical atrial flutter ([place])    Persistent atrial fibrillation ([place]) 11/29/2019   Secondary hypercoagulable state ([place]) 11/29/2019   Syncope and collapse 11/20/2019   Heart block AV complete ([place]) 11/20/2019   Renal lesion 11/15/2019   S/P TAVR (transcatheter aortic valve replacement) 11/14/2019   Drug-induced hypothyroidism 01/10/2019   Bradycardia 03/03/2018   Chronic anticoagulation 12/14/2017   Anemia 11/25/2016   Chronic diastolic CHF (congestive heart failure) ([place]) 05/26/2016   Junctional (nodal) bradycardia 11/20/2015   Paroxysmal atrial fibrillation ([place]) 11/20/2015   Hyperlipidemia LDL goal <70 05/03/2015   Mild persistent asthma 03/19/2015   Gastroesophageal reflux disease 03/19/2015   Essential hypertension 02/07/2015   Chest pain 01/22/2015   Pain in the chest    Chronic renal insufficiency, stage III (moderate) ([place]) 12/28/2014   Lower GI bleed 09/24/2014   Diverticulosis of colon with hemorrhage 09/24/2014   Symptomatic anemia 09/24/2014   Severe aortic stenosis 07/22/2013   Hyperlipidemia with target LDL less than 70 10/27/2012   Hearing decreased 10/27/2012   Hx of CABG 10/27/2012   Coronary atherosclerosis of native coronary artery 10/27/2012    PCP:  Dr. Prince Solian  REFERRING PROVIDER: Eleonore Chiquito, NP  REFERRING DIAG: [phone] (ICD-10-CM) - Radiculopathy,  cervical region   THERAPY DIAG:  Muscle weakness (generalized)  Bilateral hand pain  Limited active range of motion (AROM) of cervical spine on rotation  Rationale for Evaluation and Treatment: Rehabilitation  ONSET DATE: 02/22/2022  SUBJECTIVE:  SUBJECTIVE STATEMENT: Patient reports B hand pain/swelling. He reports he has been driving his wife and SIL from [place] to [place] for several months now and reports at some point his hands started aching and went numb.  Reports was given Prednisone and has been more sore since yet the numbness has improved. Reports driving makes his pain worse and some numbness. He denies any neck pain but does endorse limited cervical ROM and some tight neck muscles.    PERTINENT HISTORY:  Patient is a 86 year old male with recent referral for cervical Radiculopathy. No recent medical visit note with neurosurgery available at this time. Patient has known CAD with stent placement in 2003 and has A-Fib.   PAIN:  Are you having pain? Yes: NPRS scale: 5/10 Pain location: B hand Pain description: ache Aggravating factors: Driving long distance, gripping Relieving factors: epsom salt, resting hands on steering wheel  PRECAUTIONS: None  WEIGHT BEARING RESTRICTIONS: No  FALLS:  Has patient fallen in last 6 months? 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    CERVICAL ROM:   Active ROM A/PROM (deg) eval  Flexion 42  Extension 55  Right lateral flexion 23*  Left lateral flexion 28*  Right rotation 48*  Left rotation 52*   (Blank rows = not tested)   UPPER EXTREMITY MMT:  MMT Right eval Left eval  Shoulder flexion 4 4  Shoulder extension 4 4  Shoulder abduction 3+ 3+  Shoulder adduction 4 4  Shoulder internal rotation 4 4  Shoulder external rotation 3+ 3+  Middle trapezius    Lower trapezius    Elbow flexion 4 4  Elbow extension 4 4  Wrist flexion 4 4  Wrist extension 4 4  Wrist ulnar deviation    Wrist radial deviation    Wrist pronation    Wrist supination    Grip strength 19 PSI avg 16 psi avg   (Blank rows = not tested)  CERVICAL SPECIAL TESTS:  Upper limb tension test (ULTT): Negative and Spurling's test: Negative   Repeated Movements No centralization or peripheralization of symptoms with repeated cervical protraction and retraction.       TODAY'S TREATMENT:  DATE: PT Eval only as paitent was 15 min late for evaluation today.  PATIENT EDUCATION:  Education details: PT plan of care; Educated in signs and symptoms of radiculopathy; anatomy of C-spine  Person educated: Patient Education method: Medical illustrator Education comprehension: verbalized understanding, returned demonstration, and needs further education  HOME EXERCISE PROGRAM: To be initiated next 1-2 visits  ASSESSMENT:  CLINICAL IMPRESSION: Patient is a 86 y.o. male who was seen today for physical therapy evaluation and treatment  for Cervical Radiculopathy. Patient presents with limited cervical ROM and poor posture, Some UE muscle weakness, with limited use of B Hands. He will benefit from trial of PT services to see if able to centralize symptoms; improve overall posture ROM/Strength for improved quality of life.   OBJECTIVE IMPAIRMENTS: decreased activity tolerance, decreased mobility, decreased ROM, decreased strength, hypomobility, impaired sensation, postural dysfunction, and pain.   ACTIVITY LIMITATIONS: carrying, lifting, and reach over head  PARTICIPATION LIMITATIONS: driving, community activity, and yard work  PERSONAL FACTORS: 1-2 comorbidities: HTN, arthritis  are also affecting patient's functional outcome.   REHAB POTENTIAL: Good  CLINICAL DECISION MAKING: Stable/uncomplicated  EVALUATION COMPLEXITY: Low   GOALS: Goals reviewed with patient? Yes  SHORT TERM GOALS: Target date: 04/27/2022   Patient will be independent with HEP in order to improve strength and decrease back pain in order to improve pain-free function at home and work.   Baseline: Eval- Patient has no formal knowledge of exercises to improve his condiiton Goal status: INITIAL    LONG TERM GOALS: Target date: 06/08/2022  Pt will improve FOTO to target score of 83 to display perceived improvements in ability to complete ADL's.  Baseline: Eval= 81 Goal status: INITIAL  2.  Patient will present with 50% improved cervical Rotation and lateral cervical flexion for improved neck ROM with driving.  Baseline: Eval= c-spine rotation L/R=52/48 deg and SB= 28/23 deg Goal status: INITIAL  3.  Patient will deny and numbness or swelling into B Hands with driving for improved ability to drive community and Leisurely distances.  Baseline: Eval= Patient driving from [place] to [place] with increased pain/numbness/swelling into B hands  Goal status: INITIAL  4.  Pt will decrease worst B hand pain as reported on NPRS by at least 2  points in order to demonstrate clinically significant reduction in back pain.  Baseline: 5/10 B hand pain Goal status: INITIAL     PLAN:  PT FREQUENCY: 1-2x/week  PT DURATION: 12 weeks  PLANNED INTERVENTIONS: Therapeutic exercises, Therapeutic activity, Neuromuscular re-education, Patient/Family education, Self Care, Joint mobilization, Joint manipulation, Dry Needling, Electrical stimulation, Spinal manipulation, Spinal mobilization, Cryotherapy, Moist heat, Traction, and Manual therapy  PLAN FOR NEXT SESSION: Continue with postural training; manual therapy for ROM - c-spine. Initiate HEP next visit.   Lenda Kelp, PT 03/16/2022, 2:51 PM

## 2022-03-18 ENCOUNTER — Ambulatory Visit: Payer: Medicare PPO

## 2022-03-18 DIAGNOSIS — M79641 Pain in right hand: Secondary | ICD-10-CM | POA: Diagnosis not present

## 2022-03-18 DIAGNOSIS — M6281 Muscle weakness (generalized): Secondary | ICD-10-CM | POA: Diagnosis not present

## 2022-03-18 DIAGNOSIS — M79642 Pain in left hand: Secondary | ICD-10-CM

## 2022-03-18 DIAGNOSIS — M5382 Other specified dorsopathies, cervical region: Secondary | ICD-10-CM | POA: Diagnosis not present

## 2022-03-18 NOTE — Therapy (Signed)
OUTPATIENT PHYSICAL THERAPY CERVICAL TREATMENT   Patient Name: Jorge Cherry MRN: [id number] DOB:10-Dec-1935, 86 y.o., male Today's Date: 03/19/2022   PT End of Session - 03/18/22 1258     Visit Number 2    Number of Visits 24    Date for PT Re-Evaluation 06/08/22    Progress Note Due on Visit 10    PT Start Time 1258    PT Stop Time 1344    PT Time Calculation (min) 46 min    Activity Tolerance Patient tolerated treatment well;No increased pain    Behavior During Therapy WFL for tasks assessed/performed              Past Medical History:  Diagnosis Date   Arthritis    "just a touch in my hands" (11/24/2016)   Asthma    Atherosclerosis of renal artery (HCC)    RENAL DOPPLER, 12/10/2011 - Left renal artery demonstrated narrowing with elevated velocities consistent with a 1-59% diameter reduction   CKD (chronic kidney disease) stage 3, GFR 30-59 ml/min (HCC)    "stable now since they backed off the water pills" (11/24/2016)   Coronary artery disease    a. 1994 s/p CABG x 4 (LIMA-LAD, VG->D2, VG->OM, VG->RCA); b. 02/2004 PCI SVG-D2 (3.5x16 Taxus DES). VG->RCA 100. Sev apical LAD dzs distal to LIMA insertion; c. 11/2016 Cath: LAD 95p/165m, D2 100ost, LCX 100ost, 70p/m, RCA 100p/m, RPDA fills via L->R collats. VG->RCA 100, VG->D2 20 ost, patent prox stent, 25m, LIMA->LAD ok, VG->OM3 20p. EF 55%-->Med Rx.   GERD (gastroesophageal reflux disease)    High cholesterol    History of lower GI bleeding    a. 09/2014 GIB due to diverticulosis/diverticulitis.   Iron deficiency anemia    Labile Hypertension    Moderate aortic stenosis    a. 10/2011 Echo:  EF >55%, mild-mod TR, mild-mod AS, mod Ca2+ of AoV leaflets; b. 07/2016 Echo: EF 60-65%, no rwma, Gr1 DD, mod AS [(S) mean grad , peak grad . Valve area (VTI): 1.33cm^2, (Vmax) 1.44cm^2. Mild MR]; c. 02/2018 Echo: EF 55-60%, no rwma, GR1 DD, Mod AS [Peak Vel (S): 319cm/s, Mean grad (S) , peak grad (S) 72mmHg].   PAF (paroxysmal  atrial fibrillation) (HCC)    a. CHA2DS2VASc = 4-->Eliquis.   S/P TAVR (transcatheter aortic valve replacement) 11/14/2019   s/p TAVR with a 26 mm Edwards S3U via the TF approach by Drs Clifton James & Bartle   Sleep apnea    Past Surgical History:  Procedure Laterality Date   CARDIAC CATHETERIZATION  02/27/2004   Coronary intervention and medical management   CARDIAC CATHETERIZATION  11/24/2016   CARDIOVERSION N/A 12/20/2019   Procedure: CARDIOVERSION;  Surgeon: Chilton Si, MD;  Location: [facility] ENDOSCOPY;  Service: Cardiovascular;  Laterality: N/A;   CARDIOVERSION N/A 02/05/2020   Procedure: CARDIOVERSION;  Surgeon: Wendall Stade, MD;  Location: [facility] ENDOSCOPY;  Service: Cardiovascular;  Laterality: N/A;   CATARACT EXTRACTION W/ INTRAOCULAR LENS IMPLANT Left    CORONARY ANGIOPLASTY  1994 X 2   "before bypass surgery"   CORONARY ANGIOPLASTY WITH STENT PLACEMENT  03/04/2004   SVG supplying the diagonal vessel stented with a 3.5x58mm Taxus stent post dilated to 4.0 mm   CORONARY ARTERY BYPASS GRAFT  1994   "CABG X4"   INGUINAL HERNIA REPAIR Right    PACEMAKER IMPLANT N/A 11/20/2019   Procedure: PACEMAKER IMPLANT;  Surgeon: Duke Salvia, MD;  Location: [facility] INVASIVE CV LAB;  Service: Cardiovascular;  Laterality: N/A;   RIGHT HEART  CATH AND CORONARY/GRAFT ANGIOGRAPHY N/A 11/24/2016   Procedure: Right Heart Cath and Coronary/Graft Angiography;  Surgeon: Lennette Bihari, MD;  Location: [facility] INVASIVE CV LAB;  Service: Cardiovascular;  Laterality: N/A;   RIGHT/LEFT HEART CATH AND CORONARY/GRAFT ANGIOGRAPHY N/A 10/18/2019   Procedure: RIGHT/LEFT HEART CATH AND CORONARY/GRAFT ANGIOGRAPHY;  Surgeon: Lennette Bihari, MD;  Location: [facility] INVASIVE CV LAB;  Service: Cardiovascular;  Laterality: N/A;   TEE WITHOUT CARDIOVERSION N/A 11/14/2019   Procedure: TRANSESOPHAGEAL ECHOCARDIOGRAM (TEE);  Surgeon: Kathleene Hazel, MD;  Location: [facility] INVASIVE CV LAB;  Service: Open Heart Surgery;  Laterality: N/A;    TONSILLECTOMY AND ADENOIDECTOMY     TRANSCATHETER AORTIC VALVE REPLACEMENT, TRANSFEMORAL N/A 11/14/2019   Procedure: TRANSCATHETER AORTIC VALVE REPLACEMENT, TRANSFEMORAL;  Surgeon: Kathleene Hazel, MD;  Location: [facility] INVASIVE CV LAB;  Service: Open Heart Surgery;  Laterality: N/A;   Patient Active Problem List   Diagnosis Date Noted   Permanent atrial fibrillation (HCC) 08/14/2021   Acute CVA (cerebrovascular accident) (HCC) 06/30/2021   Gaze palsy    Double vision 06/28/2021   Slurred speech 06/28/2021   Gait disturbance 06/28/2021   Dizziness 06/28/2021   Pacemaker 03/04/2020   Typical atrial flutter (HCC)    Persistent atrial fibrillation (HCC) 11/29/2019   Secondary hypercoagulable state (HCC) 11/29/2019   Syncope and collapse 11/20/2019   Heart block AV complete (HCC) 11/20/2019   Renal lesion 11/15/2019   S/P TAVR (transcatheter aortic valve replacement) 11/14/2019   Drug-induced hypothyroidism 01/10/2019   Bradycardia 03/03/2018   Chronic anticoagulation 12/14/2017   Anemia 11/25/2016   Chronic diastolic CHF (congestive heart failure) (HCC) 05/26/2016   Junctional (nodal) bradycardia 11/20/2015   Paroxysmal atrial fibrillation (HCC) 11/20/2015   Hyperlipidemia LDL goal <70 05/03/2015   Mild persistent asthma 03/19/2015   Gastroesophageal reflux disease 03/19/2015   Essential hypertension 02/07/2015   Chest pain 01/22/2015   Pain in the chest    Chronic renal insufficiency, stage III (moderate) (HCC) 12/28/2014   Lower GI bleed 09/24/2014   Diverticulosis of colon with hemorrhage 09/24/2014   Symptomatic anemia 09/24/2014   Severe aortic stenosis 07/22/2013   Hyperlipidemia with target LDL less than 70 10/27/2012   Hearing decreased 10/27/2012   Hx of CABG 10/27/2012   Coronary atherosclerosis of native coronary artery 10/27/2012    PCP:  Dr. Chilton Greathouse  REFERRING PROVIDER: Sherryl Manges, NP  REFERRING DIAG: [phone] (ICD-10-CM) - Radiculopathy,  cervical region   THERAPY DIAG:  Muscle weakness (generalized)  Bilateral hand pain  Limited active range of motion (AROM) of cervical spine on rotation  Rationale for Evaluation and Treatment: Rehabilitation  ONSET DATE: 02/22/2022  SUBJECTIVE:  SUBJECTIVE STATEMENT: Patient reports hands are not too bad today. Reports overall stiffness but no radicular symptoms today.   PERTINENT HISTORY:  Patient is a 86 year old male with recent referral for cervical Radiculopathy. No recent medical visit note with neurosurgery available at this time. Patient has known CAD with stent placement in 2003 and has A-Fib.   PAIN:  Are you having pain? Yes: NPRS scale: 1/10 Pain location: neck Pain description: ache Aggravating factors: Driving long distance, gripping Relieving factors: epsom salt, resting hands on steering wheel  PRECAUTIONS: None  WEIGHT BEARING RESTRICTIONS: No  FALLS:  Has patient fallen in last 6 months? 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    CERVICAL ROM:   Active ROM A/PROM (deg) eval   Flexion 42  Extension 55  Right lateral flexion 23*  Left lateral flexion 28*  Right rotation 48*  Left rotation 52*   (Blank rows = not tested)   UPPER EXTREMITY MMT:  MMT Right eval Left eval  Shoulder flexion 4 4  Shoulder extension 4 4  Shoulder abduction 3+ 3+  Shoulder adduction 4 4  Shoulder internal rotation 4 4  Shoulder external rotation 3+ 3+  Middle trapezius    Lower trapezius    Elbow flexion 4 4  Elbow extension 4 4  Wrist flexion 4 4  Wrist extension 4 4  Wrist ulnar deviation    Wrist radial deviation    Wrist pronation    Wrist supination    Grip strength 19 PSI avg 16 psi avg   (Blank rows = not tested)  CERVICAL SPECIAL TESTS:  Upper limb tension test (ULTT): Negative and Spurling's test: Negative   Repeated Movements No centralization or peripheralization of symptoms with repeated cervical protraction and retraction.       TODAY'S TREATMENT:                                                                                                                              DATE:   Manual therapy:  Cervical distraction with static hold- No report of any radicular symptoms- "just feel a good pull"- 30 sec hold x 3.  Manual stretch to B Levator/Upper trap x 30 sec x 4 each Side Manual stretch to B cervical Rotation-  STM to cervical Paraspinals, UT and levator region- Patient presents  with multiple tight bands/nodules.   THEREX:  Instructed in below activities for HEP:  - Supine Chin Tuck  2 sets - 10 reps (VC for correct technique)  - Chin Tuck   - 2 sets - 10 reps - Seated Upper Trapezius Stretch  - 2 sets - 30 hold - Seated Shoulder Rolls  -  - 3 sets - 10 reps  PATIENT EDUCATION:  Education details: PT plan of care; Educated in signs and symptoms of radiculopathy; anatomy of C-spine  Person educated: Patient Education method: Medical illustrator Education comprehension: verbalized understanding, returned demonstration, and needs  further education  HOME EXERCISE PROGRAM:  Access Code: [email or web address] URL: [email or web address] Date: 03/18/2022 Prepared by: Maureen Ralphs  Exercises - Supine Chin Tuck  - 1 x daily - 7 x weekly - 3 sets - 10 reps - Chin Tuck  - 1 x daily - 7 x weekly - 3 sets - 10 reps - Seated Upper Trapezius Stretch  - 1 x daily - 7 x weekly - 3 sets - 30 hold - Seated Shoulder Rolls  - 1 x daily - 7 x weekly - 3 sets - 10 reps ASSESSMENT:  CLINICAL IMPRESSION: Patient presented with increased cervical tightness with some limited ROM. He responsed very well to manual techniques and reported feeling more flexible with rotation after session (not directly measured today). He responded well to introduction of postural ROM/cervical stretching with good understanding of chin tucks. He will benefit from  PT services for improved cervical ROM; centralization of symptoms for improved overall posture ROM/Strength and improved quality of life.   OBJECTIVE IMPAIRMENTS: decreased activity tolerance, decreased mobility, decreased ROM, decreased strength, hypomobility, impaired sensation, postural dysfunction, and pain.   ACTIVITY LIMITATIONS: carrying, lifting, and reach over head  PARTICIPATION LIMITATIONS: driving, community activity, and yard work  PERSONAL FACTORS: 1-2 comorbidities: HTN, arthritis  are also affecting patient's functional outcome.   REHAB POTENTIAL: Good  CLINICAL DECISION MAKING: Stable/uncomplicated  EVALUATION COMPLEXITY: Low   GOALS: Goals reviewed with patient? Yes  SHORT TERM GOALS: Target date: 04/30/2022   Patient will be independent with HEP in order to improve strength and decrease back pain in order to improve pain-free function at home and work.   Baseline: Eval- Patient has no formal knowledge of exercises to improve his condiiton Goal status: INITIAL    LONG TERM GOALS: Target date: 06/11/2022  Pt will improve FOTO to target score of 83 to display  perceived improvements in ability to complete ADL's.  Baseline: Eval= 81 Goal status: INITIAL  2.  Patient will present with 50% improved cervical Rotation and lateral cervical flexion for improved neck ROM with driving.  Baseline: Eval= c-spine rotation L/R=52/48 deg and SB= 28/23 deg Goal status: INITIAL  3.  Patient will deny and numbness or swelling into B Hands with driving for improved ability to drive community and Leisurely distances.  Baseline: Eval= Patient driving from [place] to [place] with increased pain/numbness/swelling into B hands  Goal status: INITIAL  4.  Pt will decrease worst B hand pain as reported on NPRS by at least 2 points in order to demonstrate clinically significant reduction in back pain.  Baseline: 5/10 B hand pain Goal status: INITIAL     PLAN:  PT FREQUENCY: 1-2x/week  PT DURATION: 12 weeks  PLANNED INTERVENTIONS: Therapeutic exercises, Therapeutic activity, Neuromuscular re-education, Patient/Family education, Self Care, Joint mobilization, Joint manipulation, Dry Needling, Electrical stimulation, Spinal manipulation, Spinal mobilization, Cryotherapy, Moist heat, Traction, and Manual therapy  PLAN  FOR NEXT SESSION: Continue with postural training; manual therapy for ROM - c-spine. Initiate HEP next visit.   Lenda KelpJeffrey N Chamia Schmutz, PT 03/19/2022, 10:42 AM

## 2022-03-23 ENCOUNTER — Ambulatory Visit: Payer: Medicare PPO

## 2022-03-23 DIAGNOSIS — M79642 Pain in left hand: Secondary | ICD-10-CM | POA: Diagnosis not present

## 2022-03-23 DIAGNOSIS — M6281 Muscle weakness (generalized): Secondary | ICD-10-CM | POA: Diagnosis not present

## 2022-03-23 DIAGNOSIS — M79641 Pain in right hand: Secondary | ICD-10-CM | POA: Diagnosis not present

## 2022-03-23 DIAGNOSIS — M5382 Other specified dorsopathies, cervical region: Secondary | ICD-10-CM

## 2022-03-23 NOTE — Therapy (Signed)
OUTPATIENT PHYSICAL THERAPY CERVICAL TREATMENT   Patient Name: Jorge Cherry MRN: [id number] DOB:07/30/1935, 86 y.o., male Today's Date: 03/24/2022   PT End of Session - 03/23/22 0746     Visit Number 3    Number of Visits 24    Date for PT Re-Evaluation 06/08/22    Progress Note Due on Visit 10    PT Start Time 1300    PT Stop Time 1335    PT Time Calculation (min) 35 min    Activity Tolerance Patient tolerated treatment well;No increased pain    Behavior During Therapy WFL for tasks assessed/performed               Past Medical History:  Diagnosis Date   Arthritis    "just a touch in my hands" (11/24/2016)   Asthma    Atherosclerosis of renal artery (HCC)    RENAL DOPPLER, 12/10/2011 - Left renal artery demonstrated narrowing with elevated velocities consistent with a 1-59% diameter reduction   CKD (chronic kidney disease) stage 3, GFR 30-59 ml/min (HCC)    "stable now since they backed off the water pills" (11/24/2016)   Coronary artery disease    a. 1994 s/p CABG x 4 (LIMA-LAD, VG->D2, VG->OM, VG->RCA); b. 02/2004 PCI SVG-D2 (3.5x16 Taxus DES). VG->RCA 100. Sev apical LAD dzs distal to LIMA insertion; c. 11/2016 Cath: LAD 95p/166m, D2 100ost, LCX 100ost, 70p/m, RCA 100p/m, RPDA fills via L->R collats. VG->RCA 100, VG->D2 20 ost, patent prox stent, 69m, LIMA->LAD ok, VG->OM3 20p. EF 55%-->Med Rx.   GERD (gastroesophageal reflux disease)    High cholesterol    History of lower GI bleeding    a. 09/2014 GIB due to diverticulosis/diverticulitis.   Iron deficiency anemia    Labile Hypertension    Moderate aortic stenosis    a. 10/2011 Echo:  EF >55%, mild-mod TR, mild-mod AS, mod Ca2+ of AoV leaflets; b. 07/2016 Echo: EF 60-65%, no rwma, Gr1 DD, mod AS [(S) mean grad , peak grad . Valve area (VTI): 1.33cm^2, (Vmax) 1.44cm^2. Mild MR]; c. 02/2018 Echo: EF 55-60%, no rwma, GR1 DD, Mod AS [Peak Vel (S): 319cm/s, Mean grad (S) , peak grad (S) 38mmHg].   PAF  (paroxysmal atrial fibrillation) (HCC)    a. CHA2DS2VASc = 4-->Eliquis.   S/P TAVR (transcatheter aortic valve replacement) 11/14/2019   s/p TAVR with a 26 mm Edwards S3U via the TF approach by Drs Clifton James & Bartle   Sleep apnea    Past Surgical History:  Procedure Laterality Date   CARDIAC CATHETERIZATION  02/27/2004   Coronary intervention and medical management   CARDIAC CATHETERIZATION  11/24/2016   CARDIOVERSION N/A 12/20/2019   Procedure: CARDIOVERSION;  Surgeon: Chilton Si, MD;  Location: [facility] ENDOSCOPY;  Service: Cardiovascular;  Laterality: N/A;   CARDIOVERSION N/A 02/05/2020   Procedure: CARDIOVERSION;  Surgeon: Wendall Stade, MD;  Location: [facility] ENDOSCOPY;  Service: Cardiovascular;  Laterality: N/A;   CATARACT EXTRACTION W/ INTRAOCULAR LENS IMPLANT Left    CORONARY ANGIOPLASTY  1994 X 2   "before bypass surgery"   CORONARY ANGIOPLASTY WITH STENT PLACEMENT  03/04/2004   SVG supplying the diagonal vessel stented with a 3.5x19mm Taxus stent post dilated to 4.0 mm   CORONARY ARTERY BYPASS GRAFT  1994   "CABG X4"   INGUINAL HERNIA REPAIR Right    PACEMAKER IMPLANT N/A 11/20/2019   Procedure: PACEMAKER IMPLANT;  Surgeon: Duke Salvia, MD;  Location: [facility] INVASIVE CV LAB;  Service: Cardiovascular;  Laterality: N/A;   RIGHT  HEART CATH AND CORONARY/GRAFT ANGIOGRAPHY N/A 11/24/2016   Procedure: Right Heart Cath and Coronary/Graft Angiography;  Surgeon: Troy Sine, MD;  Location: [place] CV LAB;  Service: Cardiovascular;  Laterality: N/A;   RIGHT/LEFT HEART CATH AND CORONARY/GRAFT ANGIOGRAPHY N/A 10/18/2019   Procedure: RIGHT/LEFT HEART CATH AND CORONARY/GRAFT ANGIOGRAPHY;  Surgeon: Troy Sine, MD;  Location: [place] CV LAB;  Service: Cardiovascular;  Laterality: N/A;   TEE WITHOUT CARDIOVERSION N/A 11/14/2019   Procedure: TRANSESOPHAGEAL ECHOCARDIOGRAM (TEE);  Surgeon: Burnell Blanks, MD;  Location: [place] CV LAB;  Service: Open Heart Surgery;   Laterality: N/A;   TONSILLECTOMY AND ADENOIDECTOMY     TRANSCATHETER AORTIC VALVE REPLACEMENT, TRANSFEMORAL N/A 11/14/2019   Procedure: TRANSCATHETER AORTIC VALVE REPLACEMENT, TRANSFEMORAL;  Surgeon: Burnell Blanks, MD;  Location: [place] CV LAB;  Service: Open Heart Surgery;  Laterality: N/A;   Patient Active Problem List   Diagnosis Date Noted   Permanent atrial fibrillation ([place]) 08/14/2021   Acute CVA (cerebrovascular accident) ([place]) 06/30/2021   Gaze palsy    Double vision 06/28/2021   Slurred speech 06/28/2021   Gait disturbance 06/28/2021   Dizziness 06/28/2021   Pacemaker 03/04/2020   Typical atrial flutter ([place])    Persistent atrial fibrillation ([place]) 11/29/2019   Secondary hypercoagulable state ([place]) 11/29/2019   Syncope and collapse 11/20/2019   Heart block AV complete ([place]) 11/20/2019   Renal lesion 11/15/2019   S/P TAVR (transcatheter aortic valve replacement) 11/14/2019   Drug-induced hypothyroidism 01/10/2019   Bradycardia 03/03/2018   Chronic anticoagulation 12/14/2017   Anemia 11/25/2016   Chronic diastolic CHF (congestive heart failure) ([place]) 05/26/2016   Junctional (nodal) bradycardia 11/20/2015   Paroxysmal atrial fibrillation ([place]) 11/20/2015   Hyperlipidemia LDL goal <70 05/03/2015   Mild persistent asthma 03/19/2015   Gastroesophageal reflux disease 03/19/2015   Essential hypertension 02/07/2015   Chest pain 01/22/2015   Pain in the chest    Chronic renal insufficiency, stage III (moderate) ([place]) 12/28/2014   Lower GI bleed 09/24/2014   Diverticulosis of colon with hemorrhage 09/24/2014   Symptomatic anemia 09/24/2014   Severe aortic stenosis 07/22/2013   Hyperlipidemia with target LDL less than 70 10/27/2012   Hearing decreased 10/27/2012   Hx of CABG 10/27/2012   Coronary atherosclerosis of native coronary artery 10/27/2012    PCP:  Dr. Prince Solian  REFERRING PROVIDER: Eleonore Chiquito, NP  REFERRING DIAG: [phone]  (ICD-10-CM) - Radiculopathy, cervical region   THERAPY DIAG:  Muscle weakness (generalized)  Bilateral hand pain  Limited active range of motion (AROM) of cervical spine on rotation  Rationale for Evaluation and Treatment: Rehabilitation  ONSET DATE: 02/22/2022  SUBJECTIVE:  SUBJECTIVE STATEMENT: Patient reports no significant changes. States only tried his HEP a couple of times since last visit.  Reports ongoing swelling and stiffness but no numbness in hands today. Patient reports not of this started until he started taking prednisone and that he stopped a couple of weeks ago.    PERTINENT HISTORY:  Patient is a 86 year old male with recent referral for cervical Radiculopathy. No recent medical visit note with neurosurgery available at this time. Patient has known CAD with stent placement in 2003 and has A-Fib.   PAIN:  Are you having pain? Yes: NPRS scale: 0/10 Pain location: hand Pain description: ache Aggravating factors: Driving long distance, gripping Relieving factors: epsom salt, resting hands on steering wheel  PRECAUTIONS: None  WEIGHT BEARING RESTRICTIONS: No  FALLS:  Has patient fallen in last 6 months? No  LIVING ENVIRONMENT: Lives with: lives with their spouse Lives in: House/apartment Stairs: Yes: External: 3 steps; on left going up Has following equipment at home: None  OCCUPATION: Retired- worked with [employer]. [employer]  PLOF: Independent  PATIENT GOALS: To have no pain or numbness in my hands  NEXT MD VISIT: Uncertain  OBJECTIVE:   DIAGNOSTIC FINDINGS:  No recent diagnostic testing  PATIENT SURVEYS:  FOTO 81  COGNITION: Overall cognitive status: Within functional limits for tasks assessed  SENSATION: Light  touch: WFL  POSTURE: rounded shoulders and forward head  PALPATION: + tenderness along right cervical region and several tight bands across R UT region.    CERVICAL ROM:   Active ROM A/PROM (deg) eval  Flexion 42  Extension 55  Right lateral flexion 23*  Left lateral flexion 28*  Right rotation 48*  Left rotation 52*   (Blank rows = not tested)   UPPER EXTREMITY MMT:  MMT Right eval Left eval  Shoulder flexion 4 4  Shoulder extension 4 4  Shoulder abduction 3+ 3+  Shoulder adduction 4 4  Shoulder internal rotation 4 4  Shoulder external rotation 3+ 3+  Middle trapezius    Lower trapezius    Elbow flexion 4 4  Elbow extension 4 4  Wrist flexion 4 4  Wrist extension 4 4  Wrist ulnar deviation    Wrist radial deviation    Wrist pronation    Wrist supination    Grip strength 19 PSI avg 16 psi avg   (Blank rows = not tested)  CERVICAL SPECIAL TESTS:  Upper limb tension test (ULTT): Negative and Spurling's test: Negative   Repeated Movements No centralization or peripheralization of symptoms with repeated cervical protraction and retraction.       TODAY'S TREATMENT:                                                                                                                              DATE:   Discussion about patient current swelling and intermittent numbness into bilateral hands. Patient stated the numbness is still there some but not  as bad. Discussed the swelling in his hands and Legs could be the result of side effect from recent taper dose of prednisone and that if his swelling is persisting that he needs to consult his MD. He verbalized understanding- stating he does currently have a follow up but would make one if needed.   Manual therapy:  Cervical distraction with static hold- 60 sec hold x 3.  Manual stretch to B Levator/Upper trap x 30 sec x 4 each Side Manual stretch to B cervical Rotation- x 30 sec x 4 each Side STM to cervical Paraspinals, UT  and levator region- Patient presents with multiple tight bands/nodules.  Retrograde massage to B hands to reduce edema. Measured edema at 2 landmarks each hand (around hand at MCP joints and wrist (rad/ulnar styloids)  Left = wrist-20.0 cm; MCP jt-23.5 cm Right=19.5 cm; MCP jt- 23.0 com  THEREX:  Reviewed - Supine Chin Tuck  1 sets - 10 reps (VC for correct technique)  -importance of keeping LE elevated to reduce swelling -Seated UT stretch  PATIENT EDUCATION:  Education details: PT plan of care; Educated in signs and symptoms of radiculopathy; anatomy of C-spine  Person educated: Patient Education method: Customer service manager Education comprehension: verbalized understanding, returned demonstration, and needs further education  HOME EXERCISE PROGRAM:  Access Code: [email or web address] URL: [email or web address] Date: 03/18/2022 Prepared by: Sande Brothers  Exercises - Supine Chin Tuck  - 1 x daily - 7 x weekly - 3 sets - 10 reps - Chin Tuck  - 1 x daily - 7 x weekly - 3 sets - 10 reps - Seated Upper Trapezius Stretch  - 1 x daily - 7 x weekly - 3 sets - 30 hold - Seated Shoulder Rolls  - 1 x daily - 7 x weekly - 3 sets - 10 reps ASSESSMENT:  CLINICAL IMPRESSION: Patient presented with no cervical or hand pain. Continued hands and LE swelling and reviewed importance of following up with MD regarding these issues. Patient to cancel his appointment on Wed and practice his HEP and return next Wednesday for reassessment. He was less tight with rotation today and continued with no radicular symptoms. He will benefit from  PT services for improved cervical ROM; centralization of symptoms for improved overall posture ROM/Strength and improved quality of life.   OBJECTIVE IMPAIRMENTS: decreased activity tolerance, decreased mobility, decreased ROM, decreased strength, hypomobility, impaired sensation, postural dysfunction, and pain.   ACTIVITY LIMITATIONS: carrying,  lifting, and reach over head  PARTICIPATION LIMITATIONS: driving, community activity, and yard work  PERSONAL FACTORS: 1-2 comorbidities: HTN, arthritis  are also affecting patient's functional outcome.   REHAB POTENTIAL: Good  CLINICAL DECISION MAKING: Stable/uncomplicated  EVALUATION COMPLEXITY: Low   GOALS: Goals reviewed with patient? Yes  SHORT TERM GOALS: Target date: 05/05/2022   Patient will be independent with HEP in order to improve strength and decrease back pain in order to improve pain-free function at home and work.   Baseline: Eval- Patient has no formal knowledge of exercises to improve his condiiton Goal status: INITIAL    LONG TERM GOALS: Target date: 06/16/2022  Pt will improve FOTO to target score of 83 to display perceived improvements in ability to complete ADL's.  Baseline: Eval= 81 Goal status: INITIAL  2.  Patient will present with 50% improved cervical Rotation and lateral cervical flexion for improved neck ROM with driving.  Baseline: Eval= c-spine rotation L/R=52/48 deg and SB= 28/23 deg Goal status: INITIAL  3.  Patient will deny and  numbness or swelling into B Hands with driving for improved ability to drive community and Leisurely distances.  Baseline: Eval= Patient driving from [place] to [place] with increased pain/numbness/swelling into B hands  Goal status: INITIAL  4.  Pt will decrease worst B hand pain as reported on NPRS by at least 2 points in order to demonstrate clinically significant reduction in back pain.  Baseline: 5/10 B hand pain Goal status: INITIAL     PLAN:  PT FREQUENCY: 1-2x/week  PT DURATION: 12 weeks  PLANNED INTERVENTIONS: Therapeutic exercises, Therapeutic activity, Neuromuscular re-education, Patient/Family education, Self Care, Joint mobilization, Joint manipulation, Dry Needling, Electrical stimulation, Spinal manipulation, Spinal mobilization, Cryotherapy, Moist heat, Traction, and Manual  therapy  PLAN FOR NEXT SESSION: Continue with postural training; manual therapy for ROM - c-spine. Initiate HEP next visit.   Lewis Moccasin, PT 03/24/2022, 8:00 AM

## 2022-03-25 ENCOUNTER — Ambulatory Visit: Payer: Medicare PPO

## 2022-03-26 ENCOUNTER — Telehealth: Payer: Self-pay | Admitting: Cardiovascular Disease

## 2022-03-26 NOTE — Telephone Encounter (Signed)
Spoke to patient's wife she stated husband has been having more swelling in right lower leg.Right leg is leg a vein was removed when he CABG.Stated for the past 2 weeks he has been driving to [place] 2 hours one way twice a week helping with building a ramp at son's home.Stated he just finished taking prednisone for sore hands.He has been eating out more and eating more salt.He takes Furosemide 20 mg twice a day.He does not want to take 2 tablets in morning due to having to drive to [place]. Advised to decrease salt.Advised to wear compression stockings.Keep feet and legs elevated when sitting.Stated he always has alittle sob.Sob no worse.Advised to keep appointment already scheduled with Dr.Kelly 12/5 at 4:20 pm.I will make Dr.Kelly aware.

## 2022-03-26 NOTE — Telephone Encounter (Signed)
Pt c/o swelling: STAT is pt has developed SOB within 24 hours  How much weight have you gained and in what time span? Not sure   If swelling, where is the swelling located? Right leg   Are you currently taking a fluid pill? Yes   Are you currently SOB? No   Do you have a log of your daily weights (if so, list)? No  Have you gained 3 pounds in a day or 5 pounds in a week? No  Have you traveled recently? To different doctors appts. Pt's wife states that the fluid pill or all the driving is making him dizzy.

## 2022-03-30 ENCOUNTER — Telehealth: Payer: Self-pay | Admitting: Allergy and Immunology

## 2022-03-30 ENCOUNTER — Other Ambulatory Visit: Payer: Self-pay | Admitting: Allergy and Immunology

## 2022-03-30 MED ORDER — ALBUTEROL SULFATE HFA 108 (90 BASE) MCG/ACT IN AERS: INHALATION_SPRAY | RESPIRATORY_TRACT | 0 refills | Status: AC

## 2022-03-30 NOTE — Telephone Encounter (Signed)
Jorge Cherry called in and states they are out of town and he is having an asthma flare.  The Ventolin inhaler he has is expired for 2022 and he has been using that, but not getting relief.  They tried to refill the one at the pharmacy but the prescription is expired.  I told them they were due for an appointment and they stated there is no way they could get here right now.  They are asking for a courtesy refill of Ventolin be sent to [facility] in [place], [place] so their son can pick it up and bring it out to them.  Please advise.

## 2022-03-30 NOTE — Telephone Encounter (Signed)
Refill sent into requested pharmacy. Called patient and let him know that the medication is waiting at the pharmacy for his son to pick up for him.

## 2022-03-31 ENCOUNTER — Other Ambulatory Visit: Payer: Self-pay | Admitting: Allergy and Immunology

## 2022-04-01 ENCOUNTER — Ambulatory Visit: Payer: Medicare PPO

## 2022-04-01 DIAGNOSIS — I130 Hypertensive heart and chronic kidney disease with heart failure and stage 1 through stage 4 chronic kidney disease, or unspecified chronic kidney disease: Secondary | ICD-10-CM | POA: Diagnosis not present

## 2022-04-01 DIAGNOSIS — I480 Paroxysmal atrial fibrillation: Secondary | ICD-10-CM | POA: Diagnosis not present

## 2022-04-01 DIAGNOSIS — M5382 Other specified dorsopathies, cervical region: Secondary | ICD-10-CM

## 2022-04-01 DIAGNOSIS — M79642 Pain in left hand: Secondary | ICD-10-CM

## 2022-04-01 DIAGNOSIS — R051 Acute cough: Secondary | ICD-10-CM | POA: Diagnosis not present

## 2022-04-01 DIAGNOSIS — I5032 Chronic diastolic (congestive) heart failure: Secondary | ICD-10-CM | POA: Diagnosis not present

## 2022-04-01 DIAGNOSIS — N1831 Chronic kidney disease, stage 3a: Secondary | ICD-10-CM | POA: Diagnosis not present

## 2022-04-01 DIAGNOSIS — M79641 Pain in right hand: Secondary | ICD-10-CM | POA: Diagnosis not present

## 2022-04-01 DIAGNOSIS — J069 Acute upper respiratory infection, unspecified: Secondary | ICD-10-CM | POA: Diagnosis not present

## 2022-04-01 DIAGNOSIS — M6281 Muscle weakness (generalized): Secondary | ICD-10-CM | POA: Diagnosis not present

## 2022-04-01 DIAGNOSIS — R600 Localized edema: Secondary | ICD-10-CM | POA: Diagnosis not present

## 2022-04-01 NOTE — Therapy (Signed)
OUTPATIENT PHYSICAL THERAPY CERVICAL TREATMENT   Patient Name: Jorge Cherry MRN: [id number] DOB:[birth year], 86 y.o., male Today's Date: 04/01/2022   PT End of Session - 04/01/22 1406     Visit Number 4    Number of Visits 24    Date for PT Re-Evaluation 06/08/22    Progress Note Due on Visit 10    PT Start Time 1312    PT Stop Time 1340    PT Time Calculation (min) 28 min    Activity Tolerance Patient tolerated treatment well;No increased pain    Behavior During Therapy WFL for tasks assessed/performed                Past Medical History:  Diagnosis Date   Arthritis    "just a touch in my hands" (11/24/2016)   Asthma    Atherosclerosis of renal artery (HCC)    RENAL DOPPLER, 12/10/2011 - Left renal artery demonstrated narrowing with elevated velocities consistent with a 1-59% diameter reduction   CKD (chronic kidney disease) stage 3, GFR 30-59 ml/min (HCC)    "stable now since they backed off the water pills" (11/24/2016)   Coronary artery disease    a. 1994 s/p CABG x 4 (LIMA-LAD, VG->D2, VG->OM, VG->RCA); b. 02/2004 PCI SVG-D2 (3.5x16 Taxus DES). VG->RCA 100. Sev apical LAD dzs distal to LIMA insertion; c. 11/2016 Cath: LAD 95p/125m, D2 100ost, LCX 100ost, 70p/m, RCA 100p/m, RPDA fills via L->R collats. VG->RCA 100, VG->D2 20 ost, patent prox stent, 78m, LIMA->LAD ok, VG->OM3 20p. EF 55%-->Med Rx.   GERD (gastroesophageal reflux disease)    High cholesterol    History of lower GI bleeding    a. 09/2014 GIB due to diverticulosis/diverticulitis.   Iron deficiency anemia    Labile Hypertension    Moderate aortic stenosis    a. 10/2011 Echo:  EF >55%, mild-mod TR, mild-mod AS, mod Ca2+ of AoV leaflets; b. 07/2016 Echo: EF 60-65%, no rwma, Gr1 DD, mod AS [(S) mean grad , peak grad . Valve area (VTI): 1.33cm^2, (Vmax) 1.44cm^2. Mild MR]; c. 02/2018 Echo: EF 55-60%, no rwma, GR1 DD, Mod AS [Peak Vel (S): 319cm/s, Mean grad (S) , peak grad (S) 23mmHg].   PAF  (paroxysmal atrial fibrillation) (HCC)    a. CHA2DS2VASc = 4-->Eliquis.   S/P TAVR (transcatheter aortic valve replacement) 11/14/2019   s/p TAVR with a 26 mm Edwards S3U via the TF approach by Drs Clifton James & Bartle   Sleep apnea    Past Surgical History:  Procedure Laterality Date   CARDIAC CATHETERIZATION  02/27/2004   Coronary intervention and medical management   CARDIAC CATHETERIZATION  11/24/2016   CARDIOVERSION N/A 12/20/2019   Procedure: CARDIOVERSION;  Surgeon: Chilton Si, MD;  Location: [facility] ENDOSCOPY;  Service: Cardiovascular;  Laterality: N/A;   CARDIOVERSION N/A 02/05/2020   Procedure: CARDIOVERSION;  Surgeon: Wendall Stade, MD;  Location: [facility] ENDOSCOPY;  Service: Cardiovascular;  Laterality: N/A;   CATARACT EXTRACTION W/ INTRAOCULAR LENS IMPLANT Left    CORONARY ANGIOPLASTY  1994 X 2   "before bypass surgery"   CORONARY ANGIOPLASTY WITH STENT PLACEMENT  03/04/2004   SVG supplying the diagonal vessel stented with a 3.5x69mm Taxus stent post dilated to 4.0 mm   CORONARY ARTERY BYPASS GRAFT  1994   "CABG X4"   INGUINAL HERNIA REPAIR Right    PACEMAKER IMPLANT N/A 11/20/2019   Procedure: PACEMAKER IMPLANT;  Surgeon: Duke Salvia, MD;  Location: [facility] INVASIVE CV LAB;  Service: Cardiovascular;  Laterality: N/A;  RIGHT HEART CATH AND CORONARY/GRAFT ANGIOGRAPHY N/A 11/24/2016   Procedure: Right Heart Cath and Coronary/Graft Angiography;  Surgeon: Lennette BihariKelly, Thomas A, MD;  [facility];  Service: Cardiovascular;  Laterality: N/A;   RIGHT/LEFT HEART CATH AND CORONARY/GRAFT ANGIOGRAPHY N/A 10/18/2019   Procedure: RIGHT/LEFT HEART CATH AND CORONARY/GRAFT ANGIOGRAPHY;  Surgeon: Lennette BihariKelly, Thomas A, MD;  Location: [facility] INVASIVE CV LAB;  Service: Cardiovascular;  Laterality: N/A;   TEE WITHOUT CARDIOVERSION N/A 11/14/2019   Procedure: TRANSESOPHAGEAL ECHOCARDIOGRAM (TEE);  Surgeon: Kathleene HazelMcAlhany, Christopher D, MD;  [facility];  Service: Open Heart Surgery;   Laterality: N/A;   TONSILLECTOMY AND ADENOIDECTOMY     TRANSCATHETER AORTIC VALVE REPLACEMENT, TRANSFEMORAL N/A 11/14/2019   Procedure: TRANSCATHETER AORTIC VALVE REPLACEMENT, TRANSFEMORAL;  Surgeon: Kathleene HazelMcAlhany, Christopher D, MD;  Location: [facility] INVASIVE CV LAB;  Service: Open Heart Surgery;  Laterality: N/A;   Patient Active Problem List   Diagnosis Date Noted   Permanent atrial fibrillation (HCC) 08/14/2021   Acute CVA (cerebrovascular accident) (HCC) 06/30/2021   Gaze palsy    Double vision 06/28/2021   Slurred speech 06/28/2021   Gait disturbance 06/28/2021   Dizziness 06/28/2021   Pacemaker 03/04/2020   Typical atrial flutter (HCC)    Persistent atrial fibrillation (HCC) 11/29/2019   Secondary hypercoagulable state (HCC) 11/29/2019   Syncope and collapse 11/20/2019   Heart block AV complete (HCC) 11/20/2019   Renal lesion 11/15/2019   S/P TAVR (transcatheter aortic valve replacement) 11/14/2019   Drug-induced hypothyroidism 01/10/2019   Bradycardia 03/03/2018   Chronic anticoagulation 12/14/2017   Anemia 11/25/2016   Chronic diastolic CHF (congestive heart failure) (HCC) 05/26/2016   Junctional (nodal) bradycardia 11/20/2015   Paroxysmal atrial fibrillation (HCC) 11/20/2015   Hyperlipidemia LDL goal <70 05/03/2015   Mild persistent asthma 03/19/2015   Gastroesophageal reflux disease 03/19/2015   Essential hypertension 02/07/2015   Chest pain 01/22/2015   Pain in the chest    Chronic renal insufficiency, stage III (moderate) (HCC) 12/28/2014   Lower GI bleed 09/24/2014   Diverticulosis of colon with hemorrhage 09/24/2014   Symptomatic anemia 09/24/2014   Severe aortic stenosis 07/22/2013   Hyperlipidemia with target LDL less than 70 10/27/2012   Hearing decreased 10/27/2012   Hx of CABG 10/27/2012   Coronary atherosclerosis of native coronary artery 10/27/2012    PCP:  Dr. Chilton Greathouseavisankar Avva  REFERRING PROVIDER: Sherryl MangesKimberly Hannah Meyran, NP  REFERRING DIAG: [phone]  (ICD-10-CM) - Radiculopathy, cervical region   THERAPY DIAG:  Muscle weakness (generalized)  Bilateral hand pain  Limited active range of motion (AROM) of cervical spine on rotation  Rationale for Evaluation and Treatment: Rehabilitation  ONSET DATE: 02/22/2022  SUBJECTIVE:  SUBJECTIVE STATEMENT: Patient reports no significant changes. States only tried his HEP a couple of times since last visit.  Reports ongoing swelling and stiffness but no numbness in hands today. Patient reports not of this started until he started taking prednisone and that he stopped a couple of weeks ago.    PERTINENT HISTORY:  Patient is a 86 year old male with recent referral for cervical Radiculopathy. No recent medical visit note with neurosurgery available at this time. Patient has known CAD with stent placement in 2003 and has A-Fib.   PAIN:  Are you having pain? Yes: NPRS scale: 0/10 Pain location: hand Pain description: ache Aggravating factors: Driving long distance, gripping Relieving factors: epsom salt, resting hands on steering wheel  PRECAUTIONS: None  WEIGHT BEARING RESTRICTIONS: No  FALLS:  Has patient fallen in last 6 months? 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    CERVICAL ROM:   Active ROM A/PROM (deg) eval  Flexion 42  Extension 55  Right lateral flexion 23*  Left lateral flexion 28*  Right rotation 48*  Left rotation 52*   (Blank rows = not tested)   UPPER EXTREMITY MMT:  MMT Right eval Left eval  Shoulder flexion 4 4  Shoulder extension 4 4  Shoulder abduction 3+ 3+  Shoulder adduction 4 4  Shoulder internal rotation 4 4  Shoulder external rotation 3+ 3+  Middle trapezius    Lower trapezius    Elbow flexion 4 4  Elbow extension 4 4  Wrist flexion 4 4  Wrist extension 4 4  Wrist ulnar deviation    Wrist radial deviation    Wrist pronation    Wrist supination    Grip strength 19 PSI avg 16 psi avg   (Blank rows = not tested)  CERVICAL SPECIAL TESTS:  Upper limb tension test (ULTT): Negative and Spurling's test: Negative   Repeated Movements No centralization or peripheralization of symptoms with repeated cervical protraction and retraction.       TODAY'S TREATMENT:                                                                                                                              DATE:   Manual therapy:  Cervical distraction with static hold- 60 sec hold x 3.  Supine- Manual stretch to B Levator/Upper trap  x 30 sec x 4 each Side Supine Manual stretch to B cervical Rotation- x 30 sec x 4 each Side Grade 2-3 cervical upward side glide C4-6 each side x 30 bouts at each vertebrae.  STM to cervical Paraspinals, UT and levator region- Patient presents with multiple tight bands/nodules.  Retrograde massage to B hands to reduce edema. Measured edema at 2 landmarks each hand (around hand at MCP joints and wrist (rad/ulnar styloids)  Left = wrist-20.0 cm; MCP jt-23. cm Right=20.0 cm; MCP jt- 24.0 com  THEREX:  Reviewed - Supine Chin Tuck  1 sets - 10  reps (VC for correct technique)  -importance of keeping LE elevated to reduce swelling -Scapular retraction with BTB x 15 reps (patient denied any cervical pain or numbness into hands. -Wall posture stretch- hold 60 sec x 1- added to HEP verbally with patient  PATIENT EDUCATION:  Education details: PT plan of care; Educated in signs and symptoms of radiculopathy; anatomy of C-spine  Person educated: Patient Education method: Medical illustrator Education comprehension: verbalized understanding, returned demonstration, and needs further education  HOME EXERCISE PROGRAM:  Access Code: [email or web address] URL: [email or web address] Date: 03/18/2022 Prepared by: Maureen Ralphs  Exercises - Supine Chin Tuck  - 1 x daily - 7 x weekly - 3 sets - 10 reps - Chin Tuck  - 1 x daily - 7 x weekly - 3 sets - 10 reps - Seated Upper Trapezius Stretch  - 1 x daily - 7 x weekly - 3 sets - 30 hold - Seated Shoulder Rolls  - 1 x daily - 7 x weekly - 3 sets - 10 reps ASSESSMENT:  CLINICAL IMPRESSION: Treatment limited secondary to patient late arrival. Patient presents with no pain during session. He was less restrictive with cervical mobility and edema still present. He has appointment with his PCP later today. Will wait for MD assessment to see thoughts on cause of swelling. Continued to progress postural strengthening and no issues during session today. Patient will benefit from  PT services for improved cervical ROM; centralization of symptoms for improved overall posture ROM/Strength and improved quality of life.   OBJECTIVE IMPAIRMENTS: decreased activity tolerance, decreased mobility, decreased ROM, decreased strength, hypomobility, impaired sensation, postural dysfunction, and pain.   ACTIVITY LIMITATIONS: carrying, lifting, and reach over head  PARTICIPATION LIMITATIONS: driving, community activity, and yard work  PERSONAL FACTORS: 1-2 comorbidities: HTN, arthritis  are also  affecting patient's functional outcome.   REHAB POTENTIAL: Good  CLINICAL DECISION MAKING: Stable/uncomplicated  EVALUATION COMPLEXITY: Low   GOALS: Goals reviewed with patient? Yes  SHORT TERM GOALS: Target date: 05/13/2022   Patient will be independent with HEP in order to improve strength and decrease back pain in order to improve pain-free function at home and work.   Baseline: Eval- Patient has no formal knowledge of exercises to improve his condiiton Goal status: INITIAL    LONG TERM GOALS: Target date: 06/24/2022  Pt will improve FOTO to target score of 83 to display perceived improvements in ability to complete ADL's.  Baseline: Eval= 81 Goal status: INITIAL  2.  Patient will present with 50% improved cervical Rotation and lateral cervical flexion for improved neck ROM with driving.  Baseline: Eval= c-spine rotation L/R=52/48 deg and SB= 28/23 deg Goal status: INITIAL  3.  Patient will deny and numbness or swelling into B Hands with driving for improved ability to drive community and Leisurely distances.  Baseline: Eval= Patient driving from [place] to [place] with increased pain/numbness/swelling into  B hands  Goal status: INITIAL  4.  Pt will decrease worst B hand pain as reported on NPRS by at least 2 points in order to demonstrate clinically significant reduction in back pain.  Baseline: 5/10 B hand pain Goal status: INITIAL     PLAN:  PT FREQUENCY: 1-2x/week  PT DURATION: 12 weeks  PLANNED INTERVENTIONS: Therapeutic exercises, Therapeutic activity, Neuromuscular re-education, Patient/Family education, Self Care, Joint mobilization, Joint manipulation, Dry Needling, Electrical stimulation, Spinal manipulation, Spinal mobilization, Cryotherapy, Moist heat, Traction, and Manual therapy  PLAN FOR NEXT SESSION: Continue with postural training; manual therapy for ROM - c-spine. Initiate HEP next visit.   Lenda Kelp, PT 04/01/2022, 2:07  PM

## 2022-04-06 ENCOUNTER — Ambulatory Visit: Payer: Medicare PPO

## 2022-04-08 ENCOUNTER — Ambulatory Visit: Payer: Medicare PPO

## 2022-04-13 ENCOUNTER — Ambulatory Visit: Payer: Medicare PPO | Attending: Student

## 2022-04-13 DIAGNOSIS — M5382 Other specified dorsopathies, cervical region: Secondary | ICD-10-CM | POA: Diagnosis not present

## 2022-04-13 DIAGNOSIS — M6281 Muscle weakness (generalized): Secondary | ICD-10-CM | POA: Diagnosis not present

## 2022-04-13 DIAGNOSIS — M79641 Pain in right hand: Secondary | ICD-10-CM | POA: Diagnosis not present

## 2022-04-13 DIAGNOSIS — M79642 Pain in left hand: Secondary | ICD-10-CM | POA: Diagnosis not present

## 2022-04-13 NOTE — Therapy (Addendum)
OUTPATIENT PHYSICAL THERAPY CERVICAL TREATMENT/DISCHARGE VISIT   Patient Name: Jorge Cherry MRN: [id number] DOB:05/18/1935, 86 y.o., male Today's Date: 04/13/2022   PT End of Session - 04/13/22 1308     Visit Number 5    Number of Visits 24    Date for PT Re-Evaluation 06/08/22    Progress Note Due on Visit 10    PT Start Time 1300    PT Stop Time 1333    PT Time Calculation (min) 33 min    Activity Tolerance Patient tolerated treatment well;No increased pain    Behavior During Therapy WFL for tasks assessed/performed                 Past Medical History:  Diagnosis Date   Arthritis    "just a touch in my hands" (11/24/2016)   Asthma    Atherosclerosis of renal artery ([place])    RENAL DOPPLER, 12/10/2011 - Left renal artery demonstrated narrowing with elevated velocities consistent with a 1-59% diameter reduction   CKD (chronic kidney disease) stage 3, GFR 30-59 ml/min (HCC)    "stable now since they backed off the water pills" (11/24/2016)   Coronary artery disease    a. 1994 s/p CABG x 4 (LIMA-LAD, VG->D2, VG->OM, VG->RCA); b. 02/2004 PCI SVG-D2 (3.5x16 Taxus DES). VG->RCA 100. Sev apical LAD dzs distal to LIMA insertion; c. 11/2016 Cath: LAD 95p/112m D2 100ost, LCX 100ost, 70p/m, RCA 100p/m, RPDA fills via L->R collats. VG->RCA 100, VG->D2 20 ost, patent prox stent, 446mLIMA->LAD ok, VG->OM3 20p. EF 55%-->Med Rx.   GERD (gastroesophageal reflux disease)    High cholesterol    History of lower GI bleeding    a. 09/2014 GIB due to diverticulosis/diverticulitis.   Iron deficiency anemia    Labile Hypertension    Moderate aortic stenosis    a. 10/2011 Echo:  EF >55%, mild-mod TR, mild-mod AS, mod Ca2+ of AoV leaflets; b. 07/2016 Echo: EF 60-65%, no rwma, Gr1 DD, mod AS [(S) mean grad 2128m, peak grad 46m82m Valve area (VTI): 1.33cm^2, (Vmax) 1.44cm^2. Mild MR]; c. 02/2018 Echo: EF 55-60%, no rwma, GR1 DD, Mod AS [Peak Vel (S): 319cm/s, Mean grad (S) 24mm7mpeak grad (S)  41mmH58m  PAF (paroxysmal atrial fibrillation) (HCC)    a. CHA2DS2VASc = 4-->Eliquis.   S/P TAVR (transcatheter aortic valve replacement) 11/14/2019   s/p TAVR with a 26 mm Edwards S3U via the TF approach by Drs McAlhaAngelena Formtle   Sleep apnea    Past Surgical History:  Procedure Laterality Date   CARDIAC CATHETERIZATION  02/27/2004   Coronary intervention and medical management   CARDIAC CATHETERIZATION  11/24/2016   CARDIOVERSION N/A 12/20/2019   Procedure: CARDIOVERSION;  Surgeon: RandolSkeet Latch [facility];  Laterality: N/A;   CARDIOVERSION N/A 02/05/2020   Procedure: CARDIOVERSION;  Surgeon: NishanJosue Hector [facility];  Service: Cardiovascular;  Laterality: N/A;   CATARACT EXTRACTION W/ INTRAOCULAR LENS IMPLANT Left    CORONARY ANGIOPLASTY  1994 X 2   "before bypass surgery"   CORONARY ANGIOPLASTY WITH STENT PLACEMENT  03/04/2004   SVG supplying the diagonal vessel stented with a 3.5x16mm T109m stent post dilated to 4.0 mm   CORONARY ARTERY BYPASS GRAFT  1994   "CABG X4"   INGUINAL HERNIA REPAIR Right    PACEMAKER IMPLANT N/A 11/20/2019   Procedure: PACEMAKER IMPLANT;  Surgeon: Klein, Deboraha SprangLocation: [facility];  Service: Cardiovascular;  Laterality: N/A;  RIGHT HEART CATH AND CORONARY/GRAFT ANGIOGRAPHY N/A 11/24/2016   Procedure: Right Heart Cath and Coronary/Graft Angiography;  Surgeon: Troy Sine, MD;  Location: [place] [facility];  Service: Cardiovascular;  Laterality: N/A;   RIGHT/LEFT HEART CATH AND CORONARY/GRAFT ANGIOGRAPHY N/A 10/18/2019   Procedure: RIGHT/LEFT HEART CATH AND CORONARY/GRAFT ANGIOGRAPHY;  Surgeon: Troy Sine, MD;  Location: [place] [facility];  Service: Cardiovascular;  Laterality: N/A;   TEE WITHOUT CARDIOVERSION N/A 11/14/2019   Procedure: TRANSESOPHAGEAL ECHOCARDIOGRAM (TEE);  Surgeon: Burnell Blanks, MD;  Location: [place] [facility];  Service: Open Heart  Surgery;  Laterality: N/A;   TONSILLECTOMY AND ADENOIDECTOMY     TRANSCATHETER AORTIC VALVE REPLACEMENT, TRANSFEMORAL N/A 11/14/2019   Procedure: TRANSCATHETER AORTIC VALVE REPLACEMENT, TRANSFEMORAL;  Surgeon: Burnell Blanks, MD;  Location: [place] [facility];  Service: Open Heart Surgery;  Laterality: N/A;   Patient Active Problem List   Diagnosis Date Noted   Permanent atrial fibrillation ([place]) 08/14/2021   Acute CVA (cerebrovascular accident) ([place]) 06/30/2021   Gaze palsy    Double vision 06/28/2021   Slurred speech 06/28/2021   Gait disturbance 06/28/2021   Dizziness 06/28/2021   Pacemaker 03/04/2020   Typical atrial flutter ([place])    Persistent atrial fibrillation ([place]) 11/29/2019   Secondary hypercoagulable state ([place]) 11/29/2019   Syncope and collapse 11/20/2019   Heart block AV complete ([place]) 11/20/2019   Renal lesion 11/15/2019   S/P TAVR (transcatheter aortic valve replacement) 11/14/2019   Drug-induced hypothyroidism 01/10/2019   Bradycardia 03/03/2018   Chronic anticoagulation 12/14/2017   Anemia 11/25/2016   Chronic diastolic CHF (congestive heart failure) ([place]) 05/26/2016   Junctional (nodal) bradycardia 11/20/2015   Paroxysmal atrial fibrillation ([place]) 11/20/2015   Hyperlipidemia LDL goal <70 05/03/2015   Mild persistent asthma 03/19/2015   Gastroesophageal reflux disease 03/19/2015   Essential hypertension 02/07/2015   Chest pain 01/22/2015   Pain in the chest    Chronic renal insufficiency, stage III (moderate) ([place]) 12/28/2014   Lower GI bleed 09/24/2014   Diverticulosis of colon with hemorrhage 09/24/2014   Symptomatic anemia 09/24/2014   Severe aortic stenosis 07/22/2013   Hyperlipidemia with target LDL less than 70 10/27/2012   Hearing decreased 10/27/2012   Hx of CABG 10/27/2012   Coronary atherosclerosis of native coronary artery 10/27/2012    PCP:  Dr. Prince Solian  REFERRING PROVIDER: Eleonore Chiquito, NP  REFERRING DIAG:  [phone] (ICD-10-CM) - Radiculopathy, cervical region   THERAPY DIAG:  Muscle weakness (generalized)  Bilateral hand pain  Limited active range of motion (AROM) of cervical spine on rotation  Rationale for Evaluation and Treatment: Rehabilitation  ONSET DATE: 02/22/2022  SUBJECTIVE:  SUBJECTIVE STATEMENT: Patient reports his swelling is still there- much more prominent in his legs (right more than left)  and not as bad in his hands. States he is taking his lasix as instructed. Reports persistent cough.    PERTINENT HISTORY:  Patient is a 86 year old male with recent referral for cervical Radiculopathy. No recent medical visit note with neurosurgery available at this time. Patient has known CAD with stent placement in 2003 and has A-Fib.   PAIN:  Are you having pain? Yes: NPRS scale: 0/10 Pain location: hand Pain description: ache Aggravating factors: Driving long distance, gripping Relieving factors: epsom salt, resting hands on steering wheel  PRECAUTIONS: None  WEIGHT BEARING RESTRICTIONS: No  FALLS:  Has patient fallen in last 6 months? No  LIVING ENVIRONMENT: Lives with: lives with their spouse Lives in: House/apartment Stairs: Yes: External: 3 steps; on left going up Has following equipment at home: None  OCCUPATION: Retired- worked with [employer]. [employer]  PLOF: Independent  PATIENT GOALS: To have no pain or numbness in my hands  NEXT MD VISIT: Uncertain  OBJECTIVE:   DIAGNOSTIC FINDINGS:  No recent diagnostic testing  PATIENT SURVEYS:  FOTO 81  COGNITION: Overall cognitive status: Within functional limits for tasks assessed  SENSATION: Light touch: WFL  POSTURE: rounded shoulders and forward head  PALPATION: + tenderness  along right cervical region and several tight bands across R UT region.    CERVICAL ROM:   Active ROM A/PROM (deg) eval  Flexion 42  Extension 55  Right lateral flexion 23*  Left lateral flexion 28*  Right rotation 48*  Left rotation 52*   (Blank rows = not tested)   UPPER EXTREMITY MMT:  MMT Right eval Left eval  Shoulder flexion 4 4  Shoulder extension 4 4  Shoulder abduction 3+ 3+  Shoulder adduction 4 4  Shoulder internal rotation 4 4  Shoulder external rotation 3+ 3+  Middle trapezius    Lower trapezius    Elbow flexion 4 4  Elbow extension 4 4  Wrist flexion 4 4  Wrist extension 4 4  Wrist ulnar deviation    Wrist radial deviation    Wrist pronation    Wrist supination    Grip strength 19 PSI avg 16 psi avg   (Blank rows = not tested)  CERVICAL SPECIAL TESTS:  Upper limb tension test (ULTT): Negative and Spurling's test: Negative   Repeated Movements No centralization or peripheralization of symptoms with repeated cervical protraction and retraction.       TODAY'S TREATMENT:                                                                                                                              DATE:   THEREX:  Reviewed with patient and wife:  - Supine Chin Tuck  1 sets - 10 reps (VC for correct technique)  -importance of keeping LE elevated to reduce swelling -Scapular retraction with  BTB x 15 reps (patient denied any cervical pain or numbness into hands. -Wall posture stretch- hold 60 sec x 1- added to HEP verbally with patient  Reassessed Grip strength (left= avg= 21 psi; Right = 18psi) *pain noted on right hand with squeezing.  Reassessed all goals in preparation for discharge from [place]. See goals for details.   PATIENT EDUCATION:  Education details: PT plan of care; Educated in signs and symptoms of radiculopathy; anatomy of C-spine  Person educated: Patient Education method: Customer service manager Education  comprehension: verbalized understanding, returned demonstration, and needs further education  HOME EXERCISE PROGRAM:  Access Code: [email or web address] URL: [email or web address] Date: 03/18/2022 Prepared by: Sande Brothers  Exercises - Supine Chin Tuck  - 1 x daily - 7 x weekly - 3 sets - 10 reps - Chin Tuck  - 1 x daily - 7 x weekly - 3 sets - 10 reps - Seated Upper Trapezius Stretch  - 1 x daily - 7 x weekly - 3 sets - 30 hold - Seated Shoulder Rolls  - 1 x daily - 7 x weekly - 3 sets - 10 reps ASSESSMENT:  CLINICAL IMPRESSION: Patient present today for reassessment as he continues to feel swelling is coming from Prednisone and states overall decreased numbness and tingling into B Hands. He presents with non- compliant with HEP consisting of cervical stretching and postural strengthening. He does present with much improved cervical mobility - some improvement and decline with overall grip strength. Overall less pain and no worsening of symptoms. Patients swelling is still present but overall function is preserved. Dicussed importance of elevating LE to prevent increased swelling. Overall patient has functional lmprovement despite slight decline in subjective Foto score. Patient subjectively reported doing well at home without compromise. Patient is appropriate with majority of goals met and no further skilled PT services warranted at this time.    OBJECTIVE IMPAIRMENTS: decreased activity tolerance, decreased mobility, decreased ROM, decreased strength, hypomobility, impaired sensation, postural dysfunction, and pain.   ACTIVITY LIMITATIONS: carrying, lifting, and reach over head  PARTICIPATION LIMITATIONS: driving, community activity, and yard work  PERSONAL FACTORS: 1-2 comorbidities: HTN, arthritis  are also affecting patient's functional outcome.   REHAB POTENTIAL: Good  CLINICAL DECISION MAKING: Stable/uncomplicated  EVALUATION COMPLEXITY: Low   GOALS: Goals reviewed  with patient? Yes  SHORT TERM GOALS: Target date: 05/25/2022   Patient will be independent with HEP in order to improve strength and decrease back pain in order to improve pain-free function at home and work.   Baseline: Eval- Patient has no formal knowledge of exercises to improve his condiiton. 04/13/2022- Patient admitted non-compliance with HEP - Review again today with patient and wife and both verbalized understanding.  Goal status: GOAL NOT MET    LONG TERM GOALS: Target date: 07/06/2022  Pt will improve FOTO to target score of 83 to display perceived improvements in ability to complete ADL's.  Baseline: Eval= 81; 04/13/2022= 77 Goal status: GOAL NOT MET  2.  Patient will present with 50% improved cervical Rotation and lateral cervical flexion for improved neck ROM with driving.  Baseline: Eval= c-spine rotation L/R=52/48 deg and SB= 28/23 deg; 04/13/2022= c-spine rotation L/R=65/55 deg and SB= 38/30 deg Goal status: GOAL MET  3.  Patient will deny and numbness or swelling into B Hands with driving for improved ability to drive community and Leisurely distances.  Baseline: Eval= Patient driving from [place] to [place] with increased pain/numbness/swelling into B hands; 04/13/22= Patient reports no tingling  just occasional pain in Right hand   Goal status: GOAL MET  4.  Pt will decrease worst B hand pain as reported on NPRS by at least 2 points in order to demonstrate clinically significant reduction in back pain.  Baseline: 5/10 B hand pain; 04/13/2022= 3/10 Right hand pain Goal status: GOAL MET     PLAN:  PT FREQUENCY: 1-2x/week  PT DURATION: 12 weeks  PLANNED INTERVENTIONS: Therapeutic exercises, Therapeutic activity, Neuromuscular re-education, Patient/Family education, Self Care, Joint mobilization, Joint manipulation, Dry Needling, Electrical stimulation, Spinal manipulation, Spinal mobilization, Cryotherapy, Moist heat, Traction, and Manual therapy  PLAN FOR NEXT  SESSION: Continue with postural training; manual therapy for ROM - c-spine. Initiate HEP next visit.   Lewis Moccasin, PT 04/13/2022, 3:30 PM

## 2022-04-14 ENCOUNTER — Encounter: Payer: Self-pay | Admitting: Cardiovascular Disease

## 2022-04-14 ENCOUNTER — Ambulatory Visit: Payer: Medicare PPO | Attending: Cardiovascular Disease | Admitting: Cardiovascular Disease

## 2022-04-14 ENCOUNTER — Other Ambulatory Visit: Payer: Self-pay | Admitting: Cardiovascular Disease

## 2022-04-14 VITALS — BP 136/78 | HR 76 | Ht 68.0 in | Wt 191.8 lb

## 2022-04-14 DIAGNOSIS — E785 Hyperlipidemia, unspecified: Secondary | ICD-10-CM

## 2022-04-14 DIAGNOSIS — Z950 Presence of cardiac pacemaker: Secondary | ICD-10-CM

## 2022-04-14 DIAGNOSIS — G4733 Obstructive sleep apnea (adult) (pediatric): Secondary | ICD-10-CM | POA: Diagnosis not present

## 2022-04-14 DIAGNOSIS — Z8673 Personal history of transient ischemic attack (TIA), and cerebral infarction without residual deficits: Secondary | ICD-10-CM | POA: Diagnosis not present

## 2022-04-14 DIAGNOSIS — I5032 Chronic diastolic (congestive) heart failure: Secondary | ICD-10-CM | POA: Diagnosis not present

## 2022-04-14 DIAGNOSIS — R600 Localized edema: Secondary | ICD-10-CM

## 2022-04-14 DIAGNOSIS — Z952 Presence of prosthetic heart valve: Secondary | ICD-10-CM | POA: Diagnosis not present

## 2022-04-14 DIAGNOSIS — I4811 Longstanding persistent atrial fibrillation: Secondary | ICD-10-CM

## 2022-04-14 MED ORDER — TORSEMIDE 40 MG PO TABS: ORAL_TABLET | ORAL | 3 refills | Status: DC

## 2022-04-14 NOTE — Patient Instructions (Addendum)
Medication Instructions:  Stop Furosemide Start Torsemide 40 mg every morning Continue all other medications *If you need a refill on your cardiac medications before your next appointment, please call your pharmacy*   Lab Work: Have cmet,cbc,magnesium next week  Lab order enclosed   Testing/Procedures: None ordered    Follow-Up: At [facility], you and your health needs are our priority.  As part of our continuing mission to provide you with exceptional heart care, we have created designated Provider Care Teams.  These Care Teams include your primary Cardiologist (physician) and Advanced Practice Providers (APPs -  Physician Assistants and Nurse Practitioners) who all work together to provide you with the care you need, when you need it.  We recommend signing up for the patient portal called "MyChart".  Sign up information is provided on this After Visit Summary.  MyChart is used to connect with patients for Virtual Visits (Telemedicine).  Patients are able to view lab/test results, encounter notes, upcoming appointments, etc.  Non-urgent messages can be sent to your provider as well.   To learn more about what you can do with MyChart, go to [email or web address].    Your next appointment:  6 to 8 weeks    The format for your next appointment: Office    Provider:  [facility]  Appointment with  Important Information About [place]

## 2022-04-14 NOTE — Progress Notes (Signed)
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 reflux disease)    High cholesterol    History of lower GI bleeding    a. 09/2014 GIB due to diverticulosis/diverticulitis.   Iron deficiency anemia    Labile Hypertension    Moderate aortic stenosis    a. 10/2011 Echo:  EF >55%, mild-mod TR, mild-mod AS, mod Ca2+ of AoV leaflets; b. 07/2016 Echo: EF 60-65%, no rwma, Gr1 DD, mod AS [(S) mean grad 2179m, peak grad 79m44m Valve area (VTI): 1.33cm^2, (Vmax) 1.44cm^2. Mild MR]; c. 02/2018 Echo: EF 55-60%, no rwma, GR1 DD, Mod AS [Peak Vel (S): 319cm/s, Mean grad (S) 24mm24mpeak grad (S) 41mmH103m  PAF (paroxysmal atrial fibrillation) (HCC)    a. CHA2DS2VASc = 4-->Eliquis.   S/P TAVR (transcatheter aortic valve replacement) 11/14/2019   s/p TAVR with a 26 mm Edwards S3U via the TF approach by Drs McAlhaAngelena Formtle   Sleep apnea     Past Surgical History:  Procedure Laterality Date   CARDIAC CATHETERIZATION  02/27/2004   Coronary intervention and medical management   CARDIAC CATHETERIZATION  11/24/2016   CARDIOVERSION N/A 12/20/2019   Procedure: CARDIOVERSION;  Surgeon: RandolSkeet Latch [facility];  Laterality: N/A;   CARDIOVERSION N/A 02/05/2020   Procedure: CARDIOVERSION;  Surgeon: NishanJosue Hector [facility];  Service: Cardiovascular;  Laterality: N/A;   CATARACT EXTRACTION W/ INTRAOCULAR LENS IMPLANT Left    CORONARY ANGIOPLASTY  1994 X 2    "before bypass surgery"   CORONARY ANGIOPLASTY WITH STENT PLACEMENT  03/04/2004   SVG supplying the diagonal vessel stented with a 3.5x16mm T43m stent post dilated to 4.0 mm   CORONARY ARTERY BYPASS GRAFT  1994   "CABG X4"   INGUINAL HERNIA REPAIR Right    PACEMAKER IMPLANT N/A 11/20/2019   Procedure: PACEMAKER IMPLANT;  Surgeon: Klein, Deboraha SprangLocation: [facility];  Service: Cardiovascular;  Laterality: N/A;   RIGHT HEART CATH AND CORONARY/GRAFT ANGIOGRAPHY N/A 11/24/2016   Procedure: Right Heart Cath and Coronary/Graft Angiography;  Surgeon: Saxon Crosby, Troy SineLocation: [facility];  Service: Cardiovascular;  Laterality: N/A;   RIGHT/LEFT HEART CATH AND CORONARY/GRAFT ANGIOGRAPHY N/A 10/18/2019   Procedure: RIGHT/LEFT HEART CATH AND CORONARY/GRAFT ANGIOGRAPHY;  Surgeon: Daliah Chaudoin, Troy SineLocation[facility];  Service: Cardiovascular;  Laterality: N/A;  TEE WITHOUT CARDIOVERSION N/A 11/14/2019   Procedure: TRANSESOPHAGEAL ECHOCARDIOGRAM (TEE);  Surgeon: Burnell Blanks, MD;  Location: [place] [facility];  Service: Open Heart Surgery;  Laterality: N/A;   TONSILLECTOMY AND ADENOIDECTOMY     TRANSCATHETER AORTIC VALVE REPLACEMENT, TRANSFEMORAL N/A 11/14/2019   Procedure: TRANSCATHETER AORTIC VALVE REPLACEMENT, TRANSFEMORAL;  Surgeon: Burnell Blanks, MD;  Location: [place] [facility];  Service: Open Heart Surgery;  Laterality: N/A;    Allergies  Allergen Reactions   Ramipril Swelling and Other (See Comments)    Mouth swelling Other reaction(s): Tongue swelling?   Guaifenesin Er Hives, Swelling and Other (See Comments)    Mouth swelling Other reaction(s): swelling of tongue   Azithromycin     Other reaction(s): Does not work Other reaction(s): Does not work   Iodinated [employer] Other (See Comments)    Made eyes change each time Other reaction(s): Unknown    Current Outpatient Medications  Medication Sig Dispense Refill    albuterol (VENTOLIN HFA) 108 (90 Base) MCG/ACT inhaler USE 2 INHALATIONS BY MOUTH EVERY 4 - 6 HOURS AS NEEDED 18 g 0   amLODipine (NORVASC) 10 MG tablet Take 1 tablet (10 mg total) by mouth daily. 90 tablet 3   apixaban (ELIQUIS) 2.5 MG TABS tablet Take 1 tablet (2.5 mg total) by mouth 2 (two) times daily. 180 tablet 3   atorvastatin (LIPITOR) 20 MG tablet Take 1 tablet (20 mg total) by mouth at bedtime. 30 tablet 0   carvedilol (COREG) 3.125 MG tablet TAKE 1 TABLET BY MOUTH TWICE A DAY WITH A MEAL 180 tablet 2   DYMISTA 137-50 MCG/ACT SUSP Place 1-2 sprays into both nostrils daily as needed.     ezetimibe (ZETIA) 10 MG tablet TAKE 1 TABLET BY MOUTH EVERY DAY 90 tablet 3   fenofibrate (TRICOR) 145 MG tablet TAKE 1/2 TABLETS BY MOUTH DAILY 45 tablet 8   finasteride (PROSCAR) 5 MG tablet Take 5 mg by mouth daily.     fluticasone (FLONASE) 50 MCG/ACT nasal spray Place 2 sprays into both nostrils daily as needed for allergies or rhinitis.      ipratropium (ATROVENT) 0.03 % nasal spray Place 2 sprays into both nostrils 2 (two) times daily. 30 mL 11   isosorbide mononitrate (IMDUR) 30 MG 24 hr tablet Take 1 tablet (30 mg total) by mouth daily. 90 tablet 3   levothyroxine (SYNTHROID) 25 MCG tablet Take 25 mcg by mouth daily before breakfast.      loratadine (CLARITIN) 10 MG tablet Take 1 tablet (10 mg total) by mouth 2 (two) times daily as needed for allergies (Can use an extra dose during flares). 60 tablet 11   Multiple Vitamins-Minerals (CENTRUM SILVER 50+MEN) TABS Take 1 tablet by mouth at bedtime.     nitroGLYCERIN (NITROLINGUAL) 0.4 MG/SPRAY spray PLACE 1 SPRAY UNDER THE TONGUE EVERY 5 (FIVE) MINUTES X 3 DOSES AS NEEDED FOR CHEST PAIN. 12 g 1   Omega-3 Fatty Acids (FISH OIL) 1000 MG CAPS Take 1,000 mg by mouth 2 (two) times daily.      pantoprazole (PROTONIX) 40 MG tablet TAKE 1 TABLET BY MOUTH EVERY DAY 90 tablet 1   potassium chloride SA (KLOR-CON M) 20 MEQ tablet Take 1 tablet (20 mEq total) by  mouth daily. 90 tablet 3   ranolazine (RANEXA) 500 MG 12 hr tablet TAKE 1 TABLET BY MOUTH TWICE A DAY 180 tablet 2   vitamin C (ASCORBIC ACID) 500 MG tablet Take 500 mg by mouth at  bedtime.      vitamin E 400 UNIT capsule Take 400 Units by mouth daily.     zinc gluconate 50 MG tablet Take 50 mg by mouth daily.     torsemide (DEMADEX) 20 MG tablet TAKE 2 TABLETS BY MOUTH EVERY DAY 180 tablet 0   No current facility-administered medications for this visit.    Socially he is married and has 2 children and 4 grandchildren. There is no tobacco alcohol use. Recently he was has been very active.  ROS General: Negative; No fevers, chills, or night sweats;  HEENT: He is hard of hearing; A new complaint is that of ringing in his ears; right eye diplopia; no sinus congestion, difficulty swallowing Pulmonary: Negative; No cough, wheezing, shortness of breath, hemoptysis Cardiovascular: See HPI GI: Positive for recent GI bleed secondary to diverticular disease GU: Negative; No dysuria, hematuria, or difficulty voiding Musculoskeletal: Negative; no myalgias, joint pain, or weakness Hematologic/Oncology: Negative; no easy bruising, bleeding Endocrine: Negative; no heat/cold intolerance; no diabetes Neuro: Visual changes following recent Skin: Negative; No rashes or skin lesions Psychiatric: Negative; No behavioral problems, depression Sleep: Positive for OSA with history of snoring, daytime sleepiness, no bruxism, restless legs, hypnogognic hallucinations, no cataplexy Other comprehensive 14 point system review is negative.  PE BP 136/78   Pulse 76   Ht 5' 8" (1.727 m)   Wt 191 lb 12.8 oz (87 kg)   SpO2 97%   BMI 29.16 kg/m    Repeat blood pressure by me was 132/68  Wt Readings from Last 3 Encounters:  04/14/22 191 lb 12.8 oz (87 kg)  12/03/21 186 lb (84.4 kg)  10/01/21 184 lb 6.4 oz (83.6 kg)   General: Alert, oriented, no distress.  Skin: normal turgor, no rashes, warm and dry HEENT:  Normocephalic, atraumatic.  right eye diplopia with double vision improved with prism glasses Nose without nasal septal hypertrophy Mouth/Parynx benign; Mallinpatti scale 3 Neck: No JVD, no carotid bruits; normal carotid upstroke Lungs: clear to ausculatation and percussion; no wheezing or rales Chest wall: without tenderness to palpitation Heart: PMI not displaced, RRR, s1 s2 normal, 1/6 systolic murmur, no diastolic murmur, no rubs, gallops, thrills, or heaves Abdomen: soft, nontender; no hepatosplenomehaly, BS+; abdominal aorta nontender and not dilated by palpation. Back: no CVA tenderness Pulses 2+ Musculoskeletal: full range of motion, normal strength, no joint deformities Extremities: 3+ left lower extremity and 2+ right lower extremity edema. No clubbing cyanosis Homan's sign negative  Neurologic: grossly nonfocal; Cranial nerves grossly wnl Psychologic: Normal mood and affect  April 14, 2022 ECG (independently read by me): V paced rhythm at 76   December 03, 2021 ECG (independently read by me): V paced rhythm   July 21, 2021 ECG (independently read by me): Sinus rhythm with frequent V pacing  March 19, 2021 ECG (independently read by me): V paced at 55. Underlying AF   November 05, 2020 ECG (independently read by me):  Atrial fibrillation at 76, LVH with QRS widening, LAD;  March 3,2022 ECG (independently read by me): Atrial fibrillation at 64, QTc 480 msec  ECG (independently read by me): Atrial fibrillation at 62 with a competing junctional pacemaker, Left axis deviation, QTc 460 msec  March 2020 ECG (independently read by me): Sinus rhythm at 71 bpm.  First-degree block with 212 ms.  Right bundle branch block with repolarization changes.  Left anterior hemiblock.  January 26, 2018 ECG (independently read by me): Sinus bradycardia at 47 bpm with first-degree AV block.  PR  interval 218 ms.  LVH with QRS widening.  Left axis deviation.  October 28, 2017 ECG (independently read  by me): Atrial fibrillation with variable rate that seems to be more in the 50s to 60s but following a pause drops into the 40s  August 26, 2017 ECG (independently read by me): atrial fibrillation at 56 bpm.  Nonspecific ST changes.  QTc interval 476 ms.  February 2019 ECG (independently read by me): Atrial fibrillation at 71 bpm.  LVH by voltage in aVL.  Nonspecific T changes.  04/27/2017 ECG (independently read by me): Normal sinus rhythm at 60 bpm.  Nonspecific intraventricular block.  Anterior T-wave abnormality  03/23/2017 ECG (independently read by me): atrial fibrillation at 64 bpm.  Nonspecific interventricular block.  Nonspecific T wave abnormality.  02/17/2017 ECG (independently read by me): Atrial fibrillation at 72 bpm.  RV conduction delay.  QTc interval 453 ms.  01/29/2017 ECG (independently read by me): Atrial fibrillation at 56 bpm.  LVH by voltage criteria.  Nonspecific ST changes.  QTc interval 430 ms.  August 2018 ECG (independently read by me): Sinus bradycardia 59 bpm.  LVH by voltage.  QTc interval 469 ms.  No significant ST-T changes.  April 2018 ECG (independently read by me): Sinus rhythm at 61 bpm.  RV conduction delay.  LVH.  March 2018 ECG (independently read by me): Atrial fibrillation at 71 bpm.  RV conduction delay.  QTc interval 456 ms.  Left axis deviation.  February 2018 ECG (independently read by me): Normal sinus rhythm at 60 bpm.  LVH.  QTc interval at 464 ms.  Nonspecific T abnormality  December 2017 ECG (independently read by me):  Sinus bradycardia 57 bpm.  LVH by voltage. No significant ST changes.  January 10, 2016 ECG (independently read by me): Normal sinus rhythm at 64 bpm.  LVH.  QTc interval 468 ms.  December 2016 ECG (independently read by me):  Sinus bradycardia 51 bpm.  No ectopy. QTc interval 429 ms.  LVH by voltage criteria in aVL.  September 2016 ECG (independently read by me):  Sinus bradycardia with first-degree AV block. RV  conduction delay. No significant ST-T changes.   August 2016 ECG (independently read by me): Sinus bradycardia 57 bpm.  Mild RV conduction delay.  May 2016 ECG (independently read by me): Sinus bradycardia at 49 bpm with first-degree AV block with a PR interval 218 ms.  Right reticular conduction delay.  December 2015 ECG (independently read by me).  For these: Sinus bradycardia 57 bpm.  First-degree AV block with a PR interval at 224 ms.  No significant ST-T changes.  March 2015 ECG (independently read by me): Sinus rhythm at 59 beats per minute. Mild LVH by voltage criteria in aVL. Normal intervals.  01/05/2013 ECG: Normal sinus rhythm at 62. Mild RV conduction delay. PR interval 204 ms, QTc interval 452 ms.  LABS:    Latest Ref Rng & Units 04/15/2022    1:26 PM 12/03/2021    3:33 PM 10/13/2021    1:20 PM  BMP  Glucose 70 - 99 mg/dL 92  84  113   BUN 8 - 23 mg/dL _0 Creatinine 0.61 - 1.24 mg/dL 1.28  1.63  1.44   BUN/Creat Ratio 10 - 24  17    Sodium 135 - 145 mmol/L 139  144  140   Potassium 3.5 - 5.1 mmol/L 4.1  4.4  4.3   Chloride 98 - 111 mmol/L 110  108  117   CO2 22 - 32 mmol/L _0 Calcium 8.9 - 10.3 mg/dL 9.1  9.8  8.9       Latest Ref Rng & Units 04/15/2022    1:26 PM 06/28/2021    5:23 PM 11/07/2020   12:00 AM  Hepatic Function  Total Protein 6.5 - 8.1 g/dL 7.0  6.5  6.5   Albumin 3.5 - 5.0 g/dL 3.1  3.5  4.2   AST 15 - 41 U/L _1 ALT 0 - 44 U/L _2 Alk Phosphatase 38 - 126 U/L 43  43  62   Total Bilirubin 0.3 - 1.2 mg/dL 0.6  0.6  0.4       Latest Ref Rng & Units 04/15/2022    1:26 PM 12/03/2021    3:33 PM 07/11/2021    9:27 AM  CBC  WBC 4.0 - 10.5 K/uL 7.9  6.6  6.5   Hemoglobin 13.0 - 17.0 g/dL 9.9  12.4  13.7   Hematocrit 39.0 - 52.0 % 31.6  37.3  42.0   Platelets 150 - 400 K/uL 263  177  146    Lab Results  Component Value Date   MCV 90.5 04/15/2022   MCV 94 12/03/2021   MCV 92 07/11/2021   Lab Results  Component  Value Date   TSH 1.671 06/29/2021   Lab Results  Component Value Date   HGBA1C 5.5 06/28/2021     Lipid Panel     Component Value Date/Time   CHOL 108 07/11/2021 0927   CHOL 137 04/09/2014 1115   TRIG 123 07/11/2021 0927   TRIG 182 (H) 04/09/2014 1115   HDL 38 (L) 07/11/2021 0927   HDL 32 (L) 04/09/2014 1115   CHOLHDL 2.8 07/11/2021 0927   CHOLHDL 3.2 06/28/2021 2349   VLDL 14 06/28/2021 2349   LDLCALC 48 07/11/2021 0927   LDLCALC 69 04/09/2014 1115     RADIOLOGY: No results found.  A CPAP download was obtained in the office with reference to his obstructive sleep apnea from August 18 through January 24, 2018. He is 100% compliant with usage days and averaging 6 hours and 35 minutes of usage daily.  At his 12 cm pressure, AHI is excellent at 0.9.  He does have a mask leak.  CPAP November 10 through April 18, 2018: Compliance 97% usage days and usage greater than 4 hours.  Average use 6 hours 46 minutes.  At 12 cm pressure, AHI 1.2/h.  CPAP download June 25, 2018 through July 24, 2018.  Usage 87%, usage greater than 4 hours 83%, average usage on days used 6 hours and 34 minutes.  AHI 1.2 8012 cm pressure.  A download was obtained in the office from May 2022 through November 04, 2020.  Usage days only 63%.  Average usage is 6 hours 22 minutes.  AHI 5.0 with a auto minimum pressure 11 maximum pressure 16 with 95 percentile pressure 11.5.  There is mask leak.  A download from February 12 through July 21, 2021 showed 50% use although the patient was hospitalized during some of this time..  Average use 5 hours and 50 minutes.  There is significant mask leak contributing to increased AHI at 9.7.  IMPRESSION:  1. Longstanding persistent atrial fibrillation ([place])   2. Bilateral lower extremity edema   3. Chronic diastolic CHF (congestive heart failure) ([place])   4. OSA (obstructive sleep apnea)  5. S/P TAVR (transcatheter aortic valve replacement)   6. Hyperlipidemia LDL goal  <70   7. Pacemaker   8. History of CVA (cerebrovascular accident)    ASSESSMENT AND PLAN: Mr. Dorsey is an 86 year-old  male who is status post CABG revascularization surgery in 1994.  Subsequently he underwent DES stenting to the graft supplying the diagonal vessel in 2003.  In July 2017 he was hospitalized with atrial fibrillation in the setting of bradycardia and hypotension leading to medication adjustment  An echo Doppler study of 11/21/2015 showed an EF of 55-60%, mild aortic stenosis with a valve area of 1.75 mm per there was mild pulmonary hypertension. Subsequently he had complained of experiencing some episodes of chest tightness and his symptoms improved with further titration of nitrates as well as beta blocker therapy.  He developed significant anemia, which undoubtedly exacerbated his anginal symptomatology.  At catheterization in July 2018 although his LIMA to mid LAD and vein graft to diagonal vessel are patent the very proximal LAD had a 95% stenosis which supplies a diagonal vessel, which was not supplied by the grafts.  His native RCA and graft to RCA was occluded and there is some collateralization to the distal RCA via the vein graft supplying the circumflex marginal vessel.  His anginal symptoms improved with stabilization of his hemoglobin.  Spironolactone was discontinued secondary to renal insufficiency.  Due to progressive aortic stenosis he underwent TAVR with 26 mm SAPIEN 3 valve on November 14, 2019 and due to subsequent prolonged pauses with syncope underwent dual chamber Medtronic permanent pacemaker by Dr. Caryl Comes on November 20, 2019.  He has been on Eliquis therapy for persistent long-term atrial fibrillation.  He has had issues with lower extremity edema, and was hospitalized with suspected CVA in the setting of missed doses of Eliquis in February 2023.  He subsequently developed diplopia of his right eye and is now wearing prism glasses.  He has developed increasing creatinine greater  than 1.5 and his Eliquis dose has been reduced to 2.5 mg twice a day.  Presently, he is without chest pain or anginal type symptoms.  He was recently treated with acute respiratory illness for which she received Augmentin Mucinex and Delsym.  His ECG shows V paced rhythm with underlying AF.  Blood pressure today is stable on his regimen of amlodipine 10 mg, carvedilol 3.125 mg twice a day, furosemide 40 mg and isosorbide 30 mg daily.  He continues to be on Ranexa 500 mg twice a day without anginal symptoms.  With these 2-3+ bilateral lower extremity edema I am decreasing amlodipine to 5 mg.  I will change his furosemide to torsemide 40 mg and have recommended he wear 20 to 30 mm support stockings.  He continues to be on Zetia 10 mg and atorvastatin 20 mg for hyperlipidemia.  Laboratory in March 2023 showed an LDL cholesterol at 48 with total cholesterol 108 and triglycerides 123.  I am recommending follow-up comprehensive metabolic panel, magnesium and CBC.  Since his last visit, his CPAP use has been more consistent and is mostly send download was reviewed with usage at 90% of the days and average use at 6 hours per night.  He has significant mask leak which may be due to some cleansing issues per his DME company.  I will see him in 6 to 8 weeks for follow-up evaluation. Troy Sine, MD, [facility]  04/25/2022 3:30 PM

## 2022-04-15 ENCOUNTER — Ambulatory Visit: Payer: Medicare PPO

## 2022-04-15 ENCOUNTER — Other Ambulatory Visit
Admission: RE | Admit: 2022-04-15 | Discharge: 2022-04-15 | Disposition: A | Discharge status: Home / Self Care | Payer: Medicare PPO | Attending: Cardiovascular Disease | Admitting: Cardiovascular Disease

## 2022-04-15 DIAGNOSIS — I5032 Chronic diastolic (congestive) heart failure: Secondary | ICD-10-CM | POA: Diagnosis not present

## 2022-04-15 DIAGNOSIS — I4811 Longstanding persistent atrial fibrillation: Secondary | ICD-10-CM | POA: Insufficient documentation

## 2022-04-15 LAB — COMPREHENSIVE METABOLIC PANEL
ALT: 30 U/L (ref 0–44)
AST: 30 U/L (ref 15–41)
Albumin: 3.1 g/dL — ABNORMAL LOW (ref 3.5–5.0)
Alkaline Phosphatase: 43 U/L (ref 38–126)
Anion gap: 8 (ref 5–15)
BUN: 18 mg/dL (ref 8–23)
CO2: 21 mmol/L — ABNORMAL LOW (ref 22–32)
Calcium: 9.1 mg/dL (ref 8.9–10.3)
Chloride: 110 mmol/L (ref 98–111)
Creatinine, Ser: 1.28 mg/dL — ABNORMAL HIGH (ref 0.61–1.24)
GFR, Estimated: 55 mL/min — ABNORMAL LOW (ref >60)
Glucose, Bld: 92 mg/dL (ref 70–99)
Potassium: 4.1 mmol/L (ref 3.5–5.1)
Sodium: 139 mmol/L (ref 135–145)
Total Bilirubin: 0.6 mg/dL (ref 0.3–1.2)
Total Protein: 7 g/dL (ref 6.5–8.1)

## 2022-04-15 LAB — CBC WITH DIFFERENTIAL/PLATELET
Abs Immature Granulocytes: 0.04 10*3/uL (ref 0.00–0.07)
Basophils Absolute: 0 10*3/uL (ref 0.0–0.1)
Basophils Relative: 0 %
Eosinophils Absolute: 0.2 10*3/uL (ref 0.0–0.5)
Eosinophils Relative: 2 %
HCT: 31.6 % — ABNORMAL LOW (ref 39.0–52.0)
Hemoglobin: 9.9 g/dL — ABNORMAL LOW (ref 13.0–17.0)
Immature Granulocytes: 1 %
Lymphocytes Relative: 13 %
Lymphs Abs: 1 10*3/uL (ref 0.7–4.0)
MCH: 28.4 pg (ref 26.0–34.0)
MCHC: 31.3 g/dL (ref 30.0–36.0)
MCV: 90.5 fL (ref 80.0–100.0)
Monocytes Absolute: 1.2 10*3/uL — ABNORMAL HIGH (ref 0.1–1.0)
Monocytes Relative: 15 %
Neutro Abs: 5.5 10*3/uL (ref 1.7–7.7)
Neutrophils Relative %: 69 %
Platelets: 263 10*3/uL (ref 150–400)
RBC: 3.49 MIL/uL — ABNORMAL LOW (ref 4.22–5.81)
RDW: 13.6 % (ref 11.5–15.5)
WBC: 7.9 10*3/uL (ref 4.0–10.5)
nRBC: 0 % (ref 0.0–0.2)

## 2022-04-15 LAB — MAGNESIUM: Magnesium: 1.9 mg/dL (ref 1.7–2.4)

## 2022-04-20 ENCOUNTER — Ambulatory Visit: Payer: Medicare PPO

## 2022-04-22 ENCOUNTER — Ambulatory Visit: Payer: Medicare PPO

## 2022-04-25 ENCOUNTER — Encounter: Payer: Self-pay | Admitting: Cardiovascular Disease

## 2022-04-27 ENCOUNTER — Ambulatory Visit: Payer: Medicare PPO | Admitting: Physical Therapy

## 2022-04-29 ENCOUNTER — Ambulatory Visit: Payer: Medicare PPO

## 2022-04-29 DIAGNOSIS — L570 Actinic keratosis: Secondary | ICD-10-CM | POA: Diagnosis not present

## 2022-04-29 DIAGNOSIS — L814 Other melanin hyperpigmentation: Secondary | ICD-10-CM | POA: Diagnosis not present

## 2022-04-29 DIAGNOSIS — L821 Other seborrheic keratosis: Secondary | ICD-10-CM | POA: Diagnosis not present

## 2022-05-19 NOTE — Progress Notes (Unsigned)
Cardiology Clinic Note   Patient Name: Jorge Cherry Date of Encounter: 05/19/2022  Primary Care Provider:  Prince Solian, MD Primary Cardiologist:  Shelva Majestic, MD  Patient Profile    Jorge Cherry is a 87 y.o. male with a past medical history of CAD s/p CABG x 4 1994 and stent proximal SVG to diagonal 2005, PAF on anticoagulation, coronary artery stenosis, chronic diastolic heart failure, junctional bradycardia/complete heart block s/p PPM 2021, severe AS s/p TAVR 2021, hypertension, hyperlipidemia, CKD stage III who presents to the clinic today for 4 week follow-up.   Past Medical History    Past Medical History:  Diagnosis Date   Arthritis    "just a touch in my hands" (11/24/2016)   Asthma    Atherosclerosis of renal artery ([place])    RENAL DOPPLER, 12/10/2011 - Left renal artery demonstrated narrowing with elevated velocities consistent with a 1-59% diameter reduction   CKD (chronic kidney disease) stage 3, GFR 30-59 ml/min (HCC)    "stable now since they backed off the water pills" (11/24/2016)   Coronary artery disease    a. 1994 s/p CABG x 4 (LIMA-LAD, VG->D2, VG->OM, VG->RCA); b. 02/2004 PCI SVG-D2 (3.5x16 Taxus DES). VG->RCA 100. Sev apical LAD dzs distal to LIMA insertion; c. 11/2016 Cath: LAD 95p/141m, D2 100ost, LCX 100ost, 70p/m, RCA 100p/m, RPDA fills via L->R collats. VG->RCA 100, VG->D2 20 ost, patent prox stent, 60m, LIMA->LAD ok, VG->OM3 20p. EF 55%-->Med Rx.   GERD (gastroesophageal reflux disease)    High cholesterol    History of lower GI bleeding    a. 09/2014 GIB due to diverticulosis/diverticulitis.   Iron deficiency anemia    Labile Hypertension    Moderate aortic stenosis    a. 10/2011 Echo:  EF >55%, mild-mod TR, mild-mod AS, mod Ca2+ of AoV leaflets; b. 07/2016 Echo: EF 60-65%, no rwma, Gr1 DD, mod AS [(S) mean grad 44mmHg, peak grad 43mmHg. Valve area (VTI): 1.33cm^2, (Vmax) 1.44cm^2. Mild MR]; c. 02/2018 Echo: EF 55-60%, no rwma, GR1 DD, Mod AS [Peak Vel (S):  319cm/s, Mean grad (S) 32mmHg, peak grad (S) 7mmHg].   PAF (paroxysmal atrial fibrillation) (HCC)    a. CHA2DS2VASc = 4-->Eliquis.   S/P TAVR (transcatheter aortic valve replacement) 11/14/2019   s/p TAVR with a 26 mm Edwards S3U via the TF approach by Drs Angelena Form & Bartle   Sleep apnea    Past Surgical History:  Procedure Laterality Date   CARDIAC CATHETERIZATION  02/27/2004   Coronary intervention and medical management   CARDIAC CATHETERIZATION  11/24/2016   CARDIOVERSION N/A 12/20/2019   Procedure: CARDIOVERSION;  Surgeon: Skeet Latch, MD;  Location: [facility];  Service: Cardiovascular;  Laterality: N/A;   CARDIOVERSION N/A 02/05/2020   Procedure: CARDIOVERSION;  Surgeon: Josue Hector, MD;  Location: [facility] ENDOSCOPY;  Service: Cardiovascular;  Laterality: N/A;   CATARACT EXTRACTION W/ INTRAOCULAR LENS IMPLANT Left    CORONARY ANGIOPLASTY  1994 X 2   "before bypass surgery"   CORONARY ANGIOPLASTY WITH STENT PLACEMENT  03/04/2004   SVG supplying the diagonal vessel stented with a 3.5x90mm Taxus stent post dilated to 4.0 mm   CORONARY ARTERY BYPASS GRAFT  1994   "CABG X4"   INGUINAL HERNIA REPAIR Right    PACEMAKER IMPLANT N/A 11/20/2019   Procedure: PACEMAKER IMPLANT;  Surgeon: Deboraha Sprang, MD;  Location: [place] CV LAB;  Service: Cardiovascular;  Laterality: N/A;   RIGHT HEART CATH AND CORONARY/GRAFT ANGIOGRAPHY N/A 11/24/2016   Procedure: Right Heart  Cath and Coronary/Graft Angiography;  Surgeon: Lennette Bihari, MD;  Location: [facility] INVASIVE CV LAB;  Service: Cardiovascular;  Laterality: N/A;   RIGHT/LEFT HEART CATH AND CORONARY/GRAFT ANGIOGRAPHY N/A 10/18/2019   Procedure: RIGHT/LEFT HEART CATH AND CORONARY/GRAFT ANGIOGRAPHY;  Surgeon: Lennette Bihari, MD;  Location: [facility] INVASIVE CV LAB;  Service: Cardiovascular;  Laterality: N/A;   TEE WITHOUT CARDIOVERSION N/A 11/14/2019   Procedure: TRANSESOPHAGEAL ECHOCARDIOGRAM (TEE);  Surgeon: Kathleene Hazel, MD;  Location:  [facility] INVASIVE CV LAB;  Service: Open Heart Surgery;  Laterality: N/A;   TONSILLECTOMY AND ADENOIDECTOMY     TRANSCATHETER AORTIC VALVE REPLACEMENT, TRANSFEMORAL N/A 11/14/2019   Procedure: TRANSCATHETER AORTIC VALVE REPLACEMENT, TRANSFEMORAL;  Surgeon: Kathleene Hazel, MD;  Location: [facility] INVASIVE CV LAB;  Service: Open Heart Surgery;  Laterality: N/A;    Allergies  Allergies  Allergen Reactions   Ramipril Swelling and Other (See Comments)    Mouth swelling Other reaction(s): Tongue swelling?   Guaifenesin Er Hives, Swelling and Other (See Comments)    Mouth swelling Other reaction(s): swelling of tongue   Azithromycin     Other reaction(s): Does not work Other reaction(s): Does not work   Iodinated [employer] Other (See Comments)    Made eyes change each time Other reaction(s): Unknown    History of Present Illness    Harshith S Robotham has a past medical history of: CAD.  S/p CABG x 11 Sep 1992: LIMA to LAD, SVG to diagonal, SVG to OM, and SVG to RCA.  LHC 03/04/2004: PTCA/stenting SVG to diagonal.  LHC 11/24/2016: Intervention deferred secondary to anemia. Medical therapy intensified.  Preserved global LV function with focal mild mid anterolateral hypocontractility and an ejection fraction of 55%.  Supravalvular aortography demonstrating upper normal aortic root with mild aortic valve calcification with reduced moderately reduced excursion.  No angiographic aortic insufficiency.  Severe native CAD with 95% proximal LAD stenosis just prior to the first diagonal vessel with total occlusion of the LAD after the third septal perforating artery and before the takeoff of the second diagonal branch; total occlusion of the circumflex at the ostium; and total proximal RCA occlusion.  Patent LIMA graft which supplies the mid LAD, but due to the total occlusion of the LAD after the septal perforating artery, the proximal LAD and diagonal vessel is not supplied by the graft.  Patent vein  graft supplying the distal marginal vessel with filling retrograde of the circumflex up to the ostium with 70% mid AV groove stenosis.  There is collateral filling to the distal RCA.  Patent vein graft supplying the second diagonal vessel with 20% ostial narrowing, a patent proximal stent, and diffuse 40% mid graft stenosis. There is 30% narrowing after the graft anastomosis.  Occluded SVG which had supplied the distal RCA. PAF.  S/p cardioversion 12/20/2019.  Chronic diastolic heart failure.  Echo 06/29/2021: EF 50-55%. Moderate LVH. Mildly reduce RV systolic function. Mild to moderately dilated right atrium. Mild to moderate MR. Mild TR. Mild MR. Negative bubble study.  Junctional bradycardia/complete heart block. ZIO 11/15/2019 post TAVR: Underlying rhythm afib avg 55 bpm. Several prolonged pauses of 4, 5, 9.8, and 13 seconds.  Pacemaker implantation 11/20/2019: [employer] PPM.  Remote device check 02/19/2022: Normal function. Battery status good. Lead measurements unchanged.  Severe AS.   Echo 08/14/2019: EF 55-60%. Grade II DD. Mild MR. Moderate to severe AS.  R/LHC 10/18/2019: CAD similar to above. Continue medical management. Right heart pressures mildly elevated.  TAVR 7/6/201.  Carotid artery stenosis:  Carotid US 11/10/2019: 1-39% right ICA. 40-59% left ICA.  Hypertension.  Hyperlipidemia.  Lipid panel 07/11/2021: LDL 48, HDL 38, TG 123, total 108.  CKD, stage III.  OSA. On CPAP ***  Mr. Yohn is a long time patient of cardiology followed for the above outlined history. He was last seen in the office by Dr. Tresa Endo on 04/14/2022. At that time he complained of L>R lower extremity edema. He was changed from furosemide to torsemide and compression stockings were recommended.   Today, patient ***  CPAP? HGB?  CBC, BMET Edema?  CAD. S/p CABG x 4 1994 and SVG to diagonal stenting 2005. LHC 2021 shows multivessel obstructive disease with plan for continuation of medical management. Patient ***  Continue Eliquis, Lipitor, Zetia, Tricor, Imdur, ranexa, prn SL NTG, carvedilol. Not on aspirin therapy secondary to Eliquis.  Lower extremity edema/chronic diastolic heart failure. Echo February 2023 EF 50-55%, mildly reduced RV systolic function, mild MR, mild TR. Patient reports *** Severe AS s/p TAVR. Patient *** PAF. Patient *** Continue Eliquis. Appropriate Eliquis dose; patient does not meet age, weight, or kidney function criteria for dose adjustment.  Junctional bradycardia/complete heart block s/p PPM. Remote check October 2023 showed normal device function with good battery status. Patient denies syncope *** Hypertension. BP today *** Patient denies headaches or dizziness. Continue amlodipine and carvedilol. Hyperlipidemia. LDL March 2023 48, at goal. Continue Lipitor, Zetia, and tricor.     Home Medications    No outpatient medications have been marked as taking for the 05/20/22 encounter (Appointment) with Ronney Asters, NP.    Family History    Family History  Problem Relation Age of Onset   Cancer Mother    Heart disease Father        died age 52   Stroke Maternal Grandmother    Cancer Maternal Grandfather    Allergic rhinitis Neg Hx    Angioedema Neg Hx    Asthma Neg Hx    Atopy Neg Hx    Eczema Neg Hx    Immunodeficiency Neg Hx    Urticaria Neg Hx    He indicated that his mother is deceased. He indicated that his father is deceased. He indicated that his maternal grandmother is deceased. He indicated that his maternal grandfather is deceased. He indicated that the status of his neg hx is unknown.   Social History    Social History   Socioeconomic History   Marital status: Married    Spouse name: Not on file   Number of children: 2   Years of education: Not on file   Highest education level: Not on file  Occupational History   Not on file  Tobacco Use   Smoking status: Never   Smokeless tobacco: Never  Vaping Use   Vaping Use: Never used  Substance  and Sexual Activity   Alcohol use: No    Alcohol/week: 0.0 standard drinks of alcohol   Drug use: No   Sexual activity: Not Currently  Other Topics Concern   Not on file  Social History Narrative   Lives at home in [place] with wife.  Retired from [employer].     Social Determinants of Health   Financial Resource Strain: Not on file  Food Insecurity: Not on file  Transportation Needs: Not on file  Physical Activity: Not on file  Stress: Not on file  Social Connections: Not on file  Intimate Partner Violence: Not on file     Review of Systems  General: *** No chills, fever, night sweats or weight changes.  Cardiovascular:  No chest pain, dyspnea on exertion, edema, orthopnea, palpitations, paroxysmal nocturnal dyspnea. Dermatological: No rash, lesions/masses Respiratory: No cough, dyspnea Urologic: No hematuria, dysuria Abdominal:   No nausea, vomiting, diarrhea, bright red blood per rectum, melena, or hematemesis Neurologic:  No visual changes, weakness, changes in mental status. All other systems reviewed and are otherwise negative except as noted above.  Physical Exam    VS:  There were no vitals taken for this visit. , BMI There is no height or weight on file to calculate BMI. GEN: *** Well nourished, well developed, in no acute distress. HEENT: Normal. Neck: Supple, no JVD, carotid bruits, or masses. Cardiac: RRR, no murmurs, rubs, or gallops. No clubbing, cyanosis, edema.  Radials/DP/PT 2+ and equal bilaterally.  Respiratory:  Respirations regular and unlabored, clear to auscultation bilaterally. GI: Soft, nontender, nondistended. MS: No deformity or atrophy. Skin: Warm and dry, no rash. Neuro: Strength and sensation are intact. Psych: Normal affect.  Accessory Clinical Findings    The following studies were reviewed for this visit: ***  Recent Labs: 06/29/2021: TSH 1.671 04/15/2022: ALT 30; BUN 18; Creatinine, Ser 1.28; Hemoglobin 9.9;  Magnesium 1.9; Platelets 263; Potassium 4.1; Sodium 139   Recent Lipid Panel    Component Value Date/Time   CHOL 108 07/11/2021 0927   CHOL 137 04/09/2014 1115   TRIG 123 07/11/2021 0927   TRIG 182 (H) 04/09/2014 1115   HDL 38 (L) 07/11/2021 0927   HDL 32 (L) 04/09/2014 1115   CHOLHDL 2.8 07/11/2021 0927   CHOLHDL 3.2 06/28/2021 2349   VLDL 14 06/28/2021 2349   LDLCALC 48 07/11/2021 0927   LDLCALC 69 04/09/2014 1115    No BP recorded.  {Refresh Note OR Click here to enter BP  :1}***    ECG personally reviewed by me today: ***  No significant changes from ***  {Does this patient have ATRIAL FIBRILLATION?[phone]}   Assessment & Plan   ***      Disposition: ***   Etta Grandchild. Derwin Reddy, DNP, NP-C     05/19/2022, 4:41 PM [facility] [address] Office [phone] Fax [phone]

## 2022-05-20 ENCOUNTER — Ambulatory Visit: Payer: Medicare PPO | Attending: General Practice | Admitting: Student

## 2022-05-20 ENCOUNTER — Encounter: Payer: Self-pay | Admitting: General Practice

## 2022-05-20 VITALS — BP 138/66 | HR 88 | Ht 68.0 in | Wt 173.8 lb

## 2022-05-20 DIAGNOSIS — R001 Bradycardia, unspecified: Secondary | ICD-10-CM | POA: Diagnosis not present

## 2022-05-20 DIAGNOSIS — I480 Paroxysmal atrial fibrillation: Secondary | ICD-10-CM | POA: Diagnosis not present

## 2022-05-20 DIAGNOSIS — D649 Anemia, unspecified: Secondary | ICD-10-CM

## 2022-05-20 DIAGNOSIS — R600 Localized edema: Secondary | ICD-10-CM

## 2022-05-20 DIAGNOSIS — I2510 Atherosclerotic heart disease of native coronary artery without angina pectoris: Secondary | ICD-10-CM | POA: Diagnosis not present

## 2022-05-20 DIAGNOSIS — Z950 Presence of cardiac pacemaker: Secondary | ICD-10-CM

## 2022-05-20 DIAGNOSIS — E785 Hyperlipidemia, unspecified: Secondary | ICD-10-CM

## 2022-05-20 DIAGNOSIS — R197 Diarrhea, unspecified: Secondary | ICD-10-CM | POA: Diagnosis not present

## 2022-05-20 NOTE — Patient Instructions (Signed)
Medication Instructions:  The current medical regimen is effective;  continue present plan and medications as directed. Please refer to the Current Medication list given to you today.  *If you need a refill on your cardiac medications before your next appointment, please call your pharmacy*  Lab Work: [place] If you have labs (blood work) drawn today and your tests are completely normal, you will receive your results only by: [place] (if you have MyChart) OR A paper copy in the mail If you have any lab test that is abnormal or we need to change your treatment, we will call you to review the results.  Testing/Procedures: NONE  Follow-Up: At [facility], you and your health needs are our priority.  As part of our continuing mission to provide you with exceptional heart care, we have created designated Provider Care Teams.  These Care Teams include your primary Cardiologist (physician) and Advanced Practice Providers (APPs -  Physician Assistants and Nurse Practitioners) who all work together to provide you with the care you need, when you need it.  Your next appointment:   3 month(s)  The format for your next appointment:   In Person  Provider:   Shelva Majestic, MD  or  Mayra Reel, FNP-C         Other Instructions TAKE AND LOG YOUR WEIGHT DAILY  Important Information About Sugar

## 2022-05-21 ENCOUNTER — Ambulatory Visit (INDEPENDENT_AMBULATORY_CARE_PROVIDER_SITE_OTHER): Payer: Medicare PPO

## 2022-05-21 DIAGNOSIS — I442 Atrioventricular block, complete: Secondary | ICD-10-CM

## 2022-05-21 LAB — CUP PACEART REMOTE DEVICE CHECK
Battery Remaining Longevity: 138 mo
Battery Voltage: 3.01 V
Brady Statistic RA Percent Paced: 0 %
Brady Statistic RV Percent Paced: 17.98 %
Date Time Interrogation Session: 20240111021042
Implantable Lead Connection Status: 753985
Implantable Lead Connection Status: 753985
Implantable Lead Implant Date: 20210712
Implantable Lead Implant Date: 20210712
Implantable Lead Location: 753859
Implantable Lead Location: 753860
Implantable Lead Model: 5076
Implantable Lead Model: 5076
Implantable Pulse Generator Implant Date: 20210712
Lead Channel Impedance Value: 285 Ohm
Lead Channel Impedance Value: 342 Ohm
Lead Channel Impedance Value: 361 Ohm
Lead Channel Impedance Value: 418 Ohm
Lead Channel Pacing Threshold Amplitude: 0.625 V
Lead Channel Pacing Threshold Amplitude: 0.75 V
Lead Channel Pacing Threshold Pulse Width: 0.4 ms
Lead Channel Pacing Threshold Pulse Width: 0.4 ms
Lead Channel Sensing Intrinsic Amplitude: 0.5 mV
Lead Channel Sensing Intrinsic Amplitude: 0.5 mV
Lead Channel Sensing Intrinsic Amplitude: 11.375 mV
Lead Channel Sensing Intrinsic Amplitude: 11.375 mV
Lead Channel Setting Pacing Amplitude: 2.5 V
Lead Channel Setting Pacing Pulse Width: 0.4 ms
Lead Channel Setting Sensing Sensitivity: 0.9 mV
Zone Setting Status: 755011

## 2022-05-21 LAB — CBC
Hematocrit: 32 % — ABNORMAL LOW (ref 37.5–51.0)
Hemoglobin: 10 g/dL — ABNORMAL LOW (ref 13.0–17.7)
MCH: 27.3 pg (ref 26.6–33.0)
MCHC: 31.3 g/dL — ABNORMAL LOW (ref 31.5–35.7)
MCV: 87 fL (ref 79–97)
Platelets: 249 10*3/uL (ref 150–450)
RBC: 3.66 x10E6/uL — ABNORMAL LOW (ref 4.14–5.80)
RDW: 13.4 % (ref 11.6–15.4)
WBC: 5.4 10*3/uL (ref 3.4–10.8)

## 2022-05-21 LAB — BASIC METABOLIC PANEL
BUN/Creatinine Ratio: 15 (ref 10–24)
BUN: 22 mg/dL (ref 8–27)
CO2: 19 mmol/L — ABNORMAL LOW (ref 20–29)
Calcium: 8.9 mg/dL (ref 8.6–10.2)
Chloride: 108 mmol/L — ABNORMAL HIGH (ref 96–106)
Creatinine, Ser: 1.49 mg/dL — ABNORMAL HIGH (ref 0.76–1.27)
Glucose: 91 mg/dL (ref 70–99)
Potassium: 4.1 mmol/L (ref 3.5–5.2)
Sodium: 142 mmol/L (ref 134–144)
eGFR: 46 mL/min/{1.73_m2} — ABNORMAL LOW (ref >59)

## 2022-06-10 NOTE — Progress Notes (Signed)
Remote pacemaker transmission.   

## 2022-07-14 DIAGNOSIS — H903 Sensorineural hearing loss, bilateral: Secondary | ICD-10-CM | POA: Diagnosis not present

## 2022-07-20 ENCOUNTER — Encounter: Payer: Self-pay | Admitting: Cardiovascular Disease

## 2022-07-20 ENCOUNTER — Ambulatory Visit: Payer: Medicare PPO | Attending: Cardiovascular Disease | Admitting: Cardiovascular Disease

## 2022-07-20 DIAGNOSIS — Z952 Presence of prosthetic heart valve: Secondary | ICD-10-CM | POA: Diagnosis not present

## 2022-07-20 DIAGNOSIS — I2510 Atherosclerotic heart disease of native coronary artery without angina pectoris: Secondary | ICD-10-CM | POA: Diagnosis not present

## 2022-07-20 DIAGNOSIS — Z950 Presence of cardiac pacemaker: Secondary | ICD-10-CM

## 2022-07-20 DIAGNOSIS — I480 Paroxysmal atrial fibrillation: Secondary | ICD-10-CM

## 2022-07-20 DIAGNOSIS — Z951 Presence of aortocoronary bypass graft: Secondary | ICD-10-CM

## 2022-07-20 DIAGNOSIS — G4733 Obstructive sleep apnea (adult) (pediatric): Secondary | ICD-10-CM | POA: Diagnosis not present

## 2022-07-20 DIAGNOSIS — I4821 Permanent atrial fibrillation: Secondary | ICD-10-CM

## 2022-07-20 DIAGNOSIS — R600 Localized edema: Secondary | ICD-10-CM | POA: Diagnosis not present

## 2022-07-20 MED ORDER — FUROSEMIDE 40 MG PO TABS: 40.0000 mg | ORAL_TABLET | Freq: Every day | ORAL | 3 refills | Status: DC

## 2022-07-20 NOTE — Progress Notes (Signed)
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    Past Medical History:  Diagnosis Date   Arthritis    "just a touch in my hands" (11/24/2016)   Asthma    Atherosclerosis of renal artery ([place])    RENAL DOPPLER, 12/10/2011 - Left renal artery demonstrated narrowing with elevated velocities consistent with a 1-59% diameter reduction   CKD (chronic kidney disease) stage 3, GFR 30-59 ml/min (HCC)    "stable now since they backed off the water pills" (11/24/2016)   Coronary artery disease    a. 1994 s/p CABG x 4 (LIMA-LAD, VG->D2, VG->OM, VG->RCA); b. 02/2004 PCI SVG-D2 (3.5x16 Taxus DES). VG->RCA 100. Sev apical LAD dzs distal to LIMA insertion; c. 11/2016 Cath: LAD 95p/144m, D2 100ost, LCX 100ost, 70p/m, RCA 100p/m, RPDA fills via L->R collats. VG->RCA 100, VG->D2 20 ost, patent prox stent, 10m, LIMA->LAD ok, VG->OM3 20p. EF 55%-->Med Rx.   GERD (gastroesophageal reflux disease)    High cholesterol    History of lower GI bleeding    a. 09/2014 GIB due to diverticulosis/diverticulitis.   Iron deficiency anemia    Labile Hypertension    Moderate aortic stenosis    a. 10/2011 Echo:  EF >55%, mild-mod TR, mild-mod AS, mod Ca2+ of AoV leaflets; b. 07/2016 Echo: EF 60-65%, no rwma, Gr1 DD, mod AS [(S) mean grad 81mmHg, peak grad 65mmHg. Valve area (VTI): 1.33cm^2, (Vmax) 1.44cm^2. Mild MR]; c. 02/2018 Echo: EF 55-60%, no rwma, GR1 DD, Mod AS [Peak Vel (S): 319cm/s, Mean grad (S) 31mmHg, peak grad (S) 20mmHg].    PAF (paroxysmal atrial fibrillation) (HCC)    a. CHA2DS2VASc = 4-->Eliquis.   S/P TAVR (transcatheter aortic valve replacement) 11/14/2019   s/p TAVR with a 26 mm Edwards S3U via the TF approach by Drs Angelena Form & Bartle   Sleep apnea     Past Surgical History:  Procedure Laterality Date   CARDIAC CATHETERIZATION  02/27/2004   Coronary intervention and medical management   CARDIAC CATHETERIZATION  11/24/2016   CARDIOVERSION N/A 12/20/2019   Procedure: CARDIOVERSION;  Surgeon: Skeet Latch, MD;  Location: [facility];  Service: Cardiovascular;  Laterality: N/A;   CARDIOVERSION N/A 02/05/2020   Procedure: CARDIOVERSION;  Surgeon: Josue Hector, MD;  Location: [facility];  Service: Cardiovascular;  Laterality: N/A;   CATARACT EXTRACTION W/ INTRAOCULAR LENS IMPLANT Left    CORONARY ANGIOPLASTY  1994 X 2   "before bypass surgery"   CORONARY ANGIOPLASTY WITH STENT PLACEMENT  03/04/2004   SVG supplying the diagonal vessel stented with a 3.5x47mm Taxus stent post dilated to 4.0 mm   CORONARY ARTERY BYPASS GRAFT  1994   "CABG X4"   INGUINAL HERNIA REPAIR Right    PACEMAKER IMPLANT N/A 11/20/2019   Procedure: PACEMAKER IMPLANT;  Surgeon: Deboraha Sprang, MD;  Location: [facility];  Service: Cardiovascular;  Laterality: N/A;   RIGHT HEART CATH AND CORONARY/GRAFT ANGIOGRAPHY N/A 11/24/2016   Procedure: Right Heart Cath and Coronary/Graft Angiography;  Surgeon: Troy Sine, MD;  Location: [facility];  Service: Cardiovascular;  Laterality: N/A;   RIGHT/LEFT HEART CATH AND CORONARY/GRAFT ANGIOGRAPHY N/A 10/18/2019   Procedure: RIGHT/LEFT HEART CATH AND CORONARY/GRAFT ANGIOGRAPHY;  Surgeon: Troy Sine, MD;  Location: [facility];  Service: Cardiovascular;  Laterality: N/A;   TEE WITHOUT CARDIOVERSION N/A 11/14/2019   Procedure: TRANSESOPHAGEAL ECHOCARDIOGRAM (TEE);  Surgeon: Burnell Blanks, MD;  Location: [facility];  Service: Open Heart Surgery;   Laterality: N/A;   TONSILLECTOMY AND ADENOIDECTOMY     TRANSCATHETER AORTIC VALVE REPLACEMENT, TRANSFEMORAL N/A 11/14/2019   Procedure: TRANSCATHETER AORTIC VALVE REPLACEMENT, TRANSFEMORAL;  Surgeon: Burnell Blanks, MD;  Location: [facility];  Service: Open Heart Surgery;  Laterality: N/A;    Allergies  Allergen Reactions   Ramipril Swelling and Other (See Comments)    Mouth swelling Other reaction(s): Tongue swelling?   Guaifenesin Er Hives, Swelling and Other (See Comments)    Mouth swelling Other reaction(s): swelling of tongue   Azithromycin     Other reaction(s): Does not work Other reaction(s): Does not work   Iodinated [employer] Other (See Comments)    Made eyes change each time Other reaction(s): Unknown    Current Outpatient Medications  Medication Sig Dispense Refill   albuterol (VENTOLIN HFA) 108 (90 Base) MCG/ACT inhaler USE 2 INHALATIONS BY MOUTH EVERY 4 - 6 HOURS AS NEEDED 18 g 0   amLODipine (NORVASC) 10 MG tablet Take 1 tablet (10 mg total) by mouth daily. 90 tablet 3   apixaban (ELIQUIS) 2.5 MG TABS tablet Take 1 tablet (2.5 mg total) by mouth 2 (two) times daily. 180 tablet 3   atorvastatin (LIPITOR) 20 MG tablet Take 1 tablet (20 mg total) by mouth at bedtime. 30 tablet 0   carvedilol (COREG) 3.125 MG tablet TAKE 1 TABLET BY MOUTH TWICE A DAY WITH A MEAL 180 tablet 2   DYMISTA 137-50 MCG/ACT SUSP Place 1-2 sprays into both nostrils daily as needed.     ezetimibe (ZETIA) 10 MG tablet TAKE 1 TABLET BY MOUTH EVERY DAY 90 tablet 3   fenofibrate (TRICOR) 145 MG tablet TAKE 1/2 TABLETS BY MOUTH DAILY 45 tablet 8   finasteride (PROSCAR) 5 MG tablet Take 5 mg by mouth daily.     fluticasone (FLONASE) 50 MCG/ACT nasal spray Place 2 sprays into both nostrils daily as needed for allergies or rhinitis.      furosemide (LASIX) 40 MG tablet Take 1 tablet (40 mg total) by mouth daily. 90 tablet 3   ipratropium (ATROVENT) 0.03 % nasal spray Place 2 sprays  into both nostrils 2 (two) times daily. 30 mL 11   isosorbide mononitrate (IMDUR) 30 MG 24 hr tablet Take 1 tablet (30 mg total) by mouth daily. 90 tablet 3   levothyroxine (SYNTHROID) 25 MCG tablet Take 25 mcg by mouth daily before  breakfast.      loratadine (CLARITIN) 10 MG tablet Take 1 tablet (10 mg total) by mouth 2 (two) times daily as needed for allergies (Can use an extra dose during flares). 60 tablet 11   Multiple Vitamins-Minerals (CENTRUM SILVER 50+MEN) TABS Take 1 tablet by mouth at bedtime.     nitroGLYCERIN (NITROLINGUAL) 0.4 MG/SPRAY spray PLACE 1 SPRAY UNDER THE TONGUE EVERY 5 (FIVE) MINUTES X 3 DOSES AS NEEDED FOR CHEST PAIN. 12 g 1   Omega-3 Fatty Acids (FISH OIL) 1000 MG CAPS Take 1,000 mg by mouth 2 (two) times daily.      pantoprazole (PROTONIX) 40 MG tablet TAKE 1 TABLET BY MOUTH EVERY DAY 90 tablet 1   potassium chloride SA (KLOR-CON M) 20 MEQ tablet Take 1 tablet (20 mEq total) by mouth daily. 90 tablet 3   ranolazine (RANEXA) 500 MG 12 hr tablet TAKE 1 TABLET BY MOUTH TWICE A DAY 180 tablet 2   vitamin C (ASCORBIC ACID) 500 MG tablet Take 500 mg by mouth at bedtime.      vitamin E 400 UNIT capsule Take 400 Units by mouth daily.     zinc gluconate 50 MG tablet Take 50 mg by mouth daily.     No current facility-administered medications for this visit.    Socially he is married and has 2 children and 4 grandchildren. There is no tobacco alcohol use. Recently he was has been very active.  ROS General: Negative; No fevers, chills, or night sweats;  HEENT: He is hard of hearing; A new complaint is that of ringing in his ears; right eye diplopia; no sinus congestion, difficulty swallowing Pulmonary: Negative; No cough, wheezing, shortness of breath, hemoptysis Cardiovascular: See HPI GI: Positive for recent GI bleed secondary to diverticular disease GU: Negative; No dysuria, hematuria, or difficulty voiding Musculoskeletal: Negative; no myalgias, joint pain, or  weakness Hematologic/Oncology: Negative; no easy bruising, bleeding Endocrine: Negative; no heat/cold intolerance; no diabetes Neuro: Visual changes following recent Skin: Negative; No rashes or skin lesions Psychiatric: Negative; No behavioral problems, depression Sleep: Positive for OSA with history of snoring, daytime sleepiness, no bruxism, restless legs, hypnogognic hallucinations, no cataplexy Other comprehensive 14 point system review is negative.  PE BP 124/70   Pulse 76   Ht 5\' 8"  (1.727 m)   Wt 178 lb 9.6 oz (81 kg)   SpO2 98%   BMI 27.16 kg/m    Repeat blood pressure by me was 132/68  Wt Readings from Last 3 Encounters:  07/20/22 178 lb 9.6 oz (81 kg)  05/20/22 173 lb 12.8 oz (78.8 kg)  04/14/22 191 lb 12.8 oz (87 kg)   General: Alert, oriented, no distress.  Skin: normal turgor, no rashes, warm and dry HEENT: Normocephalic, atraumatic. Pupils equal round and reactive to light; sclera anicteric; extraocular muscles intact;  Nose without nasal septal hypertrophy Mouth/Parynx benign; Mallinpatti scale 3 Neck: No JVD, no carotid bruits; normal carotid upstroke Lungs: clear to ausculatation and percussion; no wheezing or rales Chest wall: without tenderness to palpitation Heart: PMI not displaced, irregular irregular heart rate in the 70s, s1 s2 normal, 1/6 systolic murmur, no diastolic murmur, no rubs, gallops, thrills, or heaves Abdomen: soft, nontender; no hepatosplenomehaly, BS+; abdominal aorta nontender and not dilated by palpation. Back: no CVA tenderness Pulses 2+ Musculoskeletal: full range of motion, normal strength, no joint deformities Extremities: Bilateral lower extremity edema 2+, left greater than right lower extremity; no clubbing cyanosis or edema, Homan's sign negative  Neurologic: grossly nonfocal;  Cranial nerves grossly wnl Psychologic: Normal mood and affect  July 20, 2022 ECG (independently read by me): Atrial fibrillation at 76, overriding  pacemaker  April 14, 2022 ECG (independently read by me): V paced rhythm at 76   December 03, 2021 ECG (independently read by me): V paced rhythm   July 21, 2021 ECG (independently read by me): Sinus rhythm with frequent V pacing  March 19, 2021 ECG (independently read by me): V paced at 55. Underlying AF   November 05, 2020 ECG (independently read by me):  Atrial fibrillation at 76, LVH with QRS widening, LAD;  March 3,2022 ECG (independently read by me): Atrial fibrillation at 64, QTc 480 msec  ECG (independently read by me): Atrial fibrillation at 62 with a competing junctional pacemaker, Left axis deviation, QTc 460 msec  March 2020 ECG (independently read by me): Sinus rhythm at 71 bpm.  First-degree block with 212 ms.  Right bundle branch block with repolarization changes.  Left anterior hemiblock.  January 26, 2018 ECG (independently read by me): Sinus bradycardia at 47 bpm with first-degree AV block.  PR interval 218 ms.  LVH with QRS widening.  Left axis deviation.  October 28, 2017 ECG (independently read by me): Atrial fibrillation with variable rate that seems to be more in the 50s to 60s but following a pause drops into the 40s  August 26, 2017 ECG (independently read by me): atrial fibrillation at 56 bpm.  Nonspecific ST changes.  QTc interval 476 ms.  February 2019 ECG (independently read by me): Atrial fibrillation at 71 bpm.  LVH by voltage in aVL.  Nonspecific T changes.  04/27/2017 ECG (independently read by me): Normal sinus rhythm at 60 bpm.  Nonspecific intraventricular block.  Anterior T-wave abnormality  03/23/2017 ECG (independently read by me): atrial fibrillation at 64 bpm.  Nonspecific interventricular block.  Nonspecific T wave abnormality.  02/17/2017 ECG (independently read by me): Atrial fibrillation at 72 bpm.  RV conduction delay.  QTc interval 453 ms.  01/29/2017 ECG (independently read by me): Atrial fibrillation at 56 bpm.  LVH by voltage criteria.   Nonspecific ST changes.  QTc interval 430 ms.  August 2018 ECG (independently read by me): Sinus bradycardia 59 bpm.  LVH by voltage.  QTc interval 469 ms.  No significant ST-T changes.  April 2018 ECG (independently read by me): Sinus rhythm at 61 bpm.  RV conduction delay.  LVH.  March 2018 ECG (independently read by me): Atrial fibrillation at 71 bpm.  RV conduction delay.  QTc interval 456 ms.  Left axis deviation.  February 2018 ECG (independently read by me): Normal sinus rhythm at 60 bpm.  LVH.  QTc interval at 464 ms.  Nonspecific T abnormality  December 2017 ECG (independently read by me):  Sinus bradycardia 57 bpm.  LVH by voltage. No significant ST changes.  January 10, 2016 ECG (independently read by me): Normal sinus rhythm at 64 bpm.  LVH.  QTc interval 468 ms.  December 2016 ECG (independently read by me):  Sinus bradycardia 51 bpm.  No ectopy. QTc interval 429 ms.  LVH by voltage criteria in aVL.  September 2016 ECG (independently read by me):  Sinus bradycardia with first-degree AV block. RV conduction delay. No significant ST-T changes.   August 2016 ECG (independently read by me): Sinus bradycardia 57 bpm.  Mild RV conduction delay.  May 2016 ECG (independently read by me): Sinus bradycardia at 49 bpm with first-degree AV block with a PR interval 218 ms.  Right reticular conduction delay.  December 2015 ECG (independently read by me).  For these: Sinus bradycardia 57 bpm.  First-degree AV block with a PR interval at 224 ms.  No significant ST-T changes.  March 2015 ECG (independently read by me): Sinus rhythm at 59 beats per minute. Mild LVH by voltage criteria in aVL. Normal intervals.  01/05/2013 ECG: Normal sinus rhythm at 62. Mild RV conduction delay. PR interval 204 ms, QTc interval 452 ms.  LABS:    Latest Ref Rng & Units 05/20/2022    3:41 PM 04/15/2022    1:26 PM 12/03/2021    3:33 PM  BMP  Glucose 70 - 99 mg/dL 91  92  84   BUN 8 - 27 mg/dL 22  18  27     Creatinine 0.76 - 1.27 mg/dL 1.49  1.28  1.63   BUN/Creat Ratio 10 - 24 15   17    Sodium 134 - 144 mmol/L 142  139  144   Potassium 3.5 - 5.2 mmol/L 4.1  4.1  4.4   Chloride 96 - 106 mmol/L 108  110  108   CO2 20 - 29 mmol/L 19  21  20    Calcium 8.6 - 10.2 mg/dL 8.9  9.1  9.8       Latest Ref Rng & Units 04/15/2022    1:26 PM 06/28/2021    5:23 PM 11/07/2020   12:00 AM  Hepatic Function  Total Protein 6.5 - 8.1 g/dL 7.0  6.5  6.5   Albumin 3.5 - 5.0 g/dL 3.1  3.5  4.2   AST 15 - 41 U/L 30  25  24    ALT 0 - 44 U/L 30  22  18    Alk Phosphatase 38 - 126 U/L 43  43  62   Total Bilirubin 0.3 - 1.2 mg/dL 0.6  0.6  0.4       Latest Ref Rng & Units 05/20/2022    3:41 PM 04/15/2022    1:26 PM 12/03/2021    3:33 PM  CBC  WBC 3.4 - 10.8 x10E3/uL 5.4  7.9  6.6   Hemoglobin 13.0 - 17.7 g/dL 10.0  9.9  12.4   Hematocrit 37.5 - 51.0 % 32.0  31.6  37.3   Platelets 150 - 450 x10E3/uL 249  263  177    Lab Results  Component Value Date   MCV 87 05/20/2022   MCV 90.5 04/15/2022   MCV 94 12/03/2021   Lab Results  Component Value Date   TSH 1.671 06/29/2021   Lab Results  Component Value Date   HGBA1C 5.5 06/28/2021     Lipid Panel     Component Value Date/Time   CHOL 108 07/11/2021 0927   CHOL 137 04/09/2014 1115   TRIG 123 07/11/2021 0927   TRIG 182 (H) 04/09/2014 1115   HDL 38 (L) 07/11/2021 0927   HDL 32 (L) 04/09/2014 1115   CHOLHDL 2.8 07/11/2021 0927   CHOLHDL 3.2 06/28/2021 2349   VLDL 14 06/28/2021 2349   LDLCALC 48 07/11/2021 0927   LDLCALC 69 04/09/2014 1115     RADIOLOGY: No results found.  A CPAP download was obtained in the office with reference to his obstructive sleep apnea from August 18 through January 24, 2018. He is 100% compliant with usage days and averaging 6 hours and 35 minutes of usage daily.  At his 12 cm pressure, AHI is excellent at 0.9.  He does have a mask leak.  CPAP November  10 through April 18, 2018: Compliance 97% usage days and usage  greater than 4 hours.  Average use 6 hours 46 minutes.  At 12 cm pressure, AHI 1.2/h.  CPAP download June 25, 2018 through July 24, 2018.  Usage 87%, usage greater than 4 hours 83%, average usage on days used 6 hours and 34 minutes.  AHI 1.2 8012 cm pressure.  A download was obtained in the office from May 2022 through November 04, 2020.  Usage days only 63%.  Average usage is 6 hours 22 minutes.  AHI 5.0 with a auto minimum pressure 11 maximum pressure 16 with 95 percentile pressure 11.5.  There is mask leak.  A download from February 12 through July 21, 2021 showed 50% use although the patient was hospitalized during some of this time..  Average use 5 hours and 50 minutes.  There is significant mask leak contributing to increased AHI at 9.7.  IMPRESSION:  1. CAD in native artery   2. Hx of CABG   3. Bilateral lower extremity edema   4. Permanent atrial fibrillation ([place])   5. S/P TAVR (transcatheter aortic valve replacement)   6. OSA (obstructive sleep apnea)   7. Pacemaker    ASSESSMENT AND PLAN: Jorge Cherry is an 87 year-old  male who is status post CABG revascularization surgery in 1994.  Subsequently he underwent DES stenting to the graft supplying the diagonal vessel in 2003.  In July 2017 he was hospitalized with atrial fibrillation in the setting of bradycardia and hypotension leading to medication adjustment  An echo Doppler study of 11/21/2015 showed an EF of 55-60%, mild aortic stenosis with a valve area of 1.75 mm per there was mild pulmonary hypertension. Subsequently he had complained of experiencing some episodes of chest tightness and his symptoms improved with further titration of nitrates as well as beta blocker therapy.  He developed significant anemia, which undoubtedly exacerbated his anginal symptomatology.  At catheterization in July 2018 although his LIMA to mid LAD and vein graft to diagonal vessel are patent the very proximal LAD had a 95% stenosis which supplies a  diagonal vessel, which was not supplied by the grafts.  His native RCA and graft to RCA was occluded and there is some collateralization to the distal RCA via the vein graft supplying the circumflex marginal vessel.  His anginal symptoms improved with stabilization of his hemoglobin.  Spironolactone was discontinued secondary to renal insufficiency.  Due to progressive aortic stenosis he underwent TAVR with 26 mm SAPIEN 3 valve on November 14, 2019 and due to subsequent prolonged pauses with syncope underwent dual chamber Medtronic permanent pacemaker by Dr. Caryl Comes on November 20, 2019.  He has been on Eliquis therapy for persistent long-term atrial fibrillation.  He has had issues with lower extremity edema, and was hospitalized with suspected CVA in the setting of missed doses of Eliquis in February 2023.  He subsequently developed diplopia of his right eye and is now wearing prism glasses.  He has developed increasing creatinine greater than 1.5 and his Eliquis dose has been reduced to 2.5 mg twice a day.  Presently, he is without chest pain or anginal type symptoms.  He was recently treated with acute respiratory illness for which she received Augmentin Mucinex and Delsym.  His ECG shows V paced rhythm with underlying AF.  Blood pressure today is stable on his regimen of amlodipine 10 mg, carvedilol 3.125 mg twice a day, furosemide 40 mg and isosorbide 30 mg daily.  He continues  to be on Ranexa 500 mg twice a day without anginal symptoms.   At his last visit with me in December 2023 I recommended he reduce amlodipine to 5 mg and change furosemide to torsemide.  Apparently, this was never done.  Was seen by Erlene Quan, NP on May 20, 2022 his weight was down approximately 18 pounds from December but he also was having at that time significant diarrhea.  Laboratory in January 2024 showed hemoglobin 10 hematocrit 32.0.  Creatinine was 1.49 consistent with stage IIIa CKD.  Presently he is without chest pain or  significant shortness of breath.  With his continued edema, I have recommended he D again decrease amlodipine to 5 mg.  Since he never started torsemide, I will increase furosemide from 20 mg up to 40 mg daily.  He continues to use CPAP therapy.  His most recent download from February 8 through July 17, 2022 shows AHI 2.5 at with 95th percentile pressure at 11 cm.  He continues to be followed by Dr. Caryl Comes.  I will see him in 6 months for reevaluation at that time and most likely would recommend follow-up echocardiography status post prior TAVR.   Troy Sine, MD, [facility]  07/24/2022 10:12 AM

## 2022-07-20 NOTE — Patient Instructions (Addendum)
Medication Instructions:   Restart Furosemide 40 mg  daily  *If you need a refill on your cardiac medications before your next appointment, please call your pharmacy*   Lab Work: Not needed    Testing/Procedures: Not needed    Follow-Up: At [facility], you and your health needs are our priority.  As part of our continuing mission to provide you with exceptional heart care, we have created designated Provider Care Teams.  These Care Teams include your primary Cardiologist (physician) and Advanced Practice Providers (APPs -  Physician Assistants and Nurse Practitioners) who all work together to provide you with the care you need, when you need it.     Your next appointment:   6 month(s)  The format for your next appointment:   In Person  Provider:   Shelva Majestic, MD

## 2022-07-23 ENCOUNTER — Other Ambulatory Visit: Payer: Self-pay | Admitting: Cardiovascular Disease

## 2022-07-24 ENCOUNTER — Encounter: Payer: Self-pay | Admitting: Cardiovascular Disease

## 2022-07-29 DIAGNOSIS — H903 Sensorineural hearing loss, bilateral: Secondary | ICD-10-CM | POA: Diagnosis not present

## 2022-07-29 DIAGNOSIS — H6983 Other specified disorders of Eustachian tube, bilateral: Secondary | ICD-10-CM | POA: Diagnosis not present

## 2022-07-29 NOTE — Telephone Encounter (Signed)
Per patient office visit 07/20/22 - Dr Claiborne Billings stopped torsemide and restart Furosemide with an increase to 40 mg  Daily.   Patient had never started the Torsemide that was prescribed .

## 2022-08-07 ENCOUNTER — Other Ambulatory Visit: Payer: Self-pay | Admitting: Cardiovascular Disease

## 2022-08-07 ENCOUNTER — Other Ambulatory Visit: Payer: Self-pay | Admitting: Internal Medicine

## 2022-08-07 DIAGNOSIS — I10 Essential (primary) hypertension: Secondary | ICD-10-CM

## 2022-08-07 DIAGNOSIS — I4819 Other persistent atrial fibrillation: Secondary | ICD-10-CM

## 2022-08-09 ENCOUNTER — Other Ambulatory Visit: Payer: Self-pay | Admitting: Cardiovascular Disease

## 2022-08-09 DIAGNOSIS — I10 Essential (primary) hypertension: Secondary | ICD-10-CM

## 2022-08-10 MED ORDER — AMLODIPINE BESYLATE 10 MG PO TABS: 10.0000 mg | ORAL_TABLET | Freq: Every day | ORAL | 1 refills | Status: DC

## 2022-08-10 NOTE — Telephone Encounter (Signed)
Prescription refill request for Eliquis received. Indication: a fib Last office visit: 07/20/22 Scr: 1.49 Age: 87 Weight: 81kg  Needs dose increase

## 2022-08-16 ENCOUNTER — Telehealth: Payer: Self-pay | Admitting: Physician Assistant

## 2022-08-16 DIAGNOSIS — I25708 Atherosclerosis of coronary artery bypass graft(s), unspecified, with other forms of angina pectoris: Secondary | ICD-10-CM

## 2022-08-16 MED ORDER — NITROGLYCERIN 0.4 MG/SPRAY TL SOLN: 1.0000 | 0 refills | Status: DC | PRN

## 2022-08-16 NOTE — Telephone Encounter (Signed)
   The patient and his wife called the answering service after-hours today requesting SL NTG spray refill.  Hx reviewed, prior CAD s/p CABG, known to have stable angina with higher levels of activity. He had been doing well lately. Today he had an episode of mild chest discomfort. He had picked up a large bag of cedar chips out of the back of the truck and moved it about 20 feet. While leaning over and spreading out the chips he developed some mild chest pain so straightened up, stopped what he was doing, and tried to give a spray of SL NTG but his bottle was out. He stopped what he was doing and the pain subsided. He otherwise feels fine. The patient and his wife report this is no different or worse than his baseline and they do not think he needs to seek care, but they would like a refill of SL NTG spray sent in - they are currently visiting in [place] and state that [facility] not [facility]. Per request, sent in refill for SL NTG spray and reviewed instructions for use. ER precautions advised. They also know to reach out if they feel symptoms warrant sooner evaluation. However, the patient feels this episode was consistent with his known history without acute changes. They verbalized understanding and gratitude.   Will cc to Dr. Tresa Endo as [place] Childes.  Laurann Montana, PA-C

## 2022-08-20 ENCOUNTER — Ambulatory Visit (INDEPENDENT_AMBULATORY_CARE_PROVIDER_SITE_OTHER): Payer: Medicare PPO

## 2022-08-20 DIAGNOSIS — I442 Atrioventricular block, complete: Secondary | ICD-10-CM | POA: Diagnosis not present

## 2022-08-20 LAB — CUP PACEART REMOTE DEVICE CHECK
Battery Remaining Longevity: 135 mo
Battery Voltage: 3 V
Brady Statistic RA Percent Paced: 0 %
Brady Statistic RV Percent Paced: 14.54 %
Date Time Interrogation Session: 20240410224447
Implantable Lead Connection Status: 753985
Implantable Lead Connection Status: 753985
Implantable Lead Implant Date: 20210712
Implantable Lead Implant Date: 20210712
Implantable Lead Location: 753859
Implantable Lead Location: 753860
Implantable Lead Model: 5076
Implantable Lead Model: 5076
Implantable Pulse Generator Implant Date: 20210712
Lead Channel Impedance Value: 285 Ohm
Lead Channel Impedance Value: 361 Ohm
Lead Channel Impedance Value: 399 Ohm
Lead Channel Impedance Value: 418 Ohm
Lead Channel Pacing Threshold Amplitude: 0.625 V
Lead Channel Pacing Threshold Amplitude: 0.75 V
Lead Channel Pacing Threshold Pulse Width: 0.4 ms
Lead Channel Pacing Threshold Pulse Width: 0.4 ms
Lead Channel Sensing Intrinsic Amplitude: 1.375 mV
Lead Channel Sensing Intrinsic Amplitude: 1.375 mV
Lead Channel Sensing Intrinsic Amplitude: 11.25 mV
Lead Channel Sensing Intrinsic Amplitude: 11.25 mV
Lead Channel Setting Pacing Amplitude: 2.5 V
Lead Channel Setting Pacing Pulse Width: 0.4 ms
Lead Channel Setting Sensing Sensitivity: 0.9 mV
Zone Setting Status: 755011

## 2022-09-02 ENCOUNTER — Other Ambulatory Visit: Payer: Self-pay | Admitting: Cardiovascular Disease

## 2022-09-09 ENCOUNTER — Other Ambulatory Visit: Payer: Self-pay | Admitting: Cardiovascular Disease

## 2022-09-10 MED ORDER — POTASSIUM CHLORIDE CRYS ER 20 MEQ PO TBCR: 20.0000 meq | EXTENDED_RELEASE_TABLET | Freq: Every day | ORAL | 3 refills | Status: DC

## 2022-09-25 DIAGNOSIS — E785 Hyperlipidemia, unspecified: Secondary | ICD-10-CM | POA: Diagnosis not present

## 2022-09-25 DIAGNOSIS — G3184 Mild cognitive impairment, so stated: Secondary | ICD-10-CM | POA: Diagnosis not present

## 2022-09-25 DIAGNOSIS — I350 Nonrheumatic aortic (valve) stenosis: Secondary | ICD-10-CM | POA: Diagnosis not present

## 2022-09-25 DIAGNOSIS — R202 Paresthesia of skin: Secondary | ICD-10-CM | POA: Diagnosis not present

## 2022-09-25 DIAGNOSIS — I25810 Atherosclerosis of coronary artery bypass graft(s) without angina pectoris: Secondary | ICD-10-CM | POA: Diagnosis not present

## 2022-09-25 DIAGNOSIS — N1831 Chronic kidney disease, stage 3a: Secondary | ICD-10-CM | POA: Diagnosis not present

## 2022-09-25 DIAGNOSIS — I130 Hypertensive heart and chronic kidney disease with heart failure and stage 1 through stage 4 chronic kidney disease, or unspecified chronic kidney disease: Secondary | ICD-10-CM | POA: Diagnosis not present

## 2022-09-25 DIAGNOSIS — E032 Hypothyroidism due to medicaments and other exogenous substances: Secondary | ICD-10-CM | POA: Diagnosis not present

## 2022-09-25 DIAGNOSIS — I5032 Chronic diastolic (congestive) heart failure: Secondary | ICD-10-CM | POA: Diagnosis not present

## 2022-09-25 DIAGNOSIS — R7989 Other specified abnormal findings of blood chemistry: Secondary | ICD-10-CM | POA: Diagnosis not present

## 2022-09-25 DIAGNOSIS — D649 Anemia, unspecified: Secondary | ICD-10-CM | POA: Diagnosis not present

## 2022-09-25 NOTE — Progress Notes (Signed)
Remote pacemaker transmission.   

## 2022-11-09 ENCOUNTER — Other Ambulatory Visit: Payer: Self-pay | Admitting: Cardiovascular Disease

## 2022-11-09 DIAGNOSIS — I4819 Other persistent atrial fibrillation: Secondary | ICD-10-CM

## 2022-11-10 ENCOUNTER — Other Ambulatory Visit: Payer: Self-pay | Admitting: Cardiovascular Disease

## 2022-11-10 ENCOUNTER — Telehealth: Payer: Self-pay | Admitting: Cardiovascular Disease

## 2022-11-10 DIAGNOSIS — E785 Hyperlipidemia, unspecified: Secondary | ICD-10-CM | POA: Diagnosis not present

## 2022-11-10 DIAGNOSIS — I4819 Other persistent atrial fibrillation: Secondary | ICD-10-CM

## 2022-11-10 DIAGNOSIS — N390 Urinary tract infection, site not specified: Secondary | ICD-10-CM | POA: Diagnosis not present

## 2022-11-10 NOTE — Telephone Encounter (Signed)
Eliquis 5mg  refill request received. Patient is 87 years old, weight-81kg, Crea-1.49 on 05/20/22 (has been unstable & been on higher and lower dose of eliquis), Diagnosis-Afib, and last seen by Dr. Tresa Endo on 07/20/22. Dose is appropriate based on dosing criteria. Will send in refill to requested pharmacy.

## 2022-11-10 NOTE — Telephone Encounter (Signed)
*  STAT* If patient is at the pharmacy, call can be transferred to refill team.   1. Which medications need to be refilled? (please list name of each medication and dose if known) carvedilol (COREG) 3.125 MG tablet   ELIQUIS 5 MG TABS tablet    2. Which pharmacy/location (including street and city if local pharmacy) is medication to be sent to? [id number] [place], [place] - [address]   3. Do they need a 30 day or 90 day supply? 90

## 2022-11-10 NOTE — Telephone Encounter (Signed)
Spoke with with of pt. [facility] [place] to pt should be given an alternative to Eliquis. Informed wife that TK will be out of the office. Will chk fax for pharmacy fax.

## 2022-11-11 NOTE — Telephone Encounter (Signed)
Wife called to say that the pharmacy didn't get the refill request. Please re send refill. Please advise

## 2022-11-13 ENCOUNTER — Other Ambulatory Visit (HOSPITAL_COMMUNITY): Payer: Self-pay

## 2022-11-13 ENCOUNTER — Telehealth: Payer: Self-pay

## 2022-11-13 DIAGNOSIS — I4821 Permanent atrial fibrillation: Secondary | ICD-10-CM

## 2022-11-13 MED ORDER — RIVAROXABAN 15 MG PO TABS: 15.0000 mg | ORAL_TABLET | Freq: Every day | ORAL | 1 refills | Status: DC

## 2022-11-13 NOTE — Telephone Encounter (Signed)
Will do, what strength?

## 2022-11-13 NOTE — Telephone Encounter (Signed)
Called and spoke with patient and wife and explained insurance company is requiring a change to Xarelto and his dose will be 15mg  once daily. Patient and wife voiced understanding.

## 2022-11-13 NOTE — Telephone Encounter (Signed)
Pharmacy Patient Advocate Encounter  Prior Authorization for Jorge Cherry has been approved.    Effective dates: UNTIL 05/11/23   Jorge Cherry, CPhT Pharmacy Patient Advocate Specialist Direct Number: [phone] Fax: [phone]

## 2022-11-13 NOTE — Telephone Encounter (Signed)
Per test claim drug is non form/plan benefit exclusion.   Can we change pt to Jorge Cherry? It's covered but will still need a PA since there's a DC flag on the rejection (disease contradiction flagged by plan due to presence of heart valve)

## 2022-11-13 NOTE — Telephone Encounter (Signed)
Patient's wife is requesting call back to discuss this medication and get update on refill. Please advise.

## 2022-11-13 NOTE — Telephone Encounter (Signed)
PA initiated, please see separate encounter for updates on determination. (I will route you back in once a decision has been made)  Alivia Cimino, CPhT Pharmacy Patient Advocate Specialist Direct Number: [phone] Fax: [phone]  

## 2022-11-13 NOTE — Telephone Encounter (Signed)
Pharmacy Patient Advocate Encounter   Received notification from [facility] that prior authorization for XARELTO is needed.    PA submitted on 11/13/22 Key [id number] Status is pending  Haze Rushing, CPhT Pharmacy Patient Advocate Specialist Direct Number: [phone] Fax: [phone]

## 2022-11-13 NOTE — Addendum Note (Signed)
Addended by: Cheree Ditto on: 11/13/2022 04:19 PM   Modules accepted: Orders

## 2022-11-16 NOTE — Telephone Encounter (Signed)
thx

## 2022-11-16 NOTE — Telephone Encounter (Signed)
Spoke with Thayer Ohm, and we switched patient to Xarelto, which is now approved. Wife is also aware. Patient should be good on medication now.

## 2022-11-19 ENCOUNTER — Ambulatory Visit (INDEPENDENT_AMBULATORY_CARE_PROVIDER_SITE_OTHER): Payer: Medicare PPO

## 2022-11-19 DIAGNOSIS — I442 Atrioventricular block, complete: Secondary | ICD-10-CM

## 2022-11-19 LAB — CUP PACEART REMOTE DEVICE CHECK
Battery Remaining Longevity: 132 mo
Battery Voltage: 3 V
Brady Statistic RA Percent Paced: 0 %
Brady Statistic RV Percent Paced: 32.54 %
Date Time Interrogation Session: 20240711013440
Implantable Lead Connection Status: 753985
Implantable Lead Connection Status: 753985
Implantable Lead Implant Date: 20210712
Implantable Lead Implant Date: 20210712
Implantable Lead Location: 753859
Implantable Lead Location: 753860
Implantable Lead Model: 5076
Implantable Lead Model: 5076
Implantable Pulse Generator Implant Date: 20210712
Lead Channel Impedance Value: 285 Ohm
Lead Channel Impedance Value: 361 Ohm
Lead Channel Impedance Value: 380 Ohm
Lead Channel Impedance Value: 418 Ohm
Lead Channel Pacing Threshold Amplitude: 0.625 V
Lead Channel Pacing Threshold Amplitude: 0.75 V
Lead Channel Pacing Threshold Pulse Width: 0.4 ms
Lead Channel Pacing Threshold Pulse Width: 0.4 ms
Lead Channel Sensing Intrinsic Amplitude: 0.875 mV
Lead Channel Sensing Intrinsic Amplitude: 0.875 mV
Lead Channel Sensing Intrinsic Amplitude: 11.5 mV
Lead Channel Sensing Intrinsic Amplitude: 11.5 mV
Lead Channel Setting Pacing Amplitude: 2.5 V
Lead Channel Setting Pacing Pulse Width: 0.4 ms
Lead Channel Setting Sensing Sensitivity: 0.9 mV
Zone Setting Status: 755011

## 2022-11-20 ENCOUNTER — Telehealth: Payer: Self-pay | Admitting: Cardiology

## 2022-11-20 NOTE — Telephone Encounter (Signed)
    Received page from answering service after hours requesting call back for clarification on medication timing. I called and spoke with both patient and his spouse who wanted clarification on timing of his transition from Eliquis to Xarelto. Patient has 2 remaining doses of Eliquis (one for tonight and one for tomorrow morning). Discussed that he can take both doses as scheduled and then proceed to begin Xarelto tomorrow night per transition guidelines. I did discuss with patient that during this transition, technically there will be extra "thinning" of blood and advised that he take extra precaution to avoid situations that could lead to falls/injury. Patient and spouse were very appreciative of the call back and confirmed understanding of this plan.  Perlie Gold, PA-C

## 2022-11-26 ENCOUNTER — Emergency Department
Admission: EM | Admit: 2022-11-26 | Discharge: 2022-11-26 | Disposition: A | Discharge status: Home / Self Care | Payer: Medicare PPO | Attending: Emergency Medicine | Admitting: Emergency Medicine

## 2022-11-26 ENCOUNTER — Telehealth: Payer: Self-pay | Admitting: Cardiovascular Disease

## 2022-11-26 ENCOUNTER — Emergency Department: Payer: Medicare PPO

## 2022-11-26 ENCOUNTER — Other Ambulatory Visit: Payer: Self-pay

## 2022-11-26 DIAGNOSIS — X58XXXA Exposure to other specified factors, initial encounter: Secondary | ICD-10-CM | POA: Diagnosis not present

## 2022-11-26 DIAGNOSIS — S8012XA Contusion of left lower leg, initial encounter: Secondary | ICD-10-CM | POA: Insufficient documentation

## 2022-11-26 DIAGNOSIS — M7989 Other specified soft tissue disorders: Secondary | ICD-10-CM | POA: Diagnosis not present

## 2022-11-26 DIAGNOSIS — Z7901 Long term (current) use of anticoagulants: Secondary | ICD-10-CM | POA: Diagnosis not present

## 2022-11-26 DIAGNOSIS — I130 Hypertensive heart and chronic kidney disease with heart failure and stage 1 through stage 4 chronic kidney disease, or unspecified chronic kidney disease: Secondary | ICD-10-CM | POA: Diagnosis not present

## 2022-11-26 DIAGNOSIS — E032 Hypothyroidism due to medicaments and other exogenous substances: Secondary | ICD-10-CM | POA: Diagnosis not present

## 2022-11-26 DIAGNOSIS — E785 Hyperlipidemia, unspecified: Secondary | ICD-10-CM | POA: Diagnosis not present

## 2022-11-26 NOTE — Discharge Instructions (Signed)
Please rest ice and elevate the left leg.  Apply ice over the area 20 minutes every hour.  Return for any increasing pain swelling or any other bruising throughout the body.

## 2022-11-26 NOTE — Telephone Encounter (Signed)
Spoke to the patient wife Dewayne Hatch [facility]), pt switched from Eliquis to Xarelto on 7/12 as of today, pt  c/o of both feet feeling cold since yesterday and one bruise  on the front of left leg. Pt denies fall or hit his leg. Pt wife will like if the symptoms side effects of Xarelto. Will forward to MD and Pharm D for advise.

## 2022-11-26 NOTE — ED Notes (Signed)
Rainbow was sent to lab. 

## 2022-11-26 NOTE — ED Triage Notes (Signed)
Patient presents with bruising and mild swelling to his LEFT leg; Was advised by his PCP's office to come to the E.D. for suspected DVT; Patient is currently bridging from Eliquis to Xarelto; Denies pain, denies numbness/tingling

## 2022-11-26 NOTE — Telephone Encounter (Signed)
Pt c/o medication issue:  1. Name of Medication:   Rivaroxaban (XARELTO) 15 MG TABS tablet    2. How are you currently taking this medication (dosage and times per day)?   3. Are you having a reaction (difficulty breathing--STAT)? yes  4. What is your medication issue? Patient's wife believes that this medication is causing bruising on patient and feet are staying cold. Requesting call back to discuss.

## 2022-11-26 NOTE — ED Notes (Signed)
Pt verbalizes her dog "provides the service of helping me stay calm".

## 2022-11-26 NOTE — ED Provider Notes (Signed)
EMERGENCY DEPARTMENT AT [facility] Provider Note   CSN: [id number] Arrival date & time: 11/26/22  1734     History  Chief Complaint  Patient presents with   Leg Swelling    Patient presents with bruising and mild swelling to his LEFT leg; Was advised by his PCP's office to come to the E.D. for suspected DVT; Patient is currently bridging from Eliquis to Xarelto; Denies pain, denies numbness/tingling    Jorge Cherry is a 87 y.o. male presents to the emergency department valuation of left leg swelling, bruising.  He has a focal bruise to the anterior left tibia.  Bruising noted today.  Was concerned about a DVT.  PCP told him to come to the ED for a ultrasound.  Patient is on Xarelto.  Recently started Xarelto because insurance would not cover Eliquis.  He denies any other bleeding or swelling throughout his body.  No chest pain or shortness of breath.  He has no pain or discomfort.  Patient did have blood work at PCPs office earlier today and everything seemed appear well with the CBC and BMP  HPI     Home Medications Prior to Admission medications   Medication Sig Start Date End Date Taking? Authorizing Provider  albuterol (VENTOLIN HFA) 108 (90 Base) MCG/ACT inhaler USE 2 INHALATIONS BY MOUTH EVERY 4 - 6 HOURS AS NEEDED 03/30/22   Kozlow, Alvira Philips, MD  amLODipine (NORVASC) 10 MG tablet Take 1 tablet (10 mg total) by mouth daily. 08/10/22   Duke Salvia, MD  atorvastatin (LIPITOR) 20 MG tablet Take 1 tablet (20 mg total) by mouth at bedtime. 03/16/18   Barnetta Chapel, MD  carvedilol (COREG) 3.125 MG tablet Take 1 tablet (3.125 mg total) by mouth 2 (two) times daily with a meal. 11/10/22   Lennette Bihari, MD  DYMISTA 137-50 MCG/ACT SUSP Place 1-2 sprays into both nostrils daily as needed. 04/22/21   [provider]  ezetimibe (ZETIA) 10 MG tablet TAKE 1 TABLET BY MOUTH EVERY DAY 08/07/22   Lennette Bihari, MD  fenofibrate (TRICOR) 145 MG tablet TAKE 1/2  TABLETS BY MOUTH DAILY 11/12/21   Lennette Bihari, MD  finasteride (PROSCAR) 5 MG tablet Take 5 mg by mouth daily.    [provider]  fluticasone (FLONASE) 50 MCG/ACT nasal spray Place 2 sprays into both nostrils daily as needed for allergies or rhinitis.     [provider]  furosemide (LASIX) 40 MG tablet Take 1 tablet (40 mg total) by mouth daily. 07/20/22   Lennette Bihari, MD  ipratropium (ATROVENT) 0.03 % nasal spray Place 2 sprays into both nostrils 2 (two) times daily. 03/04/21   Kozlow, Alvira Philips, MD  isosorbide mononitrate (IMDUR) 30 MG 24 hr tablet TAKE 1 TABLET BY MOUTH EVERY DAY 07/24/22   Lennette Bihari, MD  levothyroxine (SYNTHROID) 25 MCG tablet Take 25 mcg by mouth daily before breakfast.  07/08/19   [provider]  loratadine (CLARITIN) 10 MG tablet Take 1 tablet (10 mg total) by mouth 2 (two) times daily as needed for allergies (Can use an extra dose during flares). 03/04/21   Kozlow, Alvira Philips, MD  Multiple Vitamins-Minerals (CENTRUM SILVER 50+MEN) TABS Take 1 tablet by mouth at bedtime.    [provider]  nitroGLYCERIN (NITROLINGUAL) 0.4 MG/SPRAY spray Place 1 spray under the tongue every 5 (five) minutes x 3 doses as needed for chest pain. 08/16/22   Laurann Montana, PA-C  Omega-3 Fatty Acids (FISH OIL) 1000 MG CAPS Take 1,000 mg by mouth 2 (two) times daily.     [provider]  pantoprazole (PROTONIX) 40 MG tablet TAKE 1 TABLET BY MOUTH EVERY DAY 03/31/22   Kozlow, Alvira Philips, MD  potassium chloride SA (KLOR-CON M) 20 MEQ tablet Take 1 tablet (20 mEq total) by mouth daily. 09/10/22   Lennette Bihari, MD  ranolazine (RANEXA) 500 MG 12 hr tablet TAKE 1 TABLET BY MOUTH TWICE A DAY 09/02/22   Lennette Bihari, MD  Rivaroxaban (XARELTO) 15 MG TABS tablet Take 1 tablet (15 mg total) by mouth daily with supper. 11/13/22   Lennette Bihari, MD  vitamin C (ASCORBIC ACID) 500 MG tablet Take 500 mg by mouth at bedtime.     [provider]  vitamin E 400  UNIT capsule Take 400 Units by mouth daily.    [provider]  zinc gluconate 50 MG tablet Take 50 mg by mouth daily.    [provider]      Allergies    Ramipril, Guaifenesin er, Azithromycin, and Iodinated contrast media    Review of Systems   Review of Systems  Physical Exam Updated Vital Signs BP 127/71 (BP Location: Left Arm)   Pulse 79   Temp 98.3 F (36.8 C) (Oral)   Resp 18 Comment: resp even and unlabored.  Ht 5\' 8"  (1.727 m)   Wt 76.2 kg   SpO2 96%   BMI 25.54 kg/m  Physical Exam Constitutional:      Appearance: He is well-developed.  HENT:     Head: Normocephalic and atraumatic.  Eyes:     Conjunctiva/sclera: Conjunctivae normal.  Cardiovascular:     Rate and Rhythm: Normal rate.  Pulmonary:     Effort: Pulmonary effort is normal. No respiratory distress.  Musculoskeletal:        General: Normal range of motion.     Cervical back: Normal range of motion.     Comments: Left anterior tibia with a small 4 cm area of ecchymosis.  Nontender, nonfluctuant.  No warmth redness.  No tenderness throughout the calf.  Minimal swelling throughout both lower extremities with no significant pitting edema.  Neurovascular intact in bilateral lower extremities.  2+ dorsalis pedis pulse left leg.  Skin:    General: Skin is warm.     Findings: No rash.  Neurological:     Mental Status: He is alert and oriented to person, place, and time.  Psychiatric:        Behavior: Behavior normal.        Thought Content: Thought content normal.     ED Results / Procedures / Treatments   Labs (all labs ordered are listed, but only abnormal results are displayed) Labs Reviewed - No data to display  EKG None  Radiology US Venous Img Lower Unilateral Left  Result Date: 11/26/2022 CLINICAL DATA:  Left leg swelling EXAM: LEFT LOWER EXTREMITY VENOUS DOPPLER ULTRASOUND TECHNIQUE: Gray-scale sonography with compression, as well as color and duplex ultrasound, were  performed to evaluate the deep venous system(s) from the level of the common femoral vein through the popliteal and proximal calf veins. COMPARISON:  None Available. FINDINGS: VENOUS Normal compressibility of the common femoral, superficial femoral, and popliteal veins, as well as the visualized calf veins. Visualized portions of profunda femoral vein and great saphenous vein unremarkable. No filling defects to suggest DVT on grayscale or color Doppler imaging. Doppler waveforms show normal direction of venous  flow, normal respiratory plasticity and response to augmentation. Limited views of the contralateral common femoral vein are unremarkable. OTHER None. Limitations: none IMPRESSION: Negative. Electronically Signed   By: Helyn Numbers M.D.   On: 11/26/2022 20:05    Procedures Procedures    Medications Ordered in ED Medications - No data to display  ED Course/ Medical Decision Making/ A&P                             Medical Decision Making  87 year old male with small left anterior tibia hematoma.  Ultrasound negative for DVT.  Blood work within normal limits today, platelets normal.  Blood work reviewed from outside facility.  Patient discharged home in stable condition.  He appears well.  Vital signs are stable.  Patient understands signs symptoms return to the ER for. Final Clinical Impression(s) / ED Diagnoses Final diagnoses:  Leg swelling  Hematoma of left lower leg    Rx / DC Orders ED Discharge Orders     None         Ronnette Juniper 11/26/22 2130    Minna Antis, MD 11/27/22 1902

## 2022-11-26 NOTE — Telephone Encounter (Signed)
Patient came to office concerned over bruising. Triage advised going to Urgent care or ED. Advised pt 11/26/2022 J.O.

## 2022-11-27 NOTE — Telephone Encounter (Signed)
It is not abnormal to bruise of blood thinners. Feeling cold typically has to do with afib and not so much the medication, although its not uncommon for patients on blood thinners to complain they feel cold. Looks like patient was evaluated yesterday.

## 2022-12-01 DIAGNOSIS — I480 Paroxysmal atrial fibrillation: Secondary | ICD-10-CM | POA: Diagnosis not present

## 2022-12-01 DIAGNOSIS — R600 Localized edema: Secondary | ICD-10-CM | POA: Diagnosis not present

## 2022-12-01 DIAGNOSIS — L03116 Cellulitis of left lower limb: Secondary | ICD-10-CM | POA: Diagnosis not present

## 2022-12-01 DIAGNOSIS — I130 Hypertensive heart and chronic kidney disease with heart failure and stage 1 through stage 4 chronic kidney disease, or unspecified chronic kidney disease: Secondary | ICD-10-CM | POA: Diagnosis not present

## 2022-12-01 DIAGNOSIS — G3184 Mild cognitive impairment, so stated: Secondary | ICD-10-CM | POA: Diagnosis not present

## 2022-12-01 DIAGNOSIS — I5032 Chronic diastolic (congestive) heart failure: Secondary | ICD-10-CM | POA: Diagnosis not present

## 2022-12-02 ENCOUNTER — Telehealth: Payer: Self-pay | Admitting: Cardiovascular Disease

## 2022-12-02 NOTE — Telephone Encounter (Signed)
Patient wife discussed concerns and noted the ED visit and his visit with PCP.  He has been seen by both and stated cellulitis and bruising.  Ruled out DVT as well. She is to F/U with PCP if no improvement .  She will continue with those recommendations

## 2022-12-02 NOTE — Telephone Encounter (Signed)
Pt c/o medication issue:  1. Name of Medication:  Rivaroxaban (XARELTO) 15 MG TABS tablet  2. How are you currently taking this medication (dosage and times per day)?  Once daily after eating at night  3. Are you having a reaction (difficulty breathing--STAT)?   4. What is your medication issue?    Patient's wife states [employer] refused to cover Eliquis, so the patient was put on Xarelto. Patient's wife states patient has a bruise below his left knee and she assumes it is because his medications were switched. She states patient saw a PA at PCP office yesterday who suggested that the bruising is from a bug bite. Please advise.

## 2022-12-08 DIAGNOSIS — R600 Localized edema: Secondary | ICD-10-CM | POA: Diagnosis not present

## 2022-12-08 DIAGNOSIS — L03116 Cellulitis of left lower limb: Secondary | ICD-10-CM | POA: Diagnosis not present

## 2022-12-08 DIAGNOSIS — I480 Paroxysmal atrial fibrillation: Secondary | ICD-10-CM | POA: Diagnosis not present

## 2023-01-20 DIAGNOSIS — H903 Sensorineural hearing loss, bilateral: Secondary | ICD-10-CM | POA: Diagnosis not present

## 2023-02-02 ENCOUNTER — Encounter: Payer: Self-pay | Admitting: Cardiovascular Disease

## 2023-02-02 ENCOUNTER — Ambulatory Visit: Payer: Medicare PPO | Attending: Cardiovascular Disease | Admitting: Cardiovascular Disease

## 2023-02-02 VITALS — BP 136/86 | HR 76 | Ht 68.0 in | Wt 181.2 lb

## 2023-02-02 DIAGNOSIS — G4733 Obstructive sleep apnea (adult) (pediatric): Secondary | ICD-10-CM | POA: Diagnosis not present

## 2023-02-02 DIAGNOSIS — Z7901 Long term (current) use of anticoagulants: Secondary | ICD-10-CM | POA: Diagnosis not present

## 2023-02-02 DIAGNOSIS — I4819 Other persistent atrial fibrillation: Secondary | ICD-10-CM

## 2023-02-02 DIAGNOSIS — Z951 Presence of aortocoronary bypass graft: Secondary | ICD-10-CM | POA: Diagnosis not present

## 2023-02-02 DIAGNOSIS — I2510 Atherosclerotic heart disease of native coronary artery without angina pectoris: Secondary | ICD-10-CM | POA: Diagnosis not present

## 2023-02-02 DIAGNOSIS — Z950 Presence of cardiac pacemaker: Secondary | ICD-10-CM

## 2023-02-02 DIAGNOSIS — R600 Localized edema: Secondary | ICD-10-CM | POA: Diagnosis not present

## 2023-02-02 DIAGNOSIS — Z952 Presence of prosthetic heart valve: Secondary | ICD-10-CM

## 2023-02-02 NOTE — Progress Notes (Signed)
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 cm of water.  During that evaluation his continued edema I recommended he decrease amlodipine to 5 mg as he never started torsemide I increased furosemide from 20 to 40 mg.  Since I last saw him, he has done well from a cardiac perspective.  Undergoes remote device checks by Dr. Graciela Husbands and is stable battery status and lead measurements remained unchanged.  November 26, 2022 he presented to the emergency room with left leg swelling and bruising.  He continued to be on alto which was started in place of Eliquis due to insurance.  He underwent lower extremity Doppler study of his left leg which showed normal compressibility of the common femoral superficial femoral and popliteal veins as well as visualized calf veins.  There was no evidence for DVT.  Presently, he denies any significant chest pain.  2 weeks ago he had issues with hemorrhoidal bleeding.  He continues to use CPAP therapy.  Download from August 26 through February 02, 2023 showed significant mask leak with overall AHI at 3.2.  He was averaging 6 hours and 30 minutes of CPAP use per night.  Needs to be set up with a new DME company since [facility] is no longer in the business.  He is in need for new mask and in the office today I provided him with a new ResMed F20 mask which is the mask he uses at home.  He presents for evaluation.                     Past Medical History:  Diagnosis Date   Arthritis    "just a touch in my  hands" (11/24/2016)   Asthma    Atherosclerosis of renal artery (HCC)    RENAL DOPPLER, 12/10/2011 - Left renal artery demonstrated narrowing with elevated velocities consistent with a 1-59% diameter reduction   CKD (chronic kidney disease) stage 3, GFR 30-59 ml/min (HCC)    "stable now since they backed off the water pills" (11/24/2016)   Coronary artery disease    a. 1994 s/p CABG x 4 (LIMA-LAD, VG->D2, VG->OM, VG->RCA); b. 02/2004 PCI SVG-D2 (3.5x16 Taxus DES). VG->RCA 100. Sev apical LAD dzs distal to LIMA insertion; c. 11/2016 Cath: LAD 95p/1105m, D2 100ost, LCX 100ost, 70p/m, RCA 100p/m, RPDA fills via L->R collats. VG->RCA 100, VG->D2 20 ost, patent prox  stent, 61m, LIMA->LAD ok, VG->OM3 20p. EF 55%-->Med Rx.   GERD (gastroesophageal reflux disease)    High cholesterol    History of lower GI bleeding    a. 09/2014 GIB due to diverticulosis/diverticulitis.   Iron deficiency anemia    Labile Hypertension    Moderate aortic stenosis    a. 10/2011 Echo:  EF >55%, mild-mod TR, mild-mod AS, mod Ca2+ of AoV leaflets; b. 07/2016 Echo: EF 60-65%, no rwma, Gr1 DD, mod AS [(S) mean grad , peak grad . Valve area (VTI): 1.33cm^2, (Vmax) 1.44cm^2. Mild MR]; c. 02/2018 Echo: EF 55-60%, no rwma, GR1 DD, Mod AS [Peak Vel (S): 319cm/s, Mean grad (S) , peak grad (S) 43mmHg].   PAF (paroxysmal atrial fibrillation) (HCC)    a. CHA2DS2VASc = 4-->Eliquis.   S/P TAVR (transcatheter aortic valve replacement) 11/14/2019   s/p TAVR with a 26 mm Edwards S3U via the TF approach by Drs Clifton James & Bartle   Sleep apnea     Past Surgical History:  Procedure Laterality Date   CARDIAC CATHETERIZATION  02/27/2004   Coronary intervention and medical management   CARDIAC CATHETERIZATION  11/24/2016   CARDIOVERSION N/A 12/20/2019   Procedure: CARDIOVERSION;  Surgeon: Chilton Si, MD;  Location: [facility] ENDOSCOPY;  Service: Cardiovascular;  Laterality: N/A;   CARDIOVERSION N/A 02/05/2020   Procedure:  CARDIOVERSION;  Surgeon: Wendall Stade, MD;  Location: [facility] ENDOSCOPY;  Service: Cardiovascular;  Laterality: N/A;   CATARACT EXTRACTION W/ INTRAOCULAR LENS IMPLANT Left    CORONARY ANGIOPLASTY  1994 X 2   "before bypass surgery"   CORONARY ANGIOPLASTY WITH STENT PLACEMENT  03/04/2004   SVG supplying the diagonal vessel stented with a 3.5x28mm Taxus stent post dilated to 4.0 mm   CORONARY ARTERY BYPASS GRAFT  1994   "CABG X4"   INGUINAL HERNIA REPAIR Right    PACEMAKER IMPLANT N/A 11/20/2019   Procedure: PACEMAKER IMPLANT;  Surgeon: Duke Salvia, MD;  Location: [facility] INVASIVE CV LAB;  Service: Cardiovascular;  Laterality: N/A;   RIGHT HEART CATH AND CORONARY/GRAFT ANGIOGRAPHY N/A 11/24/2016   Procedure: Right Heart Cath and Coronary/Graft Angiography;  Surgeon: Lennette Bihari, MD;  Location: [facility] INVASIVE CV LAB;  Service: Cardiovascular;  Laterality: N/A;   RIGHT/LEFT HEART CATH AND CORONARY/GRAFT ANGIOGRAPHY N/A 10/18/2019   Procedure: RIGHT/LEFT HEART CATH AND CORONARY/GRAFT ANGIOGRAPHY;  Surgeon: Lennette Bihari, MD;  Location: [facility] INVASIVE CV LAB;  Service: Cardiovascular;  Laterality: N/A;   TEE WITHOUT CARDIOVERSION N/A 11/14/2019   Procedure: TRANSESOPHAGEAL ECHOCARDIOGRAM (TEE);  Surgeon: Kathleene Hazel, MD;  Location: [facility] INVASIVE CV LAB;  Service: Open Heart Surgery;  Laterality: N/A;   TONSILLECTOMY AND ADENOIDECTOMY     TRANSCATHETER AORTIC VALVE REPLACEMENT, TRANSFEMORAL N/A 11/14/2019   Procedure: TRANSCATHETER AORTIC VALVE REPLACEMENT, TRANSFEMORAL;  Surgeon: Kathleene Hazel, MD;  Location: [facility] INVASIVE CV LAB;  Service: Open Heart Surgery;  Laterality: N/A;    Allergies  Allergen Reactions   Ramipril Swelling and Other (See Comments)    Mouth swelling Other reaction(s): Tongue swelling?   Guaifenesin Er Hives, Swelling and Other (See Comments)    Mouth swelling Other reaction(s): swelling of tongue   Azithromycin     Other reaction(s): Does not work Other  reaction(s): Does not work   Iodinated [employer] Other (See Comments)    Made eyes change each time Other reaction(s): Unknown    Current Outpatient Medications  Medication Sig Dispense Refill   albuterol (VENTOLIN HFA) 108 (90 Base) MCG/ACT inhaler USE 2 INHALATIONS  BY MOUTH EVERY 4 - 6 HOURS AS NEEDED 18 g 0   atorvastatin (LIPITOR) 20 MG tablet Take 1 tablet (20 mg total) by mouth at bedtime. 30 tablet 0   carvedilol (COREG) 3.125 MG tablet Take 1 tablet (3.125 mg total) by mouth 2 (two) times daily with a meal. 180 tablet 3   DYMISTA 137-50 MCG/ACT SUSP Place 1-2 sprays into both nostrils daily as needed.     ezetimibe (ZETIA) 10 MG tablet TAKE 1 TABLET BY MOUTH EVERY DAY 90 tablet 3   finasteride (PROSCAR) 5 MG tablet Take 5 mg by mouth daily.     fluticasone (FLONASE) 50 MCG/ACT nasal spray Place 2 sprays into both nostrils daily as needed for allergies or rhinitis.      furosemide (LASIX) 40 MG tablet Take 1 tablet (40 mg total) by mouth daily. 90 tablet 3   ipratropium (ATROVENT) 0.03 % nasal spray Place 2 sprays into both nostrils 2 (two) times daily. 30 mL 11   isosorbide mononitrate (IMDUR) 30 MG 24 hr tablet TAKE 1 TABLET BY MOUTH EVERY DAY 90 tablet 3   levothyroxine (SYNTHROID) 25 MCG tablet Take 25 mcg by mouth daily before breakfast.      loratadine (CLARITIN) 10 MG tablet Take 1 tablet (10 mg total) by mouth 2 (two) times daily as needed for allergies (Can use an extra dose during flares). 60 tablet 11   Multiple Vitamins-Minerals (CENTRUM SILVER 50+MEN) TABS Take 1 tablet by mouth at bedtime.     nitroGLYCERIN (NITROLINGUAL) 0.4 MG/SPRAY spray Place 1 spray under the tongue every 5 (five) minutes x 3 doses as needed for chest pain. 12 g 0   Omega-3 Fatty Acids (FISH OIL) 1000 MG CAPS Take 1,000 mg by mouth 2 (two) times daily.      pantoprazole (PROTONIX) 40 MG tablet TAKE 1 TABLET BY MOUTH EVERY DAY 90 tablet 1   potassium chloride SA (KLOR-CON M) 20 MEQ tablet Take 1  tablet (20 mEq total) by mouth daily. 90 tablet 3   ranolazine (RANEXA) 500 MG 12 hr tablet TAKE 1 TABLET BY MOUTH TWICE A DAY 180 tablet 2   Rivaroxaban (XARELTO) 15 MG TABS tablet Take 1 tablet (15 mg total) by mouth daily with supper. 90 tablet 1   vitamin C (ASCORBIC ACID) 500 MG tablet Take 500 mg by mouth at bedtime.      vitamin E 400 UNIT capsule Take 400 Units by mouth daily.     zinc gluconate 50 MG tablet Take 50 mg by mouth daily.     amLODipine (NORVASC) 10 MG tablet TAKE 1 TABLET BY MOUTH EVERY DAY 30 tablet 0   fenofibrate (TRICOR) 145 MG tablet TAKE 1/2 TABLET BY MOUTH DAILY 45 tablet 3   No current facility-administered medications for this visit.    Socially he is married and has 2 children and 4 grandchildren. There is no tobacco alcohol use. Recently he was has been very active.  ROS General: Negative; No fevers, chills, or night sweats;  HEENT: He is hard of hearing; A new complaint is that of ringing in his ears; right eye diplopia; no sinus congestion, difficulty swallowing Pulmonary: Negative; No cough, wheezing, shortness of breath, hemoptysis Cardiovascular: See HPI GI: Positive for recent GI bleed secondary to diverticular disease GU: Negative; No dysuria, hematuria, or difficulty voiding Musculoskeletal: Negative; no myalgias, joint pain, or weakness Hematologic/Oncology: Negative; no easy bruising, bleeding Endocrine: Negative; no heat/cold intolerance; no diabetes Neuro: Visual changes following recent Skin:  Negative; No rashes or skin lesions Psychiatric: Negative; No behavioral problems, depression Sleep: Positive for OSA with history of snoring, daytime sleepiness, no bruxism, restless legs, hypnogognic hallucinations, no cataplexy Other comprehensive 14 point system review is negative.  PE BP 136/86   Pulse 76   Ht 5\' 8"  (1.727 m)   Wt 181 lb 3.2 oz (82.2 kg)   SpO2 96%   BMI 27.55 kg/m    Repeat blood pressure by me was 136/82  Wt Readings  from Last 3 Encounters:  02/02/23 181 lb 3.2 oz (82.2 kg)  11/26/22 168 lb (76.2 kg)  07/20/22 178 lb 9.6 oz (81 kg)   General: Alert, oriented, no distress.  Skin: normal turgor, no rashes, warm and dry HEENT: Normocephalic, atraumatic. Pupils equal round and reactive to light; sclera anicteric; extraocular muscles intact;  Nose without nasal septal hypertrophy Mouth/Parynx benign; Mallinpatti scale 3 Neck: No JVD, no carotid bruits; normal carotid upstroke Lungs: clear to ausculatation and percussion; no wheezing or rales Chest wall: without tenderness to palpitation Heart: PMI not displaced, RRR, s1 s2 normal, 1/6 systolic murmur, no diastolic murmur, no rubs, gallops, thrills, or heaves Abdomen: soft, nontender; no hepatosplenomehaly, BS+; abdominal aorta nontender and not dilated by palpation. Back: no CVA tenderness Pulses 2+ Musculoskeletal: full range of motion, normal strength, no joint deformities Extremities: 1+ pedal edema; clubbing cyanosis or edema, Homan's sign negative  Neurologic: grossly nonfocal; Cranial nerves grossly wnl Psychologic: Normal mood and affect    EKG Interpretation Date/Time:  Tuesday February 02 2023 14:00:52 EDT Ventricular Rate:  76 PR Interval:    QRS Duration:  206 QT Interval:  482 QTC Calculation: 542 R Axis:   -62  Text Interpretation: Ventricular-paced rhythm When compared with ECG of 28-Jun-2021 16:49, PREVIOUS ECG IS PRESENT Confirmed by Nicki Guadalajara ([id number]) on 02/02/2023 3:24:47 PM    July 20, 2022 ECG (independently read by me): Atrial fibrillation at 76, overriding pacemaker  April 14, 2022 ECG (independently read by me): V paced rhythm at 76   December 03, 2021 ECG (independently read by me): V paced rhythm   July 21, 2021 ECG (independently read by me): Sinus rhythm with frequent V pacing  March 19, 2021 ECG (independently read by me): V paced at 55. Underlying AF   November 05, 2020 ECG (independently read by me):  Atrial  fibrillation at 76, LVH with QRS widening, LAD;  March 3,2022 ECG (independently read by me): Atrial fibrillation at 64, QTc 480 msec  ECG (independently read by me): Atrial fibrillation at 62 with a competing junctional pacemaker, Left axis deviation, QTc 460 msec  March 2020 ECG (independently read by me): Sinus rhythm at 71 bpm.  First-degree block with 212 ms.  Right bundle branch block with repolarization changes.  Left anterior hemiblock.  January 26, 2018 ECG (independently read by me): Sinus bradycardia at 47 bpm with first-degree AV block.  PR interval 218 ms.  LVH with QRS widening.  Left axis deviation.  October 28, 2017 ECG (independently read by me): Atrial fibrillation with variable rate that seems to be more in the 50s to 60s but following a pause drops into the 40s  August 26, 2017 ECG (independently read by me): atrial fibrillation at 56 bpm.  Nonspecific ST changes.  QTc interval 476 ms.  February 2019 ECG (independently read by me): Atrial fibrillation at 71 bpm.  LVH by voltage in aVL.  Nonspecific T changes.  04/27/2017 ECG (independently read by me): Normal sinus rhythm at 60  bpm.  Nonspecific intraventricular block.  Anterior T-wave abnormality  03/23/2017 ECG (independently read by me): atrial fibrillation at 64 bpm.  Nonspecific interventricular block.  Nonspecific T wave abnormality.  02/17/2017 ECG (independently read by me): Atrial fibrillation at 72 bpm.  RV conduction delay.  QTc interval 453 ms.  01/29/2017 ECG (independently read by me): Atrial fibrillation at 56 bpm.  LVH by voltage criteria.  Nonspecific ST changes.  QTc interval 430 ms.  August 2018 ECG (independently read by me): Sinus bradycardia 59 bpm.  LVH by voltage.  QTc interval 469 ms.  No significant ST-T changes.  April 2018 ECG (independently read by me): Sinus rhythm at 61 bpm.  RV conduction delay.  LVH.  March 2018 ECG (independently read by me): Atrial fibrillation at 71 bpm.  RV conduction  delay.  QTc interval 456 ms.  Left axis deviation.  February 2018 ECG (independently read by me): Normal sinus rhythm at 60 bpm.  LVH.  QTc interval at 464 ms.  Nonspecific T abnormality  December 2017 ECG (independently read by me):  Sinus bradycardia 57 bpm.  LVH by voltage. No significant ST changes.  January 10, 2016 ECG (independently read by me): Normal sinus rhythm at 64 bpm.  LVH.  QTc interval 468 ms.  December 2016 ECG (independently read by me):  Sinus bradycardia 51 bpm.  No ectopy. QTc interval 429 ms.  LVH by voltage criteria in aVL.  September 2016 ECG (independently read by me):  Sinus bradycardia with first-degree AV block. RV conduction delay. No significant ST-T changes.   August 2016 ECG (independently read by me): Sinus bradycardia 57 bpm.  Mild RV conduction delay.  May 2016 ECG (independently read by me): Sinus bradycardia at 49 bpm with first-degree AV block with a PR interval 218 ms.  Right reticular conduction delay.  December 2015 ECG (independently read by me).  For these: Sinus bradycardia 57 bpm.  First-degree AV block with a PR interval at 224 ms.  No significant ST-T changes.  March 2015 ECG (independently read by me): Sinus rhythm at 59 beats per minute. Mild LVH by voltage criteria in aVL. Normal intervals.  01/05/2013 ECG: Normal sinus rhythm at 62. Mild RV conduction delay. PR interval 204 ms, QTc interval 452 ms.  LABS:    Latest Ref Rng & Units 05/20/2022    3:41 PM 04/15/2022    1:26 PM 12/03/2021    3:33 PM  BMP  Glucose 70 - 99 mg/dL 91  92  84   BUN 8 - 27 mg/dL 22  18  27    Creatinine 0.76 - 1.27 mg/dL 6.21  3.08  6.57   BUN/Creat Ratio 10 - 24 15   17    Sodium 134 - 144 mmol/L 142  139  144   Potassium 3.5 - 5.2 mmol/L 4.1  4.1  4.4   Chloride 96 - 106 mmol/L 108  110  108   CO2 20 - 29 mmol/L 19  21  20    Calcium 8.6 - 10.2 mg/dL 8.9  9.1  9.8       Latest Ref Rng & Units 04/15/2022    1:26 PM 06/28/2021    5:23 PM 11/07/2020   12:00  AM  Hepatic Function  Total Protein 6.5 - 8.1 g/dL 7.0  6.5  6.5   Albumin 3.5 - 5.0 g/dL 3.1  3.5  4.2   AST 15 - 41 U/L 30  25  24    ALT 0 - 44 U/L 30  22  18   Alk Phosphatase 38 - 126 U/L 43  43  62   Total Bilirubin 0.3 - 1.2 mg/dL 0.6  0.6  0.4       Latest Ref Rng & Units 05/20/2022    3:41 PM 04/15/2022    1:26 PM 12/03/2021    3:33 PM  CBC  WBC 3.4 - 10.8 x10E3/uL 5.4  7.9  6.6   Hemoglobin 13.0 - 17.7 g/dL 14.7  9.9  82.9   Hematocrit 37.5 - 51.0 % 32.0  31.6  37.3   Platelets 150 - 450 x10E3/uL 249  263  177    Lab Results  Component Value Date   MCV 87 05/20/2022   MCV 90.5 04/15/2022   MCV 94 12/03/2021   Lab Results  Component Value Date   TSH 1.671 06/29/2021   Lab Results  Component Value Date   HGBA1C 5.5 06/28/2021     Lipid Panel     Component Value Date/Time   CHOL 108 07/11/2021 0927   CHOL 137 04/09/2014 1115   TRIG 123 07/11/2021 0927   TRIG 182 (H) 04/09/2014 1115   HDL 38 (L) 07/11/2021 0927   HDL 32 (L) 04/09/2014 1115   CHOLHDL 2.8 07/11/2021 0927   CHOLHDL 3.2 06/28/2021 2349   VLDL 14 06/28/2021 2349   LDLCALC 48 07/11/2021 0927   LDLCALC 69 04/09/2014 1115     RADIOLOGY: No results found.  A CPAP download was obtained in the office with reference to his obstructive sleep apnea from August 18 through January 24, 2018. He is 100% compliant with usage days and averaging 6 hours and 35 minutes of usage daily.  At his 12 cm pressure, AHI is excellent at 0.9.  He does have a mask leak.  CPAP November 10 through April 18, 2018: Compliance 97% usage days and usage greater than 4 hours.  Average use 6 hours 46 minutes.  At 12 cm pressure, AHI 1.2/h.  CPAP download June 25, 2018 through July 24, 2018.  Usage 87%, usage greater than 4 hours 83%, average usage on days used 6 hours and 34 minutes.  AHI 1.2 8012 cm pressure.  A download was obtained in the office from May 2022 through November 04, 2020.  Usage days only 63%.  Average  usage is 6 hours 22 minutes.  AHI 5.0 with a auto minimum pressure 11 maximum pressure 16 with 95 percentile pressure 11.5.  There is mask leak.  A download from February 12 through July 21, 2021 showed 50% use although the patient was hospitalized during some of this time..  Average use 5 hours and 50 minutes.  There is significant mask leak contributing to increased AHI at 9.7.  A download from August 26 through February 02, 2020 showed usage days at 83% with average use at 6 hours 31 minutes.  At a pressure range of 11 to 16 cm, AHI is 3.2.  There is significant mask leak.  95 percentile pressure 12.0 maximum average pressure 12.6.   IMPRESSION:  1. CAD in native artery   2. Hx of CABG   3. Persistent atrial fibrillation (HCC)   4. OSA (obstructive sleep apnea)   5. S/P TAVR (transcatheter aortic valve replacement)   6. Pacemaker   7. Bilateral lower extremity edema   8. Anticoagulated    ASSESSMENT AND PLAN: Jorge Cherry is an 86 year-old  male who is status post CABG revascularization surgery in 1994.  Subsequently he underwent DES stenting to  the graft supplying the diagonal vessel in 2003.  In July 2017 he was hospitalized with atrial fibrillation in the setting of bradycardia and hypotension leading to medication adjustment  An echo Doppler study of 11/21/2015 showed an EF of 55-60%, mild aortic stenosis with a valve area of 1.75 mm per there was mild pulmonary hypertension. Subsequently he had complained of experiencing some episodes of chest tightness and his symptoms improved with further titration of nitrates as well as beta blocker therapy.  He developed significant anemia, which undoubtedly exacerbated his anginal symptomatology.  At catheterization in July 2018 although his LIMA to mid LAD and vein graft to diagonal vessel are patent the very proximal LAD had a 95% stenosis which supplies a diagonal vessel, which was not supplied by the grafts.  His native RCA and graft to RCA was  occluded and there is some collateralization to the distal RCA via the vein graft supplying the circumflex marginal vessel.  His anginal symptoms improved with stabilization of his hemoglobin.  Spironolactone was discontinued secondary to renal insufficiency.  Due to progressive aortic stenosis he underwent TAVR with 26 mm SAPIEN 3 valve on November 14, 2019 and due to subsequent prolonged pauses with syncope underwent dual chamber Medtronic permanent pacemaker by Dr. Graciela Husbands on November 20, 2019.  He has been on Eliquis therapy for persistent long-term atrial fibrillation.  He has had issues with lower extremity edema, and was hospitalized with suspected CVA in the setting of missed doses of Eliquis in February 2023.  He subsequently developed diplopia of his right eye and had worn prism glasses.  He has developed increasing creatinine greater than 1.5 and his Eliquis dose has been reduced to 2.5 mg twice a day.  Since his prior evaluation with me, he was transition to Xarelto from Eliquis due to cost.  His blood pressure today is stable on his current regimen of amlodipine 10 mg, carvedilol 3.125 mg twice a day, isosorbide 30 mg daily and he continues to be on ranolazine 500 mg twice a day.  He is not having any anginal symptomatology.  He has been taking furosemide 20 mg twice a day.  He continues to have leg swelling.  I have suggested he can take 40 mg of furosemide in the morning and depending upon swelling take 20 mg in the afternoon as needed.  He continues to be on levothyroxine for hypothyroidism.  He is on atorvastatin 20 mg, Zetia 10 mg and fenofibrate for mixed hyperlipidemia.  Reviewed his CPAP data.  He has significant mask leak and is in need for a new mask.  I provided him with a sample new ResMed F20 mask today.  Will need to transfer him to a new DME provider to have suggested he wear support stockings with his residual peripheral edema.  He continues to undergo device pacemaker checks with Dr. Graciela Husbands which  is stable.  I will see him in 6 months for reevaluation or sooner as needed.    Lennette Bihari, MD, [facility]  02/05/2023 4:57 PM

## 2023-02-02 NOTE — Patient Instructions (Signed)
Medication Instructions:  *If you need a refill on your cardiac medications before your next appointment, please call your pharmacy*   Lab Work: If you have labs (blood work) drawn today and your tests are completely normal, you will receive your results only by: MyChart Message (if you have MyChart) OR A paper copy in the mail If you have any lab test that is abnormal or we need to change your treatment, we will call you to review the results.   Follow-Up: At [facility], you and your health needs are our priority.  As part of our continuing mission to provide you with exceptional heart care, we have created designated Provider Care Teams.  These Care Teams include your primary Cardiologist (physician) and Advanced Practice Providers (APPs -  Physician Assistants and Nurse Practitioners) who all work together to provide you with the care you need, when you need it.  We recommend signing up for the patient portal called "MyChart".  Sign up information is provided on this After Visit Summary.  MyChart is used to connect with patients for Virtual Visits (Telemedicine).  Patients are able to view lab/test results, encounter notes, upcoming appointments, etc.  Non-urgent messages can be sent to your provider as well.   To learn more about what you can do with MyChart, go to [email or web address].    Your next appointment:   6 month(s)  Provider:   Nicki Guadalajara, MD

## 2023-02-04 ENCOUNTER — Other Ambulatory Visit: Payer: Self-pay | Admitting: Internal Medicine

## 2023-02-04 DIAGNOSIS — I10 Essential (primary) hypertension: Secondary | ICD-10-CM

## 2023-02-05 ENCOUNTER — Other Ambulatory Visit: Payer: Self-pay | Admitting: Cardiovascular Disease

## 2023-02-05 ENCOUNTER — Encounter: Payer: Self-pay | Admitting: Cardiovascular Disease

## 2023-02-18 ENCOUNTER — Ambulatory Visit (INDEPENDENT_AMBULATORY_CARE_PROVIDER_SITE_OTHER): Payer: Medicare PPO

## 2023-02-18 DIAGNOSIS — I442 Atrioventricular block, complete: Secondary | ICD-10-CM | POA: Diagnosis not present

## 2023-02-18 LAB — CUP PACEART REMOTE DEVICE CHECK
Battery Remaining Longevity: 130 mo
Battery Voltage: 3 V
Brady Statistic RA Percent Paced: 0 %
Brady Statistic RV Percent Paced: 31.36 %
Date Time Interrogation Session: 20241009232233
Implantable Lead Connection Status: 753985
Implantable Lead Connection Status: 753985
Implantable Lead Implant Date: 20210712
Implantable Lead Implant Date: 20210712
Implantable Lead Location: 753859
Implantable Lead Location: 753860
Implantable Lead Model: 5076
Implantable Lead Model: 5076
Implantable Pulse Generator Implant Date: 20210712
Lead Channel Impedance Value: 304 Ohm
Lead Channel Impedance Value: 342 Ohm
Lead Channel Impedance Value: 380 Ohm
Lead Channel Impedance Value: 437 Ohm
Lead Channel Pacing Threshold Amplitude: 0.625 V
Lead Channel Pacing Threshold Amplitude: 0.75 V
Lead Channel Pacing Threshold Pulse Width: 0.4 ms
Lead Channel Pacing Threshold Pulse Width: 0.4 ms
Lead Channel Sensing Intrinsic Amplitude: 0.75 mV
Lead Channel Sensing Intrinsic Amplitude: 0.75 mV
Lead Channel Sensing Intrinsic Amplitude: 9.875 mV
Lead Channel Sensing Intrinsic Amplitude: 9.875 mV
Lead Channel Setting Pacing Amplitude: 2.5 V
Lead Channel Setting Pacing Pulse Width: 0.4 ms
Lead Channel Setting Sensing Sensitivity: 0.9 mV
Zone Setting Status: 755011

## 2023-03-02 NOTE — Progress Notes (Signed)
Remote pacemaker transmission.   

## 2023-03-08 DIAGNOSIS — H903 Sensorineural hearing loss, bilateral: Secondary | ICD-10-CM | POA: Diagnosis not present

## 2023-03-10 ENCOUNTER — Other Ambulatory Visit: Payer: Self-pay | Admitting: Internal Medicine

## 2023-03-10 DIAGNOSIS — I10 Essential (primary) hypertension: Secondary | ICD-10-CM

## 2023-03-22 DIAGNOSIS — I5032 Chronic diastolic (congestive) heart failure: Secondary | ICD-10-CM | POA: Diagnosis not present

## 2023-03-22 DIAGNOSIS — N1831 Chronic kidney disease, stage 3a: Secondary | ICD-10-CM | POA: Diagnosis not present

## 2023-03-22 DIAGNOSIS — I130 Hypertensive heart and chronic kidney disease with heart failure and stage 1 through stage 4 chronic kidney disease, or unspecified chronic kidney disease: Secondary | ICD-10-CM | POA: Diagnosis not present

## 2023-03-22 DIAGNOSIS — E785 Hyperlipidemia, unspecified: Secondary | ICD-10-CM | POA: Diagnosis not present

## 2023-03-22 DIAGNOSIS — E032 Hypothyroidism due to medicaments and other exogenous substances: Secondary | ICD-10-CM | POA: Diagnosis not present

## 2023-03-22 DIAGNOSIS — D509 Iron deficiency anemia, unspecified: Secondary | ICD-10-CM | POA: Diagnosis not present

## 2023-03-29 DIAGNOSIS — D649 Anemia, unspecified: Secondary | ICD-10-CM | POA: Diagnosis not present

## 2023-03-29 DIAGNOSIS — Z1331 Encounter for screening for depression: Secondary | ICD-10-CM | POA: Diagnosis not present

## 2023-03-29 DIAGNOSIS — Z0000 Encounter for general adult medical examination without abnormal findings: Secondary | ICD-10-CM | POA: Diagnosis not present

## 2023-03-29 DIAGNOSIS — G301 Alzheimer's disease with late onset: Secondary | ICD-10-CM | POA: Diagnosis not present

## 2023-03-29 DIAGNOSIS — D692 Other nonthrombocytopenic purpura: Secondary | ICD-10-CM | POA: Diagnosis not present

## 2023-03-29 DIAGNOSIS — Z1339 Encounter for screening examination for other mental health and behavioral disorders: Secondary | ICD-10-CM | POA: Diagnosis not present

## 2023-03-29 DIAGNOSIS — R82998 Other abnormal findings in urine: Secondary | ICD-10-CM | POA: Diagnosis not present

## 2023-03-29 DIAGNOSIS — I5032 Chronic diastolic (congestive) heart failure: Secondary | ICD-10-CM | POA: Diagnosis not present

## 2023-03-29 DIAGNOSIS — F0280 Dementia in other diseases classified elsewhere without behavioral disturbance: Secondary | ICD-10-CM | POA: Diagnosis not present

## 2023-03-29 DIAGNOSIS — I480 Paroxysmal atrial fibrillation: Secondary | ICD-10-CM | POA: Diagnosis not present

## 2023-03-29 DIAGNOSIS — N1831 Chronic kidney disease, stage 3a: Secondary | ICD-10-CM | POA: Diagnosis not present

## 2023-03-29 DIAGNOSIS — Z23 Encounter for immunization: Secondary | ICD-10-CM | POA: Diagnosis not present

## 2023-03-29 DIAGNOSIS — I130 Hypertensive heart and chronic kidney disease with heart failure and stage 1 through stage 4 chronic kidney disease, or unspecified chronic kidney disease: Secondary | ICD-10-CM | POA: Diagnosis not present

## 2023-04-04 ENCOUNTER — Other Ambulatory Visit: Payer: Self-pay | Admitting: Cardiovascular Disease

## 2023-04-04 DIAGNOSIS — G4733 Obstructive sleep apnea (adult) (pediatric): Secondary | ICD-10-CM

## 2023-04-04 DIAGNOSIS — I4811 Longstanding persistent atrial fibrillation: Secondary | ICD-10-CM

## 2023-04-04 DIAGNOSIS — R600 Localized edema: Secondary | ICD-10-CM

## 2023-04-20 ENCOUNTER — Other Ambulatory Visit: Payer: Self-pay | Admitting: Cardiovascular Disease

## 2023-04-20 DIAGNOSIS — R600 Localized edema: Secondary | ICD-10-CM

## 2023-04-20 DIAGNOSIS — G4733 Obstructive sleep apnea (adult) (pediatric): Secondary | ICD-10-CM

## 2023-04-20 DIAGNOSIS — I4811 Longstanding persistent atrial fibrillation: Secondary | ICD-10-CM

## 2023-04-26 ENCOUNTER — Telehealth: Payer: Self-pay | Admitting: Cardiovascular Disease

## 2023-04-26 MED ORDER — FUROSEMIDE 20 MG PO TABS: 20.0000 mg | ORAL_TABLET | Freq: Two times a day (BID) | ORAL | 3 refills | Status: DC

## 2023-04-26 NOTE — Telephone Encounter (Signed)
Patient identification verified by 2 forms. Marilynn Rail, RN    Called and spoke to patients wife Dewayne Hatch  Informed Ann:  -Dr. Tresa Endo okay with updating prescription  -Rx for Lasix 20mg  BID sent to [facility] [address]  [place] verbalized understanding, no questions at this time

## 2023-04-26 NOTE — Telephone Encounter (Signed)
Pt's wife wants to speak with a nurse about the medication before it is called in. Please advise

## 2023-04-26 NOTE — Telephone Encounter (Signed)
Pt wants to switch back to taking Furosemide 20 mg daily, instead of Furosemide 40 mg daily. Please address

## 2023-04-26 NOTE — Telephone Encounter (Signed)
*  STAT* If patient is at the pharmacy, call can be transferred to refill team.   1. Which medications need to be refilled? (please list name of each medication and dose if known)   furosemide (LASIX) 40 MG tablet    2. Which pharmacy/location (including street and city if local pharmacy) is medication to be sent to? [id number] [place], [place] - [address] Phone: [phone]  Fax: [phone]   3. Do they need a 30 day or 90 day supply? 90  Pt wants to switch back to taking 20 mg instead of 40mg

## 2023-04-26 NOTE — Telephone Encounter (Signed)
Patient identification verified by 2 forms. Marilynn Rail, RN    Called and spoke to patient wife Farrell Ours states:    -currently lasix was ordered for 40mg  lasix   -patient would like prescription sent for Lasix 20mg  BID   -has never filled Rx from 07/20/22 for 40mg  daily   -would [facility] [address] [address] [place]  Informed Ann message RN will speak to Dr. Tresa Endo regarding request  Dewayne Hatch has no further questions at this time

## 2023-05-06 ENCOUNTER — Other Ambulatory Visit (HOSPITAL_COMMUNITY): Payer: Self-pay | Admitting: *Deleted

## 2023-05-06 DIAGNOSIS — L570 Actinic keratosis: Secondary | ICD-10-CM | POA: Diagnosis not present

## 2023-05-06 DIAGNOSIS — L308 Other specified dermatitis: Secondary | ICD-10-CM | POA: Diagnosis not present

## 2023-05-06 DIAGNOSIS — L821 Other seborrheic keratosis: Secondary | ICD-10-CM | POA: Diagnosis not present

## 2023-05-06 DIAGNOSIS — L814 Other melanin hyperpigmentation: Secondary | ICD-10-CM | POA: Diagnosis not present

## 2023-05-06 DIAGNOSIS — L720 Epidermal cyst: Secondary | ICD-10-CM | POA: Diagnosis not present

## 2023-05-10 ENCOUNTER — Encounter (HOSPITAL_COMMUNITY): Payer: Medicare PPO

## 2023-05-13 ENCOUNTER — Other Ambulatory Visit (HOSPITAL_COMMUNITY): Payer: Self-pay

## 2023-05-13 ENCOUNTER — Ambulatory Visit (HOSPITAL_COMMUNITY)
Admission: RE | Admit: 2023-05-13 | Discharge: 2023-05-13 | Disposition: A | Discharge status: Home / Self Care | Payer: Medicare PPO | Source: Ambulatory Visit | Attending: Internal Medicine | Admitting: Internal Medicine

## 2023-05-13 ENCOUNTER — Telehealth: Payer: Self-pay | Admitting: Pharmacy Technician

## 2023-05-13 DIAGNOSIS — D649 Anemia, unspecified: Secondary | ICD-10-CM | POA: Insufficient documentation

## 2023-05-13 MED ORDER — SODIUM CHLORIDE 0.9 % IV SOLN
510.0000 mg | INTRAVENOUS | Status: DC
  Administered 2023-05-13: 510 mg via INTRAVENOUS
  Filled 2023-05-13: qty 510

## 2023-05-13 NOTE — Telephone Encounter (Signed)
 Pharmacy Patient Advocate Encounter  Received notification from [employer] that Prior Authorization for Xarelto  15MG  tablets  has been APPROVED from 05/12/23 to 05/10/24. Ran test claim, Copay is $80.00 for 90 days and 40.00 for 30 days. This test claim was processed through [facility]- copay amounts may vary at other pharmacies due to pharmacy/plan contracts, or as the patient moves through the different stages of their insurance plan.   PA #/Case ID/Reference #: [id number]

## 2023-05-13 NOTE — Telephone Encounter (Signed)
 Pharmacy Patient Advocate Encounter   Received notification from CoverMyMeds that prior authorization for Xarelto  15MG  tablets is required/requested.   Insurance verification completed.   The patient is insured through [employer] .   Per test claim: PA required; PA submitted to above mentioned insurance via CoverMyMeds Key/confirmation #/EOC [id number] Status is pending

## 2023-05-16 ENCOUNTER — Other Ambulatory Visit: Payer: Self-pay | Admitting: Student

## 2023-05-16 DIAGNOSIS — I4821 Permanent atrial fibrillation: Secondary | ICD-10-CM

## 2023-05-16 MED ORDER — RIVAROXABAN 15 MG PO TABS: 15.0000 mg | ORAL_TABLET | Freq: Every day | ORAL | 2 refills | Status: DC

## 2023-05-18 ENCOUNTER — Encounter (HOSPITAL_COMMUNITY): Payer: Medicare PPO

## 2023-05-20 ENCOUNTER — Ambulatory Visit (INDEPENDENT_AMBULATORY_CARE_PROVIDER_SITE_OTHER): Payer: Medicare PPO

## 2023-05-20 ENCOUNTER — Ambulatory Visit (HOSPITAL_COMMUNITY)
Admission: RE | Admit: 2023-05-20 | Discharge: 2023-05-20 | Disposition: A | Discharge status: Home / Self Care | Payer: Medicare PPO | Source: Ambulatory Visit | Attending: Internal Medicine | Admitting: Internal Medicine

## 2023-05-20 DIAGNOSIS — D649 Anemia, unspecified: Secondary | ICD-10-CM | POA: Diagnosis not present

## 2023-05-20 DIAGNOSIS — I442 Atrioventricular block, complete: Secondary | ICD-10-CM

## 2023-05-20 LAB — CUP PACEART REMOTE DEVICE CHECK
Battery Remaining Longevity: 126 mo
Battery Voltage: 3 V
Brady Statistic RA Percent Paced: 0 %
Brady Statistic RV Percent Paced: 34.05 %
Date Time Interrogation Session: 20250109034736
Implantable Lead Connection Status: 753985
Implantable Lead Connection Status: 753985
Implantable Lead Implant Date: 20210712
Implantable Lead Implant Date: 20210712
Implantable Lead Location: 753859
Implantable Lead Location: 753860
Implantable Lead Model: 5076
Implantable Lead Model: 5076
Implantable Pulse Generator Implant Date: 20210712
Lead Channel Impedance Value: 285 Ohm
Lead Channel Impedance Value: 342 Ohm
Lead Channel Impedance Value: 361 Ohm
Lead Channel Impedance Value: 418 Ohm
Lead Channel Pacing Threshold Amplitude: 0.625 V
Lead Channel Pacing Threshold Amplitude: 0.875 V
Lead Channel Pacing Threshold Pulse Width: 0.4 ms
Lead Channel Pacing Threshold Pulse Width: 0.4 ms
Lead Channel Sensing Intrinsic Amplitude: 0.375 mV
Lead Channel Sensing Intrinsic Amplitude: 0.375 mV
Lead Channel Sensing Intrinsic Amplitude: 10.375 mV
Lead Channel Sensing Intrinsic Amplitude: 10.375 mV
Lead Channel Setting Pacing Amplitude: 2.5 V
Lead Channel Setting Pacing Pulse Width: 0.4 ms
Lead Channel Setting Sensing Sensitivity: 0.9 mV
Zone Setting Status: 755011

## 2023-05-20 MED ORDER — SODIUM CHLORIDE 0.9 % IV SOLN
510.0000 mg | INTRAVENOUS | Status: DC
  Administered 2023-05-20: 510 mg via INTRAVENOUS
  Filled 2023-05-20: qty 510

## 2023-05-27 DIAGNOSIS — N1831 Chronic kidney disease, stage 3a: Secondary | ICD-10-CM | POA: Diagnosis not present

## 2023-06-02 ENCOUNTER — Other Ambulatory Visit: Payer: Self-pay | Admitting: Physician Assistant

## 2023-06-02 ENCOUNTER — Other Ambulatory Visit: Payer: Self-pay | Admitting: Cardiovascular Disease

## 2023-06-02 DIAGNOSIS — I25708 Atherosclerosis of coronary artery bypass graft(s), unspecified, with other forms of angina pectoris: Secondary | ICD-10-CM

## 2023-06-30 NOTE — Progress Notes (Signed)
 Remote pacemaker transmission.

## 2023-07-07 ENCOUNTER — Encounter: Payer: Self-pay | Admitting: Internal Medicine

## 2023-07-21 DIAGNOSIS — H903 Sensorineural hearing loss, bilateral: Secondary | ICD-10-CM | POA: Diagnosis not present

## 2023-08-02 ENCOUNTER — Ambulatory Visit: Payer: Medicare PPO | Admitting: Cardiovascular Disease

## 2023-08-19 ENCOUNTER — Ambulatory Visit (INDEPENDENT_AMBULATORY_CARE_PROVIDER_SITE_OTHER): Payer: Medicare PPO

## 2023-08-19 ENCOUNTER — Telehealth: Payer: Self-pay | Admitting: Cardiovascular Disease

## 2023-08-19 DIAGNOSIS — I442 Atrioventricular block, complete: Secondary | ICD-10-CM | POA: Diagnosis not present

## 2023-08-19 NOTE — Telephone Encounter (Signed)
Returned call to patient's wife left message on personal voice mail to call back. 

## 2023-08-19 NOTE — Telephone Encounter (Signed)
 Patient's wife is returning call.

## 2023-08-19 NOTE — Telephone Encounter (Signed)
 Attempted to call patient, no answer left message requesting a call back.

## 2023-08-19 NOTE — Telephone Encounter (Signed)
 New Message:      Patient says his feet stay cold constantly.He is concerned about not getting the proper blood flow. Patient wonder if he needs to see Dr Tresa Endo before his appointment in June?

## 2023-08-20 LAB — CUP PACEART REMOTE DEVICE CHECK
Battery Remaining Longevity: 122 mo
Battery Voltage: 2.99 V
Brady Statistic RA Percent Paced: 0 %
Brady Statistic RV Percent Paced: 45.61 %
Date Time Interrogation Session: 20250410044015
Implantable Lead Connection Status: 753985
Implantable Lead Connection Status: 753985
Implantable Lead Implant Date: 20210712
Implantable Lead Implant Date: 20210712
Implantable Lead Location: 753859
Implantable Lead Location: 753860
Implantable Lead Model: 5076
Implantable Lead Model: 5076
Implantable Pulse Generator Implant Date: 20210712
Lead Channel Impedance Value: 285 Ohm
Lead Channel Impedance Value: 361 Ohm
Lead Channel Impedance Value: 361 Ohm
Lead Channel Impedance Value: 399 Ohm
Lead Channel Pacing Threshold Amplitude: 0.625 V
Lead Channel Pacing Threshold Amplitude: 0.75 V
Lead Channel Pacing Threshold Pulse Width: 0.4 ms
Lead Channel Pacing Threshold Pulse Width: 0.4 ms
Lead Channel Sensing Intrinsic Amplitude: 0.75 mV
Lead Channel Sensing Intrinsic Amplitude: 0.75 mV
Lead Channel Sensing Intrinsic Amplitude: 10.125 mV
Lead Channel Sensing Intrinsic Amplitude: 10.125 mV
Lead Channel Setting Pacing Amplitude: 2.5 V
Lead Channel Setting Pacing Pulse Width: 0.4 ms
Lead Channel Setting Sensing Sensitivity: 0.9 mV
Zone Setting Status: 755011

## 2023-08-23 NOTE — Telephone Encounter (Signed)
 Patient identification verified by 2 forms. Hilton Lucky, RN    Received call from patients wife Brandy Cal states:   -patients feet are cold   -concerned about possible blockages   -has some swelling in right leg   -he has new numbness and tingling around his feet   -symptoms started 2 weeks ago  Lorelee Roger denies:   -discoloration in legs  Patient scheduled for OV 4/21 at 11:20am with [facility] in [place]  Reviewed ED warning signs/precautions Patient and Lorelee Roger verbalized understanding, no questions at this time

## 2023-08-23 NOTE — Telephone Encounter (Signed)
 2nd attempt to call patient, no answer left message requesting a call back.

## 2023-08-28 NOTE — Progress Notes (Unsigned)
 Cardiology Clinic Note   Date: 08/30/2023 ID: Jorge Cherry, DOB 03/27/1936, MRN [id number]  [facility] Providers Cardiologist:  Magnus Schuller, MD Electrophysiologist:  Richardo [name], MD {  Chief Complaint   Jorge Cherry is a 88 y.o. male who presents to the clinic today for evaluation of lower extremities.   Patient Profile   Jorge Cherry is followed by Dr. Loetta Ringer and Dr. Rodolfo Clan for the history outlined below.      Past medical history significant for: CAD. CABG times 11 Sep 1992: LIMA to LAD, SVG to diagonal, SVG to OM, SVG to RCA. LHC 02/27/2004: Severe native CAD with diffuse 70-80 proximal LAD stenosis, 70% proximal and 80% mid stenosis and large diagonal arising from proximal LAD, 90% LAD stenosis total occlusion of native LCx, 80% proximal followed by total occlusion of RCA.  Patent LIMA to the LAD with evidence of 60 to 70% smooth narrowing beyond the anastomosis site with 99/100% occlusion of proximal to the apical portion of the LAD.  Patent SVG to OM.  High-grade 95% very proximal stenosis in SVG to diagonal.  Patent SVG to RCA. Staged PCI 03/04/2004: PTCA/stenting SVG to diagonal. [facility] 11/24/2016: Supravalvular aortography demonstrating upper normal aortic root with mild aortic valve calcification with moderately reduced excursion.  No angiographic aortic insufficiency.  Severe native CAD with 95% proximal LAD stenosis just prior to the first diagonal vessel with total occlusion of the LAD after the third septal perforating artery and before takeoff of the second diagonal branch; total occlusion of the the LCx at the ostium; total proximal RCA occlusion.  Patent LIMA to mid LAD with total occlusion of LAD after the septal perforating artery, the proximal LAD and diagonal vessels not supplied by the graft.  Patent SVG to distal marginal with filling retrograde of the circumflex up to the ostium with 70% mid AV groove stenosis, there is collateral filling to the distal RCA.   Patent SVG to D2 with 20% ostial narrowing, a patent proximal stent, diffuse 40% mid graft stenosis.  There is 30% narrowing after the graft anastomosis.  Occluded SVG which supplied the distal RCA.  Plan to increase medical therapy. Chronic diastolic heart failure.  Echo 06/29/2021: EF 50 to 55%.  No RWMA.  Moderate LVH.  Indeterminate diastolic parameters.  Mildly reduced RV function, normal RV size.  Normal PA pressure, RVSP 35.8 mmHg.  Mild to moderate LAE.  Mild to moderate MR.  Mild AI.  Aortic valve sclerosis without stenosis, mean gradient 7 mmHg.  Bubble study negative for interatrial shunt. Severe aortic stenosis. R/LHC 10/18/2019: Severe aortic stenosis with mean transvalvular aortic gradient 41. TAVR 11/14/2019. Permanent A-fib. Onset July 2017. DCCV 12/20/2019, 02/05/2020. Complete heart block. PPM placement 11/20/2019. Remote device check 08/19/2023: Normal device function.  100% A-fib burden. Carotid artery stenosis Carotid ultrasound 11/10/2019: Right ICA 1 to 39%, left ICA 40 to 59%. Hypertension. Hyperlipidemia. CVA. Asthma. GERD. Hypothyroidism CKD stage III. OSA. On CPAP.  In summary, patient has a history of CAD dating back to 26 when he underwent CABG.  In October 2005 he required stenting to SVG to diagonal.  In July 2018 medical therapy was increased for CAD based on findings of R/LHC as detailed above.  He has a history of aortic stenosis with TAVR in July 2021.  In July 2017 he underwent hospital admission was found to be in A-fib with slow ventricular rate.  He spontaneously converted to sinus rhythm and was started on Eliquis .  Echo  at that time showed EF 55 to 60%, no RWMA, normal diastolic parameters, mild aortic valve stenosis, mild MR.  R/LHC with no angiographic aortic insufficiency.  Echo was updated in April 2021 and demonstrated normal LV/RV function, Grade II DD, moderate to severe aortic stenosis with mean gradient 35 mmHg.  Patient underwent R/LHC which showed  severe aortic stenosis with mean gradient 41 mmHg.  He underwent TAVR on 11/14/2019 and was discharged from the hospital the following day.  Postprocedure he was noted to be in A-fib and a Zio patch was placed.  On 11/19/2019 patient had a syncopal episode after eating dinner out.  He was getting out of his truck when he became dizzy and had a syncopal episode striking the right side of his head along the right upper step of his truck.  Review of the Zio patch showed multiple pauses lasting 8 to 11 seconds.  Telemetry demonstrated A-fib with slow ventricular rate with bundle branch block.  He underwent permanent pacemaker placement on 11/20/2019.  He was discharged on amiodarone  and Eliquis .  He underwent DCCV in August and September 2021 but only maintained sinus rhythm for about 24 hours.  Decision was made to pursue rate control.  Amiodarone  was discontinued.  Patient was admitted to the hospital in February 2023 for vision changes.  Initial head imaging was negative and repeat head CT 48 hours later was also negative.  CTA was negative for large vessel occlusion but did show eccentric bulky calcified plaque with associated stenosis 50 to 65% left ICA and atheromatous change causing associated stenosis 40% right ICA.  Echo during admission showed normal LV function and negative bubble study as detailed above.  Patient was last seen in the office by Dr. Loetta Ringer on 02/02/2023 for routine follow-up.  He was doing well at that time.  He had been transitioned from Eliquis  to Xarelto  secondary to insurance coverage and was tolerating the Xarelto  well.  Download of CPAP machine showed excellent adherence but a significant mask leak.  He was provided with a new mask with plan to set him up with new DME company.  No other changes were made.     History of Present Illness    Today, patient is accompanied by his wife. He reports noticing numbness in his lower legs and his feel and lower legs feel colder than his upper legs.  Patient denies shortness of breath, dyspnea on exertion, orthopnea or PND. He reports chest pain with heavy exertion but none with routine activities. He occasionally takes prn NTG for this but sometimes takes it "just in case" and not because he thinks he really needs it.  No palpitations.      ROS: All other systems reviewed and are otherwise negative except as noted in History of Present Illness.  EKGs/Labs Reviewed    EKG Interpretation Date/Time:  Monday August 30 2023 11:55:24 EDT Ventricular Rate:  79 PR Interval:    QRS Duration:  146 QT Interval:  414 QTC Calculation: 474 R Axis:   -30  Text Interpretation: Atrial fibrillation Left axis deviation Non-specific intra-ventricular conduction block Minimal voltage criteria for LVH, may be normal variant ( Cornell product ) When compared with ECG of 02-Feb-2023 14:00, Atrial fibrillation has replaced Electronic ventricular pacemaker Confirmed by Morey Ar [id number]) on 08/30/2023 12:00:04 PM    Risk Assessment/Calculations     CHA2DS2-VASc Score = 5   This indicates a 7.2% annual risk of stroke. The patient's score is based upon: CHF History: 1 HTN  History: 1 Diabetes History: 0 Stroke History: 0 Vascular Disease History: 1 Age Score: 2 Gender Score: 0             Physical Exam    VS:  BP 136/76   Pulse 79   Ht 5\' 8"  (1.727 m)   Wt 183 lb 6.4 oz (83.2 kg)   SpO2 96%   BMI 27.89 kg/m  , BMI Body mass index is 27.89 kg/m.  GEN: Well nourished, well developed, in no acute distress. Neck: No JVD or carotid bruits. Cardiac: Irregular rhythm. 2/6 systolic murmur. No rubs or gallops.   Respiratory:  Respirations regular and unlabored. Clear to auscultation without rales, wheezing or rhonchi. GI: Soft, nontender, nondistended. Extremities: Radials/DP 2+ and equal bilaterally. Nonpalpable PT pulses bilaterally.  No clubbing or cyanosis. Mild R>L nonpitting lower extremity edema.   Skin: Warm and dry, no  rash. Neuro: Strength intact.  Assessment & Plan   CAD S/p CABG x 11 Sep 1992, stenting SVG to diagonal October 2005.  LHC July 2021 showed multivessel obstructive disease with plan for continuation of medical therapy.  Patient reports stable chest pain for years that occurs with heavier exertion, none with routine activities. He takes an occasional prn SL NTG. Last time was last week after doing a lot of yard work.  - Continue amlodipine , carvedilol , atorvastatin , ezetimibe , Tricor , isosorbide , Ranexa , as needed SL NTG.  Patient not on aspirin  secondary to Xarelto .  Chronic HFpEF Echo February 2023 showed normal LV function, mildly reduced RV function, normal RV size, moderate LVH, indeterminate diastolic parameters, normal PA pressure, mild to moderate LAE, mild to moderate MR, mild AI.  Patient reports stable R>L lower extremity edema he manages with compression socks. No orthopnea or PND. Euvolemic and well compensated on exam. -Continue carvedilol , isosorbide , Lasix .Aaron Aas  Aortic stenosis S/p TAVR July 2021.  Patient reports stable lower extremity edema. No shortness of breath or DOE. 2/6 systolic murmur.  - Obtain echo as clinically indicated.  Complete heart block S/p PPM July 2021.  Remote device check April 2025 showed normal device function.  Patient denies lightheadedness, dizziness, presyncope, syncope. - Continue to follow with EP.  Permanent A-fib Onset July 2017.  S/p DCCV August and September 2021.  Device check April 2025 showed 100% A-fib burden.  Denies spontaneous bleeding concerns.  Patient denies cardiac awareness of arrhythmia. EKG demonstrates afib 79 bpm.  - Continue carvedilol  and Xarelto .  Hypertension BP today 136/76. No report of headaches or dizziness.  - Continue amlodipine , carvedilol , isosorbide .  Temperature changes/numbness lower extremities Patient reports several week history of temperature difference in lower legs. He reports numbness along the lateral  part of his lower legs and bilateral feet and lower legs feel colder to touch than upper leg. Patient does not have diabetes. He has spinal issues but has not experienced these symptoms before. He denies claudication.  His legs do not feel cool to touch on exam today. DP pulses 2+ and equal. Nonpalpable PT pulses.  -Schedule bilateral ABI and LEA US .   Disposition: Schedule bilateral ABI and LEA US . Return for previously scheduled visit with Dr. Loetta Ringer on 6/4 or sooner as needed.          Signed, Lonell Rives. Chace Klippel, DNP, NP-C

## 2023-08-30 ENCOUNTER — Encounter: Payer: Self-pay | Admitting: Student

## 2023-08-30 ENCOUNTER — Ambulatory Visit: Attending: Student | Admitting: Student

## 2023-08-30 VITALS — BP 136/76 | HR 79 | Ht 68.0 in | Wt 183.4 lb

## 2023-08-30 DIAGNOSIS — I25810 Atherosclerosis of coronary artery bypass graft(s) without angina pectoris: Secondary | ICD-10-CM

## 2023-08-30 DIAGNOSIS — I442 Atrioventricular block, complete: Secondary | ICD-10-CM | POA: Diagnosis not present

## 2023-08-30 DIAGNOSIS — I10 Essential (primary) hypertension: Secondary | ICD-10-CM

## 2023-08-30 DIAGNOSIS — I5032 Chronic diastolic (congestive) heart failure: Secondary | ICD-10-CM | POA: Diagnosis not present

## 2023-08-30 DIAGNOSIS — R200 Anesthesia of skin: Secondary | ICD-10-CM

## 2023-08-30 DIAGNOSIS — R208 Other disturbances of skin sensation: Secondary | ICD-10-CM

## 2023-08-30 NOTE — Patient Instructions (Addendum)
 Medication Instructions:  Your Physician recommend you continue on your current medication as directed.    *If you need a refill on your cardiac medications before your next appointment, please call your pharmacy*   Testing/Procedures: Your physician has requested that you have an ankle brachial index (ABI). During this test an ultrasound and blood pressure cuff are used to evaluate the arteries that supply the arms and legs with blood. Allow thirty minutes for this exam. There are no restrictions or special instructions.  Please note: We ask at that you not bring children with you during ultrasound (echo/ vascular) testing. Due to room size and safety concerns, children are not allowed in the ultrasound rooms during exams. Our front office staff cannot provide observation of children in our lobby area while testing is being conducted. An adult accompanying a patient to their appointment will only be allowed in the ultrasound room at the discretion of the ultrasound technician under special circumstances. We apologize for any inconvenience.   Your physician has requested that you have a lower extremity arterial duplex. During this test, ultrasound is used to evaluate arterial blood flow in the legs. Allow one hour for this exam. There are no restrictions or special instructions. This will take place at [address] [facility] [address], [place] [address]  Please note: We ask at that you not bring children with you during ultrasound (echo/ vascular) testing. Due to room size and safety concerns, children are not allowed in the ultrasound rooms during exams. Our front office staff cannot provide observation of children in our lobby area while testing is being conducted. An adult accompanying a patient to their appointment will only be allowed in the ultrasound room at the discretion of the ultrasound technician under special circumstances. We apologize for any  inconvenience.   Follow-Up: At [facility], you and your health needs are our priority.  As part of our continuing mission to provide you with exceptional heart care, our providers are all part of one team.  This team includes your primary Cardiologist (physician) and Advanced Practice Providers or APPs (Physician Assistants and Nurse Practitioners) who all work together to provide you with the care you need, when you need it.  Your next appointment:    10/13/2023 at 1:20 with Dr. Loetta Ringer

## 2023-09-02 ENCOUNTER — Encounter: Payer: Self-pay | Admitting: Internal Medicine

## 2023-09-04 ENCOUNTER — Other Ambulatory Visit: Payer: Self-pay | Admitting: Home Health

## 2023-09-04 ENCOUNTER — Telehealth: Payer: Self-pay | Admitting: Home Health

## 2023-09-04 MED ORDER — EZETIMIBE 10 MG PO TABS: 10.0000 mg | ORAL_TABLET | Freq: Every day | ORAL | 1 refills | Status: DC

## 2023-09-04 NOTE — Progress Notes (Signed)
 Patient's wife called after hour line, requesting refill of his Zetia  10mg  daily. [facility]

## 2023-09-04 NOTE — Telephone Encounter (Signed)
 Wife called after hour line today, request patient's Zetia  to be refilled. Unable to E-prescribe. [facility]'s wife request.

## 2023-09-09 ENCOUNTER — Other Ambulatory Visit: Payer: Self-pay | Admitting: Student

## 2023-09-09 DIAGNOSIS — I5032 Chronic diastolic (congestive) heart failure: Secondary | ICD-10-CM

## 2023-09-09 DIAGNOSIS — R200 Anesthesia of skin: Secondary | ICD-10-CM

## 2023-09-09 DIAGNOSIS — I25810 Atherosclerosis of coronary artery bypass graft(s) without angina pectoris: Secondary | ICD-10-CM

## 2023-09-09 DIAGNOSIS — R208 Other disturbances of skin sensation: Secondary | ICD-10-CM

## 2023-09-09 DIAGNOSIS — I442 Atrioventricular block, complete: Secondary | ICD-10-CM

## 2023-09-09 DIAGNOSIS — I10 Essential (primary) hypertension: Secondary | ICD-10-CM

## 2023-09-18 ENCOUNTER — Other Ambulatory Visit: Payer: Self-pay | Admitting: Cardiovascular Disease

## 2023-09-18 DIAGNOSIS — I10 Essential (primary) hypertension: Secondary | ICD-10-CM

## 2023-09-23 ENCOUNTER — Other Ambulatory Visit: Payer: Self-pay | Admitting: Cardiovascular Disease

## 2023-09-23 ENCOUNTER — Ambulatory Visit (HOSPITAL_COMMUNITY)
Admission: RE | Admit: 2023-09-23 | Discharge: 2023-09-23 | Disposition: A | Discharge status: Home / Self Care | Source: Ambulatory Visit | Attending: Student | Admitting: Student

## 2023-09-23 ENCOUNTER — Ambulatory Visit (HOSPITAL_COMMUNITY)
Admission: RE | Admit: 2023-09-23 | Discharge: 2023-09-23 | Disposition: A | Discharge status: Home / Self Care | Source: Ambulatory Visit | Attending: Student

## 2023-09-23 DIAGNOSIS — R200 Anesthesia of skin: Secondary | ICD-10-CM

## 2023-09-23 DIAGNOSIS — R208 Other disturbances of skin sensation: Secondary | ICD-10-CM | POA: Insufficient documentation

## 2023-09-25 ENCOUNTER — Ambulatory Visit: Payer: Self-pay | Admitting: Student

## 2023-09-25 LAB — VAS US ABI WITH/WO TBI

## 2023-10-01 DIAGNOSIS — J302 Other seasonal allergic rhinitis: Secondary | ICD-10-CM | POA: Diagnosis not present

## 2023-10-01 DIAGNOSIS — H903 Sensorineural hearing loss, bilateral: Secondary | ICD-10-CM | POA: Diagnosis not present

## 2023-10-01 DIAGNOSIS — H6123 Impacted cerumen, bilateral: Secondary | ICD-10-CM | POA: Diagnosis not present

## 2023-10-01 NOTE — Progress Notes (Signed)
 Remote pacemaker transmission.

## 2023-10-01 NOTE — Addendum Note (Signed)
 Addended by: Lott Rouleau A on: 10/01/2023 09:48 AM   Modules accepted: Orders

## 2023-10-07 NOTE — Progress Notes (Signed)
 Letter sent on 10/07/2023

## 2023-10-13 ENCOUNTER — Ambulatory Visit: Attending: Cardiovascular Disease | Admitting: Cardiovascular Disease

## 2023-10-13 ENCOUNTER — Encounter: Payer: Self-pay | Admitting: Cardiovascular Disease

## 2023-10-13 VITALS — BP 110/60 | HR 70 | Ht 68.0 in | Wt 183.0 lb

## 2023-10-13 DIAGNOSIS — I5032 Chronic diastolic (congestive) heart failure: Secondary | ICD-10-CM

## 2023-10-13 DIAGNOSIS — Z951 Presence of aortocoronary bypass graft: Secondary | ICD-10-CM | POA: Diagnosis not present

## 2023-10-13 DIAGNOSIS — Z950 Presence of cardiac pacemaker: Secondary | ICD-10-CM

## 2023-10-13 DIAGNOSIS — Z7901 Long term (current) use of anticoagulants: Secondary | ICD-10-CM

## 2023-10-13 DIAGNOSIS — Z79899 Other long term (current) drug therapy: Secondary | ICD-10-CM | POA: Diagnosis not present

## 2023-10-13 DIAGNOSIS — Z952 Presence of prosthetic heart valve: Secondary | ICD-10-CM | POA: Diagnosis not present

## 2023-10-13 DIAGNOSIS — I25810 Atherosclerosis of coronary artery bypass graft(s) without angina pectoris: Secondary | ICD-10-CM | POA: Diagnosis not present

## 2023-10-13 DIAGNOSIS — I10 Essential (primary) hypertension: Secondary | ICD-10-CM

## 2023-10-13 DIAGNOSIS — G4733 Obstructive sleep apnea (adult) (pediatric): Secondary | ICD-10-CM | POA: Diagnosis not present

## 2023-10-13 DIAGNOSIS — I4821 Permanent atrial fibrillation: Secondary | ICD-10-CM

## 2023-10-13 MED ORDER — CARVEDILOL 3.125 MG PO TABS
9.3750 mg | ORAL_TABLET | Freq: Every day | ORAL | 3 refills | Status: DC
Start: 2023-10-13 — End: 2024-01-28

## 2023-10-13 NOTE — Progress Notes (Signed)
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 95th percentile pressure is 11 cm of water.  During that evaluation his continued edema I recommended he decrease amlodipine  to 5 mg as he never started torsemide  I increased furosemide  from 20 to 40 mg.  I last saw him on February 02, 2019 for at which time he was stable from a cardiac perspective.  He was undergoing  remote device checks by Dr. Rodolfo Clan and with stable battery status and lead measurements remained unchanged.  On November 26, 2022 he presented to the emergency room with left leg swelling and bruising.  He continued to be on alto which was started in place of Eliquis  due to insurance.  He underwent lower extremity Doppler study of his left leg which showed normal compressibility of the common femoral superficial femoral and popliteal veins as well as visualized calf veins.  There was no evidence for DVT.  Presently, he denies any significant chest pain.  2 weeks ago he had issues with hemorrhoidal bleeding.  He continues to use CPAP therapy.  Download from August 26 through February 02, 2023 showed significant mask leak with overall AHI at 3.2.  He was averaging 6 hours and 30 minutes of CPAP use per night.  Needs to be set up with a new DME company since [facility] is no longer in the business.  He is in need for new mask and in the office today I provided him with a new ResMed F20 mask which is the mask he uses at home.  He presents for evaluation.         Since I last saw him, Mr. Rickey has continued to  do well.  He has continued to be on amlodipine  10 mg, carvedilol  3.125 mg twice daily, and isosorbide  30 mg in addition to ranolazine  500 mg twice a day for hypertension and CAD.  He is on atorvastatin  20 mg, Zetia  10 mg, fenofibrate  145 mg in addition to over-the-counter omega-3 fatty acid for lipid management.  He is on low-dose levothyroxine  for hypothyroidism.  He continues to be anticoagulated on Xarelto  at 15 mg daily.  He drove here today from [place] for this visit.  He sees Dr. Avva at [facility] for primary care who checks laboratory.  He continues to be on CPAP therapy.  A download from May 5 through October 12, 2023 shows good compliance with average use  at 6 and half hours per night.  His CPAP is set at a pressure range of 11 to 16 cm of water.  AHI is 3.2.  95th percentile pressure is 10.9.  He previously had [facility] and will be transition to [employer] for his DME.  He presents for follow-up evaluation.           Past Medical History:  Diagnosis Date   Arthritis    "just a touch in my hands" (11/24/2016)   Asthma    Atherosclerosis of renal artery (HCC)    RENAL DOPPLER, 12/10/2011 - Left renal artery demonstrated narrowing with elevated velocities consistent with a 1-59% diameter reduction   CKD (chronic kidney disease) stage 3, GFR 30-59 ml/min (HCC)    "stable now since they backed off the water pills" (11/24/2016)   Coronary artery disease    a. 1994 s/p CABG x 4 (LIMA-LAD, VG->D2, VG->OM, VG->RCA); b. 02/2004 PCI SVG-D2 (3.5x16 Taxus DES). VG->RCA 100. Sev apical LAD dzs distal to LIMA insertion; c. 11/2016 Cath: LAD 95p/135m, D2 100ost, LCX 100ost, 70p/m, RCA 100p/m, RPDA fills via L->R collats. VG->RCA 100, VG->D2 20 ost, patent prox stent, 106m, LIMA->LAD ok, VG->OM3 20p. EF 55%-->Med Rx.   GERD (gastroesophageal reflux disease)    High cholesterol    History of lower GI bleeding    a. 09/2014 GIB due to diverticulosis/diverticulitis.   Iron deficiency anemia     Labile Hypertension    Moderate aortic stenosis    a. 10/2011 Echo:  EF >55%, mild-mod TR, mild-mod AS, mod Ca2+ of AoV leaflets; b. 07/2016 Echo: EF 60-65%, no rwma, Gr1 DD, mod AS [(S) mean grad , peak grad . Valve area (VTI): 1.33cm^2, (Vmax) 1.44cm^2. Mild MR]; c. 02/2018 Echo: EF 55-60%, no rwma, GR1 DD, Mod AS [Peak Vel (S): 319cm/s, Mean grad (S) , peak grad (S) 74mmHg].   PAF (paroxysmal atrial fibrillation) (HCC)    a. CHA2DS2VASc = 4-->Eliquis .   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 ENDOSCOPY;  Service: Cardiovascular;  Laterality: N/A;   CARDIOVERSION N/A 02/05/2020   Procedure: CARDIOVERSION;  Surgeon: Loyde Rule, MD;  Location: [facility] ENDOSCOPY;  Service: Cardiovascular;  Laterality: N/A;   CATARACT EXTRACTION W/ INTRAOCULAR LENS IMPLANT Left    CORONARY ANGIOPLASTY  1994 X 2   "before bypass surgery"   CORONARY ANGIOPLASTY WITH STENT PLACEMENT  03/04/2004   SVG supplying the diagonal vessel stented with a 3.5x18mm Taxus stent post dilated to 4.0 mm   CORONARY ARTERY BYPASS GRAFT  1994   "CABG X4"   INGUINAL HERNIA REPAIR Right    PACEMAKER IMPLANT N/A 11/20/2019   Procedure: PACEMAKER IMPLANT;  Surgeon: Verona Goodwill, MD;  Location: [facility] INVASIVE CV LAB;  Service: Cardiovascular;  Laterality: N/A;   RIGHT HEART CATH AND CORONARY/GRAFT ANGIOGRAPHY N/A 11/24/2016   Procedure: Right Heart Cath and Coronary/Graft Angiography;  Surgeon: Millicent Ally, MD;  Location: [facility] INVASIVE CV LAB;  Service: Cardiovascular;  Laterality: N/A;   RIGHT/LEFT HEART CATH AND  CORONARY/GRAFT ANGIOGRAPHY N/A 10/18/2019   Procedure: RIGHT/LEFT HEART CATH AND CORONARY/GRAFT  ANGIOGRAPHY;  Surgeon: Millicent Ally, MD;  Location: [facility] INVASIVE CV LAB;  Service: Cardiovascular;  Laterality: N/A;   TEE WITHOUT CARDIOVERSION N/A 11/14/2019   Procedure: TRANSESOPHAGEAL ECHOCARDIOGRAM (TEE);  Surgeon: Odie Benne, MD;  Location: [facility] INVASIVE CV LAB;  Service: Open Heart Surgery;  Laterality: N/A;   TONSILLECTOMY AND ADENOIDECTOMY     TRANSCATHETER AORTIC VALVE REPLACEMENT, TRANSFEMORAL N/A 11/14/2019   Procedure: TRANSCATHETER AORTIC VALVE REPLACEMENT, TRANSFEMORAL;  Surgeon: Odie Benne, MD;  Location: [facility] INVASIVE CV LAB;  Service: Open Heart Surgery;  Laterality: N/A;    Allergies  Allergen Reactions   Ramipril Swelling and Other (See Comments)    Mouth swelling Other reaction(s): Tongue swelling?   Guaifenesin Er Hives, Swelling and Other (See Comments)    Mouth swelling Other reaction(s): swelling of tongue   Azithromycin     Other reaction(s): Does not work Other reaction(s): Does not work   Iodinated [employer] Other (See Comments)    Made eyes change each time Other reaction(s): Unknown    Current Outpatient Medications  Medication Sig Dispense Refill   albuterol  (VENTOLIN  HFA) 108 (90 Base) MCG/ACT inhaler USE 2 INHALATIONS BY MOUTH EVERY 4 - 6 HOURS AS NEEDED 18 g 0   amLODipine  (NORVASC ) 10 MG tablet TAKE 1 TABLET BY MOUTH EVERY DAY 90 tablet 3   atorvastatin  (LIPITOR) 20 MG tablet Take 1 tablet (20 mg total) by mouth at bedtime. 30 tablet 0   ezetimibe  (ZETIA ) 10 MG tablet Take 1 tablet (10 mg total) by mouth daily. 90 tablet 1   fenofibrate  (TRICOR ) 145 MG tablet TAKE 1/2 TABLET BY MOUTH DAILY 45 tablet 3   finasteride (PROSCAR) 5 MG tablet Take 5 mg by mouth daily.     fluticasone  (FLONASE ) 50 MCG/ACT nasal spray Place 2 sprays into both nostrils daily as needed for allergies or rhinitis.      furosemide  (LASIX ) 20 MG tablet Take 1  tablet (20 mg total) by mouth 2 (two) times daily. 180 tablet 3   ipratropium (ATROVENT ) 0.03 % nasal spray Place 2 sprays into both nostrils 2 (two) times daily. 30 mL 11   isosorbide  mononitrate (IMDUR ) 30 MG 24 hr tablet TAKE 1 TABLET BY MOUTH EVERY DAY 90 tablet 3   KLOR-CON  M20 20 MEQ tablet TAKE 1 TABLET BY MOUTH EVERY DAY 90 tablet 3   levothyroxine  (SYNTHROID ) 25 MCG tablet Take 25 mcg by mouth daily before breakfast.      loratadine  (CLARITIN ) 10 MG tablet Take 1 tablet (10 mg total) by mouth 2 (two) times daily as needed for allergies (Can use an extra dose during flares). 60 tablet 11   Multiple Vitamins-Minerals (CENTRUM SILVER 50+MEN) TABS Take 1 tablet by mouth at bedtime.     Omega-3 Fatty Acids  (FISH OIL) 1000 MG CAPS Take 1,000 mg by mouth 2 (two) times daily.      pantoprazole  (PROTONIX ) 40 MG tablet TAKE 1 TABLET BY MOUTH EVERY DAY 90 tablet 1   ranolazine  (RANEXA ) 500 MG 12 hr tablet TAKE 1 TABLET BY MOUTH TWICE A DAY 180 tablet 3   Rivaroxaban  (XARELTO ) 15 MG TABS tablet Take 1 tablet (15 mg total) by mouth daily with supper. 90 tablet 2   RYALTRIS 665-25 MCG/ACT SUSP Place into both nostrils.     vitamin C  (ASCORBIC ACID ) 500 MG tablet Take 500 mg by mouth at bedtime.      vitamin E  400 UNIT capsule Take 400 Units by mouth daily.  zinc  gluconate 50 MG tablet Take 50 mg by mouth daily.     carvedilol  (COREG ) 3.125 MG tablet Take 3 tablets (9.375 mg total) by mouth daily. TAKE 2 TABLET 6.25 MG IN AM TAKE 3.125 MG IN THE PM 270 tablet 3   DYMISTA  137-50 MCG/ACT SUSP Place 1-2 sprays into both nostrils daily as needed. (Patient not taking: Reported on 10/13/2023)     nitroGLYCERIN  (NITROLINGUAL ) 0.4 MG/SPRAY spray PLACE 1 SPRAY UNDER THE TONGUE EVERY 5 (FIVE) MINUTES X 3 DOSES AS NEEDED FOR CHEST PAIN. (Patient not taking: Reported on 10/13/2023) 12 g 2   No current facility-administered medications for this visit.    Socially he is married and has 2 children and 4  grandchildren. There is no tobacco alcohol  use. Recently he was has been very active.  ROS General: Negative; No fevers, chills, or night sweats;  HEENT: He is hard of hearing; A new complaint is that of ringing in his ears; right eye diplopia; no sinus congestion, difficulty swallowing Pulmonary: Negative; No cough, wheezing, shortness of breath, hemoptysis Cardiovascular: See HPI GI: Positive for recent GI bleed secondary to diverticular disease GU: Negative; No dysuria, hematuria, or difficulty voiding Musculoskeletal: Negative; no myalgias, joint pain, or weakness Hematologic/Oncology: Negative; no easy bruising, bleeding Endocrine: Negative; no heat/cold intolerance; no diabetes Neuro: Visual changes following recent Skin: Negative; No rashes or skin lesions Psychiatric: Negative; No behavioral problems, depression Sleep: Positive for OSA with history of snoring, daytime sleepiness, no bruxism, restless legs, hypnogognic hallucinations, no cataplexy Other comprehensive 14 point system review is negative.  PE BP 110/60   Pulse 70   Ht 5\' 8"  (1.727 m)   Wt 183 lb (83 kg)   SpO2 96%   BMI 27.83 kg/m    Repeat blood pressure by me was elevated at 154/70  Wt Readings from Last 3 Encounters:  10/13/23 183 lb (83 kg)  08/30/23 183 lb 6.4 oz (83.2 kg)  05/20/23 180 lb (81.6 kg)   General: Alert, oriented, no distress.  Skin: normal turgor, no rashes, warm and dry HEENT: Normocephalic, atraumatic. Pupils equal round and reactive to light; sclera anicteric; extraocular muscles intact;  Nose without nasal septal hypertrophy Mouth/Parynx benign; Mallinpatti scale 3 Neck: No JVD, no carotid bruits; normal carotid upstroke Lungs: clear to ausculatation and percussion; no wheezing or rales Chest wall: without tenderness to palpitation Heart: PMI not displaced, RRR, s1 s2 normal, 1/6 systolic murmur, no diastolic murmur, no rubs, gallops, thrills, or heaves Abdomen: soft, nontender;  no hepatosplenomehaly, BS+; abdominal aorta nontender and not dilated by palpation. Back: no CVA tenderness Pulses 2+ Musculoskeletal: full range of motion, normal strength, no joint deformities Extremities: Improvement in prior ankle edema; no clubbing cyanosis, Homan's sign negative  Neurologic: grossly nonfocal; Cranial nerves grossly wnl Psychologic: Normal mood and affect    EKG Interpretation Date/Time:  Wednesday October 13 2023 13:58:44 EDT Ventricular Rate:  70 PR Interval:    QRS Duration:  148 QT Interval:  438 QTC Calculation: 473 R Axis:   -51  Text Interpretation: Atrial fibrillation with frequent ventricular-paced complexes Left bundle branch block When compared with ECG of 30-Aug-2023 11:55, Electronic ventricular pacemaker has replaced Atrial fibrillation Confirmed by Magnus Schuller ([id number]) on 10/13/2023 2:29:16 PM    February 02, 2023 ECG (independently read by me): V paced rhythm at 76  July 20, 2022 ECG (independently read by me): Atrial fibrillation at 76, overriding pacemaker  April 14, 2022 ECG (independently read by me): V paced rhythm at  29   December 03, 2021 ECG (independently read by me): V paced rhythm   July 21, 2021 ECG (independently read by me): Sinus rhythm with frequent V pacing  March 19, 2021 ECG (independently read by me): V paced at 55. Underlying AF   November 05, 2020 ECG (independently read by me):  Atrial fibrillation at 76, LVH with QRS widening, LAD;  March 3,2022 ECG (independently read by me): Atrial fibrillation at 64, QTc 480 msec  ECG (independently read by me): Atrial fibrillation at 62 with a competing junctional pacemaker, Left axis deviation, QTc 460 msec  March 2020 ECG (independently read by me): Sinus rhythm at 71 bpm.  First-degree block with 212 ms.  Right bundle branch block with repolarization changes.  Left anterior hemiblock.  January 26, 2018 ECG (independently read by me): Sinus bradycardia at 47 bpm with first-degree  AV block.  PR interval 218 ms.  LVH with QRS widening.  Left axis deviation.  October 28, 2017 ECG (independently read by me): Atrial fibrillation with variable rate that seems to be more in the 50s to 60s but following a pause drops into the 40s  August 26, 2017 ECG (independently read by me): atrial fibrillation at 56 bpm.  Nonspecific ST changes.  QTc interval 476 ms.  February 2019 ECG (independently read by me): Atrial fibrillation at 71 bpm.  LVH by voltage in aVL.  Nonspecific T changes.  04/27/2017 ECG (independently read by me): Normal sinus rhythm at 60 bpm.  Nonspecific intraventricular block.  Anterior T-wave abnormality  03/23/2017 ECG (independently read by me): atrial fibrillation at 64 bpm.  Nonspecific interventricular block.  Nonspecific T wave abnormality.  02/17/2017 ECG (independently read by me): Atrial fibrillation at 72 bpm.  RV conduction delay.  QTc interval 453 ms.  01/29/2017 ECG (independently read by me): Atrial fibrillation at 56 bpm.  LVH by voltage criteria.  Nonspecific ST changes.  QTc interval 430 ms.  August 2018 ECG (independently read by me): Sinus bradycardia 59 bpm.  LVH by voltage.  QTc interval 469 ms.  No significant ST-T changes.  April 2018 ECG (independently read by me): Sinus rhythm at 61 bpm.  RV conduction delay.  LVH.  March 2018 ECG (independently read by me): Atrial fibrillation at 71 bpm.  RV conduction delay.  QTc interval 456 ms.  Left axis deviation.  February 2018 ECG (independently read by me): Normal sinus rhythm at 60 bpm.  LVH.  QTc interval at 464 ms.  Nonspecific T abnormality  December 2017 ECG (independently read by me):  Sinus bradycardia 57 bpm.  LVH by voltage. No significant ST changes.  January 10, 2016 ECG (independently read by me): Normal sinus rhythm at 64 bpm.  LVH.  QTc interval 468 ms.  December 2016 ECG (independently read by me):  Sinus bradycardia 51 bpm.  No ectopy. QTc interval 429 ms.  LVH by voltage criteria  in aVL.  September 2016 ECG (independently read by me):  Sinus bradycardia with first-degree AV block. RV conduction delay. No significant ST-T changes.   August 2016 ECG (independently read by me): Sinus bradycardia 57 bpm.  Mild RV conduction delay.  May 2016 ECG (independently read by me): Sinus bradycardia at 49 bpm with first-degree AV block with a PR interval 218 ms.  Right reticular conduction delay.  December 2015 ECG (independently read by me).  For these: Sinus bradycardia 57 bpm.  First-degree AV block with a PR interval at 224 ms.  No significant ST-T changes.  March 2015 ECG (independently read by me): Sinus rhythm at 59 beats per minute. Mild LVH by voltage criteria in aVL. Normal intervals.  01/05/2013 ECG: Normal sinus rhythm at 62. Mild RV conduction delay. PR interval 204 ms, QTc interval 452 ms.  LABS:    Latest Ref Rng & Units 10/13/2023    5:06 PM 05/20/2022    3:41 PM 04/15/2022    1:26 PM  BMP  Glucose 70 - 99 mg/dL 82  91  92   BUN 8 - 27 mg/dL 24  22  18    Creatinine 0.76 - 1.27 mg/dL 4.09  8.11  9.14   BUN/Creat Ratio 10 - 24 14  15     Sodium 134 - 144 mmol/L 142  142  139   Potassium 3.5 - 5.2 mmol/L 4.6  4.1  4.1   Chloride 96 - 106 mmol/L 107  108  110   CO2 20 - 29 mmol/L 19  19  21    Calcium  8.6 - 10.2 mg/dL 9.1  8.9  9.1       Latest Ref Rng & Units 10/13/2023    5:06 PM 04/15/2022    1:26 PM 06/28/2021    5:23 PM  Hepatic Function  Total Protein 6.0 - 8.5 g/dL 6.5  7.0  6.5   Albumin 3.7 - 4.7 g/dL 4.3  3.1  3.5   AST 0 - 40 IU/L 32  30  25   ALT 0 - 44 IU/L 21  30  22    Alk Phosphatase 44 - 121 IU/L 50  43  43   Total Bilirubin 0.0 - 1.2 mg/dL 0.3  0.6  0.6       Latest Ref Rng & Units 10/13/2023    4:00 PM 05/20/2022    3:41 PM 04/15/2022    1:26 PM  CBC  WBC 3.4 - 10.8 x10E3/uL 5.7  5.4  7.9   Hemoglobin 13.0 - 17.7 g/dL 78.2  95.6  9.9   Hematocrit 37.5 - 51.0 % 35.7  32.0  31.6   Platelets 150 - 450 x10E3/uL 161  249  263    Lab Results   Component Value Date   MCV 95 10/13/2023   MCV 87 05/20/2022   MCV 90.5 04/15/2022   Lab Results  Component Value Date   TSH 3.270 10/13/2023   Lab Results  Component Value Date   HGBA1C 5.5 06/28/2021     Lipid Panel     Component Value Date/Time   CHOL 108 07/11/2021 0927   CHOL 137 04/09/2014 1115   TRIG 123 07/11/2021 0927   TRIG 182 (H) 04/09/2014 1115   HDL 38 (L) 07/11/2021 0927   HDL 32 (L) 04/09/2014 1115   CHOLHDL 2.8 07/11/2021 0927   CHOLHDL 3.2 06/28/2021 2349   VLDL 14 06/28/2021 2349   LDLCALC 48 07/11/2021 0927   LDLCALC 69 04/09/2014 1115     RADIOLOGY: No results found.  A CPAP download was obtained in the office with reference to his obstructive sleep apnea from August 18 through January 24, 2018. He is 100% compliant with usage days and averaging 6 hours and 35 minutes of usage daily.  At his 12 cm pressure, AHI is excellent at 0.9.  He does have a mask leak.  CPAP November 10 through April 18, 2018: Compliance 97% usage days and usage greater than 4 hours.  Average use 6 hours 46 minutes.  At 12 cm pressure, AHI 1.2/h.  CPAP download June 25, 2018  through July 24, 2018.  Usage 87%, usage greater than 4 hours 83%, average usage on days used 6 hours and 34 minutes.  AHI 1.2 8012 cm pressure.  A download was obtained in the office from May 2022 through November 04, 2020.  Usage days only 63%.  Average usage is 6 hours 22 minutes.  AHI 5.0 with a auto minimum pressure 11 maximum pressure 16 with 95 percentile pressure 11.5.  There is mask leak.  A download from February 12 through July 21, 2021 showed 50% use although the patient was hospitalized during some of this time..  Average use 5 hours and 50 minutes.  There is significant mask leak contributing to increased AHI at 9.7.  A download from August 26 through February 02, 2020 showed usage days at 83% with average use at 6 hours 31 minutes.  At a pressure range of 11 to 16 cm, AHI is 3.2.   There is significant mask leak.  95 percentile pressure 12.0 maximum average pressure 12.6.   IMPRESSION:  1. Coronary artery disease involving coronary bypass graft of native heart without angina pectoris   2. Hx of CABG: 1994   3. Primary hypertension   4. Permanent atrial fibrillation (HCC)   5. Pacemaker   6. S/P TAVR (transcatheter aortic valve replacement): November 14, 2019   7. OSA (obstructive sleep apnea)   8. Anticoagulated   9. Medication management    ASSESSMENT AND PLAN: Mr. Mey is an 88 year-old  male who is status post CABG revascularization surgery in 1994.  Subsequently he underwent DES stenting to the graft supplying the diagonal vessel in 2003.  In July 2017 he was hospitalized with atrial fibrillation in the setting of bradycardia and hypotension leading to medication adjustment  An echo Doppler study of 11/21/2015 showed an EF of 55-60%, mild aortic stenosis with a valve area of 1.75 mm per there was mild pulmonary hypertension. Subsequently he had complained of experiencing some episodes of chest tightness and his symptoms improved with further titration of nitrates as well as beta blocker therapy.  He developed significant anemia, which undoubtedly exacerbated his anginal symptomatology.  At catheterization in July 2018 although his LIMA to mid LAD and vein graft to diagonal vessel are patent the very proximal LAD had a 95% stenosis which supplies a diagonal vessel, which was not supplied by the grafts.  His native RCA and graft to RCA was occluded and there is some collateralization to the distal RCA via the vein graft supplying the circumflex marginal vessel.  His anginal symptoms improved with stabilization of his hemoglobin.  Spironolactone  was discontinued secondary to renal insufficiency.  Due to progressive aortic stenosis he underwent TAVR with 26 mm SAPIEN 3 valve on November 14, 2019 and due to subsequent prolonged pauses with syncope underwent dual chamber [employer]tronic  permanent pacemaker by Dr. Rodolfo Clan on November 20, 2019.  He had been on Eliquis  therapy for persistent long-term atrial fibrillation.  He has had issues with lower extremity edema, and was hospitalized with suspected CVA in the setting of missed doses of Eliquis  in February 2023.  He subsequently developed diplopia of his right eye and had worn prism  glasses.  He has developed increasing creatinine greater than 1.5 and his Eliquis  dose has been reduced to 2.5 mg twice a day.  Since his prior evaluation with me, he was transition to Xarelto  from Eliquis  due to cost.  His blood pressure today was stable on presentation but mildly increased on repeat by  me.  He has been on a regiment of amlodipine  10 mg, carvedilol  3.125 mg twice a day, furosemide  20 mg twice a day, isosorbide  30 mg daily and Ranexa  500 mg twice a day both for blood pressure and his underlying CAD.  When last seen he was having increased leg swelling and he was advised that he can take 40 mg of furosemide  in the morning depending upon leg swelling.  He is not having any anginal symptomatology.  He has underlying atrial fibrillation and is ventricular paced.  I have suggested slight increase in carvedilol  to 6.25 mg in the morning but for him to continue 3.125 mg at night.  He can take extra Lasix  as needed if swelling recurs.  I am rechecking comprehensive metabolic panel, CBC, and TSH levels today.  Most recent lipid studies in November 2024 were excellent with total cholesterol 103, HDL 36, LDL 45 and triglycerides 112.  He continues to use CPAP and he will be changed to have [employer] as his DME company since [facility] is no longer doing CPAP management.  With his last echo Doppler study in 2023 I have recommend he undergo a follow-up echo Doppler evaluation.  He is aware of my upcoming retirement.  It has been a great pleasure to take care of him for over 30 years and he is very appreciative of this longstanding relationship.  I will transition  him to the cardiology care of Dr. Arnoldo Lapping with 86-month follow-up.  He will need to be transitioned to the sleep care of Dr. Micael Adas and be seen in 1 year or as needed from a sleep perspective.   Millicent Ally, MD, [facility]  10/16/2023 10:36 AM

## 2023-10-13 NOTE — Progress Notes (Signed)
 Received a call from [facility] with [facility] and was asked to place an order for CMP- future, then release the order. The order slip did not have all 3 labs that were ordered on it; so new order had to be placed.  Canceled the previous order.

## 2023-10-13 NOTE — Patient Instructions (Signed)
 Medication Instructions:   START TAKING:  CARVEDILOL  6.25 MG IN AM AND TAKE 3.125 MG  IN THE PM   *If you need a refill on your cardiac medications before your next appointment, please call your pharmacy*   Lab Work:  PLEASE GO DOWN STAIRS  [facility]  FIRST FLOOR   ( GET OFF ELEVATORS WALK TOWARDS WAITING AREA LAB LOCATED BY PHARMACY): CMET CBC AND TSH TODAY    If you have labs (blood work) drawn today and your tests are completely normal, you will receive your results only by: MyChart Message (if you have MyChart) OR A paper copy in the mail If you have any lab test that is abnormal or we need to change your treatment, we will call you to review the results.   Testing/Procedures: Your physician has requested that you have an echocardiogram. Echocardiography is a painless test that uses sound waves to create images of your heart. It provides your doctor with information about the size and shape of your heart and how well your heart's chambers and valves are working. This procedure takes approximately one hour. There are no restrictions for this procedure. Please do NOT wear cologne, perfume, aftershave, or lotions (deodorant is allowed). Please arrive 15 minutes prior to your appointment time.  Please note: We ask at that you not bring children with you during ultrasound (echo/ vascular) testing. Due to room size and safety concerns, children are not allowed in the ultrasound rooms during exams. Our front office staff cannot provide observation of children in our lobby area while testing is being conducted. An adult accompanying a patient to their appointment will only be allowed in the ultrasound room at the discretion of the ultrasound technician under special circumstances. We apologize for any inconvenience.    Follow-Up: At [facility], you and your health needs are our priority.  As part of our continuing mission to provide you with exceptional heart care, our providers are  all part of one team.  This team includes your primary Cardiologist (physician) and Advanced Practice Providers or APPs (Physician Assistants and Nurse Practitioners) who all work together to provide you with the care you need, when you need it.   Your next appointment: DR Adolphus Hoops  2025    DR TURNER AS NEEDED FOR SLEEP   We recommend signing up for the patient portal called "MyChart".  Sign up information is provided on this After Visit Summary.  MyChart is used to connect with patients for Virtual Visits (Telemedicine).  Patients are able to view lab/test results, encounter notes, upcoming appointments, etc.  Non-urgent messages can be sent to your provider as well.   To learn more about what you can do with MyChart, go to [email or web address].   Other Instructions

## 2023-10-14 LAB — COMPREHENSIVE METABOLIC PANEL WITH GFR
ALT: 21 IU/L (ref 0–44)
AST: 32 IU/L (ref 0–40)
Albumin: 4.3 g/dL (ref 3.7–4.7)
Alkaline Phosphatase: 50 IU/L (ref 44–121)
BUN/Creatinine Ratio: 14 (ref 10–24)
BUN: 24 mg/dL (ref 8–27)
Bilirubin Total: 0.3 mg/dL (ref 0.0–1.2)
CO2: 19 mmol/L — ABNORMAL LOW (ref 20–29)
Calcium: 9.1 mg/dL (ref 8.6–10.2)
Chloride: 107 mmol/L — ABNORMAL HIGH (ref 96–106)
Creatinine, Ser: 1.75 mg/dL — ABNORMAL HIGH (ref 0.76–1.27)
Globulin, Total: 2.2 g/dL (ref 1.5–4.5)
Glucose: 82 mg/dL (ref 70–99)
Potassium: 4.6 mmol/L (ref 3.5–5.2)
Sodium: 142 mmol/L (ref 134–144)
Total Protein: 6.5 g/dL (ref 6.0–8.5)
eGFR: 38 mL/min/{1.73_m2} — ABNORMAL LOW (ref >59)

## 2023-10-14 LAB — CBC
Hematocrit: 35.7 % — ABNORMAL LOW (ref 37.5–51.0)
Hemoglobin: 11.7 g/dL — ABNORMAL LOW (ref 13.0–17.7)
MCH: 31.1 pg (ref 26.6–33.0)
MCHC: 32.8 g/dL (ref 31.5–35.7)
MCV: 95 fL (ref 79–97)
Platelets: 161 10*3/uL (ref 150–450)
RBC: 3.76 x10E6/uL — ABNORMAL LOW (ref 4.14–5.80)
RDW: 12.9 % (ref 11.6–15.4)
WBC: 5.7 10*3/uL (ref 3.4–10.8)

## 2023-10-14 LAB — TSH: TSH: 3.27 u[IU]/mL (ref 0.450–4.500)

## 2023-10-16 ENCOUNTER — Encounter: Payer: Self-pay | Admitting: Cardiovascular Disease

## 2023-10-16 ENCOUNTER — Ambulatory Visit: Payer: Self-pay | Admitting: Cardiovascular Disease

## 2023-10-16 DIAGNOSIS — N1832 Chronic kidney disease, stage 3b: Secondary | ICD-10-CM

## 2023-10-16 DIAGNOSIS — I10 Essential (primary) hypertension: Secondary | ICD-10-CM

## 2023-10-18 ENCOUNTER — Encounter: Payer: Self-pay | Admitting: Cardiovascular Disease

## 2023-10-18 NOTE — Telephone Encounter (Signed)
 Error

## 2023-10-30 ENCOUNTER — Telehealth: Payer: Self-pay | Admitting: Cardiology

## 2023-10-30 MED ORDER — FINASTERIDE 5 MG PO TABS: 5.0000 mg | ORAL_TABLET | Freq: Every day | ORAL | 6 refills | Status: DC

## 2023-10-30 MED ORDER — ISOSORBIDE MONONITRATE ER 30 MG PO TB24: 30.0000 mg | ORAL_TABLET | Freq: Every day | ORAL | 6 refills | Status: DC

## 2023-10-30 NOTE — Telephone Encounter (Signed)
 Outpatient service line: Refill request  Patient reports being completely out of his finasteride 5 mg and Imdur  30 mg.  Providing refills.  Not having any chest pain or shortness of breath.  She is asking for refill.

## 2023-11-10 ENCOUNTER — Other Ambulatory Visit: Payer: Self-pay | Admitting: Gastroenterology

## 2023-11-10 DIAGNOSIS — K862 Cyst of pancreas: Secondary | ICD-10-CM

## 2023-11-18 ENCOUNTER — Ambulatory Visit: Payer: Medicare PPO

## 2023-11-18 DIAGNOSIS — I442 Atrioventricular block, complete: Secondary | ICD-10-CM | POA: Diagnosis not present

## 2023-11-19 LAB — CUP PACEART REMOTE DEVICE CHECK
Battery Remaining Longevity: 118 mo
Battery Voltage: 2.99 V
Brady Statistic RA Percent Paced: 0 %
Brady Statistic RV Percent Paced: 42.77 %
Date Time Interrogation Session: 20250710010424
Implantable Lead Connection Status: 753985
Implantable Lead Connection Status: 753985
Implantable Lead Implant Date: 20210712
Implantable Lead Implant Date: 20210712
Implantable Lead Location: 753859
Implantable Lead Location: 753860
Implantable Lead Model: 5076
Implantable Lead Model: 5076
Implantable Pulse Generator Implant Date: 20210712
Lead Channel Impedance Value: 285 Ohm
Lead Channel Impedance Value: 342 Ohm
Lead Channel Impedance Value: 342 Ohm
Lead Channel Impedance Value: 399 Ohm
Lead Channel Pacing Threshold Amplitude: 0.625 V
Lead Channel Pacing Threshold Amplitude: 0.75 V
Lead Channel Pacing Threshold Pulse Width: 0.4 ms
Lead Channel Pacing Threshold Pulse Width: 0.4 ms
Lead Channel Sensing Intrinsic Amplitude: 0.875 mV
Lead Channel Sensing Intrinsic Amplitude: 0.875 mV
Lead Channel Sensing Intrinsic Amplitude: 9.25 mV
Lead Channel Sensing Intrinsic Amplitude: 9.25 mV
Lead Channel Setting Pacing Amplitude: 2.5 V
Lead Channel Setting Pacing Pulse Width: 0.4 ms
Lead Channel Setting Sensing Sensitivity: 0.9 mV
Zone Setting Status: 755011

## 2023-11-23 ENCOUNTER — Ambulatory Visit: Payer: Self-pay | Admitting: Cardiology

## 2023-11-30 ENCOUNTER — Ambulatory Visit (HOSPITAL_COMMUNITY)
Admission: RE | Admit: 2023-11-30 | Discharge: 2023-11-30 | Disposition: A | Discharge status: Home / Self Care | Source: Ambulatory Visit | Attending: Cardiology | Admitting: Cardiology

## 2023-11-30 DIAGNOSIS — I25810 Atherosclerosis of coronary artery bypass graft(s) without angina pectoris: Secondary | ICD-10-CM | POA: Diagnosis not present

## 2023-11-30 DIAGNOSIS — I10 Essential (primary) hypertension: Secondary | ICD-10-CM | POA: Diagnosis not present

## 2023-11-30 LAB — ECHOCARDIOGRAM LIMITED
AV Mean grad: 12.6 mmHg
AV Peak grad: 21.6 mmHg
Ao pk vel: 2.32 m/s
S' Lateral: 4.2 cm

## 2023-12-01 LAB — BASIC METABOLIC PANEL WITH GFR
BUN/Creatinine Ratio: 18 (ref 10–24)
BUN: 34 mg/dL — ABNORMAL HIGH (ref 8–27)
CO2: 19 mmol/L — ABNORMAL LOW (ref 20–29)
Calcium: 9.3 mg/dL (ref 8.6–10.2)
Chloride: 110 mmol/L — ABNORMAL HIGH (ref 96–106)
Creatinine, Ser: 1.84 mg/dL — ABNORMAL HIGH (ref 0.76–1.27)
Glucose: 101 mg/dL — ABNORMAL HIGH (ref 70–99)
Potassium: 4.6 mmol/L (ref 3.5–5.2)
Sodium: 147 mmol/L — ABNORMAL HIGH (ref 134–144)
eGFR: 35 mL/min/1.73 — ABNORMAL LOW (ref >59)

## 2023-12-07 DIAGNOSIS — H903 Sensorineural hearing loss, bilateral: Secondary | ICD-10-CM | POA: Diagnosis not present

## 2023-12-07 DIAGNOSIS — R0981 Nasal congestion: Secondary | ICD-10-CM | POA: Diagnosis not present

## 2023-12-10 NOTE — Telephone Encounter (Signed)
Letter of results sent to pt  

## 2023-12-14 ENCOUNTER — Encounter: Admitting: Cardiology

## 2023-12-28 ENCOUNTER — Encounter: Admitting: Cardiology

## 2023-12-29 ENCOUNTER — Telehealth: Payer: Self-pay | Admitting: *Deleted

## 2023-12-29 NOTE — Telephone Encounter (Signed)
 Patient called in today needing a new DME. He is a [employer] transfer. We have set him up with [employer] for supplies and trouble shooting his machine.

## 2023-12-31 NOTE — Telephone Encounter (Signed)
 Patient's wife calling back, she wants to ask Dr. Wonda again if the patient need to repeat any lab work and recommendation

## 2024-01-03 ENCOUNTER — Encounter: Admitting: Pulmonary Disease

## 2024-01-04 NOTE — Progress Notes (Unsigned)
  Electrophysiology Office Note:   Date:  01/05/2024  ID:  Jorge Cherry, DOB October 23, 1935, MRN [id number]  Primary Cardiologist: Debby Sor, MD (Inactive) Primary Heart Failure: None Electrophysiologist: Will Gladis Norton, MD       History of Present Illness:   Jorge Cherry is a 88 y.o. male with h/o moderate AS, pre-existing RBBB pre-TAVR, LBBB post TAVR, syncope s/p PPM, permanent AF, CAD s/p CABG, HFpEF / ICM, HLD, CKD, LGIB, IDA & OSA seen today for routine electrophysiology followup.   Since last being seen in our clinic the patient reports doing well overall. He has his service dog with him.   He has not had concerns with his device > interestingly, he states he got shocked by it once when he was really stressed by a situation with his sister in law.  They are caring for her in [place].   He denies chest pain, palpitations, dyspnea, PND, orthopnea, nausea, vomiting, dizziness, syncope, edema, weight gain, or early satiety.   Review of systems complete and found to be negative unless listed in HPI.    EP Information / Studies Reviewed:    EKG is not ordered today. EKG from 10/13/23 reviewed which showed AF with VP events, 70 bpm      PPM Interrogation-  reviewed in detail today,  See PACEART report.  Device History: Medtronic Dual Chamber PPM implanted 11/20/2019 for AF, CHB (RBBB pre-TAVR /LBBB post TAVR)  Risk Assessment/Calculations:    CHA2DS2-VASc Score = 5   This indicates a 7.2% annual risk of stroke. The patient's score is based upon: CHF History: 1 HTN History: 1 Diabetes History: 0 Stroke History: 0 Vascular Disease History: 1 Age Score: 2 Gender Score: 0             Physical Exam:   VS:  BP 124/62   Pulse 65   Ht 5' 8 (1.727 m)   Wt 185 lb 12.8 oz (84.3 kg)   SpO2 94%   BMI 28.25 kg/m    Wt Readings from Last 3 Encounters:  01/05/24 185 lb 12.8 oz (84.3 kg)  10/13/23 183 lb (83 kg)  08/30/23 183 lb 6.4 oz (83.2 kg)     GEN: Well  nourished, well developed in no acute distress NECK: No JVD; No carotid bruits CARDIAC: Regular rate and rhythm, no murmurs, rubs, gallops RESPIRATORY:  Clear to auscultation without rales, wheezing or rhonchi  ABDOMEN: Soft, non-tender, non-distended EXTREMITIES:  trace LE edema; No deformity   ASSESSMENT AND PLAN:    HCB (RBBB pre-TAVR / LBBB post-TAVR), Syncope, Atrial Fibrillation s/p Medtronic PPM  -Normal PPM function -See Pace Art report -No changes today  Permanent Atrial Fibrillation  CHA2DS2-VASc 5  -ventricular rates controlled, 60-80 bpm  -continue coreg  6.25 AM, 3.125 PM   -previously had 39% VP and metoprolol  was stopped > currently VP 32%  Secondary Hypercoagulable State  -continue Xarelto  -no bleeding issues   ICM, CAD s/p CABG  HFpEF  VHD: AS s/p TAVR -euvolemic on exam    -no anginal symptoms    Hypertension  -well controlled on current regimen    CKD  -per primary   Disposition:   Follow up with Dr. Norton / EP APP in 12 months    Signed, Daphne Barrack, NP-C, AGACNP-BC [facility] - Electrophysiology  01/05/2024, 5:06 PM

## 2024-01-05 ENCOUNTER — Encounter: Payer: Self-pay | Admitting: Pulmonary Disease

## 2024-01-05 ENCOUNTER — Ambulatory Visit: Attending: Cardiovascular Disease | Admitting: Pulmonary Disease

## 2024-01-05 VITALS — BP 124/62 | HR 65 | Ht 68.0 in | Wt 185.8 lb

## 2024-01-05 DIAGNOSIS — I4821 Permanent atrial fibrillation: Secondary | ICD-10-CM

## 2024-01-05 DIAGNOSIS — Z952 Presence of prosthetic heart valve: Secondary | ICD-10-CM

## 2024-01-05 DIAGNOSIS — Z951 Presence of aortocoronary bypass graft: Secondary | ICD-10-CM

## 2024-01-05 DIAGNOSIS — Z950 Presence of cardiac pacemaker: Secondary | ICD-10-CM

## 2024-01-05 DIAGNOSIS — I10 Essential (primary) hypertension: Secondary | ICD-10-CM | POA: Diagnosis not present

## 2024-01-05 DIAGNOSIS — D6869 Other thrombophilia: Secondary | ICD-10-CM

## 2024-01-05 DIAGNOSIS — I442 Atrioventricular block, complete: Secondary | ICD-10-CM

## 2024-01-05 DIAGNOSIS — G4733 Obstructive sleep apnea (adult) (pediatric): Secondary | ICD-10-CM | POA: Diagnosis not present

## 2024-01-05 LAB — CUP PACEART INCLINIC DEVICE CHECK
Date Time Interrogation Session: 20250827170738
Implantable Lead Connection Status: 753985
Implantable Lead Connection Status: 753985
Implantable Lead Implant Date: 20210712
Implantable Lead Implant Date: 20210712
Implantable Lead Location: 753859
Implantable Lead Location: 753860
Implantable Lead Model: 5076
Implantable Lead Model: 5076
Implantable Pulse Generator Implant Date: 20210712

## 2024-01-05 NOTE — Patient Instructions (Signed)
 Medication Instructions:  Your physician recommends that you continue on your current medications as directed. Please refer to the Current Medication list given to you today.  *If you need a refill on your cardiac medications before your next appointment, please call your pharmacy*  Lab Work: None ordered If you have labs (blood work) drawn today and your tests are completely normal, you will receive your results only by: MyChart Message (if you have MyChart) OR A paper copy in the mail If you have any lab test that is abnormal or we need to change your treatment, we will call you to review the results.  Follow-Up: At [facility], you and your health needs are our priority.  As part of our continuing mission to provide you with exceptional heart care, our providers are all part of one team.  This team includes your primary Cardiologist (physician) and Advanced Practice Providers or APPs (Physician Assistants and Nurse Practitioners) who all work together to provide you with the care you need, when you need it.  Your next appointment:   1 year(s)  Provider:   Soyla Norton, MD or Daphne Barrack, NP

## 2024-01-06 ENCOUNTER — Ambulatory Visit: Payer: Self-pay | Admitting: Cardiology

## 2024-01-06 NOTE — Addendum Note (Signed)
 Addended by: RICHIE ADRIEN ORN on: 01/06/2024 02:01 PM   Modules accepted: Orders

## 2024-01-11 ENCOUNTER — Encounter: Admitting: Cardiology

## 2024-01-13 DIAGNOSIS — K317 Polyp of stomach and duodenum: Secondary | ICD-10-CM | POA: Diagnosis not present

## 2024-01-13 DIAGNOSIS — Z860100 Personal history of colon polyps, unspecified: Secondary | ICD-10-CM | POA: Diagnosis not present

## 2024-01-13 DIAGNOSIS — K862 Cyst of pancreas: Secondary | ICD-10-CM | POA: Diagnosis not present

## 2024-01-20 ENCOUNTER — Telehealth: Payer: Self-pay | Admitting: Cardiovascular Disease

## 2024-01-20 NOTE — Telephone Encounter (Signed)
 Reviewed lower extremity arterial doppler from May '25 and it showed normal waveforms/normal blood flow. I've never seen the patient so tough for me to make specific recommendations. Could try to get him in for an earlier appt if he's concerned. thx

## 2024-01-20 NOTE — Telephone Encounter (Signed)
 Pt c/o swelling/edema: STAT if pt has developed SOB within 24 hours  If swelling, where is the swelling located? Right leg   How much weight have you gained and in what time span? No   Have you gained 2 pounds in a day or 5 pounds in a week? No   Do you have a log of your daily weights (if so, list)? Staying around 183-185 for about 2 months   Are you currently taking a fluid pill? Yes   Are you currently SOB? No   Have you traveled recently in a car or plane for an extended period of time? No    Pt called in stating he having some symptoms with his legs - legs feel tingly, for about 2 days  - vein in his ankle is popping out/swelling - left leg feels cold - big toe is blue   Previous Burnard, recall with Dr. Wonda in December

## 2024-01-20 NOTE — Telephone Encounter (Signed)
 Spoke with pt over the phone and he stated his legs are cool to touch and the left foot is the one where the big toe is discolored. Tingling has occurred in both legs. Denies redness on calves or bruising. Right leg is always swollen, left leg is swollen some but not as much as right leg. Feet feel slightly numb. Had issues with lower legs in the past and had bilateral LEA US  and ABI in May 2025. Pt denies any extra weight gain and is currently on Lasix  BID 20 mg. Pt is a prior [facility] pt but is supposed to see Dr. Wonda in December 2025. Will send to Dr. Wonda for any recommendations he may have.

## 2024-01-21 NOTE — Telephone Encounter (Signed)
 Spoke with pt and his wife and explained Dr. Margurite response. Pt was still concerned about symptoms not getting better and said that someone had offered him an earlier appt for next Friday 9/19 but he was still concerned about waiting until then. Offered pt DOD appt slot for Monday afternoon and he accepted. Advised that with the pt's concern and the symptoms still ongoing and not getting better, it may be beneficial for pt to be seen at an urgent care today (or ED if pt felt he needed high medical attention based on his symptoms). Pt and pt's wife agreed to this plan and asked if a copy of the LEA duplex and ABI from May 2025 could be sent to them for reference. Copies have been emailed to pt via secure email.

## 2024-01-24 ENCOUNTER — Encounter: Payer: Self-pay | Admitting: Internal Medicine

## 2024-01-24 ENCOUNTER — Ambulatory Visit: Attending: Internal Medicine | Admitting: Internal Medicine

## 2024-01-24 VITALS — BP 146/86 | HR 67 | Ht 68.0 in | Wt 185.9 lb

## 2024-01-24 DIAGNOSIS — R202 Paresthesia of skin: Secondary | ICD-10-CM | POA: Diagnosis not present

## 2024-01-24 DIAGNOSIS — R42 Dizziness and giddiness: Secondary | ICD-10-CM

## 2024-01-24 DIAGNOSIS — R600 Localized edema: Secondary | ICD-10-CM

## 2024-01-24 NOTE — Patient Instructions (Signed)
 Medication Instructions:  START use of knee high compression stockings  *If you need a refill on your cardiac medications before your next appointment, please call your pharmacy*   Follow-Up: At [facility], you and your health needs are our priority.  As part of our continuing mission to provide you with exceptional heart care, our providers are all part of one team.  This team includes your primary Cardiologist (physician) and Advanced Practice Providers or APPs (Physician Assistants and Nurse Practitioners) who all work together to provide you with the care you need, when you need it.  Your next appointment:   Keep upcoming appointment with Dr. Wonda   We recommend signing up for the patient portal called MyChart.  Sign up information is provided on this After Visit Summary.  MyChart is used to connect with patients for Virtual Visits (Telemedicine).  Patients are able to view lab/test results, encounter notes, upcoming appointments, etc.  Non-urgent messages can be sent to your provider as well.   To learn more about what you can do with MyChart, go to [email or web address].

## 2024-01-24 NOTE — Progress Notes (Signed)
 OFFICE NOTE  Chief Complaint:  Acute visit for discoloration of the toe  Primary Care Physician: Janey Santos, MD  HPI:  Jorge Cherry is a 88 y.o. male with a past medial history significant for coronary artery disease with prior CABG and PCI, chronic kidney disease, renal artery atherosclerosis, aortic stenosis status post TAVR, PAF, and anticoagulation.  He has been followed by Dr. Charlena Sor but is transitioning care to Dr. Garrel Fell and has an appointment later this week.  He does have a history of leg swelling including numbness and tingling in both legs and saw Barnie Press, NP about this in May 2025.  She ordered lower extremity arterial Dopplers which were normal.  He does have a history of vein extraction from the right lower extremity for bypass and chronic right greater than left lower extremity edema.  He has been on high-dose amlodipine  10 mg.  He reports that his left great toe was black about 2 weeks ago.  He has been working on a car and possibly injured it.  He has also been having issues with dizziness when changing position.  He notes that he has had sinus congestion as well.  PMHx:  Past Medical History:  Diagnosis Date   Arthritis    just a touch in my hands (11/24/2016)   Asthma    Atherosclerosis of renal artery (HCC)    RENAL DOPPLER, 12/10/2011 - Left renal artery demonstrated narrowing with elevated velocities consistent with a 1-59% diameter reduction   CKD (chronic kidney disease) stage 3, GFR 30-59 ml/min (HCC)    stable now since they backed off the water pills (11/24/2016)   Coronary artery disease    a. 1994 s/p CABG x 4 (LIMA-LAD, VG->D2, VG->OM, VG->RCA); b. 02/2004 PCI SVG-D2 (3.5x16 Taxus DES). VG->RCA 100. Sev apical LAD dzs distal to LIMA insertion; c. 11/2016 Cath: LAD 95p/170m, D2 100ost, LCX 100ost, 70p/m, RCA 100p/m, RPDA fills via L->R collats. VG->RCA 100, VG->D2 20 ost, patent prox stent, 58m, LIMA->LAD ok, VG->OM3 20p. EF 55%-->Med  Rx.   GERD (gastroesophageal reflux disease)    High cholesterol    History of lower GI bleeding    a. 09/2014 GIB due to diverticulosis/diverticulitis.   Iron deficiency anemia    Labile Hypertension    Moderate aortic stenosis    a. 10/2011 Echo:  EF >55%, mild-mod TR, mild-mod AS, mod Ca2+ of AoV leaflets; b. 07/2016 Echo: EF 60-65%, no rwma, Gr1 DD, mod AS [(S) mean grad , peak grad . Valve area (VTI): 1.33cm^2, (Vmax) 1.44cm^2. Mild MR]; c. 02/2018 Echo: EF 55-60%, no rwma, GR1 DD, Mod AS [Peak Vel (S): 319cm/s, Mean grad (S) , peak grad (S) 6mmHg].   PAF (paroxysmal atrial fibrillation) (HCC)    a. CHA2DS2VASc = 4-->Eliquis .   S/P TAVR (transcatheter aortic valve replacement) 11/14/2019   s/p TAVR with a 26 mm Edwards S3U via the TF approach by Drs Verlin & Bartle   Sleep apnea     Past Surgical History:  Procedure Laterality Date   CARDIAC CATHETERIZATION  02/27/2004   Coronary intervention and medical management   CARDIAC CATHETERIZATION  11/24/2016   CARDIOVERSION N/A 12/20/2019   Procedure: CARDIOVERSION;  Surgeon: Raford Riggs, MD;  Location: [facility] ENDOSCOPY;  Service: Cardiovascular;  Laterality: N/A;   CARDIOVERSION N/A 02/05/2020   Procedure: CARDIOVERSION;  Surgeon: Delford Maude BROCKS, MD;  Location: [facility] ENDOSCOPY;  Service: Cardiovascular;  Laterality: N/A;   CATARACT EXTRACTION W/ INTRAOCULAR LENS IMPLANT Left  CORONARY ANGIOPLASTY  1994 X 2   before bypass surgery   CORONARY ANGIOPLASTY WITH STENT PLACEMENT  03/04/2004   SVG supplying the diagonal vessel stented with a 3.5x70mm Taxus stent post dilated to 4.0 mm   CORONARY ARTERY BYPASS GRAFT  1994   CABG X4   INGUINAL HERNIA REPAIR Right    PACEMAKER IMPLANT N/A 11/20/2019   Procedure: PACEMAKER IMPLANT;  Surgeon: Fernande Elspeth BROCKS, MD;  Location: [facility] INVASIVE CV LAB;  Service: Cardiovascular;  Laterality: N/A;   RIGHT HEART CATH AND CORONARY/GRAFT ANGIOGRAPHY N/A 11/24/2016   Procedure: Right  Heart Cath and Coronary/Graft Angiography;  Surgeon: Burnard Debby LABOR, MD;  Location: [facility] INVASIVE CV LAB;  Service: Cardiovascular;  Laterality: N/A;   RIGHT/LEFT HEART CATH AND CORONARY/GRAFT ANGIOGRAPHY N/A 10/18/2019   Procedure: RIGHT/LEFT HEART CATH AND CORONARY/GRAFT ANGIOGRAPHY;  Surgeon: Burnard Debby LABOR, MD;  Location: [facility] INVASIVE CV LAB;  Service: Cardiovascular;  Laterality: N/A;   TEE WITHOUT CARDIOVERSION N/A 11/14/2019   Procedure: TRANSESOPHAGEAL ECHOCARDIOGRAM (TEE);  Surgeon: Verlin Lonni BIRCH, MD;  Location: [facility] INVASIVE CV LAB;  Service: Open Heart Surgery;  Laterality: N/A;   TONSILLECTOMY AND ADENOIDECTOMY     TRANSCATHETER AORTIC VALVE REPLACEMENT, TRANSFEMORAL N/A 11/14/2019   Procedure: TRANSCATHETER AORTIC VALVE REPLACEMENT, TRANSFEMORAL;  Surgeon: Verlin Lonni BIRCH, MD;  Location: [facility] INVASIVE CV LAB;  Service: Open Heart Surgery;  Laterality: N/A;    FAMHx:  Family History  Problem Relation Age of Onset   Cancer Mother    Heart disease Father        died age 26   Stroke Maternal Grandmother    Cancer Maternal Grandfather    Allergic rhinitis Neg Hx    Angioedema Neg Hx    Asthma Neg Hx    Atopy Neg Hx    Eczema Neg Hx    Immunodeficiency Neg Hx    Urticaria Neg Hx     SOCHx:   reports that he has never smoked. He has never used smokeless tobacco. He reports that he does not drink alcohol  and does not use drugs.  ALLERGIES:  Allergies  Allergen Reactions   Ramipril Other (See Comments) and Swelling    Mouth swelling  Other reaction(s): Tongue swelling?  Other Reaction(s): Edema   Guaifenesin Er Hives, Other (See Comments), Swelling and Rash    Mouth swelling  Other reaction(s): swelling of tongue   Azithromycin     Other reaction(s): Does not work Other reaction(s): Does not work   Iodinated [employer] Other (See Comments)    Made eyes change each time Other reaction(s): Unknown    ROS: Pertinent items noted in HPI and remainder of  comprehensive ROS otherwise negative.  HOME MEDS: Current Outpatient Medications on File Prior to Visit  Medication Sig Dispense Refill   albuterol  (VENTOLIN  HFA) 108 (90 Base) MCG/ACT inhaler USE 2 INHALATIONS BY MOUTH EVERY 4 - 6 HOURS AS NEEDED 18 g 0   amLODipine  (NORVASC ) 10 MG tablet TAKE 1 TABLET BY MOUTH EVERY DAY 90 tablet 3   atorvastatin  (LIPITOR) 20 MG tablet Take 1 tablet (20 mg total) by mouth at bedtime. 30 tablet 0   carvedilol  (COREG ) 3.125 MG tablet Take 3 tablets (9.375 mg total) by mouth daily. TAKE 2 TABLET 6.25 MG IN AM TAKE 3.125 MG IN THE PM 270 tablet 3   DYMISTA  137-50 MCG/ACT SUSP Place 1-2 sprays into both nostrils daily as needed.     ezetimibe  (ZETIA ) 10 MG tablet Take 1 tablet (10 mg  total) by mouth daily. 90 tablet 1   fenofibrate  (TRICOR ) 145 MG tablet TAKE 1/2 TABLET BY MOUTH DAILY 45 tablet 3   finasteride  (PROSCAR ) 5 MG tablet Take 1 tablet (5 mg total) by mouth daily. 30 tablet 6   fluticasone  (FLONASE ) 50 MCG/ACT nasal spray Place 2 sprays into both nostrils daily as needed for allergies or rhinitis.      furosemide  (LASIX ) 20 MG tablet Take 1 tablet (20 mg total) by mouth 2 (two) times daily. 180 tablet 3   isosorbide  mononitrate (IMDUR ) 30 MG 24 hr tablet Take 1 tablet (30 mg total) by mouth daily. 30 tablet 6   KLOR-CON  M20 20 MEQ tablet TAKE 1 TABLET BY MOUTH EVERY DAY 90 tablet 3   levothyroxine  (SYNTHROID ) 25 MCG tablet Take 25 mcg by mouth daily before breakfast.      Multiple Vitamins-Minerals (CENTRUM SILVER 50+MEN) TABS Take 1 tablet by mouth at bedtime.     Omega-3 Fatty Acids  (FISH OIL) 1000 MG CAPS Take 1,000 mg by mouth 2 (two) times daily.      pantoprazole  (PROTONIX ) 40 MG tablet TAKE 1 TABLET BY MOUTH EVERY DAY 90 tablet 1   ranolazine  (RANEXA ) 500 MG 12 hr tablet TAKE 1 TABLET BY MOUTH TWICE A DAY 180 tablet 3   Rivaroxaban  (XARELTO ) 15 MG TABS tablet Take 1 tablet (15 mg total) by mouth daily with supper. 90 tablet 2   RYALTRIS 665-25  MCG/ACT SUSP Place into both nostrils.     vitamin C  (ASCORBIC ACID ) 500 MG tablet Take 500 mg by mouth at bedtime.      vitamin E  400 UNIT capsule Take 400 Units by mouth daily.     zinc  gluconate 50 MG tablet Take 50 mg by mouth daily.     ipratropium (ATROVENT ) 0.03 % nasal spray Place 2 sprays into both nostrils 2 (two) times daily. 30 mL 11   loratadine  (CLARITIN ) 10 MG tablet Take 1 tablet (10 mg total) by mouth 2 (two) times daily as needed for allergies (Can use an extra dose during flares). 60 tablet 11   nitroGLYCERIN  (NITROLINGUAL ) 0.4 MG/SPRAY spray PLACE 1 SPRAY UNDER THE TONGUE EVERY 5 (FIVE) MINUTES X 3 DOSES AS NEEDED FOR CHEST PAIN. (Patient not taking: Reported on 01/24/2024) 12 g 2   No current facility-administered medications on file prior to visit.    LABS/IMAGING: No results found for this or any previous visit (from the past 48 hours). No results found.  LIPID PANEL:    Component Value Date/Time   CHOL 108 07/11/2021 0927   CHOL 137 04/09/2014 1115   TRIG 123 07/11/2021 0927   TRIG 182 (H) 04/09/2014 1115   HDL 38 (L) 07/11/2021 0927   HDL 32 (L) 04/09/2014 1115   CHOLHDL 2.8 07/11/2021 0927   CHOLHDL 3.2 06/28/2021 2349   VLDL 14 06/28/2021 2349   LDLCALC 48 07/11/2021 0927   LDLCALC 69 04/09/2014 1115     WEIGHTS: Wt Readings from Last 3 Encounters:  01/24/24 185 lb 14.4 oz (84.3 kg)  01/05/24 185 lb 12.8 oz (84.3 kg)  10/13/23 183 lb (83 kg)    VITALS: BP (!) 146/86 (BP Location: Left Arm, Patient Position: Sitting, Cuff Size: Normal)   Pulse 67   Ht 5' 8 (1.727 m)   Wt 185 lb 14.4 oz (84.3 kg)   SpO2 97%   BMI 28.27 kg/m   EXAM: Extremities: edema 1+ right lower extremity and trace left lower extremity and ecchymoses over the  left great and second toes Pulses: 2+ and symmetric Skin: Warm, dry, ecchymoses over the left great and second toes  EKG: EKG Interpretation Date/Time:  Monday January 24 2024 14:49:40 EDT Ventricular Rate:   67 PR Interval:    QRS Duration:  148 QT Interval:  416 QTC Calculation: 439 R Axis:   -50  Text Interpretation: Atrial fibrillation with occasional ventricular-paced complexes Left bundle branch block When compared with ECG of 13-Oct-2023 13:58, Vent. rate has decreased BY   3 BPM Confirmed by Mona Kent [phone]) on 01/24/2024 3:16:28 PM - personally reviewed  ASSESSMENT: Bilateral lower extremity edema Ecchymosis  PLAN: 1.   Jorge Cherry has bilateral lower extremity edema which recently has been somewhat worse but always is greater on the right than the left lower extremity.  This is likely venous insufficiency complicated by high-dose amlodipine .  Blood pressure however has not at goal.  He has been having issues with dizziness which is intermittent.  It seems to be worse and changing position or turning his head quickly and might indicate inner ear issues.  He has been having issues with sinus congestion spite using a nasal spray.  He feels it may be somewhat worse after his beta-blocker was increased and I advised him to decrease his carvedilol  back down to 3.125 mg twice daily.  This should be about 12 hours apart.  Also, I recommended 20 to 30 mmHg knee-high compression stocks bilaterally which should help with some of his edema.  He is on a diuretic.  He has a follow-up appointment with Dr. Wonda on Friday to reassess his legs.  It appears that his left great toe and second toe are probably ecchymotic from trauma being on a beta-blocker.  I think this is unlikely to be a blue toe syndrome.  Kent KYM Mona, MD, [facility], FNLA, FACP  [place]  [facility]  Medical Director of the Advanced Lipid Disorders &  Cardiovascular Risk Reduction Clinic Diplomate of the American Board of Clinical Lipidology Attending Cardiologist  Direct Dial: [phone]  Fax: [phone]  Website:  [email or web address] Kent JAYSON Mona 01/24/2024, 4:33 PM

## 2024-01-28 ENCOUNTER — Other Ambulatory Visit: Payer: Self-pay | Admitting: Student

## 2024-01-28 ENCOUNTER — Encounter: Payer: Self-pay | Admitting: Cardiovascular Disease

## 2024-01-28 ENCOUNTER — Ambulatory Visit: Attending: Cardiovascular Disease | Admitting: Cardiovascular Disease

## 2024-01-28 VITALS — BP 130/60 | HR 65 | Ht 70.0 in | Wt 185.2 lb

## 2024-01-28 DIAGNOSIS — R600 Localized edema: Secondary | ICD-10-CM

## 2024-01-28 DIAGNOSIS — I442 Atrioventricular block, complete: Secondary | ICD-10-CM

## 2024-01-28 DIAGNOSIS — I4821 Permanent atrial fibrillation: Secondary | ICD-10-CM

## 2024-01-28 DIAGNOSIS — I25708 Atherosclerosis of coronary artery bypass graft(s), unspecified, with other forms of angina pectoris: Secondary | ICD-10-CM | POA: Diagnosis not present

## 2024-01-28 DIAGNOSIS — I10 Essential (primary) hypertension: Secondary | ICD-10-CM

## 2024-01-28 DIAGNOSIS — E785 Hyperlipidemia, unspecified: Secondary | ICD-10-CM

## 2024-01-28 DIAGNOSIS — Z952 Presence of prosthetic heart valve: Secondary | ICD-10-CM

## 2024-01-28 MED ORDER — RIVAROXABAN 15 MG PO TABS: 15.0000 mg | ORAL_TABLET | Freq: Every day | ORAL | 3 refills | Status: AC

## 2024-01-28 NOTE — Assessment & Plan Note (Signed)
 Recent echo July 2025 showed LVEF 45 to 50% with mild global hypokinesis, normal RV function, and normal function of the TAVR prosthesis with a mean gradient of 13 mmHg.

## 2024-01-28 NOTE — Progress Notes (Signed)
 Cardiology Office Note:    Date:  01/28/2024   ID:  Jorge Cherry, DOB 1935-06-18, MRN [id number]  PCP:  Janey Santos, MD   [facility] Providers Cardiologist:  Debby Sor, MD (Inactive) Electrophysiologist:  Will Gladis Norton, MD     Referring MD: Janey Santos, MD   Chief Complaint  Patient presents with   Dizziness    History of Present Illness:    Jorge Cherry is a 88 y.o. male presenting for follow-up evaluation.  The patient has been followed by Dr. Sor for many years.  In 1994 he underwent multivessel CABG.  He later underwent vein graft stenting in 2003.  In addition to coronary artery disease, the patient has a history of mixed hyperlipidemia, hypertension with labile blood pressure, atrial fibrillation, aortic stenosis status post TAVR in 2021, permanent pacemaker placement after TAVR, and obstructive sleep apnea treated with CPAP.  He recently is called in with problems related to leg swelling and he presents today for follow-up evaluation.  The patient is here with his wife and rescue dog today.  He is doing pretty well.  He just saw Dr. Mona a few days ago for evaluation of leg swelling.  He is working on obtaining compression stockings.  Because he has had some intermittent dizziness, carvedilol  was decreased back to 3.125 mg twice daily.  He has had no change in his chronic angina and states that he takes about 2-3 sublingual nitroglycerin  per month.  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  Denies shortness of breath, orthopnea, or PND.  States that he has had some leg swelling ever since his saphenous vein graft harvesting for remote CABG, but that his swelling has been a little worse of late.  Current Medications: Current Meds  Medication Sig   albuterol  (VENTOLIN  HFA) 108 (90 Base) MCG/ACT inhaler USE 2 INHALATIONS BY MOUTH EVERY 4 - 6 HOURS AS NEEDED   amLODipine  (NORVASC ) 10 MG tablet TAKE  1 TABLET BY MOUTH EVERY DAY   atorvastatin  (LIPITOR) 20 MG tablet Take 1 tablet (20 mg total) by mouth at bedtime.   carvedilol  (COREG ) 3.125 MG tablet Take 3.125 mg by mouth 2 (two) times daily with a meal.   DYMISTA  137-50 MCG/ACT SUSP Place 1-2 sprays into both nostrils daily as needed.   ezetimibe  (ZETIA ) 10 MG tablet Take 1 tablet (10 mg total) by mouth daily.   fenofibrate  (TRICOR ) 145 MG tablet TAKE 1/2 TABLET BY MOUTH DAILY   finasteride  (PROSCAR ) 5 MG tablet Take 1 tablet (5 mg total) by mouth daily.   fluticasone  (FLONASE ) 50 MCG/ACT nasal spray Place 2 sprays into both nostrils daily as needed for allergies or rhinitis.    furosemide  (LASIX ) 20 MG tablet Take 1 tablet (20 mg total) by mouth 2 (two) times daily.   ipratropium (ATROVENT ) 0.03 % nasal spray Place 2 sprays into both nostrils 2 (two) times daily.   isosorbide  mononitrate (IMDUR ) 30 MG 24 hr tablet Take 1 tablet (30 mg total) by mouth daily.   KLOR-CON  M20 20 MEQ tablet TAKE 1 TABLET BY MOUTH EVERY DAY   levothyroxine  (SYNTHROID ) 25 MCG tablet Take 25 mcg by mouth daily before breakfast.    loratadine  (CLARITIN ) 10 MG tablet Take 10 mg by mouth as needed for allergies.   Multiple Vitamins-Minerals (CENTRUM SILVER 50+MEN) TABS Take 1 tablet by mouth at bedtime.   nitroGLYCERIN  (NITROLINGUAL ) 0.4 MG/SPRAY spray PLACE 1  SPRAY UNDER THE TONGUE EVERY 5 (FIVE) MINUTES X 3 DOSES AS NEEDED FOR CHEST PAIN.   Omega-3 Fatty Acids  (FISH OIL) 1000 MG CAPS Take 1,000 mg by mouth 2 (two) times daily.    pantoprazole  (PROTONIX ) 40 MG tablet TAKE 1 TABLET BY MOUTH EVERY DAY   ranolazine  (RANEXA ) 500 MG 12 hr tablet TAKE 1 TABLET BY MOUTH TWICE A DAY   RYALTRIS 665-25 MCG/ACT SUSP Place into both nostrils.   vitamin C  (ASCORBIC ACID ) 500 MG tablet Take 500 mg by mouth at bedtime.    vitamin E  400 UNIT capsule Take 400 Units by mouth daily.   zinc  gluconate 50 MG tablet Take 50 mg by mouth daily.   [DISCONTINUED] loratadine  (CLARITIN ) 10 MG  tablet Take 1 tablet (10 mg total) by mouth 2 (two) times daily as needed for allergies (Can use an extra dose during flares).     Allergies:   Ramipril, Guaifenesin er, Azithromycin, and Iodinated contrast media   ROS:   Please see the history of present illness.    All other systems reviewed and are negative.  EKGs/Labs/Other Studies Reviewed:    The following studies were reviewed today: Cardiac Studies & Procedures   ______________________________________________________________________________________________ CARDIAC CATHETERIZATION  CARDIAC CATHETERIZATION 10/18/2019  Conclusion  Ost RCA to Dist RCA lesion is 100% stenosed.  Origin lesion is 100% stenosed.  Ost Cx to Prox Cx lesion is 100% stenosed.  Prox LAD to Mid LAD lesion is 100% stenosed with 100% stenosed side branch in 1st Diag.  Dist Graft lesion is 70% stenosed.  Prox Graft to Mid Graft lesion is 40% stenosed.  Previously placed Origin to Prox Graft stent (unknown type) is widely patent.  Severe native coronary restrictive disease with total occlusion of the proximal LAD prior to any diagonal vessel takeoff; total occlusion of the ostium of the left circumflex coronary artery and total occlusion of the ostium of the native RCA.  Patent LIMA graft supplying the mid LAD.  Patent large SVG supplying the OM 2 vessel with filling of the circumflex to the OM1 vessel with 70% mid AV groove stenosis and evidence for collateralization to the distal RCA.  Patent stent in the proximal SVG supplying the diagonal vessel with mid graft narrowing of 40% and focal 70% distal graft stenosis.  Old occluded graft which had supplied the distal RCA.  Severe aortic stenosis with a mean transvalvular aortic gradient at 41 mm.\  Mild right heart pressure elevation.  RECOMMENDATION: I reviewed the angiographic findings with Dr. Wonda.  Will refer for TAVR consultation.  Medical therapy for his significant CAD.  Patient will  resume Eliquis  tomorrow.  Findings Coronary Findings Diagnostic  Dominance: Right  Left Anterior Descending Prox LAD to Mid LAD lesion is 100% stenosed with 100% stenosed side branch in 1st Diag.  Left Circumflex Ost Cx to Prox Cx lesion is 100% stenosed.  Right Coronary Artery Ost RCA to Dist RCA lesion is 100% stenosed.  Right Posterior Descending Artery Collaterals RPDA filled by collaterals from Dist Cx.  Graft To RPDA Origin lesion is 100% stenosed.  Graft To 2nd Diag Previously placed Origin to Prox Graft stent (unknown type) is widely patent. Prox Graft to Mid Graft lesion is 40% stenosed. Dist Graft lesion is 70% stenosed.  Graft To 2nd Mrg  LIMA Graft To Mid LAD  Intervention  No interventions have been documented.   CARDIAC CATHETERIZATION  CARDIAC CATHETERIZATION 11/24/2016  Conclusion  Prox RCA to Mid RCA lesion, 100 %stenosed.  SVG.  Origin to Insertion lesion, 100 %stenosed.  Prox LAD lesion, 95 %stenosed.  Mid LAD lesion, 100 %stenosed.  SVG.  Origin to Prox Graft lesion, 5 %stenosed.  Mid Graft lesion, 40 %stenosed.  Ost 2nd Diag lesion, 100 %stenosed.  LIMA.  Ost Cx to Prox Cx lesion, 100 %stenosed.  SVG.  Prox Graft lesion, 20 %stenosed.  Hemodynamic findings consistent with mild pulmonary hypertension.  Prox Cx to Mid Cx lesion, 70 %stenosed.  2nd Diag lesion, 30 %stenosed.  Origin lesion, 20 %stenosed.  Preserved global LV function with focal mild mid anterolateral hypocontractility and an ejection fraction of 55%.  Supravalvular aortography demonstrating upper normal aortic root with mild aortic valve calcification with reduced moderately reduced excursion.  No angiographic aortic insufficiency.  Severe native CAD with 95% proximal LAD stenosis just prior to the first diagonal vessel with total occlusion of the LAD after the third septal perforating artery and before the takeoff of the second diagonal branch; total  occlusion of the circumflex at the ostium; and total proximal RCA occlusion.  Patent LIMA graft which supplies the mid LAD, but due to the total occlusion of the LAD after the septal perforating artery, the proximal LAD and diagonal vessel is not supplied by the graft.  Patent vein graft supplying the distal marginal vessel with filling retrograde of the circumflex up to the ostium with 70% mid AV groove stenosis.  There is collateral filling to the distal RCA.  Patent vein graft supplying the second diagonal vessel with 20% ostial narrowing, a patent proximal stent, and diffuse 40% mid graft stenosis.  There is 30% narrowing after the graft anastomosis.  Occluded SVG which had supplied the distal RCA.  RECOMMENDATION: Mr. Liddy has developed increasing symptoms of exertional angina leading to his cardiac catheterization.  The present study only demonstrates mild AS.  Of note, admission laboratory today revealed a significant hemoglobin drop compared to 3 months ago and it is possible that his anemia with hemoglobin of 8 was contributing to his increasing anginal symptomatology.  He has a region of potential proximal LAD ischemia in the territory of the diagonal-1  Vessel due to the 95% stenosis proximally and this segment of the LAD is not supplied by the LIMA graft since the mid LAD os occluded before the graft anastomosis.  His eliquis  has been on hold for several days.  Plan to increase medical therapy.  Hemoglobin/hematocrit and iron studies will be rechecked in a.m. and if further drop Dr. Dyane will need to be contacted for follow-up GI evaluation.  (He had seen him last week).  At present, will defer intervention to the proximal LAD territory.  At present he is not a candidate for triple drug therapy.  He is maintaining sinus rhythm and eliquis  may need to be deferred until  GI evaluation is complete.  Findings Coronary Findings Diagnostic  Dominance: Right  Left Anterior  Descending  Second Diagonal Branch  Left Circumflex  Right Coronary Artery  Right Posterior Descending Artery Collaterals RPDA filled by collaterals from 3rd Mrg.  Saphenous Graft To Dist RCA SVG.  Saphenous Graft To 2nd Diag SVG.  The lesion was previously treated.  LIMA LIMA Graft To Dist LAD LIMA.  Saphenous Graft To 3rd Mrg SVG.  Intervention  No interventions have been documented.   STRESS TESTS  NM MYOCAR MULTI W/SPECT W 01/22/2015  Narrative  There was no ST segment deviation noted during stress.  This is a low risk study.  Findings consistent with  ischemia.  The left ventricular ejection fraction is mildly decreased (45-54%).  Nuclear stress EF: 54%.  Low risk stress nuclear study with a small, medium intensity, reversible defect in the distal inferior lateral wall and apex; findings consistent with mild ischemia; EF 54; mild LVE.   ECHOCARDIOGRAM  ECHOCARDIOGRAM LIMITED 11/30/2023  Narrative ECHOCARDIOGRAM LIMITED REPORT    Patient Name:   Jorge Cherry  Date of Exam: 11/30/2023 Medical Rec #:  [id number]     Height:       68.0 in Accession #:    [id number]    Weight:       183.0 lb Date of Birth:  06-29-1935     BSA:          1.968 m Patient Age:    87 years      BP:           110/60 mmHg Patient Gender: M             HR:           79 bpm. Exam Location:  [address]  Procedure: 2D Echo, Limited Echo, Limited Color Doppler and Cardiac Doppler (Both Spectral and Color Flow Doppler were utilized during procedure).  Indications:    I35.0 Aortic Stenosis  History:        Patient has prior history of Echocardiogram examinations, most recent 06/29/2021. CAD, Pacemaker and AVR-26 mm Edwards Ultra Sapien; Risk Factors:Dyslipidemia and Sleep Apnea. Aortic Valve: 26 mm Sapien prosthetic, stented (TAVR) valve is present in the aortic position.  Sonographer:    Carl Rodgers-Jones RDCS Referring Phys: [id number] THOMAS A  KELLY  IMPRESSIONS   1. Left ventricular ejection fraction, by estimation, is 45 to 50%. The left ventricle has mildly decreased function. The left ventricle demonstrates global hypokinesis. There is mild concentric left ventricular hypertrophy. Left ventricular diastolic function could not be evaluated. 2. Right ventricular systolic function is normal. The right ventricular size is normal. 3. Left atrial size was severely dilated. 4. Right atrial size was mildly dilated. 5. The mitral valve is normal in structure. Trivial mitral valve regurgitation. 6. Tricuspid valve regurgitation is mild to moderate. 7. The aortic valve has been repaired/replaced. Aortic valve regurgitation is not visualized. There is a 26 mm Sapien prosthetic (TAVR) valve present in the aortic position. Echo findings are consistent with normal structure and function of the aortic valve prosthesis. Aortic valve mean gradient measures 12.6 mmHg. Aortic valve Vmax measures 2.32 m/s. Aortic valve acceleration time measures 92 msec. 8. There is mild dilatation of the ascending aorta.  Comparison(s): The TAVR gradients are higher compared to the 2023 study, but may have been underestimated on that study. The prosthetic gradients and acceleration time are very similar to thse reported in 2021.  FINDINGS Left Ventricle: Left ventricular ejection fraction, by estimation, is 45 to 50%. The left ventricle has mildly decreased function. The left ventricle demonstrates global hypokinesis. The left ventricular internal cavity size was normal in size. There is mild concentric left ventricular hypertrophy. Abnormal (paradoxical) septal motion, consistent with RV pacemaker. Left ventricular diastolic function could not be evaluated. Left ventricular diastolic function could not be evaluated due to atrial fibrillation.  Right Ventricle: The right ventricular size is normal. No increase in right ventricular wall thickness. Right ventricular  systolic function is normal.  Left Atrium: Left atrial size was severely dilated.  Right Atrium: Right atrial size was mildly dilated.  Pericardium: There is no evidence of pericardial effusion.  Mitral Valve: The  mitral valve is normal in structure. Trivial mitral valve regurgitation.  Tricuspid Valve: The tricuspid valve is grossly normal. Tricuspid valve regurgitation is mild to moderate.  The aortic valve has been repaired/replaced. Aortic valve regurgitation is not visualized. There is a 26 mm Sapien prosthetic, stented (TAVR) valve present in the aortic position. Echo findings are consistent with normal structure and function of the aortic valve prosthesis. Pulmonic Valve: The pulmonic valve was grossly normal. Pulmonic valve regurgitation is not visualized. No evidence of pulmonic stenosis.  Aorta: The aortic root is normal in size and structure. There is mild dilatation of the ascending aorta.  IAS/Shunts: No atrial level shunt detected by color flow Doppler.  Additional Comments: A device lead is visualized in the right ventricle and right atrium.  LEFT VENTRICLE PLAX 2D LVIDd:         5.50 cm LVIDs:         4.20 cm LV PW:         1.20 cm LV IVS:        1.20 cm   RIGHT VENTRICLE             IVC RV S prime:     10.77 cm/s  IVC diam: 1.70 cm TAPSE (M-mode): 2.6 cm  LEFT ATRIUM         Index LA diam:    5.30 cm 2.69 cm/m AORTIC VALVE AV Vmax:           232.40 cm/s AV Vmean:          165.600 cm/s AV VTI:            0.472 m AV Peak Grad:      21.6 mmHg AV Mean Grad:      12.6 mmHg LVOT Vmax:         98.20 cm/s LVOT Vmean:        61.250 cm/s LVOT VTI:          0.203 m LVOT/AV VTI ratio: 0.43  AORTA Ao Root diam: 3.20 cm Ao Asc diam:  4.20 cm  MV E velocity: 130.50 cm/s  TRICUSPID VALVE TR Peak grad:   28.1 mmHg TR Vmax:        265.00 cm/s  SHUNTS Systemic VTI: 0.20 m  Jerel Croitoru MD Electronically signed by Jerel Balding MD Signature Date/Time:  11/30/2023/4:29:58 PM    Final    MONITORS  LONG TERM MONITOR (3-14 DAYS) 11/19/2019   CT SCANS  CT CORONARY MORPH W/CTA COR W/SCORE 11/06/2019  Addendum 11/07/2019  7:41 AM ADDENDUM REPORT: 11/07/2019 07:39  CLINICAL DATA:  88 year old male with severe aortic stenosis being evaluated for a TAVR procedure.  EXAM: Cardiac TAVR CT  TECHNIQUE: The patient was scanned on a [employer]. A 120 kV retrospective scan was triggered in the descending thoracic aorta at 111 HU's. Gantry rotation speed was 250 msecs and collimation was .6 mm. No beta blockade or nitro were given. The 3D data set was reconstructed in 5% intervals of the R-R cycle. Systolic and diastolic phases were analyzed on a dedicated work station using MPR, MIP and VRT modes. The patient received 80 cc of contrast.  FINDINGS: Aortic Valve: Trileaflet aortic valve with severely calcified leaflet with severe motion restriction and only minimal calcifications extending into the LVOT.  Aorta: Normal size of the thoracic aorta with moderate diffuse atherosclerotic plaque and calcifications and no dissection.  Sinotubular Junction: 34 x 30 mm  Ascending Thoracic Aorta: 37 x 35 mm  Aortic  Arch: 26 x 23 mm  Descending Thoracic Aorta: 28 x 27 mm  Sinus of Valsalva Measurements:  Non-coronary: 38 mm  Right -coronary: 36 mm  Left -coronary: 37 mm  Coronary Artery Height above Annulus:  Left Main: 15 mm  Right Coronary: 15 mm  Virtual Basal Annulus Measurements:  Maximum/Minimum Diameter: 29.3 x 23.4 mm  Mean Diameter: 25.6 mm  Perimeter: 83.1 mm  Area: 514 mm2  Optimum Fluoroscopic Angle for Delivery: LAO 2 CAU 1  IMPRESSION: 1. Trileaflet aortic valve with severely calcified leaflet with severe motion restriction and only minimal calcifications extending into the LVOT. Annular measurements suitable for delivery of a 26 mm Edwards-SAPIEN 3 Ultra valve. Aortic valve calcium  score  3373 consistent with severe aortic stenosis.  2. Sufficient coronary to annulus distance.  3. Optimum Fluoroscopic Angle for Delivery: LAO 2 CAU 1  4. No thrombus in the left atrial appendage.   Electronically Signed By: Leim Moose On: 11/07/2019 07:39  Narrative EXAM: OVER-READ INTERPRETATION  CT CHEST  The following report is an over-read performed by radiologist Dr. Toribio Aye of [facility] on 11/06/2019. This over-read does not include interpretation of cardiac or coronary anatomy or pathology. The coronary calcium  score/coronary CTA interpretation by the cardiologist is attached.  COMPARISON:  None.  FINDINGS: Extracardiac findings will be described separately under dictation for contemporaneously obtained CTA chest, abdomen and pelvis.  IMPRESSION: Please see separate dictation for contemporaneously obtained CTA chest, abdomen and pelvis dated 11/06/2019 for full description of relevant extracardiac findings.  Electronically Signed: By: Toribio Aye M.D. On: 11/06/2019 12:42     ______________________________________________________________________________________________      EKG:        Recent Labs: 10/13/2023: ALT 21; Hemoglobin 11.7; Platelets 161; TSH 3.270 11/30/2023: BUN 34; Creatinine, Ser 1.84; Potassium 4.6; Sodium 147  Recent Lipid Panel    Component Value Date/Time   CHOL 108 07/11/2021 0927   CHOL 137 04/09/2014 1115   TRIG 123 07/11/2021 0927   TRIG 182 (H) 04/09/2014 1115   HDL 38 (L) 07/11/2021 0927   HDL 32 (L) 04/09/2014 1115   CHOLHDL 2.8 07/11/2021 0927   CHOLHDL 3.2 06/28/2021 2349   VLDL 14 06/28/2021 2349   LDLCALC 48 07/11/2021 0927   LDLCALC 69 04/09/2014 1115     Risk Assessment/Calculations:    CHA2DS2-VASc Score = 5   This indicates a 7.2% annual risk of stroke. The patient's score is based upon: CHF History: 1 HTN History: 1 Diabetes History: 0 Stroke History: 0 Vascular Disease  History: 1 Age Score: 2 Gender Score: 0               Physical Exam:    VS:  BP 130/60   Pulse 65   Ht 5' 10 (1.778 m)   Wt 185 lb 3.2 oz (84 kg)   SpO2 95%   BMI 26.57 kg/m     Wt Readings from Last 3 Encounters:  01/28/24 185 lb 3.2 oz (84 kg)  01/24/24 185 lb 14.4 oz (84.3 kg)  01/05/24 185 lb 12.8 oz (84.3 kg)     GEN:  Well nourished, well developed pleasant elderly male in no acute distress HEENT: Normal NECK: No JVD; No carotid bruits LYMPHATICS: No lymphadenopathy CARDIAC: RRR, 2/6 systolic murmur right upper sternal border RESPIRATORY:  Clear to auscultation without rales, wheezing or rhonchi  ABDOMEN: Soft, non-tender, non-distended MUSCULOSKELETAL: 1+ right lower leg edema, trace left lower leg edema; No deformity  SKIN: Warm and dry  NEUROLOGIC:  Alert and oriented x 3 PSYCHIATRIC:  Normal affect   Assessment & Plan Permanent atrial fibrillation (HCC) Tolerating rivaroxaban  without bleeding problems.  Status post permanent pacemaker.  Carvedilol  recently decreased as above. Heart block AV complete (HCC) Status post permanent pacemaker, followed by EP. S/P TAVR (transcatheter aortic valve replacement): November 14, 2019 Recent echo July 2025 showed LVEF 45 to 50% with mild global hypokinesis, normal RV function, and normal function of the TAVR prosthesis with a mean gradient of 13 mmHg. Essential hypertension Blood pressure controlled.  Continue amlodipine , low-dose carvedilol , furosemide , and isosorbide . Coronary artery disease of bypass graft of native heart with stable angina pectoris (HCC) Multidrug antianginal therapy with amlodipine , carvedilol , isosorbide , and ranolazine .  Continue the same.  Reports no change, but understands to seek immediate medical attention if his angina progresses. Hyperlipidemia LDL goal <70 Treated with atorvastatin  and ezetimibe .  LDL cholesterol is 45. Leg edema Right greater than left mild leg edema with history of complete  saphenous vein graft harvesting and amlodipine  usage.  Agree with compression stockings 20 to 30 mmHg.      Medication Adjustments/Labs and Tests Ordered: Current medicines are reviewed at length with the patient today.  Concerns regarding medicines are outlined above.  No orders of the defined types were placed in this encounter.  Meds ordered this encounter  Medications   Rivaroxaban  (XARELTO ) 15 MG TABS tablet    Sig: Take 1 tablet (15 mg total) by mouth daily with supper.    Dispense:  90 tablet    Refill:  3    Patient Instructions  Medication Instructions:  Refill for Xarelto  has been sent to your pharmacy.  *If you need a refill on your cardiac medications before your next appointment, please call your pharmacy*  Lab Work: None ordered today. If you have labs (blood work) drawn today and your tests are completely normal, you will receive your results only by: MyChart Message (if you have MyChart) OR A paper copy in the mail If you have any lab test that is abnormal or we need to change your treatment, we will call you to review the results.  Testing/Procedures: None ordered today.  Follow-Up: At [facility], you and your health needs are our priority.  As part of our continuing mission to provide you with exceptional heart care, our providers are all part of one team.  This team includes your primary Cardiologist (physician) and Advanced Practice Providers or APPs (Physician Assistants and Nurse Practitioners) who all work together to provide you with the care you need, when you need it.  Your next appointment:   6 month(s)  Provider:   Ozell Fell, MD      Signed, Ozell Fell, MD  01/28/2024 4:34 PM     [facility]

## 2024-01-28 NOTE — Assessment & Plan Note (Signed)
 Treated with atorvastatin  and ezetimibe .  LDL cholesterol is 45.

## 2024-01-28 NOTE — Patient Instructions (Signed)
 Medication Instructions:  Refill for Xarelto  has been sent to your pharmacy.  *If you need a refill on your cardiac medications before your next appointment, please call your pharmacy*  Lab Work: None ordered today. If you have labs (blood work) drawn today and your tests are completely normal, you will receive your results only by: MyChart Message (if you have MyChart) OR A paper copy in the mail If you have any lab test that is abnormal or we need to change your treatment, we will call you to review the results.  Testing/Procedures: None ordered today.  Follow-Up: At [facility], you and your health needs are our priority.  As part of our continuing mission to provide you with exceptional heart care, our providers are all part of one team.  This team includes your primary Cardiologist (physician) and Advanced Practice Providers or APPs (Physician Assistants and Nurse Practitioners) who all work together to provide you with the care you need, when you need it.  Your next appointment:   6 month(s)  Provider:   Ozell Fell, MD

## 2024-01-28 NOTE — Assessment & Plan Note (Signed)
 Blood pressure controlled.  Continue amlodipine , low-dose carvedilol , furosemide , and isosorbide .

## 2024-01-28 NOTE — Telephone Encounter (Signed)
 Pt last saw Dr Mona 01/24/24, last labs 11/30/23 Creat 1.84, age 88, weight 84.3kg, CrCl 35, based on CrCl pt is on appropriate dosage of Xarelto  15mg  every day for afib.  Will refill rx.

## 2024-01-28 NOTE — Assessment & Plan Note (Signed)
 Tolerating rivaroxaban  without bleeding problems.  Status post permanent pacemaker.  Carvedilol  recently decreased as above.

## 2024-01-28 NOTE — Assessment & Plan Note (Signed)
 Status post permanent pacemaker, followed by EP.

## 2024-02-03 ENCOUNTER — Other Ambulatory Visit: Payer: Self-pay

## 2024-02-03 MED ORDER — FENOFIBRATE 145 MG PO TABS: 72.5000 mg | ORAL_TABLET | Freq: Every day | ORAL | 3 refills | Status: AC

## 2024-02-10 NOTE — CV Procedure (Signed)
  Device system confirmed to be MRI conditional, with implant date > 6 weeks ago, and no evidence of abandoned or epicardial leads in review of most recent CXR  Device last cleared by EP Provider: Prentice Passey 02/10/24  Clearance is good through for 1 year as long as parameters remain stable at time of check. If pt undergoes a cardiac device procedure during that time, they should be re-cleared.   Tachy-therapies to be programmed off if applicable with device back to pre-MRI settings after completion of exam.  [employer] - Programming recommendation received through [employer] App/Tablet  Rocky Catalan, RT  02/10/2024 8:32 AM

## 2024-02-15 ENCOUNTER — Encounter: Payer: Self-pay | Admitting: Gastroenterology

## 2024-02-15 ENCOUNTER — Ambulatory Visit (HOSPITAL_COMMUNITY)
Admission: RE | Admit: 2024-02-15 | Discharge: 2024-02-15 | Disposition: A | Discharge status: Home / Self Care | Source: Ambulatory Visit | Attending: Gastroenterology | Admitting: Gastroenterology

## 2024-02-15 ENCOUNTER — Other Ambulatory Visit: Payer: Self-pay | Admitting: Gastroenterology

## 2024-02-15 DIAGNOSIS — K862 Cyst of pancreas: Secondary | ICD-10-CM

## 2024-02-15 DIAGNOSIS — K5750 Diverticulosis of both small and large intestine without perforation or abscess without bleeding: Secondary | ICD-10-CM | POA: Diagnosis not present

## 2024-02-15 DIAGNOSIS — N281 Cyst of kidney, acquired: Secondary | ICD-10-CM | POA: Diagnosis not present

## 2024-02-15 MED ORDER — GADOBUTROL 1 MMOL/ML IV SOLN
8.0000 mL | Freq: Once | INTRAVENOUS | Status: AC | PRN
  Administered 2024-02-15: 8 mL via INTRAVENOUS

## 2024-02-15 NOTE — Progress Notes (Signed)
 Patient was monitored by this RN during MRI scan due to presence of a pacemaker. Cardiac rhythm was continuously monitored throughout the procedure. Prior to the start of the scan, the pacemaker was placed in MRI-safe mode by the MRI technician and/or pacemaker representative. Following the completion of the scan, the device was returned to its pre-MRI settings. Neurological status and orientation post-procedure were unchanged from baseline.   Pre-procedure Heart Rate (Prior to being placed in MRI safe mode): 61 Post-procedure Heart Rate (Once pacemaker is returned to baseline mode): 65

## 2024-02-17 ENCOUNTER — Ambulatory Visit: Payer: Medicare PPO

## 2024-02-17 DIAGNOSIS — I442 Atrioventricular block, complete: Secondary | ICD-10-CM | POA: Diagnosis not present

## 2024-02-18 ENCOUNTER — Telehealth: Payer: Self-pay | Admitting: Cardiology

## 2024-02-18 DIAGNOSIS — G4733 Obstructive sleep apnea (adult) (pediatric): Secondary | ICD-10-CM

## 2024-02-18 DIAGNOSIS — I10 Essential (primary) hypertension: Secondary | ICD-10-CM

## 2024-02-18 DIAGNOSIS — I25810 Atherosclerosis of coronary artery bypass graft(s) without angina pectoris: Secondary | ICD-10-CM

## 2024-02-18 LAB — CUP PACEART REMOTE DEVICE CHECK
Battery Remaining Longevity: 114 mo
Battery Voltage: 2.98 V
Brady Statistic RA Percent Paced: 0 %
Brady Statistic RV Percent Paced: 45.15 %
Date Time Interrogation Session: 20251008231527
Implantable Lead Connection Status: 753985
Implantable Lead Connection Status: 753985
Implantable Lead Implant Date: 20210712
Implantable Lead Implant Date: 20210712
Implantable Lead Location: 753859
Implantable Lead Location: 753860
Implantable Lead Model: 5076
Implantable Lead Model: 5076
Implantable Pulse Generator Implant Date: 20210712
Lead Channel Impedance Value: 304 Ohm
Lead Channel Impedance Value: 361 Ohm
Lead Channel Impedance Value: 380 Ohm
Lead Channel Impedance Value: 418 Ohm
Lead Channel Pacing Threshold Amplitude: 0.625 V
Lead Channel Pacing Threshold Amplitude: 0.75 V
Lead Channel Pacing Threshold Pulse Width: 0.4 ms
Lead Channel Pacing Threshold Pulse Width: 0.4 ms
Lead Channel Sensing Intrinsic Amplitude: 0.625 mV
Lead Channel Sensing Intrinsic Amplitude: 0.625 mV
Lead Channel Sensing Intrinsic Amplitude: 10.25 mV
Lead Channel Sensing Intrinsic Amplitude: 10.25 mV
Lead Channel Setting Pacing Amplitude: 2.5 V
Lead Channel Setting Pacing Pulse Width: 0.4 ms
Lead Channel Setting Sensing Sensitivity: 0.9 mV
Zone Setting Status: 755011

## 2024-02-18 NOTE — Telephone Encounter (Signed)
 Pt was seeing Dr Charleen for sleep clinic and it requesting for a new cpap machine and supplies please advise

## 2024-02-18 NOTE — Progress Notes (Signed)
 Remote PPM Transmission

## 2024-02-20 ENCOUNTER — Ambulatory Visit: Payer: Self-pay | Admitting: Cardiology

## 2024-02-21 NOTE — Progress Notes (Signed)
 Remote PPM Transmission

## 2024-02-23 NOTE — Telephone Encounter (Signed)
 Left detailed message on voicemail about his cpap and informed patient to call back to discuss.

## 2024-02-23 NOTE — Telephone Encounter (Signed)
 Order placed to [facility] via community message to Order ResMed auto CPAP from 11-16cm H2O with heated humidity and mask of choice.

## 2024-02-27 ENCOUNTER — Telehealth: Payer: Self-pay | Admitting: Physician Assistant

## 2024-02-27 NOTE — Telephone Encounter (Signed)
 88 yo male with CAD s/p CABG in 1994 and SVG PCI in 2003, AS s/p TAVR, AFib, labile HTN, PPM.  He has had his BP meds adjusted recently. His wife called the answering service with low BPs this AM. His BP was 92/62. He has been dizzy for several weeks. He has not had syncope, chest pain, shortness of breath. No evidence of bleeding. Repeat BP while I was on the phone with her was 127/79.  PLAN: I advised her to have the pt eat his breakfast and check his BP later this morning. If BP > 125, he can take his meds as usual. If running lower than this, he can hold Amlodipine , Coreg , Furosemide . Recheck BP later today before evening meds and take as usual if BP ok. If pt feels worse or BP drops into the 90s again, he can call back. His wife verbalized understanding. Glendia Ferrier, PA-C    02/27/2024 11:00 AM

## 2024-02-28 MED ORDER — AMLODIPINE BESYLATE 5 MG PO TABS: 5.0000 mg | ORAL_TABLET | Freq: Every day | ORAL | 3 refills | Status: AC

## 2024-02-28 NOTE — Telephone Encounter (Signed)
 Called pt and his wife. Relied [employer]. They will pick up the new prescription while out running errands today.

## 2024-02-28 NOTE — Telephone Encounter (Signed)
 Pt's wife calling to state that pt's BP today (at time of call) was 134/79. She would like to know if pt should go back to taking all BP medications or continue to hold them. Please advise

## 2024-02-28 NOTE — Telephone Encounter (Signed)
 Ok to resume meds but let's decrease his Amlodipine  from 10 mg to 5 mg once daily. This should help reduce the chances of low BP again. Glendia Ferrier, PA-C    02/28/2024 2:01 PM

## 2024-02-29 NOTE — Telephone Encounter (Signed)
 Wife is following up. She says patient hasn't taken any BP medication today and BP is still low. She would like to know if he needs to take it at all at this point in the day.  02/29/24: 2:35 PM: 114/69 69 *shortly thereafter*: 113/65 63  *tried different machine*: 111/69

## 2024-02-29 NOTE — Telephone Encounter (Signed)
 Patient's wife is following up. She just wanted to apologize to [place], RN because the call dropped--she didn't mean to hang up the phone. Just FYI.

## 2024-02-29 NOTE — Telephone Encounter (Signed)
 Spoke with pt's wife per [facility] regarding his blood pressure. Pt was not experiencing any symptoms at the time of this call. Wife was given instructions per Glendia Ferrier, PA-C:  PLAN: I advised her to have the pt eat his breakfast and check his BP later this morning. If BP > 125, he can take his meds as usual. If running lower than this, he can hold Amlodipine , Coreg , Furosemide .  Wife verbalized understanding. All questions if any were answered.

## 2024-03-01 DIAGNOSIS — D509 Iron deficiency anemia, unspecified: Secondary | ICD-10-CM | POA: Diagnosis not present

## 2024-03-01 DIAGNOSIS — I129 Hypertensive chronic kidney disease with stage 1 through stage 4 chronic kidney disease, or unspecified chronic kidney disease: Secondary | ICD-10-CM | POA: Diagnosis not present

## 2024-03-01 DIAGNOSIS — I2510 Atherosclerotic heart disease of native coronary artery without angina pectoris: Secondary | ICD-10-CM | POA: Diagnosis not present

## 2024-03-01 DIAGNOSIS — I480 Paroxysmal atrial fibrillation: Secondary | ICD-10-CM | POA: Diagnosis not present

## 2024-03-01 DIAGNOSIS — N1832 Chronic kidney disease, stage 3b: Secondary | ICD-10-CM | POA: Diagnosis not present

## 2024-03-02 LAB — LAB REPORT - SCANNED
Creatinine, POC: 55 mg/dL
EGFR: 46

## 2024-03-03 ENCOUNTER — Other Ambulatory Visit: Payer: Self-pay | Admitting: Home Health

## 2024-03-03 NOTE — Telephone Encounter (Signed)
 Wife is following up. She says she is concerned because [employer] [facility] does not have the order and patient hasn't been able to use CPAP in about 1.5 weeks. Please advise.

## 2024-03-04 ENCOUNTER — Telehealth: Payer: Self-pay

## 2024-03-04 NOTE — Telephone Encounter (Signed)
 Patient's wife called with questions regarding patient's blood pressure. The patient is taking carvedilol , amlodipine  and furosemide . SBP today fluctuating between 127 and 142, with DBP in 70s and 80s. The patient may be feeling dizzy at times, but it is very short in duration and not very frequent. Their main question is whether SBP 127 is too low which I assured them is fine, unless he is symptomatic. They felt reassured and will reach out to their PCP early next or call us  back if there are further questions or concerns.   Jorge LOISE Devonshire, MD, PhD [facility]

## 2024-03-06 NOTE — Telephone Encounter (Signed)
 Wife is calling back, stated no one has called her back yet. States that she needs answers in regards to cpap. Please advise

## 2024-03-13 NOTE — Telephone Encounter (Signed)
 Wife is calling for update, states haven't heard anything. Please advise

## 2024-03-20 ENCOUNTER — Other Ambulatory Visit: Payer: Self-pay

## 2024-03-22 ENCOUNTER — Other Ambulatory Visit: Payer: Self-pay | Admitting: General Practice

## 2024-03-22 MED ORDER — ISOSORBIDE MONONITRATE ER 30 MG PO TB24: 30.0000 mg | ORAL_TABLET | Freq: Every day | ORAL | 1 refills | Status: AC

## 2024-03-22 MED ORDER — RANOLAZINE ER 500 MG PO TB12: 500.0000 mg | ORAL_TABLET | Freq: Two times a day (BID) | ORAL | 3 refills | Status: AC

## 2024-03-24 NOTE — Telephone Encounter (Signed)
 Reached out to patient to give him the number [phone]) to intake to call to get scheduled.

## 2024-05-12 ENCOUNTER — Other Ambulatory Visit: Payer: Self-pay | Admitting: Cardiovascular Disease

## 2024-05-15 MED ORDER — FUROSEMIDE 20 MG PO TABS: 20.0000 mg | ORAL_TABLET | Freq: Two times a day (BID) | ORAL | 2 refills | Status: AC

## 2024-05-18 ENCOUNTER — Ambulatory Visit: Payer: Medicare PPO

## 2024-05-18 DIAGNOSIS — I4821 Permanent atrial fibrillation: Secondary | ICD-10-CM | POA: Diagnosis not present

## 2024-05-19 ENCOUNTER — Ambulatory Visit: Payer: Self-pay | Admitting: Cardiology

## 2024-05-19 LAB — CUP PACEART REMOTE DEVICE CHECK
Battery Remaining Longevity: 109 mo
Battery Voltage: 2.98 V
Brady Statistic RA Percent Paced: 0 %
Brady Statistic RV Percent Paced: 46.69 %
Date Time Interrogation Session: 20260108001242
Implantable Lead Connection Status: 753985
Implantable Lead Connection Status: 753985
Implantable Lead Implant Date: 20210712
Implantable Lead Implant Date: 20210712
Implantable Lead Location: 753859
Implantable Lead Location: 753860
Implantable Lead Model: 5076
Implantable Lead Model: 5076
Implantable Pulse Generator Implant Date: 20210712
Lead Channel Impedance Value: 304 Ohm
Lead Channel Impedance Value: 380 Ohm
Lead Channel Impedance Value: 380 Ohm
Lead Channel Impedance Value: 437 Ohm
Lead Channel Pacing Threshold Amplitude: 0.625 V
Lead Channel Pacing Threshold Amplitude: 0.875 V
Lead Channel Pacing Threshold Pulse Width: 0.4 ms
Lead Channel Pacing Threshold Pulse Width: 0.4 ms
Lead Channel Sensing Intrinsic Amplitude: 1.125 mV
Lead Channel Sensing Intrinsic Amplitude: 1.125 mV
Lead Channel Sensing Intrinsic Amplitude: 13.125 mV
Lead Channel Sensing Intrinsic Amplitude: 13.125 mV
Lead Channel Setting Pacing Amplitude: 2.5 V
Lead Channel Setting Pacing Pulse Width: 0.4 ms
Lead Channel Setting Sensing Sensitivity: 0.9 mV
Zone Setting Status: 755011

## 2024-05-22 ENCOUNTER — Other Ambulatory Visit: Payer: Self-pay

## 2024-05-23 MED ORDER — FINASTERIDE 5 MG PO TABS: 5.0000 mg | ORAL_TABLET | Freq: Every day | ORAL | 2 refills | Status: AC

## 2024-05-23 NOTE — Progress Notes (Signed)
 Remote PPM Transmission
